# Patient Record
Sex: Female | Born: 1965 | Race: White | Hispanic: No | Smoking: Current every day smoker
Health system: Southern US, Community
[De-identification: ages and names within clinical notes are randomized; demographics above are authoritative.]

## PROBLEM LIST (undated history)

## (undated) DIAGNOSIS — Z8673 Personal history of transient ischemic attack (TIA), and cerebral infarction without residual deficits: Secondary | ICD-10-CM

## (undated) DIAGNOSIS — K5090 Crohn's disease, unspecified, without complications: Secondary | ICD-10-CM

## (undated) DIAGNOSIS — Z8489 Family history of other specified conditions: Secondary | ICD-10-CM

## (undated) DIAGNOSIS — J449 Chronic obstructive pulmonary disease, unspecified: Secondary | ICD-10-CM

## (undated) DIAGNOSIS — I4711 Inappropriate sinus tachycardia, so stated: Secondary | ICD-10-CM

## (undated) DIAGNOSIS — F329 Major depressive disorder, single episode, unspecified: Secondary | ICD-10-CM

## (undated) DIAGNOSIS — F419 Anxiety disorder, unspecified: Secondary | ICD-10-CM

## (undated) DIAGNOSIS — F4310 Post-traumatic stress disorder, unspecified: Secondary | ICD-10-CM

## (undated) DIAGNOSIS — J4489 Other specified chronic obstructive pulmonary disease: Secondary | ICD-10-CM

## (undated) DIAGNOSIS — I10 Essential (primary) hypertension: Secondary | ICD-10-CM

## (undated) DIAGNOSIS — Z950 Presence of cardiac pacemaker: Secondary | ICD-10-CM

## (undated) DIAGNOSIS — K3184 Gastroparesis: Secondary | ICD-10-CM

## (undated) DIAGNOSIS — Z87442 Personal history of urinary calculi: Secondary | ICD-10-CM

## (undated) DIAGNOSIS — G4733 Obstructive sleep apnea (adult) (pediatric): Secondary | ICD-10-CM

## (undated) DIAGNOSIS — Z973 Presence of spectacles and contact lenses: Secondary | ICD-10-CM

## (undated) DIAGNOSIS — Z8719 Personal history of other diseases of the digestive system: Secondary | ICD-10-CM

## (undated) DIAGNOSIS — R102 Pelvic and perineal pain: Secondary | ICD-10-CM

## (undated) DIAGNOSIS — M1990 Unspecified osteoarthritis, unspecified site: Secondary | ICD-10-CM

## (undated) DIAGNOSIS — R000 Tachycardia, unspecified: Secondary | ICD-10-CM

## (undated) DIAGNOSIS — Z87898 Personal history of other specified conditions: Secondary | ICD-10-CM

## (undated) DIAGNOSIS — K219 Gastro-esophageal reflux disease without esophagitis: Secondary | ICD-10-CM

## (undated) DIAGNOSIS — E119 Type 2 diabetes mellitus without complications: Secondary | ICD-10-CM

## (undated) DIAGNOSIS — I495 Sick sinus syndrome: Secondary | ICD-10-CM

## (undated) HISTORY — DX: Tachycardia, unspecified: R00.0

## (undated) HISTORY — DX: Essential (primary) hypertension: I10

## (undated) HISTORY — PX: CARDIAC PACEMAKER PLACEMENT: SHX583

## (undated) HISTORY — PX: CARDIAC CATHETERIZATION: SHX172

## (undated) HISTORY — DX: Post-traumatic stress disorder, unspecified: F43.10

## (undated) HISTORY — DX: Inappropriate sinus tachycardia, so stated: I47.11

## (undated) HISTORY — PX: CARDIOVASCULAR STRESS TEST: SHX262

## (undated) HISTORY — DX: Type 2 diabetes mellitus without complications: E11.9

## (undated) HISTORY — PX: OTHER SURGICAL HISTORY: SHX169

## (undated) HISTORY — DX: Personal history of transient ischemic attack (TIA), and cerebral infarction without residual deficits: Z86.73

## (undated) HISTORY — DX: Anxiety disorder, unspecified: F41.9

## (undated) HISTORY — PX: ABDOMINAL HYSTERECTOMY: SHX81

## (undated) HISTORY — PX: MULTIPLE EXTRACTIONS WITH ALVEOLOPLASTY: SHX5342

---

## 1992-09-23 HISTORY — PX: CHOLECYSTECTOMY: SHX55

## 1999-09-07 ENCOUNTER — Encounter (INDEPENDENT_AMBULATORY_CARE_PROVIDER_SITE_OTHER): Payer: Self-pay | Admitting: Specialist

## 1999-09-07 ENCOUNTER — Other Ambulatory Visit: Admission: RE | Admit: 1999-09-07 | Discharge: 1999-09-07 | Payer: Self-pay | Admitting: Otolaryngology

## 1999-09-09 ENCOUNTER — Emergency Department (HOSPITAL_COMMUNITY): Admission: EM | Admit: 1999-09-09 | Discharge: 1999-09-09 | Payer: Self-pay | Admitting: Emergency Medicine

## 2000-01-01 ENCOUNTER — Encounter: Payer: Self-pay | Admitting: Emergency Medicine

## 2000-01-01 ENCOUNTER — Emergency Department (HOSPITAL_COMMUNITY): Admission: EM | Admit: 2000-01-01 | Discharge: 2000-01-01 | Payer: Self-pay | Admitting: Emergency Medicine

## 2001-05-04 ENCOUNTER — Ambulatory Visit (HOSPITAL_COMMUNITY): Admission: RE | Admit: 2001-05-04 | Discharge: 2001-05-04 | Payer: Self-pay | Admitting: Cardiology

## 2001-05-04 ENCOUNTER — Encounter: Payer: Self-pay | Admitting: Cardiology

## 2001-05-06 ENCOUNTER — Encounter: Payer: Self-pay | Admitting: Internal Medicine

## 2001-05-06 ENCOUNTER — Ambulatory Visit (HOSPITAL_COMMUNITY): Admission: RE | Admit: 2001-05-06 | Discharge: 2001-05-06 | Payer: Self-pay | Admitting: Internal Medicine

## 2001-05-07 ENCOUNTER — Ambulatory Visit (HOSPITAL_COMMUNITY): Admission: RE | Admit: 2001-05-07 | Discharge: 2001-05-07 | Payer: Self-pay | Admitting: Cardiology

## 2001-05-07 ENCOUNTER — Encounter: Payer: Self-pay | Admitting: Cardiology

## 2001-05-08 ENCOUNTER — Ambulatory Visit (HOSPITAL_COMMUNITY): Admission: RE | Admit: 2001-05-08 | Discharge: 2001-05-08 | Payer: Self-pay | Admitting: *Deleted

## 2001-05-10 ENCOUNTER — Emergency Department (HOSPITAL_COMMUNITY): Admission: EM | Admit: 2001-05-10 | Discharge: 2001-05-10 | Payer: Self-pay | Admitting: Emergency Medicine

## 2001-06-10 ENCOUNTER — Emergency Department (HOSPITAL_COMMUNITY): Admission: EM | Admit: 2001-06-10 | Discharge: 2001-06-10 | Payer: Self-pay | Admitting: *Deleted

## 2001-06-26 ENCOUNTER — Ambulatory Visit (HOSPITAL_COMMUNITY): Admission: RE | Admit: 2001-06-26 | Discharge: 2001-06-26 | Payer: Self-pay | Admitting: Internal Medicine

## 2001-07-19 ENCOUNTER — Emergency Department (HOSPITAL_COMMUNITY): Admission: EM | Admit: 2001-07-19 | Discharge: 2001-07-19 | Payer: Self-pay | Admitting: Emergency Medicine

## 2001-07-19 ENCOUNTER — Encounter: Payer: Self-pay | Admitting: Emergency Medicine

## 2001-08-17 ENCOUNTER — Ambulatory Visit (HOSPITAL_COMMUNITY): Admission: RE | Admit: 2001-08-17 | Discharge: 2001-08-17 | Payer: Self-pay | Admitting: Internal Medicine

## 2001-10-16 ENCOUNTER — Ambulatory Visit (HOSPITAL_COMMUNITY): Admission: RE | Admit: 2001-10-16 | Discharge: 2001-10-16 | Payer: Self-pay | Admitting: Internal Medicine

## 2001-10-16 ENCOUNTER — Encounter: Payer: Self-pay | Admitting: Internal Medicine

## 2002-06-21 ENCOUNTER — Encounter: Payer: Self-pay | Admitting: Emergency Medicine

## 2002-06-21 ENCOUNTER — Emergency Department (HOSPITAL_COMMUNITY): Admission: EM | Admit: 2002-06-21 | Discharge: 2002-06-21 | Payer: Self-pay | Admitting: Emergency Medicine

## 2002-11-25 ENCOUNTER — Encounter (INDEPENDENT_AMBULATORY_CARE_PROVIDER_SITE_OTHER): Payer: Self-pay | Admitting: *Deleted

## 2002-11-25 ENCOUNTER — Ambulatory Visit (HOSPITAL_BASED_OUTPATIENT_CLINIC_OR_DEPARTMENT_OTHER): Admission: RE | Admit: 2002-11-25 | Discharge: 2002-11-25 | Payer: Self-pay | Admitting: Otolaryngology

## 2002-11-25 HISTORY — PX: TONSILLECTOMY: SUR1361

## 2003-01-05 ENCOUNTER — Encounter (INDEPENDENT_AMBULATORY_CARE_PROVIDER_SITE_OTHER): Payer: Self-pay | Admitting: *Deleted

## 2003-02-22 ENCOUNTER — Ambulatory Visit (HOSPITAL_COMMUNITY): Admission: RE | Admit: 2003-02-22 | Discharge: 2003-02-22 | Payer: Self-pay | Admitting: Internal Medicine

## 2003-03-02 ENCOUNTER — Ambulatory Visit (HOSPITAL_COMMUNITY): Admission: RE | Admit: 2003-03-02 | Discharge: 2003-03-03 | Payer: Self-pay | Admitting: Internal Medicine

## 2003-11-22 HISTORY — PX: OTHER SURGICAL HISTORY: SHX169

## 2004-01-05 ENCOUNTER — Encounter (INDEPENDENT_AMBULATORY_CARE_PROVIDER_SITE_OTHER): Payer: Self-pay | Admitting: *Deleted

## 2004-02-01 ENCOUNTER — Ambulatory Visit (HOSPITAL_COMMUNITY): Admission: RE | Admit: 2004-02-01 | Discharge: 2004-02-01 | Payer: Self-pay | Admitting: Urology

## 2004-02-02 ENCOUNTER — Ambulatory Visit (HOSPITAL_COMMUNITY): Admission: RE | Admit: 2004-02-02 | Discharge: 2004-02-02 | Payer: Self-pay | Admitting: Urology

## 2004-03-14 ENCOUNTER — Ambulatory Visit (HOSPITAL_COMMUNITY): Admission: RE | Admit: 2004-03-14 | Discharge: 2004-03-14 | Payer: Self-pay | Admitting: Orthopedic Surgery

## 2004-03-21 ENCOUNTER — Ambulatory Visit (HOSPITAL_COMMUNITY): Admission: RE | Admit: 2004-03-21 | Discharge: 2004-03-21 | Payer: Self-pay | Admitting: Orthopedic Surgery

## 2004-04-11 ENCOUNTER — Ambulatory Visit (HOSPITAL_COMMUNITY): Admission: RE | Admit: 2004-04-11 | Discharge: 2004-04-11 | Payer: Self-pay | Admitting: Orthopedic Surgery

## 2004-04-11 ENCOUNTER — Ambulatory Visit (HOSPITAL_BASED_OUTPATIENT_CLINIC_OR_DEPARTMENT_OTHER): Admission: RE | Admit: 2004-04-11 | Discharge: 2004-04-11 | Payer: Self-pay | Admitting: Orthopedic Surgery

## 2004-06-26 ENCOUNTER — Encounter: Payer: Self-pay | Admitting: Internal Medicine

## 2004-09-30 ENCOUNTER — Emergency Department (HOSPITAL_COMMUNITY): Admission: EM | Admit: 2004-09-30 | Discharge: 2004-09-30 | Payer: Self-pay | Admitting: Emergency Medicine

## 2004-10-10 ENCOUNTER — Ambulatory Visit (HOSPITAL_BASED_OUTPATIENT_CLINIC_OR_DEPARTMENT_OTHER): Admission: RE | Admit: 2004-10-10 | Discharge: 2004-10-10 | Payer: Self-pay | Admitting: Orthopedic Surgery

## 2004-10-10 ENCOUNTER — Ambulatory Visit (HOSPITAL_COMMUNITY): Admission: RE | Admit: 2004-10-10 | Discharge: 2004-10-10 | Payer: Self-pay | Admitting: Orthopedic Surgery

## 2005-05-04 ENCOUNTER — Emergency Department (HOSPITAL_COMMUNITY): Admission: EM | Admit: 2005-05-04 | Discharge: 2005-05-04 | Payer: Self-pay | Admitting: Emergency Medicine

## 2005-05-17 ENCOUNTER — Emergency Department (HOSPITAL_COMMUNITY): Admission: EM | Admit: 2005-05-17 | Discharge: 2005-05-17 | Payer: Self-pay | Admitting: *Deleted

## 2005-05-29 ENCOUNTER — Ambulatory Visit (HOSPITAL_BASED_OUTPATIENT_CLINIC_OR_DEPARTMENT_OTHER): Admission: RE | Admit: 2005-05-29 | Discharge: 2005-05-29 | Payer: Self-pay | Admitting: Orthopedic Surgery

## 2005-05-29 ENCOUNTER — Ambulatory Visit (HOSPITAL_COMMUNITY): Admission: RE | Admit: 2005-05-29 | Discharge: 2005-05-29 | Payer: Self-pay | Admitting: Orthopedic Surgery

## 2005-06-05 ENCOUNTER — Ambulatory Visit: Payer: Self-pay

## 2005-07-12 ENCOUNTER — Ambulatory Visit (HOSPITAL_BASED_OUTPATIENT_CLINIC_OR_DEPARTMENT_OTHER): Admission: RE | Admit: 2005-07-12 | Discharge: 2005-07-12 | Payer: Self-pay | Admitting: Orthopedic Surgery

## 2005-07-12 ENCOUNTER — Ambulatory Visit (HOSPITAL_COMMUNITY): Admission: RE | Admit: 2005-07-12 | Discharge: 2005-07-12 | Payer: Self-pay | Admitting: Orthopedic Surgery

## 2005-07-16 ENCOUNTER — Ambulatory Visit: Payer: Self-pay | Admitting: Cardiology

## 2005-07-19 ENCOUNTER — Encounter: Payer: Self-pay | Admitting: Internal Medicine

## 2005-08-07 ENCOUNTER — Encounter: Payer: Self-pay | Admitting: Internal Medicine

## 2005-12-16 ENCOUNTER — Ambulatory Visit (HOSPITAL_COMMUNITY): Admission: RE | Admit: 2005-12-16 | Discharge: 2005-12-16 | Payer: Self-pay | Admitting: Orthopedic Surgery

## 2006-06-20 ENCOUNTER — Ambulatory Visit (HOSPITAL_COMMUNITY): Admission: RE | Admit: 2006-06-20 | Discharge: 2006-06-20 | Payer: Self-pay | Admitting: Orthopedic Surgery

## 2006-07-02 ENCOUNTER — Ambulatory Visit: Payer: Self-pay | Admitting: Cardiology

## 2006-07-28 ENCOUNTER — Ambulatory Visit: Payer: Self-pay | Admitting: Cardiology

## 2006-08-18 ENCOUNTER — Ambulatory Visit: Payer: Self-pay | Admitting: Internal Medicine

## 2006-09-30 ENCOUNTER — Ambulatory Visit: Payer: Self-pay | Admitting: Cardiology

## 2006-11-07 ENCOUNTER — Emergency Department (HOSPITAL_COMMUNITY): Admission: EM | Admit: 2006-11-07 | Discharge: 2006-11-07 | Payer: Self-pay | Admitting: Emergency Medicine

## 2006-12-01 ENCOUNTER — Ambulatory Visit: Payer: Self-pay | Admitting: Internal Medicine

## 2006-12-03 ENCOUNTER — Emergency Department (HOSPITAL_COMMUNITY): Admission: EM | Admit: 2006-12-03 | Discharge: 2006-12-03 | Payer: Self-pay | Admitting: Emergency Medicine

## 2007-01-28 ENCOUNTER — Ambulatory Visit: Payer: Self-pay | Admitting: Internal Medicine

## 2007-03-09 ENCOUNTER — Ambulatory Visit (HOSPITAL_COMMUNITY): Admission: RE | Admit: 2007-03-09 | Discharge: 2007-03-09 | Payer: Self-pay | Admitting: Orthopedic Surgery

## 2007-05-14 ENCOUNTER — Emergency Department (HOSPITAL_COMMUNITY): Admission: EM | Admit: 2007-05-14 | Discharge: 2007-05-14 | Payer: Self-pay | Admitting: Emergency Medicine

## 2007-07-16 ENCOUNTER — Ambulatory Visit: Payer: Self-pay | Admitting: Cardiology

## 2007-07-16 ENCOUNTER — Observation Stay (HOSPITAL_COMMUNITY): Admission: AD | Admit: 2007-07-16 | Discharge: 2007-07-17 | Payer: Self-pay | Admitting: Cardiology

## 2007-07-20 ENCOUNTER — Ambulatory Visit: Payer: Self-pay

## 2007-07-29 ENCOUNTER — Ambulatory Visit: Payer: Self-pay | Admitting: Cardiology

## 2007-07-29 ENCOUNTER — Ambulatory Visit: Payer: Self-pay | Admitting: Internal Medicine

## 2007-09-24 DIAGNOSIS — Z950 Presence of cardiac pacemaker: Secondary | ICD-10-CM

## 2007-09-24 HISTORY — DX: Presence of cardiac pacemaker: Z95.0

## 2007-10-15 ENCOUNTER — Ambulatory Visit: Payer: Self-pay | Admitting: Internal Medicine

## 2007-10-16 ENCOUNTER — Ambulatory Visit: Payer: Self-pay | Admitting: Internal Medicine

## 2007-10-16 ENCOUNTER — Ambulatory Visit (HOSPITAL_COMMUNITY): Admission: RE | Admit: 2007-10-16 | Discharge: 2007-10-17 | Payer: Self-pay | Admitting: Internal Medicine

## 2007-10-18 ENCOUNTER — Emergency Department (HOSPITAL_COMMUNITY): Admission: EM | Admit: 2007-10-18 | Discharge: 2007-10-18 | Payer: Self-pay | Admitting: Emergency Medicine

## 2007-10-20 ENCOUNTER — Ambulatory Visit: Payer: Self-pay | Admitting: Internal Medicine

## 2007-10-28 ENCOUNTER — Ambulatory Visit: Payer: Self-pay

## 2007-11-05 ENCOUNTER — Ambulatory Visit: Payer: Self-pay | Admitting: Internal Medicine

## 2007-12-19 ENCOUNTER — Emergency Department (HOSPITAL_COMMUNITY): Admission: EM | Admit: 2007-12-19 | Discharge: 2007-12-19 | Payer: Self-pay | Admitting: Emergency Medicine

## 2008-01-12 ENCOUNTER — Ambulatory Visit: Payer: Self-pay | Admitting: Internal Medicine

## 2008-02-25 ENCOUNTER — Ambulatory Visit: Payer: Self-pay | Admitting: Cardiology

## 2008-02-29 ENCOUNTER — Ambulatory Visit: Payer: Self-pay | Admitting: Cardiology

## 2008-02-29 LAB — CONVERTED CEMR LAB
ALT: 21 units/L (ref 0–35)
Albumin: 3.7 g/dL (ref 3.5–5.2)
HDL: 41.2 mg/dL (ref >39.0)
Total Bilirubin: 0.6 mg/dL (ref 0.3–1.2)
Triglycerides: 147 mg/dL (ref 0–149)
VLDL: 29 mg/dL (ref 0–40)

## 2008-04-25 ENCOUNTER — Ambulatory Visit (HOSPITAL_COMMUNITY): Admission: RE | Admit: 2008-04-25 | Discharge: 2008-04-25 | Payer: Self-pay | Admitting: Orthopedic Surgery

## 2008-05-05 ENCOUNTER — Encounter: Admission: RE | Admit: 2008-05-05 | Discharge: 2008-06-20 | Payer: Self-pay | Admitting: Orthopedic Surgery

## 2008-09-01 ENCOUNTER — Ambulatory Visit: Payer: Self-pay | Admitting: Physician Assistant

## 2008-09-08 ENCOUNTER — Encounter (INDEPENDENT_AMBULATORY_CARE_PROVIDER_SITE_OTHER): Payer: Self-pay | Admitting: *Deleted

## 2008-09-08 ENCOUNTER — Emergency Department (HOSPITAL_COMMUNITY): Admission: EM | Admit: 2008-09-08 | Discharge: 2008-09-08 | Payer: Self-pay | Admitting: Emergency Medicine

## 2008-09-26 ENCOUNTER — Encounter: Admission: RE | Admit: 2008-09-26 | Discharge: 2008-12-25 | Payer: Self-pay | Admitting: Physician Assistant

## 2008-09-30 ENCOUNTER — Ambulatory Visit: Payer: Self-pay | Admitting: Cardiology

## 2008-10-04 ENCOUNTER — Encounter: Payer: Self-pay | Admitting: Gastroenterology

## 2008-10-05 ENCOUNTER — Telehealth: Payer: Self-pay | Admitting: Gastroenterology

## 2008-10-05 ENCOUNTER — Ambulatory Visit: Payer: Self-pay | Admitting: Gastroenterology

## 2008-10-17 ENCOUNTER — Telehealth: Payer: Self-pay | Admitting: Gastroenterology

## 2008-10-18 ENCOUNTER — Ambulatory Visit: Payer: Self-pay | Admitting: Gastroenterology

## 2008-10-18 ENCOUNTER — Telehealth: Payer: Self-pay | Admitting: Gastroenterology

## 2008-10-18 ENCOUNTER — Encounter: Payer: Self-pay | Admitting: Gastroenterology

## 2008-10-18 ENCOUNTER — Ambulatory Visit (HOSPITAL_COMMUNITY): Admission: RE | Admit: 2008-10-18 | Discharge: 2008-10-18 | Payer: Self-pay | Admitting: Gastroenterology

## 2008-10-21 ENCOUNTER — Ambulatory Visit (HOSPITAL_COMMUNITY): Admission: RE | Admit: 2008-10-21 | Discharge: 2008-10-21 | Payer: Self-pay | Admitting: Gastroenterology

## 2008-10-21 ENCOUNTER — Encounter: Payer: Self-pay | Admitting: Gastroenterology

## 2008-10-24 ENCOUNTER — Telehealth: Payer: Self-pay | Admitting: Gastroenterology

## 2008-10-26 ENCOUNTER — Ambulatory Visit: Payer: Self-pay | Admitting: Gastroenterology

## 2008-10-26 ENCOUNTER — Telehealth: Payer: Self-pay | Admitting: Gastroenterology

## 2008-10-27 ENCOUNTER — Telehealth: Payer: Self-pay | Admitting: Gastroenterology

## 2008-11-02 ENCOUNTER — Observation Stay (HOSPITAL_COMMUNITY): Admission: RE | Admit: 2008-11-02 | Discharge: 2008-11-02 | Payer: Self-pay | Admitting: Gastroenterology

## 2008-11-02 ENCOUNTER — Encounter: Payer: Self-pay | Admitting: Gastroenterology

## 2008-11-23 ENCOUNTER — Ambulatory Visit: Payer: Self-pay | Admitting: Internal Medicine

## 2008-12-02 ENCOUNTER — Telehealth: Payer: Self-pay | Admitting: Gastroenterology

## 2008-12-12 ENCOUNTER — Ambulatory Visit: Payer: Self-pay | Admitting: Gastroenterology

## 2008-12-12 LAB — CONVERTED CEMR LAB
BUN: 9 mg/dL (ref 6–23)
Creatinine, Ser: 0.7 mg/dL (ref 0.4–1.2)

## 2008-12-14 ENCOUNTER — Encounter: Payer: Self-pay | Admitting: Gastroenterology

## 2008-12-14 ENCOUNTER — Ambulatory Visit: Payer: Self-pay | Admitting: Cardiology

## 2008-12-30 ENCOUNTER — Encounter (INDEPENDENT_AMBULATORY_CARE_PROVIDER_SITE_OTHER): Payer: Self-pay | Admitting: Radiology

## 2009-01-31 ENCOUNTER — Telehealth: Payer: Self-pay | Admitting: Internal Medicine

## 2009-02-02 ENCOUNTER — Ambulatory Visit: Payer: Self-pay | Admitting: Internal Medicine

## 2009-02-15 ENCOUNTER — Ambulatory Visit: Payer: Self-pay | Admitting: Cardiology

## 2009-02-22 ENCOUNTER — Encounter (INDEPENDENT_AMBULATORY_CARE_PROVIDER_SITE_OTHER): Payer: Self-pay | Admitting: *Deleted

## 2009-03-01 ENCOUNTER — Encounter (INDEPENDENT_AMBULATORY_CARE_PROVIDER_SITE_OTHER): Payer: Self-pay | Admitting: *Deleted

## 2009-03-01 LAB — CONVERTED CEMR LAB
ALT: 31 units/L
AST: 23 units/L
Albumin: 4.2 g/dL
Alkaline Phosphatase: 107 units/L
Total Protein: 7.1 g/dL
Triglycerides: 355 mg/dL

## 2009-03-06 ENCOUNTER — Encounter: Payer: Self-pay | Admitting: Physician Assistant

## 2009-03-15 ENCOUNTER — Telehealth: Payer: Self-pay | Admitting: Gastroenterology

## 2009-03-16 ENCOUNTER — Ambulatory Visit: Payer: Self-pay | Admitting: Gastroenterology

## 2009-03-16 DIAGNOSIS — K3184 Gastroparesis: Secondary | ICD-10-CM

## 2009-03-16 DIAGNOSIS — K219 Gastro-esophageal reflux disease without esophagitis: Secondary | ICD-10-CM | POA: Insufficient documentation

## 2009-03-21 ENCOUNTER — Telehealth: Payer: Self-pay | Admitting: Gastroenterology

## 2009-03-24 ENCOUNTER — Encounter: Payer: Self-pay | Admitting: Cardiology

## 2009-04-11 ENCOUNTER — Telehealth: Payer: Self-pay | Admitting: Gastroenterology

## 2009-04-13 ENCOUNTER — Telehealth: Payer: Self-pay | Admitting: Gastroenterology

## 2009-04-17 ENCOUNTER — Ambulatory Visit: Payer: Self-pay | Admitting: Gastroenterology

## 2009-04-24 ENCOUNTER — Telehealth (INDEPENDENT_AMBULATORY_CARE_PROVIDER_SITE_OTHER): Payer: Self-pay | Admitting: *Deleted

## 2009-05-09 ENCOUNTER — Ambulatory Visit: Payer: Self-pay | Admitting: Gastroenterology

## 2009-05-10 ENCOUNTER — Ambulatory Visit: Payer: Self-pay | Admitting: Gastroenterology

## 2009-05-10 ENCOUNTER — Encounter: Payer: Self-pay | Admitting: Gastroenterology

## 2009-05-11 ENCOUNTER — Ambulatory Visit: Payer: Self-pay | Admitting: Cardiology

## 2009-05-11 DIAGNOSIS — R079 Chest pain, unspecified: Secondary | ICD-10-CM | POA: Insufficient documentation

## 2009-05-11 DIAGNOSIS — I10 Essential (primary) hypertension: Secondary | ICD-10-CM

## 2009-05-18 ENCOUNTER — Ambulatory Visit: Payer: Self-pay | Admitting: Cardiology

## 2009-05-18 ENCOUNTER — Encounter (HOSPITAL_COMMUNITY): Admission: RE | Admit: 2009-05-18 | Discharge: 2009-06-17 | Payer: Self-pay | Admitting: Cardiology

## 2009-05-22 ENCOUNTER — Encounter: Payer: Self-pay | Admitting: Cardiology

## 2009-05-23 ENCOUNTER — Encounter: Payer: Self-pay | Admitting: Gastroenterology

## 2009-05-26 ENCOUNTER — Ambulatory Visit (HOSPITAL_COMMUNITY): Admission: RE | Admit: 2009-05-26 | Discharge: 2009-05-26 | Payer: Self-pay | Admitting: Orthopedic Surgery

## 2009-06-28 ENCOUNTER — Encounter: Payer: Self-pay | Admitting: Gastroenterology

## 2009-06-28 DIAGNOSIS — K222 Esophageal obstruction: Secondary | ICD-10-CM

## 2009-07-06 ENCOUNTER — Encounter: Payer: Self-pay | Admitting: Cardiology

## 2009-07-10 ENCOUNTER — Telehealth: Payer: Self-pay | Admitting: Gastroenterology

## 2009-07-25 ENCOUNTER — Telehealth: Payer: Self-pay | Admitting: Gastroenterology

## 2009-08-14 ENCOUNTER — Ambulatory Visit (HOSPITAL_COMMUNITY): Admission: RE | Admit: 2009-08-14 | Discharge: 2009-08-14 | Payer: Self-pay | Admitting: Orthopedic Surgery

## 2009-08-24 ENCOUNTER — Ambulatory Visit: Payer: Self-pay | Admitting: Gastroenterology

## 2009-08-24 ENCOUNTER — Telehealth: Payer: Self-pay | Admitting: Gastroenterology

## 2009-08-25 ENCOUNTER — Telehealth: Payer: Self-pay | Admitting: Gastroenterology

## 2009-08-25 ENCOUNTER — Telehealth (INDEPENDENT_AMBULATORY_CARE_PROVIDER_SITE_OTHER): Payer: Self-pay | Admitting: *Deleted

## 2009-08-29 ENCOUNTER — Ambulatory Visit: Payer: Self-pay | Admitting: Internal Medicine

## 2009-08-29 DIAGNOSIS — Z950 Presence of cardiac pacemaker: Secondary | ICD-10-CM

## 2009-09-04 ENCOUNTER — Telehealth: Payer: Self-pay | Admitting: Gastroenterology

## 2009-09-12 ENCOUNTER — Ambulatory Visit: Payer: Self-pay | Admitting: Internal Medicine

## 2009-09-12 ENCOUNTER — Telehealth: Payer: Self-pay | Admitting: Gastroenterology

## 2009-09-27 ENCOUNTER — Ambulatory Visit: Payer: Self-pay | Admitting: Gastroenterology

## 2009-09-28 ENCOUNTER — Telehealth: Payer: Self-pay | Admitting: Gastroenterology

## 2009-09-29 ENCOUNTER — Encounter: Payer: Self-pay | Admitting: Gastroenterology

## 2009-10-06 ENCOUNTER — Telehealth: Payer: Self-pay | Admitting: Gastroenterology

## 2009-10-11 ENCOUNTER — Telehealth: Payer: Self-pay | Admitting: Gastroenterology

## 2009-10-18 ENCOUNTER — Ambulatory Visit: Payer: Self-pay | Admitting: Vascular Surgery

## 2009-10-18 ENCOUNTER — Emergency Department (HOSPITAL_COMMUNITY): Admission: EM | Admit: 2009-10-18 | Discharge: 2009-10-18 | Payer: Self-pay | Admitting: Emergency Medicine

## 2009-10-18 ENCOUNTER — Encounter (INDEPENDENT_AMBULATORY_CARE_PROVIDER_SITE_OTHER): Payer: Self-pay | Admitting: Emergency Medicine

## 2009-10-23 ENCOUNTER — Ambulatory Visit: Payer: Self-pay | Admitting: Gastroenterology

## 2009-10-23 DIAGNOSIS — K589 Irritable bowel syndrome without diarrhea: Secondary | ICD-10-CM | POA: Insufficient documentation

## 2009-10-31 ENCOUNTER — Telehealth (INDEPENDENT_AMBULATORY_CARE_PROVIDER_SITE_OTHER): Payer: Self-pay | Admitting: *Deleted

## 2009-11-02 ENCOUNTER — Encounter: Payer: Self-pay | Admitting: Internal Medicine

## 2009-11-03 ENCOUNTER — Ambulatory Visit: Payer: Self-pay | Admitting: Internal Medicine

## 2009-11-03 DIAGNOSIS — R0989 Other specified symptoms and signs involving the circulatory and respiratory systems: Secondary | ICD-10-CM | POA: Insufficient documentation

## 2009-11-03 DIAGNOSIS — F17200 Nicotine dependence, unspecified, uncomplicated: Secondary | ICD-10-CM | POA: Insufficient documentation

## 2009-11-03 DIAGNOSIS — R0609 Other forms of dyspnea: Secondary | ICD-10-CM | POA: Insufficient documentation

## 2009-11-03 DIAGNOSIS — J441 Chronic obstructive pulmonary disease with (acute) exacerbation: Secondary | ICD-10-CM | POA: Insufficient documentation

## 2009-11-07 ENCOUNTER — Telehealth: Payer: Self-pay | Admitting: Internal Medicine

## 2009-11-13 ENCOUNTER — Ambulatory Visit: Payer: Self-pay | Admitting: Internal Medicine

## 2009-11-13 ENCOUNTER — Encounter (INDEPENDENT_AMBULATORY_CARE_PROVIDER_SITE_OTHER): Payer: Self-pay | Admitting: *Deleted

## 2009-11-14 ENCOUNTER — Telehealth (INDEPENDENT_AMBULATORY_CARE_PROVIDER_SITE_OTHER): Payer: Self-pay | Admitting: *Deleted

## 2009-11-14 ENCOUNTER — Telehealth: Payer: Self-pay | Admitting: Internal Medicine

## 2009-11-14 DIAGNOSIS — G4733 Obstructive sleep apnea (adult) (pediatric): Secondary | ICD-10-CM | POA: Insufficient documentation

## 2009-11-15 ENCOUNTER — Encounter: Payer: Self-pay | Admitting: Adult Health

## 2009-11-17 ENCOUNTER — Telehealth (INDEPENDENT_AMBULATORY_CARE_PROVIDER_SITE_OTHER): Payer: Self-pay | Admitting: *Deleted

## 2009-11-20 ENCOUNTER — Ambulatory Visit: Payer: Self-pay | Admitting: Cardiology

## 2009-11-20 DIAGNOSIS — E782 Mixed hyperlipidemia: Secondary | ICD-10-CM | POA: Insufficient documentation

## 2009-12-05 ENCOUNTER — Telehealth (INDEPENDENT_AMBULATORY_CARE_PROVIDER_SITE_OTHER): Payer: Self-pay | Admitting: *Deleted

## 2009-12-05 ENCOUNTER — Ambulatory Visit (HOSPITAL_COMMUNITY): Admission: RE | Admit: 2009-12-05 | Discharge: 2009-12-05 | Payer: Self-pay | Admitting: Urology

## 2009-12-11 ENCOUNTER — Ambulatory Visit: Payer: Self-pay | Admitting: Internal Medicine

## 2009-12-11 DIAGNOSIS — J449 Chronic obstructive pulmonary disease, unspecified: Secondary | ICD-10-CM

## 2009-12-11 DIAGNOSIS — J4489 Other specified chronic obstructive pulmonary disease: Secondary | ICD-10-CM | POA: Insufficient documentation

## 2009-12-18 ENCOUNTER — Encounter: Payer: Self-pay | Admitting: Cardiology

## 2009-12-19 ENCOUNTER — Telehealth: Payer: Self-pay | Admitting: Internal Medicine

## 2009-12-20 ENCOUNTER — Encounter (INDEPENDENT_AMBULATORY_CARE_PROVIDER_SITE_OTHER): Payer: Self-pay | Admitting: *Deleted

## 2009-12-21 ENCOUNTER — Telehealth: Payer: Self-pay | Admitting: Gastroenterology

## 2009-12-22 ENCOUNTER — Encounter: Payer: Self-pay | Admitting: Internal Medicine

## 2009-12-22 ENCOUNTER — Telehealth (INDEPENDENT_AMBULATORY_CARE_PROVIDER_SITE_OTHER): Payer: Self-pay | Admitting: *Deleted

## 2009-12-25 ENCOUNTER — Encounter: Payer: Self-pay | Admitting: Internal Medicine

## 2009-12-25 ENCOUNTER — Ambulatory Visit: Payer: Self-pay | Admitting: Gastroenterology

## 2010-01-04 ENCOUNTER — Telehealth: Payer: Self-pay | Admitting: Gastroenterology

## 2010-02-21 ENCOUNTER — Encounter: Payer: Self-pay | Admitting: Gastroenterology

## 2010-02-22 ENCOUNTER — Encounter (INDEPENDENT_AMBULATORY_CARE_PROVIDER_SITE_OTHER): Payer: Self-pay | Admitting: *Deleted

## 2010-03-30 ENCOUNTER — Telehealth: Payer: Self-pay | Admitting: Internal Medicine

## 2010-03-30 ENCOUNTER — Encounter: Payer: Self-pay | Admitting: Internal Medicine

## 2010-05-04 ENCOUNTER — Encounter (INDEPENDENT_AMBULATORY_CARE_PROVIDER_SITE_OTHER): Payer: Self-pay | Admitting: *Deleted

## 2010-05-14 ENCOUNTER — Emergency Department (HOSPITAL_COMMUNITY): Admission: EM | Admit: 2010-05-14 | Discharge: 2010-05-14 | Payer: Self-pay | Admitting: Emergency Medicine

## 2010-05-30 ENCOUNTER — Telehealth: Payer: Self-pay | Admitting: Gastroenterology

## 2010-07-26 ENCOUNTER — Emergency Department (HOSPITAL_COMMUNITY): Admission: EM | Admit: 2010-07-26 | Discharge: 2010-07-26 | Payer: Self-pay | Admitting: Emergency Medicine

## 2010-08-02 ENCOUNTER — Ambulatory Visit: Payer: Self-pay | Admitting: Cardiology

## 2010-08-22 ENCOUNTER — Ambulatory Visit: Payer: Self-pay | Admitting: Cardiology

## 2010-08-22 ENCOUNTER — Telehealth: Payer: Self-pay | Admitting: Internal Medicine

## 2010-08-22 DIAGNOSIS — E1159 Type 2 diabetes mellitus with other circulatory complications: Secondary | ICD-10-CM

## 2010-08-23 ENCOUNTER — Encounter: Payer: Self-pay | Admitting: Cardiology

## 2010-08-27 ENCOUNTER — Telehealth: Payer: Self-pay | Admitting: Adult Health

## 2010-09-06 ENCOUNTER — Encounter (INDEPENDENT_AMBULATORY_CARE_PROVIDER_SITE_OTHER): Payer: Self-pay | Admitting: *Deleted

## 2010-09-12 ENCOUNTER — Emergency Department (HOSPITAL_COMMUNITY)
Admission: EM | Admit: 2010-09-12 | Discharge: 2010-09-12 | Payer: Self-pay | Source: Home / Self Care | Admitting: Emergency Medicine

## 2010-10-10 ENCOUNTER — Telehealth: Payer: Self-pay | Admitting: Gastroenterology

## 2010-10-14 ENCOUNTER — Encounter: Payer: Self-pay | Admitting: Gastroenterology

## 2010-10-16 ENCOUNTER — Ambulatory Visit: Admit: 2010-10-16 | Payer: Self-pay | Admitting: Internal Medicine

## 2010-10-21 LAB — CONVERTED CEMR LAB
Basophils Relative: 0.1 % (ref 0.0–1.0)
CO2: 27 meq/L (ref 19–32)
Eosinophils Relative: 2 % (ref 0.0–5.0)
GFR calc Af Amer: 119 mL/min
Glucose, Bld: 183 mg/dL — ABNORMAL HIGH (ref 70–99)
HCT: 44.7 % (ref 36.0–46.0)
Hemoglobin: 15.5 g/dL — ABNORMAL HIGH (ref 12.0–15.0)
Lymphocytes Relative: 39 % (ref 12.0–46.0)
Monocytes Absolute: 0.6 10*3/uL (ref 0.2–0.7)
Neutro Abs: 4.6 10*3/uL (ref 1.4–7.7)
Potassium: 4.4 meq/L (ref 3.5–5.1)
Prothrombin Time: 11.7 s (ref 10.9–13.3)
TSH: 0.62 microintl units/mL (ref 0.35–5.50)
WBC: 8.8 10*3/uL (ref 4.5–10.5)

## 2010-10-25 NOTE — Progress Notes (Signed)
Summary: diag  Phone Note Call from Patient Call back at Home Phone [phone]   Caller: Patient Call For: Jolita Haefner Reason for Call: Talk to Nurse Summary of Call: pt would like nurse to call her, said that she wasn't told what was wrong with her at her visit and she would like to know ehat MR's findings were. Initial call taken by: Eugene Gavia,  November 07, 2009 3:16 PM  Follow-up for Phone Call        told pt according to OV note she has COPD. She states she saw her PMD today and he asked why she was not on oxygen. I advised pt that was why we walked her was to check her oxygen level and she did not drop, so according to that she does not need oxygen at this time. She also states that she is having trouble sleeping. This was also mentioned in last OV note and staes will f/u at next OV. Pt states she used to be on CPAP, but stopped it because it "scared" her?. She staets she had a sleep study at [facility], I advised to get a copy of this study as well as Xrays from hospital visit the sent her to see [place] and bring them by for MR to review. Pt states she will do so. She also states her PMD changed some of her meds. I advised she is scheduled to see TP on 2/21 for med calender and she needs to bring all of her meds then and TP will fix her med list. pt states understanding.Carron Curie CMA  November 07, 2009 3:38 PM

## 2010-10-25 NOTE — Letter (Signed)
Summary: Appointment - Reschedule  [facility], Main Office  [address]   [place], [place] [address]   Phone: [phone]  Fax: [phone]     September 06, 2010 MRN: [id number]   [name] [address] [place], [place]  [address]   Dear [name],   Due to a change in our office schedule, your appointment on  1.24.12 with Dr. [name],  must be changed.  It is very important that we reach you to reschedule this appointment. We look forward to participating in your health care needs. Please contact us at the number listed above at your earliest convenience to reschedule this appointment.     Sincerely,  Glass blower/designer

## 2010-10-25 NOTE — Progress Notes (Signed)
Summary: [facility]   Imported By: Sherian Rein 04/04/2010 10:34:07  _____________________________________________________________________  External Attachment:    Type:   Image     Comment:   External Document

## 2010-10-25 NOTE — Letter (Signed)
Summary: Appointment - Missed  [facility], Main Office  [address]   [place], [place] [address]   Phone: [phone]  Fax: [phone]     February 22, 2010 MRN: [id number]   [name] [address] [place], [place]  [address]   Dear Ms. Tata,  Our records indicate you missed your appointment on 02-13-10 with Dr [name]. It is very important that we reach you to reschedule this appointment. We look forward to participating in your health care needs. Please contact us at the number listed above at your earliest convenience to reschedule this appointment.     Sincerely,   Ruel Favors Scheduling Team

## 2010-10-25 NOTE — Assessment & Plan Note (Signed)
Summary: [facility]   Visit Type:  Follow-up Primary Provider:  Dr. Lia Hopping   History of Present Illness: 45 year old woman presents for followup. I last saw her back in February of this year. She was recently admitted to [facility] with chest pain and shortness of breath. She ruled out for myocardial infarction by cardiac markers and underwent a Lexus scan Cardiolite on 10 November that demonstrated no evidence of ischemia with LV EF 56%. Cardiology was not involved in her hospital stay. She had very poor blood sugar control with glucose in the 300s at presentation, although she states that she has been compliant with her medications. Potassium was noted to be 3.2, reportedly not on Lasix, although she has used this as needed for lower extremity swelling.  She states that she is due to see Dr. Olena Leatherwood back in the office next week. She has not had regular followup with Pulmonary medicine.  I reviewed the patient's recent stress test results with her. I continue to recommend better efforts at aggressive risk factor modification. She has never had any objective evidence of obstructive CAD based on a multitude of testing over the years.  Current Medications (verified): 1)  Ipratropium Bromide 0.06 % Soln (Ipratropium Bromide) .... Inhale 1 Vial Via Hhn Four Times A Day 2)  Pindolol 10 Mg Tabs (Pindolol) .... Take 1 Tab Two Times A Day 3)  Aspirin Ec 325 Mg Tbec (Aspirin) .... Take 1 Tab By Mouth At Bedtime 4)  Lasix 20 Mg Tabs (Furosemide) .... Once Daily and As Needed As Needed Swelling 5)  Zocor 20 Mg Tabs (Simvastatin) .Marland Kitchen.. 1 At Bedtime 6)  Niaspan 1000 Mg Cr-Tabs (Niacin (Antihyperlipidemic)) .... Take 1 Tab By Mouth At Bedtime 7)  Glucovance 5-500 Mg Tabs (Glyburide-Metformin) .Marland Kitchen.. 1 Tablet By Mouth Two Times A Day 8)  Zegerid 20-1100 Mg Caps (Omeprazole-Sodium Bicarbonate) .... Take 1 Capsule By Mouth Two Times A Day 30 Minutes Before Meal 9)  Metoclopramide Hcl 10 Mg  Tabs (Metoclopramide Hcl) .... Take One By Mouth 30 Minutes Before Breakfast, Lunch, Dinner and At Bedtime. 10)  Neurontin 300 Mg Caps (Gabapentin) .... Three Times A Day 11)  Zyrtec Allergy 10 Mg Tabs (Cetirizine Hcl) .Marland Kitchen.. 1 Tab By Mouth At Bedtime 12)  Tizanidine Hcl 4 Mg Tabs (Tizanidine Hcl) .... 3 Tabs By Mouth At Bedtime 13)  Levemir 100 Unit/ml Soln (Insulin Detemir) .... 40 Units With Breakfast and At Bedtime 14)  Novolog 100 Unit/ml Soln (Insulin Aspart) .Marland Kitchen.. 15 Units Before Each Meal 15)  Nitro-Dur 0.4 Mg/hr Pt24 (Nitroglycerin) .... As Needed 16)  Albuterol Sulfate (2.5 Mg/44ml) 0.083% Nebu (Albuterol Sulfate) .Marland Kitchen.. 1 Neb Via Hhn Every 4 Hours As Needed 17)  Ventolin Hfa 108 (90 Base) Mcg/act Aers (Albuterol Sulfate) .... 2 Puffs Every 4 Hours As Needed 18)  Methocarbamol 750 Mg Tabs (Methocarbamol) .Marland Kitchen.. 1 Every 8 Hours As Needed 19)  Hyomax-Sr 0.375 Mg Xr12h-Tab (Hyoscyamine Sulfate) .... Takes One Tab Twice A Day As Needed For Abdominal Pain 20)  Promethazine Hcl 25 Mg Tabs (Promethazine Hcl) .... Take One By Mouth Every 4 Hours As Needed For Nausea/vomiting. 21)  Lomotil 2.5-0.025 Mg Tabs (Diphenoxylate-Atropine) .Marland Kitchen.. 1 Tab By Mouth Every 8 Hours As Needed 22)  Oxygen .... 2 L At Bedtime 23)  Byetta 10 Mcg Pen 10 Mcg/0.69ml Soln (Exenatide) .Marland Kitchen.. 10 Units Before Meals 24)  Advair Hfa 115-21 Mcg/act Aero (Fluticasone-Salmeterol) .... Take 2 Puffs Two Times A Day 25)  Imdur 30 Mg Xr24h-Tab (Isosorbide  Mononitrate) .... Take 1 Tab Daily 26)  Potassium Chloride 20 Meq Pack (Potassium Chloride) .... Take 1 Tab Daily  Allergies: 1)  ! 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 [name]   Review of Systems       The patient complains of dyspnea on exertion and peripheral edema.  The patient denies anorexia, fever, prolonged cough, headaches, hemoptysis, melena, and hematochezia.         Reports no recent chest pain. Otherwise reviewed and negative.  Vital Signs:  Patient profile:   45 year old female Weight:      200 pounds BMI:     31.44 Pulse rate:   97 / minute BP sitting:   171 / 90  (right arm)  Vitals Entered By: Dreama Saa, CNA (August 22, 2010 1:53 PM)  Physical Exam  Additional Exam:  Obese woman in no acute distress. HEENT: Conjunctiva and lids normal, oropharynx with poor dentition. Neck: Supple, no elevated jugular venous pressure or carotid bruits. Lungs: Diminished breath sounds, no wheezing, and nonlabored. Cardiac: Regular rate and rhythm, no S3. Abdomen: Soft, nontender, bowel sounds present. Extremities: 1+ edema below the knees with venous stasis, distal pulses one plus. Skin: Warm and dry. Musculoskeletal: No gross deformities. Neuropsychiatric: Alert and oriented x3, affect appropriate.   Nuclear Study  Procedure date:  05/18/2009  Findings:      Scintigraphic Data: Analysis of the raw perfusion data shows   somewhat limited radiotracer uptake, affected by body habitus, and   breast attenuation/soft tissue attenuation artifact.    Tomographic views were obtained using the short axis, vertical long   axis, and horizontal long axis planes.  There are small, mild   intensity, defects in the apical anteroseptal and mid inferior    wall, consistent with attenuation artifact.  These areas are fixed.   There are no large reversible perfusion defects to suggest   ischemia.    Gated imaging reveals a left ventricular ejection fraction of 59%   without wall motion abnormality.  The end-diastolic volume is 91   and the end-systolic volume is 37.  Transient ischemic dilatation   ratio is 1.12.    IMPRESSION:   Probably normal Lexiscan Myoview as outlined.  There were no   diagnostic ST-segment changes.  There is evidence of breast   tissue/soft tissue attenuation with small, mild intensity, apical   anteroseptal and mid inferior wall defects that are fixed.  There   are no reversible perfusion defects to indicate ischemia.  Left   ventricle ejection fraction is normal at 59% without focal wall   motion abnormalities.  Nuclear Study  Procedure date:  08/02/2010  Findings:      Lexiscan Cardiolite done at [facility] demonstrated no diagnostic ST segment changes, no significant perfusion defects, LVEF of 56% with normal wall motion.  PPM Specifications Following MD:  Sharlot Gowda  [name], MD     PPM Vendor:  [facility] [employer]     PPM Model Number:  [id number]     PPM Serial Number:  [id number] PPM DOI:  10/16/2007     PPM Implanting MD:  Lewayne Bunting, MD  Lead 1    Location: RV     DOI: 10/16/2007     Model #: [id number]     Serial #: [id number]     Status: active   Indications:  SYMTOMATIC BRADY   PPM Follow Up Pacer Dependent:  No      Episodes Coumadin:  No  Parameters Mode:  DDDR     Lower Rate Limit:  60     Upper Rate Limit:  120 Paced AV Delay:  300     Sensed AV Delay:  260  Impression & Recommendations:  Problem # 1:  CHEST PAIN (ICD-786.50)  No evidence of obstructive CAD based on a variety of testing over the years, including a recent Lexiscan Cardiolite done at [facility] in early November. I reviewed this the patient today. I continue to recommend more aggressive efforts at risk factor  modification. Endothelial dysfunction is certainly a possibility in light of her comorbid illnesses. Not certain if the recent addition of Imdur will make much difference clinically. Keep regular followup with Dr. Olena Leatherwood, we can see her back over the next 6 months.  The following medications were removed from the medication list:    Nitrostat 0.4 Mg Subl (Nitroglycerin) .Marland Kitchen... 1 under tongue every 5 minutes x3 as needed chest pain Her updated medication list for this problem includes:    Pindolol 10 Mg Tabs (Pindolol) .Marland Kitchen... Take 1 tab two times a day    Aspirin Ec 325 Mg Tbec (Aspirin) .Marland Kitchen... Take 1 tab by mouth at bedtime    Nitro-dur 0.4 Mg/hr Pt24 (Nitroglycerin) .Marland Kitchen... As needed    Imdur 30 Mg Xr24h-tab (Isosorbide mononitrate) .Marland Kitchen... Take 1 tab daily  Problem # 2:  HYPERTENSION, BENIGN ESSENTIAL (ICD-401.1)  Blood pressure not well controlled today. Medications were reviewed. We discussed weight loss and sodium restriction. No changes were made today.  Her updated medication list for this problem includes:    Pindolol 10 Mg Tabs (Pindolol) .Marland Kitchen... Take 1 tab two times a day    Aspirin Ec 325 Mg Tbec (Aspirin) .Marland Kitchen... Take 1 tab by mouth at bedtime    Lasix 20 Mg Tabs (Furosemide) ..... Once daily and as needed as needed swelling  Problem # 3:  DIABETES MELLITUS, TYPE II (ICD-250.00)  Poorly controlled. She states she plans to discuss with Dr. Olena Leatherwood the possible referral to an endocrinologist.  Her updated medication list for this problem includes:    Aspirin Ec 325 Mg Tbec (Aspirin) .Marland Kitchen... Take 1 tab by mouth at bedtime    Glucovance 5-500 Mg Tabs (Glyburide-metformin) .Marland Kitchen... 1 tablet by mouth two times a day    Levemir 100 Unit/ml Soln (Insulin detemir) .Marland KitchenMarland KitchenMarland KitchenMarland Kitchen 40 units with breakfast and at bedtime    Novolog 100 Unit/ml Soln (Insulin aspart) .Marland KitchenMarland KitchenMarland KitchenMarland Kitchen 15 units before each meal    Byetta 10 Mcg Pen 10 Mcg/0.34ml Soln (Exenatide) .Marland KitchenMarland KitchenMarland KitchenMarland Kitchen 10 units before meals  Patient Instructions: 1)  Your  physician recommends that you schedule a follow-up appointment in: 6 months 2)  Your physician recommends that you continue on your current medications as directed. Please refer to the Current Medication list given to you today.

## 2010-10-25 NOTE — Miscellaneous (Signed)
Summary: LV Arterial Doppler/Venous  Clinical Lists Changes  Observations: Added new observation of LEA DUPLEX:   --------------------------------------------------------------------   Summary:    - No evidence of deep vein or superficial thrombosis involving the     right lower extremity.   - No evidence of Baker's cyst on the right.   Other specific details can be found in the table(s) above.  Prepared   and Electronically Authenticated by    Josephina Gip, MD   2011-01-26T15:31:[id number]  (10/18/2009 9:44)      Arterial Doppler  Procedure date:  10/18/2009  Findings:        --------------------------------------------------------------------   Summary:    - No evidence of deep vein or superficial thrombosis involving the     right lower extremity.   - No evidence of Baker's cyst on the right.   Other specific details can be found in the table(s) above.  Prepared   and Electronically Authenticated by    Josephina Gip, MD   2011-01-26T15:31:[id number]

## 2010-10-25 NOTE — Procedures (Signed)
Summary: Oximetry/Oxygen Qualifying Services  Oximetry/Oxygen Qualifying Services   Imported By: Sherian Rein 12/28/2009 10:43:47  _____________________________________________________________________  External Attachment:    Type:   Image     Comment:   External Document

## 2010-10-25 NOTE — Progress Notes (Signed)
Summary: Triage  Phone Note Call from Patient Call back at Home Phone [phone]   Caller: Patient Call For: Dr. Arlyce Dice Reason for Call: Talk to Nurse Summary of Call: Pt is calling about her lab results Initial call taken by: Karna Christmas,  October 06, 2009 4:40 PM  Follow-up for Phone Call        pt notified of normal lab results.  She would like to know when she is to come back for a follow up.  Follow-up by: Chales Abrahams CMA Duncan Dull),  October 06, 2009 4:50 PM  Additional Follow-up for Phone Call Additional follow up Details #1::        Once we get her prometheus results you can schedule a f/u appt. Additional Follow-up by: Louis Meckel MD,  October 10, 2009 9:27 AM    Additional Follow-up for Phone Call Additional follow up Details #2::    Prometheus report is back. OV scheduled for 10-23-09 at 2:30pm. Pt. instructed to call back as needed.  Follow-up by: Laureen Ochs LPN,  October 10, 2009 9:36 AM

## 2010-10-25 NOTE — Progress Notes (Signed)
Summary: TRIAGE  Phone Note Call from Patient Call back at Home Phone [phone]   Caller: Patient Call For: [facility] Reason for Call: Talk to Nurse Complaint: Breathing Problems Summary of Call: Patient has questions regarding a letter that Dr Arlyce Dice was suppose to do for her lawyer. Initial call taken by: Tawni Levy,  October 11, 2009 10:49 AM  Follow-up for Phone Call        No answer, I will try back later. Laureen Ochs LPN  October 11, 2009 10:53 AM   Pt. states she needs a letter from Dr.Kaplan, for her lawyer, that states due to her health issues she may NOT cross the border to [place].   [facility] PLEASE ADVISE  Follow-up by: Laureen Ochs LPN,  October 11, 2009 12:33 PM  Additional Follow-up for Phone Call Additional follow up Details #1::        Letter was dictated and signed Additional Follow-up by: Louis Meckel MD,  October 12, 2009 10:33 AM    Additional Follow-up for Phone Call Additional follow up Details #2::    Pt. will pick-up the letter today. Pt. instructed to call back as needed.  Follow-up by: Laureen Ochs LPN,  October 12, 2009 10:38 AM

## 2010-10-25 NOTE — Progress Notes (Signed)
Summary: sur clearance letter  Phone Note From Other Clinic Call back at [phone]   Caller: [facility] Request: Talk with Nurse Summary of Call: pt needs to have lithropritisy procedure done and needs clearence fax [phone] Initial call taken by: Faythe Ghee,  December 05, 2009 1:39 PM  Follow-up for Phone Call        We need an official request letter/form with procedure indicated and anticipated timing.  She should have no major contraindication from a cardiac (coronary) perspective.  She has a pacemaker and will likely need to have this interrogated after procedure to ensure appropriate device function.  Would check with the EP team (Dr. Lubertha Basque patient) nregarding this. Follow-up by: Loreli Slot, MD, [facility],  December 09, 2009 8:37 PM  Additional Follow-up for Phone Call Additional follow up Details #1::        T.J., who schedules for [facility] is not in today, was advised to call her tomorrow to ask for official request letter for surgery. Additional Follow-up by: Larita Fife Via LPN,  December 12, 2009 11:46 AM    Additional Follow-up for Phone Call Additional follow up Details #2::    DRS. [name] and Diona Browner have gave clearance for this pt. and the document will be scanned into the record Follow-up by: Teressa Lower RN,  December 14, 2009 2:38 PM

## 2010-10-25 NOTE — Progress Notes (Signed)
Summary: triage  Phone Note Call from Patient Call back at Home Phone [phone]   Caller: Patient Call For: [name] Reason for Call: Talk to Nurse Summary of Call: Patient wants to be seen asap do to stomach pain thinks it can be a flare up of Crohns. Initial call taken by: Tawni Levy,  December 21, 2009 4:26 PM  Follow-up for Phone Call        Given appt. with NP for tomorrow in aft.Offered am appt.but couldn't come..Started having lower quadrant abd. pain and diarrhea yesterday. Follow-up by: Teryl Lucy RN,  December 21, 2009 4:48 PM

## 2010-10-25 NOTE — Progress Notes (Signed)
Summary: fluid gain/sooner appt  Phone Note Call from Patient Call back at [phone]   Caller: pt Reason for Call: Talk to Nurse Summary of Call: pt has fluid gain and states that her PCP wants her to be seen sooner the 11/20/09 appt with dr Diona Browner, Samara Deist doesnt have anything sooner and she wants to know should she try to get in with Dr [name]. Initial call taken by: Faythe Ghee,  November 14, 2009 3:02 PM  Follow-up for Phone Call        Dr.  Polly Cobia spoke with Dr. [name] who ordered extra doses furosemide, pt instructed to go to ED if s/s do not improve, to see [facility] on Monday 11/20/2009 Follow-up by: Teressa Lower RN,  November 14, 2009 4:27 PM

## 2010-10-25 NOTE — Procedures (Signed)
Summary: Capsule Endoscopy Report / [facility]  Capsule Endoscopy Report / [facility]   Imported By: Lennie Odor 12/13/2009 11:56:52  _____________________________________________________________________  External Attachment:    Type:   Image     Comment:   External Document

## 2010-10-25 NOTE — Miscellaneous (Signed)
  Clinical Lists Changes  Medications: Added new medication of HYOMAX-SR 0.375 MG XR12H-TAB (HYOSCYAMINE SULFATE) takes one tab twice a day as needed for abdominal pain - Signed Rx of HYOMAX-SR 0.375 MG XR12H-TAB (HYOSCYAMINE SULFATE) takes one tab twice a day as needed for abdominal pain;  #25 x 2;  Signed;  Entered by: Louis Meckel MD;  Authorized by: Louis Meckel MD;  Method used: Print then Give to Patient    Prescriptions: HYOMAX-SR 0.375 MG XR12H-TAB (HYOSCYAMINE SULFATE) takes one tab twice a day as needed for abdominal pain  #25 x 2   Entered and Authorized by:   Louis Meckel MD   Signed by:   Louis Meckel MD on 09/27/2009   Method used:   Print then Give to Patient   RxID:   [id number]

## 2010-10-25 NOTE — Letter (Signed)
Summary: Appointment - Missed  [facility], Main Office  [address]   [place], [place] [address]   Phone: [phone]  Fax: [phone]     May 04, 2010 MRN: [id number]   KIANNAH GRUNOW [address] [place], [place]  [address]   Dear Ms. Fitch,  Our records indicate you missed your appointment on 05/01/10  with Dr.  [name].                                    It is very important that we reach you to reschedule this appointment. We look forward to participating in your health care needs. Please contact us at the number listed above at your earliest convenience to reschedule this appointment.     Sincerely,    Glass blower/designer

## 2010-10-25 NOTE — Progress Notes (Signed)
Summary: reglan refill  Phone Note Refill Request Call back at Home Phone [phone] Message from:  Fax from Pharmacy on May 30, 2010 9:13 AM  Refills Requested: Medication #1:  METOCLOPRAMIDE HCL 10 MG TABS Take one by mouth 30 minutes before breakfast   Dosage confirmed as above?Dosage Confirmed   Brand Name Necessary? No   Supply Requested: 3 months Initial call taken by: Merri Ray CMA Duncan Dull),  May 30, 2010 9:13 AM    New/Updated Medications: METOCLOPRAMIDE HCL 10 MG TABS (METOCLOPRAMIDE HCL) Take one by mouth 30 minutes before breakfast, lunch, dinner and at bedtime. Prescriptions: METOCLOPRAMIDE HCL 10 MG TABS (METOCLOPRAMIDE HCL) Take one by mouth 30 minutes before breakfast, lunch, dinner and at bedtime.  #120 x 3   Entered by:   Merri Ray CMA (AAMA)   Authorized by:   Louis Meckel MD   Signed by:   Merri Ray CMA (AAMA) on 05/30/2010   Method used:   Printed then faxed to ...       [facility] (retail)       [address]       [place], [place]  [address]       Ph: [phone]       Fax: [phone]   RxID:   [id number]

## 2010-10-25 NOTE — Progress Notes (Signed)
Summary: results-awaiting fax  Phone Note Call from Patient Call back at [phone]   Caller: Patient Call For: parrett Summary of Call: calling for overnite oximetry results Initial call taken by: Rickard Patience,  November 17, 2009 9:43 AM  Follow-up for Phone Call        I called [facility] and requested ono results be faxed to triage fax att: Victorino Dike so I can pass these on to TP. Carron Curie CMA  November 17, 2009 10:10 AM  received ONO results, Shanda Bumps has them to give to TP for review. Carron Curie CMA  November 17, 2009 10:20 AM   Pt called back...wants someone to call hr today.  [phone]  Follow-up by: Eugene Gavia,  November 17, 2009 2:40 PM  Additional Follow-up for Phone Call Additional follow up Details #1::        Will forward to TP as FYI.  Gweneth Dimitri RN  November 17, 2009 3:06 PM   per TP: does not qualify for o2.  pt aware and verbalized her understanding.  pt did also ask about her sleep study results.  informed pt that MR has her results and will discuss them at her next OV.  pt verbalized her understanding with this as well. Additional Follow-up by: Boone Master CNA,  November 17, 2009 4:30 PM

## 2010-10-25 NOTE — Assessment & Plan Note (Signed)
Summary: 2-3 weeks/apc   Visit Type:  Follow-up Copy to:  n/a Primary Provider/Referring Provider:  Lia Hopping, MD  CC:  1 month follow up.  pt states she is still wheezing - worse with activity.  states she does have a prod cough with thick and yellow mucus.  states she is down to 2 cig/day.  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 Heavy Tobacco Abuse, Obesity, OSA (non compliant with CPAP x 2007, no assigned sleep dic), multifactorial dyspnea, and multiple medical problems.   ov 12/11/2009: Last seen as new patient 11/03/2009. Treated for AECOPD at that visit. Followup 11/13/2009 saw NP for med calendar. Was doing better at that time but declined to quit smoking. PAst 1 week reports  worsening dyspnea, cough, sputum volume and change in color of sputum from white to yellow. Still smoking bu says she is down to 2 cig/day. Of note a) she is denying that I gave her dx of COPD in past. I reiterated this to her this time; b) she is cheduled for ureteral stone removal on 12/20/2009 and is wondering if it would be safe for her to undergo procedure.; c) she was on lisinopirl  - but stopped it one  week ago.      Current Medications (verified): 1)  Advair Diskus 250-50 Mcg/dose Aepb (Fluticasone-Salmeterol) .... One Puff Twice Daily 2)  Ipratropium Bromide 0.06 % Soln (Ipratropium Bromide) .... Inhale 1 Vial Via Hhn Four Times A Day 3)  Pindolol 10 Mg Tabs (Pindolol) .... Take 1 Tab Two Times A Day 4)  Aspirin Ec 325 Mg Tbec (Aspirin) .... Take 1 Tab By Mouth At Bedtime 5)  Lasix 20 Mg Tabs (Furosemide) .... Once Daily and As Needed As Needed Swelling 6)  Zocor 20 Mg Tabs (Simvastatin) .[name].. 1 At Bedtime 7)  Niaspan 1000 Mg Cr-Tabs (Niacin (Antihyperlipidemic)) .... Take 1 Tab By Mouth At Bedtime 8)  Lisinopril 20 Mg Tabs (Lisinopril) .... Take 1  Tablet By Mouth Once A Day 9)  Glucovance 5-500 Mg Tabs (Glyburide-Metformin) .[name].. 1 Tablet By Mouth Two Times A Day 10)  Zegerid 20-1100 Mg Caps (Omeprazole-Sodium Bicarbonate) .... Take 1 Capsule By Mouth Two Times A Day 30 Minutes Before Meal 11)  Metoclopramide Hcl 10 Mg Tabs (Metoclopramide Hcl) .... Take One By Mouth 30 Minutes Before Breakfast, Lunch, Dinner and At Bedtime. 12)  Neurontin 300 Mg Caps (Gabapentin) .... Three Times A Day 13)  Zyrtec Allergy 10 Mg Tabs (Cetirizine Hcl) .[name].. 1 Tab By Mouth At Bedtime 14)  Tizanidine Hcl 4 Mg Tabs (Tizanidine Hcl) .... 3 Tabs By Mouth At Bedtime 15)  Levemir 100 Unit/ml Soln (Insulin Detemir) .... 40 Units With Breakfast and At Bedtime 16)  Novolog 100 Unit/ml Soln (Insulin Aspart) .[name].. 15 Units Before Each Meal 17)  Nitro-Dur 0.4 Mg/hr Pt24 (Nitroglycerin) .... As Needed 18)  Albuterol Sulfate (2.5 Mg/73ml) 0.083% Nebu (Albuterol Sulfate) .[name].. 1 Neb Via Hhn Every 4 Hours As Needed 19)  Ventolin Hfa 108 (90 Base) Mcg/act Aers (Albuterol Sulfate) .... 2 Puffs Every 4 Hours As Needed 20)  Methocarbamol 750 Mg Tabs (Methocarbamol) .[name].. 1 Every 8 Hours As Needed 21)  Hyomax-Sr 0.375 Mg Xr12h-Tab (Hyoscyamine Sulfate) .... Takes One Tab Twice A Day As Needed For Abdominal Pain 22)  Promethazine Hcl 25 Mg Tabs (Promethazine Hcl) .... Take One By Mouth Every  4 Hours As Needed For Nausea/vomiting. 23)  Nitrostat 0.4 Mg Subl (Nitroglycerin) .[name].. 1 Under Tongue Every 5 Minutes X3 As Needed Chest Pain 24)  Lomotil 2.5-0.025 Mg Tabs (Diphenoxylate-Atropine) .[name].. 1 Tab By Mouth Every 8 Hours As Needed 25)  Oxygen .... 2 L At Bedtime 26)  Byetta 10 Mcg Pen 10 Mcg/0.14ml Soln (Exenatide) .[name].. 10 Units Before Meals  Allergies (verified): 1)  ! Ampicillin 2)  Flexeril  Past History:  Family History: Last updated: 12/11/2009 No FH of Colon Cancer: Family History of Diabetes: Mother, Brother, Sister Family History of Heart Disease: Father, deceased MI,  Mother, 2 Brother, Sister, Nephew Dad - died of emphysema - Was patient of Dr. 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 Asthma--COPD  Diabetes GERD Hypertension Hypothyroidism Kidney Stones Sleep Apnea  Past Surgical History: Reviewed history from 02/13/2009 and no changes required. C-Section x 2 Cholecystectomy Knee Arthroscopy Pacemaker cardiac cath 1999  Family History: No FH of Colon Cancer: Family History of Diabetes: Mother, Brother, Sister Family History of Heart Disease: Father, deceased MI, Mother, 2 Brother, Sister, Nephew Dad - died of emphysema - Was patient of Dr. Sherene Sires (updated 12/11/2009)  Social History: Reviewed history from 11/13/2009 and no changes required. 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  The patient denies coughing up blood, chest pain, irregular heartbeats, sore throat, tooth/dental problems, headaches, nasal congestion/difficulty breathing through nose, itching, ear ache, anxiety, depression, hand/feet swelling, rash, and fever.    Vital Signs:  Patient profile:   45 year old  female Height:      67 inches Weight:      248.50 pounds BMI:     39.06 O2 Sat:      99 % Temp:     97.6 degrees F oral Pulse rate:   66 / minute BP sitting:   124 / 66  (right arm) Cuff size:   regular  Vitals Entered By: Gweneth Dimitri RN (December 11, 2009 9:49 AM)  Serial Vital Signs/Assessments:  Comments: Ambulatory Pulse Oximetry  Resting; HR__86___    02 Sat_98_%RA___  Lap1 (185 feet)   HR_86____   02 Sat_95%RA___ Lap2 (185 feet)   HR___102__   02 Sat_95%RA___    Lap3 (185 feet)   HR___106__   02 Sat__94%RA___  _x__Test Completed without Difficulty ___Test Stopped due to:  By: Denna Haggard, CMA   CC: 1 month follow up.  pt states she is still wheezing - worse with activity.  states she does have a prod cough with thick, yellow mucus.  states she is down to 2 cig/day.   Comments Medications reviewed with patient Daytime contact number verified with patient. Gweneth Dimitri RN  December 11, 2009 9:50  AM    Physical Exam  General:  obese.  smells of tobacco.  Head:  normocephalic and atraumatic Eyes:  PERRLA/EOM intact; conjunctiva and sclera clear Ears:  TMs intact and clear with normal canals Nose:  no deformity, discharge, inflammation, or lesions Mouth:  no deformity or lesions Neck:  no masses, thyromegaly, or abnormal cervical nodes Chest Wall:  no deformities noted Lungs:  decreased BS bilateral and coarse BS throughout.   no disgtress  Heart:  regular rate and rhythm, S1, S2 without murmurs, rubs, gallops, or clicks Abdomen:  bowel sounds positive; abdomen soft and non-tender without masses, or organomegaly Msk:  no deformity or scoliosis noted with normal posture Pulses:  pulses normal Extremities:  no clubbing, cyanosis, edema, or deformity noted Neurologic:  CN II-XII grossly intact with normal reflexes, coordination, muscle strength and tone Skin:  intact without lesions or rashes Cervical Nodes:  no significant adenopathy Axillary Nodes:  no  significant adenopathy Psych:  alert and cooperative; normal mood and affect; normal attention span and concentration   Impression & Recommendations:  Problem # 1:  CHRONIC OBSTRUCTIVE PULMONARY DISEASE, ACUTE EXACERBATION (ICD-491.21) Assessment Deteriorated  She is having mild AECOPD.  plan doxcycline x 5 days if worsens, she will call and we can do steroids or she should go to er  Orders: Est. Patient Level V (54098)  Problem # 2:  PREOPERATIVE EXAMINATION (ICD-V72.84) Assessment: New  She is scheduled for renal/ureteral stone removal on 12/20/2009. I have cuationed her against it while in midst of AECOPD. However, if she is well by then with doxy then she can have surgery. I will send this note to Dr. Rito Ehrlich urologist in [place]. I personally spoke to Dr. Neoma Laming and updated him on plan  Orders: Tobacco use cessation intermediate 3-10 minutes (99406) Est. Patient Level V (11914)  Problem # 3:  OBSTRUCTIVE SLEEP APNEA (ICD-327.23) Assessment: Comment Only not using cpap fatigued all day snores  plan at followup will refer to Dr. Vassie Loll or Dr Craige Cotta  Problem # 4:  TOBACCO ABUSE (ICD-305.1) Assessment: Improved congratulated on efforts to quit.  counselled for 3 minutes Orders: T- * Misc. Laboratory test [phone]) Tobacco use cessation intermediate 3-10 minutes (99406) Est. Patient Level V (62130)  Problem # 5:  COPD (ICD-496) Assessment: Unchanged plan dc advair disk dc lisinopril - she says she stopped it 1 week ago by PMD start advair 2 puff two times a day start spiriva (inhaler tech taught) refer pulmonary rehab A1AT test send out to [facility] [place] [facility] 2 months  plan dc advair disk dc lisinopril - she says she stopped it 1 week ago by PMD start advair 2 puff two times a day start spiriva  refer pulmonary rehab A1AT test send out to [place] [place] [facility] 2 months  Medications Added to Medication List This Visit: 1)  Advair Hfa 115-21 Mcg/act Aero  (Fluticasone-salmeterol) .... Two puffs twice daily 2)  Oxygen  .... 2 l at bedtime 3)  Byetta 10 Mcg Pen 10 Mcg/0.67ml Soln (Exenatide) .[name].. 10 units before meals 4)  Spiriva Handihaler 18 Mcg Caps (Tiotropium bromide monohydrate) .... Two puffs in handihaler daily 5)  Doxycycline Monohydrate 100 Mg Caps (Doxycycline monohydrate) .... By mouth twice daily after meals  Other Orders: Rehabilitation Referral (Rehab) HFA Instruction [phone])  Patient Instructions: 1)  STOP Lisinopril - we will take it off your med calendar 2)  Change advair disc to puffer 2 puff two times a day 3)  start spiriva  (  stop atrovent neb) 4)  take doxycycline 100mg  by mouth two times a day x 5 days 5)  if you get worse, call us and you might need steroids 6)  make sure your urologist Dr Rito Ehrlich [phone]  call us before your kidney stone procedure 7)  WE will do send out blood test for genetic cause of copd (blood test to [facility] [place]) 8)  I will set you up with pulmonary rehab at [facility] 9)  demonstrate inhaler technique to my nurse - get samples 10)  return to see me in 1 month 11)  have my nurse updated your med calendar in pen Prescriptions: DOXYCYCLINE MONOHYDRATE 100 MG  CAPS (DOXYCYCLINE MONOHYDRATE) By mouth twice daily after meals  #10 x 0   Entered and Authorized by:   Kalman Shan MD   Signed by:   Kalman Shan MD on 12/11/2009   Method used:   Print then Give to Patient   RxID:   [id number] SPIRIVA HANDIHALER 18 MCG  CAPS (TIOTROPIUM BROMIDE MONOHYDRATE) Two puffs in handihaler daily  #1 x 6   Entered and Authorized by:   Kalman Shan MD   Signed by:   Kalman Shan MD on 12/11/2009   Method used:   Print then Give to Patient   RxID:   [id number] ADVAIR HFA 115-21 MCG/ACT  AERO (FLUTICASONE-SALMETEROL) Two puffs twice daily  #1 x 6   Entered and Authorized by:   Kalman Shan MD   Signed by:   Kalman Shan MD on 12/11/2009   Method used:   Print  then Give to Patient   RxID:   [id number]

## 2010-10-25 NOTE — Miscellaneous (Signed)
Summary: LABS LIPIDS,LIVER,03/01/2009  Clinical Lists Changes  Observations: Added new observation of ALBUMIN: 4.2 g/dL (25/42/7062 37:62) Added new observation of PROTEIN, TOT: 7.1 g/dL (83/15/1761 60:73) Added new observation of SGPT (ALT): 31 units/L (03/01/2009 11:51) Added new observation of SGOT (AST): 23 units/L (03/01/2009 11:51) Added new observation of ALK PHOS: 107 units/L (03/01/2009 11:51) Added new observation of BILI DIRECT: 0.1 mg/dL (71/02/2693 85:46) Added new observation of LDL: 74 mg/dL (27/11/5007 38:18) Added new observation of HDL: 50 mg/dL (29/93/7169 67:89) Added new observation of TRIGLYC TOT: 355 mg/dL (38/06/1750 02:58) Added new observation of CHOLESTEROL: 195 mg/dL (52/77/8242 35:36)

## 2010-10-25 NOTE — Progress Notes (Signed)
Summary: refills  Phone Note From Pharmacy Call back at [phone]   Caller: Britta Mccreedy FROM [facility] Call For: [facility]  Summary of Call: Patient needs refills for Diflucan Initial call taken by: Tawni Levy,  January 04, 2010 2:53 PM  Follow-up for Phone Call        i called Britta Mccreedy and advised her that deb had already spoke to [facility] on 12/21/2009 abou tthe rx and patient NS her appt. If she needs rx refilled she will have to make an appt and keep it to discuss with Dr. Arlyce Dice. Britta Mccreedy will advise patient Follow-up by: Harlow Mares CMA [facility]),  January 04, 2010 5:18 PM

## 2010-10-25 NOTE — Assessment & Plan Note (Signed)
Summary: asthma//lmr   Visit Type:  Initial Consult Copy to:  n/a Primary Provider/Referring Provider:  Lia Hopping, MD  CC:  Pt here to follow-up from hospital visit to [facility] in [place]. Marland Kitchen  History of Present Illness: IOV 11/03/2009. New eval. 45 year old female. Active  50 pack smoker. Main complaint is "cannot breathe". Reportedly fine till 4 weeks ago and then developed dyspnea. A week later developed wheeze.  Insidious onset. Slowly progressive. Then, one week ago saw PMD. Was hospitalized. Reportedly needed o2. Discharged 2d later. Was informed that she might have COPD but denies being told she had pneumonia or asthma. Was advised to seek a pulmonlogist help. Recollects being treated with o2, antibiotics, and steroids. At any point in time, denies associated fever, sputum, hemoptysis, chills, night, sweats, nausea., vomit, or change in chronic diarrhea. However, since discharge has dry cough but says there is sputum stuck in throat that she is unable to expectorate.  Denies being discharged on antibiotics or prednisone taper. New medicine since discharge are advair and albuterol. No outside records available.   CardioPerfect Spirometry  ID: [id number] Patient: [name], [name] DOB: 05-15-1966 Age: 45 Years Old Sex: Female Race: White Height: 67 Weight: 254.50 PPD: 1 Status: Unconfirmed Past Medical History:  Asthma Diabetes GERD Hypertension Hypothyroidism Kidney Stones Sleep Apnea  Recorded: 11/03/2009 4:42 PM  Parameter  Measured Predicted %Predicted FVC     1.91        4.02        47.40 FEV1     1.67        3.24        51.60 FEV1%   87.62        81.67        107.30 PEF    2.84        7.38        38.50   Interpretation: poor performance on test. lot of cough. independently reviewed  Current Medications (verified): 1)  Tizanidine Hcl 4 Mg Tabs (Tizanidine Hcl) .... 3 At Bedtime 2)  Zyrtec Allergy 10 Mg Tabs (Cetirizine Hcl) .Marland Kitchen.. 1 At Bedtime 3)  Zocor 20 Mg Tabs  (Simvastatin) .Marland Kitchen.. 1 At Bedtime 4)  Glucovance 5-500 Mg Tabs (Glyburide-Metformin) .Marland Kitchen.. 1 Tablet By Mouth 3 Times A Day 5)  Lasix 20 Mg Tabs (Furosemide) .... Once Daily 6)  Levemir 100 Unit/ml Soln (Insulin Detemir) .... 40 Units [place] Two Times A Day 7)  Advair Diskus 250-50 Mcg/dose Aepb (Fluticasone-Salmeterol) .... One Puff Twice Daily 8)  Nitro-Dur 0.4 Mg/hr Pt24 (Nitroglycerin) .... As Needed 9)  Novolog 100 Unit/ml Soln (Insulin Aspart) .Marland Kitchen.. 15 Units Before Each Meal 10)  Aspirin Ec 325 Mg Tbec (Aspirin) .... Once Daily 11)  Nabumetone 750 Mg Tabs (Nabumetone) .... Take 1 Tablet By Mouth Two Times A Day 12)  Metoclopramide Hcl 10 Mg Tabs (Metoclopramide Hcl) .... Take One By Mouth 30 Minutes Before Breakfast, Lunch, Dinner and At Bedtime. 13)  Niaspan 1000 Mg Cr-Tabs (Niacin (Antihyperlipidemic)) .... Take 2 Tabs By Month Once Daily 14)  Pindolol 10 Mg Tabs (Pindolol) .... Take 1 Tab Two Times A Day 15)  Zegerid 40-1100 Mg Caps (Omeprazole-Sodium Bicarbonate) .... Take One Tab Before Breakfast and Before Retiring 16)  Promethazine Hcl 25 Mg Tabs (Promethazine Hcl) .... Take One By Mouth Every 6 Hours As Needed For Nausea/vomiting. 17)  Lialda 1.2 Gm Tbec (Mesalamine) .... Take 2 Tabs Daily 18)  Ziac 5-6.25 Mg Tabs (Bisoprolol-Hydrochlorothiazide) .... One Tablet By Mouth Once  Daily 19)  Tylenol With Codeine #3 300-30 Mg Tabs (Acetaminophen-Codeine) .... Take By Mouth As Needed Pain 20)  Hyomax-Sr 0.375 Mg Xr12h-Tab (Hyoscyamine Sulfate) .... Takes One Tab Twice A Day As Needed For Abdominal Pain 21)  Neurontin 300 Mg Caps (Gabapentin) .... Three Times A Day 22)  Duoneb 0.5-2.5 (3) Mg/76ml Soln (Ipratropium-Albuterol) .... Four Times A Day  Allergies (verified): 1)  ! 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 Asthma Diabetes GERD Hypertension Hypothyroidism Kidney Stones Sleep Apnea  Past Surgical History: Reviewed history from 02/13/2009 and no changes required. C-Section x 2 Cholecystectomy Knee Arthroscopy Pacemaker cardiac cath 1999  Family History: Reviewed history from 10/05/2008 and no changes required. No FH of Colon Cancer: Family History of Diabetes: Mother, Brother, Sister Family History of Heart Disease: Father, deceased MI, Mother, 2 Brother, Sister, Interior and spatial designer  Social History: Reviewed history from 02/13/2009 and no changes required. 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  The patient denies coughing up blood, chest pain, irregular heartbeats, acid heartburn, indigestion, loss of appetite, weight change, difficulty swallowing, sore throat, tooth/dental problems, nasal congestion/difficulty breathing through nose, sneezing, itching, ear ache, anxiety, depression, joint stiffness or pain, rash, change in color of mucus, and fever.    Vital Signs:  Patient profile:   45 year old female Height:      67 inches Weight:      254.50 pounds O2 Sat:      95 % on Room  air Temp:     98.0 degrees F oral Pulse rate:   98 / minute BP sitting:   114 / 70  (right arm) Cuff size:   regular  Vitals Entered By: Carron Curie CMA (November 03, 2009 4:02 PM)  O2 Flow:  Room air  Serial Vital Signs/Assessments:  Comments: Ambulatory Pulse Oximetry  Resting; HR__97___    02 Sat___96__  Lap1 (185 feet)   HR__100___   02 Sat__94___ Lap2 (185 feet)   HR___102__   02 Sat__93___    Lap3 (185 feet)   HR_____   02 Sat_____  ___Test Completed without Difficulty __x_Test Stopped due to:pt request due to SOB and wheezing   By: Carron Curie CMA    Physical Exam  General:  obese.  smells of tobacco.  Head:  normocephalic and atraumatic Eyes:  PERRLA/EOM intact; conjunctiva and sclera clear Ears:  TMs intact and clear with normal canals Nose:  no deformity, discharge, inflammation, or lesions Mouth:  no deformity or lesions Neck:  no masses, thyromegaly, or abnormal cervical nodes Chest Wall:  no deformities  noted Lungs:  coughs no distress no accessory muscle use bilateral extensive wheeze + Heart:  regular rate and rhythm, S1, S2 without murmurs, rubs, gallops, or clicks Abdomen:  bowel sounds positive; abdomen soft and non-tender without masses, or organomegaly Msk:  no deformity or scoliosis noted with normal posture Pulses:  pulses normal Extremities:  no clubbing, cyanosis, edema, or deformity noted Neurologic:  CN II-XII grossly intact with normal reflexes, coordination, muscle strength and tone Skin:  intact without lesions or rashes Cervical Nodes:  no significant adenopathy Axillary Nodes:  no significant adenopathy Psych:  alert and cooperative; normal mood and affect; normal attention span and concentration   CXR  Procedure date:  05/26/2009  Findings:      CHEST - 2 VIEW    Comparison: 10/17/2007    Findings: A dual lead pacer wire is in place from a left subclavian   approach with tips located in the right atrium and  right ventricle.   Position appears stable.    Heart and mediastinal contours are within normal limits.  The lung   fields appear clear with no evidence for focal infiltrate or   congestive failure.  Bony structures appear intact.    IMPRESSION:   Stable cardiopulmonary appearance with no acute abnormality noted    Read By:  Bertha Stakes,  M.D.   Released By:  Bertha Stakes,  M.D.   Comments:      personally reviewed and agree  CXR  Procedure date:  11/03/2009  Findings:      Clinical Data: COPD    CHEST - 2 VIEW    Comparison: 05/26/2009    Findings: Stable pacemaker.  Normal heart and lungs.    IMPRESSION:   No active cardiopulmonary disease.    Read By:  Jolaine Click,  M.D.   Released By:  Jolaine Click,  M.D.   Comments:      independtly reviewed  Pulmonary Function Test Date: 11/03/2009 4:42 PM Gender: Female  Pre-Spirometry FVC    Value: 1.91 L/min   % Pred: 47.40 % FEV1    Value: 1.67 L     Pred: 3.24 L     % Pred: 51.60 % FEV1/FVC  Value: 87.62 %     % Pred: 107.30 %  Impression & Recommendations:  Problem # 1:  CHRONIC OBSTRUCTIVE PULMONARY DISEASE, ACUTE EXACERBATION (ICD-491.21) Assessment New CLinically is in moderate AE-COPD. Clincally does not seem to need admission. Suspect lack of prednisone taper and antibiotics at discharge from outside hospital 5d ago as primary etiology for current AE-COPD  plan neb in office IM depot medrol 80mg  x 1 in office 2 week prednisone taper 5d doxycycline rov 7 days for med calednar Orders: T-2 View CXR (71020TC) Consultation Level V (16109)  Problem # 2:  SNORING (ICD-786.09) Assessment: Comment Only  well evaluate in detail at followup Her updated medication list for this problem includes:    Lasix 20 Mg Tabs (Furosemide) ..... Once daily    Advair Diskus 250-50 Mcg/dose Aepb (Fluticasone-salmeterol) ..... One puff twice daily    Pindolol 10 Mg Tabs (Pindolol) .Marland Kitchen... Take 1 tab two times  a day    Ziac 5-6.25 Mg Tabs (Bisoprolol-hydrochlorothiazide) ..... One tablet by mouth once daily    Duoneb 0.5-2.5 (3) Mg/20ml Soln (Ipratropium-albuterol) .Marland Kitchen... Four times a day  Orders: Consultation Level V (60454)  Problem # 3:  TOBACCO ABUSE (ICD-305.1) Assessment: New  advised to quit. She is aware of harmful effects of smoking. 3  minute counselling done. Will take it up in detail oon followup  Orders: Consultation Level V (78295)  Medications Added to Medication List This Visit: 1)  Advair Diskus 250-50 Mcg/dose Aepb (Fluticasone-salmeterol) .... One puff twice daily 2)  Duoneb 0.5-2.5 (3) Mg/56ml Soln (Ipratropium-albuterol) .... Four times a day 3)  Doxycycline Monohydrate 100 Mg Caps (Doxycycline monohydrate) .... By mouth twice daily after meals 4)  Prednisone 10 Mg Tabs (Prednisone) .... 6 tabs daily x3 days, then 4 tabs daily x3 days, then 3 tab daily x 3 days, then 2 tabs daily x3 days, then 1 tab daily x3 days, then stop  Patient Instructions: 1)  Please take doxycycline 100mg  by mouth two times a day after meals  2)  You cold have nausea and vomitting with this medicine so take after meals. IF you stil have problems, call us 3)  Take 2 weeks prednisone taper 4)  Take albuterol and ipratropium nebulizer mixed every 4h for 1-2 days, then take it every six hours for another 1-2 days 5)  After that, take albuterol nebulizer as needed only but continue your iprtaropium nebulizer every six hours scheduled 6)  Return to see Nurse Tammy in 7 days for followup and med calendar 7)  If you get worse in between, go to ER Prescriptions: PREDNISONE 10 MG  TABS (PREDNISONE) 6 tabs daily x3 days, then 4 tabs daily x3 days, then 3 tab daily x 3 days, then 2 tabs daily x3 days, then 1 tab daily x3 days, then stop  #48 x 0   Entered and Authorized by:   Kalman Shan MD   Signed by:   Kalman Shan MD on 11/03/2009   Method used:   Print then Give to Patient   RxID:    [id number] DOXYCYCLINE MONOHYDRATE 100 MG  CAPS (DOXYCYCLINE MONOHYDRATE) By mouth twice daily after meals  #10 x 0   Entered and Authorized by:   Kalman Shan MD   Signed by:   Kalman Shan MD on 11/03/2009   Method used:   Print then Give to Patient   RxID:   [id number]   Appended Document: asthma//lmr    Clinical Lists Changes  Orders: Added new Service order of Admin of Therapeutic Inj  intramuscular or subcutaneous (36644) - Signed Added new Service order of Depo- Medrol 80mg  (J1040) - Signed Added new Service order of Nebulizer Tx (03474) - Signed       Medication Administration  Injection # 1:    Medication: Depo- Medrol 80mg     Diagnosis: CHRONIC OBSTRUCTIVE PULMONARY DISEASE, ACUTE EXACERBATION (ICD-491.21)    Route: IM    Site: RUOQ gluteus    Exp Date: 10/11    Lot #: [id number]    Mfr: teva    Patient tolerated injection without complications    Given by: Carron Curie CMA (November 07, 2009 11:17 AM)  Medication # 1:    Medication: EMR miscellaneous medications    Diagnosis: CHRONIC OBSTRUCTIVE PULMONARY DISEASE, ACUTE EXACERBATION (ICD-491.21)    Dose: 1 vial    Route: inhaled    Exp Date: 11/11    Lot #: [id number]    Mfr: nephron    Comments: albuterol 0.083%    Patient tolerated medication without complications    Given by: Carron Curie CMA (November 07, 2009 11:17 AM)  Orders Added: 1)  Admin of Therapeutic Inj  intramuscular or subcutaneous [96372] 2)  Depo- Medrol 80mg  [J1040] 3)  Nebulizer Tx [95188]

## 2010-10-25 NOTE — Letter (Signed)
SummaryScience writer Pulmonary Care Appointment Letter  [facility]  [address]   [place], [place] [address]   Phone: [phone]  Fax: [phone]    03/30/2010 MRN: [id number]  [name] [address] [place], [place]  [address]  Dear Ms. Chronis,   Our office is attempting to contact you about an appointment.  Please call our office at [phone] to schedule this appointment with [facility] Also, your Medical records are available for pick up at the front desk.  Our registration staff is prepared to assist you with any questions you may have.    Thank you,      Nature conservation officer Pulmonary Division

## 2010-10-25 NOTE — Progress Notes (Signed)
Summary: wants sooner apt  Phone Note Call from Patient   Caller: Patient Summary of Call: patient was seen at [facility] hospital and wants to come see [place] for a follow up to get established with any of our doctors. she was told she has asthama or somekind of lung problem. she is wheezing very hard and wants to be seen sooner than the 17th. is there anyway we can work her in sooner to see any doctors.  Initial call taken by: Valinda Hoar,  October 31, 2009 12:45 PM  Follow-up for Phone Call        Marchelle Gearing has opening this week- Thurs Feb 10. And sood has opening on 2-11 Can add pt there.  Will forward message to Marylene Land to call pt and move appt.  Aundra Millet Reynolds LPN  October 31, 2009 1:54 PM     Additional Follow-up for Phone Call Additional follow up Details #2::    Appt sched with MR for this afternoon at 3:50.  Pt aware that she needs to come in 10-15 min prior to appt to fill out forms. Follow-up by: Vernie Murders,  November 03, 2009 11:01 AM

## 2010-10-25 NOTE — Miscellaneous (Signed)
Summary: hyomax denied  recieved refill request for hyomax from [facility]. patient knows she is due for a office visit with Dr. Arlyce Dice and she needs an office visit before she can have any refills. I faxed the form back to the pharm. to notify them.  Appended Document: hyomax denied i have tried to contact the pt and her number is disconnected, i have also recieved a refill request for novolog which i have denied pt needs an office visit. before any meds can be refilled. I can not find in her chart where Dr. Arlyce Dice has filled any Novolog rx.

## 2010-10-25 NOTE — Procedures (Signed)
Summary: Order for Respiratory Eval/Oxygen Qualifying Services  Order for Respiratory Eval/Oxygen Qualifying Services   Imported By: Sherian Rein 01/01/2010 15:02:03  _____________________________________________________________________  External Attachment:    Type:   Image     Comment:   External Document

## 2010-10-25 NOTE — Progress Notes (Signed)
Summary: renewal  Phone Note From Pharmacy Call back at [phone]   Caller: Roe Coombs from [facility] Call For: [name]  Reason for Call: Needs renewal Summary of Call: Patient needs an new rx for Diflucan, Hyomax-SR 0.375 Initial call taken by: Tawni Levy,  December 21, 2009 1:21 PM  Follow-up for Phone Call        Dr [name], Can Archie Patten have a rx for Diflucan? I will go ahead and send her Hyomax. How do you want to prescribe if yes to Diflucan Follow-up by: Merri Ray CMA Duncan Dull),  December 21, 2009 1:44 PM  Additional Follow-up for Phone Call Additional follow up Details #1::        ok for hyomax. I don't see where diflucan was prescribed.  She'll have to contact the prescriber for that Additional Follow-up by: Louis Meckel MD,  December 22, 2009 9:29 AM    Additional Follow-up for Phone Call Additional follow up Details #2::    Will inform pt that hyomax was sent but not diflucan Follow-up by: Merri Ray CMA Duncan Dull),  December 22, 2009 10:46 AM  Prescriptions: HYOMAX-SR 0.375 MG XR12H-TAB (HYOSCYAMINE SULFATE) takes one tab twice a day as needed for abdominal pain  #25 x 2   Entered by:   Merri Ray CMA (AAMA)   Authorized by:   Louis Meckel MD   Signed by:   Merri Ray CMA (AAMA) on 12/22/2009   Method used:   Faxed to ...       [facility] (retail)       [address] [place] [address]       [place], [place]  [address]       Ph: [phone]       Fax: [phone]   RxID:   [id number]

## 2010-10-25 NOTE — Progress Notes (Signed)
Summary: [facility]   Imported By: Sherian Rein 04/04/2010 10:35:28  _____________________________________________________________________  External Attachment:    Type:   Image     Comment:   External Document

## 2010-10-25 NOTE — Progress Notes (Signed)
  Phone Note Outgoing Call   Summary of Call: Pt.contacted and informed that Dr.Kaplan has a cx. for 12/25/09 and pt. wishes to wait and see him Monday rather than see NP  today.Appt. changed. Initial call taken by: Teryl Lucy RN,  December 22, 2009 10:56 AM

## 2010-10-25 NOTE — Progress Notes (Signed)
Summary: speak to nurse/ surgery tomorrow  Phone Note Call from Patient   Caller: Patient Call For: [name] Summary of Call: pt was instructed to call today to let MR's nurse know how she is doing before her surgery tomorrow. call [phone] Initial call taken by: Tivis Ringer, CNA,  December 19, 2009 11:21 AM  Follow-up for Phone Call        pt states she is feeling better after finishing abx. She still has occ cough, but phlegm is not as yellow as before.Pt is scheduled to have renal surgery on 12/20/09. MR is aware. per last ov note he spoke to Dr. Rito Ehrlich about the pt and surgery.   I advised if there were any more questions that Dr. Rito Ehrlich needed addressed before surgery tomorrow then he can call the office and we will get in contact with MR. pt stated understanding. Carron Curie CMA  December 19, 2009 11:47 AM    Additional Follow-up for Phone Call Additional follow up Details #1::        agree. thanks Additional Follow-up by: Kalman Shan MD,  December 19, 2009 2:15 PM

## 2010-10-25 NOTE — Progress Notes (Signed)
Summary: [facility]  Phone Note Call from Patient Call back at [phone]   Caller: Patient Call For: Avya Flavell Summary of Call: Pt called stating that she just got out of the hospital 3 weeks ago and needs to follow up with MR. Pt doesnt want to wait until 12-12 to see MR. Wanted to know if she can be worked in sooner with MR otherwise is it okay to see TP. Please advise.  Initial call taken by: Reynaldo Minium CMA,  August 22, 2010 3:34 PM  Follow-up for Phone Call        spoke with pt and she states the only day she can come for HFU is on Friday 08-24-10.  Pt set to see Tp on friday at 10:30am. Carron Curie CMA  August 22, 2010 4:06 PM

## 2010-10-25 NOTE — Assessment & Plan Note (Signed)
Summary: F/U FROM LABS              [facility]   History of Present Illness Visit Type: Follow-up Visit Primary GI MD: Melvia Heaps MD [facility] Primary Provider: Lia Hopping, MD Requesting Provider: n/a Chief Complaint: Results from labs History of Present Illness:   [name] has returned for followup of her abdominal pain and diarrhea.  Symptoms actually have improved.  Diarrhea is intermittent.  Abdominal pain subsides with hyoscyamine.  IBD markers were negative.  CRP was also within normal limits.   She continues on metoclopramide for moderate gastroparesis.   GI Review of Systems    Reports abdominal pain.     Location of  Abdominal pain: lower abdomen.    Denies acid reflux, belching, bloating, chest pain, dysphagia with liquids, dysphagia with solids, heartburn, loss of appetite, nausea, vomiting, vomiting blood, weight loss, and  weight gain.      Reports diarrhea.     Denies anal fissure, black tarry stools, change in bowel habit, constipation, diverticulosis, fecal incontinence, heme positive stool, hemorrhoids, irritable bowel syndrome, jaundice, light color stool, liver problems, rectal bleeding, and  rectal pain.    Current Medications (verified): 1)  Tizanidine Hcl 4 Mg Tabs (Tizanidine Hcl) .... 3 At Bedtime 2)  Zyrtec Allergy 10 Mg Tabs (Cetirizine Hcl) .Marland Kitchen.. 1 At Bedtime 3)  Zocor 20 Mg Tabs (Simvastatin) .Marland Kitchen.. 1 At Bedtime 4)  Glucovance 5-500 Mg Tabs (Glyburide-Metformin) .Marland Kitchen.. 1 Tablet By Mouth 3 Times A Day 5)  Lasix 20 Mg Tabs (Furosemide) .... Once Daily 6)  Levemir 100 Unit/ml Soln (Insulin Detemir) .... 40 Units [place] Two Times A Day 7)  Advair Diskus 100-50 Mcg/dose Aepb (Fluticasone-Salmeterol) .... Uad 8)  Nitro-Dur 0.4 Mg/hr Pt24 (Nitroglycerin) .... As Needed 9)  Novolog 100 Unit/ml Soln (Insulin Aspart) .Marland Kitchen.. 15 Units Before Each Meal 10)  Aspirin Ec 325 Mg Tbec (Aspirin) .... Once Daily 11)  Nabumetone 750 Mg Tabs (Nabumetone) .... Take 1 Tablet By Mouth Two  Times A Day 12)  Metoclopramide Hcl 10 Mg Tabs (Metoclopramide Hcl) .... Take One By Mouth 30 Minutes Before Breakfast, Lunch, Dinner and At Bedtime. 13)  Niaspan 1000 Mg Cr-Tabs (Niacin (Antihyperlipidemic)) .... Take 2 Tabs By Month Once Daily 14)  Pindolol 10 Mg Tabs (Pindolol) .... Take 1 Tab Two Times A Day 15)  Zegerid 40-1100 Mg Caps (Omeprazole-Sodium Bicarbonate) .... Take One Tab Before Breakfast and Before Retiring 16)  Promethazine Hcl 25 Mg Tabs (Promethazine Hcl) .... Take One By Mouth Every 6 Hours As Needed For Nausea/vomiting. 17)  Lialda 1.2 Gm Tbec (Mesalamine) .... Take 2 Tabs Daily 18)  Ziac 5-6.25 Mg Tabs (Bisoprolol-Hydrochlorothiazide) .... One Tablet By Mouth Once Daily 19)  Tylenol With Codeine #3 300-30 Mg Tabs (Acetaminophen-Codeine) .... Take By Mouth As Needed Pain 20)  Hyomax-Sr 0.375 Mg Xr12h-Tab (Hyoscyamine Sulfate) .... Takes One Tab Twice A Day As Needed For Abdominal Pain 21)  Neurontin 300 Mg Caps (Gabapentin) .... Three Times A Day  Allergies (verified): 1)  ! Ampicillin 2)  Flexeril  Past History:  Past Medical History: Reviewed history from 10/05/2008 and no changes required. Asthma Diabetes GERD Hypertension Hypothyroidism Kidney Stones Sleep Apnea  Past Surgical History: Reviewed history from 02/13/2009 and no changes required. C-Section x 2 Cholecystectomy Knee Arthroscopy Pacemaker cardiac cath 1999  Family History: Reviewed history from 10/05/2008 and no changes required. No FH of Colon Cancer: Family History of Diabetes: Mother, Brother, Sister Family History of Heart Disease: Father, deceased  MI, Mother, 2 Brother, Sister, Interior and spatial designer  Social History: Reviewed history from 02/13/2009 and no changes required. Married, 2 boys Housewife Patient currently smokes.  Alcohol Use - no Daily Caffeine Use Illicit Drug Use - no Regular Exercise - no  Review of Systems       The patient complains of arthritis/joint pain, change in  vision, cough, headaches-new, heart rhythm changes, muscle pains/cramps, skin rash, sleeping problems, thirst - excessive, and vision changes.    Vital Signs:  Patient profile:   45 year old female Height:      67 inches Weight:      255.38 pounds BMI:     40.14 Pulse rate:   72 / minute Pulse rhythm:   regular BP sitting:   110 / 76  (left arm) Cuff size:   regular  Vitals Entered By: June McMurray CMA Duncan Dull) (October 23, 2009 9:14 AM)   Impression & Recommendations:  Problem # 1:  IBS (ICD-564.1) GI symptoms are more likely due to IBS.  Are currently well-controlled.  Recommendations #1 continue hyomax #2 Imodium with the onset of diarrhea #3 discontinue lialda  Problem # 2:  GASTROPARESIS (ICD-536.3) Plan to discontinue metoclopramide.  If she becomes symptomatic again I will try erythromycin  Problem # 3:  DIARRHEA (OZD-664.40)  Patient Instructions: 1)  cc Dr. Lia Hopping

## 2010-10-25 NOTE — Procedures (Signed)
Summary: Oximetry/[facility]  Oximetry/[facility]   Imported By: Sherian Rein 11/21/2009 15:12:10  _____________________________________________________________________  External Attachment:    Type:   Image     Comment:   External Document

## 2010-10-25 NOTE — Progress Notes (Signed)
Summary: feet, legs, ankle are swollen  Phone Note Call from Patient Call back at Home Phone [phone] Call back at [phone]   Caller: Patient Reason for Call: Talk to Nurse Details for Reason: Per pt calling, leg, feet, ankle are swollen, unable to get appt with dr. Diona Browner 2/28. pcp told her to call an try to get appt with gt or talk with his nurse.  Initial call taken by: Lorne Skeens,  November 14, 2009 3:03 PM  Follow-up for Phone Call        Discussed with Dr [name] increase fluid pill  for two days per Dr [name]    New/Updated Medications: LASIX 20 MG TABS (FUROSEMIDE) once daily and as needed as needed swelling Prescriptions: LASIX 20 MG TABS (FUROSEMIDE) once daily and as needed as needed swelling  #45 x 6   Entered by:   Dennis Bast, RN, BSN   Authorized by:   Laren Boom, MD, [facility]   Signed by:   Dennis Bast, RN, BSN on 11/14/2009   Method used:   Faxed to ...       [facility] (retail)       [address]       [place], [place]  [address]       Ph: [phone]       Fax: [phone]   RxID:   [id number] LASIX 20 MG TABS (FUROSEMIDE) once daily and as needed as needed swelling  #45 x 6   Entered by:   Dennis Bast, RN, BSN   Authorized by:   Laren Boom, MD, [facility]   Signed by:   Dennis Bast, RN, BSN on 11/14/2009   Method used:   Print then Give to Patient   RxID:   [id number]

## 2010-10-25 NOTE — Assessment & Plan Note (Signed)
Summary: F/U APPT...LSW.   History of Present Illness Visit Type: Follow-up Visit Primary GI MD: Melvia Heaps MD [facility] Primary Provider: Lia Hopping, MD Requesting Provider: n/a Chief Complaint: follow-up appt.  History of Present Illness:   [name]  for followup of herabdominal pain and diarrhea.  She is currently off prednisone and is feeling better.  She has occasional lower abdominal pain.  She claims that she felt the effects of the prednisone after several days which improved her symptoms.  She still has frequent diarrhea.   GI Review of Systems    Reports abdominal pain.     Location of  Abdominal pain: lower abdomen.    Denies acid reflux, belching, bloating, chest pain, dysphagia with liquids, dysphagia with solids, heartburn, loss of appetite, nausea, vomiting, vomiting blood, weight loss, and  weight gain.        Denies anal fissure, black tarry stools, change in bowel habit, constipation, diarrhea, diverticulosis, fecal incontinence, heme positive stool, hemorrhoids, irritable bowel syndrome, jaundice, light color stool, liver problems, rectal bleeding, and  rectal pain.    Current Medications (verified): 1)  Tizanidine Hcl 4 Mg Tabs (Tizanidine Hcl) .... 3 At Bedtime 2)  Zyrtec Allergy 10 Mg Tabs (Cetirizine Hcl) .Marland Kitchen.. 1 At Bedtime 3)  Zocor 20 Mg Tabs (Simvastatin) .Marland Kitchen.. 1 At Bedtime 4)  Glucovance 5-500 Mg Tabs (Glyburide-Metformin) .Marland Kitchen.. 1 Tablet By Mouth 3 Times A Day 5)  Lasix 20 Mg Tabs (Furosemide) .... Once Daily 6)  Levemir 100 Unit/ml Soln (Insulin Detemir) .... 40 Units [place] Two Times A Day 7)  Advair Diskus 100-50 Mcg/dose Aepb (Fluticasone-Salmeterol) .... Uad 8)  Nitro-Dur 0.4 Mg/hr Pt24 (Nitroglycerin) .... As Needed 9)  Novolog 100 Unit/ml Soln (Insulin Aspart) .Marland Kitchen.. 15 Units Before Each Meal 10)  Aspirin Ec 325 Mg Tbec (Aspirin) .... Once Daily 11)  Nabumetone 750 Mg Tabs (Nabumetone) .... Take 1 Tablet By Mouth Two Times A Day 12)  Metoclopramide Hcl 10  Mg Tabs (Metoclopramide Hcl) .... Take One By Mouth 30 Minutes Before Breakfast, Lunch, Dinner and At Bedtime. 13)  Niaspan 1000 Mg Cr-Tabs (Niacin (Antihyperlipidemic)) .... Take 2 Tabs By Month Once Daily 14)  Pindolol 10 Mg Tabs (Pindolol) .... Take 1 Tab Two Times A Day 15)  Zegerid 40-1100 Mg Caps (Omeprazole-Sodium Bicarbonate) .... Take One Tab Before Breakfast and Before Retiring 16)  Promethazine Hcl 25 Mg Tabs (Promethazine Hcl) .... Take One By Mouth Every 6 Hours As Needed For Nausea/vomiting. 17)  Lialda 1.2 Gm Tbec (Mesalamine) .... Take 2 Tabs Daily 18)  Ziac 5-6.25 Mg Tabs (Bisoprolol-Hydrochlorothiazide) .... One Tablet By Mouth Once Daily 19)  Tylenol With Codeine #3 300-30 Mg Tabs (Acetaminophen-Codeine) .... Take By Mouth As Needed Pain  Allergies (verified): 1)  ! Ampicillin 2)  Flexeril  Past History:  Past Medical History: Reviewed history from 10/05/2008 and no changes required. Asthma Diabetes GERD Hypertension Hypothyroidism Kidney Stones Sleep Apnea  Past Surgical History: Reviewed history from 02/13/2009 and no changes required. C-Section x 2 Cholecystectomy Knee Arthroscopy Pacemaker cardiac cath 1999  Family History: Reviewed history from 10/05/2008 and no changes required. No FH of Colon Cancer: Family History of Diabetes: Mother, Brother, Sister Family History of Heart Disease: Father, deceased MI, Mother, 2 Brother, Sister, Interior and spatial designer  Social History: Reviewed history from 02/13/2009 and no changes required. Married, 2 boys Housewife Patient currently smokes.  Alcohol Use - no Daily Caffeine Use Illicit Drug Use - no Regular Exercise - no  Review of Systems  The  patient denies allergy/sinus, anemia, anxiety-new, arthritis/joint pain, back pain, blood in urine, breast changes/lumps, change in vision, confusion, cough, coughing up blood, depression-new, fainting, fatigue, fever, headaches-new, hearing problems, heart murmur, heart rhythm  changes, itching, muscle pains/cramps, night sweats, nosebleeds, shortness of breath, skin rash, sleeping problems, sore throat, swelling of feet/legs, swollen lymph glands, thirst - excessive, urination - excessive, urination changes/pain, urine leakage, vision changes, and voice change.    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  While she clearly had erosions of the cecum with biopsies suggestive of inflammatory bowel disease, symptoms have not clearly responded to steroids.  Recommendations #1 check IBD serologies and CRP #2 continue lialda #3 hyomax p.r.n.  Problem # 2:  GASTROPARESIS (ICD-536.3)  Other Orders: TLB-CRP-High Sensitivity (C-Reactive Protein) (86140-FCRP) IBD Serology ([facility] #1007) (04540)  Patient Instructions: 1)  cc Dr. Olena Leatherwood

## 2010-10-25 NOTE — Assessment & Plan Note (Signed)
Summary: NP follow up - med calendar   Copy to:  n/a Primary Provider/Referring Provider:  Lia Hopping, MD  CC:  new med calendar - pt brought all meds with her today.  pt c/o dyspnea, wheezing, and cough occ prod greenish/yellow mucus that has improved slightly since last OV.Marland Kitchen  History of Present Illness: IOV 11/03/2009. New eval. 44 year old female. Active  50 pack smoker. Main complaint is "cannot breathe". Reportedly fine till 4 weeks ago and then developed dyspnea. A week later developed wheeze.  Insidious onset. Slowly progressive. Then, one week ago saw PMD. Was hospitalized. Reportedly needed o2. Discharged 2d later. Was informed that she might have COPD but denies being told she had pneumonia or asthma. Was advised to seek a pulmonlogist help. Recollects being treated with o2, antibiotics, and steroids. At any point in time, denies associated fever, sputum, hemoptysis, chills, night, sweats, nausea., vomit, or change in chronic diarrhea. However, since discharge has dry cough but says there is sputum stuck in throat that she is unable to expectorate.  Denies being discharged on antibiotics or prednisone taper. New medicine since discharge are advair and albuterol. No outside records available.   November 13, 2009 --Presents for follow up and med review. She has brought all her meds today. She has Medicaid rx coverage. We discussed her smoking cesstation- she is not ready to quit yet. I have educated on the dangers of smoking. We made a new med calendar for her w/ med education. She is feeling some better but wears out easily. She has known OSA but refuses CPAP. Offerred a sleep consult to help evaluate mask and alternatives. She declines. Her husband is concerned that she stops breathing in night. I have advised her on risks of dangers of OSA--She replied with "the damage is done and all my family has it and they are fine. " Denies chest pain,   orthopnea, hemoptysis, fever, n/v/d,  headache.    Medications Prior to Update: 1)  Advair Diskus 250-50 Mcg/dose Aepb (Fluticasone-Salmeterol) .... One Puff Twice Daily 2)  Duoneb 0.5-2.5 (3) Mg/47ml Soln (Ipratropium-Albuterol) .... Four Times A Day 3)  Pindolol 10 Mg Tabs (Pindolol) .... Take 1 Tab Two Times A Day 4)  Aspirin Ec 325 Mg Tbec (Aspirin) .... Once Daily 5)  Lasix 20 Mg Tabs (Furosemide) .... Once Daily 6)  Zocor 20 Mg Tabs (Simvastatin) .Marland Kitchen.. 1 At Bedtime 7)  Niaspan 1000 Mg Cr-Tabs (Niacin (Antihyperlipidemic)) .... Take 2 Tabs By Month Once Daily 8)  Glucovance 5-500 Mg Tabs (Glyburide-Metformin) .Marland Kitchen.. 1 Tablet By Mouth 3 Times A Day 9)  Zegerid 40-1100 Mg Caps (Omeprazole-Sodium Bicarbonate) .... Take One Tab Before Breakfast and Before Retiring 10)  Metoclopramide Hcl 10 Mg Tabs (Metoclopramide Hcl) .... Take One By Mouth 30 Minutes Before Breakfast, Lunch, Dinner and At Bedtime. 11)  Neurontin 300 Mg Caps (Gabapentin) .... Three Times A Day 12)  Zyrtec Allergy 10 Mg Tabs (Cetirizine Hcl) .Marland Kitchen.. 1 At Bedtime 13)  Tizanidine Hcl 4 Mg Tabs (Tizanidine Hcl) .... 3 At Bedtime 14)  Levemir 100 Unit/ml Soln (Insulin Detemir) .... 40 Units [place] Two Times A Day 15)  Novolog 100 Unit/ml Soln (Insulin Aspart) .Marland Kitchen.. 15 Units Before Each Meal 16)  Nitro-Dur 0.4 Mg/hr Pt24 (Nitroglycerin) .... As Needed 17)  Hyomax-Sr 0.375 Mg Xr12h-Tab (Hyoscyamine Sulfate) .... Takes One Tab Twice A Day As Needed For Abdominal Pain 18)  Promethazine Hcl 25 Mg Tabs (Promethazine Hcl) .... Take One By Mouth Every 6 Hours As  Needed For Nausea/vomiting. 19)  Tylenol With Codeine #3 300-30 Mg Tabs (Acetaminophen-Codeine) .... Take By Mouth As Needed Pain 20)  Nabumetone 750 Mg Tabs (Nabumetone) .... Take 1 Tablet By Mouth Two Times A Day 21)  Lialda 1.2 Gm Tbec (Mesalamine) .... Take 2 Tabs Daily 22)  Ziac 5-6.25 Mg Tabs (Bisoprolol-Hydrochlorothiazide) .... One Tablet By Mouth Once Daily 23)  Prednisone 10 Mg  Tabs (Prednisone) .... 6 Tabs Daily X3 Days, Then  4 Tabs Daily X3 Days, Then 3 Tab Daily X 3 Days, Then 2 Tabs Daily X3 Days, Then 1 Tab Daily X3 Days, Then Stop  Current Medications (verified): 1)  Advair Diskus 250-50 Mcg/dose Aepb (Fluticasone-Salmeterol) .... One Puff Twice Daily 2)  Ipratropium Bromide 0.06 % Soln (Ipratropium Bromide) .... Inhale 1 Vial Via Hhn Four Times A Day 3)  Pindolol 10 Mg Tabs (Pindolol) .... Take 1 Tab Two Times A Day 4)  Aspirin Ec 325 Mg Tbec (Aspirin) .... Take 1 Tab By Mouth At Bedtime 5)  Lasix 20 Mg Tabs (Furosemide) .... Once Daily 6)  Zocor 20 Mg Tabs (Simvastatin) .Marland Kitchen.. 1 At Bedtime 7)  Niaspan 1000 Mg Cr-Tabs (Niacin (Antihyperlipidemic)) .... Take 1 Tab By Mouth At Bedtime 8)  Lisinopril 20 Mg Tabs (Lisinopril) .... Take 1 Tablet By Mouth Once A Day 9)  Glucovance 5-500 Mg Tabs (Glyburide-Metformin) .Marland Kitchen.. 1 Tablet By Mouth Two Times A Day 10)  Zegerid 20-1100 Mg Caps (Omeprazole-Sodium Bicarbonate) .... Take 1 Capsule By Mouth Two Times A Day 30 Minutes Before Meal 11)  Metoclopramide Hcl 10 Mg Tabs (Metoclopramide Hcl) .... Take One By Mouth 30 Minutes Before Breakfast, Lunch, Dinner and At Bedtime. 12)  Neurontin 300 Mg Caps (Gabapentin) .... Three Times A Day 13)  Zyrtec Allergy 10 Mg Tabs (Cetirizine Hcl) .Marland Kitchen.. 1 Tab By Mouth At Bedtime 14)  Tizanidine Hcl 4 Mg Tabs (Tizanidine Hcl) .... 3 Tabs By Mouth At Bedtime 15)  Levemir 100 Unit/ml Soln (Insulin Detemir) .... 40 Units With Breakfast and At Bedtime 16)  Novolog 100 Unit/ml Soln (Insulin Aspart) .Marland Kitchen.. 15 Units Before Each Meal 17)  Nitro-Dur 0.4 Mg/hr Pt24 (Nitroglycerin) .... As Needed 18)  Albuterol Sulfate (2.5 Mg/63ml) 0.083% Nebu (Albuterol Sulfate) .Marland Kitchen.. 1 Neb Via Hhn Every 4 Hours As Needed 19)  Ventolin Hfa 108 (90 Base) Mcg/act Aers (Albuterol Sulfate) .... 2 Puffs Every 4 Hours As Needed 20)  Methocarbamol 750 Mg Tabs (Methocarbamol) .Marland Kitchen.. 1 Every 8 Hours As Needed 21)  Hyomax-Sr 0.375 Mg Xr12h-Tab (Hyoscyamine Sulfate) .... Takes One Tab  Twice A Day As Needed For Abdominal Pain 22)  Promethazine Hcl 25 Mg Tabs (Promethazine Hcl) .... Take One By Mouth Every 4 Hours As Needed For Nausea/vomiting. 23)  Nitrostat 0.4 Mg Subl (Nitroglycerin) .Marland Kitchen.. 1 Under Tongue Every 5 Minutes X3 As Needed Chest Pain 24)  Tylenol With Codeine #3 300-30 Mg Tabs (Acetaminophen-Codeine) .... Take 1 By Mouth Every 6 Hours As Needed Pain 25)  Lomotil 2.5-0.025 Mg Tabs (Diphenoxylate-Atropine) .Marland Kitchen.. 1 Tab By Mouth Every 8 Hours As Needed  Allergies (verified): 1)  ! Ampicillin 2)  Flexeril  Past History:  Past Surgical History: Last updated: 02/13/2009 C-Section x 2 Cholecystectomy Knee Arthroscopy Pacemaker cardiac cath 1999  Family History: Last updated: 10/05/2008 No FH of Colon Cancer: Family History of Diabetes: Mother, Brother, Sister Family History of Heart Disease: Father, deceased MI, Mother, 2 Brother, Sister, Interior and spatial designer  Social History: Last updated: 11/13/2009 Married, 2 boys Housewife Patient currently smokes, since 1974 x 1/2-1  ppd Alcohol Use - no Daily Caffeine Use Illicit Drug Use - no Regular Exercise - no  Risk Factors: Caffeine Use: 2 (10/05/2008) Exercise: no (02/13/2009)  Risk Factors: Smoking Status: current (10/05/2008) Packs/Day: 1 (10/05/2008) Cans of tobacco/wk: no (10/05/2008)  Past Medical History: Asthma--COPD  Diabetes GERD Hypertension Hypothyroidism Kidney Stones Sleep Apnea  Social History: Married, 2 boys Housewife Patient currently smokes, 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  pt c/o dyspnea, wheezing, cough occ prod greenish/yellow mucus that has improved slightly since last OV. Is Patient Diabetic? Yes Comments Medications reviewed with patient Daytime contact number verified with patient. Boone Master CNA  November 13, 2009 10:18 AM    Physical Exam  Additional Exam:  GEN: A/Ox3; morbidly obese female, smells of smoke HEENT:  [place]/AT, , EACs-clear, TMs-wnl, NOSE-clear, THROAT-clear NECK:  Supple w/ fair ROM; no JVD; normal carotid impulses w/o bruits; no thyromegaly or nodules palpated; no lymphadenopathy. RESP  Clear to P & A; w/o, wheezes/ rales/ or rhonchi. CARD:  RRR, no m/r/g   GI:   Soft & nt; nml bowel sounds; no organomegaly or masses detected. [place]: Warm bil,  no calf tenderness 1+edema w/ venous insufficiency Neuro: intact w/ no focal deficits noted.    Impression & Recommendations:  Problem # 1:  CHRONIC OBSTRUCTIVE PULMONARY DISEASE, ACUTE EXACERBATION (ICD-491.21)  Recent flare, now resolved.  rec cont on same regimen.  Meds reviewed with pt education and computerized med calendar completed/adjusted.     Orders: DME Referral (DME) Est. Patient Level IV (91478) Tobacco use cessation intermediate 3-10 minutes (29562)  Problem # 2:  OBSTRUCTIVE SLEEP APNEA (ICD-327.23)  Underlying OSA, decline CPAP pt to consider sleep evaluation will check ONO to evaluate for O2 need.   Orders: Est. Patient Level IV (13086)  Medications Added to Medication List This Visit: 1)  Ipratropium Bromide 0.06 % Soln (Ipratropium bromide) .... Inhale 1 vial via hhn four times a day 2)  Aspirin Ec 325 Mg Tbec (Aspirin) .... Take 1 tab by mouth at bedtime 3)  Niaspan 1000 Mg Cr-tabs (Niacin (antihyperlipidemic)) .... Take 1 tab by mouth at bedtime 4)  Lisinopril 20 Mg Tabs (Lisinopril) .... Take 1 tablet by mouth once a day 5)  Glucovance 5-500  Mg Tabs (Glyburide-metformin) .Marland Kitchen.. 1 tablet by mouth two times a day 6)  Zegerid 20-1100 Mg Caps (Omeprazole-sodium bicarbonate) .... Take 1 capsule by mouth two times a day 30 minutes before meal 7)  Metoclopramide Hcl 10 Mg Tabs (Metoclopramide hcl) .... Take one by mouth 30 minutes before breakfast, lunch, dinner and at bedtime. 8)  Zyrtec Allergy 10 Mg Tabs (Cetirizine hcl) .Marland Kitchen.. 1 tab by mouth at bedtime 9)  Tizanidine Hcl 4 Mg Tabs (Tizanidine hcl) .Marland KitchenMarland KitchenMarland Kitchen  3 tabs by mouth at bedtime 10)  Levemir 100 Unit/ml Soln (Insulin detemir) .... 40 units with breakfast and at bedtime 11)  Novolog 100 Unit/ml Soln (Insulin aspart) .Marland Kitchen.. 15 units before each meal 12)  Albuterol Sulfate (2.5 Mg/53ml) 0.083% Nebu (Albuterol sulfate) .Marland Kitchen.. 1 neb via hhn every 4 hours as needed 13)  Ventolin Hfa 108 (90 Base) Mcg/act Aers (Albuterol sulfate) .... 2 puffs every 4 hours as needed 14)  Methocarbamol 750 Mg Tabs (Methocarbamol) .Marland Kitchen.. 1 every 8 hours as needed 15)  Promethazine Hcl 25 Mg Tabs (Promethazine hcl) .... Take one by mouth every 4 hours as needed for nausea/vomiting. 16)  Nitrostat 0.4 Mg Subl (Nitroglycerin) .Marland Kitchen.. 1 under tongue every 5 minutes x3 as needed chest pain 17)  Tylenol With Codeine #3 300-30 Mg Tabs (Acetaminophen-codeine) .... Take 1 by mouth every 6 hours as needed pain 18)  Lomotil 2.5-0.025 Mg Tabs (Diphenoxylate-atropine) .Marland Kitchen.. 1 tab by mouth every 8 hours as needed  Complete Medication List: 1)  Advair Diskus 250-50 Mcg/dose Aepb (Fluticasone-salmeterol) .... One puff twice daily 2)  Ipratropium Bromide 0.06 % Soln (Ipratropium bromide) .... Inhale 1 vial via hhn four times a day 3)  Pindolol 10 Mg Tabs (Pindolol) .... Take 1 tab two times a day 4)  Aspirin Ec 325 Mg Tbec (Aspirin) .... Take 1 tab by mouth at bedtime 5)  Lasix 20 Mg Tabs (Furosemide) .... Once daily 6)  Zocor 20 Mg Tabs (Simvastatin) .Marland Kitchen.. 1 at bedtime 7)  Niaspan 1000 Mg Cr-tabs (Niacin (antihyperlipidemic)) .... Take 1 tab  by mouth at bedtime 8)  Lisinopril 20 Mg Tabs (Lisinopril) .... Take 1 tablet by mouth once a day 9)  Glucovance 5-500 Mg Tabs (Glyburide-metformin) .Marland Kitchen.. 1 tablet by mouth two times a day 10)  Zegerid 20-1100 Mg Caps (Omeprazole-sodium bicarbonate) .... Take 1 capsule by mouth two times a day 30 minutes before meal 11)  Metoclopramide Hcl 10 Mg Tabs (Metoclopramide hcl) .... Take one by mouth 30 minutes before breakfast, lunch, dinner and at bedtime. 12)  Neurontin 300 Mg Caps (Gabapentin) .... Three times a day 13)  Zyrtec Allergy 10 Mg Tabs (Cetirizine hcl) .Marland Kitchen.. 1 tab by mouth at bedtime 14)  Tizanidine Hcl 4 Mg Tabs (Tizanidine hcl) .... 3 tabs by mouth at bedtime 15)  Levemir 100 Unit/ml Soln (Insulin detemir) .... 40 units with breakfast and at bedtime 16)  Novolog 100 Unit/ml Soln (Insulin aspart) .Marland Kitchen.. 15 units before each meal 17)  Nitro-dur 0.4 Mg/hr Pt24 (Nitroglycerin) .... As needed 18)  Albuterol Sulfate (2.5 Mg/90ml) 0.083% Nebu (Albuterol sulfate) .Marland Kitchen.. 1 neb via hhn every 4 hours as needed 19)  Ventolin Hfa 108 (90 Base) Mcg/act Aers (Albuterol sulfate) .... 2 puffs every 4 hours as needed 20)  Methocarbamol 750 Mg Tabs (Methocarbamol) .Marland Kitchen.. 1 every 8 hours as needed 21)  Hyomax-sr 0.375 Mg Xr12h-tab (Hyoscyamine sulfate) .... Takes one tab twice a day as needed for abdominal pain 22)  Promethazine Hcl 25 Mg Tabs (Promethazine hcl) .... Take one by mouth every 4 hours as needed for nausea/vomiting. 23)  Nitrostat 0.4 Mg Subl (Nitroglycerin) .Marland Kitchen.. 1 under tongue every 5 minutes x3 as needed chest pain 24)  Tylenol With Codeine #3 300-30 Mg Tabs (Acetaminophen-codeine) .... Take 1 by mouth every 6 hours as needed pain 25)  Lomotil 2.5-0.025 Mg Tabs (Diphenoxylate-atropine) .Marland Kitchen.. 1 tab by mouth every 8 hours as needed  Patient Instructions: 1)  Follow  med calendar, and bring to each visit.  2)  We are setting you for an overnight oximetry.  3)  follow up Dr. Marchelle Gearing in 2-3 weeks.   4)  MOST IMPORTANT IS TO QUIT SMOKING.  5)  Please contact office for sooner follow up if symptoms do not improve or worsen    Immunization History:  Influenza Immunization History:    Influenza:  historical (06/23/2009)  Pneumovax Immunization History:    Pneumovax:  historical (11/13/2009)   Appended Document: NP follow up - med calendar Thanks a lot

## 2010-10-25 NOTE — Progress Notes (Signed)
  Phone Note Refill Request Call back at [facility] Phone [phone] Message from:  Fax from Pharmacy on October 10, 2010 3:20 PM  Refills Requested: Medication #1:  METOCLOPRAMIDE HCL 10 MG TABS Take one by mouth 30 minutes before breakfast   Dosage confirmed as above?Dosage Confirmed   Brand Name Necessary? No   Supply Requested: 6 months  Method Requested: Electronic Initial call taken by: Merri Ray CMA (AAMA),  October 10, 2010 3:20 PM    New/Updated Medications: METOCLOPRAMIDE HCL 10 MG TABS (METOCLOPRAMIDE HCL) Take one by mouth 30 minutes before breakfast, lunch, dinner and at bedtime. Prescriptions: METOCLOPRAMIDE HCL 10 MG TABS (METOCLOPRAMIDE HCL) Take one by mouth 30 minutes before breakfast, lunch, dinner and at bedtime.  #120 x 6   Entered by:   Merri Ray CMA (AAMA)   Authorized by:   Louis Meckel MD   Signed by:   Merri Ray CMA (AAMA) on 10/10/2010   Method used:   Print then Give to Patient   RxID:   [id number]

## 2010-10-25 NOTE — Progress Notes (Signed)
Summary: Letter sent for pt to pick up records and make appt/kcw  Phone Note Outgoing Call   Call placed by: Reynaldo Minium CMA,  March 30, 2010 4:01 PM Call placed to: Patient Summary of Call: Attempted to call pt to let her know that she is due for follow up visit with MR and her records of sleep study is here for pick up at the front desk. Number in chart has been disconnected and no other numbers in EMR. Letter has been mailed to pt to call our office and schedule appt with MR and pick up records. Initial call taken by: Reynaldo Minium CMA,  March 30, 2010 4:02 PM

## 2010-10-25 NOTE — Letter (Signed)
Summary: [facility]  [facility]ESS [facility]   Imported By: Faythe Ghee 12/18/2009 11:41:41  _____________________________________________________________________  External Attachment:    Type:   Image     Comment:   External Document

## 2010-10-25 NOTE — Progress Notes (Signed)
Summary: rx not in pharmacy  Phone Note Call from Patient Call back at Home Phone [phone]   Caller: Patient Call For: Arlyce Dice Reason for Call: Talk to Nurse Summary of Call: New rx given to her for cramps yesterdya was never sent to the pharmacy, please send it to [facility]. (hyomax) Initial call taken by: Tawni Levy,  September 28, 2009 8:03 AM  Follow-up for Phone Call        Called pt to inform that I refaxed rx this morning Follow-up by: Merri Ray CMA (AAMA),  September 28, 2009 8:16 AM    Prescriptions: HYOMAX-SR 0.375 MG XR12H-TAB (HYOSCYAMINE SULFATE) takes one tab twice a day as needed for abdominal pain  #25 x 2   Entered by:   Merri Ray CMA (AAMA)   Authorized by:   Louis Meckel MD   Signed by:   Merri Ray CMA (AAMA) on 09/28/2009   Method used:   Faxed to ...       [facility]       [address]       [place], [place]  [address]       Ph: [phone] or [phone]       Fax: [phone]   RxID:   [id number]

## 2010-10-25 NOTE — Progress Notes (Signed)
Summary: nos appt  Phone Note Call from Patient   Caller: [email or web address]  Call For: parrett Summary of Call: LMTCB x2 to [facility]sc nos from 12/2. Initial call taken by: Darletta Moll,  August 27, 2010 4:12 PM

## 2010-10-25 NOTE — Letter (Signed)
Summary: Office Visit Letter  [facility]  [address] [place], [place] [address]   Phone: [phone]  Fax: [phone]      December 20, 2009 MRN: [id number]   [name] [address] [place], [place]  [address]   Dear [name],   According to our records, it is time for you to schedule a follow-up office visit with [place] in the month of May 2011.   At your convenience, please call [phone] (option #2)to schedule an office visit. If you have any questions, concerns, or feel that this letter is in error, we would appreciate your call.   Sincerely,  Barbette Hair. Arlyce Dice, M.D.   [facility] [phone]

## 2010-10-25 NOTE — Assessment & Plan Note (Signed)
Summary: 6 MTH F/U PER CHECKOUT ON 05/10/09/TG   Visit Type:  Follow-up Primary Provider:  Lia Hopping, MD   History of Present Illness: 45 year old woman presents for a followup visit. She saw Dr. [name] back in December with findings of normal device function. Over-the-counter medications were recommended for described pain at her device pocket site.  I saw Ms. Verma back in August of last year at which time we referred her for followup ischemic testing. Lexiscan Myoview demonstrated soft tissue attenuation without frank ischemia, LVEF 59%. She has never had any objective evidence of obstructive coronary artery disease, either at cardiac catheterization, or on subsequent noninvasive followup. I discussed this with her today.  Ms. Mcever states that she was recently hospitalized at [facility], with COPD. She is followed by our pulmonary division. She also has obstructive sleep apnea, presently without a CPAP device. She is bothered by lower extremity edema, below the knees, which may well be a reflection of COPD and obstructive sleep apnea effects on her right heart. Left ventricular systolic function is consistently been normal. She is using Lasix.  Clinical Review Panels:  Lipid Levels LDL 74 (03/01/2009) HDL 50 (03/01/2009) Cholesterol 195 (03/01/2009)    Current Medications (verified): 1)  Advair Diskus 250-50 Mcg/dose Aepb (Fluticasone-Salmeterol) .... One Puff Twice Daily 2)  Ipratropium Bromide 0.06 % Soln (Ipratropium Bromide) .... Inhale 1 Vial Via Hhn Four Times A Day 3)  Pindolol 10 Mg Tabs (Pindolol) .... Take 1 Tab Two Times A Day 4)  Aspirin Ec 325 Mg Tbec (Aspirin) .... Take 1 Tab By Mouth At Bedtime 5)  Lasix 20 Mg Tabs (Furosemide) .... Once Daily and As Needed As Needed Swelling 6)  Zocor 20 Mg Tabs (Simvastatin) .Marland Kitchen.. 1 At Bedtime 7)  Niaspan 1000 Mg Cr-Tabs (Niacin (Antihyperlipidemic)) .... Take 1 Tab By Mouth At Bedtime 8)  Lisinopril 20 Mg Tabs  (Lisinopril) .... Take 1 Tablet By Mouth Once A Day 9)  Glucovance 5-500 Mg Tabs (Glyburide-Metformin) .Marland Kitchen.. 1 Tablet By Mouth Two Times A Day 10)  Zegerid 20-1100 Mg Caps (Omeprazole-Sodium Bicarbonate) .... Take 1 Capsule By Mouth Two Times A Day 30 Minutes Before Meal 11)  Metoclopramide Hcl 10 Mg Tabs (Metoclopramide Hcl) .... Take One By Mouth 30 Minutes Before Breakfast, Lunch, Dinner and At Bedtime. 12)  Neurontin 300 Mg Caps (Gabapentin) .... Three Times A Day 13)  Zyrtec Allergy 10 Mg Tabs (Cetirizine Hcl) .Marland Kitchen.. 1 Tab By Mouth At Bedtime 14)  Tizanidine Hcl 4 Mg Tabs (Tizanidine Hcl) .... 3 Tabs By Mouth At Bedtime 15)  Levemir 100 Unit/ml Soln (Insulin Detemir) .... 40 Units With Breakfast and At Bedtime 16)  Novolog 100 Unit/ml Soln (Insulin Aspart) .Marland Kitchen.. 15 Units Before Each Meal 17)  Nitro-Dur 0.4 Mg/hr Pt24 (Nitroglycerin) .... As Needed 18)  Albuterol Sulfate (2.5 Mg/75ml) 0.083% Nebu (Albuterol Sulfate) .Marland Kitchen.. 1 Neb Via Hhn Every 4 Hours As Needed 19)  Ventolin Hfa 108 (90 Base) Mcg/act Aers (Albuterol Sulfate) .... 2 Puffs Every 4 Hours As Needed 20)  Methocarbamol 750 Mg Tabs (Methocarbamol) .Marland Kitchen.. 1 Every 8 Hours As Needed 21)  Hyomax-Sr 0.375 Mg Xr12h-Tab (Hyoscyamine Sulfate) .... Takes One Tab Twice A Day As Needed For Abdominal Pain 22)  Promethazine Hcl 25 Mg Tabs (Promethazine Hcl) .... Take One By Mouth Every 4 Hours As Needed For Nausea/vomiting. 23)  Nitrostat 0.4 Mg Subl (Nitroglycerin) .Marland Kitchen.. 1 Under Tongue Every 5 Minutes X3 As Needed Chest Pain 24)  Tylenol With Codeine #3 300-30 Mg Tabs (  Acetaminophen-Codeine) .... Take 1 By Mouth Every 6 Hours As Needed Pain 25)  Lomotil 2.5-0.025 Mg Tabs (Diphenoxylate-Atropine) .Marland Kitchen.. 1 Tab By Mouth Every 8 Hours As Needed  Allergies (verified): 1)  ! Ampicillin 2)  Flexeril  Past History:  Past Medical History: Last updated: 11/13/2009 Asthma--COPD  Diabetes GERD Hypertension Hypothyroidism Kidney Stones Sleep Apnea  Social  History: Last updated: 11/13/2009 Married, 2 boys Housewife Patient currently smokes, since 1974 x 1/2-1 ppd Alcohol Use - no Daily Caffeine Use Illicit Drug Use - no Regular Exercise - no  Review of Systems  The patient denies anorexia, fever, chest pain, syncope, prolonged cough, hemoptysis, melena, and hematochezia.         Otherwise reviewed and negative except as outlined above.  Vital Signs:  Patient profile:   45 year old female Weight:      247 pounds Pulse rate:   69 / minute BP sitting:   128 / 94  (right arm)  Vitals Entered By: Dreama Saa, CNA (November 20, 2009 10:09 AM)  Physical Exam  Additional Exam:  Obese woman in no acute distress. HEENT: Conjunctiva and lids normal, oropharynx with poor dentition. Neck: Supple, no elevated jugular venous pressure or carotid bruits. Lungs: Diminished breath sounds, no wheezing, and nonlabored. Cardiac: Regular rate and rhythm, no S3. Abdomen: Soft, nontender, bowel sounds present. Extremities: 1+ edema below the knees with venous stasis, distal pulses one plus. Skin: Warm and dry. Musculoskeletal: No gross deformities. Neuropsychiatric: Alert and oriented x3, affect appropriate.   PPM Specifications Following MD:  Lewayne Bunting, MD     PPM Vendor:  [facility] [employer]     PPM Model Number:  [id number]     PPM Serial Number:  [id number] PPM DOI:  10/16/2007     PPM Implanting MD:  Lewayne Bunting, MD  Lead 1    Location: RV     DOI: 10/16/2007     Model #: [id number]     Serial #: [id number]     Status: active   Indications:  SYMTOMATIC BRADY   PPM Follow Up Pacer Dependent:  No      Episodes Coumadin:  No  Parameters Mode:  DDDR     Lower Rate Limit:  60     Upper Rate Limit:  120 Paced AV Delay:  300     Sensed AV Delay:  260  Impression & Recommendations:  Problem # 1:  HYPERTENSION, BENIGN ESSENTIAL (ICD-401.1)  Continue present medical regimen.  Her updated medication list for this problem includes:    Pindolol 10 Mg Tabs  (Pindolol) .Marland Kitchen... Take 1 tab two times a day    Aspirin Ec 325 Mg Tbec (Aspirin) .Marland Kitchen... Take 1 tab by mouth at bedtime    Lasix 20 Mg Tabs (Furosemide) ..... Once daily and as needed as needed swelling    Lisinopril 20 Mg Tabs (Lisinopril) .Marland Kitchen... Take 1 tablet by mouth once a day  Problem # 2:  MIXED HYPERLIPIDEMIA (ICD-272.2)  Lipid numbers have been fairly well controlled based on last assessment.  Her updated medication list for this problem includes:    Zocor 20 Mg Tabs (Simvastatin) .Marland Kitchen... 1 at bedtime    Niaspan 1000 Mg Cr-tabs (Niacin (antihyperlipidemic)) .Marland Kitchen... Take 1 tab by mouth at bedtime  Problem # 3:  CARDIAC PACEMAKER IN SITU (ICD-V45.01)  Followed by Dr. 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 She continues on atenolol for control of  intermittent palpitations. She has had no recent syncope.  Problem # 4:  OBSTRUCTIVE SLEEP APNEA (ICD-327.23)  Followed by our pulmonary division. I asked Ms. Casco to continue to work on getting a CPAP machine for regular use.  Problem # 5:  TOBACCO ABUSE (ICD-305.1)  Patient continues to smoke cigarettes, up to a pack per day. We again discussed the critical importance of complete smoking cessation. She has had difficulty with this over time.  Patient Instructions: 1)  Your physician recommends that you schedule a follow-up appointment in: 6 months 2)  Your physician recommends that you continue on your current medications as directed. Please refer to the Current Medication list given to you today.

## 2010-12-03 LAB — URINALYSIS, ROUTINE W REFLEX MICROSCOPIC
Bilirubin Urine: NEGATIVE
Glucose, UA: >1000 mg/dL — AB
Ketones, ur: NEGATIVE mg/dL
Protein, ur: NEGATIVE mg/dL
Urobilinogen, UA: 0.2 mg/dL (ref 0.0–1.0)

## 2010-12-03 LAB — BASIC METABOLIC PANEL
BUN: 5 mg/dL — ABNORMAL LOW (ref 6–23)
CO2: 23 mEq/L (ref 19–32)
Chloride: 104 mEq/L (ref 96–112)
Creatinine, Ser: 0.64 mg/dL (ref 0.4–1.2)
Glucose, Bld: 454 mg/dL — ABNORMAL HIGH (ref 70–99)
Potassium: 3.2 mEq/L — ABNORMAL LOW (ref 3.5–5.1)

## 2010-12-03 LAB — DIFFERENTIAL
Basophils Absolute: 0.1 10*3/uL (ref 0.0–0.1)
Eosinophils Absolute: 0.2 10*3/uL (ref 0.0–0.7)
Eosinophils Relative: 1 % (ref 0–5)
Monocytes Absolute: 0.6 10*3/uL (ref 0.1–1.0)

## 2010-12-03 LAB — CBC
HCT: 35.1 % — ABNORMAL LOW (ref 36.0–46.0)
MCH: 31.1 pg (ref 26.0–34.0)
MCV: 88.6 fL (ref 78.0–100.0)
Platelets: 263 10*3/uL (ref 150–400)
RDW: 13.2 % (ref 11.5–15.5)

## 2010-12-10 LAB — CBC
HCT: 45.6 % (ref 36.0–46.0)
Hemoglobin: 15.6 g/dL — ABNORMAL HIGH (ref 12.0–15.0)
MCHC: 34.2 g/dL (ref 30.0–36.0)
Platelets: 204 10*3/uL (ref 150–400)
RDW: 13.1 % (ref 11.5–15.5)

## 2010-12-10 LAB — BASIC METABOLIC PANEL
BUN: 9 mg/dL (ref 6–23)
CO2: 26 mEq/L (ref 19–32)
Calcium: 10.2 mg/dL (ref 8.4–10.5)
GFR calc non Af Amer: >60 mL/min (ref >60)
Glucose, Bld: 296 mg/dL — ABNORMAL HIGH (ref 70–99)
Potassium: 4.2 mEq/L (ref 3.5–5.1)
Sodium: 139 mEq/L (ref 135–145)

## 2010-12-10 LAB — POCT I-STAT 3, VENOUS BLOOD GAS (G3P V)
Acid-Base Excess: 4 mmol/L — ABNORMAL HIGH (ref 0.0–2.0)
O2 Saturation: 96 %

## 2010-12-10 LAB — GLUCOSE, CAPILLARY: Glucose-Capillary: 298 mg/dL — ABNORMAL HIGH (ref 70–99)

## 2010-12-10 LAB — DIFFERENTIAL
Basophils Absolute: 0.1 10*3/uL (ref 0.0–0.1)
Basophils Relative: 1 % (ref 0–1)
Eosinophils Absolute: 0.2 10*3/uL (ref 0.0–0.7)
Eosinophils Relative: 2 % (ref 0–5)
Monocytes Absolute: 0.5 10*3/uL (ref 0.1–1.0)
Monocytes Relative: 5 % (ref 3–12)
Neutro Abs: 5.4 10*3/uL (ref 1.7–7.7)

## 2010-12-26 ENCOUNTER — Ambulatory Visit (HOSPITAL_COMMUNITY)
Admission: RE | Admit: 2010-12-26 | Discharge: 2010-12-26 | Disposition: A | Discharge status: Home / Self Care | Payer: Medicaid Other | Source: Ambulatory Visit | Attending: Orthopedic Surgery | Admitting: Orthopedic Surgery

## 2010-12-26 ENCOUNTER — Ambulatory Visit (HOSPITAL_COMMUNITY): Payer: Medicaid Other

## 2010-12-26 DIAGNOSIS — M23319 Other meniscus derangements, anterior horn of medial meniscus, unspecified knee: Secondary | ICD-10-CM | POA: Insufficient documentation

## 2010-12-26 DIAGNOSIS — M2240 Chondromalacia patellae, unspecified knee: Secondary | ICD-10-CM | POA: Insufficient documentation

## 2010-12-26 LAB — COMPREHENSIVE METABOLIC PANEL
Alkaline Phosphatase: 73 U/L (ref 39–117)
BUN: 10 mg/dL (ref 6–23)
Chloride: 102 mEq/L (ref 96–112)
Creatinine, Ser: 0.8 mg/dL (ref 0.4–1.2)
GFR calc non Af Amer: >60 mL/min (ref >60)
Glucose, Bld: 134 mg/dL — ABNORMAL HIGH (ref 70–99)
Potassium: 4.4 mEq/L (ref 3.5–5.1)
Total Bilirubin: 0.3 mg/dL (ref 0.3–1.2)

## 2010-12-26 LAB — CBC
HCT: 42.7 % (ref 36.0–46.0)
HCT: 42.6 % (ref 36.0–46.0)
MCH: 31.2 pg (ref 26.0–34.0)
MCV: 90.7 fL (ref 78.0–100.0)
Platelets: 176 10*3/uL (ref 150–400)
RBC: 4.71 MIL/uL (ref 3.87–5.11)
RDW: 12.2 % (ref 11.5–15.5)
WBC: 9.9 10*3/uL (ref 4.0–10.5)

## 2010-12-26 LAB — APTT: aPTT: 27 seconds (ref 24–37)

## 2010-12-26 LAB — GLUCOSE, CAPILLARY: Glucose-Capillary: 132 mg/dL — ABNORMAL HIGH (ref 70–99)

## 2010-12-26 LAB — SURGICAL PCR SCREEN: Staphylococcus aureus: NEGATIVE

## 2010-12-26 LAB — BASIC METABOLIC PANEL
BUN: 10 mg/dL (ref 6–23)
Calcium: 9.1 mg/dL (ref 8.4–10.5)
Creatinine, Ser: 0.62 mg/dL (ref 0.4–1.2)
GFR calc non Af Amer: >60 mL/min (ref >60)
Glucose, Bld: 262 mg/dL — ABNORMAL HIGH (ref 70–99)

## 2010-12-26 LAB — PROTIME-INR: Prothrombin Time: 11.8 seconds (ref 11.6–15.2)

## 2010-12-28 LAB — BASIC METABOLIC PANEL
BUN: 9 mg/dL (ref 6–23)
Chloride: 106 mEq/L (ref 96–112)
Potassium: 3.6 mEq/L (ref 3.5–5.1)

## 2010-12-28 LAB — CBC
HCT: 42.2 % (ref 36.0–46.0)
MCV: 97.4 fL (ref 78.0–100.0)
Platelets: 197 10*3/uL (ref 150–400)
RBC: 4.33 MIL/uL (ref 3.87–5.11)
WBC: 9.5 10*3/uL (ref 4.0–10.5)

## 2010-12-28 LAB — GLUCOSE, CAPILLARY: Glucose-Capillary: 170 mg/dL — ABNORMAL HIGH (ref 70–99)

## 2010-12-28 NOTE — Op Note (Signed)
NAMECHINWE, LOPE NO.:  [id number]  MEDICAL RECORD NO.:  [id number]           PATIENT TYPE:  O  LOCATION:  [facility]SC                         FACILITY:  [facility]  PHYSICIAN:  Feliberto Gottron. Turner Daniels, M.D.   DATE OF BIRTH:  11-Jan-1966  DATE OF PROCEDURE:  12/26/2010 DATE OF DISCHARGE:                              OPERATIVE REPORT   PREOPERATIVE DIAGNOSIS:  Bilateral knee patellofemoral chondromalacia.  POSTOPERATIVE DIAGNOSES:  Bilateral knee patellofemoral chondromalacia, grade 3, focal grade 4 with flap tears at the lateral facet, and medial meniscal tear, anterior horn, left knee.  PROCEDURES:  Bilateral knee arthroscopic debridement of chondromalacia grade 3 focal grade 4 to the patellofemoral joint, primarily the lateral facet of the patella and lateral femoral condyle with abrasion arthroplasties.  We also performed a partial arthroscopic medial meniscectomy, anterior horn of the medial meniscus of the left knee.  SURGEON:  Feliberto Gottron. Turner Daniels, MD  FIRST ASSISTANT:  Shirl Harris, PA-C  ANESTHESIA:  General LMA.  ESTIMATED BLOOD LOSS:  Minimal.  FLUID REPLACEMENT:  800 mL of crystalloid.  DRAINS PLACED:  None.  TOURNIQUET TIME:  None.  INDICATIONS FOR PROCEDURE:  A 45 year old woman with known chondromalacia of both knees who has had multiple arthroscopies in the past.  Her last surgeries were a couple of years ago.  She got good pain relief for about 18 months and now the pain has returned.  She desires elective arthroscopic evaluation and treatment of her knee.  She is diabetic, cannot take cortisone injections and has not done well with physical therapy, antiinflammatory medicines, and knee braces.  She still has severe disabling pain and desires elective surgery to decrease pain and increase function.  Risks and benefits of surgery are well- known to the patient and were reinforced.  DESCRIPTION OF PROCEDURE:  The patient was identified by armband  and received preoperative IV antibiotics in the holding area at [facility] and taken to operating room 10.  Appropriate anesthetic monitors were attached and general LMA anesthesia induced with the patient in supine position.  Bilateral lateral posts were applied to the table and both lower extremities were then prepped and draped in usual sterile fashion from the ankle to the midthigh.  Time-out procedure performed.  On the left side we began by making standard inferomedial and inferolateral peripatellar portals.  Arthroscope was introduced through the inferolateral portal and pump pressure set between 100 and 120 mmHg.  Chondromalacia in the lateral facet of the patella was identified and debrided back to a stable margin revealing focal areas of grade 4 chondromalacia.  There were some flap tears that were debrided and abrasion arthroplasty was performed to the bone.  Grade 3 chondromalacia in the lateral femoral condyle was then identified and also debrided.  The articular surface distally in the femoral condyles and the tibial plateaus was in good condition.  Lateral meniscus was in good condition.  There was a small parrot beak tear in the anterior horn of the medial meniscus that was debrided back to a stable margin. Cruciate ligaments were identified and probed and found to be intact. Collateral  ligaments were stable.  At this point, the knee was irrigated out with normal saline solution.  The arthroscopic instruments removed and a dressing of Xeroform, 4x4 dressing sponges, Webril, and Ace wrap were applied.  In a similar fashion on the right knee, we made standard inferomedial and inferolateral peripatellar portals, inserted the arthroscope through the inferolateral portal and also identified grade 3, focal grade 4 chondromalacia to the patella with flap tears debrided back to a stable margin with a 3.5 gator sucker shaver, and in the areas where there were bare bone,  abrasion arthroplasty was performed.  Moving into the medial compartment, the menisci and articular cartilages were in excellent condition.  The cruciate ligaments were intact, and on the lateral side, the lateral compartment was also an excellent condition. The gutters were cleared medially and laterally and irrigated out with normal saline solution.  Arthroscopic instruments removed.  Dressing of Xeroform, 4x4 dressing sponges, Webril, and Ace wrap applied.  The patient was then awakened, extubated, and taken to the recovery room without difficulty.     Feliberto Gottron. Turner Daniels, M.D.     Ovid Curd  D:  12/26/2010  T:  12/27/2010  Job:  [id number]  Electronically Signed by Gean Birchwood M.D. on 12/28/2010 05:37:58 AM

## 2010-12-29 LAB — GLUCOSE, CAPILLARY
Glucose-Capillary: 141 mg/dL — ABNORMAL HIGH (ref 70–99)
Glucose-Capillary: 158 mg/dL — ABNORMAL HIGH (ref 70–99)

## 2011-01-08 LAB — GLUCOSE, CAPILLARY: Glucose-Capillary: 226 mg/dL — ABNORMAL HIGH (ref 70–99)

## 2011-02-05 NOTE — Assessment & Plan Note (Signed)
[facility]                         ELECTROPHYSIOLOGY OFFICE NOTE   BLONDINE, HOTTEL                       MRN:          [id number]  DATE:11/23/2008                            DOB:          Jan 23, 1966    [name] returns today for followup.  She is a very pleasant middle-  aged woman with symptomatic bradycardia and hypertension and obesity.  She has a history of exertional dyspnea.  She returns today for  followup.  The patient complains of peripheral edema in the afternoons.  She also complains of dyspnea on exertion.  She has some problems with  arthritis.  No other specific complaints today.  She denies chest pain  or shortness of breath.   MEDICATIONS:  1. Trazodone 100 mg nightly.  2. Zyrtec 10 mg daily.  3. Prilosec 20 a day.  4. Zocor 20 a day.  5. Glucovance 5/500 daily.  6. Lasix 20 a day.  7. Pindolol 10 a day.  8. Vitamin B12 injections per month.  9. Relafen 750 twice daily.  10.NovoLog insulin.  11.Advair 100/50 inhaler.  12.Niaspan 500 daily.  13.Aspirin 325 a day.  14.Lialda 1.2 g two tabs twice daily.   PHYSICAL EXAMINATION:  GENERAL:  She is a pleasant, obese middle-aged  woman in no distress.  VITAL SIGNS:  Blood pressure was 128/80, the pulse 68 and regular,  respirations were 18 , and the weight was 263 pounds which is up 5  pounds from her visit with Dr. Diona Browner in January.  NECK:  No jugular venous distention.  LUNGS:  Clear bilaterally to auscultation.  No wheezes, rales, or  rhonchi are present.  No increased work of breathing.  CARDIOVASCULAR: Regular rate and rhythm.  Normal S1 and S2.  ABDOMINAL:  Soft, nontender, and obese.  EXTREMITIES:  Demonstrated no peripheral edema.  There is no cyanosis or  clubbing.   Interrogation of her pacemaker demonstrates a Herbalist.  P-waves were not present secondary to sinus bradycardia.  The R waves  were 8.6, the impedance 360 in the atrium and 520 in  the ventricle, the  threshold 0.6 at 0.4 in the AF and 0.7 at 0.4 in the RV.  Battery  voltage was beginning of life.  She was 86% A paced.   IMPRESSION:  1. Symptomatic bradycardia.  2. Hypertension.  3. Status post permanent pacemaker insertion.  4. Obesity.   DISCUSSION:  I have encouraged [name] to get out walking and eat a  low-fat, low-salt diet, fresh fruits, and vegetable have been  encouraged.  She states that she will try better.  She does admit to  some dietary indiscretion with sodium.  Her pacemaker is working  normally today.  It has been for almost for just over 1 year rather.  I  will plan to see the patient back in the office for followup in 1 year.     [name]. [name], MD  Electronically Signed    GWT/MedQ  DD: 11/23/2008  DT: 11/24/2008  Job #: [id number]

## 2011-02-05 NOTE — Assessment & Plan Note (Signed)
[name]                         ELECTROPHYSIOLOGY OFFICE NOTE   Meagan, [name]                       MRN:          [id number]  DATE:11/05/2007                            DOB:          06-19-1966    HISTORY OF PRESENT ILLNESS:  [name] returns today for evaluation of  syncope.  The patient is a very pleasant middle-aged woman with a  history of syncope in the past who was subsequently found to have very  severe sinus node dysfunction and underwent pacemaker insertion several  weeks ago.  Her pain is resolved here at her pacemaker site.  The  patient states she was having marital relations with her husband and  became quite excited and subsequently passed out.  She was only out for  a few seconds.  She is not quite sure what happened.  She and her  husband are concerned that her pacemaker may be malfunctioning.   PHYSICAL EXAMINATION:  GENERAL:  Today she is a pleasant well-appearing  middle-aged woman in no distress.  VITAL SIGNS:  Blood pressure was 114/63 the pulse was 61 and regular,  respirations were 18, weight was 251 pounds.  NECK:  Revealed no jugular distention.  LUNGS:  Clear bilaterally to auscultation.  No wheezes, rales or rhonchi  are present.  Cardiovascular exam revealed a regular rate and rhythm.  Normal S1-S2.  EXTREMITIES:  Demonstrated no edema.  Her pacemaker incision was healed  nicely.   Interrogation of pacemaker demonstrates a [employer]  device which was functioning normally.   IMPRESSION:  1. History of syncope with recurrence.  2. Status post pacemaker insertion secondary to sinus node      dysfunction.  3. Hypertension.  4. Diabetes.   DISCUSSION:  Overall [name] is stable.  I am not quite sure why she  passed out, but she continues to do well and is actually feeling much  better than she did prior to her pacemaker implant.  She has cut back on  her smoking and she has actually lost  a few pounds.  I have asked her to  continue in this vein and I will see her back in the office for follow-  up as previously scheduled.  She is allowed to return to her usual  physical activity.     [name]. [name], MD  Electronically Signed    GWT/MedQ  DD: 11/05/2007  DT: 11/06/2007  Job #: [id number]

## 2011-02-05 NOTE — Discharge Summary (Signed)
[name], BUCHINGER NO.:  [id number]   MEDICAL RECORD NO.:  [id number]          PATIENT TYPE:  OIB   LOCATION:  4730                         FACILITY:  [facility]   PHYSICIAN:  Maple Mirza, PA   DATE OF BIRTH:  June 08, 1966   DATE OF ADMISSION:  10/16/2007  DATE OF DISCHARGE:  10/17/2007                               DISCHARGE SUMMARY   TIME FOR DICTATION AND EXAM:  Greater than 45 minutes.   The patient has allergy to.   FLEXERIL. DIAGNOSES:  1. Discharging day #1 status post implant of [employer] make of      it is an  ALTRUA 60 dual-chamber pacemaker.  2. Symptomatic bradycardia.  3. Sinus node dysfunction.   SECONDARY DIAGNOSES:  1. Diabetes.  2. Neurally mediated syncope.  3. Obesity.   PROCEDURE:  On October 16, 2007 implant [facility] [employer] dual-chamber  pacemaker, Dr. Lewayne Bunting and the patient has had no post-procedural  complications, although she was rather upset and combative in the  holding area but has since calmed down nicely.   BRIEF HISTORY:  [name] is a 45 year old female.  She has a history  of sinus node dysfunction.  In the past, she underwent sinus node  modification.   Since that time, she has been relatively stable.  She has had documented  bradycardia and has had pindolol discontinued.  Over the past several  days, she has had worsening spells of shortness of breath and fatigue.  She gets lightheaded but has no frank syncope.  She denies chest pain.  Previous evaluations in the past for chest pain showed no evidence of  coronary artery disease.  Chest pain in October, showed no evidence of  pulmonary embolism by CT of the chest.   The patient's main complaint now is she feels dizzy and lightheaded  intermittently and feels her heart is beating irregularly   Etiology behind the bradycardia is unclear.  We will check thyroid  function, potassium, and magnesium.  If these are within normal limits,  the patient  qualifies for pacemaker insertion.   Note, beta blockers will also will also be reinitiated.  The risks and  benefits were described to the patient and she wishes to proceed.   HOSPITAL COURSE:  The patient presented electively on January 23.  She  underwent implantation of the [facility] [employer] dual-chamber pacemaker  by Dr. [name].  She had some combative feelings and wanted to leave AMA  in the holding area but has since been calmed by the presence of her  family.  She has been a model patient since.  She has had no post-  procedural complications.  No hematoma and she is in sinus rhythm at a  rate of 60.  She will have her device interrogated with chest x-ray on  the morning of January 24.  She will be discharged on her prior  medications and perhaps to include beta blocker which she was on  previously, that would be pindolol of unknown dose.  The patient is  asked to keep her incision dry for the next 7  days and to sponge bathe  until Friday, January 30.  She is asked not to drive for 1 week.   MEDICATIONS AT DISCHARGE:  1. 1.    Tizanidine 4 mg every night at bedtime.  2. Zyrtec 10 mg daily.  3. Prilosec 20 mg daily.  4. Antivert 12.5 mg as needed.  5. Zocor 20 mg daily at bedtime.  6. Xanax 0.5 mg at bedtime.  7. Glucovance 5/500 twice daily.  8. Florinef 0.1 mg twice daily.  9. Vicodin 5/500 as previously prescribed.  10.Lasix 20 mg daily.  11.Levemir 100 units daily.  12.Advair 100/50 1 puff twice daily.  13.Albuterol inhaler as needed.  14.Nasonex 50 mcg per each nostril daily.  15.Omega-3 fatty acids daily.  16.Relafen 750 mg twice daily.  17.Nitroglycerin tabs 0.4 mg 1 tablet under the tongue each 5 minutes      x3 doses.   The patient has follow-up with pacer clinic Wednesday, February 4 at  9:40 a.m.  All visits are at [facility] on [address].  She will see Dr. [name] on Monday, April 27 at 9:15 a.m.   LABORATORY STUDIES PERTINENT TO  THIS ADMISSION.:  1. Complete blood count:  White cells 8.8, hemoglobin 15.59,      hematocrit 44.7, platelets of 234.  Protime 11.7, INR 0.9.  Sodium      139, potassium 4.4, chloride 103, bicarbonate 27, glucose 183, BUN      7, creatinine 0.7.  Magnesium is 1.8.  Calcium 9.7.  The TSH is      0.62.  These values were all within normal limits and then the      patient was definitely considered for pacemaker.      Maple Mirza, PA     GM/MEDQ  D:  10/16/2007  T:  10/17/2007  Job:  [id number]   cc:   [name]. [name], MD  Lia Hopping

## 2011-02-05 NOTE — Assessment & Plan Note (Signed)
[facility]                       [place] CARDIOLOGY OFFICE NOTE   KAMILLA, HANDS                       MRN:          [id number]  DATE:09/01/2008                            DOB:          [birth year]    CARDIOLOGIST:  Jonelle Sidle, MD   ELECTROPHYSIOLOGIST:  [name]. [name], MD   PRIMARY CARE PHYSICIAN:  Dr. Lia Hopping in [place].   REASON FOR VISIT:  To discuss her cholesterol.   HISTORY OF PRESENT ILLNESS:  Ms. 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 [name] in January 2009 secondary to symptomatic  bradycardia and pauses.  The patient had previously undergone a cardiac  catheterization by Dr. Corinda Gubler in 2002 that demonstrated normal coronary  arteries.  Her most recent ischemic evaluation included a Myoview study  done in October 2008 that demonstrated no ischemia.  Her LV function has  been normal in the past with an EF of 70% by nuclear imaging.  The  patient recently had lipids performed by Dr. Olena Leatherwood and was somewhat  concerned about these numbers.  Specifically, her total cholesterol was  210, triglycerides 388, LDL 78, and her HDL was 46.  Fish oil was  recommended to her, but she does not want to take this secondary to  concerns over nausea with this medication.  She apparently has problems  with nausea related to any type of fish.  We had a long discussion about  her lipid panel numbers.  Her LDL is fairly good.  She has never had  documented coronary artery disease, but she does have diabetes.  Her  triglycerides are definitively elevated and her HDL is somewhat low for  a female.  I, ultimately, have recommended Niaspan to her in addition to  her Zocor.  The patient also complains of some dizziness from time to  time.  This is only with moving her head to the  right.  She has a  spinning sensation and this lasts for a few minutes and then goes away.  She denies any significant nausea associated with this.  She discussed  this with her primary care physician and she was somewhat concerned that  there was a problem with her pacemaker.  She denies any chest pain or  significant shortness of breath or syncope.   CURRENT MEDICATIONS:  Trazodone 100 mg nightly, ProAir HFA 1 puff  b.i.d., Tizanidine 4 mg 2 tablets daily, Zyrtec 10 mg daily, Prilosec 20  mg daily,  Zocor 20 mg daily, Glucovance 5/500 mg b.i.d., Lasix 20 mg daily,  Pindolol 10 mg b.i.d., Levemir 30 units,  B12 injection every month, Relafen 750 mg b.i.d., NovoLog insulin 100  units with dinner, Advair 100/50 as directed, Nitroglycerin p.r.n.,  Darvocet p.r.n.   ALLERGIES:  FLEXERIL.   SOCIAL HISTORY:  She continues to smoke cigarettes.   PHYSICAL EXAMINATION:  GENERAL:  She is a well-nourished, well-  developed, obese female in no acute distress.  VITAL SIGNS:  Blood pressure is 110/80, pulse is 64, and weight 257  pounds.  HEENT:  Normal.  NECK:  Without obvious JVD.  CARDIAC:  Normal S1 and S2.  Regular rate and rhythm.  LUNGS:  Clear to auscultation bilaterally.  ABDOMEN:  Soft and nontender.  EXTREMITIES:  Trace edema bilaterally.  SKIN:  Warm and dry.  NEUROLOGIC:  She is alert and oriented x3.  Cranial nerves II-XII are  grossly intact.  Dix-Hallpike maneuver was attempted in the office and  she did have onset of spinning sensation with her head turned to the  right.  I could not appreciate any nystagmus, however.   ASSESSMENT AND PLAN:  1. Dyslipidemia.  She does have a coronary artery disease risk      equivalent with diabetes mellitus, although she had no coronary      artery disease by catheterization in 2002 and a nonischemic Myoview      in 2008.  I think her LDL is at goal at this time.  Her      triglycerides and HDL could be better.  She cannot take fish  oil      secondary to nausea.  I have, therefore, recommended placing her on      Niaspan 500 mg nightly.  She will remain on this for 4 weeks and      then increase to a 1000 mg.  I have warned her of the side effect      of flushing.  She is to take it at bedtime and she is also to take      an aspirin about 30 minutes prior to taking the medication.  2. Dizziness.  I believe that she is suffering from vertigo.  I have      discussed this with her primary care physician, Dr. Olena Leatherwood.  I      think she would benefit from vestibular rehabilitation and I have      recommended that she go for evaluation at [facility].  She is in agreement to this and we will      facilitate that referral.  3. 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  I do not think any of her dizziness was      related to pacemaker malfunction.  In any event, we will obtain      interrogation of her device, since it has been quite sometime since      she has been seen.  We will also make sure that she has a followup      with Dr. [name] in our [facility].  4. Hypertension.  This is well controlled.  5. Diabetes mellitus.  Her hemoglobin A1c is now 9.4.  Dr. Olena Leatherwood has      placed her on NovoLog insulin.  She is concerned about weight gain.      I asked her to count her calories and also to write down all the      food that she eats over 2-3 weeks.  I also recommended the [facility] Diet to her today to assist her in weight loss.   DISPOSITION:  The patient will be brought back in followup with Dr.  Diona Browner and Dr. [name] as scheduled or sooner p.r.n.      Tereso Newcomer, PA-C  Electronically Signed      Jesse Sans. Daleen Squibb, MD, [facility]  Electronically Signed   SW/MedQ  DD: 09/01/2008  DT: 09/02/2008  Job #: [id number]   cc:   Lia Hopping

## 2011-02-05 NOTE — Assessment & Plan Note (Signed)
[facility]                         ELECTROPHYSIOLOGY OFFICE NOTE   [name], [name]                       MRN:          [id number]  DATE:01/08/2008                            DOB:          Aug 06, 1966    PHONE ASSESSMENT NOTE   PRIMARY CARE PHYSICIAN:  Lia Hopping, M.D.   ELECTROPHYSIOLOGIST:  [name]. [name], M.D.   HISTORY:  This is a 45 year old female with a history of inappropriate  sinus tachycardia.  She underwent sinus node modification in June of  2004.  In the interim, until January, she had worsening bradycardia with  heart rates in the 30's and 40's.  She had been on Pindolol and she was  seen November, 2007 complaining of dizziness, presyncope and found to be  severely bradycardiac.  Her Pindolol was held.  She also carries a  diagnosis of neurally-mediated syncope.  However, she continues to have  bradycardiac problems and she underwent dual chamber pacemaker  implantation on October 16, 2007.  It was found that she had pauses up  to 3 seconds. The device is a [employer].  It was implanted by Dr. [name].  The patient was continued  on Pindolol 5 mg twice daily.  She was encouraged to increase salt  intake and she was also on Florinef.   The patient complains on phone call that she:  1. Has been severely fatigued for the last 3 weeks.  2. The pacer site, she claims, is a little puffy.  It is bruised in      color and she notes the afternoon of January 07, 2008 as she was      getting out of the shower.  3. She feels that maybe at times her heart is racing.  4. She has swollen hands, both sides.  5. Also, she is stating that her triglycerides have been found to be      very high as assessed by Dr. Olena Leatherwood and she would like our opinion      on that.  6. Her diabetes is also out of control.  This I told her could be      deferred to Dr. Olena Leatherwood.  7. She also said that her pharmacy will not  allow her to fill her      Florinef since I think they feel it is contradictory.  She is on      Pindolol, for they feel blood pressure control, and now Florinef to      increase blood pressure.  We will have to sort this out with her      pharmacy.   I have talked with our staff at [facility] and the idea would be  first to bring her in for an incision check on Monday, January 11, 2008.  She will be called about this.  She already has an appointment with Dr.  [name], January 20, 2008. If there is no pressing need for intervention or  other medical assessment before January 20, 2008 then she will see [place] on  the 20th to check her incision  site and pacer site and then follow up with Dr. [name] on the 29th.  I  guess we could get a fasting lipid profile at the time of her visit on  Monday and I will call about that and make sure we get it.      Maple Mirza, PA  Electronically Signed      [name]. [name], MD  Electronically Signed   GM/MedQ  DD: 01/08/2008  DT: 01/08/2008  Job #: [id number]

## 2011-02-05 NOTE — Assessment & Plan Note (Signed)
[facility]                       [place] CARDIOLOGY OFFICE NOTE   [name], [name]                       MRN:          [id number]  DATE:09/30/2008                            DOB:          24-Nov-1965    PRIMARY CARE PHYSICIAN:  Dr. Lia Hopping.   ELECTROPHYSIOLOGIST:  [name]. [name], MD   REASON FOR VISIT:  Recurrent syncope.   HISTORY OF PRESENT ILLNESS:  [name] was seen in the office by Mr.  Alben Spittle back in December.  She was seen at that time to discuss her  cholesterol levels and was placed on Niaspan, given  hesitancy to use  omega-3 supplements in the setting of low HDL and high triglycerides.  She was also describing some dizziness at that time that Mr. Alben Spittle felt  was consistent with possible benign positional vertigo.  She stated that  when she turned her head to the right she would feel a spinning  sensation.  She continues to report this although has also had at least  one episode of frank syncope since she was last here.  She states that  she was standing in her kitchen cooking and when she turned to the right  she reportedly passed out and was found on the floor by her family.  She  has a known history of neurally mediated syncope and sinus node  dysfunction with prior sinus node modification and is status post  placement of a dual-chamber pacemaker in January 2009.  My understanding  is that this was interrogated in December and some adjustments were  made, although I do not have the details in hand.  She states that since  these adjustments were made, she has been more fatigued and that her  dizziness and syncope have not improved.  In reviewing her chart, I note  this has been a difficult problem to manage.  In the past, she was  actually on low-dose Florinef and less of a dose of pindolol.  Today, we  repeated orthostatic measurements, and these were reassuring with a  systolic blood pressure of 139 supine, up to 159  seated, up to 165  standing, and down to 145 standing after 5 minutes.  There was no  significant change in heart rate which remains stable around 60 beats  per minute (likely paced).  She did report some dizziness when she was  sitting.  I reviewed her medication, and we talked about some  adjustments and a referral to see Dr. [name] back in the office sooner  rather than later.   ALLERGIES:  FLEXERIL.   PRESENT MEDICATIONS:  1. Trazodone 100 mg p.o. nightly.  2. ProAir HFA 1 puff b.i.d.  3. Zyrtec 10 mg p.o. daily.  4. Tizanidine 4 mg 2 tablets p.o. daily.  5. Prilosec 20 mg p.o. daily.  6. Zocor 20 mg p.o. daily.  7. Glucovance 5/500 mg p.o. b.i.d.  8. Lasix 20 mg p.o. daily.  9. Pindolol 10 mg p.o. b.i.d.  10.Levemir 30 units as directed.  11.B12 1000 mcg per month.  12.Relafen 750 mg p.o. b.i.d.  13.NovoLog 100 units at  dinner.  14.Advair 100/50 as directed.  15.Niaspan 500 mg p.o. daily.  16.Aspirin 325 mg p.o. daily.   REVIEW OF SYSTEMS:  As outlined above.  Otherwise, negative.   PHYSICAL EXAMINATION:  VITAL SIGNS:  Blood pressure 148/84, heart rate  is 60, weight is 258 pounds.  GENERAL:  This is an obese woman in no acute distress.  HEENT:  Conjunctiva is normal.  Pharynx clear with poor dentition.  NECK:  Supple.  No elevated jugular venous pressure or obvious loud  carotid bruits.  No thyromegaly.  LUNGS:  Clear with diminished breath sounds.  CARDIAC:  Regular rate and rhythm with distant heart sounds.  No S3  gallop or pericardial rub.  ABDOMEN:  Soft, nontender.  Normoactive bowel sounds.  EXTREMITIES:  No significant pitting edema.  Distal pulses are 2+.  SKIN:  Warm and dry.  MUSCULOSKELETAL:  No kyphosis noted.  NEUROPSYCHIATRIC:  The patient is alert and oriented x3.  No nystagmus  noted.  No focal weakness noted.   IMPRESSION AND RECOMMENDATIONS:  1. Documented history of neurally mediated syncope with previous sinus      node modification and  subsequent symptomatic bradycardia status      post placement of a dual-chamber pacemaker by Dr. [name] in January      2009.  Some recent adjustments were made in December by our      Electrophysiology Team and the patient denies having any      improvement in baseline symptoms of dizziness and also recently      recurrent syncope.  There were some concerns about possible benign      positional vertigo following her last visit with Mr. Alben Spittle and she      was actually referred for occupational therapy.  She tells me that      it was not felt that she clearly had benign positional vertigo      based on that assessment.  She does describe the onset of dizziness      when she turns her head to the right and I suppose she could have      some carotid sinus hypersensitivity. We discussed the matter, and      what I would like to try is to place her back on Florinef at 0.1 mg      daily and back down pindolol to 5 mg p.o. b.i.d.  She will be      scheduled to see Dr. [name] in the office this month here in      [place] for further management.  2. Previously documented normal coronary arteries with no active chest      pain.  Risk factor modification has been recommended.  She      continues on aspirin and is undergoing therapy for hyperlipidemia      including now Zocor and Niaspan.  The plan is to ultimately      increase her      Niaspan to 1000 mg daily, and she will need a followup lipid      profile and liver function tests over the next several weeks.     Jonelle Sidle, MD  Electronically Signed    SGM/MedQ  DD: 09/30/2008  DT: 10/01/2008  Job #: [id number]   cc:   Ramonita Lab. [name], MD

## 2011-02-05 NOTE — H&P (Signed)
[facility] ADMISSION   Meagan, [name] [name]                       MRN:          [id number]  DATE:07/16/2007                            DOB:          [birth year]    PRIMARY CARE PHYSICIAN:  Lia Hopping, M.D.   ELECTROPHYSIOLOGIST:  [name]. [name], MD.   REASON FOR ADMISSION:  New onset chest pressure, back discomfort and  diaphoresis.   HISTORY OF PRESENT ILLNESS:  [name] is a 45 year old obese woman  with diabetes mellitus, hypertension, hyperlipidemia and previously  documented normal coronary arteries at cardiac catheterization in 2002.  She has continued to smoke cigarettes since that time and also has a  history of neurally mediated syncope with previous inappropriate sinus  tachycardia status post sinus node modification by Dr. [name] in 2004.  She has been treated with Pindolol and was last seen by Dr. [name] in  May.  She was added on urgently to my clinic today given symptoms of  chest pain, back discomfort and diaphoresis.  In speaking with the  patient, he states that Tuesday when she was driving her son in the car,  she suddenly developed a discomfort in her back between her shoulder  blades.  This was followed by a feeling of chest pressure, causing her  to pull the car over to the side.  She has since that time, continued to  have these symptoms intermittently and in a progressive fashion, also  associated with left arm discomfort and tingling.  She states that she  saw Dr. Olena Leatherwood on Tuesday, but did not mention these symptoms.  In the  office today, she complains of chest pressure and her electrocardiogram  shows sinus rhythm at 81 beats per minute with nonspecific STT wave  changes.  She, otherwise is hemodynamically stable with systolic blood  pressure in the 140s and oxygen saturation of 97% on room air.   ALLERGIES:  FLEXERIL.   PRESENT MEDICATIONS:  1. Tizanidine 4 mg p.o.  nightly.  2. Zyrtec 10 mg p.o. nightly.  3. Prilosec 20 mg p.o. daily.  4. Antevert 12.5 mg p.o. p.r.n.  5. Zocor 20 mg p.o. nightly.  6. Xanax 0.5 mg p.o. nightly.  7. Glucovance 5/500 mg p.o. b.i.d.  8. Lasix 20 mg p.o. daily.  9. Pindolol 5 mg p.o. b.i.d.  10.Levemir 100 units daily.  11.Cyanocobalamin 1000 mcg monthly.  12.Advair 100/50 one puff b.i.d.  13.Albuterol inhaler one puff daily.  14.Nasonex 50 mcg daily.  15.Omega III supplements.  16.Relafen 750 mg p.o. b.i.d.  17.Vicodin 5/500 two tablets p.o. q.12h. p.r.n.   PAST MEDICAL HISTORY:  As outlined above.   SOCIAL HISTORY:  Patient is married and lives in [place].  Her  husband is here today and speaks little [place].  She has an active  tobacco use history, denies any alcohol use.   FAMILY HISTORY:  Reviewed and includes premature cardiovascular disease.   REVIEW OF SYSTEMS:  As described in history of present illness.  Patient  has a chronic cough, not changed recently.  She has  had no hemoptysis,  no orthopnea or PND.  She has intermittent palpitations.  She has had no  frank syncope.  She has complained of some hand swelling.  No fever or  chills.   PHYSICAL EXAMINATION:  VITAL SIGNS:  Blood pressure 142/82, heart rate  73, weight 252 pounds.  GENERAL APPEARANCE:  This is an obese woman in no acute distress,  although complaining of chest pain and tearful.  HEENT:  Conjunctivae and lids normal.  Oropharynx is clear.  NECK:  Supple.  No elevated jugular venous pressure, no loud bruits, no  thyromegaly.  LUNGS:  Clear.  No labored breathing, no wheezing.  CARDIOVASCULAR:  Regular rate and rhythm, no S3 gallop or pericardial  rub.  ABDOMEN:  Obese, could not palpate liver edge.  Bowel sounds present.  EXTREMITIES:  Trace edema.  SKIN:  Warm and dry.  MUSCULOSKELETAL:  No kyphosis is noted.  NEUROPSYCHIATRIC:  The patient is alert and oriented x3.  She is  anxious.   IMPRESSION:  1. Recent onset  back discomfort with chest pressure, left arm      discomfort and diaphoresis.  Symptoms actually began on Tuesday and      have waxed and waned since that time becoming more intense per      patient report.  Her electrocardiogram at this point shows only      nonspecific ST segment changes.  I do note that she had normal      coronary arteries at catheterization in 2002, although has multiple      risk factors including ongoing tobacco use, obesity, diabetes      mellitus and hyperlipidemia.  I discussed this with her and also      her husband and have recommended admission to the hospital via EMS      for further assessment.  She is in agreement.  2. History of inappropriate sinus tachycardia status post sinus node      modification by Dr. [name] in 2004.  The patient also has neurally      mediated syncope, treated with Pindolol at this point.  She is not      particularly bradycardic and has had no episodes of syncope.   PLAN:  The patient is being admitted to telemetry at [facility] via EMS.  Will plan to continue her on medications  except to hold metformin for the time being.  Full labs will be  obtaining including cycling of cardiac markers.  We will proceed with a  CT scan of the chest to exclude dissection or pulmonary embolism,  although I doubt these etiologies.  Heparin will then be initiated and  if she rules out for myocardial infarction, further inpatient ischemic  evaluation could be entertained.  I suspect that if her symptoms  resolve, she may be able to have a Myoview tomorrow.  On the other hand,  if she has abnormal cardiac markers or continues to have symptomatology,  would give strong consideration to a repeat diagnostic catheterization.  Otherwise, we will monitor her rhythm on telemetry to exclude any tachy  or bradyarrhythmias.  Lipids will be repeated.  Further plans to follow.     Jonelle Sidle, MD  Electronically Signed     SGM/MedQ  DD: 07/16/2007  DT: 07/16/2007  Job #: [id number]   cc:   Ramonita Lab. [name], MD

## 2011-02-05 NOTE — Assessment & Plan Note (Signed)
[facility]                         ELECTROPHYSIOLOGY OFFICE NOTE   TALLULA, GRINDLE                       MRN:          [id number]  DATE:10/20/2007                            DOB:          January 11, 1966    [name] returns today 1 week after her pacemaker was implanted for  concerns about her pacemaker pocket and healing.  She has concerns that  she might have infection.  She notes some minimal tenderness over the  site.  There has been no drainage.  The patient has otherwise been  stable.  Physical exam demonstrates a pacemaker incision which is  healing nicely.  Overall, the patient feels better.   IMPRESSION:  1. Symptomatic bradycardia.  2. Status post pacemaker insertion.   DISCUSSION:  Overall, [name] is stable.  She notes that she has more  energy.  Her pacemaker insertion site appears to be healing nicely.  We  will see her back in the office next week.     [name]. [name], MD  Electronically Signed    GWT/MedQ  DD: 10/20/2007  DT: 10/21/2007  Job #: [id number]

## 2011-02-05 NOTE — Assessment & Plan Note (Signed)
[facility]                         ELECTROPHYSIOLOGY OFFICE NOTE   TRENELL, MOXEY                       MRN:          [id number]  DATE:01/12/2008                            DOB:          1966-02-16    [name] returned today for follow-up.  She is a very pleasant woman  with a history of a neurally-mediated syncope but also sinus bradycardia  and sinus node dysfunction, hypertension, dyslipidemia, diabetes and  obesity.  She returns today for follow-up.  She complains of some  soreness at her pacemaker insertion site, for which she has had her  pacemaker now for 3 months.  She notes her blood pressure remains  elevated, as is her triglycerides.   CURRENT MEDICATIONS:  1. Prilosec 20 mg a day.  2. Zocor 20 a day.  3. Glucovance 5/500 mg twice daily.  4. Lasix 20 mg daily.  5. Pindolol 5 mg twice daily.  6. Advair 100/50 mg twice daily.   PHYSICAL EXAM:  She is a pleasant, well-appearing, obese middle-aged  woman in no distress.  Blood pressure was 144/83, the pulse 60 and regular, respirations were  18.  The weight was 260 pounds.  NECK:  No jugular venous distention.  LUNGS:  Clear bilaterally to auscultation.  No wheezes, rales or rhonchi  are present.  CARDIOVASCULAR:  Regular rate and rhythm with a normal S1 and S2.  EXTREMITIES:  Trace peripheral edema bilaterally.  Her pacemaker insertion site was healed nicely.   Interrogation of her pacemaker demonstrates a [employer].  The P waves were 0.7, the R waves 9, the impedance 370 in the atrium and  500 in the ventricle, threshold of 0.6 at 0.4 in the A and 0.7 at 0.4 in  the V.  She was 74% A-paced.  Today we decreased her outputs to 2 at 0.4  in the A and 2.5 at 0.4 in the RV.   IMPRESSION:  1. Symptomatic bradycardia, status post pacemaker insertion.  2. History of neurally-mediated syncope.  3. Hypertension.  4. Diabetes.  5. Dyslipidemia.   DISCUSSION:  Ms.  Deprey and I discussed a host of issues today regarding  her hypertension and its control, and for this we have increased her  pindolol.  We also talked about her neurally-mediated syncope and that  it is improved and we will not start her back on any Florinef for now.  We have also talked about her dyslipidemia and I have asked that she  continue her Zocor and  that she start walking on a regular basis, which will help with her  diabetes control, which will help her dyslipidemia.  We will see her  back in 6 months.     [name]. [name], MD  Electronically Signed    GWT/MedQ  DD: 01/12/2008  DT: 01/12/2008  Job #: [id number]   cc:   Lia Hopping

## 2011-02-05 NOTE — Assessment & Plan Note (Signed)
[facility]ALTHCARE                          [place] CARDIOLOGY OFFICE NOTE   [name], Meagan                       MRN:          [id number]  DATE:04/19/2008                            DOB:          13-Jul-1966    ELECTROPHYSIOLOGIST:  [name]. [name], MD   PRIMARY CARE PHYSICIAN:  Dr. Lia Hopping.   ORTHOPEDIC SURGEON:  Feliberto Gottron. Turner Daniels, MD   I was notified via fax that [name] is being considered for left knee  surgery on April 25, 2008.  I do not have the details regarding the  specifics of this operation but presume that she is having a knee  replacement under general anesthesia.  We are asked to discuss her  cardiac clearance for surgery.  I actually last saw her in our  [place] office back on February 25, 2008.  She has been stable from a  cardiac perspective.  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 [name] due to symptomatic bradycardia  and pauses.  She has done well tolerating pindolol and was not having  any problems with chest pain, significant palpitations, or syncope as of  her last visit.  From an ischemic perspective, she underwent an  outpatient Myoview back in October 2008 which revealed a normal left  ventricular ejection fraction of 70% with no evidence of ischemia.  Presuming that she has not developed any new symptomatology since her  last visit, I would anticipate that she should be able to proceed with  planned surgery without any additional cardiac testing.  Keep in mind  that she has a pacemaker in place [facility] dual-chamber  device), and this will need to be interrogated postoperatively.  She  should be continued on her present medications including pindolol.  We  would recommend telemetry monitoring around the time of surgery.  Otherwise, we can certainly see her as an inpatient if the clinical  situation  warrants.     Jonelle Sidle, MD  Electronically Signed    SGM/MedQ  DD: 04/19/2008  DT: 04/20/2008  Job #: [id number]   cc:   [name]. [name], MD  Lia Hopping  Feliberto Gottron. Turner Daniels, M.D.

## 2011-02-05 NOTE — Assessment & Plan Note (Signed)
[facility]                         ELECTROPHYSIOLOGY OFFICE NOTE   NAME:ORTEGABobi, Daudelin                       MRN:          [id number]  DATE:01/07/2008                            DOB:          March 28, 1966    CONTINUATION:  I was listing all the problems that she had related on  the telephone.  1. Her sugar is out the roof and Dr. Olena Leatherwood has been asked to take      care of that.  I thought she could see Dr. Olena Leatherwood for that.  2. Dr. Olena Leatherwood says that her triglycerides are high and that she      should consult with [place] in cardiology about that.  I am going to ask      that we have her come in a little early, like the week of April      20th, just to take a look at the incision site and to ask her some      of these questions.  It is possible that she is severely      bradycardic.  Maybe interrogation of the pacemaker will tell us      something.      Maple Mirza, PA  Electronically Signed      [name]. [name], MD  Electronically Signed   GM/MedQ  DD: 01/07/2008  DT: 01/07/2008  Job #: [id number]

## 2011-02-05 NOTE — Assessment & Plan Note (Signed)
[facility] HEALTHCARE                       [place] CARDIOLOGY OFFICE NOTE   KADANCE, MCCUISTION                       MRN:          [id number]  DATE:02/15/2009                            DOB:          Nov 27, 1965    PRIMARY CARE PHYSICIAN:  Dr. Lia Hopping.   ELECTROPHYSIOLOGIST:  [name]. [name], MD   REASON FOR VISIT:  Fatigue and shortness of breath.   HISTORY OF PRESENT ILLNESS:  I saw [name] back in January.  Her  history is detailed in the previous note, also including that of Dr.  [name] back in March, at which time she had normal pacemaker function.  She has had a fairly persistent history of generalized fatigue, stress,  and shortness of breath.  From a cardiac perspective, she has undergone  previous cardiac catheterization with documentation of normal coronary  arteries and overall normal left ventricular systolic function.  She has  had neurally mediated syncope with sinus node modification and  symptomatic bradycardia and is status post dual-chamber pacemaker  placement by Dr. [name] with normal function and a predominately atrial-  paced rhythm.  I made some adjustments in her therapy last time  including a decrease in her pindolol to 5 mg twice daily and the  addition of Florinef 0.1 mg daily given reported recurrent syncope.  She  states that these interventions did not necessarily change her  symptomatology (although she has had no syncope), in fact if anything  she states that she feels better on the higher dose pindolol.  She is  referred to me today after being evaluated by the pacemaker team in  [place].  The patient was under the impression that she was found to  have low heart rates that may be contributing to her fatigue and was  therefore sent to me to discuss this.  I spoke with Gunnar Fusi and clarified  that in fact the patient had normal pacemaker function without any  problems with low heart rates.  I reviewed this with the  patient in the  office today, with her husband as well.  My sense is that [name]'s  symptoms are multifactorial, contributed to by obesity with poor  exercise tolerance, poorly controlled diabetes mellitus (recent  hemoglobin A1c of 9.6%), and significant psychosocial stressors within  her family.  I reviewed recommendations for diet, perhaps with referral  to a nutritionist, increased walking regimen, and risk factor  modification strategies for medical therapy.   ALLERGIES:  FLEXERIL.   Present medications include:  1. Trazodone 100 mg p.o. at bedtime  2. ProAir HFA b.i.d.  3. Tizanidine 4 mg 2 tablets p.o. daily.  4. Zyrtec 10 mg p.o. daily.  5. Prilosec 20 mg p.o. daily.  6. Zocor 20 mg p.o. daily.  7. Glucovance 5/500 mg p.o. t.i.d.  8. Lasix 20 mg p.o. daily.  9. Pindolol 10 mg p.o. b.i.d.  10.Levemir 40 units b.i.d.  11.Vitamin B12 1000 mcg injections monthly.  12.Relafen 750 mg p.o. b.i.d.  13.NovoLog 15 units at bedtime.  14.Advair 100/50 daily.  15.Niaspan 1000 mg daily.  16.Aspirin 325 mg daily.  17.Reglan  10 mg p.o. q.a.c. and bedtime.  18.Sublingual nitroglycerin 0.4 mg p.r.n.  19.Pravastatin 100 p.r.n.  20.Lialda 1.2 g daily.  21.Florinef 0.1 mg daily.   REVIEW OF SYSTEMS:  Outlined above.  She does continue to smoke  cigarettes.  We talked about smoking cessation on a number of occasions.  This likely also contributes to her shortness of breath.  She reports  that she is trying to eat only one meal a day skipping breakfast and  lunch, some feeling of early satiety, otherwise no changes in bowel or  bladder habits.  Systems reviewed and negative otherwise.   PHYSICAL EXAMINATION:  VITAL SIGNS:  Blood pressure is 150/84, heart  rate is 78, weight is 257 pounds which is down from 263 at her last  visit.  GENERAL:  This is an obese woman in no acute distress.  HEENT:  Conjunctiva is normal.  Oropharynx is clear.  Poor dentition.  NECK:  Supple.  Increased  girth.  No obvious jugular venous pressure.  No loud bruits.  LUNGS:  Diminished breath sounds.  No wheezing.  CARDIAC:  A regular rate and rhythm.  No pericardial rub or pathologic  systolic murmur.  ABDOMEN:  Obese, unable to palpate liver edge.  Bowel sounds present.  EXTREMITIES:  Venous stasis.  There is no significant pitting edema.  Distal pulses are 2+.  SKIN:  Warm and dry.  MUSCULOSKELETAL:  No kyphosis noted.  NEUROPSYCHIATRIC:  The patient is alert and oriented x3.   IMPRESSION AND RECOMMENDATIONS:  Generalized fatigue, shortness of  breath, and anxiety.  This is likely multifactorial in the setting of  obesity with relative inactivity, ongoing tobacco abuse, poorly  controlled diabetes mellitus, and significant psychosocial stressors.  She has had a reassuring cardiac workup over the years in general with  previously documented normal coronary arteries and normal left  ventricular systolic function.  Her neurally mediated syncope and  previous sinus node dysfunction has been followed by Dr. [name], and the  patient has normal function of her [facility] scientific dual-chamber pacer  with an atrial paced rhythm most of the time.  She had a recent device  check and was not found to have any bradycardia or device dysfunction  and I doubt that this is contributing to her symptomatology.  She states  that she actually felt worse on less pindolol and therefore I will have  her continue 10 mg twice daily.  She would like to stop the Florinef as  well.  I am referring [name] to a nutritionist to better understand  her diet pattern and help her with this.  I have also recommended a  basic walking regimen.  She may even need to consider evaluation by a  behavioral health specialist to help her deal with some of her  psychosocial stressors and anxiety.  Perhaps her primary care physician,  Dr. Olena Leatherwood, could help to facilitate this.  From a cardiac perspective,  no additional   testing is planned at this point.  We will see her back for routine  assessment over the next 6 months.     Jonelle Sidle, MD  Electronically Signed    SGM/MedQ  DD: 02/15/2009  DT: 02/16/2009  Job #: [id number]   cc:   Ramonita Lab. [name], MD

## 2011-02-05 NOTE — Op Note (Signed)
[name], [name]                ACCOUNT NO.:  [id number]   MEDICAL RECORD NO.:  [id number]          PATIENT TYPE:  AMB   LOCATION:  SDS                          FACILITY:  [facility]   PHYSICIAN:  Feliberto Gottron. Turner Daniels, M.D.   DATE OF BIRTH:  June 04, 1966   DATE OF PROCEDURE:  03/09/2007  DATE OF DISCHARGE:  03/09/2007                               OPERATIVE REPORT   PREOPERATIVE DIAGNOSIS:  Bilateral chondromalacia patella of the knee.   POSTOPERATIVE DIAGNOSIS:  Bilateral chondromalacia patella of the knee  with the addition of intra-articular cartilaginous loose bodies.   PROCEDURE:  Bilateral knee arthroscopy with removal of chondromalacia  grade 3 focal grade 4 flap tears of both knees and removal of loose  bodies to both knees.   SURGEON:  Feliberto Gottron. Turner Daniels, M.D.   ASSISTANT:  Skip Mayer PA-C.   ANESTHETIC:  General endotracheal.   ESTIMATED BLOOD LOSS:  Minimal.   FLUID REPLACEMENT:  800 mL crystalloid.   DRAINS PLACED:  None.   TOURNIQUET TIME:  None.   INDICATIONS FOR PROCEDURE:  45 year old woman has had multiple  arthroscopies of both knees in the past has recurrent catching, popping  and pain in both knees and desires elective arthroscopic evaluation and  treatment.  She says she is allergic to cortisone shots and can have no  further cortisone injections.  She has failed conservative treatment  with anti-inflammatory medicines, exercise, and observation.  Risks and  benefits of surgery discussed, questions answered.   DESCRIPTION OF PROCEDURE:  The patient identified by armband, taken to  the operating room at [facility] where the surgery was done  secondary to body habitus and the appropriate anesthetic monitors were  attached and general endotracheal anesthesia induced.  Bilateral lateral  posts were applied to the table and then both lower extremities were  prepped and draped in usual sterile fashion from the ankle to the mid  thigh. Both knees then  received injections of Marcaine and epinephrine  into the parapatellar portal regions, 3 mL into the medial,  inferomedial, inferolateral portals and another 5 mL into each joint.  Beginning on the right side we went ahead and made standard  inferomedial, inferolateral peripatellar portals allowing introduction  of the arthroscope through the inferolateral portal and the outflow  through the inferomedial portal.  We immediately encountered  chondromalacia patella grade 4 to pretty much the whole lateral facette  and there were flap tears which were also debrided.  Moving into the  medial compartment we identified a loose body about 4-5 mm in size and  this was removed with a 3.5 gator sucker shaver.  The menisci in both  sides were thoroughly probed and found to be intact.  The ACL and PCL  are intact and the articular cartilage of the medial lateral compartment  had little if any chondromalacia.  The gutters were cleared medially and  laterally.  The water drained from the knee and a dressing of Xeroform,  4x4 dressing sponges, Webril and Ace wrap applied.  Please note the  patient did receive 2 grams of Ancef  IV prior to the start of the  procedure.  We then directed our attention to the left knee and made  similar inferomedial, inferolateral portals. The arthroscope was  introduced into the inferolateral portal and the outflow through the  inferomedial portal.  Diagnostic arthroscopy revealed grade 4  chondromalacia with flap tears, less so on the left side than the right  and then moving into the medial compartment.  Once again we found a  larger loose body that was about a centimeter in length, 3-4 mm in width  and depth and this was extracted with the 3.5 gator sucker shaver. This  was floating throughout the knee and certainly could have been the cause  of the pain she was having. Once again the cruciate ligaments were  intact.  The articular and meniscal cartilages were in good  shape on the  left side with the exception of lateral meniscus which had some minor  fraying along the mid lateral edge and this was debrided back to stable  margin.  At this point the knee was thoroughly irrigated out with normal  saline solution.  The arthroscopic instruments were removed and a  dressing of Xeroform, 4x4 dressing, sponges, Webril and Ace wrap  applied. The patient was then awakened and taken to the recovery room  without difficulty.      Feliberto Gottron. Turner Daniels, M.D.  Electronically Signed     FJR/MEDQ  D:  03/09/2007  T:  03/09/2007  Job:  [id number]

## 2011-02-05 NOTE — Assessment & Plan Note (Signed)
[facility] HEALTHCARE                            CARDIOLOGY OFFICE NOTE   MARKESIA, CRILLY                       MRN:          [id number]  DATE:07/29/2007                            DOB:          01-Mar-1966    PRIMARY CARE PHYSICIAN:  Lia Hopping, MD.   ELECTROPHYSIOLOGIST:  [name]. [name], MD.   REASON FOR VISIT:  Post hospitalization followup.   HISTORY OF PRESENT ILLNESS:  I saw [name] in the office back in late  October.  She presented at that time with recent onset back and chest  pressure.  I admitted her to the hospital for observation, and she ruled  out for myocardial infarction.  She had a CT scan of the chest  performed, which revealed no acute thoracic abnormalities (no dilatation  or dissection of the aorta).  She was seen by Dr. Dietrich Pates and was  discharged home with plans for a followup Myoview.  This was performed  on the 27th of October and was normal showing an ejection fraction of  70% and no evidence of ischemia.  I reviewed these issues with the  patient today and reassured her.  She does point out that she has been  under a lot of stress with her family and children.   Of  note, she was found to have asymptomatic, three-second pauses on  telemetry during her hospital stay.  She was taken off of Pindolol.  I  asked her about this today and she states that she has not noticed any  difference being off of the Pindolol.  She has had no increasing sense  of palpitations and has had no dizziness or syncope.  She does have a  history of autonomic dysfunction including inappropriate sinus  tachycardia with previous sinus node modification in 2004 by Dr. [name].   ALLERGIES:  FLEXERIL.   PRESENT MEDICATIONS:  1. Florinef 0.1 mg p.o. daily.  2. Tizanidine 4 mg p.o. q.h.s.  3. Zyrtec 10 mg p.o. q.h.s.  4. Prilosec 20 mg p.o. daily.  5. Antivert 12.5 mg p.o. p.r.n.  6. Zocor 20 mg p.o. q.h.s.  7. Glucovance 5/500 mg p.o. b.i.d.  8. Lasix 20 mg p.o. daily.  9. Levemir 100 units as directed.  10.Cyanocobalamin 1000 mg monthly.  11.Advair 100/50 one puff b.i.d.  12.Nasonex 50 mcg daily.  13.Omega-3 supplements.  14.Vicodin p.r.n.  15.Nitroglycerin p.r.n.  16.Relafen p.r.n.   REVIEW OF SYSTEMS:  As described in the History of Present Illness.   PHYSICAL EXAMINATION:  VITAL SIGNS:  Blood pressure today is 143/92,  heart rate is 95, weight is 253 pounds.  GENERAL:  This is an overweight woman in no acute distress.  HEENT:  Conjunctivae and lids normal, oropharynx is clear.  NECK:  Supple, no elevated jugulovenous pressure, no carotid bruits, no  thyromegaly is noted.  LUNGS:  Clear, diminished breath sounds, no wheezing or labored  breathing.  CARDIAC:  Regular rate and rhythm, no loud murmur or gallop.  ABDOMEN:  Soft and nontender.  EXTREMITIES:  No significant pitting edema.  SKIN:  Warm and dry.  MUSCULOSKELETAL:  No kyphosis is noted.  NEUROPSYCHIATRIC:  The patient is alert and oriented x3, affect is  normal.   IMPRESSION AND RECOMMENDATIONS:  1. Recent episode of chest pain.  The patient was admitted to the      hospital for observation and ruled out for myocardial infarction.      She had a computerized tomography scan of her chest showing no      evidence of aortic dissection or dilatation and subsequently an      outpatient Myoview that was completely normal showing no evidence      of ischemia with an ejection fraction of 70%.  I reassured the      patient today.  She had previously documented normal coronary      arteries at catheterization in 2002.  It may well be that she is      having symptoms related to psychosocial stress.  She also has a      more chronic history of palpitations and bradycardia with what      appear to be asymptomatic pauses.  She has been taken off of      Pindolol, which I have asked her to comply with for now.  Certainly      if she starts having more palpitations,  she might be able to use      her Pindolol on a p.r.n. basis.  I reviewed this with Dr. [name]      and he was in agreement.  Otherwise, we will plan basic followup of      symptoms over the next six months, and she will continue to see Dr.      Olena Leatherwood more regularly.  2. Refills were provided for Zocor, Prilosec, and Lasix.     Jonelle Sidle, MD  Electronically Signed    SGM/MedQ  DD: 07/29/2007  DT: 07/29/2007  Job #: [id number]   cc:   Ramonita Lab. [name], MD

## 2011-02-05 NOTE — Assessment & Plan Note (Signed)
[facility] HEALTHCARE                            CARDIOLOGY OFFICE NOTE   [name], [name]                       MRN:          [id number]  DATE:02/25/2008                            DOB:          [birth year]    ELECTROPHYSIOLOGIST:  Dr. Lewayne Bunting.   PRIMARY CARE PHYSICIAN:  Dr. Lia Hopping.   REASON FOR VISIT:  Routine cardiac follow-up.   HISTORY OF PRESENT ILLNESS:  [name] was seen in the office by Dr.  [name] back in April.  Since I last saw her in November, she is now  status post placement of a [employer] dual-chamber pacemaker with  concerns about symptomatic bradycardia and neurally mediated syncope,  status post previous sinus node modification.  This procedure was done  in January 2009.  She was noted to have pauses at that time.  She is now  back on her Pindolol and off of Florinef, and actually doing fairly  well.  She has had no syncope.  Her blood pressure has actually trended  upward to some degree.  Today's electrocardiogram shows a probable  atrial paced rhythm at 60 beats per minute.  She is due for follow-up  liver function and lipid test.  Her medicines are outlined below.  She  is not reporting any angina.  Prior ischemic assessment has all been  reassuring.   ALLERGIES:  FLEXERIL.   PRESENT MEDICATIONS:  1. Trazodone 100 mg p.o. nightly.  2. ProAir inhaler b.i.d.  3. Pindolol 5-10 mg p.o. b.i.d.  4. Vitamin B12 1000 mcg monthly.  5. Levemir 100 units as directed.  6. Lasix 20 mg p.o. daily.  7. Glucovance 5/500 mg p.o. b.i.d.  8. Zocor 20 mg p.o. nightly.  9. Prilosec 20 mg p.o. daily.  10.Zyrtec 10 mg p.o. nightly.  11.Tizanidine 4 mg p.o. nightly.   REVIEW OF SYSTEMS:  As described in history of present illness.  She has  been working on weight loss.  She is down from 260-249.  She continues  to smoke.  We talked about smoking cessation today.  Otherwise negative.   PHYSICAL EXAMINATION:  VITAL SIGNS:   Blood pressure is 128/90, heart  rate 60 weight 249 pounds.  GENERAL:  The patient is comfortable in no acute distress.  HEENT:  Conjunctivae normal.  Oropharynx clear.  NECK:  Supple.  No elevated jugulovenous pressure.  No loud bruits.  LUNGS:  Clear without labored breathing.  CARDIAC:  Reveals a regular rate and rhythm.  No S3 gallop or loud  murmur.  EXTREMITIES:  Exhibit trace edema.   IMPRESSION/RECOMMENDATIONS:  1. History of hyperlipidemia, on statin therapy.  We will plan a      follow-up lipid profile and liver function tests.  2. History of neurally mediated syncope, previous sinus node      modification, now status post dual-chamber pacemaker placement due      to symptomatic bradycardia and pauses.  She is doing much better      symptomatically and has regular follow-up with Dr. [name]      scheduled.  She continues on pindolol.  3. Prior history of chest pain with no clear evidence of obstructive      coronary disease or myocardial infarction.  We plan risk factor      modification and observation.  I will plan to see her back in our      [place] office over the next 6 months.     Jonelle Sidle, MD  Electronically Signed    SGM/MedQ  DD: 02/25/2008  DT: 02/25/2008  Job #: [id number]   cc:   [name]. [name], MD  Lia Hopping

## 2011-02-05 NOTE — Letter (Signed)
September 27, 2009     RE:  MAUDENE, STOTLER  MRN:  [id number]  /  DOB:  17-Oct-1965   To whom it may concern:   Ms. Dearman is under my care for gastroparesis and Crohn disease.  She  has ongoing gastrointestinal complaints rendering her unfit for travel  to [place].    Sincerely,      Barbette Hair. Arlyce Dice, MD,FACG  Electronically Signed    RDK/MedQ  DD: 09/27/2009  DT: 09/27/2009  Job #: [id number]

## 2011-02-05 NOTE — Discharge Summary (Signed)
Meagan, [name] NO.:  [id number]   MEDICAL RECORD NO.:  [id number]          PATIENT TYPE:  INP   LOCATION:  4731                         FACILITY:  [facility]   PHYSICIAN:  Meagan Friends. Dietrich Pates, MD, FACCDATE OF BIRTH:  Jan 06, 1966   DATE OF ADMISSION:  07/16/2007  DATE OF DISCHARGE:  07/17/2007                               DISCHARGE SUMMARY   PRIMARY CARDIOLOGIST:  Dr. Nona [name].   ELECTROPHYSIOLOGIST:  Dr. Lewayne [name].   PRIMARY CARE PHYSICIAN:  Dr. Olena [name] in [place].   DISCHARGE DIAGNOSIS:  Chest pain.   SECONDARY DIAGNOSES:  1. Obesity.  2. Type 2 diabetes mellitus.  3. Hypertension.  4. Hyperlipidemia.  5. History of normal coronary arteries by catheterization in 2002.  6. Ongoing tobacco abuse.  7. 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      Ladona [name] in 2004.   ALLERGIES:  FLEXERIL.   PROCEDURES:  None.   HISTORY OF PRESENT ILLNESS:  A 45 year old obese, Caucasian female with  the above problem list, who was in her usual state of health until  earlier this week while driving she had a sudden onset of chest and back  discomfort associated with diaphoresis.  She has had intermittent  symptoms since that time, and saw Dr. Diona [name] in clinic on October 23,  at which time she complained of recurrent of discomfort.  ECG showed no  acute changes, and she was taken via EMS to [facility] for evaluation  with plan for an inpatient evaluation.   HOSPITAL COURSE:  [name] has ruled out for an MI and is currently  pain free.  She wishes to go home, and has threatened to leave AMA.  She  was seen by Dr. Dietrich [name] this morning, and it was felt that she could be  discharged with outpatient Myoview on October 27.  She will be  discharged home today in satisfactory condition.  She has been counseled  on the importance of smoking cessation, although she shows no  motivation  to quit.  [name] was noted to have 3-second pauses while on  telemetry and while awake.  These were asymptomatic.  This occurred on 2  occasions.  Her pindolol, which she had previously taken, was  discontinued, and she has not had any recurrence of bradycardia.  She  has follow up with Dr. Ladona [name] in 2 weeks.   DISCHARGE LABORATORY DATA:  Hemoglobin 13.8, hematocrit 40.6, WBC 9.2,  platelets 233.  PT 13.2, INR 1.0.  Sodium 139, potassium 3.9, chloride  109, CO2 of 22, BUN 9, creatinine 0.87, glucose 275.  Total bilirubin  0.6, alkaline phosphatase 84, AST 43, ALT 41, total protein 6.3, albumin  3.7, calcium 9.1.  CK 64, MB 1.2, troponin-I 0.01.  Total cholesterol  154, triglycerides 141, HDL 35, LDL 91.  TSH is pending.   DISPOSITION:  The patient is being discharged home today in good  condition.   FOLLOWUP PLANS AND APPOINTMENTS:  She has follow up adenosine Myoview on  Monday, October 27, at 7:45 a.m. at our [place] office.  She has  followup with Dr. Diona [name] scheduled for November 3, at 11 a.m.  She is  to follow up with Dr. Lewayne [name] on the same day at 9:45 a.m.   DISCHARGE MEDICATIONS:  1. Tizanidine 4 mg q.h.s.  2. Zyrtec 10 mg daily.  3. Prilosec 20 mg daily.  4. Antivert 12.5 mg p.r.n.  5. Zocor 20 mg q.h.s.  6. Xanax 0.5 mg q.h.s.  7. Glucovance 5/500 mg b.i.d.  8. Lasix 20 mg daily.  9. Levemir 100 units daily.  10.Advair 100/50, 1 puff b.i.d.  11.Albuterol inhaler 1 puff p.r.n.  12.Nasonex 50 mcg once per each nostril daily.  13.Omega-3 fatty acids daily.  14.Relafen 750 mg b.i.d.  15.Vicodin 5/500 mg as previously prescribed.  16.Florinef 0.1 mg b.i.d.  17.Nitroglycerin 0.4 mg sublingual p.r.n. chest pain.   OUTSTANDING LAB STUDIES:  TSH is pending.   DURATION OF DISCHARGE ENCOUNTER:  60 minutes including physician time.      [name], [name]      Meagan Friends. Dietrich Pates, MD, [facility]  Electronically Signed    [name]  D:   07/17/2007  T:  07/18/2007  Job:  [id number]   cc:   [name]

## 2011-02-05 NOTE — Assessment & Plan Note (Signed)
[facility]                         ELECTROPHYSIOLOGY OFFICE NOTE   NAME:ORTEGAJaleya, [name]                       MRN:          [id number]  DATE:01/07/2008                            DOB:          07/14/1966    This is the result of a telephone call at the electrophysiology office  on April 16.   This is a 45 year old female.  She had a history of inappropriate sinus  tachycardia for which she underwent sinus node modification in January  of 2004.  Over the ensuing years, she had worsening bradycardia with  heart rate in the 30s and 40s by monitor and a three-second pause.  She  was seen by Dr. Sharrell Ku, and she underwent implantation of a  pacemaker October 16, 2007.  Over the years for her tachycardia event,  she has been on Pindolol and on discharge on January 23, she went home  on Pindolol 5 mg twice daily.  Her past medical history also includes  neurally mediated syncope for which she is on a _liberal salt diet as  well as Florinef.   This call is made to [name] today because she has called complaining  of several features which I will list now.  1. Tired all the time.  Could this be because her Pindolol has caused      an incessant bradycardia, and the patient is pacing all the time.      She was unable to find her pulse.  I asked her to take her pulse      for 15 seconds just to see, but she was unable to find it.  2. She complains of being sore at the pacemaker site.  It is a little      puffy she says.  It looked bruised when she came out of the shower      this afternoon, a bruised color.  3. She feels at times her heart gives a sharp pain.  She is not sure      whether this is the pacemaker pacing or what.  4. The pharmacy will not fill her prescription for Florinef citing      that she is already on Pindolol, and they say that is probably for      blood pressure decrease, and Florinef is just to the opposite.  5. She complains of  swelling of her hands, unable to get her rings on      bilaterally.   INCOMPLETE      Maple Mirza, PA  Electronically Signed      [name]. [name], MD  Electronically Signed   GM/MedQ  DD: 01/07/2008  DT: 01/07/2008  Job #: [id number]

## 2011-02-05 NOTE — Op Note (Signed)
[name], BROUSE NO.:  [id number]   MEDICAL RECORD NO.:  [id number]          PATIENT TYPE:  OIB   LOCATION:  2807                         FACILITY:  [facility]   PHYSICIAN:  [name]. [name], MD    DATE OF BIRTH:  1965-11-07   DATE OF PROCEDURE:  10/16/2007  DATE OF DISCHARGE:                               OPERATIVE REPORT   PROCEDURE PERFORMED:  Implantation of a dual-chamber pacemaker.   INDICATIONS:  Symptomatic bradycardia.   INTRODUCTION:  The patient is a very pleasant 45 year old woman who has  a history of palpitations and actually a history of sinus tachycardia in  the past.  She is status post sinus node modification approximately 5  years ago.  The patient has developed worsening bradycardia.  She had  her beta blocker discontinued several years ago but presented to the  office yesterday with symptomatic bradycardia with heart rates in the  30s and 40s and pauses of up to 3 seconds.  She would have sudden loss  of sinus beats with junctional escape rhythms, for which she was very  symptomatic.  This is despite being on no AV nodal-blocking drugs or  sinus node-slowing drugs.  She is now referred for dual-chamber  pacemaker insertion.   PROCEDURE:  After informed was obtained, the patient was taken to the  diagnostic EP lab in fasting state.  After the usual preparation and  draping, intravenous fentanyl and midazolam were given for sedation.  lidocaine 30 mL was infiltrated in the left infraclavicular region.  A 5-  cm incision was carried out over this region and electrocautery was  utilized to dissect down to the fascial plane.  The left subclavian vein  was sharply punctured x2 after venography of the subclavian vein  demonstrated that the vein was open.  The [employer]onic model 5076, 52-cm  active-fixation pacing lead was advanced into the right ventricle.  Mapping was carried out and the R waves were found to be 10 mV.  The  pacing impedance was  1000 ohms and the threshold 0.4 V at 0.5 msec.  A  large injury current was present.  With the ventricular lead in  satisfactory position, it was attention was turned to placement atrial  lead, which was placed in the anterolateral portion of the right atrium,  where P waves were initially 1 mV and then increased up to 2 mV and  there was a large injury current with the lead actively fixed.  The  pacing pace impedance was 764 ohms and a threshold of 0.8 volts at 0.5  msec.  Ten-volt pacing in the atrium did not stimulate the diaphragm.  With both the atrial and ventricular leads in satisfactory position,  they were secured to the subpectoralis fascia with a figure-of-eight  silk suture.  The sewing sleeve was also secured with silk suture.  Electrocautery was utilized to make a subcutaneous pocket.  Kanamycin  irrigation was utilized to irrigate the pocket and electrocautery  utilized to assure hemostasis.  The [employer] dual-  chamber pacemaker, serial number [id number], was connected to the atrial  and  ventricular leads and placed back in the subcutaneous pocket.  The  generator was secured with silk suture.  Additional kanamycin was  utilized to irrigate the pocket and the incision closed with a layer of  2-0 Vicryl, followed by a layer of 3-0 Vicryl.  Benzoin was painted on  the skin and Steri-Strips were applied and a pressure dressing was  placed, and the patient was returned to her room in satisfactory  condition.   COMPLICATIONS:  There were no immediate procedure complications.   RESULTS:  This demonstrate successful implantation of a [employer] dual-chamber pacemaker in a patient with symptomatic  bradycardia.      [name]. [name], MD  Electronically Signed     GWT/MEDQ  D:  10/16/2007  T:  10/16/2007  Job:  [id number]

## 2011-02-05 NOTE — Assessment & Plan Note (Signed)
[facility]                         ELECTROPHYSIOLOGY OFFICE NOTE   Meagan, [name]                       MRN:          [id number]  DATE:10/15/2007                            DOB:          24-Aug-1966    [name] returns today for follow-up.  She is a very pleasant 45-year-  old woman with a history of multiple medical problems including diabetes  and neurally-mediated syncope.  The patient also has a history of sinus  node dysfunction and underwent sinus node modification, back  approximately 5 years ago.  Since then, she has been relatively stable,  though she has had documented bradycardia in the past and actually had  to have her pindolol discontinued.  Over the last several days, she has  had worsening spells of shortness of breath and fatigue, and  lightheadedness without frank syncope.  She denies chest pain.  She has  had multiple cardiac evaluations in the past, for her chest pain.  She  has had no evidence of coronary artery disease in the past and was  hospitalized back in October, where a CT the chest was carried out  demonstrating no evidence of pulmonary embolism.  The patient's main  complaint now is that she feels dizzy and lightheaded intermittently and  feels like her heart is beating irregularly.   MEDICATIONS:  Her medications include:  1. Florinef 0.1 mg daily.  2. Tizanidine HCl 4 mg q.h.s.  3. Zyrtec 10 mg q.h.s.  4. Prilosec 20 mg daily.  5. Zocor 20 mg daily.  6. Glucovance 5/500 twice daily.  7. Lasix 20 mg daily.  8. Advair 100/50.  9. Pro-Air 1 puff b.i.d.   PHYSICAL EXAM:  GENERAL:  She is a pleasant, obese 45 year old woman in  no acute distress.  VITAL SIGNS:  Blood pressure was 94/70, the pulse was 54 and irregular,  the respirations were 18, the weight was 251 pounds.  NECK:  Revealed no  jugular distention.  LUNGS:  Clear bilaterally to auscultation.  No wheezes, rales or rhonchi  are present.  CARDIOVASCULAR:  Exam revealed an irregular bradycardia with normal S1  and S2.  The heart sounds were somewhat distant. ABDOMINAL:  Soft and  nontender, nondistended.  There was no organomegaly.  EXTREMITIES:  Demonstrated no cyanosis, clubbing or edema.  Pulses were  2+ and symmetric.  The EKG today is quite interesting in that it  demonstrates sinus bradycardia with a junctional rhythm in the 40s,  alternating with sinus tach at 100 to 105 beats per minute.  There were  pauses up to 3 seconds on the ECG.   IMPRESSION:  1. Symptomatic tachy-brady.  2. History of a history of sinus tachycardia status post sinus node      modification.  3. Diabetes.  4. Obesity.   DISCUSSION:  The etiology behind [name]'s bradycardia is unclear to  me.  It may well be that she is hypokalemic or hypomagnesemic or that  her thyroid function is low.  My plan today is to check electrolytes and  make sure that her thyroids are okay.  If she  does not have any evidence  of either, then a permanent pacemaker insertion would be required, along  with a reinitiation of beta blockers.  I have discussed all the issues  with regard to pacing with the patient, including the risks, benefits,  goals and expectations.  Will plan for pacemaker insertion, if her  electrolytes and thyroid are not abnormal.     [name]. [name], MD  Electronically Signed    GWT/MedQ  DD: 10/15/2007  DT: 10/15/2007  Job #: [id number]   cc:   Lia Hopping

## 2011-02-05 NOTE — Op Note (Signed)
[name], [name]                ACCOUNT NO.:  [id number]   MEDICAL RECORD NO.:  [id number]          PATIENT TYPE:  AMB   LOCATION:  SDS                          FACILITY:  [facility]   PHYSICIAN:  Feliberto Gottron. Turner Daniels, M.D.   DATE OF BIRTH:  [birth year]   DATE OF PROCEDURE:  04/25/2008  DATE OF DISCHARGE:  04/25/2008                               OPERATIVE REPORT   PREOPERATIVE DIAGNOSES:  Bilateral knee chondromalacia patella and  possible loose bodies.   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   PROCEDURE:  Bilateral knee arthroscopies with debridement of  chondromalacia patella, lateral side, grade 3 to grade 4 and removal of  multiple cartilaginous loose bodies.   SURGEON:  Feliberto Gottron.  Turner Daniels, MD   FIRST ASSISTANT:  Shirl Harris PA-C.   ANESTHETIC:  General endotracheal.   ESTIMATED BLOOD LOSS:  Minimal.   FLUID REPLACEMENT:  800 mL of crystalloid.   DRAINS PLACED:  None.   TOURNIQUET TIME:  None.   INDICATIONS FOR PROCEDURE:  This is a 45 year old woman who has had  multiple knee arthroscopies in the past mainly to debride chondromalacia  from the patella and remove the loose bodies which she is prone to  getting.  Her last surgeries were a couple of years ago.  She has  catching, popping, and pain in her knees and has failed conservative  treatment, and desires elective arthroscopic evaluation and treatment of  both knees.  The risks and benefits of surgery discussed, questions were  answered.  The plain radiographs showed little if any arthritic changes.   DESCRIPTION OF PROCEDURE:  The patient identified by armband, taken the  operating room at [facility] where the surgery was done because  of a history of sleep apnea.  Appropriate anesthetic monitors were  attached and general endotracheal anesthesia was induced.  Lateral posts  were applied to both sides of the table and bilateral lower extremities  were  then prepped and draped in usual sterile fashion from the ankle to  the mid thigh.  A time-out procedure was performed and we then started  on the right knee first by making standard inferomedial, inferolateral,  and peripatellar portals.  Diagnostic arthroscopy revealed normal medial  and lateral compartments of the knee, normal cruciate ligaments.  The  lateral facet of the patella did have grade 3 to grade 4 chondromalacia  with SLAP tears.  This was debrided back to a stable margin and multiple  loose bodies were taken through the outflow during the procedure.  The  trochlea was in good condition.  The lateral side of the trochlea had a  little bit of grade 2 chondromalacia and this was lightly debrided.  The  medial facet of the patella was in good condition.  The gutters were  cleared medially and laterally then irrigated out with normal saline  solution and the arthroscopic instruments removed.  We then directed our  attention to the left knee.  Standard inferomedial, inferolateral, and  peripatellar portals were likewise made.  The arthroscope was inserted  through  the inferolateral portal and diagnostic arthroscopy again  revealed normal medial and lateral compartments, cruciate ligaments, and  gutters.  Lateral facet of the patella on the left side, again grade 3  to grade 4 chondromalacia looks a little more extensive than the right  side.  This was debrided back to a stable margin with a 3.5 Gator sucker  shaver and multiple loose bodies were taken through the outflow as well.  After thoroughly flushing out the joint fluid, the arthroscopic  instruments were removed, and a dressing of Xeroform, 4x4 dressing,  sponges, Webril, and Ace wrap were applied to both sides.  The patient  was then awakened and taken to the recovery room without difficulty.      Feliberto Gottron. Turner Daniels, M.D.  Electronically Signed     FJR/MEDQ  D:  04/25/2008  T:  04/26/2008  Job:  [id number]

## 2011-02-08 NOTE — Op Note (Signed)
[facility]  Patient:    [name], [name] Visit Number: [id number] MRN: [id number]          Service Type: END Location: DAY Attending Physician:  Jonathon Bellows Dictated by:   Roetta Sessions, M.D. Proc. Date: 08/17/01 Admit Date:  08/17/2001                             Operative Report  PROCEDURE:  Colonoscopy with biopsy.  INDICATIONS:  The patient is a 45 year old Caucasian female with chronic diarrhea.  Colonoscopy is being done to further evaluate chronic diarrhea. This approach has been discussed with [name] previously in the office. Potential risks, benefits, and alternatives have been explained.  All questions have been answered.  She is agreeable.  Please see my dictated consultation note for more information.  GASTROENTEROLOGIST:  Roetta Sessions, M.D.  PROCEDURE NOTE:  O2 saturation, blood pressure, pulses of this patient were monitored throughout the entire procedure.  Conscious sedation Versed 5 mg IV, Demerol 75 mg IV in divided doses.  INSTRUMENT: Olympus video chip colonoscope.  COLONOSCOPIC FINDINGS:  Digital rectal examination revealed no abnormalities.  ENDOSCOPIC FINDINGS:  Prep was good.  Rectum:  Examination of rectal mucosa including retroflexed view of the anal verge revealed only internal hemorrhoids.  COLON:  Colonic mucosa was surveyed from the rectosigmoid junction through the left transverse right colon to the area of the appendiceal orifice and ileocecal valve and cecum.  These structures were well seen and photographed. The colonic mucosa all the way to the cecum appeared normal.  From the level of the cecum and ileocecal valve, the scope was slowly.   All previously mentioned mucosal surfaces were again seen.   No other abnormalities were observed.  Biopsies of the right sigmoid colon and rectum were taken for histologic study.  Stool residue was aspirated for microbiology studies.  The patient tolerated the  procedure well.  IMPRESSION: 1. Normal rectum. 2. Normal colonic mucosa. 3. Not mentioned above, the terminal ileum was intubated I think at    20 cm, and this segment of the gastrointestinal tract also appeared    normal.  RECOMMENDATIONS: 1. Follow up on stool studies and biopsies. 2. Further recommendations to follow. Dictated by:   Roetta Sessions, M.D. Attending Physician:  Jonathon Bellows DD:  08/17/01 TD:  08/17/01 Job: [id number]

## 2011-02-08 NOTE — H&P (Signed)
NAME:  [name], [name]                         ACCOUNT NO.:  [id number]   MEDICAL RECORD NO.:  [id number]                   PATIENT TYPE:  AMB   LOCATION:  DAY                                  FACILITY:  [facility]   PHYSICIAN:  Dennie Maizes, M.D.                DATE OF BIRTH:  07-24-1966   DATE OF ADMISSION:  02/01/2004  DATE OF DISCHARGE:                                HISTORY & PHYSICAL   CHIEF COMPLAINT:  Intermittent left flank pain and left renal calculus.   HISTORY OF PRESENT ILLNESS:  This 45 year old female has a past history of  recurrent renal lithiasis. She has undergone cystoscopy, right retrograde  pyelogram, urethroscopy, electrohydraulic lithotripsy and extraction of  stone fragments in March 2005 at __________ [facility]. She was noted  to have bilateral renal calculi at that time. She was seen in the office  about 3 weeks ago with severe intermittent left flank pain and left sided  back pain for 2 days. There was no history of fever, chills, voiding  difficulty, or gross hematuria. There was no radiation of the pain to the  front. There was no history of back injury. Her CT scan of the abdomen and  pelvis was done. This revealed a 6 mm size calcified stone in the mid pole  of the left kidney and there was no hydronephrosis or hydroureter. No  ureteral calculi were noted. The patient was brought to the day hospital  today for ESL of the left renal calculus.   PAST MEDICAL HISTORY:  History of type 2 diabetes mellitus, hypertension,  bronchial asthma, elevated cholesterol, status post cholecystectomy, status  post hysterectomy, status post cesarean sections x2, history of recurrent  renal lithiasis, status post urethroscopy stone extraction in March 2005.   MEDICATIONS:  Toprol XL 50 mg 1 p.o. q. daily, Zocor 40 mg 1 p.o. q. daily,  Singulair 10 mg 1 p.o. q. daily, __________ 250/50 1 p.o. q. daily, Xanax  0.5 mg 1 p.o. q.h.s., Lasix 20 mg 1 p.o. q. daily,  Albuterol inhaler 2 puffs  every 4 hours as needed, Glipizide ER 2.5 mg 1 p.o. q. daily, Lisinopril 10  mg 1 p.o. q. daily.   ALLERGIES:  None.   PHYSICAL EXAMINATION:  VITAL SIGNS:  Height 5 feet 7 inches. Weight 246  pounds.  HEENT:  Normal.  NECK:  No masses.  LUNGS:  Clear to auscultation.  HEART:  Regular rate and rhythm. No murmurs.  ABDOMEN:  Soft. No palpable flank mass or CVA tenderness. Bladder not  palpable.   IMPRESSION:  Left flank pain, left renal calculus (6 mm).   PLAN:  ESL of the left renal calculus with IV sedation in the hospital. I  have discussed with the patient regarding the diagnosis, operative details,  alternative treatments, outcome, possible risks and complications and she  has agreed for the procedure to be done.  ___________________________________________                                         Dennie Maizes, M.D.   SK/MEDQ  D:  01/31/2004  T:  01/31/2004  Job:  [id number]

## 2011-02-08 NOTE — Op Note (Signed)
NAME:  [name], [name]                         ACCOUNT NO.:  [id number]   MEDICAL RECORD NO.:  [id number]                   PATIENT TYPE:  OIB   LOCATION:  3728                                 FACILITY:  [facility]   PHYSICIAN:  [name]. [name], M.D.               DATE OF BIRTH:  1966/03/27   DATE OF PROCEDURE:  03/02/2003  DATE OF DISCHARGE:                                 OPERATIVE REPORT   PROCEDURE:  Sinus node modification.   INDICATIONS FOR PROCEDURE:  Inappropriate sinus tachycardia.   I. INTRODUCTION:  The patient is a 45 year old obese woman with a history of  hypertension and tobacco abuse.  She also has diabetes.  She was initially  referred to me with documented sinus tachycardia with rates in the 100's.  When I saw her in the office back several weeks ago the patient's resting  heart rate was in the 110 to 120 range.  Interestingly at times, her heart  rate would transiently slow down into the 70's and then speed gradually back  up.  She is now referred for electrophysiologic study and catheter ablation  either of a high right atrial (incessant) tachycardia or inappropriate sinus  tachycardia.  I have carefully discussed the risks, benefits, goals and  expectations of the procedure.  In addition, I have instructed her that if  she does have inappropriate sinus tachycardia that the likely recurrence  rate despite success ablation is approximately 50.   II. PROCEDURE:  After informed consent was obtained the patient was taken to  the diagnostic EP laboratory in the fasting state.  After the usual  preparation and draping, Fentanyl and midazolam were given for sedation.  A  5 French quadripolar catheter was inserted percutaneously in the right  femoral vein and advanced to the RV apex.  A 5 French quadripolar catheter  was inserted percutaneously in the right femoral vein and advanced to the  His bundle region. A 7 French 20 pole halo catheter was inserted  percutaneously in  the right femoral vein and advanced to the right atrium.  Baseline mapping was carried out demonstrating sinus tachycardia at a rate  of 100 beats per minute.  The atrial activation was high to low in the right  atrium with right atrial activation occurring earlier than the left, then  the low atrial activation.  Rapid ventricular pacing was then carried out  from the RV apex at a pacing cycle length of 600 milliseconds and stepwise  decreased down to 360 milliseconds where VA Wenckebach was observed. During  rapid ventricular pacing the atrial activation sequence was midline and  decremental.  Next, programed ventricular stimulation was carried out from  the RV apex at a pacing cycle length of 500 milliseconds.  The S1-S1  interval was stepwise decreased down to 300 milliseconds where a retrograde  AV node ERP was observed.  During programed ventricular stimulation the  atrial  activation sequence was midline and decremental.  Next rapid atrial  pacing was carried out from the high right atrium at a pacing cycle length  of 500 milliseconds and stepwise decreased down to 260 milliseconds where AV  Wenckebach was observed.  During rapid atrial pacing the PR interval  remained less than the RA interval and there was no inducible AV node  reentry or AV reentry.  Next, programed extrastimulation was carried out  from the high right atrium at a pacing cycle length of 500 milliseconds.  The S1-S2 interval was stepwise decreased down to 300 milliseconds with an  AV node ERP was observed.  During programed atrial stimulation there were no  A-H jumps and no echo beats and no inducible reentrant tachycardia.  At this  point mapping was carried out in the high right atrium demonstrating the  earliest atrial activation to be in a broad band from the lateral upper  right atrium to the septal portion of the upper right atrium.  RF energy  application was selectively applied at the different regions of  earliest  atrial activation in the upper right atrium.  Prior to each RF energy  application, pacing was carried out with the ablation catheter to exclude  diaphragmatic stimulation secondary to phrenic nerve activation.  A total of  18 RF energy applications were delivered to multiple sites in the high right  atrium.  At several locations during RF energy application the sinus node  rate would increase from 95 to 100 beats per minute up to 130 beats per  minute. At other locations the heart rate would abruptly drop into the 60's  during RF energy application.  Following the final RF energy application the  patient was observed for 30 minutes.  Her baseline heart rate which had been  in the 95 to 110 range had now decreased to 75 or 80.  In addition, the  atrial activation sequence was now earlier in the lower portion of the right  atrium and the surface P waves demonstrated a nearly isoelectric P wave  morphology compared to a very upright P wave morphology at the initiation of  the procedure. At this point the catheters were removed, hemostasis assured  and the patient returned to her room in satisfactory condition.   III. COMPLICATIONS:  There were no immediate procedure complications.   IV RESULTS:  A.  Baseline ECG:  The baseline ECG  demonstrates sinus  tachycardia at a rate of 100 beats per minute.  B.  Baseline intervals:  The QRS duration was 100 milliseconds.  The PR  interval was 118 milliseconds.  The H-V interval was 50 milliseconds.  C.  Rapid atrial pacing:  Rapid atrial pacing demonstrated an AV Wenckebach  cycle length of 260 milliseconds.  During rapid atrial pacing there was no  inducible reentrant tachycardia.  D.  Programed atrial stimulation:  Programed atrial stimulation was carried  out from the high right atrium at a baseline pacing cycle length of 500  milliseconds.  The S1-S2 interval was stepwise decreased down to 300 milliseconds where the AV node ERP was  observed.  During programed atrial  stimulation there were no H jumps and no echo beats.  E.  Rapid ventricular pacing:  Rapid ventricular pacing was carried out from  the RV apex at a baseline pacing cycle length of 500 milliseconds and  stepwise decreased down to 360 milliseconds where VA Wenckebach was  observed.  During rapid ventricular pacing, the atrial activation sequence  was midline and decremental.  F. Programed ventricular stimulation:  Programed ventricular stimulation was  carried out from the RV apex at a baseline pacing cycle length of 500  milliseconds.  The S1-S2 interval was stepwise decreased down to 300  milliseconds where the retrograde AV node ERP was observed.  During  programed ventricular stimulation the atrial activation sequence was midline  and decremental and there was no inducible reentrant tachycardia.  G.  Arrhythmias observed; 1)  Inappropriate sinus tachycardia initiation  present at the time of EP study duration sustained.  Termination was  transient with rapid atrial pacing and permanent with RF energy application.  H.  Mapping:  Mapping of the pacing atrial activation demonstrated the  earliest atrial activation in a broad band along the upper lateral and  septal portions of the right atrium.  A. RF energy application:  A total of 18 RF energy applications were     delivered.  During RF energy application the sinus rate would transiently     speed up and then slow down.  At the end of the RF energy applications,     the patient's sinus rate was in the 70's down from a baseline of 95 to     105.   V CONCLUSIONS:  The study demonstrates apparent successful sinus node  modification of a patient with inappropriate sinus tachycardia with a total  of 18 RF energy applications delivered to the region of the sinus node  resulting in slowing of the sinus rate from a baseline of approximately 100  down to 75 or 80.  In addition to the atrial activation  sequence and the  surface P wave morphology was markedly changed indicating a lower intrinsic  sinus pacemaker activity.                                               [name]. [name], M.D.    GWT/MEDQ  D:  03/02/2003  T:  03/02/2003  Job:  [id number]   cc:   Vida Roller, M.D.  Fax: [phone]   Suzanna Obey, M.D.  [address] [place]  [place] [address]  Fax: [phone]

## 2011-02-08 NOTE — Op Note (Signed)
[name], [name]                ACCOUNT NO.:  [id number]   MEDICAL RECORD NO.:  [id number]          PATIENT TYPE:  AMB   LOCATION:  DSC                          FACILITY:  [facility]   PHYSICIAN:  Feliberto Gottron. Turner Daniels, M.D.   DATE OF BIRTH:  March 28, 1966   DATE OF PROCEDURE:  05/29/2005  DATE OF DISCHARGE:                                 OPERATIVE REPORT   PREOPERATIVE DIAGNOSIS:  Chondromalacia left knee.   POSTOPERATIVE DIAGNOSIS:  Chondromalacia patella lateral facette, grade III  of the flap tears.   PROCEDURE:  Arthroscopic debridement chondromalacia patella, grade III flap  tears.   SURGEON:  Feliberto Gottron. Turner Daniels, M.D.   FIRST ASSISTANT:  Erskine Squibb B. Su Hilt, P.A.-C.   ANESTHETIC:  Local with general LMA.   ESTIMATED BLOOD LOSS:  Minimal.   FLUID REPLACEMENT:  500 mL of crystalloid.   DRAINS PLACED:  None.   TOURNIQUET TIME:  None.   INDICATIONS FOR PROCEDURE:  A 45 year old woman with previous chondromalacia  of the left knee and the patellofemoral joint who was has had some recurrent  pain, catching and popping and desires elective arthroscopic evaluation and  treatment of her knee, having failed observation, exercises and anti-  inflammatory medicines.  The risks and benefits of surgery well-known to the  patient and all questions answered.   DESCRIPTION OF PROCEDURE:  The patient identified by armband, taken to the  operating room at [facility]. Appropriate anesthetic  monitors were attached and local anesthesia induced into the right knee  followed by general LMA anesthesia, lateral post applied to the table and  the left lower extremity prepped and draped in the usual sterile fashion  from the ankle to the mid thigh.  Using a #11 blade standard inferomedial  and inferolateral peripatellar portals were then made allowing introduction  of the arthroscope through the inferolateral portal and the outflow through  the inferomedial portal. Diagnostic arthroscopy  revealed chondromalacia with  flap tears to the lateral portion of the lateral facet of the patella and  this was debrided back to a stable margin with a 3.5 gator sucker shaver  using the medial portal. The trochlea was in good condition. The anterior  aspect a lateral femoral condyle had some grade II to III chondromalacia  that was lightly debrided the medial compartment was in excellent condition  as were the cruciate ligaments as well as the lateral compartment. There  were some notch osteophytes but again the cruciates were intact. The gutters  were cleared medially and laterally as were the posterior compartments,  taking the scope medial and lateral to the PCL. At this point, the  arthroscopic instruments were  removed after irrigating out the knee and a dressing of Xeroform, 4x4  dressing sponges, Webril and Ace wrap applied. The patient was awakened,  taken to the recovery room after first instilling 20 mL of 0.5% Marcaine and  epinephrine into the knee joint itself prior to awakening the patient.      Feliberto Gottron. Turner Daniels, M.D.  Electronically Signed     FJR/MEDQ  D:  05/29/2005  T:  05/29/2005  Job:  [id number]

## 2011-02-08 NOTE — Assessment & Plan Note (Signed)
[facility]                         ELECTROPHYSIOLOGY OFFICE NOTE   [name], [name]                       MRN:          [id number]  DATE:12/01/2006                            DOB:          1966/03/28    [name] returns today for followup.  She is a very pleasant obese  middle-aged woman with a history of neurally-mediated syncope,  inappropriate sinus tachycardia, and autonomic dysfunction.  She  returned today having experienced a syncopal episode several weeks ago.  The patient states that she was in her usual state of health and  suddenly got dizzy and lightheaded and passed out.  She has had several  other neural episodes, but she was able to sit down when these occurred.  She denies chest pain.   PHYSICAL EXAMINATION:  GENERAL:  She is a pleasant, obese woman in no  distress.  VITAL SIGNS:  Blood pressure 152/84.  The pulse is 50 and regular.  Respirations are 18.  The weight was 269 pounds.  NECK:  Revealed no jugular venous distention.  LUNGS:  Clear bilaterally to auscultation.  CARDIOVASCULAR:  Revealed a regular rate and rhythm with normal S1 and  S2.  EXTREMITIES:  Demonstrated no cyanosis, clubbing, or edema.   IMPRESSION:  1. Recurrent neurally-mediated syncope.  2. Obesity.   DISCUSSION:  [name] blood pressure is rather borderline but I  think at this point we should initiate a trial of Florinef 0.1 mg twice  daily.  I will see her back in approximately 2 months to adjust her  medications as required.     [name]. [name], MD  Electronically Signed    GWT/MedQ  DD: 12/01/2006  DT: 12/01/2006  Job #: [id number]

## 2011-02-08 NOTE — Op Note (Signed)
NAME:  [name], [name]                         ACCOUNT NO.:  [id number]   MEDICAL RECORD NO.:  [id number]                   PATIENT TYPE:  OUT   LOCATION:  DFTL                                 FACILITY:  [facility]   PHYSICIAN:  Feliberto Gottron. Turner Daniels, M.D.                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   PROCEDURE:  Right knee arthroscopic removal of loose bodies and debridement  of chondromalacia.   SURGEON:  Feliberto Gottron. Turner Daniels, M.D.   FIRST ASSISTANT:  Skip Mayer, P.A.-C.   ANESTHESIA:  General endotracheal.   ESTIMATED BLOOD LOSS:  Minimal.   FLUID REPLACEMENT:  600 mL of crystalloid.   INDICATIONS:  This is a 45 year old woman with symptomatic catching, popping  and pain in her right knee which is felt to be secondary to loose bodies,  one of them seen on a plain x-ray.  She also had an effusion and has failed  conservative treatment, anti-inflammatory medicines, physical therapy, and  observation.   DESCRIPTION OF PROCEDURE:  The patient was identified by arm band and taken  to the operating room at [facility] where the appropriate  anesthetic monitors were attached and general endotracheal anesthesia  induced with the patient in the supine position.  Lateral posts were applied  to the table and the right lower extremity prepped and draped in the usual  sterile fashion from the ankle to the midline using a #11 blade, standard  inferomedial and inferolateral peripatellar portals were then made allowing  introduction of the arthroscope sitting inferolateral portal and the outflow  through the inferomedial portal.  Diagnostic arthroscopy revealed grade IV  chondromalacia  flap tears to the patella with some cartilaginous loose  bodies found floating inside of the knees, and these were debrided with a  3.5 Gator sucker shaver and removed with either the outflow or an  arthroscopic grasper.  The medial meniscus and lateral meniscus were noted  to be intact.  No significant tearing.  There was a little bit of  chondromalacia, and the medial femoral condyle was lightly debrided  incidentally.  The gutters were cleared.  The scope was taken medial and  lateral to the PCL, clearing the posterior compartments as well.  The knee  was then thoroughly irrigated with normal saline solution and the  arthroscopic instruments removed.  A dressing of Xeroform, 4 x 4 dressings,  sponges, Webril and an Ace wrap applied.  The patient was awakened and taken  to the recovery room without difficulty.  Feliberto Gottron. Turner Daniels, M.D.    Ovid Curd  D:  04/24/2004  T:  04/24/2004  Job:  [id number]

## 2011-02-08 NOTE — Op Note (Signed)
[name], [name]                ACCOUNT NO.:  [id number]   MEDICAL RECORD NO.:  [id number]          PATIENT TYPE:  AMB   LOCATION:  DSC                          FACILITY:  [facility]   PHYSICIAN:  Feliberto Gottron. Turner Daniels, M.D.   DATE OF BIRTH:  [birth year]   DATE OF PROCEDURE:  07/12/2005  DATE OF DISCHARGE:                                 OPERATIVE REPORT   PREOPERATIVE DIAGNOSIS:  Right knee chondromalacia of the patella.   POSTOPERATIVE DIAGNOSIS:  Right knee chondromalacia of the patella.   PROCEDURE:  Right knee arthroscopic debridement of chondromalacia of the  patella, focal, grade 4 to the lateral femoral condyle.   SURGEON:  Feliberto Gottron. Turner Daniels, M.D.   FIRST ASSISTANT:  Dwyane Luo, PA student   ANESTHESIA:  Local with IV sedation.   ESTIMATED BLOOD LOSS:  Minimal.   FLUIDS REPLACED:  500 mL crystalloid.   DRAINS:  None.   TOURNIQUET TIME:  None.   INDICATIONS FOR PROCEDURE:  45 year old woman who has had a previous  arthroscopic debridement of her right knee some years ago, had a recurrent  injury about six weeks ago, has failed conservative treatment with  observation, anti-inflammatory medicine, and now desires elective  arthroscopic evaluation and treatment of her right knee for catching,  popping, and pain that are debilitating.  The risks and benefits of the  surgery were discussed with the patient and all questions were answered.   DESCRIPTION OF PROCEDURE:  The patient was identified by arm band and taken  to the operating room at [facility] after the induction of  local anesthesia into the right knee in the block room.  The lateral post  was applied to the table and the right lower extremity was prepped and  draped in the usual sterile fashion from the ankle to the mid thigh.  The  appropriate anesthetic monitors were hooked up prior to the prep and drape.  After the appropriate time out, we went ahead and began the procedure by  making standard inferomedial  and inferolateral peripatellar portals with the  arthroscope introduced through the inferolateral portal and the outflow  through the inferomedial portal.  Diagnostic arthroscopy revealed lateral  facet chondromalacia grade 3 to grade 4 over focal areas, a normal trochlea,  medial and lateral compartments, and the cruciate ligaments were in good  condition.  At this point, we went ahead and loaded up a 3.5 Gator sucker  shaver, swapped portals, and debrided the chondromalacia with flap tears  from the lateral facet of the patella down to stable margins.  Photographic  documentation was made of the procedure and the knee was irrigated out with  normal saline solution.  At this point, the arthroscopic instruments were  removed and a dressing of Xeroform, 4 by 4 dressing sponges, Webril and an  Ace wrap were applied.  The patient was awakened and taken to the recovery  room without difficulty.      Feliberto Gottron. Turner Daniels, M.D.  Electronically Signed     FJR/MEDQ  D:  07/12/2005  T:  07/12/2005  Job:  [id number] 

## 2011-02-08 NOTE — Cardiovascular Report (Signed)
[place]  Patient:    [name], [name]                      MRN: [id number] Proc. Date: 05/08/01 Adm. Date:  [id number] Disc. Date: [id number] Attending:  Glennon Hamilton CC:         Delaney Meigs, M.D.  Thomas C. Wall, M.D. [facility]   Cardiac Catheterization  INDICATIONS:  The patient is a 45 year old obese white female who smokes and has recurrent chest pain with abnormal Cardiolite suggesting anteroseptal ischemia.  PROCEDURES PERFORMED:  Selective coronary angiography and left ventricular angiography - Judkins technique.  RESULTS:  PRESSURES: 1. LV systolic 144, diastolic 16. 2. Aorta systolic 144, diastolic 86.  ANGIOGRAPHY:  There was no calcium noted.  1. The left main coronary artery is normal. 2. The left anterior descending coronary artery is normal. 3. The circumflex coronary artery is normal. 4. The right coronary artery is normal. 5. The left ventricle is normal.  SUMMARY: 1. Normal selective coronary angiography. 2. Normal left ventricular angiography. DD:  05/08/01 TD:  05/08/01 Job: [id number]

## 2011-02-08 NOTE — Op Note (Signed)
[name], [name]                ACCOUNT NO.:  [id number]   MEDICAL RECORD NO.:  [id number]          PATIENT TYPE:  AMB   LOCATION:  SDS                          FACILITY:  [facility]   PHYSICIAN:  Feliberto Gottron. Turner Daniels, M.D.   DATE OF BIRTH:  09-30-1965   DATE OF PROCEDURE:  12/16/2005  DATE OF DISCHARGE:  12/16/2005                                 OPERATIVE REPORT   PREOPERATIVE DIAGNOSIS:  Right knee chondromalacia, possible plica, possible  loose body.   POSTOPERATIVE DIAGNOSIS:  Right knee chondromalacia patella grade 3 to grade  4 with flap tears, symptomatic plica.   PROCEDURE:  Right knee arthroscopic debridement of chondromalacia on that  patella grade 3 to grade 4 with flap tears lateral facet, removal of plica   SURGEON:  Feliberto Gottron. Turner Daniels, M.D.   FIRST ASSISTANT:  Erskine Squibb B. Su Hilt, P.A.-C.   ANESTHETIC:  General LMA.   ESTIMATED BLOOD LOSS:  Minimal.   FLUID REPLACEMENT:  800 ml crystalloid.   DRAINS PLACED:  None.   TOURNIQUET TIME:  None.   INDICATIONS FOR PROCEDURE:  45 year old woman has been a patient of mine for  over 10 years has had previous arthroscopy to both knees. Recently she has  developed catching, popping, and pain in her right knee consistent with  chondromalacia versus articular cartilage loose bodies or flap tears and  possible of plica since it is in the parapatellar region.  Because of the  sudden change in symptoms, she desires elective redo arthroscopic  decompression of the right knee and is well aware of the risks and benefits  of surgery and is prepared for surgical intervention.  All questions  answered.   DESCRIPTION OF PROCEDURE:  The patient identified by armband, taken to the  operating room at [facility] where the case was done because of a  history of sleep apnea. The appropriate anesthetic monitors were attached  and general endotracheal anesthesia induced with the patient in supine  position, lateral post applied to the table.   The right lower extremity  prepped and draped in the usual sterile fashion from the ankle to the mid  thigh. Using a #11 blade, standard inferomedial and inferolateral  peripatellar portals were then made allowing introduction of the arthroscope  through the inferolateral portal and the outflow through the inferomedial  portal. Diagnostic arthroscopy revealed a fairly normal trochlea.  The  lateral facet of the patella had grade 3 to grade 4 chondromalacia with flap  tears and this was debrided back to stable margin using a 3.5 Gator sucker  shaver and swapping portals with the scope coming in medially.  She also had  a fairly large plica getting caught between the patellar and the femur and  this was likewise excised with a 3.5 gator sucker shaver.  We then examined  the medial and lateral compartments and found no significant chondromalacia  and no significant meniscal tears.  The gutters were cleared medially and  laterally and the scope was taken through the notch to clear the posterior  horns, as well.  The knee was then irrigated  out normal saline solution and  the arthroscopic instruments removed and a dressing of Xeroform, 4x4  dressing sponges, Webril and an Ace wrap applied.  The patient was then  awakened and taken to the recovery room without difficulty      Feliberto Gottron. Turner Daniels, M.D.  Electronically Signed     FJR/MEDQ  D:  12/16/2005  T:  12/17/2005  Job:  [id number]

## 2011-02-08 NOTE — Assessment & Plan Note (Signed)
[facility]                              CARDIOLOGY OFFICE NOTE   FAYLYNN, STAMOS                       MRN:          [id number]  DATE:07/02/2006                            DOB:          [birth year]    REASON FOR VISIT:  Recently documented bradycardia.   HISTORY OF PRESENT ILLNESS:  This is my first meeting with Ms. Lysbeth Penner. She  has been seen by several of my partners over the years, including Dr.  [name], Dr. Dorethea Clan, and most recently, Dr. Andee Lineman, during an office visit in  October 2006 in [place]. She has a history of inappropriate sinus tachycardia  and is status post sinus node modification by Dr. [name]. Chart review  suggests that she has had a recurrent feeling of palpitations and has been  managed with beta-blockers over the last few years. She has had prior  ischemic testing, including a cardiac catheterization performed in 2002  which revealed normal coronary arteries and normal left ventricular ejection  fraction. She was last evaluated in October 2006 by Dr. Andee Lineman. At that  time, she had been noted to have bradycardia and junctional rhythm that was  felt to be potentially vaguely mediated in the setting of arthroscopic knee  surgery. She apparently recently underwent a right knee arthroscopy in late  September, by Dr. Turner Daniels and reportedly had a transient episode of  bradycardia with heart rates documented in the anesthesia report down to 19,  requiring treatment with Robinul, with a subsequent increase in heart rate  to the 70s. I am not aware that she had any other marked bradycardia during  this event. 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 Today's repeat tracing shows sinus rhythm at 83 beats-per-minute with  clearly different P-waves than the prior tracing. R-wave progression has  decreased across the precordium and this has been  noted previously. She  denies having any problems with chest pain and has chronic dyspnea on  exertion, typically at NYHA class 2 description. She states that she  experiences palpitations at times, feeling of rapid heart rates sometimes in  the early morning hours and even a feeling of weakness, fatigue and pre-  syncope at times. I get the impression in speaking with her, that she has  had many of these symptoms on a recurrent basis for quite some time. She has  continued on the same dose of Toprol, without any significant recent  adjustments.   ALLERGIES:  FLEXERIL   CURRENT MEDICATIONS:  1. Cyanocobalamin 1000 mg monthly.  2. Glucovance 5/500 mg p.o. b.i.d.  3. Celexa 20 mg p.o. b.i.d.  4. Toprol XL 50 mg p.o. daily.  5. Zyrtec 10 mg p.o. q.h.s.  6. Xanax 0.5 mg p.o. q.h.s. p.r.n.  7. Zocor 20 mg p.o. daily.  8. Lasix 25 mg p.o. daily.  9. Prilosec 20 mg p.o. daily.  10.Ibuprofen 800 mg p.o. t.i.d. p.r.n.   REVIEW OF SYSTEMS:  As described in history of present illness, otherwise  negative.  PHYSICAL EXAMINATION:  VITAL SIGNS: Blood pressure is elevated today at  150/90, heart rate 83, weight 274 pounds.  GENERAL: Patient is obese and in no acute distress.  NECK: Reveals no elevated jugular venous pressure or loud bruits, no  thyromegaly noted.  LUNGS: Clear without labored breathing. Breath sounds are diminished.  CARDIAC: Regular rate and rhythm without S3 gallop or loud murmur.  EXTREMITIES: Trace, chronic appearing edema.   IMPRESSION AND RECOMMENDATIONS:  1. Previously documented history of inappropriate sinus tachycardia,      status post sinus node modification by Dr. [name]. She continues on      beta-blocker therapy with a history of recurrent palpitations. More      recently, she has had documented bradycardia, although in the      perioperative setting. There is also a question as to whether she had      some vaguely mediated bradycardia in the past which is  certainly also      possible. She does have evidence of at least transient ectopic atrial      bradycardia's, and at this point my plan will be to provide an event      recorder to see if we can document objectively any rhythm changes or      pauses that may be associated with any of her symptoms. At this      particular point, I will not modify of her medications until we get      more information. We will have her follow up over the next month to      review her telemetry.  2. Previously documented history of normal coronary arteries at      catheterization with normal ejection fraction.  3. Previously documented history of obstructive sleep apnea. The patient      tells me that she uses CPAP at home and states that she is regular with      this.  4. Type 2 diabetes mellitus and hyperlipidemia, followed by Dr. Olena Leatherwood in      [place].  5. Known history of tobacco use.       Jonelle Sidle, MD     SGM/MedQ  DD:  07/02/2006  DT:  07/04/2006  Job #:  [id number]   cc:   Lia Hopping

## 2011-02-08 NOTE — Assessment & Plan Note (Signed)
[facility]                         ELECTROPHYSIOLOGY OFFICE NOTE   ULANDA, TACKETT                       MRN:          [id number]  DATE:01/28/2007                            DOB:          1966-01-13    [name] returned today for followup.  She is a very pleasant 45-year-  old woman with neurally-mediated syncope and hypertension, and she  returns today for followup.  She has lost approximately 9 pounds.  She  states that she walks on a regular basis now.  She does complain of the  sensation of swelling in her face.  She also some tingling in her left  two fingers.  There is no relationship to exertion with this.  She had  no other specific complaints today.   EXAMINATION:  GENERAL:  She is a pleasant 45 year old woman who is obese  but no distress.  VITAL SIGNS:  Blood pressure today was 132/74, the pulse 64 and regular,  the respirations are 18.  The weight was 258 pounds.  NECK:  Revealed no jugular venous distention.  LUNGS:  Clear bilaterally to auscultation.  There are no wheezes, rales  or rhonchi.  CARDIOVASCULAR:  Revealed a regular rate and rhythm with normal S1 and  S2.  EXTREMITIES:  Demonstrated no cyanosis, clubbing or edema.   MEDICATIONS:  Include:  1. Pindolol 5 mg twice a day.  2. Florinef 0.1 twice a day.  3. Prilosec.  4. Lasix 20 a day.  5. Zocor.  6. Glucovance.  7. Cyanocobalamin.   IMPRESSION:  1. Neurally-mediated syncope.  2. Hypertension.  3. Dyslipidemia.   DISCUSSION:  The patient's tingling in her fingers are unclear to me.  The etiology is obscure.  They did not appear to be bothering her too  badly though.  With regard to her hypertension, she is fairly well  controlled.  I have asked that she continue on her Pindolol.  With  regard to her neurally-mediated syncope, the patient has been stable,  and she has no specific complaints of syncope since we last saw her, and  she will continue on her  Florinef.    [name]. [name], MD  Electronically Signed   GWT/MedQ  DD: 01/28/2007  DT: 01/28/2007  Job #: [id number]   cc:   Lia Hopping

## 2011-02-08 NOTE — Op Note (Signed)
NAMEHULDAH, [name]                ACCOUNT NO.:  [id number]   MEDICAL RECORD NO.:  [id number]          PATIENT TYPE:  AMB   LOCATION:  SDS                          FACILITY:  [facility]   PHYSICIAN:  Feliberto Gottron. Turner Daniels, M.D.   DATE OF BIRTH:  07-29-1966   DATE OF PROCEDURE:  06/20/2006  DATE OF DISCHARGE:  06/20/2006                                 OPERATIVE REPORT   PREOPERATIVE DIAGNOSIS:  Right knee chondromalacia.   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   PROCEDURE:  Right knee arthroscopic debridement of chondromalacia and loose  bodies from the right knee.   SURGEON:  Feliberto Gottron. Turner Daniels, M.D.   FIRST ASSISTANT:  Erskine Squibb B. Jannet Mantis.   ANESTHESIA:  General LMA.   ESTIMATED BLOOD LOSS:  Minimal.   FLUID REPLACEMENT:  700 mL of crystalloid.   DRAINS PLACED:  None.   TOURNIQUET TIME:  None.   INDICATIONS FOR PROCEDURE:  The patient is a 45 year old woman who has had  multiple debridements of her knees in the past, arthroscopic, and has  recurrent catching, popping and pain in her right knee.  She has previously  had some loose bodies in that knee and chondromalacia and because she has  failed conservative treatment, she is taken for arthroscopic evaluation and  treatment of her right knee.  Risks and benefits of surgery discussed,  questions answered.   DESCRIPTION OF PROCEDURE:  The patient identified by armband, taken to the  operating room at [facility] day surgery center.  Appropriate anesthetic monitors  were attached and general LMA anesthesia induced with the patient supine  position, a lateral post applied to the table, right lower extremity prepped  and draped in the usual sterile fashion from the ankle to the midthigh.  Then using a #11 blade, standard inferomedial and inferolateral peripatellar  portals were made allowing introduction of the arthroscope through the  inferolateral portal and the outflow through the inferomedial  portal.  By  the way, this was actually done at [facility] main hospital because of sleep  apnea, and that venue should be changed.  Diagnostic arthroscopy revealed  some mild chondromalacia of the trochlea, some focal grade 3 and focal grade  4 chondromalacia in the lateral facet of the patella, which was down to bare  bone.  There was also a plica excised at the same time.  The articular  cartilages of the medial and lateral compartment were in relatively good  condition.  There was some flap tearing to the patella, also excised at this  point.  Some pea-sized  cartilaginous loose bodies were also removed and the knee was irrigated out  with normal saline solution.  The arthroscopic instruments were removed and  a dressing of Xeroform, 4x4 dressing sponges, Webril and Ace wrap applied.  The patient was then awakened and taken to the recovery room without  difficulty.      Feliberto Gottron. Turner Daniels, M.D.  Electronically Signed     FJR/MEDQ  D:  07/03/2006  T:  07/05/2006  Job:  [id number]

## 2011-02-08 NOTE — Assessment & Plan Note (Signed)
[facility]ALTHC[facility]                            CARDIOLOGY OFFICE NOTE   [name], [name]                       MRN:          [id number]  DATE:09/30/2006                            DOB:          July 04, 1966    PRIMARY CARE PHYSICIAN:  Dr. Lia Hopping.   REASON FOR VISIT:  Routine cardiac followup.   HISTORY OF PRESENT ILLNESS:  I saw [name] back in November.  She has  in the interim followed up with Dr. [name], with history of autonomic  dysfunction including inappropriate sinus tachycardia status post sinus  node modification in 2004 and subsequent bradycardia with documented  pulses as long as 2.5 seconds.  She does have intermittent  symptomatology with these rhythm changes but has had no syncope or  obvious progression.  At this point she is on Pindolol and is being  observed, without clear need for pacemaker as yet.  Symptomatically, she  occasionally has some vague tingling in her left small finger with her  symptoms of dizziness, but no frank exertional chest pain and in fact  has had previously documented normal coronary arteries at cardiac  catheterization in 2002.  She tells me that she may be undergoing  arthroscopic left knee surgery at some point in the near future with Dr.  Gean Birchwood.  She has had problems before with bradycardia around  procedures, presumably vagally mediated.   ALLERGIES:  FLEXERIL.   PRESENT MEDICATIONS:  1. Vitamin B12 1000 mcg monthly.  2. Celexa 20 mg p.o. b.i.d.  3. Zyrtec 10 mg p.o. q.h.s.  4. Xanax 0.5 mg p.o. q.h.s.  5. Zocor 20 mg p.o. q.h.s.  6. Lasix 20 mg p.o. daily.  7. Prilosec 20 mg p.o. daily.  8. Ibuprofen p.r.n.  9. Pindolol 5 mg p.o. b.i.d.   REVIEW OF SYSTEMS:  As in history of present illness.   PHYSICAL EXAMINATION:  Blood pressure is 120/72, heart rate is 60 and  regular, weight is 276 pounds.  The patient is comfortable, in no acute  distress.  NECK:  No elevated jugular venous  pressure.  LUNGS:  Clear, without labored breathing.  CARDIAC:  Reveals a regular rate and rhythm without loud murmur or S3  gallop.  EXTREMITIES:  Show no significant pitting edema, although there is some  trace chronic-appearing edema around the ankles.   LABORATORY DATA:  From December 2007, per Dr. Olena Leatherwood:  Total  cholesterol of 179, triglycerides  237, HDL 44, LDL 88, hemoglobin A1c  8.5%.   IMPRESSION/RECOMMENDATIONS:  1. History of inappropriate sinus tachycardia status post sinus node      modification by Dr. [name] in 2004.  She has had subsequently      documented bradycardia, some of which has been vagally mediated      around the time of procedures, as well as documented pauses of 2.5      seconds.  She has had electrophysiology followup and at this point      pacemaker is not felt to be indicated.  She seems to be tolerating      Pindolol relatively  well.  At this point I would not anticipate any      additional cardiac testing and if she needs to undergo left      arthroscopic knee surgery, I would continue Pindolol without any      other major adjustments.  We will plan to have her follow up over      the next 6 months for symptom review, unless she documents any      progression in the interim.  She has previously documented normal      coronary arteries.  I have recommended that she continue Zocor      however and begin a mega-3 fatty acid supplement at 1000 mg p.o.      b.i.d. given her elevated triglycerides.  Diabetes has been      followed by Dr. Olena Leatherwood.  2. Refills were provided for Zocor, Lasix, and Prilosec.  3. Further plans to follow.     Jonelle Sidle, MD  Electronically Signed    SGM/MedQ  DD: 09/30/2006  DT: 09/30/2006  Job #: [id number]   cc:   Lia Hopping

## 2011-02-08 NOTE — Assessment & Plan Note (Signed)
[name]                           ELECTROPHYSIOLOGY OFFICE NOTE   [name], [name]                       MRN:          [id number]  DATE:08/18/2006                            DOB:          [birth year]    [name] (previously [name]) returns today for followup. I have not seen  [name] for over 3 years. [name] is a pleasant young woman with a history of  inappropriate sinus tachycardia who after a trial of multiple medications  ultimately underwent sinus node modification back in 2004. [name] episodes of  severe tachycardia are improved though [name] does occasionally feel [name] heart  racing with exertion. The patient has had several episodes of bradycardia  and dizziness and diaphoresis associated with near syncope. The patient has  subsequently worn a cardiac monitor which demonstrates positive 2.5 seconds  associated with [name] seconds. [name] states that these spells occur without any  warning and are associated with nausea and diaphoresis. [name] states that [name]  feels like [name] has to sit down when these occur and then they resolve after  several minutes. As noted, the cardiac monitors has demonstrated pauses up  to 2.5 seconds as well as PACs but no frank SVT was noted.   MEDICATIONS:  1. Pindolol 5 mg twice a day.  2. Ibuprofen.  3. Prilosec.  4. Lasix.  5. Zocor.  6. Xanax.  7. Zyrtec.  8. Celexa.  9. Vitamin B12.  10.Glucovance.   PAST MEDICAL HISTORY:  Notable for hypertension, diabetes, dyslipidemia and  obesity.   FAMILY HISTORY:  Noncontributory.   REVIEW OF SYSTEMS:  As noted in the HPI. [name] also complains of headaches  which [name] thinks is worse with [name] beta blocker.   PHYSICAL EXAMINATION:  GENERAL:  [name] is a pleasant, obese, middle-aged woman  in no acute distress.  VITAL SIGNS:  The blood pressure is 132/84, the pulse was 80 and regular,  respirations were 18. Weight was 273.  HEENT:  Normocephalic, atraumatic. Pupils equal and  round. The oropharynx  moist, sclera anicteric.  NECK:  Revealed no jugular venous distention. There was no thyromegaly.  Trachea was midline, the carotids were 2+ and symmetric.  LUNGS:  Clear bilaterally to auscultation. There were no wheezes, rales or  rhonchi.  CARDIOVASCULAR:  Revealed a regular rate and rhythm with normal S1 and S2. I  could not appreciate [name] PMI. There were no obvious murmurs.  ABDOMEN:  Obese, nontender, nondistended. There was no organomegaly. The  bowel sounds were present. There was no rebound or guarding.  EXTREMITIES:  Demonstrated no cyanosis, clubbing or edema. The pulses were  2+ and symmetric.  NEUROLOGIC:  Normal. Cranial nerves are intact. The strength was 5/5 and  symmetric.   EKG demonstrates sinus rhythm.   IMPRESSION:  1. Palpitations.  2. Documented bradycardia.  3. Obesity.  4. Diabetes.  5. Hypertension.   DISCUSSION:  [name] has some evidence of autonomic dysfunction. I have  recommended that [name] continue on [name] Pindolol. [name] have asked that [name] keep  [name] fluid and salt intake up and that [name] sit or  lie down whenever [name] feels  a spell coming on. At the present time, I do not think [name] would be  benefitted by permanent pacemaker implantation but would recommend a period  of watchful waiting. [name] will followup with Dr. Diona [name].     [name]. Meagan Ridgel, MD  Electronically Signed    GWT/MedQ  DD: 08/18/2006  DT: 08/18/2006  Job #: [id number]   cc:   [name]

## 2011-02-08 NOTE — Procedures (Signed)
   NAME:  [name], [name]                         ACCOUNT NO.:  [id number]   MEDICAL RECORD NO.:  [id number]                   PATIENT TYPE:  OUT   LOCATION:  RAD                                  FACILITY:  [facility]   PHYSICIAN:  Vandiver Bing, M.D.               DATE OF BIRTH:  [birth year]   DATE OF PROCEDURE:  02/22/2003                  AGE:  45  DATE OF DISCHARGE:                              SEX:  F                                CARDIAC ULTRASOUND   REFERRING PHYSICIAN:  Dr. [name] and [facility].   CLINICAL INFORMATION:  A 45 year old woman with palpitations and diabetes.   M-MODE:  AORTA:  2.4  (<4.0)  LEFT ATRIUM:  3.3  (<4.0)  SEPTUM:  1.2  (0.7-1.1)  POSTERIOR WALL:  1.1  (0.7-1.1)  LV-DIASTOLE:  3.7  (<5.7)  LV-SYSTOLE:  2.7  (<4.0)   FINDINGS/IMPRESSION:  1. A technically suboptimal but adequate echocardiographic study.  2. Normal left atrium, right atrium, and right ventricle.  3. Normal mitral, aortic and tricuspid valves.  4. Normal internal dimension, wall thickness, regional and global function     of the left ventricle.                                               [place] Bing, M.D.    RR/MEDQ  D:  02/23/2003  T:  02/23/2003  Job:  [id number]

## 2011-02-08 NOTE — Op Note (Signed)
   NAME:  [name], [name]                         ACCOUNT NO.:  [id number]   MEDICAL RECORD NO.:  [id number]                   PATIENT TYPE:  AMB   LOCATION:  DSC                                  FACILITY:  [facility]   PHYSICIAN:  Suzanna Obey, M.D.                    DATE OF BIRTH:  [birth year]   DATE OF PROCEDURE:  DATE OF DISCHARGE:  11/25/2002                                 OPERATIVE REPORT   PREOPERATIVE DIAGNOSES:  Chronic tonsillitis.   POSTOPERATIVE DIAGNOSES:  Chronic tonsillitis.   PROCEDURE:  Tonsillectomy.   ANESTHESIA:  General endotracheal tube.   ESTIMATED BLOOD LOSS:  Less than 5 mL.   INDICATIONS FOR PROCEDURE:  This is a 45 year old whose had chronic  tonsillitis issues. She has repetitive infections and sore throats. She has  had enough problems that she now wants to proceed with tonsillectomy. She  has been informed of the risks and benefits of the procedure including  bleeding, infection, velopharyngeal insufficiency, change in the voice,  chronic pain, and risk of the anesthetic. All questions are answered and  consent was obtained.   DESCRIPTION OF PROCEDURE:  The patient was taken to the operating room,  placed in the supine position and after adequate general endotracheal tube  anesthesia was placed in the Rose position, draped in the usual sterile  manner. The Crowe-Davis mouth gag was inserted, retracted and suspended from  the Mayo stand. The left tonsil begun making a left anterior tonsillar  pillar incision identifying the capsule of the tonsil and removing it with  electrocautery dissection. The right tonsil removed in the same fashion.  Suction cautery was used to obtain hemostasis. The hypopharynx, esophagus  and stomach were suctioned with the NG tube. The Crowe-Davis was released  and resuspended and there was hemostasis present in all locations. The  patient was then removed with a red rubber catheter in the Crowe-Davis. The  patient was awakened  and brought to the recovery room in stable condition.  Counts correct.                                               Suzanna Obey, M.D.    Cordelia Pen  D:  11/25/2002  T:  11/25/2002  Job:  [id number]   cc:   Delaney Meigs, M.D.  [address]  [place]  [place] [address]  Fax: [phone]

## 2011-02-08 NOTE — Op Note (Signed)
[facility]  Patient:    [name], [name] Visit Number: [id number] MRN: [id number]          Service Type: END Location: DAY Attending Physician:  Jonathon Bellows Dictated by:   Roetta Sessions, M.D. Proc. Date: 06/26/01 Admit Date:  06/26/2001   CC:         Delaney Meigs, M.D., [facility], [place], [place]   Operative Report  PROCEDURE:  Diagnostic esophagogastroduodenoscopy.  SURGEON:  Roetta Sessions, M.D.  INDICATION FOR PROCEDURE:  Patient is a 45 year old morbidly obese lady with refractory reflux symptoms.  Symptoms have not improved with Prilosec 20 mg orally daily.  EGD is now being done to further evaluate her symptoms, which has been discussed with patient previously.  Potential risks, benefits and alternatives have been reviewed, questions answered and she is agreeable. Please see my June 16, 2001 dictated consultation note for more information.  PROCEDURE NOTE:  O2 saturation, blood pressure, pulse and respirations were monitored throughout the entire procedure.  CONSCIOUS SEDATION:  Versed 5 mg IV, Demerol 100 mg IV in divided doses.  INSTRUMENT:  Olympus video chip gastroscope.  FINDINGS:  Examination of the tubular esophagus revealed a 1 x 1-cm area of eroded mucosa at the EG junction just on the esophageal side.  There was no evidence of Barretts esophagus, ring, stricture, or neoplasm.  EG junction was easily traversed.  Stomach:  The gastric cavity was empty and insufflated well with air. Thorough examination of the gastric mucosa including a retroflexed view of the proximal stomach and esophagogastric junction demonstrated only a small hiatal hernia.  Pylorus was patent and easily traversed.  Duodenum:  The bulb and second portion appeared normal.  Therapy/diagnostic maneuvers performed:  None.  The patient tolerated the procedure well and was reactive in endoscopy.  IMPRESSION:  Distal esophageal  erosion consistent with erosive reflux esophagitis and small hiatal hernia; remainder of upper gastrointestinal tract appeared normal.  RECOMMENDATIONS: 1. Stop Prilosec.  Begin Aciphex 20 mg orally twice daily 30 minutes before    breakfast and supper. 2. Patient needs to lose a significant amount of weight. 3. Gastroesophageal reflux disease literature provided to [name] today. 4. Followup visit with me in the office in four weeks. Dictated by:   Roetta Sessions, M.D. Attending Physician:  Jonathon Bellows DD:  06/26/01 TD:  06/26/01 Job: [id number]

## 2011-02-08 NOTE — Assessment & Plan Note (Signed)
[name]                              CARDIOLOGY OFFICE NOTE   [name]                       MRN:          [id number]  DATE:07/28/2006                            DOB:          08/20/1966    REFERRING PHYSICIAN:  Annette Stable Hasanaj   REASON FOR VISIT:  Follow-up event recorder.   HISTORY OF PRESENT ILLNESS:  I saw [name] back in mid-October.  She  established care with me at that time, and had a history of inappropriate  sinus tachycardia (status post sinus node modification by Dr. [name] in the  past).  She has had a sense of palpitations as well as brief dizziness; we  provided an event recorder to look into the situation further.  This largely  shows sinus rhythm, with occasional premature ventricular complexes;  although some tracings from late October showed some sinus pauses -- the  longest being approximately 2.7 sec.  She continues to wear the recorder and  call in strips.   I note that these were reviewed by Dr. Graciela Husbands, who recommended a decrease in  her beta blocker therapy and perhaps consideration of other medication  adjustments.  I discussed this with her today, and have recommended changing  from Toprol to Pindolol.  My hope is she will not require a pacemaker long-  term, but this would obviously be a consideration if the situation worsens.  She has not had any frank syncope.   ALLERGIES:  FLEXARIL.   PRESENT MEDICATION:  1. Vitamin B-12 injections monthly.  2. Glucovance 5/500 mg p.o. b.i.d.  3. Celexa 20 mg p.o. b.i.d.  4. Zyrtec 10 mg p.o. q.h.s.  5. Xanax 0.5 mg p.o. q.h.s. p.r.n.  6. Zocor 20 mg p.o. q.d.  7. Lasix 20 mg p.o. q.d.  8. Prilosec 20 mg p.o. q.d.  9. Prilosed 20 p.o. q.d.  10.Toprol XL 25 mg p.o. q.d.   REVIEW OF SYSTEMS:  As described in this present illness.   EXAMINATION:  Blood pressure today 120/84, heart rate 80, weight 273 pounds.  GENERAL:  The patient is in acute distress at this  time.  She did have some  dizziness earlier today.  NECK:  Reveals no abnormal of jugular venous bruits, no thyromegaly is  noted.  Lungs are clear without rapid breathing at rest.  Cardiac exam  reveals a regular RV; with and without murmur.  S3 gallop.  EXTREMITIES:  Show no significant pitting edema.   IMPRESSION AND RECOMMEDATIONS:  1. History of inappropriate sinus tachycardia, status post sinus node      modification by Dr. [name] in the past.  She has documented      bradycardia, as well as some recent pauses noted on event recording      with some symptoms of dizziness.  She has had evidence in the past of      bradycardia that was possibly vagally mediated.  I have asked her to      discontinue Toprol in favor of Pindolol starting at 5 mg p.o. b.i.d.  I  have encouraged her to continue calling and tracings until the      completion of her event recorder over the next week.  I will schedule a      follow-up with Dr. [name] for general follow up of this issue and      otherwise with me over the next 3 months.  My hope is that we can avoid      a pacemaker, although this would certainly be a consideration if things      progress.  2. Previously documented normal coronary arteries at cardiac      catheterization, with normal ejection fraction.     Jonelle Sidle, MD  Electronically Signed    SGM/MedQ  DD: 07/28/2006  DT: 07/28/2006  Job #: [id number]

## 2011-02-08 NOTE — Discharge Summary (Signed)
NAME:  [name], [name]                         ACCOUNT NO.:  [id number]   MEDICAL RECORD NO.:  [id number]                   PATIENT TYPE:  OIB   LOCATION:  3728                                 FACILITY:  [facility]   PHYSICIAN:  [name]. [name], M.D.               DATE OF BIRTH:  1966/06/01   DATE OF ADMISSION:  03/02/2003  DATE OF DISCHARGE:  03/03/2003                                 DISCHARGE SUMMARY   PRIMARY DIAGNOSIS:  Tachycardia.   SECONDARY DIAGNOSIS:  1. Obesity.  2. Hypercholesterolemia.  3. Tobacco abuse.   HISTORY OF PRESENT ILLNESS:  This is a 45 year old female with a history of  dyslipidemia, diabetes.  She has a recent history of recurrent palpitations  with documented sinus tachycardia in the past.  She was initially seen back  in September of 2002 for evaluation of tachycardic palpitations.  She has  since then been on a beta blocker.  Symptoms have increased in frequency and  severity.  In addition, she notes peripheral edema worse in her hands and  her lower extremities.  She has a history of obesity, and her weight has  fluctuated in the past; however, over the last two months, she has lost 10  pounds.  The patient notes tachycardic palpitations at rest with increasing  frequency and severity when she ambulates or exerts herself.  The patient  was admitted and underwent sinus node modification.  She tolerated the  procedure well, had no immediate postoperative complications.  The patient's  beta blockers were discontinued.  Smoke cessation and weight reduction were  discussed with patient as well as caffeine cessation and withdrawal  symptoms.  The patient was discharged in stable condition on the following  medications:  Singulair 5 mg daily, Zocor 80 nightly, Protonix 40 daily,  Neurontin 100 three times a day, Advair 1 puff twice a day, Ativan as  before, to stop the Toprol, Tylenol 1-2 tablets every 4-6 hours as needed.  No heavy lifting or strenuous  activity for four days and no driving for two.  Low-fat, low-salt-low,  low-cholesterol diet.  The patient was given the  name of [facility] Diet, to follow that for weight reduction.  She was  to call if she developed any drainage or lumps in her groin.  She was  instructed on smoke cessation.  She was to follow up with Dr. [name] in the  [place] office in six to eight weeks, and the office will call to  schedule that appointment.     Chinita Pester, C.R.N.P. [facility]                 [name]. [name], M.D.    DS/MEDQ  D:  03/03/2003  T:  03/03/2003  Job:  [id number]   cc:   [name]. [name], M.D.   Suzanna Obey, M.D.  [address] [place]  [place] [address]  Fax: [phone]

## 2011-02-08 NOTE — Op Note (Signed)
NAME:  [name], [name]                         ACCOUNT NO.:  [id number]   MEDICAL RECORD NO.:  [id number]                   PATIENT TYPE:  AMB   LOCATION:  DSC                                  FACILITY:  [facility]   PHYSICIAN:  Feliberto Gottron. Turner Daniels, M.D.                DATE OF BIRTH:  [birth year]   DATE OF PROCEDURE:  DATE OF DISCHARGE:                                 OPERATIVE REPORT   DATE OF SURGERY:  April 11, 2004.   PREOPERATIVE DIAGNOSIS:  Chondromalacia of the left knee.   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   PROCEDURE:  Left knee arthroscopic debridement of chondromalacia.   SURGEON:  Feliberto Gottron. Turner Daniels, MD.   FIRST ASSISTANT:  Skip Mayer, PA-C.   ANESTHETIC:  Local with general LMA.   ESTIMATED BLOOD LOSS:  Minimal.   TOURNIQUET TIME:  None.   INDICATIONS FOR PROCEDURE:  A 45 year old who underwent a successful  arthroscopic decompression of the right knee a few months ago and now has  catching, popping, and pain in the parapatellar region of the left knee.  Because of this and because of the failure of conservative treatment, she is  taken for arthroscopic evaluation and treatment of her left knee. Presumed  diagnosis is chondromalacia of the patella at a minimum.   DESCRIPTION OF PROCEDURE:  The patient identified by arm band and taken to  the operating room at [facility].  Appropriate anesthetic  monitors were attached, and local anesthesia with general LMA anesthesia was  then induced.  A lateral post applied to the table, the left lower extremity  prepped and draped in the usual sterile fashion from the ankle to the mid  thigh.  Using a #11 blade, standard inferomedial and inferolateral  parapatellar portals were made, allowing insertion of the arthroscope into  the inferolateral portal and the outflow through the inferomedial portal.  Diagnostic arthroscopy revealed grade 3 to focal grade 4  chondromalacia of  the patella, which was debrided back to stable margins using a 3.5 gator  sucker shaver.  The medial and lateral compartments were in excellent  condition as were the cruciate ligaments.  The gutters were cleared.  The  scope was taken medial and lateral to the PCL, clearing the posterior  compartments as well.  The only significant findings were the chondromalacia  of the patella primarily under the apex, grade 3 to focal grade 4.  At this  point, the knee was irrigated out with normal saline solution, the  arthroscopic instruments removed, a dressing of Xeroform, 4x4s dressings,  sponges, Webril, and an Ace wrap applied.  The patient did receive 1 gram of  Ancef perioperatively as prophylaxis.  Feliberto Gottron. Turner Daniels, M.D.   Ovid Curd  D:  04/11/2004  T:  04/11/2004  Job:  [id number]

## 2011-02-08 NOTE — Op Note (Signed)
[name], ESCH NO.:  [id number]   MEDICAL RECORD NO.:  [id number]          PATIENT TYPE:  AMB   LOCATION:  DSC                          FACILITY:  [facility]   PHYSICIAN:  Feliberto Gottron. Turner Daniels, M.D.   DATE OF BIRTH:  [birth year]   DATE OF PROCEDURE:  10/10/2004  DATE OF DISCHARGE:                                 OPERATIVE REPORT   PREOPERATIVE DIAGNOSIS:  Chondromalacia, patella.   POSTOPERATIVE DIAGNOSIS:  Chondromalacia, patella, with the addition of a  superolateral osteophyte fracture that was probably the pain generator, as  well as chondromalacia of lateral femoral condyle grade 3.   PROCEDURE:  Left knee arthroscopic removal of chondromalacia from the  patella, lateral femoral condyle, and then the piecemeal removal of the  fractured osteophyte from the superolateral corner of the patella.   SURGEON:  Feliberto Gottron. Turner Daniels, M.D.   FIRST ASSISTANT:  Skip Mayer, P.A.-C.   ANESTHETIC:  Local with  IV sedation.   ESTIMATED BLOOD LOSS:  Minimal.   FLUID REPLACEMENT:  800 cc crystalloid.   DRAINS PLACED:  None.   TOURNIQUET TIME:  None.   INDICATIONS FOR PROCEDURE:  A 45 year old woman who underwent arthroscopic  debridement of chondromalacia from her knee back in July of 2005 and did  well until about a month ago when she had a fall and had recurrent  parapatellar pain. Plain radiographs were unremarkable. She has failed  conservative treatment with anti-inflammatory medicine, rest, and only got  transient relief from I believe a cortisone injection. She actually twisted  her knee on September 30, 2004 which would have been only about 10 days ago. In  any event because of significant pain, she desires elective arthroscopic  evaluation and treatment of her knee, having done well after the arthroscopy  until the twisting injury on January 8.   DESCRIPTION OF PROCEDURE:  The patient identified by armband, taken the  operating room at [facility]. Appropriate anesthetic  monitors were attached and local anesthesia with IV sedation induced into  the left knee. Lateral post applied to the table. Left lower extremity  prepped and draped in usual sterile fashion from the ankle to the midthigh.  Then using a #11 blade, standard inferomedial and inferolateral parapatellar  portals were then made, allowing introduction of the arthroscope through the  inferolateral portal and the outflow through the inferomedial portal. We  medially identified some new chondromalacia with flap tears of the lateral  facette of the patella, and more importantly on probing, it looks like an  osteophyte fractured off of the patella and was rubbing bone-on-bone at the  fracture site. We then performed diagnostic arthroscopy of the medial  compartment, the lateral compartment, as well as the cruciate ligaments and  found those all to be in excellent condition. We directed our attention back  to the patellofemoral compartment and removed the chondromalacia with flap  tears from the patella, lateral femoral condyle, and piecemeal removed the  fractured osteophytes from the lateral aspect of the superolateral aspect of  the patella. We also used a 3.5  hooded vortex bur to remove some of the bone  piecemeal. Once this had been accomplished and there was no longer any  unstable fracture fragments about the patella, the knee was irrigated out  normal saline solution. The arthroscopic instruments were removed and a  dressing of Xeroform 4x4 dressing, sponges, Webril and Ace wrap were  applied. The patient was undraped, unclamped and taken to the recovery room  without difficulty.      [name]  D:  10/10/2004  T:  10/10/2004  Job:  [id number]

## 2011-03-05 ENCOUNTER — Encounter: Payer: Self-pay | Admitting: Cardiology

## 2011-03-22 ENCOUNTER — Ambulatory Visit: Payer: Medicaid Other | Admitting: Cardiology

## 2011-04-23 ENCOUNTER — Encounter: Payer: Medicaid Other | Admitting: Internal Medicine

## 2011-04-26 ENCOUNTER — Encounter: Payer: Self-pay | Admitting: Internal Medicine

## 2011-06-10 ENCOUNTER — Telehealth: Payer: Self-pay | Admitting: *Deleted

## 2011-06-10 NOTE — Telephone Encounter (Signed)
Called pt to inform she needed to schedule a follow up office appointment.  Pt to call back to schedule

## 2011-06-14 LAB — POCT CARDIAC MARKERS: Myoglobin, poc: 39.4

## 2011-06-14 LAB — I-STAT 8, (EC8 V) (CONVERTED LAB)
Acid-base deficit: 3 — ABNORMAL HIGH
Bicarbonate: 20.6
HCT: 45
Operator id: 285491
pCO2, Ven: 31.1 — ABNORMAL LOW

## 2011-06-17 LAB — CBC
HCT: 42.3
MCV: 94.2
RBC: 4.49
WBC: 8.9

## 2011-06-17 LAB — URINALYSIS, ROUTINE W REFLEX MICROSCOPIC
Bilirubin Urine: NEGATIVE
Hgb urine dipstick: NEGATIVE
Protein, ur: NEGATIVE
Urobilinogen, UA: 0.2

## 2011-06-17 LAB — POCT I-STAT, CHEM 8
Creatinine, Ser: 0.8
Glucose, Bld: 135 — ABNORMAL HIGH
Hemoglobin: 15
TCO2: 25

## 2011-06-17 LAB — DIFFERENTIAL
Eosinophils Absolute: 0.2
Lymphocytes Relative: 43
Lymphs Abs: 3.8
Monocytes Relative: 6
Neutrophils Relative %: 48

## 2011-06-21 LAB — COMPREHENSIVE METABOLIC PANEL
ALT: 30
AST: 25
Calcium: 9.3
GFR calc Af Amer: >60
Sodium: 138
Total Protein: 6.5

## 2011-06-21 LAB — CBC
MCHC: 33.4
RDW: 13.1

## 2011-06-28 LAB — HEPATIC FUNCTION PANEL
Albumin: 3.7 g/dL (ref 3.5–5.2)
Bilirubin, Direct: 0.1 mg/dL (ref 0.0–0.3)
Indirect Bilirubin: 0.5 mg/dL (ref 0.3–0.9)
Total Bilirubin: 0.6 mg/dL (ref 0.3–1.2)

## 2011-06-28 LAB — PREGNANCY, URINE: Preg Test, Ur: NEGATIVE

## 2011-06-28 LAB — LIPASE, BLOOD: Lipase: 26 U/L (ref 11–59)

## 2011-06-28 LAB — URINALYSIS, ROUTINE W REFLEX MICROSCOPIC
Bilirubin Urine: NEGATIVE
Nitrite: NEGATIVE
Protein, ur: NEGATIVE mg/dL
Urobilinogen, UA: 0.2 mg/dL (ref 0.0–1.0)

## 2011-06-28 LAB — CBC
MCHC: 33.2 g/dL (ref 30.0–36.0)
MCV: 95.2 fL (ref 78.0–100.0)
RBC: 4.44 MIL/uL (ref 3.87–5.11)

## 2011-06-28 LAB — DIFFERENTIAL
Basophils Relative: 1 % (ref 0–1)
Eosinophils Absolute: 0.2 10*3/uL (ref 0.0–0.7)
Eosinophils Relative: 2 % (ref 0–5)
Monocytes Relative: 6 % (ref 3–12)
Neutrophils Relative %: 58 % (ref 43–77)

## 2011-06-28 LAB — BASIC METABOLIC PANEL
BUN: 11 mg/dL (ref 6–23)
CO2: 21 mEq/L (ref 19–32)
Chloride: 104 mEq/L (ref 96–112)
Creatinine, Ser: 0.69 mg/dL (ref 0.4–1.2)
GFR calc Af Amer: >60 mL/min (ref >60)

## 2011-07-03 LAB — COMPREHENSIVE METABOLIC PANEL
ALT: 41 — ABNORMAL HIGH
AST: 43 — ABNORMAL HIGH
Albumin: 3.7
CO2: 22
Calcium: 9.1
Chloride: 109
GFR calc Af Amer: >60
GFR calc non Af Amer: >60
Sodium: 139
Total Bilirubin: 0.6

## 2011-07-03 LAB — B-NATRIURETIC PEPTIDE (CONVERTED LAB): Pro B Natriuretic peptide (BNP): <30

## 2011-07-03 LAB — LIPID PANEL
Cholesterol: 154
LDL Cholesterol: 91
Total CHOL/HDL Ratio: 4.4

## 2011-07-03 LAB — APTT: aPTT: 33

## 2011-07-03 LAB — CARDIAC PANEL(CRET KIN+CKTOT+MB+TROPI)
Relative Index: INVALID
Relative Index: INVALID
Total CK: 64
Total CK: 84
Troponin I: 0.01

## 2011-07-03 LAB — CBC
Platelets: 233
RBC: 4.3
WBC: 9.2

## 2011-07-03 LAB — PROTIME-INR: INR: 1

## 2011-07-10 LAB — CBC
HCT: 40
Hemoglobin: 13.6
RBC: 4.29
WBC: 8.4

## 2011-07-10 LAB — BASIC METABOLIC PANEL
Calcium: 9
GFR calc Af Amer: >60
GFR calc non Af Amer: >60
Glucose, Bld: 181 — ABNORMAL HIGH
Potassium: 4
Sodium: 139

## 2011-10-07 ENCOUNTER — Encounter: Payer: Self-pay | Admitting: *Deleted

## 2011-11-27 ENCOUNTER — Encounter: Payer: Self-pay | Admitting: Internal Medicine

## 2011-11-27 ENCOUNTER — Ambulatory Visit (INDEPENDENT_AMBULATORY_CARE_PROVIDER_SITE_OTHER): Payer: Medicaid Other | Admitting: Internal Medicine

## 2011-11-27 ENCOUNTER — Telehealth: Payer: Self-pay | Admitting: Internal Medicine

## 2011-11-27 DIAGNOSIS — J449 Chronic obstructive pulmonary disease, unspecified: Secondary | ICD-10-CM

## 2011-11-27 DIAGNOSIS — Z950 Presence of cardiac pacemaker: Secondary | ICD-10-CM

## 2011-11-27 DIAGNOSIS — I498 Other specified cardiac arrhythmias: Secondary | ICD-10-CM

## 2011-11-27 LAB — PACEMAKER DEVICE OBSERVATION
AL IMPEDENCE PM: 340 Ohm
ATRIAL PACING PM: 43
RV LEAD AMPLITUDE: 8.8 mv
RV LEAD IMPEDENCE PM: 470 Ohm
VENTRICULAR PACING PM: 0

## 2011-11-27 NOTE — Assessment & Plan Note (Signed)
Her device is working normally. Will recheck in several months. 

## 2011-11-27 NOTE — Assessment & Plan Note (Signed)
She is still smoking. I have strongly encouraged her to stop. She will continue her current meds.

## 2011-11-27 NOTE — Telephone Encounter (Signed)
Pt has a couple questions regarding her appt today

## 2011-11-27 NOTE — Patient Instructions (Signed)
Your physician wants you to follow-up in: 6 months in the device clinic and 12 months with Dr Taylor You will receive a reminder letter in the mail two months in advance. If you don't receive a letter, please call our office to schedule the follow-up appointment.  

## 2011-11-27 NOTE — Telephone Encounter (Signed)
Spoke with patient and answered her questions.

## 2011-11-27 NOTE — Progress Notes (Signed)
HPI [name] returns today after a long absence from our EP clinic. She is a pleasant 46 yo woman with a h/o symptomatic bradycardia, s/p PPM, HTN, obesity, and syncope. In the interim, she has been stable. She describes a mild stroke over a year ago. She still has spells. Allergies  Allergen Reactions  . Ampicillin     REACTION: itching rash nausea  . Cyclobenzaprine Hcl     REACTION: rash     Current Outpatient Prescriptions  Medication Sig Dispense Refill  . acetaminophen-codeine (TYLENOL #3) 300-30 MG per tablet Take 1 tablet by mouth 2 (two) times daily.      Marland Kitchen albuterol (PROVENTIL) (2.5 MG/3ML) 0.083% nebulizer solution Take 2.5 mg by nebulization every 4 (four) hours as needed.        Marland Kitchen albuterol (VENTOLIN HFA) 108 (90 BASE) MCG/ACT inhaler Inhale 2 puffs into the lungs every 4 (four) hours as needed.        Marland Kitchen aspirin 325 MG EC tablet Take 325 mg by mouth at bedtime.        . benazepril (LOTENSIN) 10 MG tablet Take 10 mg by mouth daily.      . budesonide (ENTOCORT EC) 3 MG 24 hr capsule Take 6 mg by mouth 3 (three) times daily.      . cetirizine (ZYRTEC) 10 MG tablet Take 10 mg by mouth at bedtime.        . diclofenac sodium (VOLTAREN) 1 % GEL Apply topically 4 (four) times daily.      Marland Kitchen exenatide (BYETTA 10 MCG PEN) 10 MCG/0.04ML SOLN Inject 10 mcg into the skin 3 (three) times daily before meals.        . fluticasone-salmeterol (ADVAIR HFA) 115-21 MCG/ACT inhaler Inhale 2 puffs into the lungs 2 (two) times daily.        . hyoscyamine (LEVBID) 0.375 MG 12 hr tablet Take 0.375 mg by mouth 2 (two) times daily as needed.        Marland Kitchen ibuprofen (ADVIL,MOTRIN) 800 MG tablet Take 800 mg by mouth 3 (three) times daily as needed.      . insulin aspart (NOVOLOG) 100 UNIT/ML injection Inject 15 Units into the skin 3 (three) times daily before meals.        . insulin detemir (LEVEMIR) 100 UNIT/ML injection Inject 40 Units into the skin 2 (two) times daily. 40 units with breakfast and at  bedtime       . NON FORMULARY Oxygen - 2L at bedtime       . Omeprazole-Sodium Bicarbonate (ZEGERID) 20-1100 MG CAPS Take 1 capsule by mouth daily before breakfast. 30 min before meals      . pindolol (VISKEN) 10 MG tablet Take 10 mg by mouth 2 (two) times daily.        . potassium chloride SA (K-DUR,KLOR-CON) 20 MEQ tablet Take 20 mEq by mouth daily.      . simvastatin (ZOCOR) 20 MG tablet Take 20 mg by mouth at bedtime.        Marland Kitchen tiotropium (SPIRIVA) 18 MCG inhalation capsule Place 18 mcg into inhaler and inhale daily.      Marland Kitchen tiZANidine (ZANAFLEX) 4 MG tablet Take 12 mg by mouth at bedtime.        . traZODone (DESYREL) 100 MG tablet Take 100 mg by mouth at bedtime.         Past Medical History  Diagnosis Date  . Asthma     COPD  . Diabetes mellitus   .  HTN (hypertension)   . Hypothyroidism   . Kidney stones   . Sleep apnea   . 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 Syncope     neurally mediated   . Other specified cardiac dysrhythmias     ROS:   All systems reviewed and negative except as noted in the HPI.   Past Surgical History  Procedure Date  . Cesarean section     x2  . Cholecystectomy   . Knee arthroscopy   . Phrenic nerve pacemaker implantation     [employer], dual chamber, Dr. [name], 1/09     Family History  Problem Relation Age of Onset  . Colon cancer Neg Hx   . Diabetes Mother   . Diabetes Brother   . Diabetes Sister   . Heart disease Father     deceased. MI. Mother, 2 brothers, sister, nephew also have heart disease  . Emphysema Father     died of it. Was pt of Dr. Sherene Sires      History   Social History  . Marital Status: Single    Spouse Name: N/A    Number of Children: N/A  . Years of Education: N/A   Occupational History  . Not on file.   Social History Main Topics  . Smoking status: Current Everyday Smoker  . Smokeless tobacco: Not on file   Comment: smokes 1/2-1 ppd  since 1974   . Alcohol Use: No  . Drug Use: No  . Sexually Active: Not on file   Other Topics Concern  . Not on file   Social History Narrative   Married, 2 boys. 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      BP 122/66  Pulse 70  Wt 101.969 kg (224 lb 12.8 oz)  Physical Exam:  Well appearing obese, middle age woman, NAD HEENT: Unremarkable Neck:  No JVD, no thyromegally Lungs:  Clear with no wheezes. HEART:  Regular rate rhythm, no murmurs, no rubs, no clicks Abd:  soft, positive bowel sounds, no organomegally, no rebound, no guarding Ext:  2 plus pulses, no edema, no cyanosis, no clubbing Skin:  No rashes no nodules Neuro:  CN II through XII intact, motor grossly intact  DEVICE  Normal device function.  See PaceArt for details.   Assess/Plan:

## 2012-05-27 ENCOUNTER — Encounter: Payer: Self-pay | Admitting: Internal Medicine

## 2012-05-27 ENCOUNTER — Ambulatory Visit (INDEPENDENT_AMBULATORY_CARE_PROVIDER_SITE_OTHER): Payer: Medicaid Other | Admitting: *Deleted

## 2012-05-27 DIAGNOSIS — I498 Other specified cardiac arrhythmias: Secondary | ICD-10-CM

## 2012-05-27 LAB — PACEMAKER DEVICE OBSERVATION
AL AMPLITUDE: 0.6 mv
AL THRESHOLD: 0.6 V
RV LEAD AMPLITUDE: 8.4 mv
RV LEAD THRESHOLD: 0.6 V

## 2012-05-27 MED ORDER — BENAZEPRIL HCL 10 MG PO TABS: 10.0000 mg | ORAL_TABLET | Freq: Every day | ORAL | Status: DC

## 2012-05-27 NOTE — Progress Notes (Signed)
PPM check 

## 2012-06-10 ENCOUNTER — Ambulatory Visit: Payer: Medicaid Other | Admitting: Cardiology

## 2012-06-23 ENCOUNTER — Ambulatory Visit: Payer: Medicaid Other | Admitting: Cardiology

## 2012-06-23 ENCOUNTER — Telehealth: Payer: Self-pay | Admitting: *Deleted

## 2012-08-27 ENCOUNTER — Encounter: Payer: Self-pay | Admitting: Internal Medicine

## 2013-02-26 ENCOUNTER — Encounter: Payer: Medicaid Other | Admitting: Internal Medicine

## 2013-03-25 ENCOUNTER — Encounter: Payer: Medicaid Other | Admitting: Cardiology

## 2013-04-08 ENCOUNTER — Encounter: Payer: Self-pay | Admitting: Internal Medicine

## 2013-04-14 ENCOUNTER — Encounter: Payer: Self-pay | Admitting: *Deleted

## 2013-04-26 ENCOUNTER — Encounter: Payer: Self-pay | Admitting: Internal Medicine

## 2013-04-26 ENCOUNTER — Telehealth: Payer: Self-pay | Admitting: Internal Medicine

## 2013-04-26 NOTE — Telephone Encounter (Signed)
02-26-13 cxl appt/mt 04-08-13 number d/c, sent past due letter/mt 04-26-13 sent certified letter/mt

## 2013-05-03 DIAGNOSIS — R079 Chest pain, unspecified: Secondary | ICD-10-CM

## 2013-05-05 ENCOUNTER — Encounter: Payer: Self-pay | Admitting: Cardiology

## 2013-05-05 ENCOUNTER — Encounter: Payer: Medicaid Other | Admitting: Cardiology

## 2013-05-06 ENCOUNTER — Telehealth: Payer: Self-pay

## 2013-05-06 NOTE — Telephone Encounter (Signed)
Patient called wanting appointment sooner than she was scheduled.  Advised that appointment was earliest available appointment.  Patient stated there was transportation issues and she could not come on 25th.  Offered next avail, which is 29th.  Patient was concerned with potassium being low and having SOB.  She asked about [facility], advised she could call, however they may not be able to see any sooner than the 25th.  She stated she had called but they would not make appt because she already had appt.  I advised it was probably due to that would be the earliest appt available for her.  Patient had NS for appoint scheduled on Wed.    Please call patient regarding medical advise.  She had several concerns about labs, medication and symptoms.  Call [phone].

## 2013-05-06 NOTE — Telephone Encounter (Signed)
Have BMET drawn prior to the scheduled appt.

## 2013-05-06 NOTE — Telephone Encounter (Signed)
Called pt to advise that we placed her on the apt cancellation list in case she can be seen earlier than the 05-21-13 apt with [facility] 11:20am, pt understood, advised if her SXS worsen to please go back to the ER to be evaluated, pt left [facility] this week, pt denies chest pain/still noted SOB on and off, no way to check BP at home, no swelling, please advise if any further assistance can be made per medication management/concerns per pt AMA, also noted pt cancelled 5 apt with our office in the last year

## 2013-05-07 ENCOUNTER — Other Ambulatory Visit: Payer: Self-pay | Admitting: *Deleted

## 2013-05-07 DIAGNOSIS — I10 Essential (primary) hypertension: Secondary | ICD-10-CM

## 2013-05-07 DIAGNOSIS — R079 Chest pain, unspecified: Secondary | ICD-10-CM

## 2013-05-07 DIAGNOSIS — J441 Chronic obstructive pulmonary disease with (acute) exacerbation: Secondary | ICD-10-CM

## 2013-05-07 NOTE — Telephone Encounter (Signed)
Spoke to patient concerning lab/test results/instructions from provider. Patient understood.    

## 2013-05-17 ENCOUNTER — Encounter: Payer: Medicaid Other | Admitting: Adult Health

## 2013-05-21 ENCOUNTER — Encounter: Payer: Self-pay | Admitting: Internal Medicine

## 2013-05-21 ENCOUNTER — Encounter: Payer: Medicaid Other | Admitting: Adult Health

## 2013-05-31 ENCOUNTER — Encounter: Payer: Medicaid Other | Admitting: Adult Health

## 2013-05-31 NOTE — Progress Notes (Signed)
HPI: [name] is a 47 year old patient followed by Dr. Sharrell Ku with known history of symptomatic bradycardia, status post pacemaker. The patient is here post ER visit at [facility] where she was seen on 05/03/2013 for complaints of chest pain. Described as sharp without radiation. She also had complaints of lower extremity pain for about a week, with no history of PE or DVT. Is given nitroglycerin with pain resolution.   In the emergency room the patient's labs were found to be essentially normal with the exception of potassium at 2.9. Cardiac enzymes are found be negative. She  provided with potassium 20 mEq and ASA.. The patient was to be admitted, but the patient refused admission and signed out AMA. Also arrival to the emergency room she was found to be hypertensive with a blood pressure 183/90.  Allergies  Allergen Reactions  . Ampicillin     REACTION: itching rash nausea  . Cyclobenzaprine Hcl     REACTION: rash    Current Outpatient Prescriptions  Medication Sig Dispense Refill  . acetaminophen-codeine (TYLENOL #3) 300-30 MG per tablet Take 1 tablet by mouth 2 (two) times daily.      Marland Kitchen albuterol (PROVENTIL) (2.5 MG/3ML) 0.083% nebulizer solution Take 2.5 mg by nebulization every 4 (four) hours as needed.        Marland Kitchen albuterol (VENTOLIN HFA) 108 (90 BASE) MCG/ACT inhaler Inhale 2 puffs into the lungs every 4 (four) hours as needed.        Marland Kitchen aspirin 325 MG EC tablet Take 325 mg by mouth at bedtime.        . benazepril (LOTENSIN) 10 MG tablet Take 1 tablet (10 mg total) by mouth daily.  30 tablet  6  . budesonide (ENTOCORT EC) 3 MG 24 hr capsule Take 6 mg by mouth 3 (three) times daily.      . cetirizine (ZYRTEC) 10 MG tablet Take 10 mg by mouth at bedtime.        . diclofenac sodium (VOLTAREN) 1 % GEL Apply topically 4 (four) times daily.      Marland Kitchen exenatide (BYETTA 10 MCG PEN) 10 MCG/0.04ML SOLN Inject 10 mcg into the skin 3 (three) times daily before meals.        .  fluticasone-salmeterol (ADVAIR HFA) 115-21 MCG/ACT inhaler Inhale 2 puffs into the lungs 2 (two) times daily.        . hyoscyamine (LEVBID) 0.375 MG 12 hr tablet Take 0.375 mg by mouth 2 (two) times daily as needed.        Marland Kitchen ibuprofen (ADVIL,MOTRIN) 800 MG tablet Take 800 mg by mouth 3 (three) times daily as needed.      . insulin aspart (NOVOLOG) 100 UNIT/ML injection Inject 15 Units into the skin 3 (three) times daily before meals.        . insulin detemir (LEVEMIR) 100 UNIT/ML injection Inject 40 Units into the skin 2 (two) times daily. 40 units with breakfast and at bedtime       . NON FORMULARY Oxygen - 2L at bedtime       . Omeprazole-Sodium Bicarbonate (ZEGERID) 20-1100 MG CAPS Take 1 capsule by mouth daily before breakfast. 30 min before meals      . pindolol (VISKEN) 10 MG tablet Take 10 mg by mouth 2 (two) times daily.        . potassium chloride SA (K-DUR,KLOR-CON) 20 MEQ tablet Take 20 mEq by mouth daily.      . simvastatin (ZOCOR) 20 MG tablet  Take 20 mg by mouth at bedtime.        Marland Kitchen tiotropium (SPIRIVA) 18 MCG inhalation capsule Place 18 mcg into inhaler and inhale daily.      Marland Kitchen tiZANidine (ZANAFLEX) 4 MG tablet Take 12 mg by mouth at bedtime.        . traZODone (DESYREL) 100 MG tablet Take 100 mg by mouth at bedtime.       No current facility-administered medications for this visit.    Past Medical History  Diagnosis Date  . Asthma     COPD  . Diabetes mellitus   . HTN (hypertension)   . Hypothyroidism   . Kidney stones   . Sleep apnea   . 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 Syncope     neurally mediated   . Other specified cardiac dysrhythmias     Past Surgical History  Procedure Laterality Date  . Cesarean section      x2  . Cholecystectomy    . Knee arthroscopy    . Phrenic nerve pacemaker implantation      [employer], dual chamber, Dr. [name], 1/09    ROS: PHYSICAL EXAM There were no  vitals taken for this visit.  EKG:  ASSESSMENT AND PLAN

## 2013-06-15 ENCOUNTER — Encounter: Payer: Self-pay | Admitting: *Deleted

## 2013-07-02 ENCOUNTER — Telehealth: Payer: Self-pay | Admitting: Internal Medicine

## 2013-07-02 NOTE — Telephone Encounter (Signed)
07-02-13 rtn mail for certified letter/mt

## 2014-09-23 DIAGNOSIS — F32A Depression, unspecified: Secondary | ICD-10-CM

## 2014-09-23 HISTORY — DX: Depression, unspecified: F32.A

## 2014-10-31 ENCOUNTER — Telehealth: Payer: Self-pay | Admitting: Family Medicine

## 2014-11-01 NOTE — Telephone Encounter (Signed)
Pt wanted to get an appt for a new pt, she has medicaid, advised she would need to get medicaid to change our name on the card, pt states that would take too long as she needs to be seen now. Pt states she will call someone else and hung up.
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
## 2014-11-13 ENCOUNTER — Emergency Department (HOSPITAL_COMMUNITY)
Admission: EM | Admit: 2014-11-13 | Discharge: 2014-11-14 | Disposition: A | Discharge status: Home / Self Care | Payer: Medicaid Other | Attending: Emergency Medicine | Admitting: Emergency Medicine

## 2014-11-13 ENCOUNTER — Emergency Department (HOSPITAL_COMMUNITY): Payer: Medicaid Other

## 2014-11-13 ENCOUNTER — Encounter (HOSPITAL_COMMUNITY): Payer: Self-pay

## 2014-11-13 DIAGNOSIS — Z79899 Other long term (current) drug therapy: Secondary | ICD-10-CM | POA: Diagnosis not present

## 2014-11-13 DIAGNOSIS — Z9049 Acquired absence of other specified parts of digestive tract: Secondary | ICD-10-CM | POA: Insufficient documentation

## 2014-11-13 DIAGNOSIS — Z7951 Long term (current) use of inhaled steroids: Secondary | ICD-10-CM | POA: Insufficient documentation

## 2014-11-13 DIAGNOSIS — Z87442 Personal history of urinary calculi: Secondary | ICD-10-CM | POA: Diagnosis not present

## 2014-11-13 DIAGNOSIS — R52 Pain, unspecified: Secondary | ICD-10-CM

## 2014-11-13 DIAGNOSIS — Z720 Tobacco use: Secondary | ICD-10-CM | POA: Insufficient documentation

## 2014-11-13 DIAGNOSIS — Z794 Long term (current) use of insulin: Secondary | ICD-10-CM | POA: Insufficient documentation

## 2014-11-13 DIAGNOSIS — Z791 Long term (current) use of non-steroidal anti-inflammatories (NSAID): Secondary | ICD-10-CM | POA: Diagnosis not present

## 2014-11-13 DIAGNOSIS — R1032 Left lower quadrant pain: Secondary | ICD-10-CM | POA: Diagnosis present

## 2014-11-13 DIAGNOSIS — Z8673 Personal history of transient ischemic attack (TIA), and cerebral infarction without residual deficits: Secondary | ICD-10-CM | POA: Insufficient documentation

## 2014-11-13 DIAGNOSIS — Z7902 Long term (current) use of antithrombotics/antiplatelets: Secondary | ICD-10-CM | POA: Diagnosis not present

## 2014-11-13 DIAGNOSIS — N12 Tubulo-interstitial nephritis, not specified as acute or chronic: Secondary | ICD-10-CM

## 2014-11-13 DIAGNOSIS — Z7982 Long term (current) use of aspirin: Secondary | ICD-10-CM | POA: Diagnosis not present

## 2014-11-13 DIAGNOSIS — Z8669 Personal history of other diseases of the nervous system and sense organs: Secondary | ICD-10-CM | POA: Insufficient documentation

## 2014-11-13 DIAGNOSIS — I10 Essential (primary) hypertension: Secondary | ICD-10-CM | POA: Insufficient documentation

## 2014-11-13 DIAGNOSIS — E119 Type 2 diabetes mellitus without complications: Secondary | ICD-10-CM | POA: Diagnosis not present

## 2014-11-13 LAB — URINALYSIS, ROUTINE W REFLEX MICROSCOPIC
Bilirubin Urine: NEGATIVE
GLUCOSE, UA: 500 mg/dL — AB
KETONES UR: NEGATIVE mg/dL
NITRITE: NEGATIVE
SPECIFIC GRAVITY, URINE: 1.01 (ref 1.005–1.030)
UROBILINOGEN UA: 0.2 mg/dL (ref 0.0–1.0)
pH: 6 (ref 5.0–8.0)

## 2014-11-13 LAB — URINE MICROSCOPIC-ADD ON

## 2014-11-13 MED ORDER — HYDROMORPHONE HCL 1 MG/ML IJ SOLN
1.0000 mg | Freq: Once | INTRAMUSCULAR | Status: AC
  Administered 2014-11-13: 1 mg via INTRAVENOUS
  Filled 2014-11-13: qty 1

## 2014-11-13 MED ORDER — SODIUM CHLORIDE 0.9 % IV SOLN
INTRAVENOUS | Status: DC
  Administered 2014-11-13: 23:00:00 via INTRAVENOUS

## 2014-11-13 MED ORDER — ONDANSETRON HCL 4 MG/2ML IJ SOLN
4.0000 mg | Freq: Once | INTRAMUSCULAR | Status: AC
  Administered 2014-11-13: 4 mg via INTRAVENOUS
  Filled 2014-11-13: qty 2

## 2014-11-13 NOTE — ED Notes (Signed)
I think I have kidney stones per pt. Having left flank pain and pain in lower left abdomen per pt. I have been out of my medications for the past 6 months. I refused to go in the hospital the last time I was here.

## 2014-11-13 NOTE — ED Provider Notes (Signed)
CSN: [id number]     Arrival date & time 11/13/14  2119 History  This chart was scribed for Hanley Seamen, MD by Evon Slack, ED Scribe. This patient was seen in room APA18/APA18 and the patient's care was started at 10:53 PM.    Chief Complaint  Patient presents with  . Flank Pain   The history is provided by the patient. No language interpreter was used.   HPI Comments: [name] is a 49 y.o. female with PMHx of kidney stones who presents to the Emergency Department complaining of left sided flank pain onset this morning. Pt states she has associated nausea, vomiting, chills and slight fever. Pt states that the pain radiates down into her LLQ. Pt states that the pain feels like she is being punched in the back. Denies dysuria, hematuria, or difficulty urinating.   Past Medical History  Diagnosis Date  . Asthma     COPD  . Diabetes mellitus   . HTN (hypertension)   . Hypothyroidism   . Kidney stones   . Sleep apnea   . 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 Syncope     neurally mediated   . Other specified cardiac dysrhythmias(427.89)   . Stroke    Past Surgical History  Procedure Laterality Date  . Cesarean section      x2  . Cholecystectomy    . Knee arthroscopy    . Phrenic nerve pacemaker implantation      [employer], dual chamber, Dr. [name], 1/09   Family History  Problem Relation Age of Onset  . Colon cancer Neg Hx   . Diabetes Mother   . Diabetes Brother   . Diabetes Sister   . Heart disease Father     deceased. MI. Mother, 2 brothers, sister, nephew also have heart disease  . Emphysema Father     died of it. Was pt of Dr. Sherene Sires    History  Substance Use Topics  . Smoking status: Current Every Day Smoker  . Smokeless tobacco: Not on file     Comment: smokes 1/2-1 ppd since 1974   . Alcohol Use: No   OB History    No data available     Review of Systems  Constitutional: Positive  for fever and chills.  Gastrointestinal: Positive for nausea, vomiting and abdominal pain.  Genitourinary: Positive for flank pain. Negative for dysuria, hematuria and difficulty urinating.      Allergies  Ampicillin and Cyclobenzaprine hcl  Home Medications   Prior to Admission medications   Medication Sig Start Date End Date Taking? Authorizing Provider  acetaminophen (TYLENOL) 500 MG tablet Take 500 mg by mouth every 6 (six) hours as needed.   Yes Historical Provider, MD  albuterol (PROVENTIL) (2.5 MG/3ML) 0.083% nebulizer solution Take 2.5 mg by nebulization every 4 (four) hours as needed.     Yes Historical Provider, MD  albuterol (VENTOLIN HFA) 108 (90 BASE) MCG/ACT inhaler Inhale 2 puffs into the lungs every 4 (four) hours as needed for wheezing or shortness of breath.    Yes Historical Provider, MD  aspirin 325 MG EC tablet Take 325 mg by mouth daily.    Yes Historical Provider, MD  benazepril (LOTENSIN) 10 MG tablet Take 1 tablet (10 mg total) by mouth daily. 05/27/12  Yes Provider Default, MD  cetirizine (ZYRTEC) 10 MG tablet Take 10 mg by mouth at bedtime.     Yes  Historical Provider, MD  clopidogrel (PLAVIX) 75 MG tablet Take 75 mg by mouth daily.   Yes Historical Provider, MD  diclofenac sodium (VOLTAREN) 1 % GEL Apply topically 4 (four) times daily.   Yes Historical Provider, MD  fluconazole (DIFLUCAN) 150 MG tablet Take 150 mg by mouth every 30 (thirty) days.   Yes Historical Provider, MD  Fluticasone-Salmeterol (ADVAIR) 250-50 MCG/DOSE AEPB Inhale 1 puff into the lungs 2 (two) times daily.   Yes Historical Provider, MD  glyBURIDE-metformin (GLUCOVANCE) 2.5-500 MG per tablet Take 1 tablet by mouth 2 (two) times daily with a meal.   Yes Historical Provider, MD  ibuprofen (ADVIL,MOTRIN) 800 MG tablet Take 800 mg by mouth 3 (three) times daily as needed.   Yes Historical Provider, MD  insulin aspart (NOVOLOG) 100 UNIT/ML injection Inject 15 Units into the skin 3 (three) times  daily before meals.     Yes Historical Provider, MD  insulin detemir (LEVEMIR) 100 UNIT/ML injection Inject 40 Units into the skin 2 (two) times daily. 40 units with breakfast and at bedtime    Yes Historical Provider, MD  loratadine (CLARITIN) 10 MG tablet Take 10 mg by mouth daily.   Yes Historical Provider, MD  omeprazole (PRILOSEC) 20 MG capsule Take 20 mg by mouth daily.   Yes Historical Provider, MD  OXYGEN Inhale 2 L into the lungs at bedtime.   Yes Historical Provider, MD  pindolol (VISKEN) 10 MG tablet Take 10 mg by mouth 2 (two) times daily.     Yes Historical Provider, MD  potassium chloride SA (K-DUR,KLOR-CON) 20 MEQ tablet Take 20 mEq by mouth daily.   Yes Historical Provider, MD  simvastatin (ZOCOR) 20 MG tablet Take 20 mg by mouth at bedtime.     Yes Historical Provider, MD  tiotropium (SPIRIVA) 18 MCG inhalation capsule Place 18 mcg into inhaler and inhale daily.   Yes Historical Provider, MD  tiZANidine (ZANAFLEX) 4 MG tablet Take 12 mg by mouth at bedtime.     Yes Historical Provider, MD  hyoscyamine (LEVBID) 0.375 MG 12 hr tablet Take 0.375 mg by mouth 2 (two) times daily as needed.      Historical Provider, MD   BP 147/86 mmHg  Pulse 97  Temp(Src) 99.8 F (37.7 C) (Oral)  Resp 20  Ht 5\' 7"  (1.702 m)  Wt 200 lb (90.719 kg)  BMI 31.32 kg/m2  SpO2 100%   Physical Exam General: Well-developed, well-nourished female in no acute distress; appearance consistent with age of record HENT: normocephalic; atraumatic Eyes: pupils equal, round and reactive to light; extraocular muscles intact Neck: supple Heart: regular rate and rhythm; no murmurs, rubs or gallops Lungs: clear to auscultation bilaterally Abdomen: soft; nondistended; LLQ tenderness; no masses or hepatosplenomegaly; bowel sounds present GU: left CVA tenderness; urine is yellow and very cloudy Extremities: No deformity; full range of motion; pulses normal Neurologic: Awake, alert and oriented; motor function intact  in all extremities and symmetric; no facial droop Skin: Warm and dry Psychiatric: Normal mood and affect  ED Course  Procedures (including critical care time) DIAGNOSTIC STUDIES: Oxygen Saturation is 100% on RA, normal by my interpretation.    COORDINATION OF CARE: 10:57 PM-Discussed treatment plan with pt at bedside and pt agreed to plan.      MDM   Nursing notes and vitals signs, including pulse oximetry, reviewed.  Summary of this visit's results, reviewed by myself:  Labs:  Results for orders placed or performed during the hospital encounter of 11/13/14 (from  the past 24 hour(s))  Urinalysis, Routine w reflex microscopic     Status: Abnormal   Collection Time: 11/13/14 10:10 PM  Result Value Ref Range   Color, Urine YELLOW YELLOW   APPearance CLOUDY (A) CLEAR   Specific Gravity, Urine 1.010 1.005 - 1.030   pH 6.0 5.0 - 8.0   Glucose, UA 500 (A) NEGATIVE mg/dL   Hgb urine dipstick MODERATE (A) NEGATIVE   Bilirubin Urine NEGATIVE NEGATIVE   Ketones, ur NEGATIVE NEGATIVE mg/dL   Protein, ur TRACE (A) NEGATIVE mg/dL   Urobilinogen, UA 0.2 0.0 - 1.0 mg/dL   Nitrite NEGATIVE NEGATIVE   Leukocytes, UA MODERATE (A) NEGATIVE  Urine microscopic-add on     Status: Abnormal   Collection Time: 11/13/14 10:10 PM  Result Value Ref Range   Squamous Epithelial / LPF RARE RARE   WBC, UA TOO NUMEROUS TO COUNT <3 WBC/hpf   RBC / HPF 3-6 <3 RBC/hpf   Bacteria, UA MANY (A) RARE    Imaging Studies: Ct Renal Stone Study  11/14/2014   CLINICAL DATA:  Left-sided flank pain for 1 day. Sharp, constant, stabbing pain. History of kidney stones. Nausea and vomiting.  EXAM: CT ABDOMEN AND PELVIS WITHOUT CONTRAST  TECHNIQUE: Multidetector CT imaging of the abdomen and pelvis was performed following the standard protocol without IV contrast.  COMPARISON:  None.  FINDINGS: Mild dependent changes in the lung bases.  Kidneys appear symmetrical. No hydronephrosis or hydroureter. No renal, ureteral, or  bladder stones. Bladder wall is not thickened.  The unenhanced appearance of the liver, spleen, pancreas, adrenal glands, abdominal aorta, inferior vena cava, and retroperitoneal lymph nodes is unremarkable. Gallbladder is surgically absent. No bile duct dilatation. Stomach and small bowel are decompressed. Scattered stool in the colon without abnormal distention. No free air or free fluid in the abdomen. Abdominal wall musculature appears intact.  Pelvis: The appendix is normal. Uterus appears to be surgically absent. No free or loculated pelvic fluid collections. No pelvic mass or lymphadenopathy. Mild degenerative changes in the spine. No destructive bone lesions appreciated.  IMPRESSION: No renal or ureteral stone or obstruction. No acute process identified on noncontrast imaging of the abdomen/pelvis.   Electronically Signed   By: Burman Nieves M.D.   On: 11/14/2014 00:36    I personally performed the services described in this documentation, which was scribed in my presence. The recorded information has been reviewed and is accurate.   Hanley Seamen, MD 11/14/14 [phone]

## 2014-11-14 MED ORDER — LEVOFLOXACIN 500 MG PO TABS: 500.0000 mg | ORAL_TABLET | Freq: Every day | ORAL | Status: DC

## 2014-11-14 MED ORDER — OXYCODONE-ACETAMINOPHEN 5-325 MG PO TABS: 1.0000 | ORAL_TABLET | Freq: Four times a day (QID) | ORAL | Status: DC | PRN

## 2014-11-14 MED ORDER — LEVOFLOXACIN 500 MG PO TABS
500.0000 mg | ORAL_TABLET | Freq: Once | ORAL | Status: AC
  Administered 2014-11-14: 500 mg via ORAL
  Filled 2014-11-14: qty 1

## 2014-11-15 MED FILL — Oxycodone w/ Acetaminophen Tab 5-325 MG: ORAL | Qty: 6 | Status: AC

## 2014-11-16 LAB — URINE CULTURE: Colony Count: >=100000

## 2014-11-17 ENCOUNTER — Telehealth (HOSPITAL_COMMUNITY): Payer: Self-pay

## 2014-11-17 NOTE — ED Notes (Signed)
Post ED Visit - Positive Culture Follow-up  Culture report reviewed by antimicrobial stewardship pharmacist:  Wes Dulaney, Pharm.D., BCPS  Celedonio Miyamoto, Pharm.D., BCPS  Georgina Pillion, [address].D., BCPS  [place], [place].D., BCPS, AAHIVP  Estella Husk, Pharm.D., BCPS, AAHIVP  Elder Cyphers, [address].D., BCPS  Positive urine culture Treated with fluconazole, organism sensitive to the same and no further patient follow-up is required at this time.  [name] 11/17/2014, 11:18 AM

## 2014-12-27 ENCOUNTER — Telehealth: Payer: Self-pay | Admitting: Family Medicine

## 2014-12-28 NOTE — Telephone Encounter (Signed)
Patient states that she is not currently on any pain medications at this time. She states that she is currently out of all her medications. Patient is aware that we will not do any chronic pain medications and she verbalizes understanding

## 2015-01-25 ENCOUNTER — Emergency Department (HOSPITAL_COMMUNITY)
Admission: EM | Admit: 2015-01-25 | Discharge: 2015-01-25 | Disposition: A | Discharge status: Home / Self Care | Payer: Medicaid Other | Attending: Emergency Medicine | Admitting: Emergency Medicine

## 2015-01-25 ENCOUNTER — Emergency Department (HOSPITAL_COMMUNITY): Payer: Medicaid Other

## 2015-01-25 ENCOUNTER — Encounter (HOSPITAL_COMMUNITY): Payer: Self-pay | Admitting: Emergency Medicine

## 2015-01-25 DIAGNOSIS — R1110 Vomiting, unspecified: Secondary | ICD-10-CM | POA: Diagnosis present

## 2015-01-25 DIAGNOSIS — I10 Essential (primary) hypertension: Secondary | ICD-10-CM | POA: Diagnosis not present

## 2015-01-25 DIAGNOSIS — J449 Chronic obstructive pulmonary disease, unspecified: Secondary | ICD-10-CM | POA: Insufficient documentation

## 2015-01-25 DIAGNOSIS — E119 Type 2 diabetes mellitus without complications: Secondary | ICD-10-CM | POA: Insufficient documentation

## 2015-01-25 DIAGNOSIS — Z7982 Long term (current) use of aspirin: Secondary | ICD-10-CM | POA: Insufficient documentation

## 2015-01-25 DIAGNOSIS — N201 Calculus of ureter: Secondary | ICD-10-CM | POA: Insufficient documentation

## 2015-01-25 DIAGNOSIS — Z794 Long term (current) use of insulin: Secondary | ICD-10-CM | POA: Diagnosis not present

## 2015-01-25 DIAGNOSIS — Z87442 Personal history of urinary calculi: Secondary | ICD-10-CM | POA: Diagnosis not present

## 2015-01-25 DIAGNOSIS — Z792 Long term (current) use of antibiotics: Secondary | ICD-10-CM | POA: Insufficient documentation

## 2015-01-25 DIAGNOSIS — Z8669 Personal history of other diseases of the nervous system and sense organs: Secondary | ICD-10-CM | POA: Insufficient documentation

## 2015-01-25 DIAGNOSIS — Z720 Tobacco use: Secondary | ICD-10-CM | POA: Diagnosis not present

## 2015-01-25 DIAGNOSIS — Z8673 Personal history of transient ischemic attack (TIA), and cerebral infarction without residual deficits: Secondary | ICD-10-CM | POA: Diagnosis not present

## 2015-01-25 DIAGNOSIS — N12 Tubulo-interstitial nephritis, not specified as acute or chronic: Secondary | ICD-10-CM

## 2015-01-25 DIAGNOSIS — Z79899 Other long term (current) drug therapy: Secondary | ICD-10-CM | POA: Diagnosis not present

## 2015-01-25 DIAGNOSIS — Z7951 Long term (current) use of inhaled steroids: Secondary | ICD-10-CM | POA: Insufficient documentation

## 2015-01-25 DIAGNOSIS — Z7901 Long term (current) use of anticoagulants: Secondary | ICD-10-CM | POA: Insufficient documentation

## 2015-01-25 LAB — URINE MICROSCOPIC-ADD ON

## 2015-01-25 LAB — URINALYSIS, ROUTINE W REFLEX MICROSCOPIC
BILIRUBIN URINE: NEGATIVE
Glucose, UA: 500 mg/dL — AB
Ketones, ur: NEGATIVE mg/dL
Nitrite: POSITIVE — AB
PH: 6 (ref 5.0–8.0)
Protein, ur: 100 mg/dL — AB
Urobilinogen, UA: 0.2 mg/dL (ref 0.0–1.0)

## 2015-01-25 LAB — COMPREHENSIVE METABOLIC PANEL
ALT: 38 U/L (ref 14–54)
AST: 62 U/L — ABNORMAL HIGH (ref 15–41)
Albumin: 3.9 g/dL (ref 3.5–5.0)
Alkaline Phosphatase: 121 U/L (ref 38–126)
Anion gap: 12 (ref 5–15)
BILIRUBIN TOTAL: 0.5 mg/dL (ref 0.3–1.2)
BUN: 14 mg/dL (ref 6–20)
CHLORIDE: 104 mmol/L (ref 101–111)
CO2: 22 mmol/L (ref 22–32)
Calcium: 9.2 mg/dL (ref 8.9–10.3)
Creatinine, Ser: 1.13 mg/dL — ABNORMAL HIGH (ref 0.44–1.00)
GFR calc Af Amer: >60 mL/min (ref >60)
GFR, EST NON AFRICAN AMERICAN: 57 mL/min — AB (ref >60)
Glucose, Bld: 319 mg/dL — ABNORMAL HIGH (ref 70–99)
Potassium: 4 mmol/L (ref 3.5–5.1)
SODIUM: 138 mmol/L (ref 135–145)
Total Protein: 7.6 g/dL (ref 6.5–8.1)

## 2015-01-25 LAB — CBC WITH DIFFERENTIAL/PLATELET
BASOS PCT: 0 % (ref 0–1)
Basophils Absolute: 0.1 10*3/uL (ref 0.0–0.1)
EOS PCT: 0 % (ref 0–5)
Eosinophils Absolute: 0.1 10*3/uL (ref 0.0–0.7)
HEMATOCRIT: 44.9 % (ref 36.0–46.0)
Hemoglobin: 15.1 g/dL — ABNORMAL HIGH (ref 12.0–15.0)
Lymphocytes Relative: 7 % — ABNORMAL LOW (ref 12–46)
Lymphs Abs: 1.1 10*3/uL (ref 0.7–4.0)
MCH: 30.9 pg (ref 26.0–34.0)
MCHC: 33.6 g/dL (ref 30.0–36.0)
MCV: 91.8 fL (ref 78.0–100.0)
MONO ABS: 1.3 10*3/uL — AB (ref 0.1–1.0)
Monocytes Relative: 8 % (ref 3–12)
NEUTROS ABS: 14.1 10*3/uL — AB (ref 1.7–7.7)
Neutrophils Relative %: 85 % — ABNORMAL HIGH (ref 43–77)
Platelets: 145 10*3/uL — ABNORMAL LOW (ref 150–400)
RBC: 4.89 MIL/uL (ref 3.87–5.11)
RDW: 12.9 % (ref 11.5–15.5)
WBC: 16.6 10*3/uL — ABNORMAL HIGH (ref 4.0–10.5)

## 2015-01-25 MED ORDER — HYDROMORPHONE HCL 1 MG/ML IJ SOLN
1.0000 mg | Freq: Once | INTRAMUSCULAR | Status: AC
  Administered 2015-01-25: 1 mg via INTRAVENOUS

## 2015-01-25 MED ORDER — HYDROMORPHONE HCL 1 MG/ML IJ SOLN
INTRAMUSCULAR | Status: AC
  Administered 2015-01-25: 1 mg via INTRAVENOUS
  Filled 2015-01-25: qty 1

## 2015-01-25 MED ORDER — DEXTROSE 5 % IV SOLN
1.0000 g | Freq: Once | INTRAVENOUS | Status: AC
  Administered 2015-01-25: 1 g via INTRAVENOUS
  Filled 2015-01-25: qty 10

## 2015-01-25 MED ORDER — ONDANSETRON 8 MG PO TBDP
8.0000 mg | ORAL_TABLET | Freq: Once | ORAL | Status: AC
  Administered 2015-01-25: 8 mg via ORAL

## 2015-01-25 MED ORDER — HYDROCODONE-ACETAMINOPHEN 5-325 MG PO TABS: 1.0000 | ORAL_TABLET | Freq: Four times a day (QID) | ORAL | Status: DC | PRN

## 2015-01-25 MED ORDER — ONDANSETRON 8 MG PO TBDP
ORAL_TABLET | ORAL | Status: AC
  Filled 2015-01-25: qty 1

## 2015-01-25 MED ORDER — SODIUM CHLORIDE 0.9 % IV BOLUS (SEPSIS)
1000.0000 mL | Freq: Once | INTRAVENOUS | Status: AC
  Administered 2015-01-25: 1000 mL via INTRAVENOUS

## 2015-01-25 MED ORDER — KETOROLAC TROMETHAMINE 30 MG/ML IJ SOLN
15.0000 mg | Freq: Once | INTRAMUSCULAR | Status: AC
  Administered 2015-01-25: 15 mg via INTRAVENOUS
  Filled 2015-01-25: qty 1

## 2015-01-25 MED ORDER — CEPHALEXIN 500 MG PO CAPS: 500.0000 mg | ORAL_CAPSULE | Freq: Two times a day (BID) | ORAL | Status: DC

## 2015-01-25 MED ORDER — HYDROCODONE-ACETAMINOPHEN 5-325 MG PO TABS
1.0000 | ORAL_TABLET | Freq: Once | ORAL | Status: AC
  Administered 2015-01-25: 1 via ORAL
  Filled 2015-01-25: qty 1

## 2015-01-25 MED ORDER — PROMETHAZINE HCL 25 MG PO TABS: 25.0000 mg | ORAL_TABLET | Freq: Four times a day (QID) | ORAL | Status: DC | PRN

## 2015-01-25 MED ORDER — PROMETHAZINE HCL 12.5 MG PO TABS
25.0000 mg | ORAL_TABLET | Freq: Once | ORAL | Status: AC
  Administered 2015-01-25: 25 mg via ORAL
  Filled 2015-01-25: qty 2

## 2015-01-25 NOTE — ED Notes (Signed)
Pt reports emesis,chills since yesterday. Pt denies any fever,diarrhea. nad noted.

## 2015-01-25 NOTE — ED Provider Notes (Signed)
CSN: [id number]     Arrival date & time 01/25/15  1826 History   First MD Initiated Contact with Patient 01/25/15 2003     Chief Complaint  Patient presents with  . Emesis     The history is provided by the patient. No language interpreter was used.   Ms. Weltzin presents for evaluation of left flank pain and vomiting. She reports she has a 2 week history of left leg pain and was told she has a kidney stone but she has not passed it yet. Today she developed vomiting, she has had chills since yesterday. She denies any fevers, diarrhea, abdominal pain, dysuria. She states it hard to urinate at times. She has a history of recurrent kidney stones requiring lithotripsy in the past. Symptoms are moderate, constant, worsening.  Past Medical History  Diagnosis Date  . Asthma     COPD  . Diabetes mellitus   . HTN (hypertension)   . Hypothyroidism   . Kidney stones   . Sleep apnea   . 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 Syncope     neurally mediated   . Other specified cardiac dysrhythmias(427.89)   . Stroke    Past Surgical History  Procedure Laterality Date  . Cesarean section      x2  . Cholecystectomy    . Knee arthroscopy    . Phrenic nerve pacemaker implantation      [employer], dual chamber, Dr. [name], 1/09   Family History  Problem Relation Age of Onset  . Colon cancer Neg Hx   . Diabetes Mother   . Diabetes Brother   . Diabetes Sister   . Heart disease Father     deceased. MI. Mother, 2 brothers, sister, nephew also have heart disease  . Emphysema Father     died of it. Was pt of Dr. Sherene Sires    History  Substance Use Topics  . Smoking status: Current Every Day Smoker  . Smokeless tobacco: Not on file     Comment: smokes 1/2-1 ppd since 1974   . Alcohol Use: No   OB History    No data available     Review of Systems  All other systems reviewed and are negative.     Allergies  Ampicillin and  Cyclobenzaprine hcl  Home Medications   Prior to Admission medications   Medication Sig Start Date End Date Taking? Authorizing Provider  acetaminophen (TYLENOL) 500 MG tablet Take 500 mg by mouth every 6 (six) hours as needed.    Historical Provider, MD  albuterol (PROVENTIL) (2.5 MG/3ML) 0.083% nebulizer solution Take 2.5 mg by nebulization every 4 (four) hours as needed.      Historical Provider, MD  albuterol (VENTOLIN HFA) 108 (90 BASE) MCG/ACT inhaler Inhale 2 puffs into the lungs every 4 (four) hours as needed for wheezing or shortness of breath.     Historical Provider, MD  aspirin 325 MG EC tablet Take 325 mg by mouth daily.     Historical Provider, MD  benazepril (LOTENSIN) 10 MG tablet Take 1 tablet (10 mg total) by mouth daily. 05/27/12   Provider Default, MD  cetirizine (ZYRTEC) 10 MG tablet Take 10 mg by mouth at bedtime.      Historical Provider, MD  clopidogrel (PLAVIX) 75 MG tablet Take 75 mg by mouth daily.    Historical Provider, MD  diclofenac sodium (VOLTAREN) 1 % GEL Apply topically 4 (  four) times daily.    Historical Provider, MD  fluconazole (DIFLUCAN) 150 MG tablet Take 150 mg by mouth every 30 (thirty) days.    Historical Provider, MD  Fluticasone-Salmeterol (ADVAIR) 250-50 MCG/DOSE AEPB Inhale 1 puff into the lungs 2 (two) times daily.    Historical Provider, MD  glyBURIDE-metformin (GLUCOVANCE) 2.5-500 MG per tablet Take 1 tablet by mouth 2 (two) times daily with a meal.    Historical Provider, MD  hyoscyamine (LEVBID) 0.375 MG 12 hr tablet Take 0.375 mg by mouth 2 (two) times daily as needed.      Historical Provider, MD  ibuprofen (ADVIL,MOTRIN) 800 MG tablet Take 800 mg by mouth 3 (three) times daily as needed.    Historical Provider, MD  insulin aspart (NOVOLOG) 100 UNIT/ML injection Inject 15 Units into the skin 3 (three) times daily before meals.      Historical Provider, MD  insulin detemir (LEVEMIR) 100 UNIT/ML injection Inject 40 Units into the skin 2 (two)  times daily. 40 units with breakfast and at bedtime     Historical Provider, MD  levofloxacin (LEVAQUIN) 500 MG tablet Take 1 tablet (500 mg total) by mouth daily. 11/14/14   John Molpus, MD  loratadine (CLARITIN) 10 MG tablet Take 10 mg by mouth daily.    Historical Provider, MD  omeprazole (PRILOSEC) 20 MG capsule Take 20 mg by mouth daily.    Historical Provider, MD  oxyCODONE-acetaminophen (PERCOCET) 5-325 MG per tablet Take 1-2 tablets by mouth every 6 (six) hours as needed (for pain). 11/14/14   Paula Libra, MD  oxyCODONE-acetaminophen (PERCOCET) 5-325 MG per tablet Take 1-2 tablets by mouth every 6 (six) hours as needed (for pain). 11/14/14   Paula Libra, MD  OXYGEN Inhale 2 L into the lungs at bedtime.    Historical Provider, MD  pindolol (VISKEN) 10 MG tablet Take 10 mg by mouth 2 (two) times daily.      Historical Provider, MD  potassium chloride SA (K-DUR,KLOR-CON) 20 MEQ tablet Take 20 mEq by mouth daily.    Historical Provider, MD  simvastatin (ZOCOR) 20 MG tablet Take 20 mg by mouth at bedtime.      Historical Provider, MD  tiotropium (SPIRIVA) 18 MCG inhalation capsule Place 18 mcg into inhaler and inhale daily.    Historical Provider, MD  tiZANidine (ZANAFLEX) 4 MG tablet Take 12 mg by mouth at bedtime.      Historical Provider, MD   BP 158/88 mmHg  Pulse 97  Temp(Src) 99.6 F (37.6 C) (Oral)  Ht 5\' 7"  (1.702 m)  Wt 198 lb (89.812 kg)  BMI 31.00 kg/m2  SpO2 95% Physical Exam  Constitutional: She is oriented to person, place, and time. She appears well-developed and well-nourished.  HENT:  Head: Normocephalic and atraumatic.  Cardiovascular: Regular rhythm.   Tachycardic  Pulmonary/Chest: Effort normal. No respiratory distress.  Abdominal: Soft. There is no tenderness. There is no rebound and no guarding.  Musculoskeletal: She exhibits no edema or tenderness.  Neurological: She is alert and oriented to person, place, and time.  Skin: Skin is warm and dry.  Psychiatric: She  has a normal mood and affect. Her behavior is normal.  Nursing note and vitals reviewed.   ED Course  Procedures (including critical care time) Labs Review Labs Reviewed  CBC WITH DIFFERENTIAL/PLATELET - Abnormal; Notable for the following:    WBC 16.6 (*)    Hemoglobin 15.1 (*)    Platelets 145 (*)    Neutrophils Relative % 85 (*)  Neutro Abs 14.1 (*)    Lymphocytes Relative 7 (*)    Monocytes Absolute 1.3 (*)    All other components within normal limits  COMPREHENSIVE METABOLIC PANEL - Abnormal; Notable for the following:    Glucose, Bld 319 (*)    Creatinine, Ser 1.13 (*)    AST 62 (*)    GFR calc non Af Amer 57 (*)    All other components within normal limits  URINALYSIS, ROUTINE W REFLEX MICROSCOPIC - Abnormal; Notable for the following:    APPearance CLOUDY (*)    Specific Gravity, Urine >1.030 (*)    Glucose, UA 500 (*)    Hgb urine dipstick LARGE (*)    Protein, ur 100 (*)    Nitrite POSITIVE (*)    Leukocytes, UA MODERATE (*)    All other components within normal limits  URINE MICROSCOPIC-ADD ON - Abnormal; Notable for the following:    Squamous Epithelial / LPF FEW (*)    Bacteria, UA MANY (*)    All other components within normal limits  URINE CULTURE    Imaging Review Dg Abd 1 View  01/25/2015   CLINICAL DATA:  Left-sided flank pain  EXAM: ABDOMEN - 1 VIEW  COMPARISON:  None.  FINDINGS: Scattered large and small bowel gas is noted. No abnormal mass or abnormal calcifications are noted. Changes consistent with prior cholecystectomy are seen. No acute bony abnormality is noted.  IMPRESSION: No acute abnormality seen.   Electronically Signed   By: Alcide Clever M.D.   On: 01/25/2015 20:59   Ct Renal Stone Study  01/25/2015   CLINICAL DATA:  Chills, vomiting and gross hematuria beginning 01/24/2015 P  EXAM: CT ABDOMEN AND PELVIS WITHOUT CONTRAST  TECHNIQUE: Multidetector CT imaging of the abdomen and pelvis was performed following the standard protocol without IV  contrast.  COMPARISON:  CT abdomen and pelvis 11/14/2014.  FINDINGS: The lung bases are clear. No pleural or pericardial effusion. Heart size is normal.  There is no hydronephrosis on the right or left and no renal or ureteral stones are seen. There is stranding about both kidneys which appears increased compared to the prior examination. The urinary bladder is unremarkable. The patient is status post hysterectomy.  The gallbladder has been removed. There is diffuse fatty infiltration of the liver without focal lesion identified. The adrenal glands, spleen, pancreas and biliary tree appear normal. The stomach, small and large bowel and appendix appear normal. There is no lymphadenopathy or fluid collection. No lytic or sclerotic bony lesion is identified.  IMPRESSION: Negative for urinary tract stone or hydronephrosis.  Stranding about both kidneys is worse on the left. While nonspecific, this could be due to pyelonephritis.  Fatty infiltration of the liver.  Status post cholecystectomy and hysterectomy.   Electronically Signed   By: Drusilla Kanner M.D.   On: 01/25/2015 21:38     EKG Interpretation None      MDM   Final diagnoses:  Pyelonephritis    Patient with history of renal colic here for evaluation of flank pain, chills, vomiting. UA consistent with UTI. CT stone study obtained to evaluate for complaining features such as stone given patient's history of renal colic. CT negative for shortening stone, scan does demonstrate pyelonephritis. Patient feeling improved on repeat evaluation tolerating oral fluids and wishes to go home. Discussed with patient dehydration, recommend admission for IV antibiotics and fluids. Patient still prefers to go home, discussed with patient home care as well as very close return precautions.  Tilden Fossa, MD 01/25/15 2225

## 2015-01-25 NOTE — ED Notes (Signed)
In process of discharging patient, noted Rocephin bag had been clamped at some point and infusion had not completed.  Restarted infusion, prior to completing d/c.

## 2015-01-25 NOTE — Discharge Instructions (Signed)
Pyelonephritis, Adult °Pyelonephritis is a kidney infection. In general, there are 2 main types of pyelonephritis: °· Infections that come on quickly without any warning (acute pyelonephritis). °· Infections that persist for a long period of time (chronic pyelonephritis). °CAUSES  °Two main causes of pyelonephritis are: °· Bacteria traveling from the bladder to the kidney. This is a problem especially in pregnant women. The urine in the bladder can become filled with bacteria from multiple causes, including: °¨ Inflammation of the prostate gland (prostatitis). °¨ Sexual intercourse in females. °¨ Bladder infection (cystitis). °· Bacteria traveling from the bloodstream to the tissue part of the kidney. °Problems that may increase your risk of getting a kidney infection include: °· Diabetes. °· Kidney stones or bladder stones. °· Cancer. °· Catheters placed in the bladder. °· Other abnormalities of the kidney or ureter. °SYMPTOMS  °· Abdominal pain. °· Pain in the side or flank area. °· Fever. °· Chills. °· Upset stomach. °· Blood in the urine (dark urine). °· Frequent urination. °· Strong or persistent urge to urinate. °· Burning or stinging when urinating. °DIAGNOSIS  °Your caregiver may diagnose your kidney infection based on your symptoms. A urine sample may also be taken. °TREATMENT  °In general, treatment depends on how severe the infection is.  °· If the infection is mild and caught early, your caregiver may treat you with oral antibiotics and send you home. °· If the infection is more severe, the bacteria may have gotten into the bloodstream. This will require intravenous (IV) antibiotics and a hospital stay. Symptoms may include: °¨ High fever. °¨ Severe flank pain. °¨ Shaking chills. °· Even after a hospital stay, your caregiver may require you to be on oral antibiotics for a period of time. °· Other treatments may be required depending upon the cause of the infection. °HOME CARE INSTRUCTIONS  °· Take your  antibiotics as directed. Finish them even if you start to feel better. °· Make an appointment to have your urine checked to make sure the infection is gone. °· Drink enough fluids to keep your urine clear or pale yellow. °· Take medicines for the bladder if you have urgency and frequency of urination as directed by your caregiver. °SEEK IMMEDIATE MEDICAL CARE IF:  °· You have a fever or persistent symptoms for more than 2-3 days. °· You have a fever and your symptoms suddenly get worse. °· You are unable to take your antibiotics or fluids. °· You develop shaking chills. °· You experience extreme weakness or fainting. °· There is no improvement after 2 days of treatment. °MAKE SURE YOU: °· Understand these instructions. °· Will watch your condition. °· Will get help right away if you are not doing well or get worse. °Document Released: 09/09/2005 Document Revised: 03/10/2012 Document Reviewed: 02/13/2011 °[employer]® Patient Information ©2015 [employer]. This information is not intended to replace advice given to you by your health care provider. Make sure you discuss any questions you have with your health care provider. ° °

## 2015-01-29 LAB — URINE CULTURE

## 2015-01-30 ENCOUNTER — Encounter: Payer: Self-pay | Admitting: Family Medicine

## 2015-01-30 ENCOUNTER — Telehealth (HOSPITAL_COMMUNITY): Payer: Self-pay

## 2015-01-30 ENCOUNTER — Ambulatory Visit (INDEPENDENT_AMBULATORY_CARE_PROVIDER_SITE_OTHER): Payer: Medicaid Other | Admitting: Family Medicine

## 2015-01-30 VITALS — BP 192/115 | HR 85 | Temp 98.3°F | Ht 64.0 in | Wt 204.6 lb

## 2015-01-30 DIAGNOSIS — E782 Mixed hyperlipidemia: Secondary | ICD-10-CM | POA: Diagnosis not present

## 2015-01-30 DIAGNOSIS — N10 Acute tubulo-interstitial nephritis: Secondary | ICD-10-CM | POA: Diagnosis not present

## 2015-01-30 DIAGNOSIS — R5383 Other fatigue: Secondary | ICD-10-CM

## 2015-01-30 DIAGNOSIS — E1165 Type 2 diabetes mellitus with hyperglycemia: Secondary | ICD-10-CM | POA: Diagnosis not present

## 2015-01-30 DIAGNOSIS — I10 Essential (primary) hypertension: Secondary | ICD-10-CM

## 2015-01-30 DIAGNOSIS — J449 Chronic obstructive pulmonary disease, unspecified: Secondary | ICD-10-CM

## 2015-01-30 DIAGNOSIS — IMO0002 Reserved for concepts with insufficient information to code with codable children: Secondary | ICD-10-CM

## 2015-01-30 LAB — POCT GLYCOSYLATED HEMOGLOBIN (HGB A1C): HEMOGLOBIN A1C: 12.8

## 2015-01-30 MED ORDER — TIOTROPIUM BROMIDE MONOHYDRATE 18 MCG IN CAPS: 18.0000 ug | ORAL_CAPSULE | Freq: Every day | RESPIRATORY_TRACT | Status: DC

## 2015-01-30 MED ORDER — BLOOD GLUCOSE MONITOR KIT: PACK | Status: DC

## 2015-01-30 MED ORDER — DICLOFENAC SODIUM 1 % TD GEL: 4.0000 g | Freq: Four times a day (QID) | TRANSDERMAL | Status: DC

## 2015-01-30 MED ORDER — LEVOFLOXACIN 500 MG PO TABS: 500.0000 mg | ORAL_TABLET | Freq: Every day | ORAL | Status: DC

## 2015-01-30 MED ORDER — ALBUTEROL SULFATE (2.5 MG/3ML) 0.083% IN NEBU: 2.5000 mg | INHALATION_SOLUTION | Freq: Four times a day (QID) | RESPIRATORY_TRACT | Status: DC | PRN

## 2015-01-30 MED ORDER — CLOPIDOGREL BISULFATE 75 MG PO TABS: 75.0000 mg | ORAL_TABLET | Freq: Every day | ORAL | Status: DC

## 2015-01-30 MED ORDER — FLUTICASONE-SALMETEROL 115-21 MCG/ACT IN AERO: 2.0000 | INHALATION_SPRAY | Freq: Two times a day (BID) | RESPIRATORY_TRACT | Status: DC

## 2015-01-30 MED ORDER — TIZANIDINE HCL 4 MG PO TABS: 12.0000 mg | ORAL_TABLET | Freq: Every day | ORAL | Status: DC

## 2015-01-30 MED ORDER — INSULIN ASPART 100 UNIT/ML ~~LOC~~ SOLN: 15.0000 [IU] | Freq: Three times a day (TID) | SUBCUTANEOUS | Status: DC

## 2015-01-30 MED ORDER — PINDOLOL 10 MG PO TABS: 10.0000 mg | ORAL_TABLET | Freq: Every day | ORAL | Status: DC

## 2015-01-30 MED ORDER — INSULIN DETEMIR 100 UNIT/ML ~~LOC~~ SOLN: 80.0000 [IU] | Freq: Every day | SUBCUTANEOUS | Status: DC

## 2015-01-30 MED ORDER — PANTOPRAZOLE SODIUM 40 MG PO TBEC: 40.0000 mg | DELAYED_RELEASE_TABLET | Freq: Every day | ORAL | Status: DC
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
## 2015-01-30 NOTE — Telephone Encounter (Signed)
Post ED Visit - Positive Culture Follow-up  Culture report reviewed by antimicrobial stewardship pharmacist: []  Wes Dulaney, Pharm.D., BCPS []  Celedonio Miyamoto, Pharm.D., BCPS []  Georgina Pillion, Pharm.D., BCPS []  [place], [address].D., BCPS, AAHIVP []  Estella Husk, Pharm.D., BCPS, AAHIVP []  Elder Cyphers, [address].D., BCPS X  Tegan Magsam, Pharm D  Positive Urine culture, >/= 100,000 colonies -> E Coli Treated with Keflex, organism sensitive to the same and no further patient follow-up is required at this time.   Arvid Right 01/30/2015, 7:59 PM

## 2015-01-31 ENCOUNTER — Telehealth: Payer: Self-pay

## 2015-01-31 ENCOUNTER — Other Ambulatory Visit: Payer: Self-pay | Admitting: Family Medicine

## 2015-01-31 LAB — CMP14+EGFR
A/G RATIO: 1.8 (ref 1.1–2.5)
ALK PHOS: 121 IU/L — AB (ref 39–117)
ALT: 19 IU/L (ref 0–32)
AST: 16 IU/L (ref 0–40)
Albumin: 4.2 g/dL (ref 3.5–5.5)
BUN / CREAT RATIO: 14 (ref 9–23)
BUN: 10 mg/dL (ref 6–24)
CHLORIDE: 103 mmol/L (ref 97–108)
CO2: 22 mmol/L (ref 18–29)
Calcium: 9.3 mg/dL (ref 8.7–10.2)
Creatinine, Ser: 0.73 mg/dL (ref 0.57–1.00)
GFR calc non Af Amer: 98 mL/min/{1.73_m2} (ref >59)
GFR, EST AFRICAN AMERICAN: 113 mL/min/{1.73_m2} (ref >59)
Globulin, Total: 2.4 g/dL (ref 1.5–4.5)
Glucose: 372 mg/dL — ABNORMAL HIGH (ref 65–99)
POTASSIUM: 4.5 mmol/L (ref 3.5–5.2)
SODIUM: 141 mmol/L (ref 134–144)
Total Protein: 6.6 g/dL (ref 6.0–8.5)

## 2015-01-31 LAB — LIPID PANEL
Chol/HDL Ratio: 5.8 ratio units — ABNORMAL HIGH (ref 0.0–4.4)
Cholesterol, Total: 267 mg/dL — ABNORMAL HIGH (ref 100–199)
HDL: 46 mg/dL (ref >39)
Triglycerides: 409 mg/dL — ABNORMAL HIGH (ref 0–149)

## 2015-01-31 LAB — CBC WITH DIFFERENTIAL/PLATELET
Basophils Absolute: 0 10*3/uL (ref 0.0–0.2)
Basos: 0 %
EOS (ABSOLUTE): 0.2 10*3/uL (ref 0.0–0.4)
EOS: 3 %
HEMATOCRIT: 41.7 % (ref 34.0–46.6)
Hemoglobin: 13.9 g/dL (ref 11.1–15.9)
IMMATURE GRANS (ABS): 0 10*3/uL (ref 0.0–0.1)
IMMATURE GRANULOCYTES: 0 %
LYMPHS ABS: 2.8 10*3/uL (ref 0.7–3.1)
Lymphs: 34 %
MCH: 30.2 pg (ref 26.6–33.0)
MCHC: 33.3 g/dL (ref 31.5–35.7)
MCV: 91 fL (ref 79–97)
MONOCYTES: 4 %
MONOS ABS: 0.3 10*3/uL (ref 0.1–0.9)
NEUTROS ABS: 4.8 10*3/uL (ref 1.4–7.0)
Neutrophils: 59 %
Platelets: 177 10*3/uL (ref 150–379)
RBC: 4.6 x10E6/uL (ref 3.77–5.28)
RDW: 13.5 % (ref 12.3–15.4)
WBC: 8.2 10*3/uL (ref 3.4–10.8)

## 2015-01-31 LAB — NMR, LIPOPROFILE
Cholesterol: 256 mg/dL — ABNORMAL HIGH (ref 100–199)
HDL Cholesterol by NMR: 46 mg/dL (ref >39)
HDL Particle Number: 31.7 umol/L (ref >=30.5)
LDL Particle Number: 2043 nmol/L — ABNORMAL HIGH (ref <1000)
LDL Size: 20.9 nm (ref >20.5)
LP-IR Score: 86 — ABNORMAL HIGH (ref <=45)
SMALL LDL PARTICLE NUMBER: 1048 nmol/L — AB (ref <=527)
Triglycerides by NMR: 422 mg/dL — ABNORMAL HIGH (ref 0–149)

## 2015-01-31 LAB — THYROID PANEL WITH TSH
FREE THYROXINE INDEX: 1.7 (ref 1.2–4.9)
T3 UPTAKE RATIO: 28 % (ref 24–39)
T4, Total: 6 ug/dL (ref 4.5–12.0)
TSH: 0.531 u[IU]/mL (ref 0.450–4.500)

## 2015-01-31 LAB — VITAMIN D 25 HYDROXY (VIT D DEFICIENCY, FRACTURES): VIT D 25 HYDROXY: 12.6 ng/mL — AB (ref 30.0–100.0)

## 2015-01-31 MED ORDER — SIMVASTATIN 20 MG PO TABS: 20.0000 mg | ORAL_TABLET | Freq: Every day | ORAL | Status: DC

## 2015-01-31 MED ORDER — ROSUVASTATIN CALCIUM 20 MG PO TABS: 20.0000 mg | ORAL_TABLET | Freq: Every day | ORAL | Status: DC

## 2015-01-31 MED ORDER — VITAMIN D (ERGOCALCIFEROL) 1.25 MG (50000 UNIT) PO CAPS: 50000.0000 [IU] | ORAL_CAPSULE | ORAL | Status: DC

## 2015-01-31 NOTE — Telephone Encounter (Signed)
I'm sending in simvastatin to cover her cholesterol.  The patient specifically requested the Voltaren/diclofenac gel. There isn't a topical substitute similar to this. If she would like I can substitute a tablet form of joint pain medicine.

## 2015-01-31 NOTE — Telephone Encounter (Signed)
Medicaid will not pay for Crestor  Preferred meds are atorvastatin, lovastatin, pravastatin, simvastatin  Medicaid will not cover Diclofenac 1% gel  Preferred meds are : ibuprofen susp., indomethacine, ketorolac, meloxicam, naproxen EC tab., naproxen sodium tab., naproxen tablet and sulindac tablet.

## 2015-02-02 ENCOUNTER — Telehealth: Payer: Self-pay

## 2015-02-02 NOTE — Telephone Encounter (Signed)
Pindolol is non preferred with medicaid    Preferred meds are Atenolol, carvedilol, labetalol, metroprolol succinate XL, metroprolol tartrate, propranolol, sorine, sotalol

## 2015-02-03 ENCOUNTER — Encounter: Payer: Self-pay | Admitting: Cardiology

## 2015-02-03 ENCOUNTER — Telehealth: Payer: Self-pay | Admitting: *Deleted

## 2015-02-03 ENCOUNTER — Ambulatory Visit (INDEPENDENT_AMBULATORY_CARE_PROVIDER_SITE_OTHER): Payer: Medicaid Other | Admitting: Cardiology

## 2015-02-03 VITALS — BP 138/84 | HR 61 | Ht 67.0 in | Wt 198.0 lb

## 2015-02-03 DIAGNOSIS — I10 Essential (primary) hypertension: Secondary | ICD-10-CM | POA: Diagnosis not present

## 2015-02-03 DIAGNOSIS — Z950 Presence of cardiac pacemaker: Secondary | ICD-10-CM

## 2015-02-03 DIAGNOSIS — I495 Sick sinus syndrome: Secondary | ICD-10-CM

## 2015-02-03 MED ORDER — CARVEDILOL 6.25 MG PO TABS: 6.2500 mg | ORAL_TABLET | Freq: Two times a day (BID) | ORAL | Status: DC

## 2015-02-03 MED ORDER — VITAMIN D (ERGOCALCIFEROL) 1.25 MG (50000 UNIT) PO CAPS: ORAL_CAPSULE | ORAL | Status: DC

## 2015-02-03 NOTE — Patient Instructions (Signed)
Your physician recommends that you schedule a follow-up appointment in: with Dr. [name]   Your physician wants you to follow-up in: 6 months with Dr. Diona Browner. You will receive a reminder letter in the mail two months in advance. If you don't receive a letter, please call our office to schedule the follow-up appointment.  Your physician recommends that you continue on your current medications as directed. Please refer to the Current Medication list given to you today.  Thank you for choosing [facility]!
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
## 2015-02-03 NOTE — Addendum Note (Signed)
Addended by: Tamera Punt on: 02/03/2015 11:22 AM   Modules accepted: Orders

## 2015-02-03 NOTE — Telephone Encounter (Signed)
Pt called stating BS is 540 and she cannot afford insulin Pt instructed to go to the ED Verbalizes understanding

## 2015-02-03 NOTE — Telephone Encounter (Signed)
Please explained this to the patient. Let her know that I sent in carvedilol to be taken twice a day as the most up-to-date substitute. In fact most people would say it is a superior medicine.

## 2015-02-06 NOTE — Telephone Encounter (Signed)
Patient aware and verbalizes understanding. 

## 2015-02-08 ENCOUNTER — Ambulatory Visit (INDEPENDENT_AMBULATORY_CARE_PROVIDER_SITE_OTHER): Payer: Medicaid Other | Admitting: Internal Medicine

## 2015-02-08 ENCOUNTER — Encounter: Payer: Self-pay | Admitting: Internal Medicine

## 2015-02-08 VITALS — BP 140/100 | HR 96 | Ht 67.0 in | Wt 202.0 lb

## 2015-02-08 DIAGNOSIS — Z01810 Encounter for preprocedural cardiovascular examination: Secondary | ICD-10-CM

## 2015-02-08 DIAGNOSIS — Z45018 Encounter for adjustment and management of other part of cardiac pacemaker: Secondary | ICD-10-CM

## 2015-02-08 DIAGNOSIS — I10 Essential (primary) hypertension: Secondary | ICD-10-CM | POA: Diagnosis not present

## 2015-02-08 DIAGNOSIS — I495 Sick sinus syndrome: Secondary | ICD-10-CM

## 2015-02-08 DIAGNOSIS — Z950 Presence of cardiac pacemaker: Secondary | ICD-10-CM

## 2015-02-08 LAB — CUP PACEART INCLINIC DEVICE CHECK
Brady Statistic RA Percent Paced: 46 %
Brady Statistic RV Percent Paced: 0 %
Date Time Interrogation Session: 20160518040000
Lead Channel Impedance Value: 370 Ohm
Lead Channel Impedance Value: 590 Ohm
Lead Channel Pacing Threshold Amplitude: 0.6 V
Lead Channel Pacing Threshold Pulse Width: 0.4 ms
Lead Channel Sensing Intrinsic Amplitude: 0.6 mV
Lead Channel Sensing Intrinsic Amplitude: 11.7 mV
Lead Channel Setting Pacing Amplitude: 2 V
Lead Channel Setting Pacing Amplitude: 2 V
Lead Channel Setting Pacing Pulse Width: 0.4 ms
MDC IDC MSMT LEADCHNL RA PACING THRESHOLD AMPLITUDE: 0.6 V
MDC IDC MSMT LEADCHNL RA PACING THRESHOLD PULSEWIDTH: 0.4 ms
MDC IDC PG SERIAL: 573704
MDC IDC SET LEADCHNL RV SENSING SENSITIVITY: 2.5 mV

## 2015-02-08 MED ORDER — CARVEDILOL 12.5 MG PO TABS: 12.5000 mg | ORAL_TABLET | Freq: Two times a day (BID) | ORAL | Status: DC

## 2015-02-08 NOTE — Assessment & Plan Note (Signed)
She is encouraged to stop smoking. She will continue her bronchodilators.

## 2015-02-08 NOTE — Assessment & Plan Note (Signed)
Her blood pressure is elevated. I have asked her to increase coreg to 12.5 mg twice daily.

## 2015-02-08 NOTE — Patient Instructions (Signed)
Medication Instructions:  Your physician has recommended you make the following change in your medication:  1) INCREASE Carvedilol to 12.5 mg twice a day  Labwork: None ordered  Testing/Procedures: None ordered  Follow-Up: Your physician wants you to follow-up in: 6 months with device clinic.  You will receive a reminder letter in the mail two months in advance. If you don't receive a letter, please call our office to schedule the follow-up appointment.  Your physician wants you to follow-up in: 1 year with Dr. [name]. You will receive a reminder letter in the mail two months in advance. If you don't receive a letter, please call our office to schedule the follow-up appointment.   Thank you for choosing [facility]!!

## 2015-02-08 NOTE — Assessment & Plan Note (Signed)
Her [employer] DDD PM is working normally. Will recheck in several months. 

## 2015-02-08 NOTE — Assessment & Plan Note (Signed)
She is low risk for dental extraction. She may stop her plavix if needed for teeth extraction.
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
## 2015-02-09 ENCOUNTER — Telehealth: Payer: Self-pay | Admitting: Family Medicine

## 2015-02-09 NOTE — Telephone Encounter (Signed)
Pt aware note ready 
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
## 2015-02-09 NOTE — Telephone Encounter (Signed)
From a cardiovascular standpoint the patient recently had a preop clearance for dental extraction. She should continue to monitor her blood sugars closely and take extra insulin if needed and she should call the office if there is any problem with her sugars running too high. She should be okay for dental extraction. Please give her a note to take to her dental surgeon okaying the procedure

## 2015-02-23 ENCOUNTER — Encounter: Payer: Self-pay | Admitting: Family Medicine

## 2015-02-23 ENCOUNTER — Ambulatory Visit (INDEPENDENT_AMBULATORY_CARE_PROVIDER_SITE_OTHER): Payer: Medicaid Other | Admitting: Family Medicine

## 2015-02-23 ENCOUNTER — Telehealth: Payer: Self-pay | Admitting: Family Medicine

## 2015-02-23 VITALS — BP 159/91 | HR 83 | Temp 97.9°F | Ht 67.0 in | Wt 210.2 lb

## 2015-02-23 DIAGNOSIS — R309 Painful micturition, unspecified: Secondary | ICD-10-CM | POA: Diagnosis not present

## 2015-02-23 DIAGNOSIS — R609 Edema, unspecified: Secondary | ICD-10-CM

## 2015-02-23 DIAGNOSIS — N898 Other specified noninflammatory disorders of vagina: Secondary | ICD-10-CM | POA: Diagnosis not present

## 2015-02-23 LAB — POCT URINALYSIS DIPSTICK
BILIRUBIN UA: NEGATIVE
GLUCOSE UA: 500
Ketones, UA: NEGATIVE
Nitrite, UA: NEGATIVE
Protein, UA: NEGATIVE
Spec Grav, UA: 1.015
UROBILINOGEN UA: NEGATIVE
pH, UA: 6

## 2015-02-23 LAB — POCT WET PREP (WET MOUNT)
KOH WET PREP POC: POSITIVE
Trichomonas Wet Prep HPF POC: NEGATIVE

## 2015-02-23 LAB — POCT UA - MICROSCOPIC ONLY
Bacteria, U Microscopic: NEGATIVE
CASTS, UR, LPF, POC: NEGATIVE
CRYSTALS, UR, HPF, POC: NEGATIVE
Mucus, UA: NEGATIVE

## 2015-02-23 MED ORDER — FLUCONAZOLE 100 MG PO TABS: 100.0000 mg | ORAL_TABLET | Freq: Every day | ORAL | Status: DC

## 2015-02-23 MED ORDER — POTASSIUM CHLORIDE CRYS ER 20 MEQ PO TBCR: 20.0000 meq | EXTENDED_RELEASE_TABLET | Freq: Every day | ORAL | Status: DC

## 2015-02-23 MED ORDER — FUROSEMIDE 40 MG PO TABS: 40.0000 mg | ORAL_TABLET | Freq: Every day | ORAL | Status: DC

## 2015-02-23 NOTE — Telephone Encounter (Signed)
Been on antibiotic for a while now - since seen Stacks - hosp also put her on antibiotic  For teeth and kidney infection.  Yeast ? Now - very irritated.  Also Feet and legs retaining fluid.   appt June 13  Been out of fluid pill for 9 mos. - lasix 20 was what she used to take.   Taking all other meds as directed.  What do you suggest.

## 2015-02-23 NOTE — Telephone Encounter (Signed)
appt made

## 2015-02-23 NOTE — Progress Notes (Signed)
Subjective:  Patient ID: Myriam Forehand, female    DOB: February 27, 1966  Age: 49 y.o. MRN: [id number]  CC: vaginal irritation; painful urination; and edema   HPI AMIT MELOY presents for increasing vaginal irritation without discharge. Of note is that she has been on and is continuing to be on antibiotic for respiratory infections. She has had some dysuria as well. Her main concern is that her abdomen is swelling and her hands and feet are also swelling. She has taken furosemide in the past for this and has not been on it for the last several months. However she denies shortness of breath. Energy level is stable. Swelling has been increased recently and she states she's put 20 pounds on in her belly. She feels that she is 9 months pregnant because she is so bloated  History Othel has a past medical history of COPD (chronic obstructive pulmonary disease); Type 2 diabetes mellitus; Essential hypertension; Hypothyroidism; Nephrolithiasis; Sleep apnea; Inappropriate sinus tachycardia; Vasovagal syncope; History of stroke; and History of cardiac catheterization.   She has past surgical history that includes Cesarean section; Cholecystectomy; Knee arthroscopy; and Pacemaker insertion.   Her family history includes Diabetes in her brother, mother, and sister; Emphysema in her father; Heart attack in her father; Heart disease in her father. There is no history of Colon cancer.She reports that she has been smoking Cigarettes.  She started smoking about 42 years ago. She has been smoking about 2.00 packs per day. She has never used smokeless tobacco. She reports that she does not drink alcohol or use illicit drugs.  Outpatient Prescriptions Prior to Visit  Medication Sig Dispense Refill  . albuterol (PROVENTIL) (2.5 MG/3ML) 0.083% nebulizer solution Take 3 mLs (2.5 mg total) by nebulization every 6 (six) hours as needed for wheezing or shortness of breath. 75 mL 12  . aspirin EC 81 MG tablet Take 81 mg by  mouth daily.    . carvedilol (COREG) 12.5 MG tablet Take 1 tablet (12.5 mg total) by mouth 2 (two) times daily with a meal. 60 tablet 6  . clopidogrel (PLAVIX) 75 MG tablet Take 1 tablet (75 mg total) by mouth daily. 30 tablet 5  . diclofenac sodium (VOLTAREN) 1 % GEL Apply 4 g topically 4 (four) times daily. 200 g 11  . fluticasone-salmeterol (ADVAIR HFA) 115-21 MCG/ACT inhaler Inhale 2 puffs into the lungs 2 (two) times daily. 1 Inhaler 12  . insulin aspart (NOVOLOG) 100 UNIT/ML injection Inject 15 Units into the skin 3 (three) times daily before meals. 15 mL 11  . insulin detemir (LEVEMIR) 100 UNIT/ML injection Inject 0.8 mLs (80 Units total) into the skin at bedtime. 30 mL 11  . pantoprazole (PROTONIX) 40 MG tablet Take 1 tablet (40 mg total) by mouth daily. For stomach 30 tablet 3  . promethazine (PHENERGAN) 25 MG tablet Take 1 tablet (25 mg total) by mouth every 6 (six) hours as needed for nausea or vomiting. 10 tablet 0  . simvastatin (ZOCOR) 20 MG tablet Take 1 tablet (20 mg total) by mouth daily after supper. 90 tablet 3  . tiotropium (SPIRIVA HANDIHALER) 18 MCG inhalation capsule Place 1 capsule (18 mcg total) into inhaler and inhale daily. 30 capsule 11  . tiZANidine (ZANAFLEX) 4 MG tablet Take 3 tablets (12 mg total) by mouth at bedtime. 90 tablet 0  . Vitamin D, Ergocalciferol, (DRISDOL) 50000 UNITS CAPS capsule Take 1 tablet twice weekly for 2 months 16 capsule 0  . blood glucose meter kit  and supplies KIT Dispense based on patient and insurance preference. Use up to four times daily as directed. (FOR ICD-9 250.00, 250.01). 1 each 0  . levofloxacin (LEVAQUIN) 500 MG tablet Take 1 tablet (500 mg total) by mouth daily. Antibiotic for kidney 7 tablet 0   No facility-administered medications prior to visit.    ROS Review of Systems  Constitutional: Negative for fever, chills, diaphoresis, appetite change, fatigue and unexpected weight change.  HENT: Negative for congestion, ear pain,  hearing loss, postnasal drip, rhinorrhea, sneezing, sore throat and trouble swallowing.   Eyes: Negative for pain.  Respiratory: Negative for cough, chest tightness and shortness of breath.   Cardiovascular: Negative for chest pain and palpitations.  Gastrointestinal: Positive for nausea, abdominal pain and abdominal distention (feels bloated and states that it is full of fluid.). Negative for vomiting, diarrhea and constipation.  Genitourinary: Negative for dysuria, frequency and menstrual problem.  Musculoskeletal: Negative for joint swelling and arthralgias.  Skin: Negative for rash.  Neurological: Negative for dizziness, weakness, numbness and headaches.  Psychiatric/Behavioral: Negative for dysphoric mood and agitation.    Objective:  BP 159/91 mmHg  Pulse 83  Temp(Src) 97.9 F (36.6 C) (Oral)  Ht 5' 7"  (1.702 m)  Wt 210 lb 3.2 oz (95.346 kg)  BMI 32.91 kg/m2  BP Readings from Last 3 Encounters:  02/23/15 159/91  02/08/15 140/100  02/03/15 138/84    Wt Readings from Last 3 Encounters:  02/23/15 210 lb 3.2 oz (95.346 kg)  02/08/15 202 lb (91.627 kg)  02/03/15 198 lb (89.812 kg)     Physical Exam  Constitutional: She is oriented to person, place, and time. She appears well-developed and well-nourished. No distress.  HENT:  Head: Normocephalic and atraumatic.  Right Ear: External ear normal.  Left Ear: External ear normal.  Nose: Nose normal.  Mouth/Throat: Oropharynx is clear and moist.  Eyes: Conjunctivae and EOM are normal. Pupils are equal, round, and reactive to light.  Neck: Normal range of motion. Neck supple. No thyromegaly present.  Cardiovascular: Normal rate, regular rhythm and normal heart sounds.   No murmur heard. Pulmonary/Chest: Effort normal and breath sounds normal. No respiratory distress. She has no wheezes. She has no rales.  Abdominal: Soft. Bowel sounds are normal. She exhibits distension. She exhibits no mass. There is no tenderness. There is  no rebound and no guarding.  Musculoskeletal: She exhibits edema (2+ ankle edema). She exhibits no tenderness.  Lymphadenopathy:    She has no cervical adenopathy.  Neurological: She is alert and oriented to person, place, and time. She has normal reflexes.  Skin: Skin is warm and dry.  Psychiatric: She has a normal mood and affect. Her behavior is normal. Judgment and thought content normal.    Lab Results  Component Value Date   HGBA1C 12.8 01/30/2015    Lab Results  Component Value Date   WBC 8.2 01/30/2015   HGB 15.1* 01/25/2015   HCT 41.7 01/30/2015   PLT 145* 01/25/2015   GLUCOSE 372* 01/30/2015   CHOL 267* 01/30/2015   CHOL 256* 01/30/2015   TRIG 409* 01/30/2015   TRIG 422* 01/30/2015   HDL 46 01/30/2015   HDL 46 01/30/2015   LDLCALC Comment 01/30/2015   ALT 19 01/30/2015   AST 16 01/30/2015   NA 141 01/30/2015   K 4.5 01/30/2015   CL 103 01/30/2015   CREATININE 0.73 01/30/2015   BUN 10 01/30/2015   CO2 22 01/30/2015   TSH 0.531 01/30/2015   INR 0.87  08/14/2009   HGBA1C 12.8 01/30/2015    Dg Abd 1 View  01/25/2015   CLINICAL DATA:  Left-sided flank pain  EXAM: ABDOMEN - 1 VIEW  COMPARISON:  None.  FINDINGS: Scattered large and small bowel gas is noted. No abnormal mass or abnormal calcifications are noted. Changes consistent with prior cholecystectomy are seen. No acute bony abnormality is noted.  IMPRESSION: No acute abnormality seen.   Electronically Signed   By: Inez Catalina M.D.   On: 01/25/2015 20:59   Ct Renal Stone Study  01/25/2015   CLINICAL DATA:  Chills, vomiting and gross hematuria beginning 01/24/2015 P  EXAM: CT ABDOMEN AND PELVIS WITHOUT CONTRAST  TECHNIQUE: Multidetector CT imaging of the abdomen and pelvis was performed following the standard protocol without IV contrast.  COMPARISON:  CT abdomen and pelvis 11/14/2014.  FINDINGS: The lung bases are clear. No pleural or pericardial effusion. Heart size is normal.  There is no hydronephrosis on the  right or left and no renal or ureteral stones are seen. There is stranding about both kidneys which appears increased compared to the prior examination. The urinary bladder is unremarkable. The patient is status post hysterectomy.  The gallbladder has been removed. There is diffuse fatty infiltration of the liver without focal lesion identified. The adrenal glands, spleen, pancreas and biliary tree appear normal. The stomach, small and large bowel and appendix appear normal. There is no lymphadenopathy or fluid collection. No lytic or sclerotic bony lesion is identified.  IMPRESSION: Negative for urinary tract stone or hydronephrosis.  Stranding about both kidneys is worse on the left. While nonspecific, this could be due to pyelonephritis.  Fatty infiltration of the liver.  Status post cholecystectomy and hysterectomy.   Electronically Signed   By: Inge Rise M.D.   On: 01/25/2015 21:38    Assessment & Plan:   Pamella was seen today for vaginal irritation, painful urination and edema.  Diagnoses and all orders for this visit:  Vaginal irritation Orders: -     POCT Wet Prep [facility])  Painful urination Orders: -     POCT UA - Microscopic Only -     POCT urinalysis dipstick  Edema  Other orders -     fluconazole (DIFLUCAN) 100 MG tablet; Take 1 tablet (100 mg total) by mouth daily. -     furosemide (LASIX) 40 MG tablet; Take 1 tablet (40 mg total) by mouth daily. -     potassium chloride SA (K-DUR,KLOR-CON) 20 MEQ tablet; Take 1 tablet (20 mEq total) by mouth daily.   I have discontinued Ms. Seki's levofloxacin and blood glucose meter kit and supplies. I am also having her start on fluconazole, furosemide, and potassium chloride SA. Additionally, I am having her maintain her aspirin EC, promethazine, fluticasone-salmeterol, insulin detemir, insulin aspart, tiotropium, albuterol, tiZANidine, clopidogrel, diclofenac sodium, pantoprazole, simvastatin, Vitamin D (Ergocalciferol), and  carvedilol.  Meds ordered this encounter  Medications  . fluconazole (DIFLUCAN) 100 MG tablet    Sig: Take 1 tablet (100 mg total) by mouth daily.    Dispense:  5 tablet    Refill:  0  . furosemide (LASIX) 40 MG tablet    Sig: Take 1 tablet (40 mg total) by mouth daily.    Dispense:  30 tablet    Refill:  3  . potassium chloride SA (K-DUR,KLOR-CON) 20 MEQ tablet    Sig: Take 1 tablet (20 mEq total) by mouth daily.    Dispense:  30 tablet    Refill:  3     Follow-up: Return if symptoms worsen or fail to improve and as previously arranged, for diabetes.  Claretta Fraise, M.D.

## 2015-02-23 NOTE — Telephone Encounter (Signed)
Needs to be seen. Please work her in today or tomorrow. Thanks, WS

## 2015-02-27 ENCOUNTER — Encounter: Payer: Self-pay | Admitting: Family Medicine

## 2015-02-27 ENCOUNTER — Other Ambulatory Visit: Payer: Self-pay | Admitting: Family Medicine

## 2015-02-27 LAB — URINE CULTURE

## 2015-02-27 MED ORDER — CIPROFLOXACIN HCL 500 MG PO TABS: 500.0000 mg | ORAL_TABLET | Freq: Two times a day (BID) | ORAL | Status: DC

## 2015-02-28 NOTE — Progress Notes (Signed)
lmtcb

## 2015-02-28 NOTE — Progress Notes (Signed)
Patient aware. Will pick up rx °

## 2015-03-01 ENCOUNTER — Other Ambulatory Visit: Payer: Self-pay | Admitting: *Deleted

## 2015-03-01 ENCOUNTER — Telehealth: Payer: Self-pay | Admitting: Family Medicine

## 2015-03-01 ENCOUNTER — Telehealth: Payer: Self-pay | Admitting: *Deleted

## 2015-03-01 MED ORDER — FLUCONAZOLE 100 MG PO TABS: 100.0000 mg | ORAL_TABLET | Freq: Every day | ORAL | Status: DC

## 2015-03-01 MED ORDER — NITROFURANTOIN MONOHYD MACRO 100 MG PO CAPS: 100.0000 mg | ORAL_CAPSULE | Freq: Two times a day (BID) | ORAL | Status: DC

## 2015-03-01 NOTE — Telephone Encounter (Signed)
Pt wants antibiotic (Cipro) changed due to cross reaction with Zanaflex Please advise Also wants refill on Diflucan

## 2015-03-01 NOTE — Telephone Encounter (Signed)
Please advise 

## 2015-03-01 NOTE — Telephone Encounter (Signed)
I changed to Cipro to Macrobid. It should work just as well. Additionally I refilled the yeast pill, Diflucan, that I had sent in last week.

## 2015-03-02 NOTE — Telephone Encounter (Signed)
Pt notified of med change and Diflucan refilled

## 2015-03-06 ENCOUNTER — Ambulatory Visit: Payer: Medicaid Other | Admitting: Family Medicine

## 2015-03-07 ENCOUNTER — Encounter: Payer: Self-pay | Admitting: Family Medicine

## 2015-03-08 ENCOUNTER — Encounter: Payer: Self-pay | Admitting: Family Medicine

## 2015-03-08 ENCOUNTER — Ambulatory Visit (INDEPENDENT_AMBULATORY_CARE_PROVIDER_SITE_OTHER): Payer: Medicaid Other | Admitting: Family Medicine

## 2015-03-08 VITALS — BP 127/94 | HR 83 | Temp 97.6°F | Ht 67.0 in | Wt 201.6 lb

## 2015-03-08 DIAGNOSIS — R197 Diarrhea, unspecified: Secondary | ICD-10-CM

## 2015-03-08 MED ORDER — ONDANSETRON 8 MG PO TBDP: 8.0000 mg | ORAL_TABLET | Freq: Four times a day (QID) | ORAL | Status: DC | PRN

## 2015-03-08 MED ORDER — DIPHENOXYLATE-ATROPINE 2.5-0.025 MG PO TABS: 2.0000 | ORAL_TABLET | Freq: Four times a day (QID) | ORAL | Status: DC | PRN

## 2015-03-08 NOTE — Progress Notes (Signed)
Subjective:  Patient ID: [name], female    DOB: [birth year]  Age: 49 y.o. MRN: [id number]  CC: GI upset   HPI [name] presents for multiple loose bowel movements over the last 4 days. She has had 5 by her appointment time today. She also has had multiple episodes of vomiting to the point where she is afraid to eat or drink anything. She has no fever chills or sweats. She has some diffuse abdominal discomfort. She is a diabetic and takes insulin. She has not had any low blood sugars. She has not been taking the insulin aspart due to the inability to hold fluids down. This seems to have prevented hypoglycemic episodes. She is chronically treated for gastroesophageal reflux. She has had no excessive heartburn this time. She has diabetic gastroparesis likely contributing to her inability to hold food down.  History [name] has a past medical history of COPD (chronic obstructive pulmonary disease); Type 2 diabetes mellitus; Essential hypertension; Hypothyroidism; Nephrolithiasis; Sleep apnea; Inappropriate sinus tachycardia; Vasovagal syncope; History of stroke; and History of cardiac catheterization.   She has past surgical history that includes Cesarean section; Cholecystectomy; Knee arthroscopy; and Pacemaker insertion.   Her family history includes Diabetes in her brother, mother, and sister; Emphysema in her father; Heart attack in her father; Heart disease in her father. There is no history of Colon cancer.She reports that she has been smoking Cigarettes.  She started smoking about 42 years ago. She has been smoking about 2.00 packs per day. She has never used smokeless tobacco. She reports that she does not drink alcohol or use illicit drugs.  Outpatient Prescriptions Prior to Visit  Medication Sig Dispense Refill  . albuterol (PROVENTIL) (2.5 MG/3ML) 0.083% nebulizer solution Take 3 mLs (2.5 mg total) by nebulization every 6 (six) hours as needed for wheezing or shortness of  breath. 75 mL 12  . aspirin EC 81 MG tablet Take 81 mg by mouth daily.    . carvedilol (COREG) 12.5 MG tablet Take 1 tablet (12.5 mg total) by mouth 2 (two) times daily with a meal. 60 tablet 6  . clopidogrel (PLAVIX) 75 MG tablet Take 1 tablet (75 mg total) by mouth daily. 30 tablet 5  . diclofenac sodium (VOLTAREN) 1 % GEL Apply 4 g topically 4 (four) times daily. 200 g 11  . fluconazole (DIFLUCAN) 100 MG tablet Take 1 tablet (100 mg total) by mouth daily. 7 tablet 0  . fluticasone-salmeterol (ADVAIR HFA) 115-21 MCG/ACT inhaler Inhale 2 puffs into the lungs 2 (two) times daily. 1 Inhaler 12  . furosemide (LASIX) 40 MG tablet Take 1 tablet (40 mg total) by mouth daily. 30 tablet 3  . insulin aspart (NOVOLOG) 100 UNIT/ML injection Inject 15 Units into the skin 3 (three) times daily before meals. 15 mL 11  . insulin detemir (LEVEMIR) 100 UNIT/ML injection Inject 0.8 mLs (80 Units total) into the skin at bedtime. 30 mL 11  . nitrofurantoin, macrocrystal-monohydrate, (MACROBID) 100 MG capsule Take 1 capsule (100 mg total) by mouth 2 (two) times daily. 14 capsule 0  . pantoprazole (PROTONIX) 40 MG tablet Take 1 tablet (40 mg total) by mouth daily. For stomach 30 tablet 3  . potassium chloride SA (K-DUR,KLOR-CON) 20 MEQ tablet Take 1 tablet (20 mEq total) by mouth daily. 30 tablet 3  . promethazine (PHENERGAN) 25 MG tablet Take 1 tablet (25 mg total) by mouth every 6 (six) hours as needed for nausea or vomiting. 10 tablet 0  .  simvastatin (ZOCOR) 20 MG tablet Take 1 tablet (20 mg total) by mouth daily after supper. 90 tablet 3  . tiotropium (SPIRIVA HANDIHALER) 18 MCG inhalation capsule Place 1 capsule (18 mcg total) into inhaler and inhale daily. 30 capsule 11  . tiZANidine (ZANAFLEX) 4 MG tablet Take 3 tablets (12 mg total) by mouth at bedtime. 90 tablet 0  . Vitamin D, Ergocalciferol, (DRISDOL) 50000 UNITS CAPS capsule Take 1 tablet twice weekly for 2 months 16 capsule 0   No facility-administered  medications prior to visit.    ROS Review of Systems  Constitutional: Positive for appetite change and fatigue. Negative for fever, chills and diaphoresis.  HENT: Negative for congestion, ear pain, hearing loss, postnasal drip, rhinorrhea, sore throat and trouble swallowing.   Respiratory: Negative for cough, chest tightness and shortness of breath.   Cardiovascular: Negative for chest pain and palpitations.  Gastrointestinal: Positive for abdominal distention. Negative for abdominal pain, constipation, blood in stool and rectal pain.  Musculoskeletal: Negative for arthralgias.  Skin: Negative for rash.    Objective:  BP 127/94 mmHg  Pulse 83  Temp(Src) 97.6 F (36.4 C) (Oral)  Ht  (1.702 m)  Wt 201 lb 9.6 oz (91.445 kg)  BMI 31.57 kg/m2  BP Readings from Last 3 Encounters:  03/08/15 127/94  02/23/15 159/91  02/08/15 140/100    Wt Readings from Last 3 Encounters:  03/08/15 201 lb 9.6 oz (91.445 kg)  02/23/15 210 lb 3.2 oz (95.346 kg)  02/08/15 202 lb (91.627 kg)     Physical Exam  Constitutional: She is oriented to person, place, and time. She appears well-developed and well-nourished. No distress.  HENT:  Head: Normocephalic and atraumatic.  Right Ear: External ear normal.  Left Ear: External ear normal.  Nose: Nose normal.  Mouth/Throat: Oropharynx is clear and moist.  Eyes: Conjunctivae and EOM are normal. Pupils are equal, round, and reactive to light.  Neck: Normal range of motion. Neck supple. No thyromegaly present.  Cardiovascular: Normal rate, regular rhythm and normal heart sounds.   No murmur heard. Pulmonary/Chest: Effort normal and breath sounds normal. No respiratory distress. She has no wheezes. She has no rales.  Abdominal: Soft. Bowel sounds are normal. She exhibits no distension and no mass. There is tenderness (mild & diffuse). There is no rebound and no guarding.  Lymphadenopathy:    She has no cervical adenopathy.  Neurological: She is  alert and oriented to person, place, and time. She has normal reflexes.  Skin: Skin is warm and dry.  Psychiatric: She has a normal mood and affect. Her behavior is normal. Judgment and thought content normal.    Lab Results  Component Value Date   HGBA1C 12.8 01/30/2015    Lab Results  Component Value Date   WBC 8.2 01/30/2015   HGB 15.1* 01/25/2015   HCT 41.7 01/30/2015   PLT 145* 01/25/2015   GLUCOSE 372* 01/30/2015   CHOL 267* 01/30/2015   CHOL 256* 01/30/2015   TRIG 409* 01/30/2015   TRIG 422* 01/30/2015   HDL 46 01/30/2015   HDL 46 01/30/2015   LDLCALC Comment 01/30/2015   ALT 19 01/30/2015   AST 16 01/30/2015   NA 141 01/30/2015   K 4.5 01/30/2015   CL 103 01/30/2015   CREATININE 0.73 01/30/2015   BUN 10 01/30/2015   CO2 22 01/30/2015   TSH 0.531 01/30/2015   INR 0.87 08/14/2009   HGBA1C 12.8 01/30/2015    Dg Abd 1 View  01/25/2015  CLINICAL DATA:  Left-sided flank pain  EXAM: ABDOMEN - 1 VIEW  COMPARISON:  None.  FINDINGS: Scattered large and small bowel gas is noted. No abnormal mass or abnormal calcifications are noted. Changes consistent with prior cholecystectomy are seen. No acute bony abnormality is noted.  IMPRESSION: No acute abnormality seen.   Electronically Signed   By: Alcide Clever M.D.   On: 01/25/2015 20:59   Ct Renal Stone Study  01/25/2015   CLINICAL DATA:  Chills, vomiting and gross hematuria beginning 01/24/2015 P  EXAM: CT ABDOMEN AND PELVIS WITHOUT CONTRAST  TECHNIQUE: Multidetector CT imaging of the abdomen and pelvis was performed following the standard protocol without IV contrast.  COMPARISON:  CT abdomen and pelvis 11/14/2014.  FINDINGS: The lung bases are clear. No pleural or pericardial effusion. Heart size is normal.  There is no hydronephrosis on the right or left and no renal or ureteral stones are seen. There is stranding about both kidneys which appears increased compared to the prior examination. The urinary bladder is unremarkable. The  patient is status post hysterectomy.  The gallbladder has been removed. There is diffuse fatty infiltration of the liver without focal lesion identified. The adrenal glands, spleen, pancreas and biliary tree appear normal. The stomach, small and large bowel and appendix appear normal. There is no lymphadenopathy or fluid collection. No lytic or sclerotic bony lesion is identified.  IMPRESSION: Negative for urinary tract stone or hydronephrosis.  Stranding about both kidneys is worse on the left. While nonspecific, this could be due to pyelonephritis.  Fatty infiltration of the liver.  Status post cholecystectomy and hysterectomy.   Electronically Signed   By: Drusilla Kanner M.D.   On: 01/25/2015 21:38    Assessment & Plan:   Leahna was seen today for gi upset.  Diagnoses and all orders for this visit:  Diarrhea Orders: -     Stool culture -     Clostridium difficile EIA -     GI pathogen panel by PCR, stool  Other orders -     ondansetron (ZOFRAN-ODT) 8 MG disintegrating tablet; Take 1 tablet (8 mg total) by mouth every 6 (six) hours as needed for nausea or vomiting. -     diphenoxylate-atropine (LOMOTIL) 2.5-0.025 MG per tablet; Take 2 tablets by mouth 4 (four) times daily as needed for diarrhea or loose stools.  I am having Ms. Masella start on ondansetron and diphenoxylate-atropine. I am also having her maintain her aspirin EC, promethazine, fluticasone-salmeterol, insulin detemir, insulin aspart, tiotropium, albuterol, tiZANidine, clopidogrel, diclofenac sodium, pantoprazole, simvastatin, Vitamin D (Ergocalciferol), carvedilol, furosemide, potassium chloride SA, nitrofurantoin (macrocrystal-monohydrate), and fluconazole.  Meds ordered this encounter  Medications  . ondansetron (ZOFRAN-ODT) 8 MG disintegrating tablet    Sig: Take 1 tablet (8 mg total) by mouth every 6 (six) hours as needed for nausea or vomiting.    Dispense:  20 tablet    Refill:  1  . diphenoxylate-atropine (LOMOTIL)  2.5-0.025 MG per tablet    Sig: Take 2 tablets by mouth 4 (four) times daily as needed for diarrhea or loose stools.    Dispense:  30 tablet    Refill:  0     Follow-up: No Follow-up on file.  Mechele Claude, M.D.

## 2015-03-10 ENCOUNTER — Telehealth: Payer: Self-pay | Admitting: Family Medicine

## 2015-03-12 LAB — STOOL CULTURE: E coli, Shiga toxin Assay: NEGATIVE

## 2015-03-13 ENCOUNTER — Other Ambulatory Visit: Payer: Self-pay | Admitting: Family Medicine

## 2015-03-13 NOTE — Telephone Encounter (Signed)
Left voicemail for patient to return call.

## 2015-03-13 NOTE — Addendum Note (Signed)
Addended by: Prescott Gum on: 03/13/2015 11:05 AM   Modules accepted: Orders

## 2015-03-15 LAB — GI PATHOGEN PANEL BY PCR, STOOL
C DIFFICILE TOXIN A/B: NOT DETECTED
CRYPTOSPORIDIUM: NOT DETECTED
Campylobacter: NOT DETECTED
E. coli O157: NOT DETECTED
ENTEROTOXIGENIC E COLI (ETEC): NOT DETECTED
Giardia lamblia: NOT DETECTED
NOROVIRUS GI/GII: NOT DETECTED
Rotavirus A: NOT DETECTED
SALMONELLA: NOT DETECTED
Shiga Toxin-producing E. coli: NOT DETECTED
Shigella: NOT DETECTED

## 2015-03-15 LAB — OVA AND PARASITE EXAMINATION

## 2015-03-15 LAB — CLOSTRIDIUM DIFFICILE EIA: C DIFFICILE TOXINS A+ B, EIA: NEGATIVE

## 2015-03-15 NOTE — Telephone Encounter (Signed)
I sent in prescriptions for Lomotil for the diarrhea and ondansetron for the vomiting when she was here the other day. See if she filled those prescriptions and if the medicines helping. If it is not helping then she probably should be seen in the emergency room. If she did not fill them then I highly recommend she go ahead and get them ASAP

## 2015-03-15 NOTE — Telephone Encounter (Signed)
Notified patient of negative stool culture.  She is concerned because she is still vomiting on average of 6 times per day, and having diarrhea about 7 times per day.  She has not been able to hold down solid food.  She requests advise from Dr. Darlyn Read.

## 2015-03-15 NOTE — Telephone Encounter (Signed)
Pt states she has been taking the lomotil and ondansetron with no relief.  Advised her per Dr. Darlyn Read that she should be seen in ED if symptoms are no better.

## 2015-03-23 ENCOUNTER — Ambulatory Visit (INDEPENDENT_AMBULATORY_CARE_PROVIDER_SITE_OTHER): Payer: Medicaid Other | Admitting: Family Medicine

## 2015-03-23 ENCOUNTER — Encounter: Payer: Self-pay | Admitting: Family Medicine

## 2015-03-23 VITALS — BP 138/77 | HR 60 | Temp 98.3°F | Ht 67.0 in | Wt 205.6 lb

## 2015-03-23 DIAGNOSIS — N23 Unspecified renal colic: Secondary | ICD-10-CM

## 2015-03-23 DIAGNOSIS — R319 Hematuria, unspecified: Secondary | ICD-10-CM

## 2015-03-23 LAB — POCT URINALYSIS DIPSTICK
Bilirubin, UA: NEGATIVE
GLUCOSE UA: 500
Ketones, UA: NEGATIVE
NITRITE UA: NEGATIVE
PROTEIN UA: NEGATIVE
Spec Grav, UA: 1.02
Urobilinogen, UA: NEGATIVE
pH, UA: 6
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
## 2015-03-29 ENCOUNTER — Telehealth: Payer: Self-pay | Admitting: Family Medicine

## 2015-03-29 NOTE — Telephone Encounter (Signed)
I can not do this.

## 2015-03-30 ENCOUNTER — Telehealth: Payer: Self-pay

## 2015-03-30 ENCOUNTER — Ambulatory Visit (HOSPITAL_COMMUNITY)
Admission: RE | Admit: 2015-03-30 | Discharge: 2015-03-30 | Disposition: A | Discharge status: Home / Self Care | Payer: Medicaid Other | Source: Ambulatory Visit | Attending: Family Medicine | Admitting: Family Medicine

## 2015-03-30 DIAGNOSIS — N23 Unspecified renal colic: Secondary | ICD-10-CM

## 2015-03-30 DIAGNOSIS — R934 Abnormal findings on diagnostic imaging of urinary organs: Secondary | ICD-10-CM | POA: Diagnosis not present

## 2015-03-30 DIAGNOSIS — R319 Hematuria, unspecified: Secondary | ICD-10-CM

## 2015-03-30 DIAGNOSIS — R109 Unspecified abdominal pain: Secondary | ICD-10-CM | POA: Insufficient documentation

## 2015-03-30 NOTE — Telephone Encounter (Signed)
Been in bed for a week  Nobody is helping her  Suppose to order a overnight pulse oximetry   Hasn't heard anything about it  Also suppose to go to [facility]??

## 2015-03-30 NOTE — Telephone Encounter (Signed)
Pt notified Dr Darlyn Read can not RX anything for pain Verbalizes understanding

## 2015-03-31 NOTE — Telephone Encounter (Signed)
Patient still in pain.  She had Ct yesterday. Please call her with those results and what else she can do for the pain.

## 2015-03-31 NOTE — Telephone Encounter (Signed)
Pt notified of results Verbalizes understanding 

## 2015-03-31 NOTE — Telephone Encounter (Signed)
CT negative for stone. For pain, go to E.D. Or use OTC analgesics. Refer to pain clinic.

## 2015-04-01 ENCOUNTER — Encounter (HOSPITAL_COMMUNITY): Payer: Self-pay | Admitting: *Deleted

## 2015-04-01 ENCOUNTER — Emergency Department (HOSPITAL_COMMUNITY)
Admission: EM | Admit: 2015-04-01 | Discharge: 2015-04-01 | Disposition: A | Discharge status: Home / Self Care | Payer: Medicaid Other | Attending: Emergency Medicine | Admitting: Emergency Medicine

## 2015-04-01 DIAGNOSIS — Z79899 Other long term (current) drug therapy: Secondary | ICD-10-CM | POA: Insufficient documentation

## 2015-04-01 DIAGNOSIS — E039 Hypothyroidism, unspecified: Secondary | ICD-10-CM | POA: Diagnosis not present

## 2015-04-01 DIAGNOSIS — Z7982 Long term (current) use of aspirin: Secondary | ICD-10-CM | POA: Diagnosis not present

## 2015-04-01 DIAGNOSIS — Z9889 Other specified postprocedural states: Secondary | ICD-10-CM | POA: Insufficient documentation

## 2015-04-01 DIAGNOSIS — E119 Type 2 diabetes mellitus without complications: Secondary | ICD-10-CM | POA: Insufficient documentation

## 2015-04-01 DIAGNOSIS — G8929 Other chronic pain: Secondary | ICD-10-CM | POA: Insufficient documentation

## 2015-04-01 DIAGNOSIS — R109 Unspecified abdominal pain: Secondary | ICD-10-CM | POA: Diagnosis not present

## 2015-04-01 DIAGNOSIS — Z720 Tobacco use: Secondary | ICD-10-CM | POA: Diagnosis not present

## 2015-04-01 DIAGNOSIS — I10 Essential (primary) hypertension: Secondary | ICD-10-CM | POA: Insufficient documentation

## 2015-04-01 DIAGNOSIS — Z9049 Acquired absence of other specified parts of digestive tract: Secondary | ICD-10-CM | POA: Insufficient documentation

## 2015-04-01 DIAGNOSIS — Z87442 Personal history of urinary calculi: Secondary | ICD-10-CM | POA: Insufficient documentation

## 2015-04-01 DIAGNOSIS — Z794 Long term (current) use of insulin: Secondary | ICD-10-CM | POA: Diagnosis not present

## 2015-04-01 DIAGNOSIS — R112 Nausea with vomiting, unspecified: Secondary | ICD-10-CM | POA: Insufficient documentation

## 2015-04-01 DIAGNOSIS — J449 Chronic obstructive pulmonary disease, unspecified: Secondary | ICD-10-CM | POA: Insufficient documentation

## 2015-04-01 DIAGNOSIS — Z8673 Personal history of transient ischemic attack (TIA), and cerebral infarction without residual deficits: Secondary | ICD-10-CM | POA: Diagnosis not present

## 2015-04-01 DIAGNOSIS — Z7902 Long term (current) use of antithrombotics/antiplatelets: Secondary | ICD-10-CM | POA: Diagnosis not present

## 2015-04-01 LAB — URINALYSIS, ROUTINE W REFLEX MICROSCOPIC
Bilirubin Urine: NEGATIVE
Ketones, ur: NEGATIVE mg/dL
Nitrite: NEGATIVE
PH: 6 (ref 5.0–8.0)
Protein, ur: NEGATIVE mg/dL
SPECIFIC GRAVITY, URINE: 1.01 (ref 1.005–1.030)
Urobilinogen, UA: 0.2 mg/dL (ref 0.0–1.0)

## 2015-04-01 LAB — URINE MICROSCOPIC-ADD ON

## 2015-04-01 MED ORDER — OXYCODONE-ACETAMINOPHEN 5-325 MG PO TABS
1.0000 | ORAL_TABLET | Freq: Once | ORAL | Status: AC
Start: 2015-04-01 — End: 2015-04-01
  Administered 2015-04-01: 1 via ORAL
  Filled 2015-04-01: qty 1

## 2015-04-01 NOTE — ED Notes (Addendum)
Pt reporting pain in right flank and lower back.  States that she has seen her PCP and was told to come to ED for pain.  Patient reports that she has been on antibiotics for over a month due to a yeast infection, but has not received treatment for pain.  Pt did have a CT scan a couple days ago which was negative for stone. Pt states that her doctor has recommended treatment at a pain control clinic.

## 2015-04-01 NOTE — ED Provider Notes (Signed)
CSN: [id number]     Arrival date & time 04/01/15  0554 History   First MD Initiated Contact with Patient 04/01/15 [phone]     Chief Complaint  Patient presents with  . Flank Pain     Patient is a 49 y.o. female presenting with flank pain. The history is provided by the patient.  Flank Pain This is a chronic problem. The current episode started more than 1 week ago. The problem occurs daily. The problem has not changed since onset.Pertinent negatives include no chest pain and no abdominal pain. Exacerbated by: movement/palpation. Nothing relieves the symptoms.  pt presents for chronic daily right flank pain for at least one month No trauma/falls recently No midline back pain No dysuria She reports when pain is severe she vomits She reports fever yesterday, Tmax- 102 and she took APAP but she reports "I get fevers a lot" No incontinence reported No focal weakness reported No h/o back surgery She had recent CT imaging that did not reveal any pathology She has been treated for UTI recently but not currently   Past Medical History  Diagnosis Date  . COPD (chronic obstructive pulmonary disease)   . Type 2 diabetes mellitus   . Essential hypertension   . Hypothyroidism   . Nephrolithiasis   . Sleep apnea   . Inappropriate sinus tachycardia     Status post sinus node modification with subsequent SSS  . Vasovagal syncope   . History of stroke   . History of cardiac catheterization     Normal coronary arteries 2002   Past Surgical History  Procedure Laterality Date  . Cesarean section      x2  . Cholecystectomy    . Knee arthroscopy    . Pacemaker insertion      [employer], dual chamber, Dr. [name], 1/09   Family History  Problem Relation Age of Onset  . Colon cancer Neg Hx   . Diabetes Mother   . Diabetes Brother   . Diabetes Sister   . Heart disease Father     Deceased. MI. Mother, 2 brothers, sister, nephew also have heart disease  . Emphysema Father     Died of  it. Was pt of Dr. Sherene Sires   . Heart attack Father    History  Substance Use Topics  . Smoking status: Current Every Day Smoker -- 2.00 packs/day    Types: Cigarettes    Start date: 02/02/1973  . Smokeless tobacco: Never Used     Comment: smokes 1/2-1 ppd since 1974   . Alcohol Use: No   OB History    No data available     Review of Systems  Constitutional: Positive for fever.  Cardiovascular: Negative for chest pain.  Gastrointestinal: Positive for vomiting. Negative for abdominal pain.  Genitourinary: Positive for flank pain.  All other systems reviewed and are negative.     Allergies  Ampicillin and Cyclobenzaprine hcl  Home Medications   Prior to Admission medications   Medication Sig Start Date End Date Taking? Authorizing Provider  albuterol (PROVENTIL) (2.5 MG/3ML) 0.083% nebulizer solution Take 3 mLs (2.5 mg total) by nebulization every 6 (six) hours as needed for wheezing or shortness of breath. 01/30/15   Mechele Claude, MD  aspirin EC 81 MG tablet Take 81 mg by mouth daily.    Historical Provider, MD  carvedilol (COREG) 12.5 MG tablet Take 1 tablet (12.5 mg total) by mouth 2 (two) times daily with a meal. 02/08/15   Marinus Maw,  MD  clopidogrel (PLAVIX) 75 MG tablet Take 1 tablet (75 mg total) by mouth daily. 01/30/15   Mechele Claude, MD  diclofenac sodium (VOLTAREN) 1 % GEL Apply 4 g topically 4 (four) times daily. 01/30/15   Mechele Claude, MD  diphenoxylate-atropine (LOMOTIL) 2.5-0.025 MG per tablet Take 2 tablets by mouth 4 (four) times daily as needed for diarrhea or loose stools. 03/08/15   Mechele Claude, MD  fluconazole (DIFLUCAN) 100 MG tablet Take 1 tablet (100 mg total) by mouth daily. 03/01/15   Mechele Claude, MD  fluticasone-salmeterol (ADVAIR HFA) 8562516076 MCG/ACT inhaler Inhale 2 puffs into the lungs 2 (two) times daily. 01/30/15   Mechele Claude, MD  furosemide (LASIX) 40 MG tablet Take 1 tablet (40 mg total) by mouth daily. 02/23/15   Mechele Claude, MD  insulin aspart  (NOVOLOG) 100 UNIT/ML injection Inject 15 Units into the skin 3 (three) times daily before meals. 01/30/15   Mechele Claude, MD  insulin detemir (LEVEMIR) 100 UNIT/ML injection Inject 0.8 mLs (80 Units total) into the skin at bedtime. 01/30/15   Mechele Claude, MD  ondansetron (ZOFRAN-ODT) 8 MG disintegrating tablet Take 1 tablet (8 mg total) by mouth every 6 (six) hours as needed for nausea or vomiting. 03/08/15   Mechele Claude, MD  pantoprazole (PROTONIX) 40 MG tablet Take 1 tablet (40 mg total) by mouth daily. For stomach 01/30/15   Mechele Claude, MD  potassium chloride SA (K-DUR,KLOR-CON) 20 MEQ tablet Take 1 tablet (20 mEq total) by mouth daily. 02/23/15   Mechele Claude, MD  promethazine (PHENERGAN) 25 MG tablet Take 1 tablet (25 mg total) by mouth every 6 (six) hours as needed for nausea or vomiting. 01/25/15   Tilden Fossa, MD  simvastatin (ZOCOR) 20 MG tablet Take 1 tablet (20 mg total) by mouth daily after supper. 01/31/15   Mechele Claude, MD  tiotropium (SPIRIVA HANDIHALER) 18 MCG inhalation capsule Place 1 capsule (18 mcg total) into inhaler and inhale daily. 01/30/15   Mechele Claude, MD  tiZANidine (ZANAFLEX) 4 MG tablet TAKE 3 TABLETS BY MOUTH EVERY NIGHT AT BEDTIME 03/13/15   Mechele Claude, MD  Vitamin D, Ergocalciferol, (DRISDOL) 50000 UNITS CAPS capsule Take 1 tablet twice weekly for 2 months 02/03/15   Mechele Claude, MD   BP 142/78 mmHg  Pulse 65  Temp(Src) 98.5 F (36.9 C)  Resp 18  Ht 5\' 7"  (1.702 m)  Wt 211 lb (95.709 kg)  BMI 33.04 kg/m2  SpO2 93% Physical Exam CONSTITUTIONAL: Well developed/well nourished HEAD: Normocephalic/atraumatic EYES: EOMI/PERRL ENMT: Mucous membranes moist NECK: supple no meningeal signs SPINE/BACK:entire spine nontender, No bruising/crepitance/stepoffs noted to spine CV: S1/S2 noted, no murmurs/rubs/gallops noted LUNGS: Lungs are clear to auscultation bilaterally, no apparent distress ABDOMEN: soft, nontender, no rebound or guarding BM:[id number] over  right flank.  No bruising/erythema noted.   NEURO: Awake/alert,  equal motor 5/5 strength noted with the following: hip flexion/knee flexion/extension, foot dorsi/plantar flexion, great toe extension intact bilaterally, Pt is able to ambulate unassisted. EXTREMITIES: pulses normal, full ROM SKIN: warm, color normal PSYCH: no abnormalities of mood noted, alert and oriented to situation    ED Course  Procedures  7:00 AM Pt here for persistent right flank pain for one month She has recent CT imaging that was negative for acute disease Per EPIC, she was instructed by PCP to go to ER for pain control Will check u/a and reassess 7:25 AM Pt resting comfortably No evidence of acute UTI This issue has been present for at least month,  with recent negative CT imaging She has no abd tenderness She has no midline back tenderness or focal neuro deficits to suggest spinal pathology I feel she is safe/stable for d/c home Advised need for outpatient management of her pain Urology referral given  Medications  oxyCODONE-acetaminophen (PERCOCET/ROXICET) 5-325 MG per tablet 1 tablet (1 tablet Oral Given 04/01/15 4098)    Labs Review Labs Reviewed  URINALYSIS, ROUTINE W REFLEX MICROSCOPIC (NOT AT [facility]) - Abnormal; Notable for the following:    APPearance CLOUDY (*)    Glucose, UA >1000 (*)    Hgb urine dipstick TRACE (*)    Leukocytes, UA SMALL (*)    All other components within normal limits  URINE MICROSCOPIC-ADD ON - Abnormal; Notable for the following:    Squamous Epithelial / LPF FEW (*)    Bacteria, UA FEW (*)    All other components within normal limits    Imaging Review Ct Renal Stone Study  03/30/2015   CLINICAL DATA:  Right flank pain for 1 month  EXAM: CT ABDOMEN AND PELVIS WITHOUT CONTRAST  TECHNIQUE: Multidetector CT imaging of the abdomen and pelvis was performed following the standard protocol without IV contrast.  COMPARISON:  01/25/2015  FINDINGS: Lung bases are unremarkable.  Partially visualized cardiac pacemaker leads. Sagittal images of the spine shows mild degenerative changes thoracolumbar spine.  Unenhanced liver shows no biliary ductal dilatation. Status postcholecystectomy. Unenhanced pancreas, spleen and adrenal glands are unremarkable. Unenhanced kidneys shows no nephrolithiasis. Again noted mild bilateral perinephric stranding right greater than left. There is subtle mild right periureteral stranding. Recent passed right ureteral calculus or right urinary tract inflammation cannot be excluded. Clinical correlation is necessary.  No aortic aneurysm. No small bowel obstruction. No ascites or free air. No adenopathy. There is no pericecal inflammation. Normal appendix partially visualized in axial image 58. Bilateral distal ureter is unremarkable. The uterus is probable surgically absent. No calcified calculi are noted within urinary bladder. There is no inguinal adenopathy. No destructive bony lesions are noted within pelvis.  IMPRESSION: 1. There is no nephrolithiasis. No calcified ureteral calculi noted. Again noted mild bilateral perinephric stranding right greater than left. There is mild right periureteral stranding. Right urinary tract inflammation or a recent passed right ureteral calculus cannot be excluded. Clinical correlation is necessary. 2. No calcified calculi are noted within urinary bladder. 3. Normal appendix.  No pericecal inflammation. 4. No small bowel obstruction. 5. Status postcholecystectomy.   Electronically Signed   By: Natasha Mead M.D.   On: 03/30/2015 14:35     MDM   Final diagnoses:  Right flank pain    Nursing notes including past medical history and social history reviewed and considered in documentation Labs/vital reviewed myself and considered during evaluation Previous records reviewed and considered Narcotic database reviewed and considered in decision making     Zadie Rhine, MD 04/01/15 [phone]

## 2015-04-01 NOTE — ED Notes (Signed)
nad noted prior to dc. Dc instructions reviewed  Pt voiced understanding on f/u to call

## 2015-04-04 ENCOUNTER — Telehealth: Payer: Self-pay

## 2015-04-04 NOTE — Telephone Encounter (Signed)
Please refer

## 2015-04-04 NOTE — Telephone Encounter (Signed)
Suppose to schedule me with urology

## 2015-04-05 ENCOUNTER — Other Ambulatory Visit: Payer: Self-pay

## 2015-04-05 DIAGNOSIS — R109 Unspecified abdominal pain: Secondary | ICD-10-CM

## 2015-05-17 ENCOUNTER — Other Ambulatory Visit: Payer: Self-pay | Admitting: Family Medicine

## 2015-05-17 NOTE — Telephone Encounter (Signed)
Last seen 03/23/15  [name]  Last Vit D 01/30/15  12.6

## 2015-05-18 ENCOUNTER — Telehealth: Payer: Self-pay | Admitting: Cardiology

## 2015-05-18 NOTE — Telephone Encounter (Signed)
Pt states she self stopped plavix on 8/22,needs clearnce for bladder cysto and biopsy.I have messaged Dr Diona Browner who is in hospital coverage

## 2015-05-18 NOTE — Telephone Encounter (Signed)
pls call pt concerning surgical clearance form

## 2015-05-19 ENCOUNTER — Other Ambulatory Visit: Payer: Self-pay | Admitting: Urology

## 2015-05-19 ENCOUNTER — Telehealth: Payer: Self-pay | Admitting: Cardiology

## 2015-05-19 NOTE — Telephone Encounter (Signed)
[name] is planned to undergo cystoscopy and bladder biopsy with Dr. Brunilda Payor on Tuesday August 30. She has told nursing that she stopped Plavix this Monday, August 22 in anticipation of surgery. I saw her in May of this year followed soon thereafter by visit with Dr. [name] for follow-up of her pacemaker. She has been clinically stable from a cardiac perspective and should be able to proceed with the planned surgery at an acceptable, low perioperative cardiac risk. Of note, she has a history of normal coronary arteries by cardiac catheterization in 2002, inappropriate sinus tachycardia status post sinus node modification and subsequent sinus node dysfunction with [facility] scientific pacemaker followed by Dr. [name]. She is on Plavix due to a previous history of stroke, not for coronary artery disease or prior PCI.  [name], M.D., F.A.C.C.

## 2015-05-22 ENCOUNTER — Encounter (HOSPITAL_BASED_OUTPATIENT_CLINIC_OR_DEPARTMENT_OTHER): Payer: Self-pay | Admitting: *Deleted

## 2015-05-22 NOTE — Progress Notes (Signed)
NPO AFTER MN.  ARRIVE AT 0945.  NEEDS ISTAT.  CURRENT EKG IN CHART AND EPIC.  WILL TAKE COREG, PROTONIX AND DO ADVAIR INHALER/ PROVENTIL NEBULIZER AM DOS W/ SIPS OF WATER.

## 2015-05-22 NOTE — H&P (Signed)
History of Present Illness [name] returns today for follow-up. She still complains of right sided abdominal pain radiating to the suprapubic area. She voids only a small amount of urine at a time. She was prescribed nitrofurantoin for E.coli UTI. Urinalysis today is negative. Cystoscopy shows reddened bladder mucosa. There is no tumor.   Past Medical History Problems  1. History of diabetes mellitus (Z86.39) 2. History of heartburn (Z87.898) 3. History of hypercholesterolemia (Z86.39) 4. History of hypertension (Z86.79) 5. History of myocardial infarction (I25.2) 6. History of sleep apnea (Z87.09) 7. History of transient cerebral ischemia (U04.54)  Surgical History Problems  1. History of Cesarean Section 2. History of Cholecystectomy 3. History of Knee Surgery 4. History of Pacemaker Placement 5. History of Tonsillectomy With Adenoidectomy  Current Meds 1. Albuterol Sulfate (2.5 MG/3ML) 0.083% Inhalation Nebulization Solution;  Therapy: (Recorded:01Aug2016) to Recorded 2. Fluticasone Propionate SUSP;  Therapy: (Recorded:01Aug2016) to Recorded 3. Insulin;  Therapy: (Recorded:01Aug2016) to Recorded 4. Pantoprazole Sodium 40 MG Oral Tablet Delayed Release;  Therapy: (Recorded:01Aug2016) to Recorded 5. Pindolol 10 MG Oral Tablet;  Therapy: (Recorded:01Aug2016) to Recorded 6. Plavix 75 MG Oral Tablet;  Therapy: (Recorded:01Aug2016) to Recorded 7. Promethazine HCl - 25 MG Oral Tablet;  Therapy: (Recorded:01Aug2016) to Recorded 8. Spiriva HandiHaler 18 MCG Inhalation Capsule;  Therapy: (Recorded:01Aug2016) to Recorded 9. TiZANidine HCl - 4 MG Oral Tablet;  Therapy: (Recorded:01Aug2016) to Recorded 10. Voltaren 1 % GEL;   Therapy: (Recorded:01Aug2016) to Recorded  Allergies Medication  1. Ampicillin CAPS 2. cyclobenzaprine  Family History Problems  1. Family history of kidney stones (Z84.1) : Father, Brother 2. Family history of Hematuria, microscopic :  Mother  Social History Problems  1. Denied: History of Alcohol use 2. Caffeine use (F15.90) 3. Cigarette smoker (F17.210) 4. Current every day smoker (F17.200) 5. Disabled 6. Divorced 7. Father deceased   25 8. Mother alive and healthy   13 9. Number of children   2 sons  Review of Systems Genitourinary, constitutional, skin, eye, otolaryngeal, hematologic/lymphatic, cardiovascular, pulmonary, endocrine, musculoskeletal, gastrointestinal, neurological and psychiatric system(s) were reviewed and pertinent findings if present are noted and are otherwise negative.  Genitourinary: urinary urgency, nocturia and hematuria.  Gastrointestinal: nausea and diarrhea.  ENT: sinus problems.  Cardiovascular: leg swelling.  Respiratory: cough.  Musculoskeletal: back pain.    Vitals Vital Signs [Data Includes: Last 1 Day]  Recorded: 19Aug2016 01:27PM  Height: 5 ft 7 in Weight: 196 lb  BMI Calculated: 30.7 BSA Calculated: 2 Temperature: 97.5 F  Physical Exam Constitutional: Well nourished and well developed . No acute distress.  ENT:. The ears and nose are normal in appearance.  Neck: The appearance of the neck is normal and no neck mass is present.  Pulmonary: No respiratory distress and normal respiratory rhythm and effort.  Cardiovascular: Heart rate and rhythm are normal . No peripheral edema.  Abdomen: The abdomen is soft and nontender. No masses are palpated. No CVA tenderness. No hernias are palpable. No hepatosplenomegaly noted.  Lymphatics: The femoral and inguinal nodes are not enlarged or tender.  Skin: Normal skin turgor, no visible rash and no visible skin lesions.  Neuro/Psych:. Mood and affect are appropriate.    Results/Data Urine [Data Includes: Last 1 Day]   19Aug2016  COLOR YELLOW   APPEARANCE CLEAR   SPECIFIC GRAVITY 1.010   pH 5.5   GLUCOSE NEGATIVE   BILIRUBIN NEGATIVE   KETONE NEGATIVE   BLOOD NEGATIVE   PROTEIN NEGATIVE   NITRITE NEGATIVE    LEUKOCYTE ESTERASE NEGATIVE  Procedure  Procedure: Cystoscopy  Chaperone Present: Sima Matas, CMA.  Indication: Lower Urinary Tract Symptoms.  Informed Consent: Risks, benefits, and potential adverse events were discussed and informed consent was obtained from the patient . Specific risks including, but not limited to bleeding, infection, pain, allergic reaction etc. were explained.  Prep: The patient was prepped with betadine.  Anesthesia:. Local anesthesia was administered intraurethrally with 2% lidocaine jelly.  Procedure Note:  Urethral meatus:. No abnormalities.  Anterior urethra: No abnormalities.  Bladder: Visulization was clear. The ureteral orifices were in the normal anatomic position bilaterally. A systematic survey of the bladder demonstrated no bladder tumors or stones. Examination of the bladder demonstrated erythematous mucosa. The patient tolerated the procedure well.  Complications: None.    Assessment Assessed  1. Female pelvic pain (R10.2)  Plan Health Maintenance  1. UA With REFLEX; [Do Not Release]; Status:Complete;   Done: 19Aug2016 01:20PM  Needs cystoscopy, bladder biopsy. The procedure, risks, benefits were explained to the patient and her fiance. The risks include but are not limited to hemorrhage, infection, bladder injury. She understands and wishes to proceed.

## 2015-05-23 ENCOUNTER — Ambulatory Visit (HOSPITAL_BASED_OUTPATIENT_CLINIC_OR_DEPARTMENT_OTHER): Payer: Medicaid Other | Admitting: Anesthesiology

## 2015-05-23 ENCOUNTER — Encounter (HOSPITAL_BASED_OUTPATIENT_CLINIC_OR_DEPARTMENT_OTHER)
Admission: RE | Disposition: A | Discharge status: Home / Self Care | Payer: Self-pay | Source: Ambulatory Visit | Attending: Urology

## 2015-05-23 ENCOUNTER — Ambulatory Visit (HOSPITAL_BASED_OUTPATIENT_CLINIC_OR_DEPARTMENT_OTHER)
Admission: RE | Admit: 2015-05-23 | Discharge: 2015-05-23 | Disposition: A | Discharge status: Home / Self Care | Payer: Medicaid Other | Source: Ambulatory Visit | Attending: Urology | Admitting: Urology

## 2015-05-23 ENCOUNTER — Encounter (HOSPITAL_BASED_OUTPATIENT_CLINIC_OR_DEPARTMENT_OTHER): Payer: Self-pay

## 2015-05-23 DIAGNOSIS — N3030 Trigonitis without hematuria: Secondary | ICD-10-CM | POA: Insufficient documentation

## 2015-05-23 DIAGNOSIS — E780 Pure hypercholesterolemia: Secondary | ICD-10-CM | POA: Diagnosis not present

## 2015-05-23 DIAGNOSIS — I252 Old myocardial infarction: Secondary | ICD-10-CM | POA: Diagnosis not present

## 2015-05-23 DIAGNOSIS — K219 Gastro-esophageal reflux disease without esophagitis: Secondary | ICD-10-CM | POA: Diagnosis not present

## 2015-05-23 DIAGNOSIS — Z841 Family history of disorders of kidney and ureter: Secondary | ICD-10-CM | POA: Diagnosis not present

## 2015-05-23 DIAGNOSIS — M1990 Unspecified osteoarthritis, unspecified site: Secondary | ICD-10-CM | POA: Insufficient documentation

## 2015-05-23 DIAGNOSIS — J45909 Unspecified asthma, uncomplicated: Secondary | ICD-10-CM | POA: Insufficient documentation

## 2015-05-23 DIAGNOSIS — Z8673 Personal history of transient ischemic attack (TIA), and cerebral infarction without residual deficits: Secondary | ICD-10-CM | POA: Diagnosis not present

## 2015-05-23 DIAGNOSIS — Z791 Long term (current) use of non-steroidal anti-inflammatories (NSAID): Secondary | ICD-10-CM | POA: Insufficient documentation

## 2015-05-23 DIAGNOSIS — F17210 Nicotine dependence, cigarettes, uncomplicated: Secondary | ICD-10-CM | POA: Diagnosis not present

## 2015-05-23 DIAGNOSIS — Z79899 Other long term (current) drug therapy: Secondary | ICD-10-CM | POA: Insufficient documentation

## 2015-05-23 DIAGNOSIS — E119 Type 2 diabetes mellitus without complications: Secondary | ICD-10-CM | POA: Diagnosis not present

## 2015-05-23 DIAGNOSIS — I10 Essential (primary) hypertension: Secondary | ICD-10-CM | POA: Insufficient documentation

## 2015-05-23 DIAGNOSIS — Z794 Long term (current) use of insulin: Secondary | ICD-10-CM | POA: Diagnosis not present

## 2015-05-23 DIAGNOSIS — R102 Pelvic and perineal pain: Secondary | ICD-10-CM | POA: Insufficient documentation

## 2015-05-23 DIAGNOSIS — Z7951 Long term (current) use of inhaled steroids: Secondary | ICD-10-CM | POA: Diagnosis not present

## 2015-05-23 DIAGNOSIS — Z7902 Long term (current) use of antithrombotics/antiplatelets: Secondary | ICD-10-CM | POA: Diagnosis not present

## 2015-05-23 DIAGNOSIS — K449 Diaphragmatic hernia without obstruction or gangrene: Secondary | ICD-10-CM | POA: Diagnosis not present

## 2015-05-23 DIAGNOSIS — J449 Chronic obstructive pulmonary disease, unspecified: Secondary | ICD-10-CM | POA: Insufficient documentation

## 2015-05-23 DIAGNOSIS — G4730 Sleep apnea, unspecified: Secondary | ICD-10-CM | POA: Insufficient documentation

## 2015-05-23 DIAGNOSIS — Z950 Presence of cardiac pacemaker: Secondary | ICD-10-CM | POA: Diagnosis not present

## 2015-05-23 HISTORY — DX: Gastroparesis: K31.84

## 2015-05-23 HISTORY — DX: Sick sinus syndrome: I49.5

## 2015-05-23 HISTORY — DX: Other specified chronic obstructive pulmonary disease: J44.89

## 2015-05-23 HISTORY — DX: Obstructive sleep apnea (adult) (pediatric): G47.33

## 2015-05-23 HISTORY — PX: CYSTOSCOPY WITH HYDRODISTENSION AND BIOPSY: SHX5127

## 2015-05-23 HISTORY — DX: Crohn's disease, unspecified, without complications: K50.90

## 2015-05-23 HISTORY — DX: Chronic obstructive pulmonary disease, unspecified: J44.9

## 2015-05-23 HISTORY — DX: Major depressive disorder, single episode, unspecified: F32.9

## 2015-05-23 HISTORY — DX: Presence of spectacles and contact lenses: Z97.3

## 2015-05-23 HISTORY — DX: Personal history of other specified conditions: Z87.898

## 2015-05-23 HISTORY — DX: Presence of cardiac pacemaker: Z95.0

## 2015-05-23 HISTORY — DX: Personal history of other diseases of the digestive system: Z87.19

## 2015-05-23 HISTORY — DX: Pelvic and perineal pain: R10.2

## 2015-05-23 HISTORY — DX: Gastro-esophageal reflux disease without esophagitis: K21.9

## 2015-05-23 HISTORY — DX: Unspecified osteoarthritis, unspecified site: M19.90

## 2015-05-23 HISTORY — DX: Personal history of urinary calculi: Z87.442

## 2015-05-23 LAB — POCT I-STAT 4, (NA,K, GLUC, HGB,HCT)
GLUCOSE: 256 mg/dL — AB (ref 65–99)
HEMATOCRIT: 46 % (ref 36.0–46.0)
Hemoglobin: 15.6 g/dL — ABNORMAL HIGH (ref 12.0–15.0)
POTASSIUM: 4.7 mmol/L (ref 3.5–5.1)
SODIUM: 137 mmol/L (ref 135–145)

## 2015-05-23 LAB — GLUCOSE, CAPILLARY: Glucose-Capillary: 240 mg/dL — ABNORMAL HIGH (ref 65–99)

## 2015-05-23 SURGERY — CYSTOSCOPY, WITH BLADDER HYDRODISTENSION AND BIOPSY
Anesthesia: General

## 2015-05-23 MED ORDER — HYDROCODONE-ACETAMINOPHEN 5-325 MG PO TABS
1.0000 | ORAL_TABLET | Freq: Once | ORAL | Status: AC
Start: 2015-05-23 — End: 2015-05-23
  Administered 2015-05-23: 1 via ORAL
  Filled 2015-05-23: qty 1

## 2015-05-23 MED ORDER — FENTANYL CITRATE (PF) 100 MCG/2ML IJ SOLN
INTRAMUSCULAR | Status: AC
  Filled 2015-05-23: qty 2

## 2015-05-23 MED ORDER — SCOPOLAMINE 1 MG/3DAYS TD PT72
MEDICATED_PATCH | TRANSDERMAL | Status: AC
  Filled 2015-05-23: qty 1

## 2015-05-23 MED ORDER — FENTANYL CITRATE (PF) 100 MCG/2ML IJ SOLN
INTRAMUSCULAR | Status: AC
  Filled 2015-05-23: qty 4

## 2015-05-23 MED ORDER — FENTANYL CITRATE (PF) 100 MCG/2ML IJ SOLN
INTRAMUSCULAR | Status: DC | PRN
  Administered 2015-05-23: 50 ug via INTRAVENOUS

## 2015-05-23 MED ORDER — STERILE WATER FOR IRRIGATION IR SOLN
Status: DC | PRN
  Administered 2015-05-23: 6500 mL via INTRAVESICAL

## 2015-05-23 MED ORDER — MIDAZOLAM HCL 5 MG/5ML IJ SOLN
INTRAMUSCULAR | Status: DC | PRN
  Administered 2015-05-23: 2 mg via INTRAVENOUS

## 2015-05-23 MED ORDER — PROMETHAZINE HCL 25 MG/ML IJ SOLN
6.2500 mg | INTRAMUSCULAR | Status: DC | PRN
  Administered 2015-05-23: 6.25 mg via INTRAVENOUS
  Filled 2015-05-23: qty 1

## 2015-05-23 MED ORDER — LACTATED RINGERS IV SOLN
INTRAVENOUS | Status: DC
  Administered 2015-05-23 (×2): via INTRAVENOUS
  Filled 2015-05-23: qty 1000

## 2015-05-23 MED ORDER — BUPIVACAINE-EPINEPHRINE 0.5% -1:200000 IJ SOLN: INTRAMUSCULAR | Status: DC | PRN

## 2015-05-23 MED ORDER — DEXAMETHASONE SODIUM PHOSPHATE 4 MG/ML IJ SOLN
INTRAMUSCULAR | Status: DC | PRN
  Administered 2015-05-23: 10 mg via INTRAVENOUS

## 2015-05-23 MED ORDER — HYDROCODONE-ACETAMINOPHEN 5-325 MG PO TABS
ORAL_TABLET | ORAL | Status: AC
  Filled 2015-05-23: qty 1

## 2015-05-23 MED ORDER — LIDOCAINE HCL (CARDIAC) 20 MG/ML IV SOLN
INTRAVENOUS | Status: DC | PRN
  Administered 2015-05-23: 60 mg via INTRAVENOUS

## 2015-05-23 MED ORDER — PHENAZOPYRIDINE HCL 200 MG PO TABS
ORAL | Status: DC | PRN
  Administered 2015-05-23: 15 mL via INTRAVESICAL

## 2015-05-23 MED ORDER — MIDAZOLAM HCL 2 MG/2ML IJ SOLN
INTRAMUSCULAR | Status: AC
  Filled 2015-05-23: qty 2

## 2015-05-23 MED ORDER — HYDROCODONE-ACETAMINOPHEN 5-325 MG PO TABS: 1.0000 | ORAL_TABLET | Freq: Four times a day (QID) | ORAL | Status: DC | PRN

## 2015-05-23 MED ORDER — CIPROFLOXACIN IN D5W 400 MG/200ML IV SOLN
400.0000 mg | INTRAVENOUS | Status: AC
  Administered 2015-05-23: 400 mg via INTRAVENOUS
  Filled 2015-05-23: qty 200

## 2015-05-23 MED ORDER — PROPOFOL 10 MG/ML IV BOLUS
INTRAVENOUS | Status: DC | PRN
  Administered 2015-05-23: 200 mg via INTRAVENOUS
  Administered 2015-05-23 (×2): 50 mg via INTRAVENOUS

## 2015-05-23 MED ORDER — SCOPOLAMINE 1 MG/3DAYS TD PT72
1.0000 | MEDICATED_PATCH | TRANSDERMAL | Status: DC
  Administered 2015-05-23: 1.5 mg via TRANSDERMAL
  Filled 2015-05-23: qty 1

## 2015-05-23 MED ORDER — PROMETHAZINE HCL 25 MG/ML IJ SOLN
INTRAMUSCULAR | Status: AC
  Filled 2015-05-23: qty 1

## 2015-05-23 MED ORDER — FENTANYL CITRATE (PF) 100 MCG/2ML IJ SOLN
25.0000 ug | INTRAMUSCULAR | Status: DC | PRN
  Administered 2015-05-23: 50 ug via INTRAVENOUS
  Filled 2015-05-23: qty 1

## 2015-05-23 MED ORDER — ONDANSETRON HCL 4 MG/2ML IJ SOLN
INTRAMUSCULAR | Status: DC | PRN
  Administered 2015-05-23: 4 mg via INTRAVENOUS

## 2015-05-23 MED ORDER — CIPROFLOXACIN IN D5W 400 MG/200ML IV SOLN
INTRAVENOUS | Status: AC
  Filled 2015-05-23: qty 200

## 2015-05-23 SURGICAL SUPPLY — 25 items
BAG DRAIN URO-CYSTO SKYTR STRL (DRAIN) ×3 IMPLANT
BAG URINE LEG 19OZ MD ST LTX (BAG) ×3 IMPLANT
CANISTER SUCT LVC 12 LTR MEDI- (MISCELLANEOUS) IMPLANT
CATH FOLEY 2WAY SLVR  5CC 16FR (CATHETERS) ×2
CATH FOLEY 2WAY SLVR 5CC 16FR (CATHETERS) ×1 IMPLANT
CATH ROBINSON RED A/P 16FR (CATHETERS) ×3 IMPLANT
CLOTH BEACON ORANGE TIMEOUT ST (SAFETY) ×3 IMPLANT
ELECT REM PT RETURN 9FT ADLT (ELECTROSURGICAL) ×3
ELECTRODE REM PT RTRN 9FT ADLT (ELECTROSURGICAL) ×1 IMPLANT
GLOVE BIO SURGEON STRL SZ7 (GLOVE) ×3 IMPLANT
GLOVE BIOGEL PI IND STRL 7.5 (GLOVE) ×2 IMPLANT
GLOVE BIOGEL PI INDICATOR 7.5 (GLOVE) ×4
GOWN STRL REUS W/ TWL LRG LVL3 (GOWN DISPOSABLE) ×2 IMPLANT
GOWN STRL REUS W/TWL LRG LVL3 (GOWN DISPOSABLE) ×4
MANIFOLD NEPTUNE II (INSTRUMENTS) ×3 IMPLANT
NDL SAFETY ECLIPSE 18X1.5 (NEEDLE) ×2 IMPLANT
NEEDLE HYPO 18GX1.5 SHARP (NEEDLE) ×4
NEEDLE HYPO 22GX1.5 SAFETY (NEEDLE) IMPLANT
NS IRRIG 500ML POUR BTL (IV SOLUTION) IMPLANT
PACK CYSTO (CUSTOM PROCEDURE TRAY) ×3 IMPLANT
PLUG CATH AND CAP STER (CATHETERS) ×3 IMPLANT
SYR 20CC LL (SYRINGE) IMPLANT
SYR 30ML LL (SYRINGE) ×6 IMPLANT
WATER STERILE IRR 3000ML UROMA (IV SOLUTION) ×3 IMPLANT
WATER STERILE IRR 500ML POUR (IV SOLUTION) ×3 IMPLANT

## 2015-05-23 NOTE — Discharge Instructions (Addendum)
Home with Foley Post Anesthesia Home Care Instructions  Activity: Get plenty of rest for the remainder of the day. A responsible adult should stay with you for 24 hours following the procedure.  For the next 24 hours, DO NOT: -Drive a car -Advertising copywriter -Drink alcoholic beverages -Take any medication unless instructed by your physician -Make any legal decisions or sign important papers.  Meals: Start with liquid foods such as gelatin or soup. Progress to regular foods as tolerated. Avoid greasy, spicy, heavy foods. If nausea and/or vomiting occur, drink only clear liquids until the nausea and/or vomiting subsides. Call your physician if vomiting continues.  Special Instructions/Symptoms: Your throat may feel dry or sore from the anesthesia or the breathing tube placed in your throat during surgery. If this causes discomfort, gargle with warm salt water. The discomfort should disappear within 24 hours.  If you had a scopolamine patch placed behind your ear for the management of post- operative nausea and/or vomiting:  1. The medication in the patch is effective for 72 hours, after which it should be removed.  Wrap patch in a tissue and discard in the trash. Wash hands thoroughly with soap and water. 2. You may remove the patch earlier than 72 hours if you experience unpleasant side effects which may include dry mouth, dizziness or visual disturbances. 3. Avoid touching the patch. Wash your hands with soap and water after contact with the patch.   CYSTOSCOPY HOME CARE INSTRUCTIONS  Activity: Rest for the remainder of the day.  Do not drive or operate equipment today.  You may resume normal activities in one to two days as instructed by your physician.   Meals: Drink plenty of liquids and eat light foods such as gelatin or soup this evening.  You may return to a normal meal plan tomorrow.  Return to Work: You may return to work in one to two days or as instructed by your  physician.  Special Instructions / Symptoms: Call your physician if any of these symptoms occur:   -persistent or heavy bleeding  -bleeding which continues after first few urination  -large blood clots that are difficult to pass  -urine stream diminishes or stops completely  -fever equal to or higher than 101 degrees Farenheit.  -cloudy urine with a strong, foul odor  -severe pain  Females should always wipe from front to back after elimination.  You may feel some burning pain when you urinate.  This should disappear with time.  Applying moist heat to the lower abdomen or a hot tub bath may help relieve the pain. \  Follow-Up / Date of Return Visit to Your Physician:   Call for an appointment to arrange follow-up.  Patient Signature:  ________________________________________________________  Nurse's Signature:  ________________________________________________________ Indwelling Urinary Catheter Care You have been given a flexible tube (catheter) used to drain the bladder. Catheters are often used when a person has difficulty urinating due to blockage, bleeding, infection, or inability to control bladder or bowel movements (incontinence). A catheter requires daily care to prevent infection and blockage. HOME CARE INSTRUCTIONS  Do the following to reduce the risk of infection. Antibiotic medicines cannot prevent infections. Limit the number of bacteria entering your bladder  Wash your hands for 2 minutes with soapy water before and after handling the catheter.  Wash your bottom and the entire catheter twice daily, as well as after each bowel movement. Wash the tip of the penis or just above the vaginal opening with soap and warm water,  rinse, and then wash the rectal area. Always wash from front to back.  When changing from the leg bag to overnight bag or from the overnight bag to leg bag, thoroughly clean the end of the catheter where it connects to the tubing with an alcohol  wipe.  Clean the leg bag and overnight bag daily after use. Replace your drainage bags weekly.  Always keep the tubing and bag below the level of your bladder. This allows your urine to drain properly. Lifting the bag or tubing above the level of your bladder will cause dirty urine to flow back into your bladder. If you must briefly lift the bag higher than your bladder, pinch the catheter or tubing to prevent backflow.  Drink enough water and fluids to keep your urine clear or pale yellow, or as directed by your caregiver. This will flush bacteria out of the bladder. Protect tissues from injury  Attach the catheter to your leg so there is no tension on the catheter. Use adhesive tape or a leg strap. If you are using adhesive tape, remove any sticky residue left behind by the previous tape you used.  Place your leg bag on your lower leg. Fasten the straps securely and comfortably.  Do not remove the catheter yourself unless you have been instructed how to do so. Keep the urinary pathway open  Check throughout the day to be sure your catheter is working and urine is draining freely. Make sure the tubing does not become kinked.  Do not let the drainage bag overfill. SEEK IMMEDIATE MEDICAL CARE IF:   The catheter becomes blocked. Urine is not draining.  Urine is leaking.  You have any pain.  You have a fever. Document Released: 09/09/2005 Document Revised: 08/26/2012 Document Reviewed: 02/08/2010 [facility] Patient Information 2015 [place], [place]. This information is not intended to replace advice given to you by your health care provider. Make sure you discuss any questions you have with your health care provider.

## 2015-05-23 NOTE — Anesthesia Procedure Notes (Signed)
Procedure Name: LMA Insertion Date/Time: 05/23/2015 11:22 AM Performed by: Tyrone Nine Pre-anesthesia Checklist: Patient identified, Timeout performed, Emergency Drugs available, Suction available and Patient being monitored Patient Re-evaluated:Patient Re-evaluated prior to inductionOxygen Delivery Method: Circle system utilized Preoxygenation: Pre-oxygenation with 100% oxygen Intubation Type: IV induction Ventilation: Mask ventilation without difficulty LMA: LMA inserted LMA Size: 4.0 Number of attempts: 1 Placement Confirmation: breath sounds checked- equal and bilateral and positive ETCO2 Tube secured with: Tape Dental Injury: Teeth and Oropharynx as per pre-operative assessment

## 2015-05-23 NOTE — Op Note (Signed)
[name] is a 49 y.o.   05/23/2015  General  Preop diagnosis: Female pelvic pain. Rule out interstitial cystitis.  Postop diagnosis: Same  Procedure done: Cystoscopy, hydraulic bladder distention, bladder biopsy.  Surgeon: Wendie Simmer. [name]  Anesthesia: Gen.  Indication: Patient is a 49 years old female who has been complaining of right-sided abdominal pain for several months. The pain radiates to the suprapubic area. She voids only small amount of urine other time. She was treated with nitrofurantoin for Escherichia coli UTI. Cystoscopy in the office showed a reddened bladder mucosa. She is scheduled today for cystoscopy hydraulic bladder distention and bladder biopsy.   Procedure: The patient was identified by her wrist band and proper timeout was taken.  Under general anesthesia she was prepped and draped and placed in the dorsolithotomy position. A panendoscope was inserted in the bladder.  There is no stone or tumor in the bladder. The bladder mucosa at the base is reddened. The bladder was then distended under 36 cm of water pressure for about 10 minutes. There was no evidence of submucosal hemorrhage. The bladder capacity is about 1000 mL.  With a biopsy forceps random biopsy of the reddened areas of the mucosa was done. Hemostasis was secured with electrocautery. There was no evidence of bleeding at the end of the procedure. The cystoscope was then removed. A #16 French Foley catheter was inserted in the bladder. 400 mg of Pyridium in 15 mL of 0.5% Marcaine were instilled in the bladder.  Patient tolerated the procedure well and left the OR in satisfactory condition to postanesthesia care unit.  EBL: Minimal

## 2015-05-23 NOTE — Progress Notes (Signed)
Fax sent to Dr. Ival Bible, [facility] cardiology in [place].  Spoke w Aurther Loft and asked her to complete the prescription for implanted cardiac device form and fax back stat if possible.

## 2015-05-23 NOTE — Anesthesia Preprocedure Evaluation (Addendum)
Anesthesia Evaluation  Patient identified by MRN, date of birth, ID band Patient awake    Reviewed: Allergy & Precautions, H&P , NPO status , Patient's Chart, lab work & pertinent test results  History of Anesthesia Complications (+) PONV and history of anesthetic complications  Airway Mallampati: II  TM Distance: >3 FB Neck ROM: full    Dental no notable dental hx. (+) Teeth Intact, Dental Advisory Given,    Pulmonary asthma , sleep apnea , COPDCurrent Smoker,  breath sounds clear to auscultation  Pulmonary exam normal       Cardiovascular hypertension, Pt. on medications Normal cardiovascular exam+ dysrhythmias + pacemaker (sinus node dysfunction) Rhythm:regular Rate:Normal     Neuro/Psych PSYCHIATRIC DISORDERS CVA    GI/Hepatic Neg liver ROS, hiatal hernia, GERD-  ,  Endo/Other  diabetes, Poorly Controlled, Type 2, Insulin Dependent  Renal/GU negative Renal ROS     Musculoskeletal  (+) Arthritis -,   Abdominal   Peds  Hematology negative hematology ROS (+)   Anesthesia Other Findings Cardiac clearance in chart, low risk for periop cardiac event, takes plavix for hx of stroke, pacemaker for sinus node dysfunction, will have magnet in room  Clean cath 2012  Reproductive/Obstetrics negative OB ROS                          Anesthesia Physical Anesthesia Plan  ASA: III  Anesthesia Plan: General   Post-op Pain Management:    Induction: Intravenous  Airway Management Planned: LMA  Additional Equipment:   Intra-op Plan:   Post-operative Plan:   Informed Consent: I have reviewed the patients History and Physical, chart, labs and discussed the procedure including the risks, benefits and alternatives for the proposed anesthesia with the patient or authorized representative who has indicated his/her understanding and acceptance.     Plan Discussed with: Anesthesiologist, CRNA and  Surgeon  Anesthesia Plan Comments: (Pacemaker per Cardiology prescription in chart, LMA given COPD and smoking hx, multi antemetic therapy)       Anesthesia Quick Evaluation

## 2015-05-23 NOTE — Transfer of Care (Signed)
Immediate Anesthesia Transfer of Care Note  Patient: [name]  Procedure(s) Performed: Procedure(s): CYSTOSCOPY/BIOPSY/HYDRODISTENSION (N/A)  Patient Location: PACU  Anesthesia Type:General  Level of Consciousness: awake, alert , oriented and patient cooperative  Airway & Oxygen Therapy: Patient Spontanous Breathing and Patient connected to nasal cannula oxygen  Post-op Assessment: Report given to RN and Post -op Vital signs reviewed and stable  Post vital signs: Reviewed and stable  Last Vitals:  Filed Vitals:   05/23/15 0955  BP: 138/98  Pulse: 65  Temp: 36.8 C  Resp: 16    Complications: No apparent anesthesia complications

## 2015-05-23 NOTE — Anesthesia Postprocedure Evaluation (Signed)
  Anesthesia Post-op Note  Patient: [name]  Procedure(s) Performed: Procedure(s) (LRB): CYSTOSCOPY/BIOPSY/HYDRODISTENSION (N/A)  Patient Location: PACU  Anesthesia Type: General  Level of Consciousness: awake and alert   Airway and Oxygen Therapy: Patient Spontanous Breathing  Post-op Pain: mild  Post-op Assessment: Post-op Vital signs reviewed, Patient's Cardiovascular Status Stable, Respiratory Function Stable, Patent Airway and No signs of Nausea or vomiting  Last Vitals:  Filed Vitals:   05/23/15 0955  BP: 138/98  Pulse: 65  Temp: 36.8 C  Resp: 16    Post-op Vital Signs: stable   Complications: No apparent anesthesia complications

## 2015-05-24 ENCOUNTER — Encounter (HOSPITAL_BASED_OUTPATIENT_CLINIC_OR_DEPARTMENT_OTHER): Payer: Self-pay | Admitting: Urology

## 2015-05-24 NOTE — Progress Notes (Signed)
Charted on wrong record  Unable to reach the patient a message was left

## 2015-06-01 ENCOUNTER — Ambulatory Visit: Payer: Medicaid Other | Admitting: Physical Therapy

## 2015-06-12 ENCOUNTER — Ambulatory Visit: Payer: Medicaid Other | Attending: Urology | Admitting: Physical Therapy

## 2015-06-30 ENCOUNTER — Ambulatory Visit (INDEPENDENT_AMBULATORY_CARE_PROVIDER_SITE_OTHER): Payer: Medicaid Other | Admitting: Family Medicine

## 2015-06-30 ENCOUNTER — Encounter: Payer: Self-pay | Admitting: Family Medicine

## 2015-06-30 VITALS — BP 157/98 | HR 64 | Temp 98.1°F | Ht 67.0 in | Wt 203.8 lb

## 2015-06-30 DIAGNOSIS — M25561 Pain in right knee: Secondary | ICD-10-CM | POA: Diagnosis not present

## 2015-06-30 DIAGNOSIS — I10 Essential (primary) hypertension: Secondary | ICD-10-CM | POA: Diagnosis not present

## 2015-06-30 DIAGNOSIS — E1165 Type 2 diabetes mellitus with hyperglycemia: Secondary | ICD-10-CM

## 2015-06-30 DIAGNOSIS — E782 Mixed hyperlipidemia: Secondary | ICD-10-CM | POA: Diagnosis not present

## 2015-06-30 DIAGNOSIS — M25562 Pain in left knee: Secondary | ICD-10-CM | POA: Diagnosis not present

## 2015-06-30 DIAGNOSIS — F329 Major depressive disorder, single episode, unspecified: Secondary | ICD-10-CM

## 2015-06-30 DIAGNOSIS — Z23 Encounter for immunization: Secondary | ICD-10-CM | POA: Diagnosis not present

## 2015-06-30 DIAGNOSIS — G4733 Obstructive sleep apnea (adult) (pediatric): Secondary | ICD-10-CM

## 2015-06-30 DIAGNOSIS — J449 Chronic obstructive pulmonary disease, unspecified: Secondary | ICD-10-CM

## 2015-06-30 DIAGNOSIS — E118 Type 2 diabetes mellitus with unspecified complications: Principal | ICD-10-CM

## 2015-06-30 DIAGNOSIS — F32A Depression, unspecified: Secondary | ICD-10-CM

## 2015-06-30 LAB — POCT GLYCOSYLATED HEMOGLOBIN (HGB A1C): Hemoglobin A1C: 13.6

## 2015-06-30 MED ORDER — METFORMIN HCL 1000 MG PO TABS: 1000.0000 mg | ORAL_TABLET | Freq: Two times a day (BID) | ORAL | Status: DC

## 2015-06-30 MED ORDER — FLUTICASONE-SALMETEROL 250-50 MCG/DOSE IN AEPB: 2.0000 | INHALATION_SPRAY | Freq: Two times a day (BID) | RESPIRATORY_TRACT | Status: DC

## 2015-06-30 MED ORDER — BUPROPION HCL ER (XL) 150 MG PO TB24: 150.0000 mg | ORAL_TABLET | Freq: Every day | ORAL | Status: DC

## 2015-06-30 MED ORDER — IBUPROFEN 800 MG PO TABS: 800.0000 mg | ORAL_TABLET | Freq: Three times a day (TID) | ORAL | Status: DC | PRN

## 2015-06-30 MED ORDER — TIZANIDINE HCL 4 MG PO TABS: 12.0000 mg | ORAL_TABLET | Freq: Every day | ORAL | Status: DC

## 2015-07-03 NOTE — Progress Notes (Signed)
BP 157/98 mmHg  Pulse 64  Temp(Src) 98.1 F (36.7 C) (Oral)  Ht  (1.702 m)  Wt 203 lb 12.8 oz (92.443 kg)  BMI 31.91 kg/m2  SpO2 95%   Subjective:    Patient ID: [name], female    DOB: December 23, 1965, 49 y.o.   MRN: [id number]  HPI: [name] is a 49 y.o. female presenting on 06/30/2015 for Diabetes and Knee Pain   HPI Diabetes recheck Patient presents today for a diabetes recheck. She has been out of a lot of her medications for some time. Her A1c today is 13.6. Previously she was on metformin 1000 twice a day and Levemir 80 at bedtime and short acting 15 3 times a day with meals. She has had a little bit of her Levemir other than that she's been out of the rest. Patient denies headaches, blurred vision, chest pains, shortness of breath, or weakness. Denies any side effects from medication and is content with current medication.  Knee pain Patient presents today with bilateral knee pain that she's been having for quite some time. She has seen an orthopedic physician for this before discussed surgery and would like to go back and see them again. She does have some grinding in her knee And some popping and catching of her knee when she moves. No erythema or warmth.  Depression She has had depression off and on for her life. She would like a medication to help with this. She admits to satisfy episodes but denies any suicidal ideation. She has been on Wellbutrin before and liked it. Sleep Issues:  Yes Interests and hobbies decrease: Yes Guilt:  No Energy Loss:  Yes Concentration difficulties:  Yes Appetite changes:  Yes Psychomotor Retardation:  No Suicidal Ideations:  No  Hypertension Blood pressures elevated because patient has been out of medications. We will restart the medications. Patient denies headaches, blurred vision, chest pains, shortness of breath, or weakness. Denies any side effects from medication and is content with current medication.  COPD Patient  has increased shortness of breath and coughing at night despite being on Advair and Spiriva and a rescue inhaler. Her symptoms are worse at night. She still also smokes a pack per day and is finally ready to quit because the way she is feeling.  Relevant past medical, surgical, family and social history reviewed and updated as indicated. Interim medical history since our last visit reviewed. Allergies and medications reviewed and updated.  Review of Systems  Constitutional: Positive for fatigue. Negative for fever and chills.  HENT: Negative for congestion, ear discharge and ear pain.   Eyes: Negative for redness and visual disturbance.  Respiratory: Positive for cough, shortness of breath and wheezing. Negative for chest tightness.   Cardiovascular: Negative for chest pain and leg swelling.  Genitourinary: Negative for dysuria and difficulty urinating.  Musculoskeletal: Positive for arthralgias. Negative for back pain and gait problem.  Skin: Negative for rash.  Neurological: Negative for light-headedness and headaches.  Psychiatric/Behavioral: Positive for sleep disturbance, dysphoric mood and decreased concentration. Negative for suicidal ideas, behavioral problems, self-injury and agitation. The patient is nervous/anxious.   All other systems reviewed and are negative.   Per HPI unless specifically indicated above     Medication List       This list is accurate as of: 06/30/15 11:59 PM.  Always use your most recent med list.               albuterol (2.5 MG/3ML)  0.083% nebulizer solution  Commonly known as:  PROVENTIL  Take 3 mLs (2.5 mg total) by nebulization every 6 (six) hours as needed for wheezing or shortness of breath.     aspirin EC 81 MG tablet  Take 81 mg by mouth daily.     buPROPion 150 MG 24 hr tablet  Commonly known as:  WELLBUTRIN XL  Take 1 tablet (150 mg total) by mouth daily.     carvedilol 12.5 MG tablet  Commonly known as:  COREG  Take 1 tablet (12.5  mg total) by mouth 2 (two) times daily with a meal.     clopidogrel 75 MG tablet  Commonly known as:  PLAVIX  Take 1 tablet (75 mg total) by mouth daily.     diclofenac sodium 1 % Gel  Commonly known as:  VOLTAREN  Apply 4 g topically 4 (four) times daily.     diphenoxylate-atropine 2.5-0.025 MG tablet  Commonly known as:  LOMOTIL  Take 2 tablets by mouth 4 (four) times daily as needed for diarrhea or loose stools.     Fluticasone-Salmeterol 250-50 MCG/DOSE Aepb  Commonly known as:  ADVAIR DISKUS  Inhale 2 puffs into the lungs 2 (two) times daily.     furosemide 40 MG tablet  Commonly known as:  LASIX  Take 1 tablet (40 mg total) by mouth daily.     ibuprofen 800 MG tablet  Commonly known as:  ADVIL,MOTRIN  Take 1 tablet (800 mg total) by mouth every 8 (eight) hours as needed.     insulin aspart 100 UNIT/ML injection  Commonly known as:  NOVOLOG  Inject 15 Units into the skin 3 (three) times daily before meals.     insulin detemir 100 UNIT/ML injection  Commonly known as:  LEVEMIR  Inject 0.8 mLs (80 Units total) into the skin at bedtime.     metFORMIN 1000 MG tablet  Commonly known as:  GLUCOPHAGE  Take 1 tablet (1,000 mg total) by mouth 2 (two) times daily with a meal.     ondansetron 8 MG disintegrating tablet  Commonly known as:  ZOFRAN-ODT  Take 1 tablet (8 mg total) by mouth every 6 (six) hours as needed for nausea or vomiting.     pantoprazole 40 MG tablet  Commonly known as:  PROTONIX  Take 1 tablet (40 mg total) by mouth daily. For stomach     potassium chloride SA 20 MEQ tablet  Commonly known as:  K-DUR,KLOR-CON  Take 1 tablet (20 mEq total) by mouth daily.     simvastatin 20 MG tablet  Commonly known as:  ZOCOR  Take 1 tablet (20 mg total) by mouth daily after supper.     tiotropium 18 MCG inhalation capsule  Commonly known as:  SPIRIVA HANDIHALER  Place 1 capsule (18 mcg total) into inhaler and inhale daily.     tiZANidine 4 MG tablet  Commonly  known as:  ZANAFLEX  Take 3 tablets (12 mg total) by mouth at bedtime.     Vitamin D (Ergocalciferol) 50000 UNITS Caps capsule  Commonly known as:  DRISDOL  TAKE 1 CAPSULE BY MOUTH TWICE A WEEK FOR 2 MONTHS           Objective:    BP 157/98 mmHg  Pulse 64  Temp(Src) 98.1 F (36.7 C) (Oral)  Ht  (1.702 m)  Wt 203 lb 12.8 oz (92.443 kg)  BMI 31.91 kg/m2  SpO2 95%  Wt Readings from Last 3 Encounters:  06/30/15 203  lb 12.8 oz (92.443 kg)  05/23/15 203 lb 8 oz (92.307 kg)  04/01/15 211 lb (95.709 kg)    Physical Exam  Constitutional: She is oriented to person, place, and time. She appears well-developed and well-nourished. No distress.  HENT:  Right Ear: External ear normal.  Left Ear: External ear normal.  Mouth/Throat: Oropharynx is clear and moist. No oropharyngeal exudate.  Eyes: Conjunctivae and EOM are normal. Pupils are equal, round, and reactive to light.  Neck: Neck supple. No thyromegaly present.  Cardiovascular: Normal rate, regular rhythm, normal heart sounds and intact distal pulses.   No murmur heard. Pulmonary/Chest: Effort normal. No respiratory distress. She has wheezes. She has no rales. She exhibits no tenderness.  Musculoskeletal: Normal range of motion. She exhibits no edema or tenderness.  Bilateral knee pain, patient has anterior knee pain, no MCL or LCL laxity  Lymphadenopathy:    She has no cervical adenopathy.  Neurological: She is alert and oriented to person, place, and time. Coordination normal.  Skin: Skin is warm and dry. No rash noted. She is not diaphoretic.  Psychiatric: Her speech is normal and behavior is normal. Thought content normal. Her mood appears anxious. She exhibits a depressed mood.  Vitals reviewed.   Results for orders placed or performed in visit on 06/30/15  POCT glycosylated hemoglobin (Hb A1C)  Result Value Ref Range   Hemoglobin A1C 13.6       Assessment & Plan:   Problem List Items Addressed This Visit       Cardiovascular and Mediastinum   HYPERTENSION, BENIGN ESSENTIAL    Blood pressure elevated, she has been off her medications, we will have her restart her Coreg.        Respiratory   COPD mixed type (HCC)   Relevant Medications   Fluticasone-Salmeterol (ADVAIR DISKUS) 250-50 MCG/DOSE AEPB   Other Relevant Orders   Pulse oximetry, overnight   OSA (obstructive sleep apnea)    She has symptoms of sleep apnea and we'll refer for sleep study to be evaluated.      Relevant Orders   Ambulatory referral to Sleep Studies     Endocrine   Diabetes mellitus type 2, uncontrolled (HCC) - Primary    Patient has been off medications and A1c is very elevated. We will restart insulins and metformin and recheck.      Relevant Medications   metFORMIN (GLUCOPHAGE) 1000 MG tablet     Other   MIXED HYPERLIPIDEMIA    Recheck lipid panel      Depression    Patient has had depression for some time and would like to try Wellbutrin because it also helps with smoking cessation.      Relevant Medications   buPROPion (WELLBUTRIN XL) 150 MG 24 hr tablet    Other Visit Diagnoses    Arthralgia of both knees        Patient has had arthritic issues for some time and these I would like to go see orthopedic physician.    Relevant Medications    tiZANidine (ZANAFLEX) 4 MG tablet    ibuprofen (ADVIL,MOTRIN) 800 MG tablet    Other Relevant Orders    Ambulatory referral to Orthopedic Surgery    Encounter for immunization            Follow up plan: Return in about 4 weeks (around 07/28/2015), or if symptoms worsen or fail to improve, for f/u anxiety.  Arville Care, MD [facility] 06/30/2015, 4:07 AM

## 2015-07-04 DIAGNOSIS — J449 Chronic obstructive pulmonary disease, unspecified: Secondary | ICD-10-CM | POA: Insufficient documentation

## 2015-07-04 DIAGNOSIS — G4733 Obstructive sleep apnea (adult) (pediatric): Secondary | ICD-10-CM | POA: Insufficient documentation

## 2015-07-04 DIAGNOSIS — F329 Major depressive disorder, single episode, unspecified: Secondary | ICD-10-CM | POA: Insufficient documentation

## 2015-07-04 DIAGNOSIS — F32A Depression, unspecified: Secondary | ICD-10-CM | POA: Insufficient documentation

## 2015-07-04 NOTE — Assessment & Plan Note (Signed)
Recheck lipid panel 

## 2015-07-04 NOTE — Assessment & Plan Note (Signed)
She has symptoms of sleep apnea and we'll refer for sleep study to be evaluated.

## 2015-07-04 NOTE — Assessment & Plan Note (Signed)
Patient has had depression for some time and would like to try Wellbutrin because it also helps with smoking cessation.

## 2015-07-04 NOTE — Assessment & Plan Note (Signed)
Blood pressure elevated, she has been off her medications, we will have her restart her Coreg.

## 2015-07-04 NOTE — Assessment & Plan Note (Signed)
Patient has been off medications and A1c is very elevated. We will restart insulins and metformin and recheck.

## 2015-07-05 NOTE — Progress Notes (Signed)
Faxed order and demos to [facility] for overnight oximetry

## 2015-07-05 NOTE — Addendum Note (Signed)
Addended by: Gwenith Daily on: 07/05/2015 12:40 PM   Modules accepted: Orders

## 2015-07-07 ENCOUNTER — Other Ambulatory Visit (INDEPENDENT_AMBULATORY_CARE_PROVIDER_SITE_OTHER): Payer: Medicaid Other

## 2015-07-07 ENCOUNTER — Other Ambulatory Visit: Payer: Self-pay

## 2015-07-07 DIAGNOSIS — E782 Mixed hyperlipidemia: Secondary | ICD-10-CM

## 2015-07-07 DIAGNOSIS — I10 Essential (primary) hypertension: Secondary | ICD-10-CM

## 2015-07-07 DIAGNOSIS — E1169 Type 2 diabetes mellitus with other specified complication: Secondary | ICD-10-CM

## 2015-07-07 DIAGNOSIS — E1165 Type 2 diabetes mellitus with hyperglycemia: Secondary | ICD-10-CM

## 2015-07-07 DIAGNOSIS — E118 Type 2 diabetes mellitus with unspecified complications: Principal | ICD-10-CM

## 2015-07-07 DIAGNOSIS — Z0289 Encounter for other administrative examinations: Secondary | ICD-10-CM

## 2015-07-07 DIAGNOSIS — IMO0002 Reserved for concepts with insufficient information to code with codable children: Secondary | ICD-10-CM

## 2015-07-07 NOTE — Progress Notes (Signed)
Lab only 

## 2015-07-08 LAB — THYROID PANEL WITH TSH
Free Thyroxine Index: 1.8 (ref 1.2–4.9)
T3 Uptake Ratio: 29 % (ref 24–39)
T4 TOTAL: 6.3 ug/dL (ref 4.5–12.0)
TSH: 0.844 u[IU]/mL (ref 0.450–4.500)

## 2015-07-08 LAB — LIPID PANEL
CHOLESTEROL TOTAL: 276 mg/dL — AB (ref 100–199)
Chol/HDL Ratio: 6.6 ratio units — ABNORMAL HIGH (ref 0.0–4.4)
HDL: 42 mg/dL (ref >39)
LDL CALC: 162 mg/dL — AB (ref 0–99)
Triglycerides: 362 mg/dL — ABNORMAL HIGH (ref 0–149)
VLDL CHOLESTEROL CAL: 72 mg/dL — AB (ref 5–40)

## 2015-07-08 LAB — CMP14+EGFR
A/G RATIO: 1.9 (ref 1.1–2.5)
ALBUMIN: 4.4 g/dL (ref 3.5–5.5)
ALT: 16 IU/L (ref 0–32)
AST: 18 IU/L (ref 0–40)
Alkaline Phosphatase: 115 IU/L (ref 39–117)
BUN / CREAT RATIO: 18 (ref 9–23)
BUN: 17 mg/dL (ref 6–24)
Bilirubin Total: 0.2 mg/dL (ref 0.0–1.2)
CALCIUM: 9.7 mg/dL (ref 8.7–10.2)
CO2: 23 mmol/L (ref 18–29)
CREATININE: 0.92 mg/dL (ref 0.57–1.00)
Chloride: 98 mmol/L (ref 97–108)
GFR calc non Af Amer: 73 mL/min/{1.73_m2} (ref >59)
GFR, EST AFRICAN AMERICAN: 85 mL/min/{1.73_m2} (ref >59)
GLOBULIN, TOTAL: 2.3 g/dL (ref 1.5–4.5)
Glucose: 165 mg/dL — ABNORMAL HIGH (ref 65–99)
Potassium: 4.2 mmol/L (ref 3.5–5.2)
Sodium: 141 mmol/L (ref 134–144)
TOTAL PROTEIN: 6.7 g/dL (ref 6.0–8.5)

## 2015-07-11 ENCOUNTER — Other Ambulatory Visit: Payer: Self-pay | Admitting: *Deleted

## 2015-07-11 MED ORDER — SIMVASTATIN 40 MG PO TABS: 40.0000 mg | ORAL_TABLET | Freq: Every day | ORAL | Status: DC

## 2015-07-18 ENCOUNTER — Telehealth: Payer: Self-pay | Admitting: Family Medicine

## 2015-07-18 NOTE — Telephone Encounter (Signed)
Called and got results, they are faxing here today, will give to Dr. Louanne Skye

## 2015-07-18 NOTE — Telephone Encounter (Signed)
Patient's results are not available from [facility], please check on this, thank you!

## 2015-07-19 ENCOUNTER — Other Ambulatory Visit: Payer: Self-pay | Admitting: Family Medicine

## 2015-07-20 ENCOUNTER — Institutional Professional Consult (permissible substitution): Payer: Medicaid Other | Admitting: Internal Medicine

## 2015-07-24 ENCOUNTER — Telehealth: Payer: Self-pay | Admitting: Family Medicine

## 2015-07-26 NOTE — Telephone Encounter (Signed)
We have sent in paperwork and now it is on meropenem about how fast they can get it to her. Also declines setting where ever she wants to. Arville Care, MD [facility] 07/26/2015, 9:25 PM

## 2015-07-26 NOTE — Telephone Encounter (Signed)
Per Dr. Louanne Skye patient will need oxygen at bedtime, 2 liters.  Paperwork has been sent to you for this for [employer], [facility].  Patient was informed of this information, however, she would rather this be set up through [place] because they have all of her information.  Is this something we can set up?

## 2015-07-26 NOTE — Telephone Encounter (Signed)
Pulse ox shows positive for multiple desaturation events overnight. & Paper to get her 2 L of oxygen at bedtime. Would like her to reconsider whether or not she wants to do a sleep study because of the number of events that she was having. Arville Care, MD [facility] 07/26/2015, 4:25 PM

## 2015-07-27 ENCOUNTER — Telehealth: Payer: Self-pay | Admitting: Family Medicine

## 2015-07-27 NOTE — Telephone Encounter (Signed)
Orders faxed to [facility] for nocturnal o2

## 2015-07-27 NOTE — Telephone Encounter (Signed)
Pt aware that we are using [facility] and they will be contacting her

## 2015-07-31 ENCOUNTER — Ambulatory Visit: Payer: Medicaid Other | Admitting: Family Medicine

## 2015-07-31 NOTE — Telephone Encounter (Signed)
error 

## 2015-08-02 ENCOUNTER — Institutional Professional Consult (permissible substitution): Payer: Medicaid Other | Admitting: Internal Medicine

## 2015-08-02 ENCOUNTER — Ambulatory Visit (INDEPENDENT_AMBULATORY_CARE_PROVIDER_SITE_OTHER): Payer: Medicaid Other | Admitting: Family Medicine

## 2015-08-02 ENCOUNTER — Encounter: Payer: Self-pay | Admitting: Family Medicine

## 2015-08-02 VITALS — BP 176/94 | HR 66 | Temp 98.6°F | Ht 67.0 in | Wt 217.2 lb

## 2015-08-02 DIAGNOSIS — E1142 Type 2 diabetes mellitus with diabetic polyneuropathy: Secondary | ICD-10-CM

## 2015-08-02 DIAGNOSIS — E1140 Type 2 diabetes mellitus with diabetic neuropathy, unspecified: Secondary | ICD-10-CM | POA: Insufficient documentation

## 2015-08-02 DIAGNOSIS — F329 Major depressive disorder, single episode, unspecified: Secondary | ICD-10-CM | POA: Diagnosis not present

## 2015-08-02 DIAGNOSIS — I10 Essential (primary) hypertension: Secondary | ICD-10-CM | POA: Diagnosis not present

## 2015-08-02 DIAGNOSIS — J449 Chronic obstructive pulmonary disease, unspecified: Secondary | ICD-10-CM

## 2015-08-02 DIAGNOSIS — F32A Depression, unspecified: Secondary | ICD-10-CM

## 2015-08-02 MED ORDER — GABAPENTIN 300 MG PO CAPS: 300.0000 mg | ORAL_CAPSULE | Freq: Three times a day (TID) | ORAL | Status: DC

## 2015-08-02 MED ORDER — ONDANSETRON 8 MG PO TBDP: 8.0000 mg | ORAL_TABLET | Freq: Four times a day (QID) | ORAL | Status: DC | PRN

## 2015-08-02 MED ORDER — LISINOPRIL-HYDROCHLOROTHIAZIDE 10-12.5 MG PO TABS: 1.0000 | ORAL_TABLET | Freq: Every day | ORAL | Status: DC

## 2015-08-02 MED ORDER — DIPHENOXYLATE-ATROPINE 2.5-0.025 MG PO TABS: 2.0000 | ORAL_TABLET | Freq: Four times a day (QID) | ORAL | Status: DC | PRN

## 2015-08-02 MED ORDER — SERTRALINE HCL 100 MG PO TABS: 100.0000 mg | ORAL_TABLET | Freq: Every day | ORAL | Status: DC

## 2015-08-02 NOTE — Progress Notes (Signed)
BP 176/94 mmHg  Pulse 66  Temp(Src) 98.6 F (37 C) (Oral)  Ht 5' 7"  (1.702 m)  Wt 217 lb 3.2 oz (98.521 kg)  BMI 34.01 kg/m2   Subjective:    Patient ID: [name], female    DOB: 1966/03/20, 49 y.o.   MRN: [id number]  HPI: [name] is a 49 y.o. female presenting on 08/02/2015 for Depression; Knee Pain; and Foot pain/numbness/tingling/burning   HPI Depression recheck Patient has been feeling less anxious and less depressed and looks happier here today in the room. She not like the way the Wellbutrin makes her feel though. She feels like she has been eating a lot more since she's been on the Wellbutrin and she feels like it causes her to have some strange odd or weird thoughts. She would like to try something else. She denies suicidal ideation or thoughts of hurting herself.  COPD Patient presents today because she is still having significant shortness breath at least 3 times a week. She is having coughing spells and wheezing spells. Her cough is dry and nonproductive. She was also diagnosed with hypoxia overnight and given oxygen 2-3 L to use overnight. She occasionally uses a it during the day as well. She denies any fevers or chills. She is still currently using her Spiriva and Advair and albuterol rescue inhaler. She doesn't feel like she necessarily getting worse over this period is just this is where she's been for quite some time.  Burning pain in feet Patient has been having burning and tingling pain in feet that feels like when leg falls asleep. It is in both feet and she feels like it is been worsening over the past month. She has had it maybe for a total of 2 or 3 months. She has never been on anything for her diabetic neuropathy before but does know what it is. She denies any sores or ulcerations or rashes on her feet.  Hypertension Patient has been having elevated blood pressure off and on. Today her blood pressure is quite elevated at 176/94. She denies any  headaches or chest pain. She is currently on Coreg 12.5 mg. She denies any visual disturbances or focal numbness or weakness  Relevant past medical, surgical, family and social history reviewed and updated as indicated. Interim medical history since our last visit reviewed. Allergies and medications reviewed and updated.  Review of Systems  Constitutional: Negative for fever and chills.  HENT: Negative for congestion, ear discharge, ear pain, postnasal drip, rhinorrhea, sinus pressure, sneezing and sore throat.   Eyes: Negative for pain, redness and visual disturbance.  Respiratory: Positive for cough, chest tightness, shortness of breath and wheezing.   Cardiovascular: Negative for chest pain and leg swelling.  Genitourinary: Negative for dysuria and difficulty urinating.  Musculoskeletal: Negative for back pain and gait problem.  Skin: Negative for rash.  Neurological: Positive for numbness (and tingling and burning pain in feet). Negative for dizziness, weakness, light-headedness and headaches.  Psychiatric/Behavioral: Positive for dysphoric mood (Much improved) and agitation. Negative for suicidal ideas, behavioral problems, sleep disturbance and self-injury. The patient is not nervous/anxious and is not hyperactive.   All other systems reviewed and are negative.   Per HPI unless specifically indicated above     Medication List       This list is accurate as of: 08/02/15  1:43 PM.  Always use your most recent med list.  albuterol (2.5 MG/3ML) 0.083% nebulizer solution  Commonly known as:  PROVENTIL  Take 3 mLs (2.5 mg total) by nebulization every 6 (six) hours as needed for wheezing or shortness of breath.     aspirin EC 81 MG tablet  Take 81 mg by mouth daily.     carvedilol 12.5 MG tablet  Commonly known as:  COREG  Take 1 tablet (12.5 mg total) by mouth 2 (two) times daily with a meal.     clopidogrel 75 MG tablet  Commonly known as:  PLAVIX  Take 1  tablet (75 mg total) by mouth daily.     diclofenac sodium 1 % Gel  Commonly known as:  VOLTAREN  Apply 4 g topically 4 (four) times daily.     diphenoxylate-atropine 2.5-0.025 MG tablet  Commonly known as:  LOMOTIL  Take 2 tablets by mouth 4 (four) times daily as needed for diarrhea or loose stools.     Fluticasone-Salmeterol 250-50 MCG/DOSE Aepb  Commonly known as:  ADVAIR DISKUS  Inhale 2 puffs into the lungs 2 (two) times daily.     furosemide 40 MG tablet  Commonly known as:  LASIX  Take 1 tablet (40 mg total) by mouth daily.     gabapentin 300 MG capsule  Commonly known as:  NEURONTIN  Take 1 capsule (300 mg total) by mouth 3 (three) times daily.     ibuprofen 800 MG tablet  Commonly known as:  ADVIL,MOTRIN  Take 1 tablet (800 mg total) by mouth every 8 (eight) hours as needed.     insulin aspart 100 UNIT/ML injection  Commonly known as:  NOVOLOG  Inject 15 Units into the skin 3 (three) times daily before meals.     insulin detemir 100 UNIT/ML injection  Commonly known as:  LEVEMIR  Inject 0.8 mLs (80 Units total) into the skin at bedtime.     lisinopril-hydrochlorothiazide 10-12.5 MG tablet  Commonly known as:  PRINZIDE,ZESTORETIC  Take 1 tablet by mouth daily.     metFORMIN 1000 MG tablet  Commonly known as:  GLUCOPHAGE  Take 1 tablet (1,000 mg total) by mouth 2 (two) times daily with a meal.     ondansetron 8 MG disintegrating tablet  Commonly known as:  ZOFRAN-ODT  Take 1 tablet (8 mg total) by mouth every 6 (six) hours as needed for nausea or vomiting.     pantoprazole 40 MG tablet  Commonly known as:  PROTONIX  TAKE 1 TABLET BY MOUTH DAILY FOR STOMACH     potassium chloride SA 20 MEQ tablet  Commonly known as:  K-DUR,KLOR-CON  Take 1 tablet (20 mEq total) by mouth daily.     sertraline 100 MG tablet  Commonly known as:  ZOLOFT  Take 1 tablet (100 mg total) by mouth daily.     simvastatin 40 MG tablet  Commonly known as:  ZOCOR  Take 1 tablet (40  mg total) by mouth at bedtime.     tiotropium 18 MCG inhalation capsule  Commonly known as:  SPIRIVA HANDIHALER  Place 1 capsule (18 mcg total) into inhaler and inhale daily.     tiZANidine 4 MG tablet  Commonly known as:  ZANAFLEX  Take 3 tablets (12 mg total) by mouth at bedtime.     Vitamin D (Ergocalciferol) 50000 UNITS Caps capsule  Commonly known as:  DRISDOL  TAKE 1 CAPSULE BY MOUTH TWICE A WEEK FOR 2 MONTHS           Objective:  BP 176/94 mmHg  Pulse 66  Temp(Src) 98.6 F (37 C) (Oral)  Ht 5' 7"  (1.702 m)  Wt 217 lb 3.2 oz (98.521 kg)  BMI 34.01 kg/m2  Wt Readings from Last 3 Encounters:  08/02/15 217 lb 3.2 oz (98.521 kg)  06/30/15 203 lb 12.8 oz (92.443 kg)  05/23/15 203 lb 8 oz (92.307 kg)    Physical Exam  Constitutional: She is oriented to person, place, and time. She appears well-developed and well-nourished. No distress.  Eyes: Conjunctivae and EOM are normal. Pupils are equal, round, and reactive to light.  Neck: Neck supple. No thyromegaly present.  Cardiovascular: Normal rate, regular rhythm, normal heart sounds and intact distal pulses.   No murmur heard. Pulmonary/Chest: Effort normal. No respiratory distress. She has wheezes (Bilaterally).  Musculoskeletal: Normal range of motion. She exhibits no edema or tenderness.  Lymphadenopathy:    She has no cervical adenopathy.  Neurological: She is alert and oriented to person, place, and time. Coordination normal.  Skin: Skin is warm and dry. No rash noted. She is not diaphoretic.  Psychiatric: Her speech is normal and behavior is normal. Judgment and thought content normal. Her mood appears anxious. She exhibits a depressed mood (much improved). She expresses no suicidal ideation. She expresses no suicidal plans.  Nursing note and vitals reviewed.  Diabetic Foot Exam - Simple   Simple Foot Form  Diabetic Foot exam was performed with the following findings:  Yes 08/02/2015  4:00 PM  Visual Inspection    No deformities, no ulcerations, no other skin breakdown bilaterally:  Yes  Sensation Testing  See comments:  Yes  Pulse Check  Posterior Tibialis and Dorsalis pulse intact bilaterally:  Yes  Comments  Decreased sensation on the lateral aspect of feet but patient also describes burning sensation.       Results for orders placed or performed in visit on 07/07/15  CMP14+EGFR  Result Value Ref Range   Glucose 165 (H) 65 - 99 mg/dL   BUN 17 6 - 24 mg/dL   Creatinine, Ser 0.92 0.57 - 1.00 mg/dL   GFR calc non Af Amer 73 >59 mL/min/1.73   GFR calc Af Amer 85 >59 mL/min/1.73   BUN/Creatinine Ratio 18 9 - 23   Sodium 141 134 - 144 mmol/L   Potassium 4.2 3.5 - 5.2 mmol/L   Chloride 98 97 - 108 mmol/L   CO2 23 18 - 29 mmol/L   Calcium 9.7 8.7 - 10.2 mg/dL   Total Protein 6.7 6.0 - 8.5 g/dL   Albumin 4.4 3.5 - 5.5 g/dL   Globulin, Total 2.3 1.5 - 4.5 g/dL   Albumin/Globulin Ratio 1.9 1.1 - 2.5   Bilirubin Total 0.2 0.0 - 1.2 mg/dL   Alkaline Phosphatase 115 39 - 117 IU/L   AST 18 0 - 40 IU/L   ALT 16 0 - 32 IU/L  Lipid panel  Result Value Ref Range   Cholesterol, Total 276 (H) 100 - 199 mg/dL   Triglycerides 362 (H) 0 - 149 mg/dL   HDL 42 >39 mg/dL   VLDL Cholesterol Cal 72 (H) 5 - 40 mg/dL   LDL Calculated 162 (H) 0 - 99 mg/dL   Chol/HDL Ratio 6.6 (H) 0.0 - 4.4 ratio units  Thyroid Panel With TSH  Result Value Ref Range   TSH 0.844 0.450 - 4.500 uIU/mL   T4, Total 6.3 4.5 - 12.0 ug/dL   T3 Uptake Ratio 29 24 - 39 %   Free Thyroxine Index 1.8  1.2 - 4.9      Assessment & Plan:   Problem List Items Addressed This Visit      Cardiovascular and Mediastinum   HYPERTENSION, BENIGN ESSENTIAL    Elevated and uncontrolled today. Will start lisinopril hydrochlorothiazide 10-12.5. We'll see back in a month      Relevant Medications   lisinopril-hydrochlorothiazide (PRINZIDE,ZESTORETIC) 10-12.5 MG tablet     Respiratory   COPD mixed type ([place])    She was diagnosed with sleep  hypoxia and given oxygen to wear. She wears 2-3 L overnight but sometimes has to use it occasionally during the day as well. She still has subjective shortness of breath intermittently and the albuterol inhaler helps. She is taking Spiriva but mostly at night. She is also taking her Advair. She is still smoking. We will do a referral to pulmonology      Relevant Orders   Ambulatory referral to Pulmonology     Endocrine   Diabetic neuropathy [facility])    Patient started to develop a burning and tingling sensation like her feet are falling asleep over the past couple months. She denies any sores or ulcerations or cuts on her feet. We will start gabapentin and see if this helps her with this.      Relevant Medications   gabapentin (NEURONTIN) 300 MG capsule   lisinopril-hydrochlorothiazide (PRINZIDE,ZESTORETIC) 10-12.5 MG tablet     Other   Depression - Primary    Patient is feeling better as far as her depression and anxiety but not completely better. She has been on Wellbutrin but she also does not like the increased appetite but comes with Wellbutrin or she has some thoughts that come to her that she describes as weird or crazy or off the wall. She denies any suicidal ideations or thoughts of harming herself. We will switch her to Zoloft and see if it does better.      Relevant Medications   sertraline (ZOLOFT) 100 MG tablet       Follow up plan: Return in about 4 weeks (around 08/30/2015), or if symptoms worsen or fail to improve, for HTN f/u.  Caryl Pina, MD [facility] 08/02/2015, 1:43 PM

## 2015-08-03 NOTE — Assessment & Plan Note (Signed)
Patient started to develop a burning and tingling sensation like her feet are falling asleep over the past couple months. She denies any sores or ulcerations or cuts on her feet. We will start gabapentin and see if this helps her with this.

## 2015-08-03 NOTE — Assessment & Plan Note (Signed)
Elevated and uncontrolled today. Will start lisinopril hydrochlorothiazide 10-12.5. We'll see back in a month

## 2015-08-03 NOTE — Assessment & Plan Note (Signed)
She was diagnosed with sleep hypoxia and given oxygen to wear. She wears 2-3 L overnight but sometimes has to use it occasionally during the day as well. She still has subjective shortness of breath intermittently and the albuterol inhaler helps. She is taking Spiriva but mostly at night. She is also taking her Advair. She is still smoking. We will do a referral to pulmonology

## 2015-08-03 NOTE — Assessment & Plan Note (Signed)
Patient is feeling better as far as her depression and anxiety but not completely better. She has been on Wellbutrin but she also does not like the increased appetite but comes with Wellbutrin or she has some thoughts that come to her that she describes as weird or crazy or off the wall. She denies any suicidal ideations or thoughts of harming herself. We will switch her to Zoloft and see if it does better.

## 2015-08-07 ENCOUNTER — Encounter: Payer: Self-pay | Admitting: Cardiology

## 2015-08-07 ENCOUNTER — Ambulatory Visit (INDEPENDENT_AMBULATORY_CARE_PROVIDER_SITE_OTHER): Payer: Medicaid Other | Admitting: Cardiology

## 2015-08-07 ENCOUNTER — Encounter: Payer: Self-pay | Admitting: Internal Medicine

## 2015-08-07 ENCOUNTER — Ambulatory Visit (INDEPENDENT_AMBULATORY_CARE_PROVIDER_SITE_OTHER): Payer: Medicaid Other | Admitting: *Deleted

## 2015-08-07 VITALS — BP 152/82 | HR 72

## 2015-08-07 DIAGNOSIS — I10 Essential (primary) hypertension: Secondary | ICD-10-CM

## 2015-08-07 DIAGNOSIS — I495 Sick sinus syndrome: Secondary | ICD-10-CM | POA: Diagnosis not present

## 2015-08-07 DIAGNOSIS — Z950 Presence of cardiac pacemaker: Secondary | ICD-10-CM

## 2015-08-07 DIAGNOSIS — Z8673 Personal history of transient ischemic attack (TIA), and cerebral infarction without residual deficits: Secondary | ICD-10-CM | POA: Diagnosis not present

## 2015-08-07 DIAGNOSIS — E782 Mixed hyperlipidemia: Secondary | ICD-10-CM

## 2015-08-07 LAB — CUP PACEART INCLINIC DEVICE CHECK
Battery Remaining Longevity: 24 mo
Brady Statistic RA Percent Paced: 62 %
Implantable Lead Implant Date: 20090123
Implantable Lead Implant Date: 20090123
Implantable Lead Location: 753860
Implantable Lead Model: 5076
Lead Channel Impedance Value: 380 Ohm
Lead Channel Pacing Threshold Amplitude: 0.6 V
Lead Channel Pacing Threshold Amplitude: 0.7 V
Lead Channel Pacing Threshold Pulse Width: 0.4 ms
Lead Channel Setting Pacing Amplitude: 2 V
Lead Channel Setting Pacing Pulse Width: 0.4 ms
MDC IDC LEAD LOCATION: 753859
MDC IDC MSMT LEADCHNL RA SENSING INTR AMPL: 1.5 mV — AB
MDC IDC MSMT LEADCHNL RV IMPEDANCE VALUE: 560 Ohm
MDC IDC MSMT LEADCHNL RV PACING THRESHOLD PULSEWIDTH: 0.4 ms
MDC IDC MSMT LEADCHNL RV SENSING INTR AMPL: 7.5 mV
MDC IDC PG SERIAL: 573704
MDC IDC SESS DTM: 20161114050000
MDC IDC SET LEADCHNL RA PACING AMPLITUDE: 2 V
MDC IDC SET LEADCHNL RV SENSING SENSITIVITY: 2.5 mV
MDC IDC STAT BRADY RV PERCENT PACED: 0 %

## 2015-08-07 NOTE — Patient Instructions (Signed)
Your physician wants you to follow-up in: 6 months with Dr McDowell You will receive a reminder letter in the mail two months in advance. If you don't receive a letter, please call our office to schedule the follow-up appointment.     Your physician recommends that you continue on your current medications as directed. Please refer to the Current Medication list given to you today.    If you need a refill on your cardiac medications before your next appointment, please call your pharmacy.     Thank you for choosing [facility] !        

## 2015-08-07 NOTE — Progress Notes (Signed)
Pacemaker check in clinic. Normal device function. Thresholds, sensing, impedances consistent with previous measurements. Device programmed to maximize longevity. No mode switch episodes. (1) high ventricular rate noted---AT per EGM. Device programmed at appropriate safety margins. Histogram distribution appropriate for patient activity level. Device programmed to optimize intrinsic conduction. Estimated longevity 2 years. Patient will follow up with GT/R in 6 months.

## 2015-08-07 NOTE — Progress Notes (Signed)
Cardiology Office Note  Date: 08/07/2015   ID: MIKAL WISMAN, DOB 1965-10-26, MRN [id number]  PCP: Nils Pyle, MD  Primary Cardiologist: Nona Dell, MD   Chief Complaint  Patient presents with  . Cardiac follow-up    History of Present Illness: [name] is a 49 y.o. female last seen in May. She presents for a routine cardiac visit. I reviewed her interval notes, she continues to follow up with [facility]. She has had intermittent noncompliance with her medications, reports that she is on all of the medicines outlined below at this time. I did review her most recent lab work which is outlined below.  She does not report chest pain, has NYHA class II dyspnea with typical activities. Reports functional limitations related to bilateral knee pain and also peripheral neuropathy. She reports that she will be undergoing right knee replacement in the next several months.  She had an interrogation of her [employer] pacemaker by Dr. [name] back in May. Device is being re-interrogated today. She does not report any palpitations, has had no interval syncope.  Recent LDL was 162, previously better controlled, however she has not been on Zocor consistently.  She states that she has follow-up arranged with the Pulmonary Division, pending at this time. She has OSA and COPD.   Past Medical History  Diagnosis Date  . Type 2 diabetes mellitus (HCC)   . Essential hypertension   . History of stroke     Jun 2011 -- right hand weakness  . Inappropriate sinus tachycardia (HCC)     Sinus node modification 02-25-2003 by Dr. Lewayne Bunting  . History of kidney stones   . GERD (gastroesophageal reflux disease)   . History of hiatal hernia   . History of syncope   . Sinus node dysfunction (HCC)     Symptomatic bradycardia  . Symptomatic sinus bradycardia   . Cardiac pacemaker in situ 2009    DDD [employer] -- [place] 60  . Crohn's disease (HCC)     Large intestine  .  Gastroparesis   . Pelvic pain in female   . Arthritis   . Wears glasses   . OSA (obstructive sleep apnea)     Study done 2005 -- pt refused CPAP/previously was using nocturnal oxygen until one year ago pt states PCP is monitoring pt without  . COPD with asthma (HCC)     GOLD 2-3 --  pulmologist (last visit 2011) Dr. Marchelle Gearing  . Depression 2016    PTSD    Past Surgical History  Procedure Laterality Date  . Cesarean section  x2  . Tonsillectomy  11-25-2002  . Cardiac catheterization  05-08-2001  dr Nicki Guadalajara    normal coronary arteries and LVF  . Cardiac electrophysiology study w/  sinus node modification  02-25-2003  dr gregg taylor  . Cholecystectomy  1994  . Cardiac pacemaker placement  10-16-2007  dr Sharlot Gowda taylor    DDD-- [facility]  . Cysto/  ureteroscopic stone extraction  03/ 2005  . Bilateral knee arthroscopy w/ chondromalacia patella  bilateral ---- 12-26-2010; 08-14-2009;  04-25-2008;  03-09-2007    additional same surgery, Left knee 2005;  x2 2006 ---  Right knee 2005;  2006;  x2  2007  . Cardiovascular stress test  08-02-2010  dr Diona Browner    normal lexiscan study/  ef 59%  . Cystoscopy with hydrodistension and biopsy N/A 05/23/2015    Procedure: CYSTOSCOPY/BIOPSY/HYDRODISTENSION;  Surgeon: Su Grand, MD;  Location: [facility];  Service: Urology;  Laterality: N/A;    Current Outpatient Prescriptions  Medication Sig Dispense Refill  . albuterol (PROVENTIL) (2.5 MG/3ML) 0.083% nebulizer solution Take 3 mLs (2.5 mg total) by nebulization every 6 (six) hours as needed for wheezing or shortness of breath. 75 mL 12  . aspirin EC 81 MG tablet Take 81 mg by mouth daily.    . carvedilol (COREG) 12.5 MG tablet Take 1 tablet (12.5 mg total) by mouth 2 (two) times daily with a meal. 60 tablet 6  . clopidogrel (PLAVIX) 75 MG tablet Take 1 tablet (75 mg total) by mouth daily. 30 tablet 5  . diclofenac sodium (VOLTAREN) 1 % GEL Apply 4 g topically 4  (four) times daily. 200 g 11  . diphenoxylate-atropine (LOMOTIL) 2.5-0.025 MG tablet Take 2 tablets by mouth 4 (four) times daily as needed for diarrhea or loose stools. 30 tablet 0  . Fluticasone-Salmeterol (ADVAIR DISKUS) 250-50 MCG/DOSE AEPB Inhale 2 puffs into the lungs 2 (two) times daily. 1 each 3  . furosemide (LASIX) 40 MG tablet Take 1 tablet (40 mg total) by mouth daily. (Patient taking differently: Take 40 mg by mouth every morning. ) 30 tablet 3  . gabapentin (NEURONTIN) 300 MG capsule Take 1 capsule (300 mg total) by mouth 3 (three) times daily. 90 capsule 3  . ibuprofen (ADVIL,MOTRIN) 800 MG tablet Take 1 tablet (800 mg total) by mouth every 8 (eight) hours as needed. 30 tablet 0  . insulin aspart (NOVOLOG) 100 UNIT/ML injection Inject 15 Units into the skin 3 (three) times daily before meals. 15 mL 11  . insulin detemir (LEVEMIR) 100 UNIT/ML injection Inject 0.8 mLs (80 Units total) into the skin at bedtime. (Patient taking differently: Inject 40 Units into the skin at bedtime. ) 30 mL 11  . lisinopril-hydrochlorothiazide (PRINZIDE,ZESTORETIC) 10-12.5 MG tablet Take 1 tablet by mouth daily. 90 tablet 3  . metFORMIN (GLUCOPHAGE) 1000 MG tablet Take 1 tablet (1,000 mg total) by mouth 2 (two) times daily with a meal. 180 tablet 3  . ondansetron (ZOFRAN-ODT) 8 MG disintegrating tablet Take 1 tablet (8 mg total) by mouth every 6 (six) hours as needed for nausea or vomiting. 20 tablet 1  . pantoprazole (PROTONIX) 40 MG tablet TAKE 1 TABLET BY MOUTH DAILY FOR STOMACH 30 tablet 5  . potassium chloride SA (K-DUR,KLOR-CON) 20 MEQ tablet Take 1 tablet (20 mEq total) by mouth daily. (Patient taking differently: Take 20 mEq by mouth every morning. ) 30 tablet 3  . sertraline (ZOLOFT) 100 MG tablet Take 1 tablet (100 mg total) by mouth daily. 30 tablet 1  . simvastatin (ZOCOR) 40 MG tablet Take 1 tablet (40 mg total) by mouth at bedtime. 90 tablet 1  . tiotropium (SPIRIVA HANDIHALER) 18 MCG  inhalation capsule Place 1 capsule (18 mcg total) into inhaler and inhale daily. (Patient taking differently: Place 18 mcg into inhaler and inhale every evening. ) 30 capsule 11  . tiZANidine (ZANAFLEX) 4 MG tablet Take 3 tablets (12 mg total) by mouth at bedtime. 90 tablet 1  . Vitamin D, Ergocalciferol, (DRISDOL) 50000 UNITS CAPS capsule TAKE 1 CAPSULE BY MOUTH TWICE A WEEK FOR 2 MONTHS (Patient taking differently: TAKE 1 CAPSULE BY MOUTH TWICE A WEEK) 16 capsule 0   No current facility-administered medications for this visit.    Allergies:  Amoxicillin and Flexeril   Social History: The patient  reports that she has been smoking Cigarettes.  She started smoking about  42 years ago. She has a 60 pack-year smoking history. She has never used smokeless tobacco. She reports that she does not drink alcohol or use illicit drugs.   ROS:  Please see the history of present illness. Otherwise, complete review of systems is positive for arthritic knee pain, peripheral neuropathy.  All other systems are reviewed and negative.   Physical Exam: VS:  BP 152/82 mmHg  Pulse 72  SpO2 97%, BMI There is no weight on file to calculate BMI.  Wt Readings from Last 3 Encounters:  08/02/15 217 lb 3.2 oz (98.521 kg)  06/30/15 203 lb 12.8 oz (92.443 kg)  05/23/15 203 lb 8 oz (92.307 kg)     General: Overweight woman, appears comfortable at rest. HEENT: Conjunctiva and lids normal, oropharynx clear with poor dentition. Neck: Supple, no elevated JVP or carotid bruits, no thyromegaly. Lungs: Clear to auscultation, nonlabored breathing at rest. Cardiac: Regular rate and rhythm, no S3 or significant systolic murmur, no pericardial rub. Abdomen: Soft, nontender, bowel sounds present. Extremities: No pitting edema, distal pulses 2+. Skin: Warm and dry. Scattered tattoos noted. Musculoskeletal: No kyphosis. Neuropsychiatric: Alert and oriented x3, affect grossly appropriate.   ECG: Tracing from 02/03/2015 showed  normal sinus rhythm.  Recent Labwork: 01/25/2015: Platelets 145* 05/23/2015: Hemoglobin 15.6* 07/07/2015: ALT 16; AST 18; BUN 17; Creatinine, Ser 0.92; Potassium 4.2; Sodium 141; TSH 0.844     Component Value Date/Time   CHOL 276* 07/07/2015 1052   CHOL 256* 01/30/2015 1329   TRIG 362* 07/07/2015 1052   TRIG 422* 01/30/2015 1329   HDL 42 07/07/2015 1052   HDL 46 01/30/2015 1329   HDL 50 03/01/2009   CHOLHDL 6.6* 07/07/2015 1052   CHOLHDL 3.6 CALC 02/29/2008 0935   VLDL 29 02/29/2008 0935   LDLCALC 162* 07/07/2015 1052   LDLCALC 74 03/01/2009    ASSESSMENT AND PLAN:  1. History of inappropriate sinus tachycardia status post sinus node modification by Dr. [name] in 2004. She subsequently developed symptomatic sinus bradycardia due to sinus node dysfunction and is status post [employer] pacemaker placement by Dr. [name] in 2009. Device follow-up continues in the device clinic.  2. History of syncope, no recent recurrences.  3. Hyperlipidemia, on statin therapy with intermittent noncompliance. Recent LDL 162, was previously much better controlled. Discussed medication compliance and diet.  4. Essential hypertension, blood pressure trend is better compared to her most recent primary care visit. Control has been complicated by noncompliance.  5. History of stroke, on Plavix.  Current medicines were reviewed at length with the patient today.  Disposition: FU with me in 6 months.   Signed, Jonelle Sidle, MD, [facility] 08/07/2015 12:05 PM    [facility] at [facility] [address] [place], [place] [address] Phone: [phone]; Fax: [phone]

## 2015-08-08 ENCOUNTER — Other Ambulatory Visit: Payer: Self-pay | Admitting: Family Medicine

## 2015-08-09 MED ORDER — VENLAFAXINE HCL ER 37.5 MG PO CP24: 37.5000 mg | ORAL_CAPSULE | Freq: Every day | ORAL | Status: DC

## 2015-08-09 NOTE — Telephone Encounter (Signed)
Gabapentin is supposed to help with the burning pain in the feet not the pain from the ankle that she needs to see orthopedic for. If the gabapentin is not working and making her too sedated then we can switch her to Effexor extended release 37.5 mg once daily, give her 1 month supply with 1 refill and have her come back in a month and see me. If she does not want to try that and we can try increasing the gabapentin but that might be more sedating. Arville Care, MD [facility] 08/09/2015, 11:52 AM

## 2015-08-09 NOTE — Telephone Encounter (Signed)
Patient wants to try Effexor. Script sent to pharmacy.

## 2015-08-25 ENCOUNTER — Other Ambulatory Visit: Payer: Self-pay | Admitting: Family Medicine

## 2015-08-30 ENCOUNTER — Ambulatory Visit (INDEPENDENT_AMBULATORY_CARE_PROVIDER_SITE_OTHER): Payer: Medicaid Other | Admitting: Family Medicine

## 2015-08-30 ENCOUNTER — Encounter: Payer: Self-pay | Admitting: Family Medicine

## 2015-08-30 VITALS — BP 103/65 | HR 60 | Temp 98.1°F | Ht 67.0 in | Wt 207.8 lb

## 2015-08-30 DIAGNOSIS — E1142 Type 2 diabetes mellitus with diabetic polyneuropathy: Secondary | ICD-10-CM

## 2015-08-30 DIAGNOSIS — Z124 Encounter for screening for malignant neoplasm of cervix: Secondary | ICD-10-CM

## 2015-08-30 DIAGNOSIS — Z794 Long term (current) use of insulin: Secondary | ICD-10-CM

## 2015-08-30 DIAGNOSIS — F329 Major depressive disorder, single episode, unspecified: Secondary | ICD-10-CM

## 2015-08-30 DIAGNOSIS — E1165 Type 2 diabetes mellitus with hyperglycemia: Secondary | ICD-10-CM

## 2015-08-30 DIAGNOSIS — I10 Essential (primary) hypertension: Secondary | ICD-10-CM

## 2015-08-30 DIAGNOSIS — J439 Emphysema, unspecified: Secondary | ICD-10-CM

## 2015-08-30 DIAGNOSIS — IMO0002 Reserved for concepts with insufficient information to code with codable children: Secondary | ICD-10-CM

## 2015-08-30 DIAGNOSIS — F32A Depression, unspecified: Secondary | ICD-10-CM

## 2015-08-30 DIAGNOSIS — Z23 Encounter for immunization: Secondary | ICD-10-CM | POA: Diagnosis not present

## 2015-08-30 MED ORDER — PANTOPRAZOLE SODIUM 40 MG PO TBEC: DELAYED_RELEASE_TABLET | ORAL | Status: DC

## 2015-08-30 MED ORDER — LISINOPRIL-HYDROCHLOROTHIAZIDE 10-12.5 MG PO TABS: 1.0000 | ORAL_TABLET | Freq: Every day | ORAL | Status: DC

## 2015-08-30 MED ORDER — FUROSEMIDE 40 MG PO TABS: 40.0000 mg | ORAL_TABLET | Freq: Every morning | ORAL | Status: DC

## 2015-08-30 NOTE — Progress Notes (Signed)
BP 103/65 mmHg  Pulse 60  Temp(Src) 98.1 F (36.7 C) (Oral)  Ht  (1.702 m)  Wt 207 lb 12.8 oz (94.257 kg)  BMI 32.54 kg/m2   Subjective:    Patient ID: [name], female    DOB: [birth year], 49 y.o.   MRN: [id number]  HPI: [name] is a 49 y.o. female presenting on 08/30/2015 for Hypertension and Depression   HPI Hypertension Patient comes in today for hypertension recheck. She is currently taking Coreg and it was lisinopril hydrochlorothiazide for her blood pressure. She has been stable on these and her blood pressure is good today. Patient denies headaches, blurred vision, chest pains, shortness of breath, or weakness. Denies any side effects from medication and is content with current medication.   Diabetes type 2 She is also coming in for a diabetic recheck and she is currently on Levemir 40 units at bedtime and NovoLog 15 units 3 times daily before meals. Her last hemoglobin A1c was 13.62 months ago and she was not on her medications at that point. She is currently not due for hemoglobin A1c yet. She says her blood pressures are still running in the 200s in the afternoons but in the morning she gets down to right around 90-100. She denies any recurrent lows. She did have 1 low down in the 60s but other than that has not had too many.  COPD She is seeing pulmonology for her COPD previously and would like a referral to go back. She is currently on Spiriva and Advair and is still having to use her nebulizer and rescue inhalers 5-6 times daily. This is been going on for at least the past 5 months. She is still smoking. She is having daily symptoms and nightly symptoms.  Relevant past medical, surgical, family and social history reviewed and updated as indicated. Interim medical history since our last visit reviewed. Allergies and medications reviewed and updated.  Review of Systems  Constitutional: Negative for fever and chills.  HENT: Positive for congestion. Negative  for ear discharge and ear pain.   Eyes: Negative for redness and visual disturbance.  Respiratory: Positive for cough, chest tightness, shortness of breath and wheezing.   Cardiovascular: Negative for chest pain and leg swelling.  Genitourinary: Negative for dysuria and difficulty urinating.  Musculoskeletal: Negative for back pain and gait problem.  Skin: Negative for rash.  Neurological: Negative for dizziness, weakness, light-headedness, numbness and headaches.  Psychiatric/Behavioral: Negative for behavioral problems and agitation.  All other systems reviewed and are negative.   Per HPI unless specifically indicated above     Medication List       This list is accurate as of: 08/30/15  9:04 AM.  Always use your most recent med list.               albuterol (2.5 MG/3ML) 0.083% nebulizer solution  Commonly known as:  PROVENTIL  Take 3 mLs (2.5 mg total) by nebulization every 6 (six) hours as needed for wheezing or shortness of breath.     aspirin EC 81 MG tablet  Take 81 mg by mouth daily.     carvedilol 12.5 MG tablet  Commonly known as:  COREG  Take 1 tablet (12.5 mg total) by mouth 2 (two) times daily with a meal.     clopidogrel 75 MG tablet  Commonly known as:  PLAVIX  Take 1 tablet (75 mg total) by mouth daily.     diclofenac sodium 1 % Gel  Commonly known as:  VOLTAREN  Apply 4 g topically 4 (four) times daily.     diphenoxylate-atropine 2.5-0.025 MG tablet  Commonly known as:  LOMOTIL  Take 2 tablets by mouth 4 (four) times daily as needed for diarrhea or loose stools.     Fluticasone-Salmeterol 250-50 MCG/DOSE Aepb  Commonly known as:  ADVAIR DISKUS  Inhale 2 puffs into the lungs 2 (two) times daily.     furosemide 40 MG tablet  Commonly known as:  LASIX  Take 1 tablet (40 mg total) by mouth every morning.     gabapentin 300 MG capsule  Commonly known as:  NEURONTIN  Take 1 capsule (300 mg total) by mouth 3 (three) times daily.     ibuprofen 800  MG tablet  Commonly known as:  ADVIL,MOTRIN  TAKE 1 TABLET(800 MG) BY MOUTH EVERY 8 HOURS AS NEEDED     insulin aspart 100 UNIT/ML injection  Commonly known as:  NOVOLOG  Inject 15 Units into the skin 3 (three) times daily before meals.     insulin detemir 100 UNIT/ML injection  Commonly known as:  LEVEMIR  Inject 0.8 mLs (80 Units total) into the skin at bedtime.     lisinopril-hydrochlorothiazide 10-12.5 MG tablet  Commonly known as:  PRINZIDE,ZESTORETIC  Take 1 tablet by mouth daily.     metFORMIN 1000 MG tablet  Commonly known as:  GLUCOPHAGE  Take 1 tablet (1,000 mg total) by mouth 2 (two) times daily with a meal.     ondansetron 8 MG disintegrating tablet  Commonly known as:  ZOFRAN-ODT  Take 1 tablet (8 mg total) by mouth every 6 (six) hours as needed for nausea or vomiting.     pantoprazole 40 MG tablet  Commonly known as:  PROTONIX  TAKE 1 TABLET BY MOUTH DAILY FOR STOMACH     potassium chloride SA 20 MEQ tablet  Commonly known as:  K-DUR,KLOR-CON  TAKE 1 TABLET(20 MEQ) BY MOUTH DAILY     sertraline 100 MG tablet  Commonly known as:  ZOLOFT  Take 1 tablet (100 mg total) by mouth daily.     simvastatin 40 MG tablet  Commonly known as:  ZOCOR  Take 1 tablet (40 mg total) by mouth at bedtime.     tiotropium 18 MCG inhalation capsule  Commonly known as:  SPIRIVA HANDIHALER  Place 1 capsule (18 mcg total) into inhaler and inhale daily.     tiZANidine 4 MG tablet  Commonly known as:  ZANAFLEX  TAKE 3 TABLETS(12 MG) BY MOUTH AT BEDTIME     venlafaxine XR 37.5 MG 24 hr capsule  Commonly known as:  EFFEXOR XR  Take 1 capsule (37.5 mg total) by mouth daily with breakfast.     Vitamin D (Ergocalciferol) 50000 UNITS Caps capsule  Commonly known as:  DRISDOL  TAKE 1 CAPSULE BY MOUTH TWICE A WEEK FOR 2 MONTHS           Objective:    BP 103/65 mmHg  Pulse 60  Temp(Src) 98.1 F (36.7 C) (Oral)  Ht 5\' 7"  (1.702 m)  Wt 207 lb 12.8 oz (94.257 kg)  BMI 32.54  kg/m2  Wt Readings from Last 3 Encounters:  08/30/15 207 lb 12.8 oz (94.257 kg)  08/02/15 217 lb 3.2 oz (98.521 kg)  06/30/15 203 lb 12.8 oz (92.443 kg)    Physical Exam  Constitutional: She is oriented to person, place, and time. She appears well-developed and well-nourished. No distress.  Eyes: Conjunctivae and EOM  are normal. Pupils are equal, round, and reactive to light.  Neck: Neck supple. No thyromegaly present.  Cardiovascular: Normal rate, regular rhythm, normal heart sounds and intact distal pulses.   No murmur heard. Pulmonary/Chest: Effort normal. No respiratory distress. She has wheezes (trace wheezes in upper lobes). She has no rales. She exhibits no tenderness.  Musculoskeletal: Normal range of motion. She exhibits no edema or tenderness.  Lymphadenopathy:    She has no cervical adenopathy.  Neurological: She is alert and oriented to person, place, and time. No cranial nerve deficit. Coordination normal.  Skin: Skin is warm and dry. No rash noted. She is not diaphoretic.  Psychiatric: She has a normal mood and affect. Her behavior is normal.  Nursing note and vitals reviewed.   Results for orders placed or performed in visit on 08/07/15  Implantable device check  Result Value Ref Range   Date Time Interrogation Session [id number]    Pulse Generator Manufacturer BOST    Pulse Gen Model S602 ALTRUA 60    Pulse Gen Serial Number [id number]    Implantable Pulse Generator Type Implantable Pulse Generator    Implantable Pulse Generator Implant Date [id number]    Implantable Lead Manufacturer [facility]    Implantable Lead Model 5076 CapSureFix Novus    Implantable Lead Serial Number [id number]    Implantable Lead Implant Date [id number]    Implantable Lead Location [id number]    Implantable Lead Manufacturer [facility]    Implantable Lead Model 1699T    Implantable Lead Serial Number [id number]    Implantable Lead Implant Date [id number]    Implantable Lead Location [phone]     Lead Channel Setting Sensing Sensitivity 2.5 mV   Lead Channel Setting Sensing Adaptation Mode Fixed Pacing    Lead Channel Setting Pacing Amplitude 2.0 V   Lead Channel Setting Pacing Pulse Width 0.40 ms   Lead Channel Setting Pacing Amplitude 2.0 V   Lead Channel Impedance Value 380 ohm   Lead Channel Sensing Intrinsic Amplitude 1.5 (>) mV   Lead Channel Pacing Threshold Amplitude 0.7 V   Lead Channel Pacing Threshold Pulse Width 0.40 ms   Lead Channel Impedance Value 560 ohm   Lead Channel Sensing Intrinsic Amplitude 7.5 mV   Lead Channel Pacing Threshold Amplitude 0.6 V   Lead Channel Pacing Threshold Pulse Width 0.40 ms   Battery Status BOS    Battery Remaining Longevity 24 mo   Brady Statistic RA Percent Paced 62 %   Brady Statistic RV Percent Paced 0 %   Eval Rhythm Jx       Assessment & Plan:   Problem List Items Addressed This Visit      Cardiovascular and Mediastinum   HYPERTENSION, BENIGN ESSENTIAL - Primary   Relevant Medications   lisinopril-hydrochlorothiazide (PRINZIDE,ZESTORETIC) 10-12.5 MG tablet   furosemide (LASIX) 40 MG tablet     Endocrine   Diabetes mellitus type 2, uncontrolled (HCC)   Relevant Medications   lisinopril-hydrochlorothiazide (PRINZIDE,ZESTORETIC) 10-12.5 MG tablet   Other Relevant Orders   Ambulatory referral to Ophthalmology     Other   Depression    Doing well on her Zoloft, happy with the dosing.       Other Visit Diagnoses    Screening for cervical cancer        Relevant Orders    Ambulatory referral to Gynecology    Pulmonary emphysema, unspecified emphysema type [facility])        Relevant Orders    Ambulatory referral to Pulmonology  Follow up plan: Return in about 2 months (around 10/31/2015), or if symptoms worsen or fail to improve, for HTN, DM, COPD.  Counseling provided for all of the vaccine components Orders Placed This Encounter  Procedures  . Ambulatory referral to Ophthalmology  . Ambulatory referral to  Gynecology  . Ambulatory referral to Pulmonology    Arville Care, MD [facility] 08/30/2015, 9:04 AM

## 2015-09-01 ENCOUNTER — Telehealth: Payer: Self-pay | Admitting: Family Medicine

## 2015-09-01 DIAGNOSIS — K589 Irritable bowel syndrome without diarrhea: Secondary | ICD-10-CM

## 2015-09-01 NOTE — Assessment & Plan Note (Signed)
Doing well on her Zoloft, happy with the dosing.

## 2015-09-01 NOTE — Telephone Encounter (Signed)
We likely will have to get her off to gastroenterology, go ahead and do a referral for them. I think she has already seen them before but needs to go back. Arville Care, MD [facility] 09/01/2015, 12:54 PM

## 2015-09-01 NOTE — Telephone Encounter (Signed)
Stp and advised of MD feedback, referral put in.

## 2015-09-04 ENCOUNTER — Encounter: Payer: Self-pay | Admitting: Gastroenterology

## 2015-09-05 ENCOUNTER — Telehealth: Payer: Self-pay | Admitting: Family Medicine

## 2015-09-05 NOTE — Telephone Encounter (Signed)
Okay sounds good 

## 2015-09-05 NOTE — Telephone Encounter (Signed)
Please address

## 2015-09-21 ENCOUNTER — Other Ambulatory Visit: Payer: Self-pay | Admitting: Family Medicine

## 2015-09-22 ENCOUNTER — Ambulatory Visit (INDEPENDENT_AMBULATORY_CARE_PROVIDER_SITE_OTHER): Payer: Medicaid Other | Admitting: Family Medicine

## 2015-09-22 ENCOUNTER — Encounter: Payer: Self-pay | Admitting: Family Medicine

## 2015-09-22 VITALS — BP 144/88 | HR 60 | Temp 97.4°F | Ht 67.0 in | Wt 213.8 lb

## 2015-09-22 DIAGNOSIS — J441 Chronic obstructive pulmonary disease with (acute) exacerbation: Secondary | ICD-10-CM

## 2015-09-22 MED ORDER — PREDNISONE 20 MG PO TABS: ORAL_TABLET | ORAL | Status: DC

## 2015-09-22 MED ORDER — AZITHROMYCIN 250 MG PO TABS: ORAL_TABLET | ORAL | Status: DC

## 2015-09-22 MED ORDER — METHYLPREDNISOLONE ACETATE 80 MG/ML IJ SUSP
80.0000 mg | Freq: Once | INTRAMUSCULAR | Status: AC
  Administered 2015-09-22: 80 mg via INTRAMUSCULAR

## 2015-09-22 NOTE — Progress Notes (Signed)
BP 144/88 mmHg  Pulse 60  Temp(Src) 97.4 F (36.3 C) (Oral)  Ht  (1.702 m)  Wt 213 lb 12.8 oz (96.979 kg)  BMI 33.48 kg/m2  SpO2 98%   Subjective:    Patient ID: [name], female    DOB: July 16, 1966, 49 y.o.   MRN: [id number]  HPI: [name] is a 49 y.o. female presenting on 09/22/2015 for Shortness of Breath; Hurting in right side of ribs; and Swelling in feet and ankles, pain from feet to hips   HPI Cough and wheezing pain between her ribs Patient has been having coughing and wheezing and pain between her ribs with coughing and inspiration. She is still smoking 1-1/2 packs per day. She uses her oxygen as needed but she doesn't uses it every night. She has been diagnosed with COPD she feels like her COPD flaring up. Along with this she feels like she's been having swelling in her legs. She denies any fevers or chills. Her cough is nonproductive.  Relevant past medical, surgical, family and social history reviewed and updated as indicated. Interim medical history since our last visit reviewed. Allergies and medications reviewed and updated.  Review of Systems  Constitutional: Negative for fever and chills.  HENT: Positive for congestion, postnasal drip, rhinorrhea, sinus pressure, sneezing and sore throat. Negative for ear discharge and ear pain.   Eyes: Negative for pain, redness and visual disturbance.  Respiratory: Positive for cough and wheezing. Negative for chest tightness and shortness of breath.   Cardiovascular: Negative for chest pain and leg swelling.  Genitourinary: Negative for dysuria and difficulty urinating.  Musculoskeletal: Negative for back pain and gait problem.  Skin: Negative for rash.  Neurological: Negative for light-headedness and headaches.  Psychiatric/Behavioral: Negative for behavioral problems and agitation.  All other systems reviewed and are negative.   Per HPI unless specifically indicated above     Medication List         This list is accurate as of: 09/22/15  4:15 PM.  Always use your most recent med list.               albuterol (2.5 MG/3ML) 0.083% nebulizer solution  Commonly known as:  PROVENTIL  Take 3 mLs (2.5 mg total) by nebulization every 6 (six) hours as needed for wheezing or shortness of breath.     aspirin EC 81 MG tablet  Take 81 mg by mouth daily.     azithromycin 250 MG tablet  Commonly known as:  ZITHROMAX  Take 2 the first day and then one each day after.     carvedilol 12.5 MG tablet  Commonly known as:  COREG  Take 1 tablet (12.5 mg total) by mouth 2 (two) times daily with a meal.     clopidogrel 75 MG tablet  Commonly known as:  PLAVIX  TAKE 1 TABLET BY MOUTH EVERY DAY     diclofenac sodium 1 % Gel  Commonly known as:  VOLTAREN  Apply 4 g topically 4 (four) times daily.     diphenoxylate-atropine 2.5-0.025 MG tablet  Commonly known as:  LOMOTIL  Take 2 tablets by mouth 4 (four) times daily as needed for diarrhea or loose stools.     Fluticasone-Salmeterol 250-50 MCG/DOSE Aepb  Commonly known as:  ADVAIR DISKUS  Inhale 2 puffs into the lungs 2 (two) times daily.     furosemide 40 MG tablet  Commonly known as:  LASIX  Take 1 tablet (40 mg total) by mouth  every morning.     gabapentin 300 MG capsule  Commonly known as:  NEURONTIN  Take 1 capsule (300 mg total) by mouth 3 (three) times daily.     ibuprofen 800 MG tablet  Commonly known as:  ADVIL,MOTRIN  TAKE 1 TABLET(800 MG) BY MOUTH EVERY 8 HOURS AS NEEDED     insulin aspart 100 UNIT/ML injection  Commonly known as:  NOVOLOG  Inject 15 Units into the skin 3 (three) times daily before meals.     insulin detemir 100 UNIT/ML injection  Commonly known as:  LEVEMIR  Inject 0.8 mLs (80 Units total) into the skin at bedtime.     lisinopril-hydrochlorothiazide 10-12.5 MG tablet  Commonly known as:  PRINZIDE,ZESTORETIC  Take 1 tablet by mouth daily.     metFORMIN 1000 MG tablet  Commonly known as:  GLUCOPHAGE   Take 1 tablet (1,000 mg total) by mouth 2 (two) times daily with a meal.     ondansetron 8 MG disintegrating tablet  Commonly known as:  ZOFRAN-ODT  Take 1 tablet (8 mg total) by mouth every 6 (six) hours as needed for nausea or vomiting.     pantoprazole 40 MG tablet  Commonly known as:  PROTONIX  TAKE 1 TABLET BY MOUTH DAILY FOR STOMACH     potassium chloride SA 20 MEQ tablet  Commonly known as:  K-DUR,KLOR-CON  TAKE 1 TABLET(20 MEQ) BY MOUTH DAILY     predniSONE 20 MG tablet  Commonly known as:  DELTASONE  2 po at same time daily for 5 days     sertraline 100 MG tablet  Commonly known as:  ZOLOFT  Take 1 tablet (100 mg total) by mouth daily.     simvastatin 40 MG tablet  Commonly known as:  ZOCOR  Take 1 tablet (40 mg total) by mouth at bedtime.     tiotropium 18 MCG inhalation capsule  Commonly known as:  SPIRIVA HANDIHALER  Place 1 capsule (18 mcg total) into inhaler and inhale daily.     tiZANidine 4 MG tablet  Commonly known as:  ZANAFLEX  TAKE 3 TABLETS(12 MG) BY MOUTH AT BEDTIME     venlafaxine XR 37.5 MG 24 hr capsule  Commonly known as:  EFFEXOR XR  Take 1 capsule (37.5 mg total) by mouth daily with breakfast.     Vitamin D (Ergocalciferol) 50000 units Caps capsule  Commonly known as:  DRISDOL  TAKE 1 CAPSULE BY MOUTH TWICE A WEEK FOR 2 MONTHS           Objective:    BP 144/88 mmHg  Pulse 60  Temp(Src) 97.4 F (36.3 C) (Oral)  Ht  (1.702 m)  Wt 213 lb 12.8 oz (96.979 kg)  BMI 33.48 kg/m2  SpO2 98%  Wt Readings from Last 3 Encounters:  09/22/15 213 lb 12.8 oz (96.979 kg)  08/30/15 207 lb 12.8 oz (94.257 kg)  08/02/15 217 lb 3.2 oz (98.521 kg)    Physical Exam  Constitutional: She is oriented to person, place, and time. She appears well-developed and well-nourished. No distress.  HENT:  Right Ear: Tympanic membrane, external ear and ear canal normal.  Left Ear: Tympanic membrane, external ear and ear canal normal.  Nose: Mucosal edema  and rhinorrhea present. No epistaxis. Right sinus exhibits no maxillary sinus tenderness and no frontal sinus tenderness. Left sinus exhibits no maxillary sinus tenderness and no frontal sinus tenderness.  Mouth/Throat: Uvula is midline and mucous membranes are normal. Posterior oropharyngeal edema and posterior  oropharyngeal erythema present. No oropharyngeal exudate or tonsillar abscesses.  Eyes: Conjunctivae and EOM are normal.  Neck: Neck supple. No thyromegaly present.  Cardiovascular: Normal rate, regular rhythm, normal heart sounds and intact distal pulses.   No murmur heard. Pulmonary/Chest: Effort normal. No respiratory distress. She has wheezes. She has no rales. She exhibits no tenderness.  Musculoskeletal: Normal range of motion. She exhibits no edema or tenderness.  Lymphadenopathy:    She has no cervical adenopathy.  Neurological: She is alert and oriented to person, place, and time. Coordination normal.  Skin: Skin is warm and dry. No rash noted. She is not diaphoretic.  Psychiatric: She has a normal mood and affect. Her behavior is normal.  Vitals reviewed.     Assessment & Plan:   Problem List Items Addressed This Visit    None    Visit Diagnoses    COPD exacerbation (HCC)    -  Primary    Relevant Medications    methylPREDNISolone acetate (DEPO-MEDROL) injection 80 mg (Completed)    predniSONE (DELTASONE) 20 MG tablet    azithromycin (ZITHROMAX) 250 MG tablet        Follow up plan: Return in about 2 months (around 11/21/2015), or if symptoms worsen or fail to improve, for COPD f/u .  Counseling provided for all of the vaccine components No orders of the defined types were placed in this encounter.    Arville Care, MD [facility] 09/22/2015, 4:15 PM

## 2015-09-27 ENCOUNTER — Telehealth: Payer: Self-pay | Admitting: Family Medicine

## 2015-09-28 NOTE — Telephone Encounter (Signed)
Telemetry to take an extra half dose of the Lasix for a couple of days. Make sure she is treating her sleep apnea with her mask because that can cause fluid increases well and she needs to weigh herself daily and record the numbers and if she is truly gaining weight over the next week or 2 and then she needs to be seen again.

## 2015-09-28 NOTE — Telephone Encounter (Signed)
Patient aware.

## 2015-09-29 ENCOUNTER — Other Ambulatory Visit: Payer: Self-pay | Admitting: Family Medicine

## 2015-10-02 ENCOUNTER — Institutional Professional Consult (permissible substitution): Payer: Medicaid Other | Admitting: Internal Medicine

## 2015-10-03 ENCOUNTER — Other Ambulatory Visit: Payer: Self-pay

## 2015-10-03 MED ORDER — SERTRALINE HCL 100 MG PO TABS: ORAL_TABLET | ORAL | Status: DC

## 2015-10-06 ENCOUNTER — Telehealth: Payer: Self-pay | Admitting: Family Medicine

## 2015-10-06 DIAGNOSIS — M25561 Pain in right knee: Secondary | ICD-10-CM

## 2015-10-06 DIAGNOSIS — M25559 Pain in unspecified hip: Secondary | ICD-10-CM

## 2015-10-06 DIAGNOSIS — M25562 Pain in left knee: Principal | ICD-10-CM

## 2015-10-06 NOTE — Telephone Encounter (Signed)
That is fine go ahead and do the referral to Dr. Keeling/orthopedics

## 2015-10-06 NOTE — Telephone Encounter (Signed)
Patient aware and referral placed.

## 2015-10-09 ENCOUNTER — Ambulatory Visit (INDEPENDENT_AMBULATORY_CARE_PROVIDER_SITE_OTHER): Payer: Medicaid Other | Admitting: Family Medicine

## 2015-10-09 ENCOUNTER — Encounter: Payer: Self-pay | Admitting: Family Medicine

## 2015-10-09 VITALS — BP 136/78 | HR 60 | Temp 97.9°F

## 2015-10-09 DIAGNOSIS — M25562 Pain in left knee: Secondary | ICD-10-CM | POA: Diagnosis not present

## 2015-10-09 DIAGNOSIS — G8929 Other chronic pain: Secondary | ICD-10-CM | POA: Diagnosis not present

## 2015-10-09 MED ORDER — HYDROCODONE-ACETAMINOPHEN 10-325 MG PO TABS: 1.0000 | ORAL_TABLET | Freq: Three times a day (TID) | ORAL | Status: DC | PRN

## 2015-10-09 NOTE — Progress Notes (Signed)
BP 136/78 mmHg  Pulse 60  Temp(Src) 97.9 F (36.6 C) (Oral)   Subjective:    Patient ID: [name], female    DOB: 1966/06/08, 50 y.o.   MRN: [id number]  HPI: [name] is a 50 y.o. female presenting on 10/09/2015 for Hip Pain and Knee Pain   HPI Left hip pain and left knee pain Patient has been dealing with a chronic left knee and left hip pain that has been worsening and she has a referral to go see an orthopedic physician but that is not for another 2 weeks. She denies any fevers or chills or warmth or redness around the joints. She has been having increasing difficulties with walking with her cane. She has been having ask her husband to do increasingly more things for her than she used to.  Relevant past medical, surgical, family and social history reviewed and updated as indicated. Interim medical history since our last visit reviewed. Allergies and medications reviewed and updated.  Review of Systems  Constitutional: Negative for fever and chills.  HENT: Negative for congestion, ear discharge and ear pain.   Eyes: Negative for redness and visual disturbance.  Respiratory: Negative for chest tightness and shortness of breath.   Cardiovascular: Negative for chest pain and leg swelling.  Genitourinary: Negative for dysuria and difficulty urinating.  Musculoskeletal: Positive for arthralgias. Negative for back pain and gait problem.  Skin: Negative for color change and rash.  Neurological: Negative for dizziness, light-headedness and headaches.  Psychiatric/Behavioral: Negative for behavioral problems and agitation.  All other systems reviewed and are negative.   Per HPI unless specifically indicated above     Medication List       This list is accurate as of: 10/09/15 11:45 AM.  Always use your most recent med list.               albuterol (2.5 MG/3ML) 0.083% nebulizer solution  Commonly known as:  PROVENTIL  Take 3 mLs (2.5 mg total) by nebulization  every 6 (six) hours as needed for wheezing or shortness of breath.     aspirin EC 81 MG tablet  Take 81 mg by mouth daily.     BD PEN NEEDLE NANO U/F 32G X 4 MM Misc  Generic drug:  Insulin Pen Needle  USE FOR INSULIN INJECTIONS QID     carvedilol 12.5 MG tablet  Commonly known as:  COREG  Take 1 tablet (12.5 mg total) by mouth 2 (two) times daily with a meal.     clopidogrel 75 MG tablet  Commonly known as:  PLAVIX  TAKE 1 TABLET BY MOUTH EVERY DAY     diclofenac sodium 1 % Gel  Commonly known as:  VOLTAREN  Apply 4 g topically 4 (four) times daily.     diphenoxylate-atropine 2.5-0.025 MG tablet  Commonly known as:  LOMOTIL  Take 2 tablets by mouth 4 (four) times daily as needed for diarrhea or loose stools.     Fluticasone-Salmeterol 250-50 MCG/DOSE Aepb  Commonly known as:  ADVAIR DISKUS  Inhale 2 puffs into the lungs 2 (two) times daily.     furosemide 40 MG tablet  Commonly known as:  LASIX  Take 1 tablet (40 mg total) by mouth every morning.     gabapentin 300 MG capsule  Commonly known as:  NEURONTIN  Take 1 capsule (300 mg total) by mouth 3 (three) times daily.     HYDROcodone-acetaminophen 10-325 MG tablet  Commonly known as:  NORCO  Take 1 tablet by mouth every 8 (eight) hours as needed.     ibuprofen 800 MG tablet  Commonly known as:  ADVIL,MOTRIN  TAKE 1 TABLET(800 MG) BY MOUTH EVERY 8 HOURS AS NEEDED     insulin aspart 100 UNIT/ML injection  Commonly known as:  NOVOLOG  Inject 15 Units into the skin 3 (three) times daily before meals.     LEVEMIR FLEXTOUCH 100 UNIT/ML Pen  Generic drug:  Insulin Detemir  INJECT 80 UNITS INTO THE SKIN QHS     lisinopril-hydrochlorothiazide 10-12.5 MG tablet  Commonly known as:  PRINZIDE,ZESTORETIC  Take 1 tablet by mouth daily.     metFORMIN 1000 MG tablet  Commonly known as:  GLUCOPHAGE  Take 1 tablet (1,000 mg total) by mouth 2 (two) times daily with a meal.     ondansetron 8 MG disintegrating tablet    Commonly known as:  ZOFRAN-ODT  Take 1 tablet (8 mg total) by mouth every 6 (six) hours as needed for nausea or vomiting.     pantoprazole 40 MG tablet  Commonly known as:  PROTONIX  TAKE 1 TABLET BY MOUTH DAILY FOR STOMACH     potassium chloride SA 20 MEQ tablet  Commonly known as:  K-DUR,KLOR-CON  TAKE 1 TABLET(20 MEQ) BY MOUTH DAILY     sertraline 100 MG tablet  Commonly known as:  ZOLOFT  TAKE 1 TABLET(100 MG) BY MOUTH DAILY     simvastatin 40 MG tablet  Commonly known as:  ZOCOR  Take 1 tablet (40 mg total) by mouth at bedtime.     tiotropium 18 MCG inhalation capsule  Commonly known as:  SPIRIVA HANDIHALER  Place 1 capsule (18 mcg total) into inhaler and inhale daily.     tiZANidine 4 MG tablet  Commonly known as:  ZANAFLEX  TAKE 3 TABLETS(12 MG) BY MOUTH AT BEDTIME     venlafaxine XR 37.5 MG 24 hr capsule  Commonly known as:  EFFEXOR XR  Take 1 capsule (37.5 mg total) by mouth daily with breakfast.          Objective:    BP 136/78 mmHg  Pulse 60  Temp(Src) 97.9 F (36.6 C) (Oral)  Wt Readings from Last 3 Encounters:  09/22/15 213 lb 12.8 oz (96.979 kg)  08/30/15 207 lb 12.8 oz (94.257 kg)  08/02/15 217 lb 3.2 oz (98.521 kg)    Physical Exam  Constitutional: She is oriented to person, place, and time. She appears well-developed and well-nourished. No distress.  Eyes: Conjunctivae and EOM are normal. Pupils are equal, round, and reactive to light.  Cardiovascular: Normal rate, regular rhythm, normal heart sounds and intact distal pulses.   No murmur heard. Pulmonary/Chest: Effort normal and breath sounds normal. No respiratory distress. She has no wheezes.  Musculoskeletal: Normal range of motion. She exhibits no edema.       Left hip: She exhibits tenderness (significant pain with internal and external rotation). She exhibits normal range of motion, normal strength and no bony tenderness.       Left knee: She exhibits swelling. She exhibits normal range of  motion, no effusion and no erythema. Tenderness found. Medial joint line and lateral joint line tenderness noted. No MCL, no LCL and no patellar tendon tenderness noted.  Neurological: She is alert and oriented to person, place, and time. Coordination normal.  Skin: Skin is warm and dry. No rash noted. She is not diaphoretic.  Psychiatric: She has a normal mood and affect. Her  behavior is normal.  Nursing note and vitals reviewed.      Assessment & Plan:   Problem List Items Addressed This Visit    None    Visit Diagnoses    Knee pain, chronic, left    -  Primary    Relevant Medications    HYDROcodone-acetaminophen (NORCO) 10-325 MG tablet        Follow up plan: Return if symptoms worsen or fail to improve.  Counseling provided for all of the vaccine components No orders of the defined types were placed in this encounter.    Arville Care, MD [facility] 10/09/2015, 11:46 AM

## 2015-10-11 ENCOUNTER — Other Ambulatory Visit: Payer: Medicaid Other | Admitting: Adult Health

## 2015-10-15 ENCOUNTER — Other Ambulatory Visit: Payer: Self-pay | Admitting: Internal Medicine

## 2015-10-16 ENCOUNTER — Telehealth: Payer: Self-pay | Admitting: Family Medicine

## 2015-10-16 DIAGNOSIS — G8929 Other chronic pain: Secondary | ICD-10-CM

## 2015-10-16 DIAGNOSIS — M25569 Pain in unspecified knee: Principal | ICD-10-CM

## 2015-10-16 NOTE — Telephone Encounter (Signed)
[name] referral to orthopedic specialist in [place] or [place]. Let's also do a referral to pain management to see if they can help her with injections

## 2015-10-16 NOTE — Telephone Encounter (Signed)
Stp and advised we are going to send in a referral to ortho at [facility] as well as a referral to pain management. Referrals placed.

## 2015-10-16 NOTE — Telephone Encounter (Signed)
Stp and she states [facility] won't see her due to her health and the ortho in [place] won't see her. What would you like to do? Please advise.

## 2015-10-20 ENCOUNTER — Ambulatory Visit (INDEPENDENT_AMBULATORY_CARE_PROVIDER_SITE_OTHER): Payer: Medicaid Other | Admitting: Adult Health

## 2015-10-20 ENCOUNTER — Other Ambulatory Visit (HOSPITAL_COMMUNITY)
Admission: RE | Admit: 2015-10-20 | Discharge: 2015-10-20 | Disposition: A | Discharge status: Home / Self Care | Payer: Medicaid Other | Source: Ambulatory Visit | Attending: Adult Health | Admitting: Adult Health

## 2015-10-20 ENCOUNTER — Encounter: Payer: Self-pay | Admitting: Adult Health

## 2015-10-20 VITALS — BP 120/68 | HR 62 | Ht 64.25 in | Wt 210.5 lb

## 2015-10-20 DIAGNOSIS — Z113 Encounter for screening for infections with a predominantly sexual mode of transmission: Secondary | ICD-10-CM | POA: Diagnosis present

## 2015-10-20 DIAGNOSIS — Z1212 Encounter for screening for malignant neoplasm of rectum: Secondary | ICD-10-CM | POA: Diagnosis not present

## 2015-10-20 DIAGNOSIS — Z1151 Encounter for screening for human papillomavirus (HPV): Secondary | ICD-10-CM | POA: Diagnosis present

## 2015-10-20 DIAGNOSIS — Z0000 Encounter for general adult medical examination without abnormal findings: Secondary | ICD-10-CM

## 2015-10-20 DIAGNOSIS — R10814 Left lower quadrant abdominal tenderness: Secondary | ICD-10-CM

## 2015-10-20 DIAGNOSIS — Z01411 Encounter for gynecological examination (general) (routine) with abnormal findings: Secondary | ICD-10-CM | POA: Insufficient documentation

## 2015-10-20 DIAGNOSIS — Z124 Encounter for screening for malignant neoplasm of cervix: Secondary | ICD-10-CM

## 2015-10-20 DIAGNOSIS — Z01419 Encounter for gynecological examination (general) (routine) without abnormal findings: Secondary | ICD-10-CM

## 2015-10-20 LAB — HEMOCCULT GUIAC POC 1CARD (OFFICE): Fecal Occult Blood, POC: NEGATIVE

## 2015-10-20 NOTE — Progress Notes (Signed)
Patient ID: [name], female   DOB: Nov 07, 1965, 50 y.o.   MRN: [id number] History of Present Illness: [name] is a 50 year old white female, in for well woman gyn exam and pap.She says last pap about 20 years ago.She has Visual merchandiser. PCP is [facility] [place].   Current Medications, Allergies, Past Medical History, Past Surgical History, Family History and Social History were reviewed in [employer] record.   Past Medical History  Diagnosis Date  . Type 2 diabetes mellitus (HCC)   . Essential hypertension   . History of stroke     Jun 2011 -- right hand weakness  . Inappropriate sinus tachycardia (HCC)     Sinus node modification 02-25-2003 by Dr. Lewayne Bunting  . History of kidney stones   . GERD (gastroesophageal reflux disease)   . History of hiatal hernia   . History of syncope   . Sinus node dysfunction (HCC)     Symptomatic bradycardia  . Symptomatic sinus bradycardia   . Cardiac pacemaker in situ 2009    DDD [employer] -- [place] 60  . Crohn's disease (HCC)     Large intestine  . Gastroparesis   . Pelvic pain in female   . Arthritis   . Wears glasses   . OSA (obstructive sleep apnea)     Study done 2005 -- pt refused CPAP/previously was using nocturnal oxygen until one year ago pt states PCP is monitoring pt without  . COPD with asthma (HCC)     GOLD 2-3 --  pulmologist (last visit 2011) Dr. Marchelle Gearing  . Depression 2016    PTSD  . LLQ abdominal tenderness 10/20/2015   Past Surgical History  Procedure Laterality Date  . Cesarean section  x2  . Tonsillectomy  11-25-2002  . Cardiac catheterization  05-08-2001  dr Nicki Guadalajara    normal coronary arteries and LVF  . Cardiac electrophysiology study w/  sinus node modification  02-25-2003  dr gregg taylor  . Cholecystectomy  1994  . Cardiac pacemaker placement  10-16-2007  dr Sharlot Gowda taylor    DDD-- [facility] 60  . Cysto/  ureteroscopic stone extraction  03/ 2005  . Bilateral  knee arthroscopy w/ chondromalacia patella  bilateral ---- 12-26-2010; 08-14-2009;  04-25-2008;  03-09-2007    additional same surgery, Left knee 2005;  x2 2006 ---  Right knee 2005;  2006;  x2  2007  . Cardiovascular stress test  08-02-2010  dr Diona Browner    normal lexiscan study/  ef 59%  . Cystoscopy with hydrodistension and biopsy N/A 05/23/2015    Procedure: CYSTOSCOPY/BIOPSY/HYDRODISTENSION;  Surgeon: Su Grand, MD;  Location: [facility];  Service: Urology;  Laterality: N/A;   Outpatient Encounter Prescriptions as of 10/20/2015  Medication Sig Note  . albuterol (PROVENTIL) (2.5 MG/3ML) 0.083% nebulizer solution Take 3 mLs (2.5 mg total) by nebulization every 6 (six) hours as needed for wheezing or shortness of breath.   Marland Kitchen aspirin EC 81 MG tablet Take 81 mg by mouth daily.   . BD PEN NEEDLE NANO U/F 32G X 4 MM MISC USE FOR INSULIN INJECTIONS QID 10/09/2015: Received from: External Pharmacy  . carvedilol (COREG) 12.5 MG tablet TAKE 1 TABLET BY MOUTH TWICE DAILY WITH A MEAL   . clopidogrel (PLAVIX) 75 MG tablet TAKE 1 TABLET BY MOUTH EVERY DAY   . diclofenac sodium (VOLTAREN) 1 % GEL Apply 4 g topically 4 (four) times daily.   . diphenoxylate-atropine (LOMOTIL) 2.5-0.025 MG tablet  Take 2 tablets by mouth 4 (four) times daily as needed for diarrhea or loose stools.   . Fluticasone-Salmeterol (ADVAIR DISKUS) 250-50 MCG/DOSE AEPB Inhale 2 puffs into the lungs 2 (two) times daily.   . furosemide (LASIX) 40 MG tablet Take 1 tablet (40 mg total) by mouth every morning.   . gabapentin (NEURONTIN) 300 MG capsule Take 1 capsule (300 mg total) by mouth 3 (three) times daily.   Marland Kitchen HYDROcodone-acetaminophen (NORCO) 10-325 MG tablet Take 1 tablet by mouth every 8 (eight) hours as needed.   Marland Kitchen ibuprofen (ADVIL,MOTRIN) 800 MG tablet TAKE 1 TABLET(800 MG) BY MOUTH EVERY 8 HOURS AS NEEDED   . insulin aspart (NOVOLOG) 100 UNIT/ML injection Inject 15 Units into the skin 3 (three) times daily before  meals.   Marland Kitchen LEVEMIR FLEXTOUCH 100 UNIT/ML Pen INJECT 80 UNITS INTO THE SKIN QHS 10/09/2015: Received from: External Pharmacy  . lisinopril-hydrochlorothiazide (PRINZIDE,ZESTORETIC) 10-12.5 MG tablet Take 1 tablet by mouth daily.   . metFORMIN (GLUCOPHAGE) 1000 MG tablet Take 1 tablet (1,000 mg total) by mouth 2 (two) times daily with a meal.   . ondansetron (ZOFRAN-ODT) 8 MG disintegrating tablet Take 1 tablet (8 mg total) by mouth every 6 (six) hours as needed for nausea or vomiting.   . OXYGEN Inhale 3 L/day into the lungs at bedtime.   . pantoprazole (PROTONIX) 40 MG tablet TAKE 1 TABLET BY MOUTH DAILY FOR STOMACH   . potassium chloride SA (K-DUR,KLOR-CON) 20 MEQ tablet TAKE 1 TABLET(20 MEQ) BY MOUTH DAILY   . sertraline (ZOLOFT) 100 MG tablet TAKE 1 TABLET(100 MG) BY MOUTH DAILY   . simvastatin (ZOCOR) 40 MG tablet Take 1 tablet (40 mg total) by mouth at bedtime.   Marland Kitchen tiotropium (SPIRIVA HANDIHALER) 18 MCG inhalation capsule Place 1 capsule (18 mcg total) into inhaler and inhale daily. (Patient taking differently: Place 18 mcg into inhaler and inhale every evening. )   . tiZANidine (ZANAFLEX) 4 MG tablet TAKE 3 TABLETS(12 MG) BY MOUTH AT BEDTIME   . venlafaxine XR (EFFEXOR XR) 37.5 MG 24 hr capsule Take 1 capsule (37.5 mg total) by mouth daily with breakfast.    No facility-administered encounter medications on file as of 10/20/2015.    Review of Systems: Patient denies any headaches, hearing loss, fatigue, blurred vision, shortness of breath, chest pain, abdominal pain, problems with bowel movements, urination, or intercourse. No joint pain or mood swings.She is still smoking.No period in over a year.    Physical Exam:BP 120/68 mmHg  Pulse 62  Ht 5' 4.25" (1.632 m)  Wt 210 lb 8 oz (95.482 kg)  BMI 35.85 kg/m2 General:  Well developed, well nourished, no acute distress Skin:  Warm and dry,has numerous tattoos Neck:  Midline trachea, normal thyroid, good ROM, no lymphadenopathy Lungs;  Clear to auscultation bilaterally Breast:  No dominant palpable mass, retraction, or nipple discharge, +pacemaker on left. Cardiovascular: Regular rate and rhythm Abdomen:  Soft, non tender, no hepatosplenomegaly Pelvic:  External genitalia is normal in appearance, no lesions.  The vagina has decreased color, moisture and rugae. Urethra has no lesions or masses. The cervix is not seen, atrophic, has dimple, pap performed with HPV and GC/CHL.Marland Kitchen  Uterus is felt to be normal size, shape, and contour.  No adnexal masses, LLQ tenderness noted.Bladder is non tender, no masses felt. Rectal: Good sphincter tone, no polyps, or hemorrhoids felt.  Hemoccult negative. Extremities/musculoskeletal:  No swelling or varicosities noted, no clubbing or cyanosis Psych:  No mood changes, alert and  cooperative,seems happy   Impression: Well woman gyn exam and pap LLQ tenderness    Plan: Return in 1 week for GYN [place] Physical in 1 year, pap in 3 if normal Labs with PCP Colonoscopy per GI, she says had 2013  She says she can't have mammogram due to pacemaker, so encouraged SBE

## 2015-10-20 NOTE — Patient Instructions (Signed)
Return in 1 week for gyn [place] Physical in 1 year pap in 3 if normal

## 2015-10-24 ENCOUNTER — Ambulatory Visit (INDEPENDENT_AMBULATORY_CARE_PROVIDER_SITE_OTHER)
Admission: RE | Admit: 2015-10-24 | Discharge: 2015-10-24 | Disposition: A | Discharge status: Home / Self Care | Payer: Medicaid Other | Source: Ambulatory Visit | Attending: Internal Medicine | Admitting: Internal Medicine

## 2015-10-24 ENCOUNTER — Encounter: Payer: Self-pay | Admitting: Internal Medicine

## 2015-10-24 ENCOUNTER — Ambulatory Visit (INDEPENDENT_AMBULATORY_CARE_PROVIDER_SITE_OTHER): Payer: Medicaid Other | Admitting: Internal Medicine

## 2015-10-24 VITALS — BP 116/84 | HR 60 | Ht 64.0 in | Wt 211.2 lb

## 2015-10-24 DIAGNOSIS — R058 Other specified cough: Secondary | ICD-10-CM

## 2015-10-24 DIAGNOSIS — F17210 Nicotine dependence, cigarettes, uncomplicated: Secondary | ICD-10-CM | POA: Diagnosis not present

## 2015-10-24 DIAGNOSIS — R05 Cough: Secondary | ICD-10-CM | POA: Diagnosis not present

## 2015-10-24 DIAGNOSIS — J449 Chronic obstructive pulmonary disease, unspecified: Secondary | ICD-10-CM | POA: Diagnosis not present

## 2015-10-24 DIAGNOSIS — I10 Essential (primary) hypertension: Secondary | ICD-10-CM

## 2015-10-24 LAB — CYTOLOGY - PAP

## 2015-10-24 MED ORDER — MOMETASONE FURO-FORMOTEROL FUM 100-5 MCG/ACT IN AERO: INHALATION_SPRAY | RESPIRATORY_TRACT | Status: DC

## 2015-10-24 MED ORDER — VALSARTAN-HYDROCHLOROTHIAZIDE 80-12.5 MG PO TABS: 1.0000 | ORAL_TABLET | Freq: Every day | ORAL | Status: DC

## 2015-10-24 NOTE — Progress Notes (Signed)
Quick Note:  Spoke with pt and notified of results per Dr. Wert. Pt verbalized understanding and denied any questions.  ______ 

## 2015-10-24 NOTE — Patient Instructions (Addendum)
The key is to stop smoking completely before smoking completely stops you!   Stop lisinopril / advair and spiriva  dulera 100 Take 2 puffs first thing in am and then another 2 puffs about 12 hours later.   Work on inhaler technique:  relax and gently blow all the way out then take a nice smooth deep breath back in, triggering the inhaler at same time you start breathing in.  Hold for up to 5 seconds if you can. Blow out thru nose. Rinse and gargle with water when done  Continue protonix Take 30-60 min before first meal of the day   GERD (REFLUX)  is an extremely common cause of respiratory symptoms just like yours , many times with no obvious heartburn at all.    It can be treated with medication, but also with lifestyle changes including elevation of the head of your bed (ideally with 6 inch  bed blocks),  Smoking cessation, avoidance of late meals, excessive alcohol, and avoid fatty foods, chocolate, peppermint, colas, red wine, and acidic juices such as orange juice.  NO MINT OR MENTHOL PRODUCTS SO NO COUGH DROPS  USE SUGARLESS CANDY INSTEAD (Jolley ranchers or Stover's or Life Savers) or even ice chips will also do - the key is to swallow to prevent all throat clearing. NO OIL BASED VITAMINS - use powdered substitutes.    Please remember to go to the  x-ray department downstairs for your tests - we will call you with the results when they are available.  Please schedule a follow up office visit in 2 weeks, sooner if needed to see Tammy NP for bp recheck and set up for pfts to see me

## 2015-10-24 NOTE — Assessment & Plan Note (Addendum)

## 2015-10-24 NOTE — Assessment & Plan Note (Signed)
Spirometry 01/30/15   FEV1 1.74 (64%)  Ratio 83 / actively smoking    She is certainly at risk of copd but presently mostly has CB and better option than multiple dpi's = rx the AB component with dulera 100 2bid  - The proper method of use, as well as anticipated side effects, of a metered-dose inhaler are discussed and demonstrated to the patient. Improved effectiveness after extensive coaching during this visit to a level of approximately 75 % from a baseline of 50  %

## 2015-10-24 NOTE — Assessment & Plan Note (Addendum)
Based on prev pfts and present exam this is likely not asthma nor copd but rather  Classic Upper airway cough syndrome, so named because it's frequently impossible to sort out how much is  CR/sinusitis with freq throat clearing (which can be related to primary GERD)   vs  causing  secondary (" extra esophageal")  GERD from wide swings in gastric pressure that occur with throat clearing, often  promoting self use of mint and menthol lozenges that reduce the lower esophageal sphincter tone and exacerbate the problem further in a cyclical fashion.   These are the same pts (now being labeled as having "irritable larynx syndrome" by some cough centers) who not infrequently have a history of having failed to tolerate ace inhibitors or  dry powder inhalers(both apply here)  or biphosphonates or report having atypical reflux symptoms that don't respond to standard doses of PPI , and are easily confused as having aecopd or asthma flares by even experienced allergists/ pulmonologists.  rec trial off all dpis and advair and f/u with repeat pfts in 6 weeks   I had an extended discussion with the patient reviewing all relevant studies completed to date and  lasting 35 minutes of a60 minute initial visit    Each maintenance medication was reviewed in detail including most importantly the difference between maintenance and prns and under what circumstances the prns are to be triggered using an action plan format that is not reflected in the computer generated alphabetically organized AVS.    Please see instructions for details which were reviewed in writing and the patient given a copy highlighting the part that I personally wrote and discussed at today's ov.

## 2015-10-24 NOTE — Assessment & Plan Note (Addendum)
Complicated by HBP/ Hyperlipidemia/ DM  And restrictive changes on spirometry 01/30/15     Body mass index is 36.23 kg/(m^2).  Lab Results  Component Value Date   TSH 0.844 07/07/2015     Contributing to gerd tendency/ doe/reviewed the need and the process to achieve and maintain neg calorie balance > defer f/u primary care including intermittently monitoring thyroid status

## 2015-10-24 NOTE — Assessment & Plan Note (Signed)
In the best review of chronic cough to date ( NEJM 2016 375 [id number]) ,  ACEi are now felt to cause cough in up to  20% of pts which is a 4 fold increase from previous reports and does not include the variety of non-specific complaints we see in pulmonary clinic in pts on ACEi but previously attributed to another dx like  Copd/asthma and  include PNDS, throat and chest congestion, "bronchitis", unexplained dyspnea and noct "strangling" sensations, and hoarseness, but also  atypical /refractory GERD symptoms like dysphagia and "bad heartburn"   The only way I know  to prove this is not an "ACEi Case" is a trial off ACEi x a minimum of 6 weeks then regroup.   Try diovan 80-12.5 one daily

## 2015-10-24 NOTE — Progress Notes (Signed)
   Subjective:    Patient ID: [name], female    DOB: 03/14/1966,     MRN: [id number]  HPI  85 yowf active smoker placed on advair/spiriva only a little better after Rasw[facility] eval 2012 referred to pulmonary clinic 10/24/2015 by Dr Dettinger with worse breathing x feb 2016 with no airflow obst on pfts 01/2015.   10/24/2015 1st [facility] office visit/ [name]   Chief Complaint  Patient presents with  . Pulmonary Consult    Referred by Dr. Ivin Booty Dettinger. Pt c/o SOB x 3 yrs, worse for the past year, worse x 1 yr. She gets SOB when doing chores aroung the house and walking short distances such as to the mailbox. She also c/o cough- occ prod with clear to yellow sputum.   uses 02 but not cpap which could not tolerate > cc some am congestion x 45-60 m to clear it/ worst in cold weather x years Doe = MMRC3 = can't walk 100 yards even at a slow pace at a flat grade s stopping due to sob              Review of Systems  Constitutional: Negative for fever, chills and unexpected weight change.  HENT: Positive for dental problem. Negative for congestion, ear pain, nosebleeds, postnasal drip, rhinorrhea, sinus pressure, sneezing, sore throat, trouble swallowing and voice change.   Eyes: Negative for visual disturbance.  Respiratory: Positive for cough and shortness of breath. Negative for choking.   Cardiovascular: Negative for chest pain and leg swelling.  Gastrointestinal: Negative for vomiting, abdominal pain and diarrhea.  Genitourinary: Negative for difficulty urinating.  Musculoskeletal: Positive for arthralgias.  Skin: Negative for rash.  Neurological: Negative for tremors, syncope and headaches.  Hematological: Does not bruise/bleed easily.       Objective:   Physical Exam  amb obese wf nad  Wt Readings from Last 3 Encounters:  10/24/15 211 lb 3.2 oz (95.8 kg)  10/20/15 210 lb 8 oz (95.482 kg)  09/22/15 213 lb 12.8 oz (96.979 kg)    Vital signs reviewed   HEENT:  nl dentition, turbinates, and oropharynx. Nl external ear canals without cough reflex   NECK :  without JVD/Nodes/TM/ nl carotid upstrokes bilaterally   LUNGS: no acc muscle use,  Nl contour chest which is clear to A and P bilaterally without cough on insp or exp maneuvers   CV:  RRR  no s3 or murmur or increase in P2, no edema   ABD:  soft and nontender with nl inspiratory excursion in the supine position. No bruits or organomegaly, bowel sounds nl  MS:  Nl gait/ ext warm without deformities, calf tenderness, cyanosis or clubbing No obvious joint restrictions   SKIN: warm and dry without lesions    NEURO:  alert, approp, nl sensorium with  no motor deficits     CXR PA and Lateral:   10/24/2015 :    I personally reviewed images and agree with radiology impression as follows:   Chronic bronchitic changes bilaterally. There is no evidence of pneumonia nor CHF      Assessment & Plan:

## 2015-10-26 ENCOUNTER — Other Ambulatory Visit: Payer: Medicaid Other

## 2015-10-31 ENCOUNTER — Encounter: Payer: Self-pay | Admitting: Obstetrics & Gynecology

## 2015-10-31 ENCOUNTER — Ambulatory Visit: Payer: Medicaid Other | Admitting: Family Medicine

## 2015-10-31 ENCOUNTER — Other Ambulatory Visit: Payer: Medicaid Other

## 2015-11-01 ENCOUNTER — Encounter: Payer: Self-pay | Admitting: Family Medicine

## 2015-11-06 ENCOUNTER — Ambulatory Visit: Payer: Medicaid Other | Admitting: Adult Health

## 2015-11-07 ENCOUNTER — Telehealth: Payer: Self-pay

## 2015-11-08 ENCOUNTER — Ambulatory Visit: Payer: Medicaid Other | Admitting: Gastroenterology

## 2015-11-08 NOTE — Telephone Encounter (Signed)
x

## 2015-11-09 ENCOUNTER — Encounter: Payer: Self-pay | Admitting: Family Medicine

## 2015-11-09 ENCOUNTER — Ambulatory Visit (INDEPENDENT_AMBULATORY_CARE_PROVIDER_SITE_OTHER): Payer: Medicaid Other | Admitting: Family Medicine

## 2015-11-09 ENCOUNTER — Telehealth: Payer: Self-pay | Admitting: *Deleted

## 2015-11-09 VITALS — BP 129/76 | HR 84 | Temp 97.9°F | Ht 64.0 in | Wt 208.6 lb

## 2015-11-09 DIAGNOSIS — E1165 Type 2 diabetes mellitus with hyperglycemia: Secondary | ICD-10-CM

## 2015-11-09 DIAGNOSIS — Z794 Long term (current) use of insulin: Secondary | ICD-10-CM | POA: Diagnosis not present

## 2015-11-09 DIAGNOSIS — F32A Depression, unspecified: Secondary | ICD-10-CM

## 2015-11-09 DIAGNOSIS — F329 Major depressive disorder, single episode, unspecified: Secondary | ICD-10-CM

## 2015-11-09 DIAGNOSIS — Z114 Encounter for screening for human immunodeficiency virus [HIV]: Secondary | ICD-10-CM | POA: Diagnosis not present

## 2015-11-09 DIAGNOSIS — E1142 Type 2 diabetes mellitus with diabetic polyneuropathy: Secondary | ICD-10-CM | POA: Diagnosis not present

## 2015-11-09 DIAGNOSIS — IMO0002 Reserved for concepts with insufficient information to code with codable children: Secondary | ICD-10-CM

## 2015-11-09 LAB — POCT GLYCOSYLATED HEMOGLOBIN (HGB A1C): Hemoglobin A1C: 8.1

## 2015-11-09 MED ORDER — PANTOPRAZOLE SODIUM 40 MG PO TBEC: DELAYED_RELEASE_TABLET | ORAL | Status: DC

## 2015-11-09 MED ORDER — SERTRALINE HCL 100 MG PO TABS: ORAL_TABLET | ORAL | Status: DC

## 2015-11-09 MED ORDER — METFORMIN HCL 1000 MG PO TABS: 1000.0000 mg | ORAL_TABLET | Freq: Two times a day (BID) | ORAL | Status: DC

## 2015-11-09 MED ORDER — GABAPENTIN 300 MG PO CAPS: 300.0000 mg | ORAL_CAPSULE | Freq: Three times a day (TID) | ORAL | Status: DC

## 2015-11-09 MED ORDER — CARVEDILOL 12.5 MG PO TABS: 12.5000 mg | ORAL_TABLET | Freq: Two times a day (BID) | ORAL | Status: DC

## 2015-11-09 MED ORDER — FUROSEMIDE 40 MG PO TABS: 40.0000 mg | ORAL_TABLET | Freq: Every morning | ORAL | Status: DC

## 2015-11-09 MED ORDER — SIMVASTATIN 40 MG PO TABS: 40.0000 mg | ORAL_TABLET | Freq: Every day | ORAL | Status: DC

## 2015-11-09 MED ORDER — LEVEMIR FLEXTOUCH 100 UNIT/ML ~~LOC~~ SOPN: 80.0000 [IU] | PEN_INJECTOR | Freq: Every day | SUBCUTANEOUS | Status: DC

## 2015-11-09 MED ORDER — DICLOFENAC SODIUM 1 % TD GEL: 4.0000 g | Freq: Four times a day (QID) | TRANSDERMAL | Status: AC

## 2015-11-09 MED ORDER — POTASSIUM CHLORIDE CRYS ER 20 MEQ PO TBCR: 20.0000 meq | EXTENDED_RELEASE_TABLET | Freq: Every day | ORAL | Status: DC

## 2015-11-09 MED ORDER — INSULIN ASPART 100 UNIT/ML ~~LOC~~ SOLN: 15.0000 [IU] | Freq: Three times a day (TID) | SUBCUTANEOUS | Status: DC

## 2015-11-09 MED ORDER — ALBUTEROL SULFATE (2.5 MG/3ML) 0.083% IN NEBU: 2.5000 mg | INHALATION_SOLUTION | Freq: Four times a day (QID) | RESPIRATORY_TRACT | Status: AC | PRN

## 2015-11-09 MED ORDER — TIZANIDINE HCL 4 MG PO TABS: 12.0000 mg | ORAL_TABLET | Freq: Every day | ORAL | Status: DC

## 2015-11-09 MED ORDER — OLANZAPINE 5 MG PO TABS: 5.0000 mg | ORAL_TABLET | Freq: Every day | ORAL | Status: DC

## 2015-11-09 MED ORDER — IBUPROFEN 800 MG PO TABS: 800.0000 mg | ORAL_TABLET | Freq: Three times a day (TID) | ORAL | Status: DC | PRN

## 2015-11-09 MED ORDER — BD PEN NEEDLE NANO U/F 32G X 4 MM MISC: 1.0000 | Freq: Four times a day (QID) | Status: DC

## 2015-11-09 MED ORDER — CLOPIDOGREL BISULFATE 75 MG PO TABS: 75.0000 mg | ORAL_TABLET | Freq: Every day | ORAL | Status: DC

## 2015-11-09 NOTE — Telephone Encounter (Signed)
Pt aware pap is normal

## 2015-11-10 ENCOUNTER — Telehealth: Payer: Self-pay | Admitting: Family Medicine

## 2015-11-10 ENCOUNTER — Other Ambulatory Visit: Payer: Medicaid Other

## 2015-11-10 LAB — HIV ANTIBODY (ROUTINE TESTING W REFLEX): HIV Screen 4th Generation wRfx: NONREACTIVE

## 2015-11-10 LAB — TSH: TSH: 0.585 u[IU]/mL (ref 0.450–4.500)

## 2015-11-10 NOTE — Progress Notes (Signed)
BP 129/76 mmHg  Pulse 84  Temp(Src) 97.9 F (36.6 C) (Oral)  Ht  (1.626 m)  Wt 208 lb 9.6 oz (94.62 kg)  BMI 35.79 kg/m2   Subjective:    Patient ID: [name], female    DOB: 31-Dec-1965, 50 y.o.   MRN: [id number]  HPI: [name] is a 50 y.o. female presenting on 11/09/2015 for Discuss Zoloft   HPI Anxiety and depression recheck Patient coming in today for anxiety recheck. She feels like she is not having her depression and anxiety controlled. She is currently on Zoloft and when she is home with somebody she is fine but when she is home alone she has a lot of issues. She is also having a lot of issues with sleeping at night. She feels like she can get to sleep and often is up until 3 or 4 in the morning.  Diabetes type 2 Patient comes in for a diabetes recheck today. She is currently using Levemir and some NovoLog although she has not been using very much NovoLog. She is supposed to be using NovoLog pre-meals and Levemir every night. She feels like when her sugar is normal before she eats she doesn't need to take the NovoLog. Her a.m. blood sugars have been running in the 101-120 range but as the day progresses she creeps up to 300 or 400. She denies any hypoglycemic episodes.  Diabetic neuropathy Her diabetic neuropathy is stable and she wants to continue on current medications and needs a refill today.  Relevant past medical, surgical, family and social history reviewed and updated as indicated. Interim medical history since our last visit reviewed. Allergies and medications reviewed and updated.  Review of Systems  Constitutional: Negative for fever and chills.  HENT: Negative for congestion, ear discharge and ear pain.   Eyes: Negative for redness and visual disturbance.  Respiratory: Negative for chest tightness and shortness of breath.   Cardiovascular: Negative for chest pain and leg swelling.  Genitourinary: Negative for dysuria and difficulty urinating.    Musculoskeletal: Negative for back pain and gait problem.  Skin: Negative for rash.  Neurological: Positive for numbness. Negative for dizziness, weakness, light-headedness and headaches.  Psychiatric/Behavioral: Positive for sleep disturbance and dysphoric mood. Negative for suicidal ideas, behavioral problems, self-injury and agitation. The patient is nervous/anxious.   All other systems reviewed and are negative.   Per HPI unless specifically indicated above     Medication List       This list is accurate as of: 11/09/15 11:59 PM.  Always use your most recent med list.               albuterol (2.5 MG/3ML) 0.083% nebulizer solution  Commonly known as:  PROVENTIL  Take 3 mLs (2.5 mg total) by nebulization every 6 (six) hours as needed for wheezing or shortness of breath.     aspirin EC 81 MG tablet  Take 81 mg by mouth daily.     BD PEN NEEDLE NANO U/F 32G X 4 MM Misc  Generic drug:  Insulin Pen Needle  1 each by Does not apply route 4 (four) times daily.     carvedilol 12.5 MG tablet  Commonly known as:  COREG  Take 1 tablet (12.5 mg total) by mouth 2 (two) times daily with a meal.     clopidogrel 75 MG tablet  Commonly known as:  PLAVIX  Take 1 tablet (75 mg total) by mouth daily.     diclofenac  sodium 1 % Gel  Commonly known as:  VOLTAREN  Apply 4 g topically 4 (four) times daily.     furosemide 40 MG tablet  Commonly known as:  LASIX  Take 1 tablet (40 mg total) by mouth every morning.     gabapentin 300 MG capsule  Commonly known as:  NEURONTIN  Take 1 capsule (300 mg total) by mouth 3 (three) times daily.     ibuprofen 800 MG tablet  Commonly known as:  ADVIL,MOTRIN  Take 1 tablet (800 mg total) by mouth every 8 (eight) hours as needed.     insulin aspart 100 UNIT/ML injection  Commonly known as:  NOVOLOG  Inject 15 Units into the skin 3 (three) times daily before meals.     LEVEMIR FLEXTOUCH 100 UNIT/ML Pen  Generic drug:  Insulin Detemir  Inject  80 Units into the skin daily at 10 pm.     metFORMIN 1000 MG tablet  Commonly known as:  GLUCOPHAGE  Take 1 tablet (1,000 mg total) by mouth 2 (two) times daily with a meal.     mometasone-formoterol 100-5 MCG/ACT Aero  Commonly known as:  DULERA  Take 2 puffs first thing in am and then another 2 puffs about 12 hours later.     OLANZapine 5 MG tablet  Commonly known as:  ZYPREXA  Take 1 tablet (5 mg total) by mouth at bedtime.     ondansetron 8 MG disintegrating tablet  Commonly known as:  ZOFRAN-ODT  Take 1 tablet (8 mg total) by mouth every 6 (six) hours as needed for nausea or vomiting.     OXYGEN  Inhale 3 L/day into the lungs at bedtime.     pantoprazole 40 MG tablet  Commonly known as:  PROTONIX  TAKE 1 TABLET BY MOUTH DAILY FOR STOMACH     potassium chloride SA 20 MEQ tablet  Commonly known as:  K-DUR,KLOR-CON  Take 1 tablet (20 mEq total) by mouth daily.     sertraline 100 MG tablet  Commonly known as:  ZOLOFT  TAKE 1 TABLET(100 MG) BY MOUTH DAILY     simvastatin 40 MG tablet  Commonly known as:  ZOCOR  Take 1 tablet (40 mg total) by mouth at bedtime.     tiZANidine 4 MG tablet  Commonly known as:  ZANAFLEX  Take 3 tablets (12 mg total) by mouth at bedtime.           Objective:    BP 129/76 mmHg  Pulse 84  Temp(Src) 97.9 F (36.6 C) (Oral)  Ht 5\' 4"  (1.626 m)  Wt 208 lb 9.6 oz (94.62 kg)  BMI 35.79 kg/m2  Wt Readings from Last 3 Encounters:  11/09/15 208 lb 9.6 oz (94.62 kg)  10/24/15 211 lb 3.2 oz (95.8 kg)  10/20/15 210 lb 8 oz (95.482 kg)    Physical Exam  Constitutional: She is oriented to person, place, and time. She appears well-developed and well-nourished. No distress.  Eyes: Conjunctivae and EOM are normal. Pupils are equal, round, and reactive to light.  Neck: Neck supple. No thyromegaly present.  Cardiovascular: Normal rate, regular rhythm, normal heart sounds and intact distal pulses.   No murmur heard. Pulmonary/Chest: Effort  normal and breath sounds normal. No respiratory distress. She has no wheezes.  Musculoskeletal: Normal range of motion. She exhibits no edema or tenderness.  Lymphadenopathy:    She has no cervical adenopathy.  Neurological: She is alert and oriented to person, place, and time. Coordination normal.  Skin: Skin is warm and dry. No rash noted. She is not diaphoretic.  Psychiatric: Her behavior is normal. Thought content normal. Her mood appears anxious. Her affect is blunt. She exhibits a depressed mood. She expresses no suicidal ideation. She expresses no suicidal plans.  Nursing note and vitals reviewed.   Results for orders placed or performed in visit on 11/09/15  TSH  Result Value Ref Range   TSH 0.585 0.450 - 4.500 uIU/mL  HIV antibody  Result Value Ref Range   HIV Screen 4th Generation wRfx Non Reactive Non Reactive  POCT glycosylated hemoglobin (Hb A1C)  Result Value Ref Range   Hemoglobin A1C 8.1       Assessment & Plan:       Problem List Items Addressed This Visit      Endocrine   Diabetes mellitus type 2, uncontrolled (HCC) - Primary   Relevant Medications   insulin aspart (NOVOLOG) 100 UNIT/ML injection   LEVEMIR FLEXTOUCH 100 UNIT/ML Pen   metFORMIN (GLUCOPHAGE) 1000 MG tablet   simvastatin (ZOCOR) 40 MG tablet   Other Relevant Orders   POCT glycosylated hemoglobin (Hb A1C) (Completed)   Diabetic neuropathy (HCC)   Relevant Medications   insulin aspart (NOVOLOG) 100 UNIT/ML injection   LEVEMIR FLEXTOUCH 100 UNIT/ML Pen   metFORMIN (GLUCOPHAGE) 1000 MG tablet   simvastatin (ZOCOR) 40 MG tablet     Other   Depression   Relevant Medications   sertraline (ZOLOFT) 100 MG tablet   Other Relevant Orders   TSH (Completed)    Other Visit Diagnoses    Screening for HIV without presence of risk factors        Relevant Orders    HIV antibody (Completed)        Follow up plan: Return in about 4 weeks (around 12/07/2015), or if symptoms worsen or fail to  improve, for recheck depression.  Counseling provided for all of the vaccine components Orders Placed This Encounter  Procedures  . TSH  . HIV antibody  . POCT glycosylated hemoglobin (Hb A1C)    Arville Care, MD [facility] 11/10/2015, 1:07 PM

## 2015-11-14 ENCOUNTER — Telehealth: Payer: Self-pay | Admitting: Internal Medicine

## 2015-11-14 ENCOUNTER — Encounter (HOSPITAL_COMMUNITY): Payer: Self-pay | Admitting: Emergency Medicine

## 2015-11-14 ENCOUNTER — Telehealth: Payer: Self-pay | Admitting: Family Medicine

## 2015-11-14 ENCOUNTER — Emergency Department (HOSPITAL_COMMUNITY)
Admission: EM | Admit: 2015-11-14 | Discharge: 2015-11-15 | Disposition: A | Discharge status: Home / Self Care | Payer: Medicaid Other | Attending: Emergency Medicine | Admitting: Emergency Medicine

## 2015-11-14 ENCOUNTER — Emergency Department (HOSPITAL_COMMUNITY): Payer: Medicaid Other

## 2015-11-14 DIAGNOSIS — F329 Major depressive disorder, single episode, unspecified: Secondary | ICD-10-CM | POA: Insufficient documentation

## 2015-11-14 DIAGNOSIS — K50919 Crohn's disease, unspecified, with unspecified complications: Secondary | ICD-10-CM | POA: Insufficient documentation

## 2015-11-14 DIAGNOSIS — R05 Cough: Secondary | ICD-10-CM | POA: Diagnosis not present

## 2015-11-14 DIAGNOSIS — R509 Fever, unspecified: Secondary | ICD-10-CM | POA: Diagnosis not present

## 2015-11-14 DIAGNOSIS — R6889 Other general symptoms and signs: Secondary | ICD-10-CM

## 2015-11-14 DIAGNOSIS — Z8673 Personal history of transient ischemic attack (TIA), and cerebral infarction without residual deficits: Secondary | ICD-10-CM | POA: Insufficient documentation

## 2015-11-14 DIAGNOSIS — Z79899 Other long term (current) drug therapy: Secondary | ICD-10-CM | POA: Diagnosis not present

## 2015-11-14 DIAGNOSIS — Z950 Presence of cardiac pacemaker: Secondary | ICD-10-CM | POA: Diagnosis not present

## 2015-11-14 DIAGNOSIS — I10 Essential (primary) hypertension: Secondary | ICD-10-CM | POA: Diagnosis not present

## 2015-11-14 DIAGNOSIS — R0789 Other chest pain: Secondary | ICD-10-CM | POA: Insufficient documentation

## 2015-11-14 DIAGNOSIS — Z881 Allergy status to other antibiotic agents status: Secondary | ICD-10-CM | POA: Diagnosis not present

## 2015-11-14 DIAGNOSIS — J449 Chronic obstructive pulmonary disease, unspecified: Secondary | ICD-10-CM | POA: Diagnosis not present

## 2015-11-14 DIAGNOSIS — R0602 Shortness of breath: Secondary | ICD-10-CM | POA: Diagnosis present

## 2015-11-14 DIAGNOSIS — E119 Type 2 diabetes mellitus without complications: Secondary | ICD-10-CM | POA: Diagnosis not present

## 2015-11-14 DIAGNOSIS — J45909 Unspecified asthma, uncomplicated: Secondary | ICD-10-CM | POA: Insufficient documentation

## 2015-11-14 DIAGNOSIS — Z794 Long term (current) use of insulin: Secondary | ICD-10-CM | POA: Insufficient documentation

## 2015-11-14 DIAGNOSIS — F17210 Nicotine dependence, cigarettes, uncomplicated: Secondary | ICD-10-CM | POA: Diagnosis not present

## 2015-11-14 NOTE — ED Provider Notes (Signed)
By signing my name below, I, Arlan Organ, attest that this documentation has been prepared under the direction and in the presence of Misheel Gowans N Ell Tiso, DO.  Electronically Signed: Arlan Organ, ED Scribe. 11/15/2015. 12:23 AM.   TIME SEEN: 12:04 AM   CHIEF COMPLAINT:  Chief Complaint  Patient presents with  . Shortness of Breath     HPI:  HPI Comments: [name] is a 50 y.o. female with a PMHx of DM, HTN, , sinus node dysfunction status post pacemaker, prior stroke who presents to the Emergency Department complaining of constant, ongoing shortness of breath x 1 day. Pt also reports cough, diarrhea, chest discomfort with coughing, and fatigue. Has had subjective fevers and chills. No nausea, vomiting or diarrhea. No aggravating or alleviating factors at this time. No OTC medications or home remedies attempted prior to arrival.  Pt states multiple people in her home have been sick with the Flu. She received both PNA and Flu vaccination this season.   PCP: Elige Radon Dettinger, MD    ROS: See HPI Constitutional: Subjective fever. Positive fatigue Eyes: no drainage  ENT: no runny nose   Cardiovascular:  Positive chest pain only with coughing Resp: Positive cough and  SOB  GI: no vomiting. Positive diarrhea GU: no dysuria Integumentary: no rash  Allergy: no hives  Musculoskeletal: no leg swelling  Neurological: no slurred speech ROS otherwise negative  PAST MEDICAL HISTORY/PAST SURGICAL HISTORY:  Past Medical History  Diagnosis Date  . Type 2 diabetes mellitus (HCC)   . Essential hypertension   . History of stroke     Jun 2011 -- right hand weakness  . Inappropriate sinus tachycardia (HCC)     Sinus node modification 02-25-2003 by Dr. Lewayne Bunting  . History of kidney stones   . GERD (gastroesophageal reflux disease)   . History of hiatal hernia   . History of syncope   . Sinus node dysfunction (HCC)     Symptomatic bradycardia  . Symptomatic sinus bradycardia   .  Cardiac pacemaker in situ 2009    DDD [employer] -- ALTRUNA 60  . Crohn's disease (HCC)     Large intestine  . Gastroparesis   . Pelvic pain in female   . Arthritis   . Wears glasses   . OSA (obstructive sleep apnea)     Study done 2005 -- pt refused CPAP/previously was using nocturnal oxygen until one year ago pt states PCP is monitoring pt without  . COPD with asthma (HCC)     GOLD 2-3 --  pulmologist (last visit 2011) Dr. Marchelle Gearing  . Depression 2016    PTSD  . LLQ abdominal tenderness 10/20/2015    MEDICATIONS:  Prior to Admission medications   Medication Sig Start Date End Date Taking? Authorizing Provider  albuterol (PROVENTIL) (2.5 MG/3ML) 0.083% nebulizer solution Take 3 mLs (2.5 mg total) by nebulization every 6 (six) hours as needed for wheezing or shortness of breath. 11/09/15   Elige Radon Dettinger, MD  aspirin EC 81 MG tablet Take 81 mg by mouth daily.    Historical Provider, MD  BD PEN NEEDLE NANO U/F 32G X 4 MM MISC 1 each by Does not apply route 4 (four) times daily. 11/09/15   Elige Radon Dettinger, MD  carvedilol (COREG) 12.5 MG tablet Take 1 tablet (12.5 mg total) by mouth 2 (two) times daily with a meal. 11/09/15   Elige Radon Dettinger, MD  clopidogrel (PLAVIX) 75 MG tablet Take 1 tablet (75 mg total)  by mouth daily. 11/09/15   Elige Radon Dettinger, MD  diclofenac sodium (VOLTAREN) 1 % GEL Apply 4 g topically 4 (four) times daily. 11/09/15   Elige Radon Dettinger, MD  furosemide (LASIX) 40 MG tablet Take 1 tablet (40 mg total) by mouth every morning. 11/09/15   Elige Radon Dettinger, MD  gabapentin (NEURONTIN) 300 MG capsule Take 1 capsule (300 mg total) by mouth 3 (three) times daily. 11/09/15   Elige Radon Dettinger, MD  HYDROcodone-acetaminophen (NORCO) 10-325 MG tablet Take 1 tablet by mouth every 8 (eight) hours as needed. For pain 10/09/15   Historical Provider, MD  ibuprofen (ADVIL,MOTRIN) 800 MG tablet Take 1 tablet (800 mg total) by mouth every 8 (eight) hours as needed. 11/09/15    Elige Radon Dettinger, MD  insulin aspart (NOVOLOG) 100 UNIT/ML injection Inject 15 Units into the skin 3 (three) times daily before meals. 11/09/15   Elige Radon Dettinger, MD  LEVEMIR FLEXTOUCH 100 UNIT/ML Pen Inject 80 Units into the skin daily at 10 pm. 11/09/15   Elige Radon Dettinger, MD  lisinopril-hydrochlorothiazide (PRINZIDE,ZESTORETIC) 10-12.5 MG tablet Take 1 tablet by mouth daily. 08/30/15   Historical Provider, MD  metFORMIN (GLUCOPHAGE) 1000 MG tablet Take 1 tablet (1,000 mg total) by mouth 2 (two) times daily with a meal. 11/09/15   Elige Radon Dettinger, MD  mometasone-formoterol (DULERA) 100-5 MCG/ACT AERO Take 2 puffs first thing in am and then another 2 puffs about 12 hours later. 10/24/15   Nyoka Cowden, MD  NOVOLOG FLEXPEN 100 UNIT/ML FlexPen ADM 15 UNITS [place] TID B MEALS 11/09/15   Historical Provider, MD  OLANZapine (ZYPREXA) 5 MG tablet Take 1 tablet (5 mg total) by mouth at bedtime. 11/09/15   Elige Radon Dettinger, MD  ondansetron (ZOFRAN-ODT) 8 MG disintegrating tablet Take 1 tablet (8 mg total) by mouth every 6 (six) hours as needed for nausea or vomiting. 08/02/15   Elige Radon Dettinger, MD  OXYGEN Inhale 3 L/day into the lungs at bedtime.    Historical Provider, MD  pantoprazole (PROTONIX) 40 MG tablet TAKE 1 TABLET BY MOUTH DAILY FOR STOMACH 11/09/15   Elige Radon Dettinger, MD  potassium chloride SA (K-DUR,KLOR-CON) 20 MEQ tablet Take 1 tablet (20 mEq total) by mouth daily. 11/09/15   Elige Radon Dettinger, MD  sertraline (ZOLOFT) 100 MG tablet TAKE 1 TABLET(100 MG) BY MOUTH DAILY 11/09/15   Elige Radon Dettinger, MD  simvastatin (ZOCOR) 40 MG tablet Take 1 tablet (40 mg total) by mouth at bedtime. 11/09/15   Elige Radon Dettinger, MD  tiZANidine (ZANAFLEX) 4 MG tablet Take 3 tablets (12 mg total) by mouth at bedtime. 11/09/15   Elige Radon Dettinger, MD    ALLERGIES:  Allergies  Allergen Reactions  . Amoxicillin Hives  . Flexeril [Cyclobenzaprine] Hives    SOCIAL HISTORY:  Social History  Substance  Use Topics  . Smoking status: Current Every Day Smoker -- 1.50 packs/day for 42 years    Types: Cigarettes    Start date: 02/02/1973  . Smokeless tobacco: Never Used     Comment: 1 - 2 ppd depends on stress  . Alcohol Use: No    FAMILY HISTORY: Family History  Problem Relation Age of Onset  . Colon cancer Neg Hx   . Diabetes Mother   . Diabetes Brother   . Diabetes Sister   . Heart disease Father     Deceased. MI. Mother, 2 brothers, sister, nephew also have heart disease  . Emphysema Father  Died of it. Was pt of Dr. Sherene Sires   . Heart attack Father   . Asthma Mother   . Cancer Father     ? type    EXAM: BP 170/100 mmHg  Pulse 81  Temp(Src) 99.9 F (37.7 C) (Oral)  Resp 24  Ht  (1.626 m)  Wt 208 lb (94.348 kg)  BMI 35.69 kg/m2  SpO2 99% CONSTITUTIONAL: Alert and oriented and responds appropriately to questions.Chronically ill appearing, appears uncomfortable but non toxic; oral temp 99.9 HEAD: Normocephalic EYES: Conjunctivae clear, PERRL ENT: normal nose; no rhinorrhea; moist mucous membranes; pharynx without lesions noted; no tonsillar hypertrophy or exudate, no uvular deviation, no trismus or drooling, normal phonation, no stridor, no dental caries or abscess noted, no Ludwig's angina, tongue sits flat in the bottom of the mouth NECK: Supple, no meningismus, no LAD; no nuchal rigidity, no JVD CARD: RRR; S1 and S2 appreciated; no murmurs, no clicks, no rubs, no gallops RESP: tachypnic, no hypoxia, no respiratory distress, diffuse expiratory wheezing and diminished at bases bilaterally, no rhonchi or rales ABD/GI: Normal bowel sounds; non-distended; soft, non-tender, no rebound, no guarding, no peritoneal signs BACK:  The back appears normal and is non-tender to palpation, there is no CVA tenderness EXT: Normal ROM in all joints; non-tender to palpation; no edema; normal capillary refill; no cyanosis, no calf tenderness or swelling    SKIN: Normal color for age and  race; warm; no rash NEURO: Moves all extremities equally, sensation to light touch intact diffusely, cranial nerves II through XII intact PSYCH: The patient's mood and manner are appropriate. Grooming and personal hygiene are appropriate.  MEDICAL DECISION MAKING: Patient here with likely viral illness. She does appear very uncomfortable and is moaning in pain mostly because of her chest pain with coughing. Chest x-ray is clear without infiltrate. She is not hypoxic. Her EKG shows no ischemic abnormalities. Will obtain labs and treat symptomatically with fluids, Toradol, guaifenesin with codeine. She does have some mild expiratory wheezing on exam. Will treat with albuterol and steroids.  ED PROGRESS: Patient's labs unremarkable. No leukocytosis. Normal lactate. She looks much more comfortable after above medications and lungs are now clear with good aeration. We'll discharge with albuterol inhaler, prednisone burst. She does not want to wait for her flu swab to come back. She states that multiple family members did test positive for the flu. We'll treat her with Tamiflu. Discussed with her that this can cause nausea and vomiting. We'll discharge her Zofran. We'll also discharge with guaifenesin with codeine for cough and pain control. Discussed return precautions. She has a PCP for follow-up. She verbalizes understanding and is comfortable with this plan.    EKG Interpretation  Date/Time:  Wednesday November 15 2015 00:27:47 EST Ventricular Rate:  87 PR Interval:  155 QRS Duration: 84 QT Interval:  364 QTC Calculation: 438 R Axis:   48 Text Interpretation:  Sinus rhythm Low voltage, extremity and precordial leads Anteroseptal infarct, old No significant change since last tracing Confirmed by Marque Rademaker,  DO, [place] Gudiel ([id number]) on 11/15/2015 1:56:38 AM      I personally performed the services described in this documentation, which was scribed in my presence. The recorded information has been reviewed and  is accurate.   Layla Maw Spenser Cong, DO 11/15/15 [phone]

## 2015-11-14 NOTE — Telephone Encounter (Signed)
humdifier is not indicated for a lung diagnosis - it can be helpful for sinus problems and if that this the case that's fine with me but won't help nearly as much as smoking cessation

## 2015-11-14 NOTE — ED Notes (Signed)
Patient complaining of shortness of breath and cough since yesterday.

## 2015-11-14 NOTE — Telephone Encounter (Signed)
Pt states that she has been having some head congestion with yellow mucus, worsening cough, wheezing, SOB and pressure under eyes. Pt states that she is currently on Keflex for possible sinus infection. Pt states that she was advised to contact Pulmonary for order for a humidifier. Pt has been using her inhalers as directed. Pt uses gas heat so the air is very dry and she states that she is needing something to add moisture back in the air. Pt reports having to use 3L O2 during the day PRN SOB. Please advise Dr Sherene Sires if you are okay with ordering this based on current symptoms. Thanks.

## 2015-11-14 NOTE — Telephone Encounter (Signed)
Per 10/24/15 OV: Patient Instructions       The key is to stop smoking completely before smoking completely stops you!  Stop lisinopril / advair and spiriva dulera 100 Take 2 puffs first thing in am and then another 2 puffs about 12 hours later.  Work on inhaler technique:  relax and gently blow all the way out then take a nice smooth deep breath back in, triggering the inhaler at same time you start breathing in.  Hold for up to 5 seconds if you can. Blow out thru nose. Rinse and gargle with water when done Continue protonix Take 30-60 min before first meal of the day  GERD (REFLUX)  is an extremely common cause of respiratory symptoms just like yours , many times with no obvious heartburn at all.   It can be treated with medication, but also with lifestyle changes including elevation of the head of your bed (ideally with 6 inch  bed blocks),  Smoking cessation, avoidance of late meals, excessive alcohol, and avoid fatty foods, chocolate, peppermint, colas, red wine, and acidic juices such as orange juice.   NO MINT OR MENTHOL PRODUCTS SO NO COUGH DROPS  USE SUGARLESS CANDY INSTEAD (Jolley ranchers or Stover's or Life Savers) or even ice chips will also do - the key is to swallow to prevent all throat clearing. NO OIL BASED VITAMINS - use powdered substitutes. Please remember to go to the  x-ray department downstairs for your tests - we will call you with the results when they are available. Please schedule a follow up office visit in 2 weeks, sooner if needed to see Tammy NP for bp recheck and set up for pfts to see me  ---- Called spoke with pt. She is requesting [place] to send an order to [employer] for a large humidifier for her living room/dining room. Please advise MW thanks

## 2015-11-14 NOTE — Telephone Encounter (Signed)
Yes that's fine but it should have been ordered by the physician who is treating her sinuses as humdifiers are not indicated for lung problems and so now that I know she's under the care of a doctor for sinus problems defer rx to that md   - note she did not return to see Tammy as requested

## 2015-11-14 NOTE — Telephone Encounter (Signed)
Called made pt aware of below. She needed nothing further

## 2015-11-15 LAB — CBC WITH DIFFERENTIAL/PLATELET
BASOS ABS: 0.1 10*3/uL (ref 0.0–0.1)
BASOS PCT: 1 %
EOS ABS: 0.2 10*3/uL (ref 0.0–0.7)
EOS PCT: 3 %
HCT: 39.4 % (ref 36.0–46.0)
HEMOGLOBIN: 13.1 g/dL (ref 12.0–15.0)
Lymphocytes Relative: 18 %
Lymphs Abs: 1.3 10*3/uL (ref 0.7–4.0)
MCH: 31.6 pg (ref 26.0–34.0)
MCHC: 33.2 g/dL (ref 30.0–36.0)
MCV: 95.2 fL (ref 78.0–100.0)
Monocytes Absolute: 0.7 10*3/uL (ref 0.1–1.0)
Monocytes Relative: 10 %
NEUTROS PCT: 68 %
Neutro Abs: 5 10*3/uL (ref 1.7–7.7)
PLATELETS: 141 10*3/uL — AB (ref 150–400)
RBC: 4.14 MIL/uL (ref 3.87–5.11)
RDW: 12.8 % (ref 11.5–15.5)
WBC: 7.3 10*3/uL (ref 4.0–10.5)

## 2015-11-15 LAB — HEPATIC FUNCTION PANEL
ALBUMIN: 4 g/dL (ref 3.5–5.0)
ALT: 25 U/L (ref 14–54)
AST: 23 U/L (ref 15–41)
Alkaline Phosphatase: 108 U/L (ref 38–126)
BILIRUBIN TOTAL: 0.2 mg/dL — AB (ref 0.3–1.2)
Bilirubin, Direct: <0.1 mg/dL — ABNORMAL LOW (ref 0.1–0.5)
Total Protein: 7.1 g/dL (ref 6.5–8.1)

## 2015-11-15 LAB — BASIC METABOLIC PANEL
ANION GAP: 10 (ref 5–15)
BUN: 17 mg/dL (ref 6–20)
CO2: 23 mmol/L (ref 22–32)
Calcium: 8.7 mg/dL — ABNORMAL LOW (ref 8.9–10.3)
Chloride: 110 mmol/L (ref 101–111)
Creatinine, Ser: 0.92 mg/dL (ref 0.44–1.00)
GLUCOSE: 195 mg/dL — AB (ref 65–99)
Potassium: 4.3 mmol/L (ref 3.5–5.1)
SODIUM: 143 mmol/L (ref 135–145)

## 2015-11-15 LAB — INFLUENZA PANEL BY PCR (TYPE A & B)
H1N1FLUPCR: NOT DETECTED
Influenza A By PCR: NEGATIVE
Influenza B By PCR: NEGATIVE

## 2015-11-15 LAB — I-STAT CG4 LACTIC ACID, ED: Lactic Acid, Venous: 1.55 mmol/L (ref 0.5–2.0)

## 2015-11-15 LAB — TROPONIN I: Troponin I: <0.03 ng/mL (ref <0.031)

## 2015-11-15 MED ORDER — PREDNISONE 20 MG PO TABS: 60.0000 mg | ORAL_TABLET | Freq: Every day | ORAL | Status: DC

## 2015-11-15 MED ORDER — ALBUTEROL SULFATE HFA 108 (90 BASE) MCG/ACT IN AERS: 2.0000 | INHALATION_SPRAY | RESPIRATORY_TRACT | Status: DC | PRN

## 2015-11-15 MED ORDER — ALBUTEROL SULFATE (2.5 MG/3ML) 0.083% IN NEBU
5.0000 mg | INHALATION_SOLUTION | Freq: Once | RESPIRATORY_TRACT | Status: AC
  Administered 2015-11-15: 5 mg via RESPIRATORY_TRACT
  Filled 2015-11-15: qty 6

## 2015-11-15 MED ORDER — ONDANSETRON 4 MG PO TBDP: 4.0000 mg | ORAL_TABLET | Freq: Three times a day (TID) | ORAL | Status: DC | PRN

## 2015-11-15 MED ORDER — OSELTAMIVIR PHOSPHATE 75 MG PO CAPS: 75.0000 mg | ORAL_CAPSULE | Freq: Two times a day (BID) | ORAL | Status: DC

## 2015-11-15 MED ORDER — KETOROLAC TROMETHAMINE 30 MG/ML IJ SOLN
30.0000 mg | Freq: Once | INTRAMUSCULAR | Status: AC
  Administered 2015-11-15: 30 mg via INTRAVENOUS
  Filled 2015-11-15: qty 1

## 2015-11-15 MED ORDER — SODIUM CHLORIDE 0.9 % IV BOLUS (SEPSIS)
1000.0000 mL | Freq: Once | INTRAVENOUS | Status: AC
  Administered 2015-11-15: 1000 mL via INTRAVENOUS

## 2015-11-15 MED ORDER — ACETAMINOPHEN 500 MG PO TABS
1000.0000 mg | ORAL_TABLET | Freq: Once | ORAL | Status: AC
  Administered 2015-11-15: 1000 mg via ORAL
  Filled 2015-11-15: qty 2

## 2015-11-15 MED ORDER — METHYLPREDNISOLONE SODIUM SUCC 125 MG IJ SOLR
125.0000 mg | Freq: Once | INTRAMUSCULAR | Status: AC
  Administered 2015-11-15: 125 mg via INTRAVENOUS
  Filled 2015-11-15: qty 2

## 2015-11-15 MED ORDER — GUAIFENESIN-CODEINE 100-10 MG/5ML PO SOLN
10.0000 mL | Freq: Once | ORAL | Status: AC
  Administered 2015-11-15: 10 mL via ORAL
  Filled 2015-11-15: qty 10

## 2015-11-15 MED ORDER — GUAIFENESIN-CODEINE 100-10 MG/5ML PO SOLN: 5.0000 mL | Freq: Four times a day (QID) | ORAL | Status: DC | PRN

## 2015-11-15 NOTE — ED Notes (Signed)
Patient asleep.

## 2015-11-15 NOTE — Discharge Instructions (Signed)
I recommend that you alternate between Tylenol 1000 mg every 6 hours as needed for fever and pain and ibuprofen 800 mg every 8 hours as needed for fever and pain. Please rest and drink plenty of fluids.   Influenza, Adult Influenza ("the flu") is a viral infection of the respiratory tract. It occurs more often in winter months because people spend more time in close contact with one another. Influenza can make you feel very sick. Influenza easily spreads from person to person (contagious). CAUSES  Influenza is caused by a virus that infects the respiratory tract. You can catch the virus by breathing in droplets from an infected person's cough or sneeze. You can also catch the virus by touching something that was recently contaminated with the virus and then touching your mouth, nose, or eyes. RISKS AND COMPLICATIONS You may be at risk for a more severe case of influenza if you smoke cigarettes, have diabetes, have chronic heart disease (such as heart failure) or lung disease (such as asthma), or if you have a weakened immune system. Elderly people and pregnant women are also at risk for more serious infections. The most common problem of influenza is a lung infection (pneumonia). Sometimes, this problem can require emergency medical care and may be life threatening. SIGNS AND SYMPTOMS  Symptoms typically last 4 to 10 days and may include:  Fever.  Chills.  Headache, body aches, and muscle aches.  Sore throat.  Chest discomfort and cough.  Poor appetite.  Weakness or feeling tired.  Dizziness.  Nausea or vomiting. DIAGNOSIS  Diagnosis of influenza is often made based on your history and a physical exam. A nose or throat swab test can be done to confirm the diagnosis. TREATMENT  In mild cases, influenza goes away on its own. Treatment is directed at relieving symptoms. For more severe cases, your health care provider may prescribe antiviral medicines to shorten the sickness. Antibiotic  medicines are not effective because the infection is caused by a virus, not by bacteria. HOME CARE INSTRUCTIONS  Take medicines only as directed by your health care provider.  Use a cool mist humidifier to make breathing easier.  Get plenty of rest until your temperature returns to normal. This usually takes 3 to 4 days.  Drink enough fluid to keep your urine clear or pale yellow.  Cover yourmouth and nosewhen coughing or sneezing,and wash your handswellto prevent thevirusfrom spreading.  Stay homefromwork orschool untilthe fever is gonefor at least 58full day. PREVENTION  An annual influenza vaccination (flu shot) is the best way to avoid getting influenza. An annual flu shot is now routinely recommended for all adults in the [place] SEEK MEDICAL CARE IF:  You experiencechest pain, yourcough worsens,or you producemore mucus.  Youhave nausea,vomiting, ordiarrhea.  Your fever returns or gets worse. SEEK IMMEDIATE MEDICAL CARE IF:  You havetrouble breathing, you become short of breath,or your skin ornails becomebluish.  You have severe painor stiffnessin the neck.  You develop a sudden headache, or pain in the face or ear.  You have nausea or vomiting that you cannot control. MAKE SURE YOU:   Understand these instructions.  Will watch your condition.  Will get help right away if you are not doing well or get worse.   This information is not intended to replace advice given to you by your health care provider. Make sure you discuss any questions you have with your health care provider.   Document Released: 09/06/2000 Document Revised: 09/30/2014 Document Reviewed: 12/09/2011 Elsevier Interactive  Patient Education 2016 [employer].

## 2015-11-20 ENCOUNTER — Telehealth: Payer: Self-pay | Admitting: Family Medicine

## 2015-11-20 NOTE — Telephone Encounter (Signed)
Faxed, pt aware

## 2015-11-20 NOTE — Telephone Encounter (Signed)
Disregard, insurance doesn't pay for humidifier

## 2015-11-21 ENCOUNTER — Telehealth: Payer: Self-pay | Admitting: Family Medicine

## 2015-11-22 NOTE — Telephone Encounter (Signed)
refaxed 11/22/15

## 2015-11-30 DIAGNOSIS — R05 Cough: Secondary | ICD-10-CM | POA: Diagnosis present

## 2015-11-30 DIAGNOSIS — J370 Chronic laryngitis: Secondary | ICD-10-CM | POA: Insufficient documentation

## 2015-11-30 DIAGNOSIS — F329 Major depressive disorder, single episode, unspecified: Secondary | ICD-10-CM | POA: Insufficient documentation

## 2015-11-30 DIAGNOSIS — J449 Chronic obstructive pulmonary disease, unspecified: Secondary | ICD-10-CM | POA: Insufficient documentation

## 2015-11-30 DIAGNOSIS — J45909 Unspecified asthma, uncomplicated: Secondary | ICD-10-CM | POA: Insufficient documentation

## 2015-11-30 DIAGNOSIS — Z9189 Other specified personal risk factors, not elsewhere classified: Secondary | ICD-10-CM | POA: Insufficient documentation

## 2015-11-30 DIAGNOSIS — E119 Type 2 diabetes mellitus without complications: Secondary | ICD-10-CM | POA: Insufficient documentation

## 2015-11-30 DIAGNOSIS — Z87442 Personal history of urinary calculi: Secondary | ICD-10-CM | POA: Insufficient documentation

## 2015-11-30 DIAGNOSIS — I10 Essential (primary) hypertension: Secondary | ICD-10-CM | POA: Insufficient documentation

## 2015-11-30 DIAGNOSIS — Z8673 Personal history of transient ischemic attack (TIA), and cerebral infarction without residual deficits: Secondary | ICD-10-CM | POA: Insufficient documentation

## 2015-11-30 DIAGNOSIS — F17210 Nicotine dependence, cigarettes, uncomplicated: Secondary | ICD-10-CM | POA: Diagnosis not present

## 2015-12-01 ENCOUNTER — Encounter (HOSPITAL_COMMUNITY): Payer: Self-pay

## 2015-12-01 ENCOUNTER — Emergency Department (HOSPITAL_COMMUNITY)
Admission: EM | Admit: 2015-12-01 | Discharge: 2015-12-01 | Disposition: A | Discharge status: Home / Self Care | Payer: Medicaid Other | Attending: Dermatology | Admitting: Dermatology

## 2015-12-01 ENCOUNTER — Ambulatory Visit: Payer: Medicaid Other | Admitting: Family Medicine

## 2015-12-01 NOTE — ED Notes (Signed)
Coughing with every breath, congestion, hard to talk per pt. Feels like I am loosing my voice per pt. My tongue feels sore.

## 2015-12-02 ENCOUNTER — Encounter: Payer: Medicaid Other | Admitting: Family Medicine

## 2015-12-05 ENCOUNTER — Emergency Department (HOSPITAL_COMMUNITY): Payer: Medicaid Other

## 2015-12-05 ENCOUNTER — Encounter (HOSPITAL_COMMUNITY): Payer: Self-pay | Admitting: Emergency Medicine

## 2015-12-05 ENCOUNTER — Emergency Department (HOSPITAL_COMMUNITY)
Admission: EM | Admit: 2015-12-05 | Discharge: 2015-12-05 | Disposition: A | Discharge status: Home / Self Care | Payer: Medicaid Other | Attending: Emergency Medicine | Admitting: Emergency Medicine

## 2015-12-05 DIAGNOSIS — Z8673 Personal history of transient ischemic attack (TIA), and cerebral infarction without residual deficits: Secondary | ICD-10-CM | POA: Diagnosis not present

## 2015-12-05 DIAGNOSIS — Z7984 Long term (current) use of oral hypoglycemic drugs: Secondary | ICD-10-CM | POA: Insufficient documentation

## 2015-12-05 DIAGNOSIS — I10 Essential (primary) hypertension: Secondary | ICD-10-CM | POA: Insufficient documentation

## 2015-12-05 DIAGNOSIS — E119 Type 2 diabetes mellitus without complications: Secondary | ICD-10-CM | POA: Insufficient documentation

## 2015-12-05 DIAGNOSIS — Y929 Unspecified place or not applicable: Secondary | ICD-10-CM | POA: Insufficient documentation

## 2015-12-05 DIAGNOSIS — S20212A Contusion of left front wall of thorax, initial encounter: Secondary | ICD-10-CM | POA: Insufficient documentation

## 2015-12-05 DIAGNOSIS — Z79899 Other long term (current) drug therapy: Secondary | ICD-10-CM | POA: Insufficient documentation

## 2015-12-05 DIAGNOSIS — Y939 Activity, unspecified: Secondary | ICD-10-CM | POA: Insufficient documentation

## 2015-12-05 DIAGNOSIS — Z794 Long term (current) use of insulin: Secondary | ICD-10-CM | POA: Insufficient documentation

## 2015-12-05 DIAGNOSIS — F329 Major depressive disorder, single episode, unspecified: Secondary | ICD-10-CM | POA: Diagnosis not present

## 2015-12-05 DIAGNOSIS — Y999 Unspecified external cause status: Secondary | ICD-10-CM | POA: Insufficient documentation

## 2015-12-05 DIAGNOSIS — F17210 Nicotine dependence, cigarettes, uncomplicated: Secondary | ICD-10-CM | POA: Diagnosis not present

## 2015-12-05 DIAGNOSIS — J449 Chronic obstructive pulmonary disease, unspecified: Secondary | ICD-10-CM | POA: Insufficient documentation

## 2015-12-05 DIAGNOSIS — Z950 Presence of cardiac pacemaker: Secondary | ICD-10-CM | POA: Insufficient documentation

## 2015-12-05 DIAGNOSIS — F418 Other specified anxiety disorders: Secondary | ICD-10-CM | POA: Diagnosis not present

## 2015-12-05 DIAGNOSIS — J45909 Unspecified asthma, uncomplicated: Secondary | ICD-10-CM | POA: Insufficient documentation

## 2015-12-05 DIAGNOSIS — J441 Chronic obstructive pulmonary disease with (acute) exacerbation: Secondary | ICD-10-CM | POA: Insufficient documentation

## 2015-12-05 DIAGNOSIS — Z791 Long term (current) use of non-steroidal anti-inflammatories (NSAID): Secondary | ICD-10-CM | POA: Diagnosis not present

## 2015-12-05 DIAGNOSIS — S299XXA Unspecified injury of thorax, initial encounter: Secondary | ICD-10-CM | POA: Diagnosis present

## 2015-12-05 DIAGNOSIS — Z7982 Long term (current) use of aspirin: Secondary | ICD-10-CM | POA: Diagnosis not present

## 2015-12-05 DIAGNOSIS — F419 Anxiety disorder, unspecified: Secondary | ICD-10-CM

## 2015-12-05 MED ORDER — HYDROXYZINE HCL 25 MG PO TABS: 25.0000 mg | ORAL_TABLET | Freq: Four times a day (QID) | ORAL | Status: DC | PRN

## 2015-12-05 MED ORDER — PREDNISONE 20 MG PO TABS: 60.0000 mg | ORAL_TABLET | Freq: Every day | ORAL | Status: DC

## 2015-12-05 MED ORDER — LORAZEPAM 1 MG PO TABS
1.0000 mg | ORAL_TABLET | Freq: Once | ORAL | Status: AC
  Administered 2015-12-05: 1 mg via ORAL
  Filled 2015-12-05: qty 1

## 2015-12-05 MED ORDER — PREDNISONE 50 MG PO TABS
60.0000 mg | ORAL_TABLET | Freq: Once | ORAL | Status: AC
  Administered 2015-12-05: 60 mg via ORAL
  Filled 2015-12-05: qty 1

## 2015-12-05 MED ORDER — IPRATROPIUM-ALBUTEROL 0.5-2.5 (3) MG/3ML IN SOLN
3.0000 mL | RESPIRATORY_TRACT | Status: DC | PRN
  Administered 2015-12-05: 3 mL via RESPIRATORY_TRACT
  Filled 2015-12-05: qty 3

## 2015-12-05 NOTE — Discharge Instructions (Signed)
Chest Contusion A chest contusion is a deep bruise on your chest area. Contusions are the result of an injury that caused bleeding under the skin. A chest contusion may involve bruising of the skin, muscles, or ribs. The contusion may turn blue, purple, or yellow. Minor injuries will give you a painless contusion, but more severe contusions may stay painful and swollen for a few weeks. CAUSES  A contusion is usually caused by a blow, trauma, or direct force to an area of the body. SYMPTOMS   Swelling and redness of the injured area.  Discoloration of the injured area.  Tenderness and soreness of the injured area.  Pain. DIAGNOSIS  The diagnosis can be made by taking a history and performing a physical exam. An X-ray, CT scan, or MRI may be needed to determine if there were any associated injuries, such as broken bones (fractures) or internal injuries. TREATMENT  Often, the best treatment for a chest contusion is resting, icing, and applying cold compresses to the injured area. Deep breathing exercises may be recommended to reduce the risk of pneumonia. Over-the-counter medicines may also be recommended for pain control. HOME CARE INSTRUCTIONS   Put ice on the injured area.  Put ice in a plastic bag.  Place a towel between your skin and the bag.  Leave the ice on for 15-20 minutes, 03-04 times a day.  Only take over-the-counter or prescription medicines as directed by your caregiver. Your caregiver may recommend avoiding anti-inflammatory medicines (aspirin, ibuprofen, and naproxen) for 48 hours because these medicines may increase bruising.  Rest the injured area.  Perform deep-breathing exercises as directed by your caregiver.  Stop smoking if you smoke.  Do not lift objects over 5 pounds (2.3 kg) for 3 days or longer if recommended by your caregiver. SEEK IMMEDIATE MEDICAL CARE IF:   You have increased bruising or swelling.  You have pain that is getting worse.  You have  difficulty breathing.  You have dizziness, weakness, or fainting.  You have blood in your urine or stool.  You cough up or vomit blood.  Your swelling or pain is not relieved with medicines. MAKE SURE YOU:   Understand these instructions.  Will watch your condition.  Will get help right away if you are not doing well or get worse.   This information is not intended to replace advice given to you by your health care provider. Make sure you discuss any questions you have with your health care provider.   Document Released: 06/04/2001 Document Revised: 06/03/2012 Document Reviewed: 03/02/2012 Elsevier Interactive Patient Education 2016 Elsevier Inc.  Chronic Obstructive Pulmonary Disease Exacerbation Chronic obstructive pulmonary disease (COPD) is a common lung condition in which airflow from the lungs is limited. COPD is a general term that can be used to describe many different lung problems that limit airflow, including chronic bronchitis and emphysema. COPD exacerbations are episodes when breathing symptoms become much worse and require extra treatment. Without treatment, COPD exacerbations can be life threatening, and frequent COPD exacerbations can cause further damage to your lungs. CAUSES  Respiratory infections.  Exposure to smoke.  Exposure to air pollution, chemical fumes, or dust. Sometimes there is no apparent cause or trigger. RISK FACTORS  Smoking cigarettes.  Older age.  Frequent prior COPD exacerbations. SIGNS AND SYMPTOMS  Increased coughing.  Increased thick spit (sputum) production.  Increased wheezing.  Increased shortness of breath.  Rapid breathing.  Chest tightness. DIAGNOSIS Your medical history, a physical exam, and tests will help  your health care provider make a diagnosis. Tests may include:  A chest X-ray.  Basic lab tests.  Sputum testing.  An arterial blood gas test. TREATMENT Depending on the severity of your COPD  exacerbation, you may need to be admitted to a hospital for treatment. Some of the treatments commonly used to treat COPD exacerbations are:   Antibiotic medicines.  Bronchodilators. These are drugs that expand the air passages. They may be given with an inhaler or nebulizer. Spacer devices may be needed to help improve drug delivery.  Corticosteroid medicines.  Supplemental oxygen therapy.  Airway clearing techniques, such as noninvasive ventilation (NIV) and positive expiratory pressure (PEP). These provide respiratory support through a mask or other noninvasive device. HOME CARE INSTRUCTIONS  Do not smoke. Quitting smoking is very important to prevent COPD from getting worse and exacerbations from happening as often.  Avoid exposure to all substances that irritate the airway, especially to tobacco smoke.  If you were prescribed an antibiotic medicine, finish it all even if you start to feel better.  Take all medicines as directed by your health care provider.It is important to use correct technique with inhaled medicines.  Drink enough fluids to keep your urine clear or pale yellow (unless you have a medical condition that requires fluid restriction).  Use a cool mist vaporizer. This makes it easier to clear your chest when you cough.  If you have a home nebulizer and oxygen, continue to use them as directed.  Maintain all necessary vaccinations to prevent infections.  Exercise regularly.  Eat a healthy diet.  Keep all follow-up appointments as directed by your health care provider. SEEK IMMEDIATE MEDICAL CARE IF:  You have worsening shortness of breath.  You have trouble talking.  You have severe chest pain.  You have blood in your sputum.  You have a fever.  You have weakness, vomit repeatedly, or faint.  You feel confused.  You continue to get worse. MAKE SURE YOU:  Understand these instructions.  Will watch your condition.  Will get help right away if  you are not doing well or get worse.   This information is not intended to replace advice given to you by your health care provider. Make sure you discuss any questions you have with your health care provider.   Document Released: 07/07/2007 Document Revised: 09/30/2014 Document Reviewed: 05/14/2013 Elsevier Interactive Patient Education 2016 Elsevier Inc.  Generalized Anxiety Disorder Generalized anxiety disorder (GAD) is a mental disorder. It interferes with life functions, including relationships, work, and school. GAD is different from normal anxiety, which everyone experiences at some point in their lives in response to specific life events and activities. Normal anxiety actually helps [place] prepare for and get through these life events and activities. Normal anxiety goes away after the event or activity is over.  GAD causes anxiety that is not necessarily related to specific events or activities. It also causes excess anxiety in proportion to specific events or activities. The anxiety associated with GAD is also difficult to control. GAD can vary from mild to severe. People with severe GAD can have intense waves of anxiety with physical symptoms (panic attacks).  SYMPTOMS The anxiety and worry associated with GAD are difficult to control. This anxiety and worry are related to many life events and activities and also occur more days than not for 6 months or longer. People with GAD also have three or more of the following symptoms (one or more in children):  Restlessness.   Fatigue.  Difficulty concentrating.   Irritability.  Muscle tension.  Difficulty sleeping or unsatisfying sleep. DIAGNOSIS GAD is diagnosed through an assessment by your health care provider. Your health care provider will ask you questions aboutyour mood,physical symptoms, and events in your life. Your health care provider may ask you about your medical history and use of alcohol or drugs, including prescription  medicines. Your health care provider may also do a physical exam and blood tests. Certain medical conditions and the use of certain substances can cause symptoms similar to those associated with GAD. Your health care provider may refer you to a mental health specialist for further evaluation. TREATMENT The following therapies are usually used to treat GAD:   Medication. Antidepressant medication usually is prescribed for long-term daily control. Antianxiety medicines may be added in severe cases, especially when panic attacks occur.   Talk therapy (psychotherapy). Certain types of talk therapy can be helpful in treating GAD by providing support, education, and guidance. A form of talk therapy called cognitive behavioral therapy can teach you healthy ways to think about and react to daily life events and activities.  Stress managementtechniques. These include yoga, meditation, and exercise and can be very helpful when they are practiced regularly. A mental health specialist can help determine which treatment is best for you. Some people see improvement with one therapy. However, other people require a combination of therapies.   This information is not intended to replace advice given to you by your health care provider. Make sure you discuss any questions you have with your health care provider.   Document Released: 01/04/2013 Document Revised: 09/30/2014 Document Reviewed: 01/04/2013 Elsevier Interactive Patient Education 2016 [employer].    [employer] Guide Outpatient Counseling/Substance Abuse Adult The [employer] 211 is a great source of information about community services available.  Access by dialing 2-1-1 from anywhere in [place], or by website -  PooledIncome.pl.   Other Local Resources (Updated 09/2015)  Crisis Hotlines   Services     Area Served  [employer]  Crisis Hotline, available 24 hours a day, 7 days a week:  3346039910 [facility], [place]   [facility]  Crisis Hotline, available 24 hours a day, 7 days a week: 201-094-0297 [facility], [place]  [facility]  Suicide Prevention Hotline, available 24 hours a day, 7 days a week: 416-115-8483 [facility], [place]  [employer], available 24 hours a day, 7 days a week: [phone] [facility], [place]   [facility] Access to [employer], available 24 hours a day, 7 days a week: 917-887-6427 All   [facility]  Crisis Hotline, available 24 hours a day, 7 days a week: 520-196-2970 All   Other Local Resources (Updated 09/2015)  Outpatient Counseling/ Substance Abuse Programs  Services     Address and Phone Number  ADS ([facility] Drug Services)   Options include Individual counseling, group counseling, intensive outpatient program (several hours a day, several days a week)  Offers depression assessments  Provides methadone maintenance program [phone] [address] [place], [place] [address]   [facility]   Offers partial hospitalization/day treatment and DUI/DWI programs  [employer], private insurance [phone] [address] [place], [place] [address]  [facility]    Services include intensive outpatient program (several hours a day, several days a week), outpatient treatment, DUI/DWI services, family education  Also has some services specifically for [employer] transitional housing  [phone]  [address] [place], [place] [address]     [facility]  [employer], private pay, and private insurance [phone] [address] [place], [address] [place], [place] [address]  [employer] [facility]  Services include individual counseling, substance abuse intensive outpatient program (several hours a day, several days a week), day treatment  Delene Loll, Medicaid, private insurance  [phone] [address] [place], [place] [address]  [facility]   Offers substance abuse intensive outpatient program (several hours a day, several days a week), partial hospitalization program [phone] [address] [place], [place] [address]  [phone] [address] [place], [place] [address]  [phone] [address] [place], [place] [address]  [phone], [address] [place], [place] [address]  [facility]  Individual counseling only  Accepts private insurance only [phone] [address] [place], [place] [address]  [facility]  Methadone maintenance program [phone] [address] [place], [place] [address]  [facility]  Walk-In Clinic providing substance abuse and mental health counseling  Accepts Medicaid, Medicare, private insurance  Offers sliding scale for uninsured [phone] [address] [place], [place]   [facility] [place], [employer].  Offers individual counseling, and intensive in-home services [phone] [address] [place], [place] [address]  [facility] individual counseling, family counseling, group therapy, domestic violence counseling, consumer credit counseling  Accepts Medicare, Medicaid, private insurance  Offers sliding scale for uninsured [phone] [address] [place], [place] [address]  [phone] [facility], [address] [place], [place] [address]  [facility]  Offers individual, family and group counseling  3 locations - [place], [place], and [place]  [phone]  [address] [place], [place] [address]  [address] [place], [place] [address]  [address] [place], [place] [address]  [facility]    Offers psychiatric assessment, 8-week Intensive Outpatient Program (several hours a day, several times a week, daytime or  evenings), early recovery group, family Program, medication management  Private pay or private insurance only [phone], or  [phone] [address] [place], [place] [address]  [place] [employer] individual, couples and family counseling  Accepts Medicaid, private insurance, and sliding scale for uninsured [phone] [address] [place], [place] [address]  Len Blalock, MD  Individual counseling  Private insurance [phone] [address] [place], [place] [address]  [facility]   Offers assessment, substance abuse treatment, and behavioral health treatment [phone] [place], [place] [address]  [facility]  Individual counseling  Accepts private insurance [phone] [address] [place], [place] [address]  [facility]  Individual counseling  Delene Loll, private insurance [phone] [address] [place], [place] [address]  [facility]    Offers intensive outpatient program (several hours a day, several times a week)  Private pay, private insurance [phone] [facility] [place], [place]  [facility]  Individual counseling  Medicare, private insurance [phone] [address] [place], [place] [address]  [facility]    Offers intensive outpatient program (several hours a day, several times a week) and partial hospitalization program [phone] [address] [place], [place] [address]  Emerson Monte, MD  Individual counseling [phone] [address] [place], [place] [address]  [facility] counseling to individuals, couples,  and families  Accepts Medicare and private insurance; offers sliding scale for uninsured [phone] [address] [place], [place] [address]  [facility]   [place] counseling [phone] [address] [place], [place] [address]  [facility] crisis counseling, individual counseling, group therapy, in-home therapy, domestic violence services, day treatment, DWI services, Administrator, arts (CST), Doctor, hospital (ACTT), substance abuse Intensive Outpatient Program (several hours a day, several times a week)  2 locations - [place] and [place] [phone] [address] [place], [place] [address]  [phone] [address] [place], [place] [address]  [facility]     Individual counseling and group therapy  [employer], [place], [place] [phone] [address] [place], [place]  [facility]  Offers individual and family counseling  Offers LGBTQ services  Accepts private insurance and private pay [phone] [address] [place], [place] [address]  [facility]    Offers individual counseling, group therapy, and outpatient detox  Accepts private insurance [phone] [address] [place], [place]  [facility]  Individual counseling  Accepts Medicare, private insurance [phone] [address] [place], [place] [address]  [employer]  Individual counseling  Accepts Medicare, private insurance [phone] [address] [place], [place] [address]  [facility]   Offers substance abuse Intensive Outpatient Program (several hours a day, several times a week) [phone], or [phone] [place], [place]
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
## 2015-12-05 NOTE — ED Notes (Signed)
Pace maker reading at this time and sent to [employer]

## 2015-12-05 NOTE — ED Notes (Signed)
Patient verbalizes understanding of discharge instructions, prescription medications, home care and follow up care. Patient out of department at this time. 
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
## 2015-12-05 NOTE — ED Notes (Signed)
Patient ambulatory to restroom  ?

## 2015-12-05 NOTE — ED Notes (Signed)
Patients fiance at bedside, at patients request

## 2015-12-07 ENCOUNTER — Encounter: Payer: Self-pay | Admitting: Family Medicine

## 2015-12-07 ENCOUNTER — Ambulatory Visit (INDEPENDENT_AMBULATORY_CARE_PROVIDER_SITE_OTHER): Payer: Medicaid Other | Admitting: Family Medicine

## 2015-12-07 VITALS — BP 106/75 | HR 61 | Temp 97.7°F | Ht 64.0 in | Wt 208.6 lb

## 2015-12-07 DIAGNOSIS — J439 Emphysema, unspecified: Secondary | ICD-10-CM | POA: Diagnosis not present

## 2015-12-07 NOTE — Progress Notes (Signed)
BP 106/75 mmHg  Pulse 61  Temp(Src) 97.7 F (36.5 C) (Oral)  Ht 5\' 4"  (1.626 m)  Wt 208 lb 9.6 oz (94.62 kg)  BMI 35.79 kg/m2   Subjective:    Patient ID: [name], female    DOB: 1966-05-31, 50 y.o.   MRN: [id number]  HPI: [name] is a 50 y.o. female presenting on 12/07/2015 for Depression; Polydipsia; and Cough   HPI COPD and breathing difficulties Patient is coming in today because of COPD and breathing issues and depression. I discussed with her breathing been as bad as that is why we are having trouble controlling her anxiety and her depression. She denies any suicidal ideations or thoughts of hurting herself. She is continuously having wheezing and shortness of breath and having to use her albuterol inhaler 3-5 times daily if not more and has seen pulmonology and is on Dulera twice a day. She also has oxygen prescribed for at nighttime but she started using it intermittently during the day. She denies any fevers or chills. She feels like her breathing is worsening. She has a repeat appointment with pulmonology on April 4. We discussed this being probably end-stage COPD or stage IV COPD and how important it is for her to stop smoking. Currently she is still smoking a pack and a half per day which is down from 2 packs per day.  Relevant past medical, surgical, family and social history reviewed and updated as indicated. Interim medical history since our last visit reviewed. Allergies and medications reviewed and updated.  Review of Systems  Constitutional: Negative for fever and chills.  HENT: Positive for congestion, postnasal drip, rhinorrhea, sinus pressure, sneezing and sore throat. Negative for ear discharge and ear pain.   Eyes: Negative for pain, redness and visual disturbance.  Respiratory: Positive for cough, chest tightness, shortness of breath and wheezing.   Cardiovascular: Negative for chest pain and leg swelling.  Genitourinary: Negative for dysuria and  difficulty urinating.  Musculoskeletal: Negative for back pain and gait problem.  Skin: Negative for rash.  Neurological: Negative for light-headedness and headaches.  Psychiatric/Behavioral: Negative for behavioral problems and agitation.  All other systems reviewed and are negative.   Per HPI unless specifically indicated above     Medication List       This list is accurate as of: 12/07/15  2:40 PM.  Always use your most recent med list.               albuterol (2.5 MG/3ML) 0.083% nebulizer solution  Commonly known as:  PROVENTIL  Take 3 mLs (2.5 mg total) by nebulization every 6 (six) hours as needed for wheezing or shortness of breath.     albuterol 108 (90 Base) MCG/ACT inhaler  Commonly known as:  PROVENTIL HFA;VENTOLIN HFA  Inhale 2 puffs into the lungs every 4 (four) hours as needed for wheezing or shortness of breath.     aspirin EC 81 MG tablet  Take 81 mg by mouth daily.     BD PEN NEEDLE NANO U/F 32G X 4 MM Misc  Generic drug:  Insulin Pen Needle  1 each by Does not apply route 4 (four) times daily.     carvedilol 12.5 MG tablet  Commonly known as:  COREG  Take 1 tablet (12.5 mg total) by mouth 2 (two) times daily with a meal.     clopidogrel 75 MG tablet  Commonly known as:  PLAVIX  Take 1 tablet (75 mg total) by  mouth daily.     diclofenac sodium 1 % Gel  Commonly known as:  VOLTAREN  Apply 4 g topically 4 (four) times daily.     furosemide 40 MG tablet  Commonly known as:  LASIX  Take 1 tablet (40 mg total) by mouth every morning.     gabapentin 300 MG capsule  Commonly known as:  NEURONTIN  Take 1 capsule (300 mg total) by mouth 3 (three) times daily.     guaiFENesin-codeine 100-10 MG/5ML syrup  Take 5-10 mLs by mouth every 6 (six) hours as needed for cough.     HYDROcodone-acetaminophen 10-325 MG tablet  Commonly known as:  NORCO  Take 1 tablet by mouth every 8 (eight) hours as needed. For pain     hydrOXYzine 25 MG tablet  Commonly  known as:  ATARAX/VISTARIL  Take 1-2 tablets (25-50 mg total) by mouth every 6 (six) hours as needed for anxiety.     ibuprofen 800 MG tablet  Commonly known as:  ADVIL,MOTRIN  Take 1 tablet (800 mg total) by mouth every 8 (eight) hours as needed.     insulin aspart 100 UNIT/ML injection  Commonly known as:  NOVOLOG  Inject 15 Units into the skin 3 (three) times daily before meals.     NOVOLOG FLEXPEN 100 UNIT/ML FlexPen  Generic drug:  insulin aspart  ADM 15 UNITS [place] TID B MEALS     LEVEMIR FLEXTOUCH 100 UNIT/ML Pen  Generic drug:  Insulin Detemir  Inject 80 Units into the skin daily at 10 pm.     lisinopril-hydrochlorothiazide 10-12.5 MG tablet  Commonly known as:  PRINZIDE,ZESTORETIC  Take 1 tablet by mouth daily.     metFORMIN 1000 MG tablet  Commonly known as:  GLUCOPHAGE  Take 1 tablet (1,000 mg total) by mouth 2 (two) times daily with a meal.     mometasone-formoterol 100-5 MCG/ACT Aero  Commonly known as:  DULERA  Take 2 puffs first thing in am and then another 2 puffs about 12 hours later.     OLANZapine 5 MG tablet  Commonly known as:  ZYPREXA  Take 1 tablet (5 mg total) by mouth at bedtime.     ondansetron 4 MG disintegrating tablet  Commonly known as:  ZOFRAN ODT  Take 1 tablet (4 mg total) by mouth every 8 (eight) hours as needed for nausea or vomiting.     OXYGEN  Inhale 3 L/day into the lungs at bedtime.     pantoprazole 40 MG tablet  Commonly known as:  PROTONIX  TAKE 1 TABLET BY MOUTH DAILY FOR STOMACH     potassium chloride SA 20 MEQ tablet  Commonly known as:  K-DUR,KLOR-CON  Take 1 tablet (20 mEq total) by mouth daily.     predniSONE 20 MG tablet  Commonly known as:  DELTASONE  Take 3 tablets (60 mg total) by mouth daily.     sertraline 100 MG tablet  Commonly known as:  ZOLOFT  TAKE 1 TABLET(100 MG) BY MOUTH DAILY     simvastatin 40 MG tablet  Commonly known as:  ZOCOR  Take 1 tablet (40 mg total) by mouth at bedtime.     tiZANidine 4  MG tablet  Commonly known as:  ZANAFLEX  Take 3 tablets (12 mg total) by mouth at bedtime.           Objective:    BP 106/75 mmHg  Pulse 61  Temp(Src) 97.7 F (36.5 C) (Oral)  Ht 5\' 4"  (1.626  m)  Wt 208 lb 9.6 oz (94.62 kg)  BMI 35.79 kg/m2  Wt Readings from Last 3 Encounters:  12/07/15 208 lb 9.6 oz (94.62 kg)  12/05/15 200 lb (90.719 kg)  12/01/15 201 lb (91.173 kg)    Physical Exam  Constitutional: She is oriented to person, place, and time. She appears well-developed and well-nourished. No distress.  HENT:  Right Ear: Tympanic membrane, external ear and ear canal normal.  Left Ear: Tympanic membrane, external ear and ear canal normal.  Nose: Mucosal edema and rhinorrhea present. No epistaxis. Right sinus exhibits no maxillary sinus tenderness and no frontal sinus tenderness. Left sinus exhibits no maxillary sinus tenderness and no frontal sinus tenderness.  Mouth/Throat: Uvula is midline and mucous membranes are normal. Posterior oropharyngeal edema and posterior oropharyngeal erythema present. No oropharyngeal exudate or tonsillar abscesses.  Eyes: Conjunctivae and EOM are normal.  Cardiovascular: Normal rate, regular rhythm, normal heart sounds and intact distal pulses.   No murmur heard. Pulmonary/Chest: Effort normal. No accessory muscle usage. No tachypnea. No respiratory distress. She has decreased breath sounds. She has wheezes. She has rhonchi. She has no rales.  Musculoskeletal: Normal range of motion. She exhibits no edema or tenderness.  Neurological: She is alert and oriented to person, place, and time. Coordination normal.  Skin: Skin is warm and dry. No rash noted. She is not diaphoretic.  Psychiatric: She has a normal mood and affect. Her behavior is normal.  Vitals reviewed.     Assessment & Plan:   Problem List Items Addressed This Visit      Respiratory   COPD GOLD 4 - Primary    Lung function FEV1 shows 56% FEV/FEV1 ratio shows 81%. Discussed  possibly needing oxygen all the time and she will go back to pulmonology to discuss this further medication changes. We also discussed the possibility of having towards more palliative care if she continues to smoke in the next few years she could be ending up there.          Follow up plan: Return in about 4 weeks (around 01/04/2016), or if symptoms worsen or fail to improve.  Counseling provided for all of the vaccine components Orders Placed This Encounter  Procedures  . PR BREATHING CAPACITY TEST    Arville Care, MD [facility] 12/07/2015, 2:40 PM

## 2015-12-08 ENCOUNTER — Other Ambulatory Visit: Payer: Self-pay | Admitting: Family Medicine

## 2015-12-08 NOTE — Telephone Encounter (Signed)
Go ahead and write a prescription that they can have for her to be on oxygen at all times.

## 2015-12-09 ENCOUNTER — Other Ambulatory Visit: Payer: Self-pay | Admitting: Family Medicine

## 2015-12-11 NOTE — Telephone Encounter (Signed)
Hospice? If pt is going to go to continous o2 through [place], we need her to come back in and get the 3 sets of o2 sats and place in notes.

## 2015-12-11 NOTE — Telephone Encounter (Signed)
Okay to have her come in through a nurse visit and do the oxygen and see if she needs it all the time. She is also seen a pulmonology so I told her to discuss this with them as well. This is something they can do at their office if she is going there in the next few days as well.

## 2015-12-12 ENCOUNTER — Telehealth: Payer: Self-pay | Admitting: Family Medicine

## 2015-12-12 NOTE — Telephone Encounter (Signed)
Patient aware, appointment made with Dr. Louanne Skye on 12/13/15 at 9:25 am

## 2015-12-12 NOTE — Telephone Encounter (Signed)
Spoke with patient, see other telephone encounter.

## 2015-12-13 ENCOUNTER — Ambulatory Visit (INDEPENDENT_AMBULATORY_CARE_PROVIDER_SITE_OTHER): Payer: Medicaid Other | Admitting: Family Medicine

## 2015-12-13 ENCOUNTER — Encounter: Payer: Self-pay | Admitting: Family Medicine

## 2015-12-13 VITALS — BP 122/74 | HR 82 | Temp 97.3°F | Ht 64.0 in | Wt 213.6 lb

## 2015-12-13 DIAGNOSIS — J441 Chronic obstructive pulmonary disease with (acute) exacerbation: Secondary | ICD-10-CM | POA: Diagnosis not present

## 2015-12-13 DIAGNOSIS — J449 Chronic obstructive pulmonary disease, unspecified: Secondary | ICD-10-CM | POA: Diagnosis not present

## 2015-12-13 DIAGNOSIS — K120 Recurrent oral aphthae: Secondary | ICD-10-CM

## 2015-12-13 NOTE — Assessment & Plan Note (Signed)
Does not qualify for continuous oxygen, will keep oxygen when necessary

## 2015-12-13 NOTE — Progress Notes (Signed)
BP 122/74 mmHg  Pulse 82  Temp(Src) 97.3 F (36.3 C) (Oral)  Ht 5\' 4"  (1.626 m)  Wt 213 lb 9.6 oz (96.888 kg)  BMI 36.65 kg/m2  SpO2 95%   Subjective:    Patient ID: [name], female    DOB: 1966/06/01, 50 y.o.   MRN: [id number]  HPI: [name] is a 50 y.o. female presenting on 12/13/2015 for Burning in mouth and on tongue and Discuss 24 hour oxygen   HPI COPD exacerbation Patient comes in today because she is having a COPD exacerbation. Her breathing has continually been worsening despite being on inhalers and nebulizers. She has been using her nebulizer at least 3-4 times daily. She is also been using her oxygen as needed. Symptomatically she feels very tight and very wheezy. She is not going back to see the pulmonologist until April 4. She has been treated for multiple COPD exacerbations over the past few months outpatient and inpatient. She is feeling very tight this time and would like to go back to the ED for evaluation. She has failed outpatient attempts to control her breathing. She is still smoking but is trying to decrease her smoking.  Mouth sores Patient has developed some sores in the back of her mouth that are burning and make it difficult for her to eat. She is developed a sores over the past week. She says she tries to rinse out every time after she uses her inhalers but thinks it may be due to that.  Relevant past medical, surgical, family and social history reviewed and updated as indicated. Interim medical history since our last visit reviewed. Allergies and medications reviewed and updated.  Review of Systems  Constitutional: Negative for fever and chills.  HENT: Positive for congestion, mouth sores, postnasal drip, rhinorrhea, sinus pressure, sneezing and sore throat. Negative for ear discharge and ear pain.   Eyes: Negative for pain, redness and visual disturbance.  Respiratory: Positive for cough, chest tightness, shortness of breath and wheezing.     Cardiovascular: Negative for chest pain and leg swelling.  Endocrine: Negative for cold intolerance and heat intolerance.  Genitourinary: Negative for dysuria and difficulty urinating.  Musculoskeletal: Negative for back pain, arthralgias and gait problem.  Skin: Negative for rash.  Neurological: Negative for dizziness, light-headedness and headaches.  Psychiatric/Behavioral: Negative for behavioral problems and agitation.  All other systems reviewed and are negative.   Per HPI unless specifically indicated above     Medication List       This list is accurate as of: 12/13/15 10:31 AM.  Always use your most recent med list.               albuterol (2.5 MG/3ML) 0.083% nebulizer solution  Commonly known as:  PROVENTIL  Take 3 mLs (2.5 mg total) by nebulization every 6 (six) hours as needed for wheezing or shortness of breath.     albuterol 108 (90 Base) MCG/ACT inhaler  Commonly known as:  PROVENTIL HFA;VENTOLIN HFA  Inhale 2 puffs into the lungs every 4 (four) hours as needed for wheezing or shortness of breath.     aspirin EC 81 MG tablet  Take 81 mg by mouth daily.     BD PEN NEEDLE NANO U/F 32G X 4 MM Misc  Generic drug:  Insulin Pen Needle  1 each by Does not apply route 4 (four) times daily.     carvedilol 12.5 MG tablet  Commonly known as:  COREG  Take 1  tablet (12.5 mg total) by mouth 2 (two) times daily with a meal.     clopidogrel 75 MG tablet  Commonly known as:  PLAVIX  Take 1 tablet (75 mg total) by mouth daily.     diclofenac sodium 1 % Gel  Commonly known as:  VOLTAREN  Apply 4 g topically 4 (four) times daily.     furosemide 40 MG tablet  Commonly known as:  LASIX  Take 1 tablet (40 mg total) by mouth every morning.     gabapentin 300 MG capsule  Commonly known as:  NEURONTIN  Take 1 capsule (300 mg total) by mouth 3 (three) times daily.     guaiFENesin-codeine 100-10 MG/5ML syrup  Take 5-10 mLs by mouth every 6 (six) hours as needed for  cough.     HYDROcodone-acetaminophen 10-325 MG tablet  Commonly known as:  NORCO  Take 1 tablet by mouth every 8 (eight) hours as needed. For pain     hydrOXYzine 25 MG tablet  Commonly known as:  ATARAX/VISTARIL  Take 1-2 tablets (25-50 mg total) by mouth every 6 (six) hours as needed for anxiety.     ibuprofen 800 MG tablet  Commonly known as:  ADVIL,MOTRIN  Take 1 tablet (800 mg total) by mouth every 8 (eight) hours as needed.     insulin aspart 100 UNIT/ML injection  Commonly known as:  NOVOLOG  Inject 15 Units into the skin 3 (three) times daily before meals.     NOVOLOG FLEXPEN 100 UNIT/ML FlexPen  Generic drug:  insulin aspart  ADM 15 UNITS [place] TID B MEALS     LEVEMIR FLEXTOUCH 100 UNIT/ML Pen  Generic drug:  Insulin Detemir  Inject 80 Units into the skin daily at 10 pm.     lisinopril-hydrochlorothiazide 10-12.5 MG tablet  Commonly known as:  PRINZIDE,ZESTORETIC  Take 1 tablet by mouth daily.     metFORMIN 1000 MG tablet  Commonly known as:  GLUCOPHAGE  Take 1 tablet (1,000 mg total) by mouth 2 (two) times daily with a meal.     mometasone-formoterol 100-5 MCG/ACT Aero  Commonly known as:  DULERA  Take 2 puffs first thing in am and then another 2 puffs about 12 hours later.     OLANZapine 5 MG tablet  Commonly known as:  ZYPREXA  Take 1 tablet (5 mg total) by mouth at bedtime.     ondansetron 4 MG disintegrating tablet  Commonly known as:  ZOFRAN ODT  Take 1 tablet (4 mg total) by mouth every 8 (eight) hours as needed for nausea or vomiting.     OXYGEN  Inhale 3 L/day into the lungs at bedtime.     pantoprazole 40 MG tablet  Commonly known as:  PROTONIX  TAKE 1 TABLET BY MOUTH DAILY FOR STOMACH     potassium chloride SA 20 MEQ tablet  Commonly known as:  K-DUR,KLOR-CON  Take 1 tablet (20 mEq total) by mouth daily.     predniSONE 20 MG tablet  Commonly known as:  DELTASONE  Take 3 tablets (60 mg total) by mouth daily.     sertraline 100 MG tablet   Commonly known as:  ZOLOFT  TAKE 1 TABLET(100 MG) BY MOUTH DAILY     simvastatin 40 MG tablet  Commonly known as:  ZOCOR  Take 1 tablet (40 mg total) by mouth at bedtime.     tiZANidine 4 MG tablet  Commonly known as:  ZANAFLEX  Take 3 tablets (12 mg total) by  mouth at bedtime.           Objective:    BP 122/74 mmHg  Pulse 82  Temp(Src) 97.3 F (36.3 C) (Oral)  Ht  (1.626 m)  Wt 213 lb 9.6 oz (96.888 kg)  BMI 36.65 kg/m2  SpO2 95%  Wt Readings from Last 3 Encounters:  12/13/15 213 lb 9.6 oz (96.888 kg)  12/07/15 208 lb 9.6 oz (94.62 kg)  12/05/15 200 lb (90.719 kg)    Physical Exam  Constitutional: She is oriented to person, place, and time. She appears well-developed and well-nourished. No distress.  HENT:  Right Ear: Tympanic membrane and external ear normal.  Left Ear: Tympanic membrane and external ear normal.  Nose: Mucosal edema and rhinorrhea present. No nose lacerations.  Mouth/Throat: Posterior oropharyngeal edema present. No oropharyngeal exudate or posterior oropharyngeal erythema.  Small raised papules on her posterior tongue with small amount of white crusting that is able to be scraped off, some concern for thrush but also could be other things as well. Treat for thrush if not improved then consider sending the ENT  Eyes: Conjunctivae and EOM are normal. Pupils are equal, round, and reactive to light.  Neck: Neck supple. No thyromegaly present.  Cardiovascular: Normal rate, regular rhythm, normal heart sounds and intact distal pulses.   No murmur heard. Pulmonary/Chest: She is in respiratory distress. She has wheezes. She has rales. She exhibits no tenderness.  Musculoskeletal: Normal range of motion. She exhibits no edema or tenderness.  Lymphadenopathy:    She has no cervical adenopathy.  Neurological: She is alert and oriented to person, place, and time. Coordination normal.  Skin: Skin is warm and dry. No rash noted. She is not diaphoretic.   Psychiatric: She has a normal mood and affect. Her behavior is normal.  Nursing note and vitals reviewed.   Ambulatory pulse ox shows that she gets down to 90-93%. Sitting in room 95%, standing 94%.    Assessment & Plan:   Problem List Items Addressed This Visit      Respiratory   COPD GOLD 4 - Primary    Does not qualify for continuous oxygen, will keep oxygen when necessary       Other Visit Diagnoses    COPD exacerbation (HCC)        Patient's pulse ox is 90-95, but symptomatically she is very tight and wheezy and struggling to breathe. Wants to go to ER and be evaluated    Oral aphthous ulcer            Follow up plan: Return in about 3 days (around 12/16/2015), or if symptoms worsen or fail to improve.  Counseling provided for all of the vaccine components No orders of the defined types were placed in this encounter.    Arville Care, MD [facility] 12/13/2015, 10:31 AM

## 2015-12-13 NOTE — Assessment & Plan Note (Signed)
Lung function FEV1 shows 56% FEV/FEV1 ratio shows 81%. Discussed possibly needing oxygen all the time and she will go back to pulmonology to discuss this further medication changes. We also discussed the possibility of having towards more palliative care if she continues to smoke in the next few years she could be ending up there.

## 2015-12-25 ENCOUNTER — Other Ambulatory Visit: Payer: Self-pay | Admitting: Internal Medicine

## 2015-12-25 DIAGNOSIS — R0600 Dyspnea, unspecified: Secondary | ICD-10-CM

## 2015-12-26 ENCOUNTER — Ambulatory Visit (INDEPENDENT_AMBULATORY_CARE_PROVIDER_SITE_OTHER): Payer: Medicaid Other | Admitting: Internal Medicine

## 2015-12-26 ENCOUNTER — Encounter: Payer: Self-pay | Admitting: Internal Medicine

## 2015-12-26 ENCOUNTER — Encounter: Payer: Self-pay | Admitting: Gastroenterology

## 2015-12-26 VITALS — BP 90/60 | HR 60 | Ht 64.0 in | Wt 218.0 lb

## 2015-12-26 DIAGNOSIS — Z720 Tobacco use: Secondary | ICD-10-CM | POA: Diagnosis not present

## 2015-12-26 DIAGNOSIS — R0600 Dyspnea, unspecified: Secondary | ICD-10-CM | POA: Diagnosis not present

## 2015-12-26 DIAGNOSIS — J449 Chronic obstructive pulmonary disease, unspecified: Secondary | ICD-10-CM

## 2015-12-26 DIAGNOSIS — F17210 Nicotine dependence, cigarettes, uncomplicated: Secondary | ICD-10-CM

## 2015-12-26 DIAGNOSIS — J209 Acute bronchitis, unspecified: Secondary | ICD-10-CM

## 2015-12-26 LAB — PULMONARY FUNCTION TEST
DL/VA % PRED: 105 %
DL/VA: 5.06 ml/min/mmHg/L
DLCO COR % PRED: 80 %
DLCO UNC % PRED: 79 %
DLCO cor: 19.49 ml/min/mmHg
DLCO unc: 19.37 ml/min/mmHg
FEF 25-75 Post: 3.47 L/sec
FEF 25-75 Pre: 1.92 L/sec
FEF2575-%CHANGE-POST: 80 %
FEF2575-%Pred-Post: 124 %
FEF2575-%Pred-Pre: 69 %
FEV1-%Change-Post: 23 %
FEV1-%PRED-POST: 82 %
FEV1-%Pred-Pre: 66 %
FEV1-Post: 2.32 L
FEV1-Pre: 1.88 L
FEV1FVC-%Change-Post: -2 %
FEV1FVC-%PRED-PRE: 102 %
FEV6-%Change-Post: 27 %
FEV6-%PRED-POST: 83 %
FEV6-%Pred-Pre: 65 %
FEV6-POST: 2.9 L
FEV6-Pre: 2.28 L
FEV6FVC-%PRED-POST: 103 %
FEV6FVC-%Pred-Pre: 103 %
FVC-%Change-Post: 27 %
FVC-%Pred-Post: 81 %
FVC-%Pred-Pre: 64 %
FVC-POST: 2.9 L
FVC-PRE: 2.28 L
PRE FEV1/FVC RATIO: 82 %
Post FEV1/FVC ratio: 80 %
Post FEV6/FVC ratio: 100 %
Pre FEV6/FVC Ratio: 100 %
RV % PRED: 116 %
RV: 2.07 L
TLC % PRED: 94 %
TLC: 4.78 L

## 2015-12-26 MED ORDER — BUDESONIDE-FORMOTEROL FUMARATE 160-4.5 MCG/ACT IN AERO: INHALATION_SPRAY | RESPIRATORY_TRACT | Status: DC

## 2015-12-26 MED ORDER — ACETAMINOPHEN-CODEINE #3 300-30 MG PO TABS: ORAL_TABLET | ORAL | Status: DC

## 2015-12-26 MED ORDER — AZITHROMYCIN 250 MG PO TABS: ORAL_TABLET | ORAL | Status: DC

## 2015-12-26 NOTE — Progress Notes (Signed)
PFT done today. 

## 2015-12-26 NOTE — Progress Notes (Signed)
Subjective:    Patient ID: [name], female    DOB: 1965/12/03,     MRN: [id number]   Brief patient profile:   34 yowf active smoker placed on advair/spiriva only a little better after Rasw[facility] eval 2012 referred to pulmonary clinic 10/24/2015 by Dr Dettinger with worse breathing x feb 2016 with no airflow obst on pfts 01/2015 confirmed also 12/26/2015    History of Present Illness  10/24/2015 1st  Pulmonary office visit/ Edrick Whitehorn   Chief Complaint  Patient presents with  . Pulmonary Consult    Referred by Dr. Ivin Booty Dettinger. Pt c/o SOB x 3 yrs, worse for the past year, worse x 1 yr. She gets SOB when doing chores aroung the house and walking short distances such as to the mailbox. She also c/o cough- occ prod with clear to yellow sputum.   uses 02 but not cpap which could not tolerate > cc some am congestion takes 45-60  m to clear it/ worst in cold weather x years Doe = MMRC3 = can't walk 100 yards even at a slow pace at a flat grade s stopping due to sob    rec The key is to stop smoking completely before smoking completely stops you!  Stop lisinopril / advair and spiriva dulera 100 Take 2 puffs first thing in am and then another 2 puffs about 12 hours later.  Work on inhaler technique:   Continue protonix Take 30-60 min before first meal of the day  GERD     12/26/2015  f/u ov/Nosson Wender re: AB/ pfts normalize p saba  Chief Complaint  Patient presents with  . Follow-up    PFT done today. Breathing is unchanged. She c/o increased cough and wheezing for the past 2 wks. Cough has been prod with bloody sputum.  She is using neb 4 x daily on average. Still smoking- 1/2 ppd.   on plavix/asa with coughing fits prod of mostly white mucus with streaks of brb and bilateral lower chest discomfort with coughing fits Says on dulera but note given 15 days sample last ov and missed her 14 day appt.  No obvious day to day or daytime variability or assoc excess/ purulent sputum or mucus plugs or    chest tightness,   or overt sinus or hb symptoms. No unusual exp hx or h/o childhood pna/ asthma or knowledge of premature birth.  Sleeping ok without nocturnal  or early am exacerbation  of respiratory  c/o's or need for noct saba. Also denies any obvious fluctuation of symptoms with weather or environmental changes or other aggravating or alleviating factors except as outlined above   Current Medications, Allergies, Complete Past Medical History, Past Surgical History, Family History, and Social History were reviewed in [employer] record.  ROS  The following are not active complaints unless bolded sore throat, dysphagia, dental problems, itching, sneezing,  nasal congestion or excess/ purulent secretions, ear ache,   fever, chills, sweats, unintended wt loss, classically exertional cp,  orthopnea pnd or leg swelling, presyncope, palpitations, abdominal pain, anorexia, nausea, vomiting, diarrhea  or change in bowel or bladder habits, change in stools or urine, dysuria,hematuria,  rash, arthralgias, visual complaints, headache, numbness, weakness or ataxia or problems with walking or coordination,  change in mood/affect or memory.               Objective:   Physical Exam  amb obese wf nad with hopeless/ helpless affect    12/26/2015  218  10/24/15 211 lb 3.2 oz (95.8 kg)  10/20/15 210 lb 8 oz (95.482 kg)  09/22/15 213 lb 12.8 oz (96.979 kg)    Vital signs reviewed   HEENT: nl dentition, turbinates, and oropharynx. Nl external ear canals without cough reflex   NECK :  without JVD/Nodes/TM/ nl carotid upstrokes bilaterally   LUNGS: no acc muscle use,  Nl contour chest which is clear to A and P bilaterally without cough on insp or exp maneuvers   CV:  RRR  no s3 or murmur or increase in P2, no edema   ABD:  soft and nontender with nl inspiratory excursion in the supine position. No bruits or organomegaly, bowel sounds nl  MS:  Nl gait/ ext warm  without deformities, calf tenderness, cyanosis or clubbing No obvious joint restrictions   SKIN: warm and dry without lesions    NEURO:  Alert, depressed mood, nl sensorium with  no motor deficits      I personally reviewed images and agree with radiology impression as follows:  CXR:  12/05/15 Stable exam. No acute finding.        Assessment & Plan:

## 2015-12-26 NOTE — Patient Instructions (Addendum)
Start symbicort 160 Take 2 puffs first thing in am and then another 2 puffs about 12 hours later.   Only use your albuterol as a rescue medication to be used if you can't catch your breath by resting or doing a relaxed purse lip breathing pattern.  - The less you use it, the better it will work when you need it. - Ok to use up to every 4 hours  For cough or pain from coughing  > tylenol #3 one every hours as needed  zpak should take care of the bronchitis  Stop plavix until bleeding stops   Work on inhaler technique:  relax and gently blow all the way out then take a nice smooth deep breath back in, triggering the inhaler at same time you start breathing in.  Hold for up to 5 seconds if you can. Blow out thru nose. Rinse and gargle with water when done   If you are satisfied with your treatment plan,  let your doctor know and he/she can either refill your medications or you can return here when your prescription runs out.     If in any way you are not 100% satisfied,  please tell us.  If 100% better, tell your friends!  Pulmonary follow up is as needed

## 2015-12-27 ENCOUNTER — Encounter: Payer: Self-pay | Admitting: Internal Medicine

## 2015-12-27 DIAGNOSIS — J209 Acute bronchitis, unspecified: Secondary | ICD-10-CM | POA: Insufficient documentation

## 2015-12-27 NOTE — Assessment & Plan Note (Signed)
Rx: Zpak/ tylenol #3 for cough/ hold plavix if actively bleeding / f/u prn - see avs

## 2015-12-27 NOTE — Assessment & Plan Note (Signed)
Complicated by HBP/ Hyperlipidemia/ DM  And restrictive changes on spirometry 01/30/15 and ERV 12%  12/26/2015   Body mass index is 37.4    Lab Results  Component Value Date   TSH 0.585 11/09/2015     Contributing to gerd tendency/ doe/reviewed the need and the process to achieve and maintain neg calorie balance > defer f/u primary care including intermittently monitoring thyroid status

## 2015-12-27 NOTE — Assessment & Plan Note (Addendum)
Spirometry 01/30/15   FEV1 1.74 (64%)  Ratio 83 / actively smoking - PFT's  12/26/2015  FEV1 2.32 (82 % ) ratio 80  p 23 % improvement from saba p  No rx prior to study with DLCO  79 % corrects to 80 % for alv volume     - 12/26/2015  extensive coaching HFA effectiveness =    75% > symbicort 160 2bid  DDX of  difficult airways management almost all start with A and  include Adherence, Ace Inhibitors, Acid Reflux, Active Sinus Disease, Alpha 1 Antitripsin deficiency, Anxiety masquerading as Airways dz,  ABPA,  Allergy(esp in young), Aspiration (esp in elderly), Adverse effects of meds,  Active smokers, A bunch of PE's (a small clot burden can't cause this syndrome unless there is already severe underlying pulm or vascular dz with poor reserve) plus two Bs  = Bronchiectasis and Beta blocker use..and one C= CHF  Adherence is always the initial "prime suspect" and is a multilayered concern that requires a "trust but verify" approach in every patient - starting with knowing how to use medications, especially inhalers, correctly, keeping up with refills and understanding the fundamental difference between maintenance and prns vs those medications only taken for a very short course and then stopped and not refilled.  - The proper method of use, as well as anticipated side effects, of a metered-dose inhaler are discussed and demonstrated to the patient. Improved effectiveness after extensive coaching during this visit to a level of approximately 75 % from a baseline of 50 % > should respond nicely to symbicort 160 2bid as pfts normalized p saba here  Active smoking (see separate a/p)   ? Anxiety > usually at the bottom of this list of usual suspects but should be much higher on this pt's based on H and P and note already on psychotropics .   ? Acid (or non-acid) GERD > always difficult to exclude as up to 75% of pts in some series report no assoc GI/ Heartburn symptoms> rec continue max (24h)  acid suppression and  diet restrictions/ reviewed     ? BB > on coreg but since responds so nicely to saba probably not getting much B2 spillover, ok to continue  I had an extended final summary discussion with the patient reviewing all relevant studies completed to date and  lasting 15 to 20 minutes of a 25 minute visit on the following issues:    1) reviewed pfts explaining copd is measured in terms of severity after treatment, not before, and she does not have detectable airflow obst on rx just as someone with the right pair of glasses can see 20/20 - this may not always be the case if she continues smoking, but for now it certainly proved to be true  2) Each maintenance medication was reviewed in detail including most importantly the difference between maintenance and as needed and under what circumstances the prns are to be used.  Please see instructions for details which were reviewed in writing and the patient given a copy.    3) pulmonary f/u is prn

## 2015-12-27 NOTE — Assessment & Plan Note (Signed)

## 2016-01-01 ENCOUNTER — Ambulatory Visit: Payer: Medicaid Other | Admitting: Family Medicine

## 2016-01-03 ENCOUNTER — Other Ambulatory Visit: Payer: Self-pay | Admitting: Family Medicine

## 2016-01-03 ENCOUNTER — Encounter: Payer: Self-pay | Admitting: Family Medicine

## 2016-01-26 ENCOUNTER — Encounter: Payer: Medicaid Other | Admitting: Nurse Practitioner

## 2016-01-26 NOTE — Progress Notes (Signed)
This encounter was created in error - please disregard.

## 2016-02-29 ENCOUNTER — Ambulatory Visit: Payer: Medicaid Other | Admitting: Gastroenterology

## 2016-04-12 ENCOUNTER — Other Ambulatory Visit: Payer: Self-pay | Admitting: Family Medicine

## 2016-04-21 ENCOUNTER — Emergency Department (HOSPITAL_COMMUNITY): Payer: Medicaid Other

## 2016-04-21 ENCOUNTER — Emergency Department (HOSPITAL_COMMUNITY)
Admission: EM | Admit: 2016-04-21 | Discharge: 2016-04-22 | Disposition: A | Discharge status: Home / Self Care | Payer: Medicaid Other | Attending: Emergency Medicine | Admitting: Emergency Medicine

## 2016-04-21 ENCOUNTER — Encounter (HOSPITAL_COMMUNITY): Payer: Self-pay

## 2016-04-21 DIAGNOSIS — I10 Essential (primary) hypertension: Secondary | ICD-10-CM | POA: Insufficient documentation

## 2016-04-21 DIAGNOSIS — R110 Nausea: Secondary | ICD-10-CM | POA: Insufficient documentation

## 2016-04-21 DIAGNOSIS — Z7982 Long term (current) use of aspirin: Secondary | ICD-10-CM | POA: Diagnosis not present

## 2016-04-21 DIAGNOSIS — J449 Chronic obstructive pulmonary disease, unspecified: Secondary | ICD-10-CM | POA: Insufficient documentation

## 2016-04-21 DIAGNOSIS — R079 Chest pain, unspecified: Secondary | ICD-10-CM | POA: Diagnosis not present

## 2016-04-21 DIAGNOSIS — Z79899 Other long term (current) drug therapy: Secondary | ICD-10-CM | POA: Diagnosis not present

## 2016-04-21 DIAGNOSIS — Z7984 Long term (current) use of oral hypoglycemic drugs: Secondary | ICD-10-CM | POA: Insufficient documentation

## 2016-04-21 DIAGNOSIS — J45909 Unspecified asthma, uncomplicated: Secondary | ICD-10-CM | POA: Insufficient documentation

## 2016-04-21 DIAGNOSIS — R0602 Shortness of breath: Secondary | ICD-10-CM | POA: Insufficient documentation

## 2016-04-21 DIAGNOSIS — Z8673 Personal history of transient ischemic attack (TIA), and cerebral infarction without residual deficits: Secondary | ICD-10-CM | POA: Insufficient documentation

## 2016-04-21 DIAGNOSIS — R6884 Jaw pain: Secondary | ICD-10-CM | POA: Diagnosis present

## 2016-04-21 DIAGNOSIS — E119 Type 2 diabetes mellitus without complications: Secondary | ICD-10-CM | POA: Diagnosis not present

## 2016-04-21 DIAGNOSIS — F17210 Nicotine dependence, cigarettes, uncomplicated: Secondary | ICD-10-CM | POA: Insufficient documentation

## 2016-04-21 DIAGNOSIS — Z794 Long term (current) use of insulin: Secondary | ICD-10-CM | POA: Diagnosis not present

## 2016-04-21 LAB — CBC
HCT: 42.4 % (ref 36.0–46.0)
HEMOGLOBIN: 14.1 g/dL (ref 12.0–15.0)
MCH: 30.5 pg (ref 26.0–34.0)
MCHC: 33.3 g/dL (ref 30.0–36.0)
MCV: 91.8 fL (ref 78.0–100.0)
Platelets: 135 10*3/uL — ABNORMAL LOW (ref 150–400)
RBC: 4.62 MIL/uL (ref 3.87–5.11)
RDW: 12 % (ref 11.5–15.5)
WBC: 8.7 10*3/uL (ref 4.0–10.5)

## 2016-04-21 LAB — BASIC METABOLIC PANEL
ANION GAP: 6 (ref 5–15)
BUN: 9 mg/dL (ref 6–20)
CHLORIDE: 108 mmol/L (ref 101–111)
CO2: 23 mmol/L (ref 22–32)
Calcium: 8.9 mg/dL (ref 8.9–10.3)
Creatinine, Ser: 0.77 mg/dL (ref 0.44–1.00)
GFR calc Af Amer: >60 mL/min (ref >60)
GFR calc non Af Amer: >60 mL/min (ref >60)
Glucose, Bld: 395 mg/dL — ABNORMAL HIGH (ref 65–99)
POTASSIUM: 3.6 mmol/L (ref 3.5–5.1)
Sodium: 137 mmol/L (ref 135–145)

## 2016-04-21 LAB — TROPONIN I

## 2016-04-21 NOTE — ED Triage Notes (Signed)
Bilateral jaw pain with left neck, left arm pain that started at 10am. Denies recent injury

## 2016-04-22 LAB — TROPONIN I

## 2016-04-22 MED ORDER — GABAPENTIN 300 MG PO CAPS: 300.0000 mg | ORAL_CAPSULE | Freq: Three times a day (TID) | ORAL | 0 refills | Status: DC

## 2016-04-22 MED ORDER — CARVEDILOL 12.5 MG PO TABS: 12.5000 mg | ORAL_TABLET | Freq: Two times a day (BID) | ORAL | 0 refills | Status: DC

## 2016-04-22 MED ORDER — ASPIRIN 325 MG PO TABS
325.0000 mg | ORAL_TABLET | Freq: Once | ORAL | Status: AC
Start: 2016-04-22 — End: 2016-04-22
  Administered 2016-04-22: 325 mg via ORAL
  Filled 2016-04-22: qty 1

## 2016-04-22 MED ORDER — ACETAMINOPHEN 500 MG PO TABS
1000.0000 mg | ORAL_TABLET | Freq: Once | ORAL | Status: AC
  Administered 2016-04-22: 1000 mg via ORAL
  Filled 2016-04-22: qty 2

## 2016-04-22 MED ORDER — INSULIN ASPART 100 UNIT/ML ~~LOC~~ SOLN
5.0000 [IU] | Freq: Once | SUBCUTANEOUS | Status: AC
  Administered 2016-04-22: 5 [IU] via INTRAVENOUS
  Filled 2016-04-22: qty 1

## 2016-04-22 MED ORDER — NITROGLYCERIN 2 % TD OINT
1.0000 [in_us] | TOPICAL_OINTMENT | Freq: Once | TRANSDERMAL | Status: AC
  Administered 2016-04-22: 1 [in_us] via TOPICAL
  Filled 2016-04-22: qty 1

## 2016-04-22 MED ORDER — KETOROLAC TROMETHAMINE 30 MG/ML IJ SOLN
30.0000 mg | Freq: Once | INTRAMUSCULAR | Status: AC
  Administered 2016-04-22: 30 mg via INTRAVENOUS
  Filled 2016-04-22: qty 1

## 2016-04-22 MED ORDER — SERTRALINE HCL 100 MG PO TABS: ORAL_TABLET | ORAL | 0 refills | Status: DC

## 2016-04-22 MED ORDER — POTASSIUM CHLORIDE CRYS ER 20 MEQ PO TBCR: 20.0000 meq | EXTENDED_RELEASE_TABLET | Freq: Every day | ORAL | 0 refills | Status: DC

## 2016-04-22 MED ORDER — FUROSEMIDE 40 MG PO TABS: 40.0000 mg | ORAL_TABLET | Freq: Every morning | ORAL | 0 refills | Status: DC

## 2016-04-22 MED ORDER — SODIUM CHLORIDE 0.9 % IV BOLUS (SEPSIS)
1000.0000 mL | Freq: Once | INTRAVENOUS | Status: AC
  Administered 2016-04-22: 1000 mL via INTRAVENOUS

## 2016-04-22 MED ORDER — SIMVASTATIN 40 MG PO TABS: 40.0000 mg | ORAL_TABLET | Freq: Every day | ORAL | 0 refills | Status: DC

## 2016-04-22 MED ORDER — CLOPIDOGREL BISULFATE 75 MG PO TABS: 75.0000 mg | ORAL_TABLET | Freq: Every day | ORAL | 0 refills | Status: DC

## 2016-04-22 MED ORDER — PANTOPRAZOLE SODIUM 40 MG PO TBEC: DELAYED_RELEASE_TABLET | ORAL | 0 refills | Status: DC

## 2016-04-22 MED ORDER — NITROGLYCERIN 0.4 MG SL SUBL: 0.4000 mg | SUBLINGUAL_TABLET | SUBLINGUAL | 0 refills | Status: AC | PRN

## 2016-04-22 NOTE — ED Provider Notes (Signed)
[facility]-EMERGENCY DEPT Provider Note   CSN: [id number] Arrival date & time: 04/21/16  2205   First MD Initiated Contact with Patient 04/22/16 0006    By signing my name below, I, Bethel Born, attest that this documentation has been prepared under the direction and in the presence of Devoria Albe, MD. Electronically Signed: Bethel Born, ED Scribe. 04/22/16. 2:59 AM  History   Chief Complaint Chief Complaint  Patient presents with  . Jaw Pain    HPI  The history is provided by the patient. No language interpreter was used.   [name] is a 50 y.o. female with PMHx of cardiac dysrhythmia with a pacemaker, COPD, HTN,  who presents to the Emergency Department complaining of constant bilateral jaw pain with onset approximately 14 hours ago while in bed. She denies any factors that modify the jaw pain.  The pain radiates to the left side of the neck and the left arm. She also complains of sharp/stabbing, intermittent central chest pain. The episodes of chest pain last for 5-10 minutes and occur spontaneously. She is out of NTG and Tums provided insufficient pain relief at home. Associated symptoms include SOB, sweating, and nausea. Pt denies increased cough, vomiting, and new LE swelling. She did not take aspirin today. Pt no longer has a PCP. She is out of Coreg, Plavix, Zoloft, Zocor, potassium, Lasix, gabapentin. She is not out of insulin, metformin, or Symbicort. The pt smokes 1 PPD.   PCP none  Past Medical History:  Diagnosis Date  . Arthritis   . Cardiac pacemaker in situ 2009   DDD [employer] -- [place] 60  . COPD with asthma (HCC)    GOLD 2-3 --  pulmologist (last visit 2011) Dr. Marchelle Gearing  . Crohn's disease (HCC)    Large intestine  . Depression 2016   PTSD  . Essential hypertension   . Gastroparesis   . GERD (gastroesophageal reflux disease)   . History of hiatal hernia   . History of kidney stones   . History of stroke    Jun 2011 -- right hand weakness    . History of syncope   . Inappropriate sinus tachycardia (HCC)    Sinus node modification 02-25-2003 by Dr. Lewayne Bunting  . LLQ abdominal tenderness 10/20/2015  . OSA (obstructive sleep apnea)    Study done 2005 -- pt refused CPAP/previously was using nocturnal oxygen until one year ago pt states PCP is monitoring pt without  . Pelvic pain in female   . Sinus node dysfunction (HCC)    Symptomatic bradycardia  . Symptomatic sinus bradycardia   . Type 2 diabetes mellitus (HCC)   . Wears glasses     Patient Active Problem List   Diagnosis Date Noted  . Acute bronchitis 12/27/2015  . Upper airway cough syndrome 10/24/2015  . Morbid obesity (HCC) 10/24/2015  . Essential hypertension 10/24/2015  . LLQ abdominal tenderness 10/20/2015  . Diabetic neuropathy (HCC) 08/02/2015  . Depression 07/04/2015  . COPD GOLD 0 / still smoking  07/04/2015  . OSA (obstructive sleep apnea) 07/04/2015  . Other specified cardiac dysrhythmias(427.89)   . Diabetes mellitus type 2, uncontrolled (HCC) 08/22/2010  . MIXED HYPERLIPIDEMIA 11/20/2009  . Cigarette smoker 11/03/2009  . IBS 10/23/2009  . CARDIAC PACEMAKER IN SITU 08/29/2009  . ESOPHAGEAL STRICTURE 06/28/2009  . HYPERTENSION, BENIGN ESSENTIAL 05/11/2009  . ESOPHAGEAL REFLUX 03/16/2009  . GASTROPARESIS 03/16/2009    Past Surgical History:  Procedure Laterality Date  . BILATERAL KNEE ARTHROSCOPY W/ CHONDROMALACIA  PATELLA  bilateral ---- 12-26-2010; 08-14-2009;  04-25-2008;  03-09-2007   additional same surgery, Left knee 2005;  x2 2006 ---  Right knee 2005;  2006;  x2  2007  . CARDIAC CATHETERIZATION  05-08-2001  dr Nicki Guadalajara   normal coronary arteries and LVF  . CARDIAC ELECTROPHYSIOLOGY STUDY W/  SINUS NODE MODIFICATION  02-25-2003  dr Sharlot Gowda taylor  . CARDIAC PACEMAKER PLACEMENT  10-16-2007  dr Lewayne Bunting   DDD-- [facility] 60  . CARDIOVASCULAR STRESS TEST  08-02-2010  dr Diona Browner   normal lexiscan study/  ef 59%  .  CESAREAN SECTION  x2  . CHOLECYSTECTOMY  1994  . CYSTO/  URETEROSCOPIC STONE EXTRACTION  03/ 2005  . CYSTOSCOPY WITH HYDRODISTENSION AND BIOPSY N/A 05/23/2015   Procedure: CYSTOSCOPY/BIOPSY/HYDRODISTENSION;  Surgeon: Su Grand, MD;  Location: [facility];  Service: Urology;  Laterality: N/A;  . TONSILLECTOMY  11-25-2002    OB History    Gravida Para Term Preterm AB Living   5 2     3 2    SAB TAB Ectopic Multiple Live Births   3               Home Medications    Prior to Admission medications   Medication Sig Start Date End Date Taking? Authorizing Provider  acetaminophen-codeine (TYLENOL #3) 300-30 MG tablet One every 4 hours as needed for cough 12/26/15   Nyoka Cowden, MD  albuterol (PROVENTIL) (2.5 MG/3ML) 0.083% nebulizer solution Take 3 mLs (2.5 mg total) by nebulization every 6 (six) hours as needed for wheezing or shortness of breath. 11/09/15   Elige Radon Dettinger, MD  aspirin EC 81 MG tablet Take 81 mg by mouth daily.    Historical Provider, MD  azithromycin (ZITHROMAX) 250 MG tablet Take 2 on day one then 1 daily x 4 days 12/26/15   Nyoka Cowden, MD  BD PEN NEEDLE NANO U/F 32G X 4 MM MISC 1 each by Does not apply route 4 (four) times daily. 11/09/15   Elige Radon Dettinger, MD  budesonide-formoterol (SYMBICORT) 160-4.5 MCG/ACT inhaler Inhale 2 puffs into the lungs every 12 (twelve) hours.    Historical Provider, MD  carvedilol (COREG) 12.5 MG tablet Take 1 tablet (12.5 mg total) by mouth 2 (two) times daily with a meal. 04/22/16   Devoria Albe, MD  clopidogrel (PLAVIX) 75 MG tablet Take 1 tablet (75 mg total) by mouth daily. 04/22/16   Devoria Albe, MD  diclofenac sodium (VOLTAREN) 1 % GEL Apply 4 g topically 4 (four) times daily. 11/09/15   Elige Radon Dettinger, MD  furosemide (LASIX) 40 MG tablet Take 1 tablet (40 mg total) by mouth every morning. 04/22/16   Devoria Albe, MD  gabapentin (NEURONTIN) 300 MG capsule Take 1 capsule (300 mg total) by mouth 3 (three) times daily. 04/22/16    Devoria Albe, MD  hydrOXYzine (ATARAX/VISTARIL) 25 MG tablet Take 1-2 tablets (25-50 mg total) by mouth every 6 (six) hours as needed for anxiety. 12/05/15   Kristen N Ward, DO  ibuprofen (ADVIL,MOTRIN) 800 MG tablet TAKE 1 TABLET(800 MG) BY MOUTH EVERY 8 HOURS AS NEEDED 04/15/16   Elige Radon Dettinger, MD  insulin aspart (NOVOLOG) 100 UNIT/ML injection Inject 15 Units into the skin 3 (three) times daily before meals. 11/09/15   Elige Radon Dettinger, MD  LEVEMIR FLEXTOUCH 100 UNIT/ML Pen Inject 80 Units into the skin daily at 10 pm. 11/09/15   Elige Radon Dettinger, MD  metFORMIN (GLUCOPHAGE) 1000  MG tablet Take 1 tablet (1,000 mg total) by mouth 2 (two) times daily with a meal. 11/09/15   Elige Radon Dettinger, MD  nitroGLYCERIN (NITROSTAT) 0.4 MG SL tablet Place 1 tablet (0.4 mg total) under the tongue every 5 (five) minutes as needed for chest pain. 04/22/16   Devoria Albe, MD  NOVOLOG FLEXPEN 100 UNIT/ML FlexPen ADM 15 UNITS [place] TID B MEALS 11/09/15   Historical Provider, MD  OLANZapine (ZYPREXA) 5 MG tablet Take 1 tablet (5 mg total) by mouth at bedtime. 11/09/15   Elige Radon Dettinger, MD  ondansetron (ZOFRAN ODT) 4 MG disintegrating tablet Take 1 tablet (4 mg total) by mouth every 8 (eight) hours as needed for nausea or vomiting. 11/15/15   Kristen N Ward, DO  OXYGEN Inhale 3 L/day into the lungs at bedtime.    Historical Provider, MD  pantoprazole (PROTONIX) 40 MG tablet TAKE 1 TABLET BY MOUTH DAILY FOR STOMACH 04/22/16   Devoria Albe, MD  potassium chloride SA (K-DUR,KLOR-CON) 20 MEQ tablet Take 1 tablet (20 mEq total) by mouth daily. 04/22/16   Devoria Albe, MD  sertraline (ZOLOFT) 100 MG tablet TAKE 1 TABLET(100 MG) BY MOUTH DAILY 04/22/16   Devoria Albe, MD  simvastatin (ZOCOR) 40 MG tablet Take 1 tablet (40 mg total) by mouth at bedtime. 04/22/16   Devoria Albe, MD  tiZANidine (ZANAFLEX) 4 MG tablet TAKE 3 TABLETS(12 MG) BY MOUTH AT BEDTIME 01/04/16   Elige Radon Dettinger, MD    Family History Family History  Problem Relation  Age of Onset  . Diabetes Mother   . Asthma Mother   . Heart disease Father     Deceased. MI. Mother, 2 brothers, sister, nephew also have heart disease  . Emphysema Father     Died of it. Was pt of Dr. Sherene Sires   . Heart attack Father   . Cancer Father     ? type  . Diabetes Brother   . Diabetes Sister   . Colon cancer Neg Hx     Social History Social History  Substance Use Topics  . Smoking status: Current Every Day Smoker    Packs/day: 1.50    Years: 42.00    Types: Cigarettes    Start date: 02/02/1973  . Smokeless tobacco: Never Used     Comment: 1 - 2 ppd depends on stress  . Alcohol use No  on disability   Allergies   Amoxicillin and Flexeril [cyclobenzaprine]   Review of Systems Review of Systems  Constitutional: Positive for diaphoresis.  HENT:       Jaw pain   Respiratory: Positive for shortness of breath. Negative for cough.   Cardiovascular: Positive for chest pain. Negative for leg swelling.  Gastrointestinal: Positive for nausea. Negative for vomiting.  All other systems reviewed and are negative.    Physical Exam Updated Vital Signs BP 184/81   Pulse 73   Temp 98.2 F (36.8 C) (Oral)   Resp 21   Ht 5\' 4"  (1.626 m)   Wt 210 lb (95.3 kg)   SpO2 98%   BMI 36.05 kg/m   Physical Exam  Constitutional: She is oriented to person, place, and time. She appears well-developed and well-nourished.  Non-toxic appearance. She does not appear ill. No distress.  HENT:  Head: Normocephalic and atraumatic.  Right Ear: External ear normal.  Left Ear: External ear normal.  Nose: Nose normal. No mucosal edema or rhinorrhea.  Mouth/Throat: Oropharynx is clear and moist and mucous membranes are normal. No  dental abscesses or uvula swelling.  Eyes: Conjunctivae and EOM are normal. Pupils are equal, round, and reactive to light.  Neck: Normal range of motion and full passive range of motion without pain. Neck supple.  Cardiovascular: Normal rate, regular rhythm and  normal heart sounds.  Exam reveals no gallop and no friction rub.   No murmur heard. Pulmonary/Chest: Effort normal and breath sounds normal. No respiratory distress. She has no wheezes. She has no rhonchi. She has no rales. She exhibits tenderness. She exhibits no crepitus.    Tenderness at upper chest wall   Abdominal: Soft. Normal appearance and bowel sounds are normal. She exhibits no distension. There is no tenderness. There is no rebound and no guarding.  Musculoskeletal: Normal range of motion. She exhibits no edema or tenderness.  Moves all extremities well.   Neurological: She is alert and oriented to person, place, and time. She has normal strength. No cranial nerve deficit.  Skin: Skin is warm, dry and intact. No rash noted. No pallor.  Skin very red at the upper chest and neck, which she states is chronic.   Psychiatric: She has a normal mood and affect. Her speech is normal and behavior is normal. Her mood appears not anxious.  Nursing note and vitals reviewed.    ED Treatments / Results  Labs (all labs ordered are listed, but only abnormal results are displayed) Results for orders placed or performed during the hospital encounter of 04/21/16  Basic metabolic panel  Result Value Ref Range   Sodium 137 135 - 145 mmol/L   Potassium 3.6 3.5 - 5.1 mmol/L   Chloride 108 101 - 111 mmol/L   CO2 23 22 - 32 mmol/L   Glucose, Bld 395 (H) 65 - 99 mg/dL   BUN 9 6 - 20 mg/dL   Creatinine, Ser 4.09 0.44 - 1.00 mg/dL   Calcium 8.9 8.9 - 81.1 mg/dL   GFR calc non Af Amer >60 >60 mL/min   GFR calc Af Amer >60 >60 mL/min   Anion gap 6 5 - 15  CBC  Result Value Ref Range   WBC 8.7 4.0 - 10.5 K/uL   RBC 4.62 3.87 - 5.11 MIL/uL   Hemoglobin 14.1 12.0 - 15.0 g/dL   HCT 91.4 78.2 - 95.6 %   MCV 91.8 78.0 - 100.0 fL   MCH 30.5 26.0 - 34.0 pg   MCHC 33.3 30.0 - 36.0 g/dL   RDW 21.3 08.6 - 57.8 %   Platelets 135 (L) 150 - 400 K/uL  Troponin I  Result Value Ref Range   Troponin I  <0.03 <0.03 ng/mL  Troponin I  Result Value Ref Range   Troponin I <0.03 <0.03 ng/mL   Laboratory interpretation all normal     EKG ED ECG REPORT   Date: 04/22/2016  Rate: 79  Rhythm: normal sinus rhythm  QRS Axis: normal  Intervals: normal  ST/T Wave abnormalities: normal  Conduction Disutrbances:none  Narrative Interpretation: Q waves septal leads  Old EKG Reviewed: none available  I have personally reviewed the EKG tracing and agree with the computerized printout as noted.   Radiology Dg Chest 2 View  Result Date: 04/21/2016 CLINICAL DATA:  Chest, jaw, and left arm pain. Symptom onset 1 hour prior. EXAM: CHEST  2 VIEW COMPARISON:  Radiographs 12/05/2015 FINDINGS: Dual lead left-sided pacemaker remains in place. The cardiomediastinal contours are normal. The lungs are clear. Pulmonary vasculature is normal. No consolidation, pleural effusion, or pneumothorax. No acute osseous abnormalities  are seen. IMPRESSION: No acute pulmonary process. Electronically Signed   By: Rubye Oaks M.D.   On: 04/21/2016 23:00   Procedures Procedures (including critical care time)  Medications Ordered in ED Medications  aspirin tablet 325 mg (325 mg Oral Given 04/22/16 0031)  nitroGLYCERIN (NITROGLYN) 2 % ointment 1 inch (1 inch Topical Given 04/22/16 0032)  sodium chloride 0.9 % bolus 1,000 mL (0 mLs Intravenous Stopped 04/22/16 0253)  insulin aspart (novoLOG) injection 5 Units (5 Units Intravenous Given 04/22/16 0032)  ketorolac (TORADOL) 30 MG/ML injection 30 mg (30 mg Intravenous Given 04/22/16 0032)  acetaminophen (TYLENOL) tablet 1,000 mg (1,000 mg Oral Given 04/22/16 0115)     Initial Impression / Assessment and Plan / ED Course  I have reviewed the triage vital signs and the nursing notes.  Pertinent labs & imaging results that were available during my care of the patient were reviewed by me and considered in my medical decision making (see chart for details).  Clinical Course     COORDINATION OF CARE: 12:17 AM Discussed treatment plan which includes lab work, CXR, EKG, insulinAnd IV fluids for her hyperglycemia, aspirin and NTG for her chest pain, and IVF with pt at bedside and pt agreed to plan.  Patient was given acetaminophen for her nitroglycerin headache.  Patient was rechecked at 2 AM. She states she's feeling better. We are waiting for her second, delta troponin to be drawn. If it is normal she will be discharged home. Patient has a history of heart disease however this pain does not sound cardiac. Patient has run out of the majority of her medications which may be contributing to her chest pains.  Patient Stelter troponins were negative. She was discharged home.  Final Clinical Impressions(s) / ED Diagnoses   Final diagnoses:  Chest pain at rest   New Prescriptions   NITROGLYCERIN (NITROSTAT) 0.4 MG SL TABLET    Place 1 tablet (0.4 mg total) under the tongue every 5 (five) minutes as needed for chest pain.   Modified Medications   Modified Medication Previous Medication   CARVEDILOL (COREG) 12.5 MG TABLET carvedilol (COREG) 12.5 MG tablet      Take 1 tablet (12.5 mg total) by mouth 2 (two) times daily with a meal.    Take 1 tablet (12.5 mg total) by mouth 2 (two) times daily with a meal.   CLOPIDOGREL (PLAVIX) 75 MG TABLET clopidogrel (PLAVIX) 75 MG tablet      Take 1 tablet (75 mg total) by mouth daily.    Take 1 tablet (75 mg total) by mouth daily.   FUROSEMIDE (LASIX) 40 MG TABLET furosemide (LASIX) 40 MG tablet      Take 1 tablet (40 mg total) by mouth every morning.    Take 1 tablet (40 mg total) by mouth every morning.   GABAPENTIN (NEURONTIN) 300 MG CAPSULE gabapentin (NEURONTIN) 300 MG capsule      Take 1 capsule (300 mg total) by mouth 3 (three) times daily.    Take 1 capsule (300 mg total) by mouth 3 (three) times daily.   PANTOPRAZOLE (PROTONIX) 40 MG TABLET pantoprazole (PROTONIX) 40 MG tablet      TAKE 1 TABLET BY MOUTH DAILY FOR STOMACH     TAKE 1 TABLET BY MOUTH DAILY FOR STOMACH   POTASSIUM CHLORIDE SA (K-DUR,KLOR-CON) 20 MEQ TABLET potassium chloride SA (K-DUR,KLOR-CON) 20 MEQ tablet      Take 1 tablet (20 mEq total) by mouth daily.    Take 1 tablet (  20 mEq total) by mouth daily.   SERTRALINE (ZOLOFT) 100 MG TABLET sertraline (ZOLOFT) 100 MG tablet      TAKE 1 TABLET(100 MG) BY MOUTH DAILY    TAKE 1 TABLET(100 MG) BY MOUTH DAILY   SIMVASTATIN (ZOCOR) 40 MG TABLET simvastatin (ZOCOR) 40 MG tablet      Take 1 tablet (40 mg total) by mouth at bedtime.    Take 1 tablet (40 mg total) by mouth at bedtime.    Plan discharge  I personally performed the services described in this documentation, which was scribed in my presence. The recorded information has been reviewed and considered.  Devoria Albe, MD, Concha Pyo, MD 04/22/16 [phone]

## 2016-04-22 NOTE — Discharge Instructions (Signed)
I refilled the medication you ran out of for one month. You need to find a new primary care doctor. I gave you two options to try. Use the nitroglycerin sublingually if you get the chest pain again. Return to the ED if you get constant chest pain that lasts more than 30 minutes. You need to restart your baby aspirin daily.

## 2016-05-28 ENCOUNTER — Ambulatory Visit: Payer: Medicaid Other | Admitting: Cardiology

## 2016-06-06 ENCOUNTER — Encounter: Payer: Self-pay | Admitting: Physician Assistant

## 2016-06-06 ENCOUNTER — Ambulatory Visit: Payer: Medicaid Other | Admitting: Physician Assistant

## 2016-06-06 VITALS — BP 140/84 | HR 60 | Temp 97.2°F | Ht 64.0 in | Wt 201.2 lb

## 2016-06-06 DIAGNOSIS — E669 Obesity, unspecified: Secondary | ICD-10-CM

## 2016-06-06 DIAGNOSIS — E785 Hyperlipidemia, unspecified: Secondary | ICD-10-CM

## 2016-06-06 DIAGNOSIS — E118 Type 2 diabetes mellitus with unspecified complications: Secondary | ICD-10-CM

## 2016-06-06 DIAGNOSIS — F32A Depression, unspecified: Secondary | ICD-10-CM

## 2016-06-06 DIAGNOSIS — I10 Essential (primary) hypertension: Secondary | ICD-10-CM

## 2016-06-06 DIAGNOSIS — Z950 Presence of cardiac pacemaker: Secondary | ICD-10-CM

## 2016-06-06 DIAGNOSIS — Z1239 Encounter for other screening for malignant neoplasm of breast: Secondary | ICD-10-CM

## 2016-06-06 DIAGNOSIS — F329 Major depressive disorder, single episode, unspecified: Secondary | ICD-10-CM

## 2016-06-06 DIAGNOSIS — J449 Chronic obstructive pulmonary disease, unspecified: Secondary | ICD-10-CM

## 2016-06-06 DIAGNOSIS — F17219 Nicotine dependence, cigarettes, with unspecified nicotine-induced disorders: Secondary | ICD-10-CM

## 2016-06-06 LAB — GLUCOSE, POCT (MANUAL RESULT ENTRY): POC Glucose: 253 mg/dl — AB (ref 70–99)

## 2016-06-06 MED ORDER — NYSTATIN 100000 UNIT/ML MT SUSP: 5.0000 mL | Freq: Four times a day (QID) | OROMUCOSAL | 0 refills | Status: DC

## 2016-06-06 MED ORDER — CARVEDILOL 12.5 MG PO TABS: 12.5000 mg | ORAL_TABLET | Freq: Two times a day (BID) | ORAL | 0 refills | Status: DC

## 2016-06-06 MED ORDER — LEVEMIR FLEXTOUCH 100 UNIT/ML ~~LOC~~ SOPN: 80.0000 [IU] | PEN_INJECTOR | Freq: Every day | SUBCUTANEOUS | 0 refills | Status: DC

## 2016-06-06 MED ORDER — INSULIN ASPART 100 UNIT/ML ~~LOC~~ SOLN: 15.0000 [IU] | Freq: Three times a day (TID) | SUBCUTANEOUS | 0 refills | Status: DC

## 2016-06-06 MED ORDER — METFORMIN HCL 1000 MG PO TABS: 1000.0000 mg | ORAL_TABLET | Freq: Two times a day (BID) | ORAL | 1 refills | Status: DC

## 2016-06-06 MED ORDER — CLOPIDOGREL BISULFATE 75 MG PO TABS: 75.0000 mg | ORAL_TABLET | Freq: Every day | ORAL | 0 refills | Status: DC

## 2016-06-06 MED ORDER — BUDESONIDE-FORMOTEROL FUMARATE 160-4.5 MCG/ACT IN AERO: 2.0000 | INHALATION_SPRAY | Freq: Two times a day (BID) | RESPIRATORY_TRACT | 1 refills | Status: DC

## 2016-06-06 NOTE — Patient Instructions (Signed)
Get labs drawn Call [facility] or go to walk-in at [facility] for anxiety/ptsd Schedule/reschedule appointments:  Cardiologist, eye doctor, pain clinic We will call you with mammogram appointment We will let you know about gastroenterologist

## 2016-06-06 NOTE — Progress Notes (Signed)
BP 140/84 (BP Location: Left Arm, Patient Position: Sitting, Cuff Size: Normal)   Pulse 60   Temp 97.2 F (36.2 C)   Ht 5\' 4"  (1.626 m)   Wt 201 lb 4 oz (91.3 kg)   SpO2 98%   BMI 34.54 kg/m    Subjective:    Patient ID: [name], female    DOB: 08/13/1966, 50 y.o.   MRN: [id number]  HPI: Meagan Mesaonya S Stepka is a 50 y.o. female presenting on 06/06/2016 for New Patient (Initial Visit) (NFBS now is 253)   HPI   -[facility] but [facility] dismissed due to no-shows.  -specialists:  pulm- dr Sherene SiresWert- for copd                      Cards- dr Diona Brownermcdowell and dr Ladona Ridgeltaylor- pacemaker                      obgyn- dr Alfred LevinsFerguson                      Gastro- dr Creta LevinStallings Corinda Gubler()- pt reports crohns but all that can be found in EPIC is IBS  -[facility]  Went only once and says was too busy to get back there (going to court).  She states anxiety and ptsd.  -complex history- reports from previous office visits reviewed  Relevant past medical, surgical, family and social history reviewed and updated as indicated. Interim medical history since our last visit reviewed. Allergies and medications reviewed and updated.   Current Outpatient Prescriptions:  .  albuterol (PROVENTIL) (2.5 MG/3ML) 0.083% nebulizer solution, Take 3 mLs (2.5 mg total) by nebulization every 6 (six) hours as needed for wheezing or shortness of breath., Disp: 75 mL, Rfl: 12 .  aspirin EC 81 MG tablet, Take 81 mg by mouth daily., Disp: , Rfl:  .  BD PEN NEEDLE NANO U/F 32G X 4 MM MISC, 1 each by Does not apply route 4 (four) times daily., Disp: 120 each, Rfl: 11 .  nitroGLYCERIN (NITROSTAT) 0.4 MG SL tablet, Place 1 tablet (0.4 mg total) under the tongue every 5 (five) minutes as needed for chest pain., Disp: 30 tablet, Rfl: 0 .  OXYGEN, Inhale 3 L/day into the lungs at bedtime., Disp: , Rfl:  .  acetaminophen-codeine (TYLENOL #3) 300-30 MG tablet, One every 4 hours as needed for cough (Patient not taking:  Reported on 06/06/2016), Disp: 40 tablet, Rfl: 0 .  azithromycin (ZITHROMAX) 250 MG tablet, Take 2 on day one then 1 daily x 4 days (Patient not taking: Reported on 06/06/2016), Disp: 6 tablet, Rfl: 0 .  budesonide-formoterol (SYMBICORT) 160-4.5 MCG/ACT inhaler, Inhale 2 puffs into the lungs every 12 (twelve) hours., Disp: , Rfl:  .  carvedilol (COREG) 12.5 MG tablet, Take 1 tablet (12.5 mg total) by mouth 2 (two) times daily with a meal. (Patient not taking: Reported on 06/06/2016), Disp: 60 tablet, Rfl: 0 .  clopidogrel (PLAVIX) 75 MG tablet, Take 1 tablet (75 mg total) by mouth daily. (Patient not taking: Reported on 06/06/2016), Disp: 30 tablet, Rfl: 0 .  diclofenac sodium (VOLTAREN) 1 % GEL, Apply 4 g topically 4 (four) times daily. (Patient not taking: Reported on 06/06/2016), Disp: 200 g, Rfl: 11 .  furosemide (LASIX) 40 MG tablet, Take 1 tablet (40 mg total) by mouth every morning. (Patient not taking: Reported on 06/06/2016), Disp: 30 tablet, Rfl: 0 .  gabapentin (  NEURONTIN) 300 MG capsule, Take 1 capsule (300 mg total) by mouth 3 (three) times daily. (Patient not taking: Reported on 06/06/2016), Disp: 90 capsule, Rfl: 0 .  hydrOXYzine (ATARAX/VISTARIL) 25 MG tablet, Take 1-2 tablets (25-50 mg total) by mouth every 6 (six) hours as needed for anxiety. (Patient not taking: Reported on 06/06/2016), Disp: 20 tablet, Rfl: 0 .  ibuprofen (ADVIL,MOTRIN) 800 MG tablet, TAKE 1 TABLET(800 MG) BY MOUTH EVERY 8 HOURS AS NEEDED (Patient not taking: Reported on 06/06/2016), Disp: 60 tablet, Rfl: 0 .  insulin aspart (NOVOLOG) 100 UNIT/ML injection, Inject 15 Units into the skin 3 (three) times daily before meals. (Patient not taking: Reported on 06/06/2016), Disp: 15 mL, Rfl: 11 .  LEVEMIR FLEXTOUCH 100 UNIT/ML Pen, Inject 80 Units into the skin daily at 10 pm. (Patient not taking: Reported on 06/06/2016), Disp: 15 mL, Rfl: 11 .  metFORMIN (GLUCOPHAGE) 1000 MG tablet, Take 1 tablet (1,000 mg total) by mouth 2 (two)  times daily with a meal. (Patient not taking: Reported on 06/06/2016), Disp: 180 tablet, Rfl: 3 .  OLANZapine (ZYPREXA) 5 MG tablet, Take 1 tablet (5 mg total) by mouth at bedtime. (Patient not taking: Reported on 06/06/2016), Disp: 30 tablet, Rfl: 1 .  ondansetron (ZOFRAN ODT) 4 MG disintegrating tablet, Take 1 tablet (4 mg total) by mouth every 8 (eight) hours as needed for nausea or vomiting. (Patient not taking: Reported on 06/06/2016), Disp: 20 tablet, Rfl: 0 .  pantoprazole (PROTONIX) 40 MG tablet, TAKE 1 TABLET BY MOUTH DAILY FOR STOMACH (Patient not taking: Reported on 06/06/2016), Disp: 30 tablet, Rfl: 0 .  potassium chloride SA (K-DUR,KLOR-CON) 20 MEQ tablet, Take 1 tablet (20 mEq total) by mouth daily. (Patient not taking: Reported on 06/06/2016), Disp: 30 tablet, Rfl: 0 .  sertraline (ZOLOFT) 100 MG tablet, TAKE 1 TABLET(100 MG) BY MOUTH DAILY (Patient not taking: Reported on 06/06/2016), Disp: 30 tablet, Rfl: 0 .  simvastatin (ZOCOR) 40 MG tablet, Take 1 tablet (40 mg total) by mouth at bedtime. (Patient not taking: Reported on 06/06/2016), Disp: 30 tablet, Rfl: 0 .  tiZANidine (ZANAFLEX) 4 MG tablet, TAKE 3 TABLETS(12 MG) BY MOUTH AT BEDTIME (Patient not taking: Reported on 06/06/2016), Disp: 90 tablet, Rfl: 3  Review of Systems  Constitutional: Positive for diaphoresis. Negative for appetite change, chills, fatigue, fever and unexpected weight change.  HENT: Positive for congestion. Negative for dental problem, drooling, ear pain, facial swelling, hearing loss, mouth sores, sneezing, sore throat, trouble swallowing and voice change.   Eyes: Positive for discharge. Negative for pain, redness, itching and visual disturbance.  Respiratory: Positive for cough, shortness of breath and wheezing. Negative for choking.   Cardiovascular: Positive for leg swelling. Negative for chest pain and palpitations.  Gastrointestinal: Positive for vomiting. Negative for abdominal pain, blood in stool, constipation  and diarrhea.  Endocrine: Negative for cold intolerance, heat intolerance and polydipsia.  Genitourinary: Negative for decreased urine volume, dysuria and hematuria.  Musculoskeletal: Positive for arthralgias, back pain and gait problem.  Skin: Negative for rash.  Allergic/Immunologic: Negative for environmental allergies.  Neurological: Positive for headaches. Negative for seizures, syncope and light-headedness.  Hematological: Negative for adenopathy.  Psychiatric/Behavioral: Positive for dysphoric mood. Negative for agitation and suicidal ideas. The patient is not nervous/anxious.     Per HPI unless specifically indicated above     Objective:    BP 140/84 (BP Location: Left Arm, Patient Position: Sitting, Cuff Size: Normal)   Pulse 60   Temp 97.2 F (36.2 C)  Ht 5\' 4"  (1.626 m)   Wt 201 lb 4 oz (91.3 kg)   SpO2 98%   BMI 34.54 kg/m   Wt Readings from Last 3 Encounters:  06/06/16 201 lb 4 oz (91.3 kg)  04/21/16 210 lb (95.3 kg)  12/26/15 218 lb (98.9 kg)    Physical Exam  Constitutional: She is oriented to person, place, and time. She appears well-developed and well-nourished.  HENT:  Head: Normocephalic and atraumatic.  Mouth/Throat: Oropharynx is clear and moist. No oropharyngeal exudate.  Eyes: Conjunctivae and EOM are normal. Pupils are equal, round, and reactive to light.  Neck: Neck supple. No thyromegaly present.  Cardiovascular: Normal rate and regular rhythm.   Pulmonary/Chest: Effort normal and breath sounds normal.  Abdominal: Soft. Bowel sounds are normal. She exhibits no mass. There is no hepatosplenomegaly. There is no tenderness.  Musculoskeletal: She exhibits no edema.  Lymphadenopathy:    She has no cervical adenopathy.  Neurological: She is alert and oriented to person, place, and time. Gait normal.  Skin: Skin is warm and dry.  Psychiatric: She has a normal mood and affect. Her behavior is normal.  Vitals reviewed.       Assessment & Plan:     Encounter Diagnoses  Name Primary?  . Type 2 diabetes mellitus with complication, unspecified long term insulin use status (HCC) Yes  . Essential hypertension   . Hyperlipidemia   . Chronic obstructive pulmonary disease, unspecified COPD type (HCC)   . Cardiac pacemaker in situ   . Depression   . Obesity, unspecified   . Cigarette nicotine dependence with nicotine-induced disorder   . Screening for breast cancer     -pt to [facility] for appointment or go to walk-in at [facility] for [facility] issues -pt to Call cardiology to reschedule her appointment -order Screening mammogram -Pt would like GI in [place].  She has been going to [facility] but they're in [place].  Will call [facility] to see if they would accept pt.  Discussed with her that I don't know.  Pt hasn't been seen at [facility] in years. -pt pulmonary f/u is prn per last note by dr wert -pt to call eye dr to schedule appt -pt to call pain clinic for her chronic pain. Discussed with her that [facility] doesn't manage chronic pain -F/u 4 weeks.  RTO sooner prn -rx Nystatin for thrush

## 2016-06-10 ENCOUNTER — Other Ambulatory Visit: Payer: Self-pay | Admitting: Family Medicine

## 2016-06-10 ENCOUNTER — Encounter: Payer: Self-pay | Admitting: Gastroenterology

## 2016-06-11 ENCOUNTER — Other Ambulatory Visit: Payer: Self-pay | Admitting: Physician Assistant

## 2016-06-18 ENCOUNTER — Other Ambulatory Visit: Payer: Self-pay | Admitting: Physician Assistant

## 2016-06-18 DIAGNOSIS — G8929 Other chronic pain: Secondary | ICD-10-CM

## 2016-06-18 MED ORDER — BD PEN NEEDLE NANO U/F 32G X 4 MM MISC: 1.0000 | Freq: Four times a day (QID) | 0 refills | Status: DC

## 2016-06-19 ENCOUNTER — Other Ambulatory Visit: Payer: Self-pay | Admitting: Physician Assistant

## 2016-06-19 DIAGNOSIS — Z1231 Encounter for screening mammogram for malignant neoplasm of breast: Secondary | ICD-10-CM

## 2016-06-24 ENCOUNTER — Ambulatory Visit (HOSPITAL_COMMUNITY)
Admission: RE | Admit: 2016-06-24 | Discharge: 2016-06-24 | Disposition: A | Discharge status: Home / Self Care | Payer: Medicaid Other | Source: Ambulatory Visit | Attending: Physician Assistant | Admitting: Physician Assistant

## 2016-06-24 ENCOUNTER — Other Ambulatory Visit: Payer: Self-pay | Admitting: Physician Assistant

## 2016-06-24 ENCOUNTER — Encounter (HOSPITAL_COMMUNITY): Payer: Self-pay

## 2016-06-24 ENCOUNTER — Ambulatory Visit (HOSPITAL_COMMUNITY): Admission: RE | Admit: 2016-06-24 | Payer: Medicaid Other | Source: Ambulatory Visit

## 2016-06-24 DIAGNOSIS — Z1231 Encounter for screening mammogram for malignant neoplasm of breast: Secondary | ICD-10-CM

## 2016-06-24 LAB — COMPREHENSIVE METABOLIC PANEL
ALBUMIN: 3.7 g/dL (ref 3.6–5.1)
ALK PHOS: 97 U/L (ref 33–130)
ALT: 20 U/L (ref 6–29)
AST: 22 U/L (ref 10–35)
BUN: 10 mg/dL (ref 7–25)
CALCIUM: 9 mg/dL (ref 8.6–10.4)
CHLORIDE: 105 mmol/L (ref 98–110)
CO2: 23 mmol/L (ref 20–31)
Creat: 0.86 mg/dL (ref 0.50–1.05)
Glucose, Bld: 412 mg/dL — ABNORMAL HIGH (ref 65–99)
POTASSIUM: 4.4 mmol/L (ref 3.5–5.3)
Sodium: 136 mmol/L (ref 135–146)
TOTAL PROTEIN: 5.6 g/dL — AB (ref 6.1–8.1)
Total Bilirubin: 0.3 mg/dL (ref 0.2–1.2)

## 2016-06-24 LAB — LIPID PANEL
CHOL/HDL RATIO: 6.9 ratio — AB (ref <=5.0)
CHOLESTEROL: 261 mg/dL — AB (ref 125–200)
HDL: 38 mg/dL — ABNORMAL LOW (ref >=46)
TRIGLYCERIDES: 425 mg/dL — AB (ref <150)

## 2016-06-25 LAB — HEMOGLOBIN A1C
Hgb A1c MFr Bld: 11.4 % — ABNORMAL HIGH (ref <5.7)
MEAN PLASMA GLUCOSE: 280 mg/dL

## 2016-06-26 ENCOUNTER — Other Ambulatory Visit: Payer: Self-pay | Admitting: Physician Assistant

## 2016-06-26 ENCOUNTER — Ambulatory Visit: Payer: Medicaid Other | Admitting: Physician Assistant

## 2016-06-26 ENCOUNTER — Encounter: Payer: Self-pay | Admitting: Physician Assistant

## 2016-06-26 VITALS — BP 146/78 | HR 82 | Temp 98.1°F | Ht 64.0 in | Wt 216.5 lb

## 2016-06-26 DIAGNOSIS — G8929 Other chronic pain: Secondary | ICD-10-CM

## 2016-06-26 DIAGNOSIS — E1142 Type 2 diabetes mellitus with diabetic polyneuropathy: Secondary | ICD-10-CM

## 2016-06-26 MED ORDER — GABAPENTIN 300 MG PO CAPS: 300.0000 mg | ORAL_CAPSULE | Freq: Three times a day (TID) | ORAL | 0 refills | Status: DC

## 2016-06-26 MED ORDER — TIZANIDINE HCL 4 MG PO TABS: 4.0000 mg | ORAL_TABLET | Freq: Three times a day (TID) | ORAL | 0 refills | Status: DC

## 2016-06-26 NOTE — Progress Notes (Signed)
BP (!) 146/78 (BP Location: Left Arm, Patient Position: Sitting, Cuff Size: Normal)   Pulse 82   Temp 98.1 F (36.7 C)   Ht 5\' 4"  (1.626 m)   Wt 216 lb 8 oz (98.2 kg)   SpO2 97%   BMI 37.16 kg/m    Subjective:    Patient ID: [name], female    DOB: March 10, 1966, 50 y.o.   MRN: [id number]  HPI: [name] is a 50 y.o. female presenting on 06/26/2016 for Leg Pain   HPI   Pt states this is her usual leg pain but since she hasn't been to the pain clinic yet she is out of her medication and is having pain.  States it is the gabapentin and tizanidine that she says she needs to get feeling better yet.    Pt has multiple referrals from original appointment- she has upcoming appointments with cardiology and GI.  Pain specialist appointment not scheduled yet.   Relevant past medical, surgical, family and social history reviewed and updated as indicated. Interim medical history since our last visit reviewed. Allergies and medications reviewed and updated.   Current Outpatient Prescriptions:  .  albuterol (PROVENTIL) (2.5 MG/3ML) 0.083% nebulizer solution, Take 3 mLs (2.5 mg total) by nebulization every 6 (six) hours as needed for wheezing or shortness of breath., Disp: 75 mL, Rfl: 12 .  aspirin EC 81 MG tablet, Take 81 mg by mouth daily., Disp: , Rfl:  .  budesonide-formoterol (SYMBICORT) 160-4.5 MCG/ACT inhaler, Inhale 2 puffs into the lungs every 12 (twelve) hours., Disp: 1 Inhaler, Rfl: 1 .  carvedilol (COREG) 12.5 MG tablet, Take 1 tablet (12.5 mg total) by mouth 2 (two) times daily with a meal., Disp: 60 tablet, Rfl: 0 .  diclofenac sodium (VOLTAREN) 1 % GEL, Apply 4 g topically 4 (four) times daily., Disp: 200 g, Rfl: 11 .  insulin aspart (NOVOLOG) 100 UNIT/ML injection, Inject 15 Units into the skin 3 (three) times daily before meals., Disp: 15 mL, Rfl: 0 .  LEVEMIR FLEXTOUCH 100 UNIT/ML Pen, Inject 80 Units into the skin daily at 10 pm., Disp: 15 mL, Rfl: 0 .  metFORMIN  (GLUCOPHAGE) 1000 MG tablet, Take 1 tablet (1,000 mg total) by mouth 2 (two) times daily with a meal., Disp: 60 tablet, Rfl: 1 .  nitroGLYCERIN (NITROSTAT) 0.4 MG SL tablet, Place 1 tablet (0.4 mg total) under the tongue every 5 (five) minutes as needed for chest pain., Disp: 30 tablet, Rfl: 0 .  pantoprazole (PROTONIX) 40 MG tablet, TAKE 1 TABLET BY MOUTH DAILY FOR STOMACH, Disp: 30 tablet, Rfl: 0 .  potassium chloride SA (K-DUR,KLOR-CON) 20 MEQ tablet, Take 1 tablet (20 mEq total) by mouth daily., Disp: 30 tablet, Rfl: 0 .  acetaminophen-codeine (TYLENOL #3) 300-30 MG tablet, One every 4 hours as needed for cough (Patient not taking: Reported on 06/26/2016), Disp: 40 tablet, Rfl: 0 .  BD PEN NEEDLE NANO U/F 32G X 4 MM MISC, 1 each by Does not apply route 4 (four) times daily., Disp: 120 each, Rfl: 0 .  clopidogrel (PLAVIX) 75 MG tablet, Take 1 tablet (75 mg total) by mouth daily. (Patient not taking: Reported on 06/26/2016), Disp: 30 tablet, Rfl: 0 .  furosemide (LASIX) 40 MG tablet, Take 1 tablet (40 mg total) by mouth every morning. (Patient not taking: Reported on 06/26/2016), Disp: 30 tablet, Rfl: 0 .  gabapentin (NEURONTIN) 300 MG capsule, Take 1 capsule (300 mg total) by mouth 3 (three) times daily. (  Patient not taking: Reported on 06/26/2016), Disp: 90 capsule, Rfl: 0 .  hydrOXYzine (ATARAX/VISTARIL) 25 MG tablet, Take 1-2 tablets (25-50 mg total) by mouth every 6 (six) hours as needed for anxiety. (Patient not taking: Reported on 06/26/2016), Disp: 20 tablet, Rfl: 0 .  ibuprofen (ADVIL,MOTRIN) 800 MG tablet, TAKE 1 TABLET(800 MG) BY MOUTH EVERY 8 HOURS AS NEEDED (Patient not taking: Reported on 06/26/2016), Disp: 60 tablet, Rfl: 0 .  OLANZapine (ZYPREXA) 5 MG tablet, Take 1 tablet (5 mg total) by mouth at bedtime. (Patient not taking: Reported on 06/26/2016), Disp: 30 tablet, Rfl: 1 .  OXYGEN, Inhale 3 L/day into the lungs at bedtime., Disp: , Rfl:  .  sertraline (ZOLOFT) 100 MG tablet, TAKE 1  TABLET(100 MG) BY MOUTH DAILY (Patient not taking: Reported on 06/26/2016), Disp: 30 tablet, Rfl: 0 .  simvastatin (ZOCOR) 40 MG tablet, Take 1 tablet (40 mg total) by mouth at bedtime. (Patient not taking: Reported on 06/26/2016), Disp: 30 tablet, Rfl: 0 .  tiZANidine (ZANAFLEX) 4 MG tablet, TAKE 3 TABLETS(12 MG) BY MOUTH AT BEDTIME (Patient not taking: Reported on 06/26/2016), Disp: 90 tablet, Rfl: 3   Review of Systems  Constitutional: Negative for appetite change, chills, diaphoresis, fatigue, fever and unexpected weight change.  HENT: Positive for congestion. Negative for dental problem, drooling, ear pain, facial swelling, hearing loss, mouth sores, sneezing, sore throat, trouble swallowing and voice change.   Eyes: Negative for pain, discharge, redness, itching and visual disturbance.  Respiratory: Positive for cough. Negative for choking, shortness of breath and wheezing.   Cardiovascular: Negative for chest pain, palpitations and leg swelling.  Gastrointestinal: Negative for abdominal pain, blood in stool, constipation, diarrhea and vomiting.  Endocrine: Negative for cold intolerance, heat intolerance and polydipsia.  Genitourinary: Negative for decreased urine volume, dysuria and hematuria.  Musculoskeletal: Positive for arthralgias and back pain. Negative for gait problem.  Skin: Negative for rash.  Allergic/Immunologic: Negative for environmental allergies.  Neurological: Positive for headaches. Negative for seizures, syncope and light-headedness.  Hematological: Negative for adenopathy.  Psychiatric/Behavioral: Negative for agitation, dysphoric mood and suicidal ideas. The patient is not nervous/anxious.        Per HPI unless specifically indicated above     Objective:    BP (!) 146/78 (BP Location: Left Arm, Patient Position: Sitting, Cuff Size: Normal)   Pulse 82   Temp 98.1 F (36.7 C)   Ht 5\' 4"  (1.626 m)   Wt 216 lb 8 oz (98.2 kg)   SpO2 97%   BMI 37.16 kg/m   Wt  Readings from Last 3 Encounters:  06/26/16 216 lb 8 oz (98.2 kg)  06/06/16 201 lb 4 oz (91.3 kg)  04/21/16 210 lb (95.3 kg)    Physical Exam  Constitutional: She is oriented to person, place, and time. She appears well-developed and well-nourished.  HENT:  Head: Normocephalic and atraumatic.  Neck: Neck supple.  Cardiovascular: Normal rate and regular rhythm.   Pulses:      Dorsalis pedis pulses are 3+ on the right side, and 3+ on the left side.  Pulmonary/Chest: Effort normal and breath sounds normal.  Abdominal: Soft. Bowel sounds are normal. She exhibits no mass. There is no hepatosplenomegaly. There is no tenderness.  Musculoskeletal: She exhibits no edema.       Right upper leg: She exhibits no tenderness, no swelling and no edema.       Left upper leg: She exhibits no tenderness, no swelling and no edema.  Right lower leg: She exhibits no tenderness, no swelling and no edema.       Left lower leg: She exhibits no tenderness, no swelling and no edema.  Lymphadenopathy:    She has no cervical adenopathy.  Neurological: She is alert and oriented to person, place, and time.  Skin: Skin is warm and dry.  Psychiatric: She has a normal mood and affect. Her behavior is normal.  Vitals reviewed.       Assessment & Plan:   Encounter Diagnoses  Name Primary?  . Other chronic pain Yes  . Diabetic polyneuropathy associated with type 2 diabetes mellitus (HCC)     Discussed with pt will give 2 week supply only with no refills of the tizanadine and gabapentin and then hopefully she will be in with pain specialist.    Pt has routine follow-up next week.

## 2016-06-27 ENCOUNTER — Other Ambulatory Visit: Payer: Self-pay | Admitting: Physician Assistant

## 2016-06-30 ENCOUNTER — Emergency Department (HOSPITAL_COMMUNITY)
Admission: EM | Admit: 2016-06-30 | Discharge: 2016-07-01 | Disposition: A | Discharge status: Home / Self Care | Payer: Medicaid Other | Attending: Emergency Medicine | Admitting: Emergency Medicine

## 2016-06-30 ENCOUNTER — Encounter (HOSPITAL_COMMUNITY): Payer: Self-pay

## 2016-06-30 DIAGNOSIS — J449 Chronic obstructive pulmonary disease, unspecified: Secondary | ICD-10-CM | POA: Insufficient documentation

## 2016-06-30 DIAGNOSIS — Z7984 Long term (current) use of oral hypoglycemic drugs: Secondary | ICD-10-CM | POA: Insufficient documentation

## 2016-06-30 DIAGNOSIS — J45909 Unspecified asthma, uncomplicated: Secondary | ICD-10-CM | POA: Insufficient documentation

## 2016-06-30 DIAGNOSIS — Z7982 Long term (current) use of aspirin: Secondary | ICD-10-CM | POA: Insufficient documentation

## 2016-06-30 DIAGNOSIS — Z79899 Other long term (current) drug therapy: Secondary | ICD-10-CM | POA: Diagnosis not present

## 2016-06-30 DIAGNOSIS — M7989 Other specified soft tissue disorders: Secondary | ICD-10-CM | POA: Diagnosis not present

## 2016-06-30 DIAGNOSIS — Z794 Long term (current) use of insulin: Secondary | ICD-10-CM | POA: Insufficient documentation

## 2016-06-30 DIAGNOSIS — E119 Type 2 diabetes mellitus without complications: Secondary | ICD-10-CM | POA: Insufficient documentation

## 2016-06-30 DIAGNOSIS — F17210 Nicotine dependence, cigarettes, uncomplicated: Secondary | ICD-10-CM | POA: Diagnosis not present

## 2016-06-30 DIAGNOSIS — I10 Essential (primary) hypertension: Secondary | ICD-10-CM | POA: Insufficient documentation

## 2016-06-30 NOTE — ED Triage Notes (Signed)
Both of my feet are swelling, my left is worse than my right.  They both hurt when I am walking per pt.  I started going to the free clinic and they took me off of some of my medications including my lasix.

## 2016-07-01 LAB — COMPREHENSIVE METABOLIC PANEL
ALT: 21 U/L (ref 14–54)
ANION GAP: 7 (ref 5–15)
AST: 24 U/L (ref 15–41)
Albumin: 3.6 g/dL (ref 3.5–5.0)
Alkaline Phosphatase: 91 U/L (ref 38–126)
BUN: 14 mg/dL (ref 6–20)
CHLORIDE: 105 mmol/L (ref 101–111)
CO2: 23 mmol/L (ref 22–32)
Calcium: 9.2 mg/dL (ref 8.9–10.3)
Creatinine, Ser: 0.83 mg/dL (ref 0.44–1.00)
Glucose, Bld: 375 mg/dL — ABNORMAL HIGH (ref 65–99)
Potassium: 4 mmol/L (ref 3.5–5.1)
Sodium: 135 mmol/L (ref 135–145)
Total Bilirubin: 0.5 mg/dL (ref 0.3–1.2)
Total Protein: 6.5 g/dL (ref 6.5–8.1)

## 2016-07-01 LAB — CBC WITH DIFFERENTIAL/PLATELET
Basophils Absolute: 0 10*3/uL (ref 0.0–0.1)
Basophils Relative: 0 %
EOS ABS: 0.5 10*3/uL (ref 0.0–0.7)
Eosinophils Relative: 6 %
HEMATOCRIT: 39.2 % (ref 36.0–46.0)
HEMOGLOBIN: 12.9 g/dL (ref 12.0–15.0)
LYMPHS ABS: 2.9 10*3/uL (ref 0.7–4.0)
LYMPHS PCT: 36 %
MCH: 31 pg (ref 26.0–34.0)
MCHC: 32.9 g/dL (ref 30.0–36.0)
MCV: 94.2 fL (ref 78.0–100.0)
Monocytes Absolute: 0.5 10*3/uL (ref 0.1–1.0)
Monocytes Relative: 6 %
NEUTROS ABS: 4.3 10*3/uL (ref 1.7–7.7)
NEUTROS PCT: 52 %
Platelets: 174 10*3/uL (ref 150–400)
RBC: 4.16 MIL/uL (ref 3.87–5.11)
RDW: 13.2 % (ref 11.5–15.5)
WBC: 8.2 10*3/uL (ref 4.0–10.5)

## 2016-07-01 LAB — BRAIN NATRIURETIC PEPTIDE: B NATRIURETIC PEPTIDE 5: 30 pg/mL (ref 0.0–100.0)

## 2016-07-01 MED ORDER — FUROSEMIDE 40 MG PO TABS: 40.0000 mg | ORAL_TABLET | Freq: Every morning | ORAL | 0 refills | Status: DC

## 2016-07-01 MED ORDER — ACETAMINOPHEN 325 MG PO TABS
650.0000 mg | ORAL_TABLET | Freq: Once | ORAL | Status: DC
  Filled 2016-07-01: qty 2

## 2016-07-01 MED ORDER — FUROSEMIDE 40 MG PO TABS
40.0000 mg | ORAL_TABLET | Freq: Once | ORAL | Status: AC
  Administered 2016-07-01: 40 mg via ORAL
  Filled 2016-07-01: qty 1

## 2016-07-01 NOTE — Discharge Instructions (Signed)
Read the information below.  Your sugars were elevated, your labs are otherwise re-assuring. You were given a dose of lasix in the ED.  I have prescribed your home dose of lasix. Since you received your dose in the ED, start on Tuesday.  Be sure to keep your appointment with your cardiologist on Thursday for re-evaluation.  Follow up with your primary provider as needed.  Use the prescribed medication as directed.  Please discuss all new medications with your pharmacist.   You may return to the Emergency Department at any time for worsening condition or any new symptoms that concern you. Return to ED if you develop fever, chest pain, shortness of breath, worsening swelling, or any other new/concerning symptoms.

## 2016-07-01 NOTE — ED Notes (Signed)
Pt walking out in hall stating she was ready to go. Explained to pt that I was working on her discharge paperwork at the time and asked pt to return to her room and I would bring her paperwork and medication that was ordered at discharge.

## 2016-07-01 NOTE — ED Notes (Signed)
In to discharge pt. Pt angry. Stated that she wasn't going to take the Tylenol as ordered since it was "just plain Tylenol" but agreed to take her Lasix. Pt also requesting this nurses' name "because you were hateful when asking me to go back to my room". Apologized to pt that she felt this way but gave pt my name anyway. Pt refused for me to go over her discharge instructions stating that she "could read it herself". Pt asked her husband to get her a wheelchair and refused to sign discharge in computer stating that she "knew her rights".

## 2016-07-01 NOTE — ED Provider Notes (Signed)
[facility]-EMER[facility]ENCY DEPT Provider Note   CSN: [id number] Arrival date & time: 06/30/16  2220     History   Chief Complaint Chief Complaint  Patient presents with  . Foot Swelling    HPI [name] is a 50 y.o. female.  [name] is a 50 y.o. female with h/o T2DM, HTN, HLD, cardiac pacemaker, COPD, diabetic neuropathy, obesity, tobacco use, IBS, arthritis, depression presents to ED with complaint of b/l lower extremity swelling. Per patient, recently changed PCP and some her medications were d/c'd; in particular her lasix. She now reports to ED with complaint of b/l lower extremity swelling x 1 week with associated discomfort walking secondary to swelling. Patient does state she has chronic left hip and knee pain. Elevating her legs does not improve symptoms. Patient denies any history of heart failure. She denies fever, shortness of breath, chest pain, abdominal pain, N/V, dysuria, hematuria, numbness, weakness, or color change.  She denies recent long distance travel/surgery/hospitalization, hemoptysis, h/o blood clot, h/o cancer, or OCP use. Patient reports she is scheduled to see her cardiologist on Thursday.       Past Medical History:  Diagnosis Date  . Anxiety   . Arthritis   . Cardiac pacemaker in situ 2009   DDD [employer] -- [place]TRUNA 60  . COPD with asthma (HCC)    GOLD 2-3 --  pulmologist (last visit 2011) Dr. Marchelle Gearing  . Crohn's disease (HCC)    Large intestine  . Depression 2016   PTSD  . Emphysema lung (HCC)   . Essential hypertension   . Gastroparesis   . GERD (gastroesophageal reflux disease)   . History of hiatal hernia   . History of kidney stones   . History of stroke    Jun 2011 -- right hand weakness  . History of syncope   . Inappropriate sinus tachycardia    Sinus node modification 02-25-2003 by Dr. Lewayne Bunting  . LLQ abdominal tenderness 10/20/2015  . OSA (obstructive sleep apnea)    Study done 2005 -- pt refused CPAP/previously was  using nocturnal oxygen until one year ago pt states PCP is monitoring pt without  . Pelvic pain in female   . PTSD (post-traumatic stress disorder)   . Sinus node dysfunction (HCC)    Symptomatic bradycardia  . Stroke (HCC)   . Symptomatic sinus bradycardia   . Type 2 diabetes mellitus (HCC)   . Wears glasses     Patient Active Problem List   Diagnosis Date Noted  . Obesity, unspecified 06/06/2016  . Acute bronchitis 12/27/2015  . Upper airway cough syndrome 10/24/2015  . Morbid obesity (HCC) 10/24/2015  . Essential hypertension 10/24/2015  . LLQ abdominal tenderness 10/20/2015  . Diabetic neuropathy (HCC) 08/02/2015  . Depression 07/04/2015  . COPD GOLD 0 / still smoking  07/04/2015  . OSA (obstructive sleep apnea) 07/04/2015  . Other specified cardiac dysrhythmias(427.89)   . Diabetes mellitus type 2, uncontrolled (HCC) 08/22/2010  . MIXED HYPERLIPIDEMIA 11/20/2009  . Cigarette smoker 11/03/2009  . IBS 10/23/2009  . CARDIAC PACEMAKER IN SITU 08/29/2009  . ESOPHAGEAL STRICTURE 06/28/2009  . HYPERTENSION, BENIGN ESSENTIAL 05/11/2009  . ESOPHAGEAL REFLUX 03/16/2009  . GASTROPARESIS 03/16/2009    Past Surgical History:  Procedure Laterality Date  . ABDOMINAL HYSTERECTOMY    . BILATERAL KNEE ARTHROSCOPY W/ CHONDROMALACIA PATELLA  bilateral ---- 12-26-2010; 08-14-2009;  04-25-2008;  03-09-2007   additional same surgery, Left knee 2005;  x2 2006 ---  Right knee 2005;  2006;  x2  2007  . CARDIAC CATHETERIZATION  05-08-2001  dr Nicki Guadalajara   normal coronary arteries and LVF  . CARDIAC ELECTROPHYSIOLOGY STUDY W/  SINUS NODE MODIFICATION  02-25-2003  dr Sharlot Gowda taylor  . CARDIAC PACEMAKER PLACEMENT  10-16-2007  dr Lewayne Bunting   DDD-- [facility] 60  . CARDIOVASCULAR STRESS TEST  08-02-2010  dr Diona Browner   normal lexiscan study/  ef 59%  . CESAREAN SECTION  x2  . CHOLECYSTECTOMY  1994  . CYSTO/  URETEROSCOPIC STONE EXTRACTION  03/ 2005  . CYSTOSCOPY WITH  HYDRODISTENSION AND BIOPSY N/A 05/23/2015   Procedure: CYSTOSCOPY/BIOPSY/HYDRODISTENSION;  Surgeon: Su Grand, MD;  Location: [facility];  Service: Urology;  Laterality: N/A;  . TONSILLECTOMY  11-25-2002    OB History    Gravida Para Term Preterm AB Living   5 2     3 2    SAB TAB Ectopic Multiple Live Births   3               Home Medications    Prior to Admission medications   Medication Sig Start Date End Date Taking? Authorizing Provider  acetaminophen-codeine (TYLENOL #3) 300-30 MG tablet One every 4 hours as needed for cough Patient not taking: Reported on 06/26/2016 12/26/15   Nyoka Cowden, MD  albuterol (PROVENTIL) (2.5 MG/3ML) 0.083% nebulizer solution Take 3 mLs (2.5 mg total) by nebulization every 6 (six) hours as needed for wheezing or shortness of breath. 11/09/15   Elige Radon Dettinger, MD  aspirin EC 81 MG tablet Take 81 mg by mouth daily.    Historical Provider, MD  BD PEN NEEDLE NANO U/F 32G X 4 MM MISC 1 each by Does not apply route 4 (four) times daily. 06/18/16   Jacquelin Hawking, PA-C  budesonide-formoterol (SYMBICORT) 160-4.5 MCG/ACT inhaler Inhale 2 puffs into the lungs every 12 (twelve) hours. 06/06/16   Jacquelin Hawking, PA-C  carvedilol (COREG) 12.5 MG tablet Take 1 tablet (12.5 mg total) by mouth 2 (two) times daily with a meal. 06/06/16   Jacquelin Hawking, PA-C  clopidogrel (PLAVIX) 75 MG tablet Take 1 tablet (75 mg total) by mouth daily. Patient not taking: Reported on 06/26/2016 06/06/16   Jacquelin Hawking, PA-C  diclofenac sodium (VOLTAREN) 1 % GEL Apply 4 g topically 4 (four) times daily. 11/09/15   Elige Radon Dettinger, MD  furosemide (LASIX) 40 MG tablet Take 1 tablet (40 mg total) by mouth every morning. 07/01/16   Lona Kettle, PA-C  gabapentin (NEURONTIN) 300 MG capsule Take 1 capsule (300 mg total) by mouth 3 (three) times daily. 06/26/16   Jacquelin Hawking, PA-C  hydrOXYzine (ATARAX/VISTARIL) 25 MG tablet Take 1-2 tablets (25-50 mg total) by  mouth every 6 (six) hours as needed for anxiety. Patient not taking: Reported on 06/26/2016 12/05/15   Kristen N Ward, DO  ibuprofen (ADVIL,MOTRIN) 800 MG tablet TAKE 1 TABLET(800 MG) BY MOUTH EVERY 8 HOURS AS NEEDED Patient not taking: Reported on 06/26/2016 04/15/16   Elige Radon Dettinger, MD  insulin aspart (NOVOLOG) 100 UNIT/ML injection Inject 15 Units into the skin 3 (three) times daily before meals. 06/06/16   Jacquelin Hawking, PA-C  LEVEMIR FLEXTOUCH 100 UNIT/ML Pen Inject 80 Units into the skin daily at 10 pm. 06/06/16   Jacquelin Hawking, PA-C  metFORMIN (GLUCOPHAGE) 1000 MG tablet Take 1 tablet (1,000 mg total) by mouth 2 (two) times daily with a meal. 06/06/16   Jacquelin Hawking, PA-C  nitroGLYCERIN (NITROSTAT) 0.4 MG SL tablet Place  1 tablet (0.4 mg total) under the tongue every 5 (five) minutes as needed for chest pain. 04/22/16   Devoria AlbeIva Knapp, MD  OLANZapine (ZYPREXA) 5 MG tablet Take 1 tablet (5 mg total) by mouth at bedtime. Patient not taking: Reported on 06/26/2016 11/09/15   Elige RadonJoshua A Dettinger, MD  OXYGEN Inhale 3 L/day into the lungs at bedtime.    Historical Provider, MD  pantoprazole (PROTONIX) 40 MG tablet TAKE 1 TABLET BY MOUTH DAILY FOR STOMACH 04/22/16   Devoria AlbeIva Knapp, MD  potassium chloride SA (K-DUR,KLOR-CON) 20 MEQ tablet Take 1 tablet (20 mEq total) by mouth daily. 04/22/16   Devoria AlbeIva Knapp, MD  sertraline (ZOLOFT) 100 MG tablet TAKE 1 TABLET(100 MG) BY MOUTH DAILY Patient not taking: Reported on 06/26/2016 04/22/16   Devoria AlbeIva Knapp, MD  simvastatin (ZOCOR) 40 MG tablet Take 1 tablet (40 mg total) by mouth at bedtime. Patient not taking: Reported on 06/26/2016 04/22/16   Devoria AlbeIva Knapp, MD  tiZANidine (ZANAFLEX) 4 MG tablet Take 1 tablet (4 mg total) by mouth 3 (three) times daily. 06/26/16   Jacquelin HawkingShannon McElroy, PA-C    Family History Family History  Problem Relation Age of Onset  . Diabetes Mother   . Asthma Mother   . Heart disease Mother   . Cirrhosis Mother   . Stroke Mother   . Heart disease Father       Deceased. MI. Mother, 2 brothers, sister, nephew also have heart disease  . Emphysema Father     Died of it. Was pt of Dr. Sherene SiresWert   . Heart attack Father   . Cancer Father     ? type  . Diabetes Brother   . Heart disease Brother   . Diabetes Sister   . Cirrhosis Sister   . Cirrhosis Maternal Grandmother   . Diabetes Brother   . Heart attack Brother   . Heart disease Brother   . Seizures Brother     Social History Social History  Substance Use Topics  . Smoking status: Current Every Day Smoker    Packs/day: 1.50    Years: 42.00    Types: Cigarettes    Start date: 02/02/1973  . Smokeless tobacco: Never Used     Comment: 1 - 2 ppd depends on stress  . Alcohol use No     Allergies   Amoxicillin and Flexeril [cyclobenzaprine]   Review of Systems Review of Systems  Constitutional: Negative for fever.  HENT: Negative for trouble swallowing.   Eyes: Negative for visual disturbance.  Respiratory: Negative for shortness of breath.   Cardiovascular: Positive for leg swelling ( b/l). Negative for chest pain.  Gastrointestinal: Negative for abdominal pain, diarrhea, nausea and vomiting.  Genitourinary: Negative for dysuria and hematuria.  Musculoskeletal: Positive for arthralgias. Negative for myalgias.  Skin: Negative for color change and rash.  Neurological: Negative for syncope, weakness, numbness and headaches.     Physical Exam Updated Vital Signs BP 159/90   Pulse 62   Temp 98.3 F (36.8 C) (Oral)   Resp 18   Ht 5\' 4"  (1.626 m)   Wt 98 kg   SpO2 100%   BMI 37.08 kg/m   Physical Exam  Constitutional: She appears well-developed and well-nourished. No distress.  HENT:  Head: Normocephalic and atraumatic.  Mouth/Throat: Oropharynx is clear and moist. No oropharyngeal exudate.  Eyes: Conjunctivae and EOM are normal. Pupils are equal, round, and reactive to light. Right eye exhibits no discharge. Left eye exhibits no discharge. No scleral icterus.  Neck:  Normal range of motion and phonation normal. Neck supple. No neck rigidity. Normal range of motion present.  Cardiovascular: Normal rate, regular rhythm, normal heart sounds and intact distal pulses.   No murmur heard. Pulmonary/Chest: Effort normal and breath sounds normal. No stridor. No respiratory distress. She has no wheezes. She has no rales.  Respirations unlabored. No wheezes, rales, or crackles on exam. No hypoxia.   Abdominal: Soft. Bowel sounds are normal. She exhibits no distension. There is no tenderness. There is no rigidity, no rebound, no guarding and no CVA tenderness.  Musculoskeletal: Normal range of motion. She exhibits edema.  B/l 2+ lower extremity edema. No TTP of posterior calf. No palpable cords. Negative Homan's sign.   Lymphadenopathy:    She has no cervical adenopathy.  Neurological: She is alert. She is not disoriented. Coordination and gait normal. GCS eye subscore is 4. GCS verbal subscore is 5. GCS motor subscore is 6.  Skin: Skin is warm and dry. She is not diaphoretic.  Psychiatric: She has a normal mood and affect. Her behavior is normal.     ED Treatments / Results  Labs (all labs ordered are listed, but only abnormal results are displayed) Labs Reviewed  COMPREHENSIVE METABOLIC PANEL - Abnormal; Notable for the following:       Result Value   Glucose, Bld 375 (*)    All other components within normal limits  CBC WITH DIFFERENTIAL/PLATELET  BRAIN NATRIURETIC PEPTIDE    EKG  EKG Interpretation  Date/Time:  Monday July 01 2016 00:59:05 EDT Ventricular Rate:  63 PR Interval:    QRS Duration: 95 QT Interval:  437 QTC Calculation: 448 R Axis:   54 Text Interpretation:  Atrial-paced complexes Low voltage, precordial leads Probable anteroseptal infarct, old No significant change was found Confirmed by Manus Gunning  MD, STEPHEN [phone]) on 07/01/2016 1:07:08 AM       Radiology No results found.  Procedures Procedures (including critical care  time)  Medications Ordered in ED Medications  furosemide (LASIX) tablet 40 mg (40 mg Oral Given 07/01/16 0246)     Initial Impression / Assessment and Plan / ED Course  I have reviewed the triage vital signs and the nursing notes.  Pertinent labs & imaging results that were available during my care of the patient were reviewed by me and considered in my medical decision making (see chart for details).  Clinical Course    Patient presents to ED with complaint of b/l lower extremity swelling. Patient is afebrile and non-toxic appearing in NAD. Vital signs remarkable for mildly elevated BP, otherwise stable. B/l 2+ lower extremity edema noted. No TTP of posterior calf, no palpable cords, negative homan's. Lungs are clear to auscultation, no wheezes or rales heard. No hypoxia. Heart exam RRR, no murmurs heard. Based on history and physical exam, low suspicion for DVT. EKG shows atrial paced rhythm with no significant changes from previous. BNP nml - low suspicion for CHF as etiology. CBC nml. Creatinine and LFTs re-assuring, doubt hepatic or renal etiology. Hyperglycemia present without AG. Suspect lower extremity edema may be secondary to not taking lasix. Will restart lasix. Patient scheduled to follow up with PCP and cardiology this week, encouraged pt to keep appointment. Return precautions given. Pt voiced understanding and is agreeable.     Final Clinical Impressions(s) / ED Diagnoses   Final diagnoses:  Swelling of lower extremity    New Prescriptions Discharge Medication List as of 07/01/2016  2:38 AM  Lona Kettle, PA-C 07/02/16 1434    Gilda Crease, MD 07/09/16 0010

## 2016-07-03 ENCOUNTER — Other Ambulatory Visit: Payer: Self-pay | Admitting: Physician Assistant

## 2016-07-03 ENCOUNTER — Ambulatory Visit: Payer: Medicaid Other | Admitting: Physician Assistant

## 2016-07-03 ENCOUNTER — Encounter: Payer: Self-pay | Admitting: Physician Assistant

## 2016-07-03 VITALS — BP 126/70 | HR 79 | Temp 97.2°F | Ht 64.0 in | Wt 211.8 lb

## 2016-07-03 DIAGNOSIS — E669 Obesity, unspecified: Secondary | ICD-10-CM

## 2016-07-03 DIAGNOSIS — Z950 Presence of cardiac pacemaker: Secondary | ICD-10-CM

## 2016-07-03 DIAGNOSIS — I10 Essential (primary) hypertension: Secondary | ICD-10-CM

## 2016-07-03 DIAGNOSIS — Z794 Long term (current) use of insulin: Secondary | ICD-10-CM

## 2016-07-03 DIAGNOSIS — K589 Irritable bowel syndrome without diarrhea: Secondary | ICD-10-CM

## 2016-07-03 DIAGNOSIS — E66812 Obesity, class 2: Secondary | ICD-10-CM

## 2016-07-03 DIAGNOSIS — IMO0002 Reserved for concepts with insufficient information to code with codable children: Secondary | ICD-10-CM

## 2016-07-03 DIAGNOSIS — G8929 Other chronic pain: Secondary | ICD-10-CM

## 2016-07-03 DIAGNOSIS — Z6836 Body mass index (BMI) 36.0-36.9, adult: Secondary | ICD-10-CM

## 2016-07-03 DIAGNOSIS — F39 Unspecified mood [affective] disorder: Secondary | ICD-10-CM

## 2016-07-03 DIAGNOSIS — E1165 Type 2 diabetes mellitus with hyperglycemia: Secondary | ICD-10-CM

## 2016-07-03 DIAGNOSIS — J449 Chronic obstructive pulmonary disease, unspecified: Secondary | ICD-10-CM

## 2016-07-03 DIAGNOSIS — E1142 Type 2 diabetes mellitus with diabetic polyneuropathy: Secondary | ICD-10-CM

## 2016-07-03 DIAGNOSIS — F17219 Nicotine dependence, cigarettes, with unspecified nicotine-induced disorders: Secondary | ICD-10-CM

## 2016-07-03 LAB — BASIC METABOLIC PANEL
BUN: 16 mg/dL (ref 7–25)
CALCIUM: 9.7 mg/dL (ref 8.6–10.4)
CO2: 21 mmol/L (ref 20–31)
CREATININE: 0.91 mg/dL (ref 0.50–1.05)
Chloride: 97 mmol/L — ABNORMAL LOW (ref 98–110)
Glucose, Bld: 394 mg/dL — ABNORMAL HIGH (ref 65–99)
Potassium: 4.6 mmol/L (ref 3.5–5.3)
SODIUM: 135 mmol/L (ref 135–146)

## 2016-07-03 MED ORDER — ATORVASTATIN CALCIUM 20 MG PO TABS: 20.0000 mg | ORAL_TABLET | Freq: Every day | ORAL | 6 refills | Status: DC

## 2016-07-03 NOTE — Patient Instructions (Signed)
Blood drawn today Start atorvastatin and fish oil 2 daily for cholesterol Go to diabetes education class

## 2016-07-03 NOTE — Progress Notes (Signed)
Cardiology Office Note  Date: 07/04/2016   ID: [name], DOB 1966/01/11, MRN [id number]  PCP: Meagan Hawking, PA-C  Primary Cardiologist: Nona Dell, MD   Chief Complaint  Patient presents with  . Hypertension  . Sinus node dysfunction    History of Present Illness: [name] is a 50 y.o. female last seen in November 2016. She has not maintained regular follow-up since that time. I see that she has also changed primary care providers as well, I  reviewed the recent notes from [name] PA-C. She comes in today without specific complaints of palpitations, has not had a recent pacemaker check but is due to see Dr. [name] in early November. She has a history of inappropriate sinus tachycardia status post sinus node modification with subsequent [employer] pacemaker placement by Dr. [name].  Patient seen in the ER recently with bilateral lower extremity swelling. She has a history of medication noncompliance, was not on Lasix recently, this was resumed. She is currently not on a potassium supplement, potassium drawn just yesterday was 4.6.  We went over her medications which are outlined below. She is on aspirin and Plavix with a previous history of stroke. She also remains on Coreg with history of palpitations and previous inappropriate sinus tachycardia. She was just placed back on Lasix recently and has had improved leg edema.  I also reviewed her recent lab work. Blood glucose has been very poorly controlled, hemoglobin A1c 11.4. Patient tells me that she has been referred to an endocrinologist by PCP. Total cholesterol 261 with elevated triglycerides in the 400s. Omega-3 supplements were recommended recently, I agree with this. Patient states that she is very worried about taking Lipitor due to reported problems with same medication in her sister. She does state that she was able to tolerate simvastatin in the past and we agreed to start 40 mg daily for  now.  Past Medical History:  Diagnosis Date  . Anxiety   . Arthritis   . Cardiac pacemaker in situ 2009   DDD [employer] -- [place] 60  . COPD with asthma (HCC)    GOLD 2-3 --  pulmologist (last visit 2011) Dr. Marchelle Gearing  . Crohn's disease (HCC)    Large intestine  . Depression 2016   PTSD  . Emphysema lung (HCC)   . Essential hypertension   . Gastroparesis   . GERD (gastroesophageal reflux disease)   . History of hiatal hernia   . History of kidney stones   . History of stroke    Jun 2011 -- right hand weakness  . History of syncope   . Inappropriate sinus tachycardia    Sinus node modification 02-25-2003 by Dr. Lewayne Bunting  . LLQ abdominal tenderness 10/20/2015  . OSA (obstructive sleep apnea)    Study done 2005 -- pt refused CPAP/previously was using nocturnal oxygen until one year ago pt states PCP is monitoring pt without  . Pelvic pain in female   . PTSD (post-traumatic stress disorder)   . Sinus node dysfunction (HCC)    Symptomatic bradycardia  . Stroke (HCC)   . Symptomatic sinus bradycardia   . Type 2 diabetes mellitus (HCC)   . Wears glasses     Past Surgical History:  Procedure Laterality Date  . ABDOMINAL HYSTERECTOMY    . BILATERAL KNEE ARTHROSCOPY W/ CHONDROMALACIA PATELLA  bilateral ---- 12-26-2010; 08-14-2009;  04-25-2008;  03-09-2007   additional same surgery, Left knee 2005;  x2 2006 ---  Right knee  2005;  2006;  x2  2007  . CARDIAC CATHETERIZATION  05-08-2001  dr Nicki Guadalajara   normal coronary arteries and LVF  . CARDIAC ELECTROPHYSIOLOGY STUDY W/  SINUS NODE MODIFICATION  02-25-2003  dr Sharlot Gowda taylor  . CARDIAC PACEMAKER PLACEMENT  10-16-2007  dr Lewayne Bunting   DDD-- [facility] 60  . CARDIOVASCULAR STRESS TEST  08-02-2010  dr Diona Browner   normal lexiscan study/  ef 59%  . CESAREAN SECTION  x2  . CHOLECYSTECTOMY  1994  . CYSTO/  URETEROSCOPIC STONE EXTRACTION  03/ 2005  . CYSTOSCOPY WITH HYDRODISTENSION AND BIOPSY N/A  05/23/2015   Procedure: CYSTOSCOPY/BIOPSY/HYDRODISTENSION;  Surgeon: Su Grand, MD;  Location[facility];  Service: Urology;  Laterality: N/A;  . TONSILLECTOMY  11-25-2002    Current Outpatient Prescriptions  Medication Sig Dispense Refill  . albuterol (PROVENTIL) (2.5 MG/3ML) 0.083% nebulizer solution Take 3 mLs (2.5 mg total) by nebulization every 6 (six) hours as needed for wheezing or shortness of breath. 75 mL 12  . aspirin EC 81 MG tablet Take 81 mg by mouth daily.    . BD PEN NEEDLE NANO U/F 32G X 4 MM MISC 1 each by Does not apply route 4 (four) times daily. 120 each 0  . budesonide-formoterol (SYMBICORT) 160-4.5 MCG/ACT inhaler Inhale 2 puffs into the lungs every 12 (twelve) hours. 1 Inhaler 1  . carvedilol (COREG) 12.5 MG tablet Take 1 tablet (12.5 mg total) by mouth 2 (two) times daily with a meal. 60 tablet 0  . clopidogrel (PLAVIX) 75 MG tablet TAKE 1 TABLET BY MOUTH EVERY DAY 30 tablet 1  . diclofenac sodium (VOLTAREN) 1 % GEL Apply 4 g topically 4 (four) times daily. 200 g 11  . furosemide (LASIX) 40 MG tablet Take 1 tablet (40 mg total) by mouth every morning. 30 tablet 0  . gabapentin (NEURONTIN) 300 MG capsule Take 1 capsule (300 mg total) by mouth 3 (three) times daily. 42 capsule 0  . ibuprofen (ADVIL,MOTRIN) 800 MG tablet TAKE 1 TABLET(800 MG) BY MOUTH EVERY 8 HOURS AS NEEDED 60 tablet 0  . insulin aspart (NOVOLOG) 100 UNIT/ML injection Inject 15 Units into the skin 3 (three) times daily before meals. 15 mL 0  . LEVEMIR FLEXTOUCH 100 UNIT/ML Pen Inject 80 Units into the skin daily at 10 pm. 15 mL 0  . metFORMIN (GLUCOPHAGE) 1000 MG tablet Take 1 tablet (1,000 mg total) by mouth 2 (two) times daily with a meal. 60 tablet 1  . nitroGLYCERIN (NITROSTAT) 0.4 MG SL tablet Place 1 tablet (0.4 mg total) under the tongue every 5 (five) minutes as needed for chest pain. 30 tablet 0  . OXYGEN Inhale 3 L/day into the lungs at bedtime.    Marland Kitchen tiZANidine (ZANAFLEX) 4 MG  tablet Take 1 tablet (4 mg total) by mouth 3 (three) times daily. 42 tablet 0  . simvastatin (ZOCOR) 40 MG tablet Take 1 tablet (40 mg total) by mouth every evening. 30 tablet 6   No current facility-administered medications for this visit.    Allergies:  Amoxicillin and Flexeril [cyclobenzaprine]   Social History: The patient  reports that she has been smoking Cigarettes.  She started smoking about 43 years ago. She has a 63.00 pack-year smoking history. She has never used smokeless tobacco. She reports that she does not drink alcohol or use drugs.   ROS:  Please see the history of present illness. Otherwise, complete review of systems is positive for anxiety.  All  other systems are reviewed and negative.   Physical Exam: VS:  BP 110/60   Pulse 65   Ht 5\' 4"  (1.626 m)   Wt 213 lb (96.6 kg)   SpO2 95%   BMI 36.56 kg/m , BMI Body mass index is 36.56 kg/m.  Wt Readings from Last 3 Encounters:  07/04/16 213 lb (96.6 kg)  07/03/16 211 lb 12 oz (96 kg)  06/30/16 216 lb (98 kg)    General: Overweight woman, appears comfortable at rest. HEENT: Conjunctiva and lids normal, oropharynx clear with poor dentition. Neck: Supple, no elevated JVP or carotid bruits, no thyromegaly. Lungs: Clear to auscultation, nonlabored breathing at rest. Cardiac: Regular rate and rhythm, no S3 or significant systolic murmur, no pericardial rub. Abdomen: Soft, nontender, bowel sounds present. Extremities: No pitting edema, distal pulses 2+. Skin: Warm and dry. Scattered tattoos noted. Musculoskeletal: No kyphosis. Neuropsychiatric: Alert and oriented x3, affect grossly appropriate.  ECG: I personally reviewed the tracing from 07/01/2016 which showed an atrial paced rhythm with decreased R wave progression.  Recent Labwork: 11/09/2015: TSH 0.585 07/01/2016: ALT 21; AST 24; B Natriuretic Peptide 30.0; Hemoglobin 12.9; Platelets 174 07/03/2016: BUN 16; Creat 0.91; Potassium 4.6; Sodium 135     Component Value  Date/Time   CHOL 261 (H) 06/24/2016 1416   CHOL 276 (H) 07/07/2015 1052   TRIG 425 (H) 06/24/2016 1416   TRIG 422 (H) 01/30/2015 1329   HDL 38 (L) 06/24/2016 1416   HDL 42 07/07/2015 1052   HDL 46 01/30/2015 1329   CHOLHDL 6.9 (H) 06/24/2016 1416   VLDL NOT CALC 06/24/2016 1416   LDLCALC NOT CALC 06/24/2016 1416   LDLCALC 162 (H) 07/07/2015 1052    Other Studies Reviewed Today:  Chest x-ray 04/21/2016: FINDINGS: Dual lead left-sided pacemaker remains in place. The cardiomediastinal contours are normal. The lungs are clear. Pulmonary vasculature is normal. No consolidation, pleural effusion, or pneumothorax. No acute osseous abnormalities are seen. IMPRESSION: No acute pulmonary process.  Assessment and Plan:  1. History of inappropriate sinus tachycardia with previous sinus node modification by Dr. Ladona Ridgelaylor in 2004 and [facility]. This is the patient's primary cardiac diagnosis and she follows with Dr. Ladona Ridgelaylor. She is due for a follow-up pacer interrogation and has a visit scheduled in November. She continues on carvedilol.  2. Hyperlipidemia, starting her back on simvastatin which she had tolerated in the past. Agree with referral to endocrinology as well in light of poorly controlled type 2 diabetes mellitus. Omega-3 supplements would also be reasonable.  3. Leg swelling, improved with resumption of Lasix which she had been on previously. Is and potassium normal. Will recheck BMET in a few weeks to see if we need to start a low-dose potassium supplement.  4. History of hypertension, blood pressure is normal today.  Current medicines were reviewed with the patient today.   Orders Placed This Encounter  Procedures  . Basic metabolic panel    Disposition: Follow-up with me in 6 months.  Signed, Jonelle SidleSamuel G. Shaneece Stockburger, MD, [facility] Center[facility]ACC 07/04/2016 10:09 AM    [facility] [address] [place], [place], [place]  [id number] Phone: [phone]; Fax: [phone]

## 2016-07-03 NOTE — Progress Notes (Signed)
BP 126/70 (BP Location: Left Arm, Patient Position: Sitting, Cuff Size: Normal)   Pulse 79   Temp 97.2 F (36.2 C)   Ht 5\' 4"  (1.626 m)   Wt 211 lb 12 oz (96 kg)   SpO2 97%   BMI 36.35 kg/m    Subjective:    Patient ID: [name], female    DOB: Jan 28, 1966, 50 y.o.   MRN: [id number]  HPI: [name] is a 50 y.o. female presenting on 07/03/2016 for Follow-up   HPI   Pt hasn't heard from pain clinic. Referral faxed from here on 06/27/16.  Pt says she tried to get in with MH but couldn't.  She says she was told she had to get paystubs.  She says the pay person at her job is out on maternity.  Pt went to ER few days ago and lasix restarted.  Pt states this am fbs 181.  She says usually am fbs is around 100.   Relevant past medical, surgical, family and social history reviewed and updated as indicated. Interim medical history since our last visit reviewed. Allergies and medications reviewed and updated.  Current Outpatient Prescriptions:  .  albuterol (PROVENTIL) (2.5 MG/3ML) 0.083% nebulizer solution, Take 3 mLs (2.5 mg total) by nebulization every 6 (six) hours as needed for wheezing or shortness of breath., Disp: 75 mL, Rfl: 12 .  aspirin EC 81 MG tablet, Take 81 mg by mouth daily., Disp: , Rfl:  .  BD PEN NEEDLE NANO U/F 32G X 4 MM MISC, 1 each by Does not apply route 4 (four) times daily., Disp: 120 each, Rfl: 0 .  budesonide-formoterol (SYMBICORT) 160-4.5 MCG/ACT inhaler, Inhale 2 puffs into the lungs every 12 (twelve) hours., Disp: 1 Inhaler, Rfl: 1 .  carvedilol (COREG) 12.5 MG tablet, Take 1 tablet (12.5 mg total) by mouth 2 (two) times daily with a meal., Disp: 60 tablet, Rfl: 0 .  clopidogrel (PLAVIX) 75 MG tablet, Take 1 tablet (75 mg total) by mouth daily., Disp: 30 tablet, Rfl: 0 .  diclofenac sodium (VOLTAREN) 1 % GEL, Apply 4 g topically 4 (four) times daily., Disp: 200 g, Rfl: 11 .  furosemide (LASIX) 40 MG tablet, Take 1 tablet (40 mg total) by mouth  every morning., Disp: 30 tablet, Rfl: 0 .  gabapentin (NEURONTIN) 300 MG capsule, Take 1 capsule (300 mg total) by mouth 3 (three) times daily., Disp: 42 capsule, Rfl: 0 .  insulin aspart (NOVOLOG) 100 UNIT/ML injection, Inject 15 Units into the skin 3 (three) times daily before meals., Disp: 15 mL, Rfl: 0 .  LEVEMIR FLEXTOUCH 100 UNIT/ML Pen, Inject 80 Units into the skin daily at 10 pm., Disp: 15 mL, Rfl: 0 .  metFORMIN (GLUCOPHAGE) 1000 MG tablet, Take 1 tablet (1,000 mg total) by mouth 2 (two) times daily with a meal., Disp: 60 tablet, Rfl: 1 .  nitroGLYCERIN (NITROSTAT) 0.4 MG SL tablet, Place 1 tablet (0.4 mg total) under the tongue every 5 (five) minutes as needed for chest pain., Disp: 30 tablet, Rfl: 0 .  OXYGEN, Inhale 3 L/day into the lungs at bedtime., Disp: , Rfl:  .  tiZANidine (ZANAFLEX) 4 MG tablet, Take 1 tablet (4 mg total) by mouth 3 (three) times daily., Disp: 42 tablet, Rfl: 0 .  acetaminophen-codeine (TYLENOL #3) 300-30 MG tablet, One every 4 hours as needed for cough (Patient not taking: Reported on 07/03/2016), Disp: 40 tablet, Rfl: 0 .  hydrOXYzine (ATARAX/VISTARIL) 25 MG tablet, Take 1-2 tablets (  25-50 mg total) by mouth every 6 (six) hours as needed for anxiety. (Patient not taking: Reported on 07/03/2016), Disp: 20 tablet, Rfl: 0 .  ibuprofen (ADVIL,MOTRIN) 800 MG tablet, TAKE 1 TABLET(800 MG) BY MOUTH EVERY 8 HOURS AS NEEDED (Patient not taking: Reported on 07/03/2016), Disp: 60 tablet, Rfl: 0 .  OLANZapine (ZYPREXA) 5 MG tablet, Take 1 tablet (5 mg total) by mouth at bedtime. (Patient not taking: Reported on 07/03/2016), Disp: 30 tablet, Rfl: 1 .  pantoprazole (PROTONIX) 40 MG tablet, TAKE 1 TABLET BY MOUTH DAILY FOR STOMACH (Patient not taking: Reported on 07/03/2016), Disp: 30 tablet, Rfl: 0 .  potassium chloride SA (K-DUR,KLOR-CON) 20 MEQ tablet, Take 1 tablet (20 mEq total) by mouth daily. (Patient not taking: Reported on 07/03/2016), Disp: 30 tablet, Rfl: 0 .   sertraline (ZOLOFT) 100 MG tablet, TAKE 1 TABLET(100 MG) BY MOUTH DAILY (Patient not taking: Reported on 07/03/2016), Disp: 30 tablet, Rfl: 0 .  simvastatin (ZOCOR) 40 MG tablet, Take 1 tablet (40 mg total) by mouth at bedtime. (Patient not taking: Reported on 07/03/2016), Disp: 30 tablet, Rfl: 0   Review of Systems  Constitutional: Negative for appetite change, chills, diaphoresis, fatigue, fever and unexpected weight change.  HENT: Negative for congestion, dental problem, drooling, ear pain, facial swelling, hearing loss, mouth sores, sneezing, sore throat, trouble swallowing and voice change.   Eyes: Negative for pain, discharge, redness, itching and visual disturbance.  Respiratory: Positive for cough. Negative for choking, shortness of breath and wheezing.   Cardiovascular: Positive for leg swelling. Negative for chest pain and palpitations.  Gastrointestinal: Positive for abdominal pain. Negative for blood in stool, constipation, diarrhea and vomiting.  Endocrine: Negative for cold intolerance, heat intolerance and polydipsia.  Genitourinary: Negative for decreased urine volume, dysuria and hematuria.  Musculoskeletal: Negative for arthralgias, back pain and gait problem.  Skin: Negative for rash.  Allergic/Immunologic: Negative for environmental allergies.  Neurological: Positive for headaches. Negative for seizures, syncope and light-headedness.  Hematological: Negative for adenopathy.  Psychiatric/Behavioral: Positive for dysphoric mood. Negative for agitation and suicidal ideas. The patient is not nervous/anxious.     Per HPI unless specifically indicated above     Objective:    BP 126/70 (BP Location: Left Arm, Patient Position: Sitting, Cuff Size: Normal)   Pulse 79   Temp 97.2 F (36.2 C)   Ht 5\' 4"  (1.626 m)   Wt 211 lb 12 oz (96 kg)   SpO2 97%   BMI 36.35 kg/m   Wt Readings from Last 3 Encounters:  07/03/16 211 lb 12 oz (96 kg)  06/30/16 216 lb (98 kg)  06/26/16  216 lb 8 oz (98.2 kg)    Physical Exam  Constitutional: She is oriented to person, place, and time. She appears well-developed and well-nourished.  HENT:  Head: Normocephalic and atraumatic.  Neck: Neck supple.  Cardiovascular: Normal rate and regular rhythm.   Pulmonary/Chest: Effort normal and breath sounds normal.  Abdominal: Soft. Bowel sounds are normal. She exhibits no mass. There is no hepatosplenomegaly. There is no tenderness.  Musculoskeletal: She exhibits edema (trace BLE).  Lymphadenopathy:    She has no cervical adenopathy.  Neurological: She is alert and oriented to person, place, and time.  Skin: Skin is warm and dry.  Psychiatric: She has a normal mood and affect. Her behavior is normal.  Vitals reviewed.       Assessment & Plan:   Encounter Diagnoses  Name Primary?  . Essential hypertension Yes  . Uncontrolled type  2 diabetes mellitus with diabetic polyneuropathy, with long-term current use of insulin (HCC)   . Chronic obstructive pulmonary disease, unspecified COPD type (HCC)   . Mood disorder (HCC)   . Cigarette nicotine dependence with nicotine-induced disorder   . Class 2 obesity with body mass index (BMI) of 36.0 to 36.9 in adult, unspecified obesity type, unspecified whether serious comorbidity present   . Other chronic pain   . Cardiac pacemaker in situ   . Irritable bowel syndrome, unspecified type      -Check bmp today in light of recent readdition of lasix and she is not on her potassium.    Change to rx lipitor for lipids and add Fish oil 2 daily. Counseled to watch lowfat diet  -Refer to endocrinology for uncontrolled DM.  She is given blood sugar log and instructed to take that with her to her appointment  -pt counseled [facility]  -[facility] and was told to [facility].  She is counseled to take care of this so she can get in with MH for treatment     -Pt has cardiology appointment  tomorrow with dr Diona BrownerMcDowell  -she says she has appointment November 28 with Gastroenterology  -she says she has appointment with Dr Ladona Ridgeltaylor for pacemaker on November 2  -she is counseled on smoking cessation  -follow up one month.  RTO sooner prn  (The duration of this appointment visit was 25 minutes of face-to-face time with the patient.  Greater than 50% of this time was spent in counseling, explanation of diagnosis, planning of further management, and coordination of care.)

## 2016-07-04 ENCOUNTER — Encounter: Payer: Self-pay | Admitting: Cardiology

## 2016-07-04 ENCOUNTER — Ambulatory Visit (INDEPENDENT_AMBULATORY_CARE_PROVIDER_SITE_OTHER): Payer: Medicaid Other | Admitting: Cardiology

## 2016-07-04 VITALS — BP 110/60 | HR 65 | Ht 64.0 in | Wt 213.0 lb

## 2016-07-04 DIAGNOSIS — I495 Sick sinus syndrome: Secondary | ICD-10-CM

## 2016-07-04 DIAGNOSIS — I10 Essential (primary) hypertension: Secondary | ICD-10-CM

## 2016-07-04 DIAGNOSIS — E782 Mixed hyperlipidemia: Secondary | ICD-10-CM | POA: Diagnosis not present

## 2016-07-04 DIAGNOSIS — R600 Localized edema: Secondary | ICD-10-CM | POA: Diagnosis not present

## 2016-07-04 MED ORDER — SIMVASTATIN 40 MG PO TABS: 40.0000 mg | ORAL_TABLET | Freq: Every evening | ORAL | 6 refills | Status: DC

## 2016-07-04 NOTE — Patient Instructions (Signed)
Medication Instructions:   Stop Lipitor.  Begin Simvastatin 40mg  every evening.  Continue all other medications.    Labwork:  BMET - due in 2 weeks - order given today.  Office will contact with results via phone or letter.    Testing/Procedures: none  Follow-Up: Your physician wants you to follow up in: 6 months.  You will receive a reminder letter in the mail one-two months in advance.  If you don't receive a letter, please call our office to schedule the follow up appointment - [place].    Any Other Special Instructions Will Be Listed Below (If Applicable).  If you need a refill on your cardiac medications before your next appointment, please call your pharmacy.

## 2016-07-06 ENCOUNTER — Other Ambulatory Visit: Payer: Self-pay | Admitting: Physician Assistant

## 2016-07-10 ENCOUNTER — Ambulatory Visit: Payer: Medicaid Other | Admitting: Physician Assistant

## 2016-07-10 ENCOUNTER — Encounter: Payer: Self-pay | Admitting: Physician Assistant

## 2016-07-10 VITALS — BP 120/72 | HR 61 | Temp 98.1°F | Ht 64.0 in | Wt 214.2 lb

## 2016-07-10 DIAGNOSIS — F17219 Nicotine dependence, cigarettes, with unspecified nicotine-induced disorders: Secondary | ICD-10-CM

## 2016-07-10 DIAGNOSIS — J069 Acute upper respiratory infection, unspecified: Secondary | ICD-10-CM

## 2016-07-10 MED ORDER — BENZONATATE 100 MG PO CAPS: ORAL_CAPSULE | ORAL | 3 refills | Status: DC

## 2016-07-10 NOTE — Progress Notes (Signed)
BP 120/72 (BP Location: Left Arm, Patient Position: Sitting, Cuff Size: Normal)   Pulse 61   Temp 98.1 F (36.7 C) (Other (Comment))   Ht 5\' 4"  (1.626 m)   Wt 214 lb 3.2 oz (97.2 kg)   SpO2 96%   BMI 36.77 kg/m    Subjective:    Patient ID: [name], female    DOB: 18-Jun-1966, 50 y.o.   MRN: [id number]  HPI: [name] is a 50 y.o. female presenting on 07/10/2016 for Cough (and sinus congestion for 3 days, fever at hs)   HPI   Chief Complaint  Patient presents with  . Cough    and sinus congestion for 3 days, fever at hs    Sick x 3 day. Stepdaughter also sick.  Pt still smoking.  Not taking any OTCs    Relevant past medical, surgical, family and social history reviewed and updated as indicated. Interim medical history since our last visit reviewed. Allergies and medications reviewed and updated.   Current Outpatient Prescriptions:  .  albuterol (PROVENTIL) (2.5 MG/3ML) 0.083% nebulizer solution, Take 3 mLs (2.5 mg total) by nebulization every 6 (six) hours as needed for wheezing or shortness of breath., Disp: 75 mL, Rfl: 12 .  aspirin EC 81 MG tablet, Take 81 mg by mouth daily., Disp: , Rfl:  .  BD PEN NEEDLE NANO U/F 32G X 4 MM MISC, 1 each by Does not apply route 4 (four) times daily., Disp: 120 each, Rfl: 0 .  budesonide-formoterol (SYMBICORT) 160-4.5 MCG/ACT inhaler, Inhale 2 puffs into the lungs every 12 (twelve) hours., Disp: 1 Inhaler, Rfl: 1 .  carvedilol (COREG) 12.5 MG tablet, Take 1 tablet (12.5 mg total) by mouth 2 (two) times daily with a meal., Disp: 60 tablet, Rfl: 0 .  clopidogrel (PLAVIX) 75 MG tablet, TAKE 1 TABLET BY MOUTH EVERY DAY, Disp: 30 tablet, Rfl: 1 .  diclofenac sodium (VOLTAREN) 1 % GEL, Apply 4 g topically 4 (four) times daily., Disp: 200 g, Rfl: 11 .  furosemide (LASIX) 40 MG tablet, Take 1 tablet (40 mg total) by mouth every morning., Disp: 30 tablet, Rfl: 0 .  gabapentin (NEURONTIN) 300 MG capsule, Take 1 capsule (300 mg total)  by mouth 3 (three) times daily., Disp: 42 capsule, Rfl: 0 .  LEVEMIR FLEXTOUCH 100 UNIT/ML Pen, Inject 80 Units into the skin daily at 10 pm., Disp: 15 mL, Rfl: 0 .  metFORMIN (GLUCOPHAGE) 1000 MG tablet, Take 1 tablet (1,000 mg total) by mouth 2 (two) times daily with a meal., Disp: 60 tablet, Rfl: 1 .  nitroGLYCERIN (NITROSTAT) 0.4 MG SL tablet, Place 1 tablet (0.4 mg total) under the tongue every 5 (five) minutes as needed for chest pain., Disp: 30 tablet, Rfl: 0 .  NOVOLOG FLEXPEN 100 UNIT/ML FlexPen, ADMINISTER 15 UNITS UNDER THE SKIN THREE TIMES DAILY BEFORE MEALS, Disp: 15 mL, Rfl: 0 .  OXYGEN, Inhale 3 L/day into the lungs at bedtime., Disp: , Rfl:  .  simvastatin (ZOCOR) 40 MG tablet, Take 1 tablet (40 mg total) by mouth every evening., Disp: 30 tablet, Rfl: 6 .  tiZANidine (ZANAFLEX) 4 MG tablet, Take 1 tablet (4 mg total) by mouth 3 (three) times daily., Disp: 42 tablet, Rfl: 0 .  ibuprofen (ADVIL,MOTRIN) 800 MG tablet, TAKE 1 TABLET(800 MG) BY MOUTH EVERY 8 HOURS AS NEEDED (Patient not taking: Reported on 07/10/2016), Disp: 60 tablet, Rfl: 0   Review of Systems  Constitutional: Positive for chills, diaphoresis and  fever (subjective). Negative for appetite change, fatigue and unexpected weight change.  HENT: Positive for sneezing. Negative for congestion, drooling, ear pain, facial swelling, hearing loss, mouth sores, sore throat, trouble swallowing and voice change.   Eyes: Negative for pain, discharge, redness, itching and visual disturbance.  Respiratory: Positive for cough. Negative for choking, shortness of breath and wheezing.   Cardiovascular: Negative for chest pain, palpitations and leg swelling.  Gastrointestinal: Negative for abdominal pain, blood in stool, constipation, diarrhea and vomiting.  Endocrine: Negative for cold intolerance, heat intolerance and polydipsia.  Genitourinary: Negative for decreased urine volume, dysuria and hematuria.  Musculoskeletal: Positive for  arthralgias and back pain. Negative for gait problem.  Skin: Negative for rash.  Allergic/Immunologic: Negative for environmental allergies.  Neurological: Positive for headaches. Negative for seizures, syncope and light-headedness.  Hematological: Negative for adenopathy.  Psychiatric/Behavioral: Negative for agitation, dysphoric mood and suicidal ideas. The patient is not nervous/anxious.     Per HPI unless specifically indicated above     Objective:    BP 120/72 (BP Location: Left Arm, Patient Position: Sitting, Cuff Size: Normal)   Pulse 61   Temp 98.1 F (36.7 C) (Other (Comment))   Ht 5\' 4"  (1.626 m)   Wt 214 lb 3.2 oz (97.2 kg)   SpO2 96%   BMI 36.77 kg/m   Wt Readings from Last 3 Encounters:  07/10/16 214 lb 3.2 oz (97.2 kg)  07/04/16 213 lb (96.6 kg)  07/03/16 211 lb 12 oz (96 kg)    Physical Exam  Constitutional: She is oriented to person, place, and time. She appears well-developed and well-nourished.  HENT:  Head: Normocephalic and atraumatic.  Right Ear: Hearing, tympanic membrane, external ear and ear canal normal.  Left Ear: Hearing, tympanic membrane, external ear and ear canal normal.  Nose: Rhinorrhea present.  Mouth/Throat: Uvula is midline and oropharynx is clear and moist. No oropharyngeal exudate.  Nasal congestion  Neck: Neck supple.  Cardiovascular: Normal rate and regular rhythm.   Pulmonary/Chest: Effort normal and breath sounds normal. She has no wheezes.  Abdominal: Soft. Bowel sounds are normal. She exhibits no mass. There is no hepatosplenomegaly. There is no tenderness.  Musculoskeletal: She exhibits no edema.  Lymphadenopathy:    She has no cervical adenopathy.  Neurological: She is alert and oriented to person, place, and time.  Skin: Skin is warm and dry.  Psychiatric: She has a normal mood and affect. Her behavior is normal.  Vitals reviewed.       Assessment & Plan:    Encounter Diagnoses  Name Primary?  . Acute upper  respiratory infection Yes  . Cigarette nicotine dependence with nicotine-induced disorder    -counseled pt on symptomatic care.  Encouraged Rest, fluids. Plain mucinex prn. Tessalon as needed for cough. Counseled pt to avoid smoking which can make her worse and will definitely slow healing -pt to follow up as scheduled or return to office if symptoms persist or if she worsens

## 2016-07-10 NOTE — Patient Instructions (Signed)
Upper Respiratory Infection, Adult Most upper respiratory infections (URIs) are a viral infection of the air passages leading to the lungs. A URI affects the nose, throat, and upper air passages. The most common type of URI is nasopharyngitis and is typically referred to as "the common cold." URIs run their course and usually go away on their own. Most of the time, a URI does not require medical attention, but sometimes a bacterial infection in the upper airways can follow a viral infection. This is called a secondary infection. Sinus and middle ear infections are common types of secondary upper respiratory infections. Bacterial pneumonia can also complicate a URI. A URI can worsen asthma and chronic obstructive pulmonary disease (COPD). Sometimes, these complications can require emergency medical care and may be life threatening.  CAUSES Almost all URIs are caused by viruses. A virus is a type of germ and can spread from one person to another.  RISKS FACTORS You may be at risk for a URI if:   You smoke.   You have chronic heart or lung disease.  You have a weakened defense (immune) system.   You are very young or very old.   You have nasal allergies or asthma.  You work in crowded or poorly ventilated areas.  You work in health care facilities or schools. SIGNS AND SYMPTOMS  Symptoms typically develop 2-3 days after you come in contact with a cold virus. Most viral URIs last 7-10 days. However, viral URIs from the influenza virus (flu virus) can last 14-18 days and are typically more severe. Symptoms may include:   Runny or stuffy (congested) nose.   Sneezing.   Cough.   Sore throat.   Headache.   Fatigue.   Fever.   Loss of appetite.   Pain in your forehead, behind your eyes, and over your cheekbones (sinus pain).  Muscle aches.  DIAGNOSIS  Your health care provider may diagnose a URI by:  Physical exam.  Tests to check that your symptoms are not due to  another condition such as:  Strep throat.  Sinusitis.  Pneumonia.  Asthma. TREATMENT  A URI goes away on its own with time. It cannot be cured with medicines, but medicines may be prescribed or recommended to relieve symptoms. Medicines may help:  Reduce your fever.  Reduce your cough.  Relieve nasal congestion. HOME CARE INSTRUCTIONS   Take medicines only as directed by your health care provider.   Gargle warm saltwater or take cough drops to comfort your throat as directed by your health care provider.  Use a warm mist humidifier or inhale steam from a shower to increase air moisture. This may make it easier to breathe.  Drink enough fluid to keep your urine clear or pale yellow.   Eat soups and other clear broths and maintain good nutrition.   Rest as needed.   Return to work when your temperature has returned to normal or as your health care provider advises. You may need to stay home longer to avoid infecting others. You can also use a face mask and careful hand washing to prevent spread of the virus.  Increase the usage of your inhaler if you have asthma.   Do not use any tobacco products, including cigarettes, chewing tobacco, or electronic cigarettes. If you need help quitting, ask your health care provider. PREVENTION  The best way to protect yourself from getting a cold is to practice good hygiene.   Avoid oral or hand contact with people with cold   symptoms.   Wash your hands often if contact occurs.  There is no clear evidence that vitamin C, vitamin E, echinacea, or exercise reduces the chance of developing a cold. However, it is always recommended to get plenty of rest, exercise, and practice good nutrition.  SEEK MEDICAL CARE IF:   You are getting worse rather than better.   Your symptoms are not controlled by medicine.   You have chills.  You have worsening shortness of breath.  You have brown or red mucus.  You have yellow or brown nasal  discharge.  You have pain in your face, especially when you bend forward.  You have a fever.  You have swollen neck glands.  You have pain while swallowing.  You have white areas in the back of your throat. SEEK IMMEDIATE MEDICAL CARE IF:   You have severe or persistent:  Headache.  Ear pain.  Sinus pain.  Chest pain.  You have chronic lung disease and any of the following:  Wheezing.  Prolonged cough.  Coughing up blood.  A change in your usual mucus.  You have a stiff neck.  You have changes in your:  Vision.  Hearing.  Thinking.  Mood. MAKE SURE YOU:   Understand these instructions.  Will watch your condition.  Will get help right away if you are not doing well or get worse.   This information is not intended to replace advice given to you by your health care provider. Make sure you discuss any questions you have with your health care provider.   Document Released: 03/05/2001 Document Revised: 01/24/2015 Document Reviewed: 12/15/2013 Elsevier Interactive Patient Education 2016 [employer]  

## 2016-07-15 ENCOUNTER — Other Ambulatory Visit: Payer: Self-pay | Admitting: Physician Assistant

## 2016-07-18 ENCOUNTER — Other Ambulatory Visit: Payer: Self-pay | Admitting: Physician Assistant

## 2016-07-25 ENCOUNTER — Encounter: Payer: Self-pay | Admitting: Internal Medicine

## 2016-07-26 ENCOUNTER — Telehealth: Payer: Self-pay | Admitting: *Deleted

## 2016-07-26 ENCOUNTER — Other Ambulatory Visit: Payer: Self-pay | Admitting: Cardiology

## 2016-07-26 NOTE — Telephone Encounter (Signed)
Attempted to reach patient as she is scheduled as a physician appointment on the [facility] schedule.  Phone number out of service.

## 2016-07-26 NOTE — Telephone Encounter (Signed)
We do not refill gabapentin or muscle relaxants,pt needs to call pcp.Pt's phone not accepting calls,will call pcp    Called [facility] as they are closed on Friday's (today) Their PA wrote scripts

## 2016-07-26 NOTE — Telephone Encounter (Signed)
New message      *STAT* If patient is at the pharmacy, call can be transferred to refill team.   1. Which medications need to be refilled? (please list name of each medication and dose if known) gabapentin (NEURONTIN) 300 MG capsule, tiZANidine (ZANAFLEX) 4 MG tablet 2. Which pharmacy/location (including street and city if local pharmacy) is medication to be sent to? [facility] in [place]   3. Do they need a 30 day or 90 day supply? 30 or 90 days

## 2016-07-29 ENCOUNTER — Ambulatory Visit (INDEPENDENT_AMBULATORY_CARE_PROVIDER_SITE_OTHER): Payer: Medicaid Other | Admitting: *Deleted

## 2016-07-29 DIAGNOSIS — Z950 Presence of cardiac pacemaker: Secondary | ICD-10-CM

## 2016-07-29 DIAGNOSIS — I495 Sick sinus syndrome: Secondary | ICD-10-CM

## 2016-07-29 LAB — CUP PACEART INCLINIC DEVICE CHECK
Brady Statistic RA Percent Paced: 67 %
Brady Statistic RV Percent Paced: 0 %
Implantable Lead Implant Date: 20090123
Implantable Lead Location: 753860
Implantable Lead Model: 5076
Lead Channel Pacing Threshold Amplitude: 0.6 V
Lead Channel Pacing Threshold Pulse Width: 0.4 ms
Lead Channel Setting Pacing Amplitude: 2 V
Lead Channel Setting Pacing Amplitude: 2 V
Lead Channel Setting Pacing Pulse Width: 0.4 ms
Lead Channel Setting Sensing Sensitivity: 2.5 mV
MDC IDC LEAD IMPLANT DT: 20090123
MDC IDC LEAD LOCATION: 753859
MDC IDC MSMT LEADCHNL RA IMPEDANCE VALUE: 360 Ohm
MDC IDC MSMT LEADCHNL RA PACING THRESHOLD AMPLITUDE: 0.6 V
MDC IDC MSMT LEADCHNL RA PACING THRESHOLD PULSEWIDTH: 0.4 ms
MDC IDC MSMT LEADCHNL RV IMPEDANCE VALUE: 530 Ohm
MDC IDC MSMT LEADCHNL RV SENSING INTR AMPL: 7.3 mV
MDC IDC PG IMPLANT DT: 20090123
MDC IDC PG SERIAL: 573704
MDC IDC SESS DTM: 20171106050000

## 2016-07-29 NOTE — Progress Notes (Signed)
Pacemaker check in clinic. Normal device function. Thresholds, sensing, impedances consistent with previous measurements. Device programmed to maximize longevity. 2 mode switches (0% burden), EGM shows AF, duration 6 sec, +ASA and Plavix. No high ventricular rates noted. Device programmed at appropriate safety margins. Histogram distribution appropriate for patient activity level. Device programmed to optimize intrinsic conduction. Estimated longevity 1.5 years. Patient education completed. ROV with GT/R on 11/14/16.

## 2016-07-31 ENCOUNTER — Other Ambulatory Visit: Payer: Self-pay | Admitting: Physician Assistant

## 2016-07-31 DIAGNOSIS — J449 Chronic obstructive pulmonary disease, unspecified: Secondary | ICD-10-CM

## 2016-08-01 ENCOUNTER — Encounter: Payer: Self-pay | Admitting: Physician Assistant

## 2016-08-01 ENCOUNTER — Ambulatory Visit: Payer: Medicaid Other | Admitting: Physician Assistant

## 2016-08-01 VITALS — BP 130/76 | HR 79 | Temp 97.7°F | Ht 64.0 in | Wt 211.5 lb

## 2016-08-01 DIAGNOSIS — Z950 Presence of cardiac pacemaker: Secondary | ICD-10-CM

## 2016-08-01 DIAGNOSIS — F17219 Nicotine dependence, cigarettes, with unspecified nicotine-induced disorders: Secondary | ICD-10-CM

## 2016-08-01 DIAGNOSIS — J449 Chronic obstructive pulmonary disease, unspecified: Secondary | ICD-10-CM

## 2016-08-01 DIAGNOSIS — K589 Irritable bowel syndrome without diarrhea: Secondary | ICD-10-CM

## 2016-08-01 DIAGNOSIS — E1165 Type 2 diabetes mellitus with hyperglycemia: Secondary | ICD-10-CM

## 2016-08-01 DIAGNOSIS — Z6836 Body mass index (BMI) 36.0-36.9, adult: Secondary | ICD-10-CM

## 2016-08-01 DIAGNOSIS — IMO0002 Reserved for concepts with insufficient information to code with codable children: Secondary | ICD-10-CM

## 2016-08-01 DIAGNOSIS — I10 Essential (primary) hypertension: Secondary | ICD-10-CM

## 2016-08-01 DIAGNOSIS — G8929 Other chronic pain: Secondary | ICD-10-CM

## 2016-08-01 DIAGNOSIS — F39 Unspecified mood [affective] disorder: Secondary | ICD-10-CM

## 2016-08-01 DIAGNOSIS — Z23 Encounter for immunization: Secondary | ICD-10-CM

## 2016-08-01 DIAGNOSIS — F329 Major depressive disorder, single episode, unspecified: Secondary | ICD-10-CM

## 2016-08-01 DIAGNOSIS — E1142 Type 2 diabetes mellitus with diabetic polyneuropathy: Secondary | ICD-10-CM

## 2016-08-01 DIAGNOSIS — Z794 Long term (current) use of insulin: Secondary | ICD-10-CM

## 2016-08-01 DIAGNOSIS — F32A Depression, unspecified: Secondary | ICD-10-CM

## 2016-08-01 DIAGNOSIS — E669 Obesity, unspecified: Secondary | ICD-10-CM

## 2016-08-01 DIAGNOSIS — Z8659 Personal history of other mental and behavioral disorders: Secondary | ICD-10-CM

## 2016-08-01 NOTE — Progress Notes (Signed)
BP 130/76 (BP Location: Left Arm, Patient Position: Sitting, Cuff Size: Large)   Pulse 79   Temp 97.7 F (36.5 C) (Other (Comment))   Ht 5\' 4"  (1.626 m)   Wt 211 lb 8 oz (95.9 kg)   SpO2 97%   BMI 36.30 kg/m    Subjective:    Patient ID: Meagan Mesaonya S Credeur, female    DOB: June 06, 1966, 50 y.o.   MRN: [id number]  HPI: [name] is a 50 y.o. female presenting on 08/01/2016 for Follow-up   HPI   Pt says she has lots of specialist appointments upcoming:  08/06/16- pain doctor 08/05/16- endocrinologist for uncontrolled diabetes 08/20/16 for - GI for IBS  Pt says she is Expecting a call back from cardiology about possible anticoagulant.    Pt says she has tried to get appointment [facility] and says they were both "backed up" and says she was told to call back in December.    Pt has not yet been to DM eduction calss.  Says endocrinology office told her to wait until seen there.    Pt's phone isn't working right now- listed daughter-in-law's number at pt request  Pt says she is stressed due to her mother with hospice and not expected to live long  Relevant past medical, surgical, family and social history reviewed and updated as indicated. Interim medical history since our last visit reviewed. Allergies and medications reviewed and updated.   Current Outpatient Prescriptions:  .  albuterol (PROVENTIL) (2.5 MG/3ML) 0.083% nebulizer solution, Take 3 mLs (2.5 mg total) by nebulization every 6 (six) hours as needed for wheezing or shortness of breath., Disp: 75 mL, Rfl: 12 .  aspirin EC 81 MG tablet, Take 81 mg by mouth daily., Disp: , Rfl:  .  BD PEN NEEDLE NANO U/F 32G X 4 MM MISC, 1 each by Does not apply route 4 (four) times daily., Disp: 120 each, Rfl: 0 .  budesonide-formoterol (SYMBICORT) 160-4.5 MCG/ACT inhaler, Inhale 2 puffs into the lungs every 12 (twelve) hours., Disp: 1 Inhaler, Rfl: 1 .  carvedilol (COREG) 12.5 MG tablet, Take 1 tablet (12.5 mg  total) by mouth 2 (two) times daily with a meal., Disp: 60 tablet, Rfl: 0 .  clopidogrel (PLAVIX) 75 MG tablet, TAKE 1 TABLET BY MOUTH EVERY DAY, Disp: 30 tablet, Rfl: 1 .  diclofenac sodium (VOLTAREN) 1 % GEL, Apply 4 g topically 4 (four) times daily., Disp: 200 g, Rfl: 11 .  furosemide (LASIX) 40 MG tablet, Take 1 tablet (40 mg total) by mouth every morning., Disp: 30 tablet, Rfl: 0 .  LEVEMIR FLEXTOUCH 100 UNIT/ML Pen, Inject 80 Units into the skin daily at 10 pm., Disp: 15 mL, Rfl: 0 .  metFORMIN (GLUCOPHAGE) 1000 MG tablet, Take 1 tablet (1,000 mg total) by mouth 2 (two) times daily with a meal., Disp: 60 tablet, Rfl: 1 .  nitroGLYCERIN (NITROSTAT) 0.4 MG SL tablet, Place 1 tablet (0.4 mg total) under the tongue every 5 (five) minutes as needed for chest pain., Disp: 30 tablet, Rfl: 0 .  NOVOLOG FLEXPEN 100 UNIT/ML FlexPen, ADMINISTER 15 UNITS UNDER THE SKIN THREE TIMES DAILY BEFORE MEALS, Disp: 15 mL, Rfl: 0 .  OXYGEN, Inhale 3 L/day into the lungs at bedtime., Disp: , Rfl:  .  simvastatin (ZOCOR) 40 MG tablet, Take 1 tablet (40 mg total) by mouth every evening., Disp: 30 tablet, Rfl: 6 .  gabapentin (NEURONTIN) 300 MG capsule, Take 1 capsule (300 mg  total) by mouth 3 (three) times daily. (Patient not taking: Reported on 08/01/2016), Disp: 42 capsule, Rfl: 0 .  ibuprofen (ADVIL,MOTRIN) 800 MG tablet, TAKE 1 TABLET(800 MG) BY MOUTH EVERY 8 HOURS AS NEEDED (Patient not taking: Reported on 08/01/2016), Disp: 60 tablet, Rfl: 0 .  tiZANidine (ZANAFLEX) 4 MG tablet, Take 1 tablet (4 mg total) by mouth 3 (three) times daily. (Patient not taking: Reported on 08/01/2016), Disp: 42 tablet, Rfl: 0   Review of Systems  Constitutional: Positive for diaphoresis.  Respiratory: Positive for cough.   Musculoskeletal: Positive for arthralgias and back pain.    Per HPI unless specifically indicated above     Objective:    BP 130/76 (BP Location: Left Arm, Patient Position: Sitting, Cuff Size: Large)    Pulse 79   Temp 97.7 F (36.5 C) (Other (Comment))   Ht 5\' 4"  (1.626 m)   Wt 211 lb 8 oz (95.9 kg)   SpO2 97%   BMI 36.30 kg/m   Wt Readings from Last 3 Encounters:  08/01/16 211 lb 8 oz (95.9 kg)  07/10/16 214 lb 3.2 oz (97.2 kg)  07/04/16 213 lb (96.6 kg)    Physical Exam  Constitutional: She is oriented to person, place, and time. She appears well-developed and well-nourished.  HENT:  Head: Normocephalic and atraumatic.  Neck: Neck supple.  Cardiovascular: Normal rate and regular rhythm.   Pulmonary/Chest: Effort normal and breath sounds normal.  Abdominal: Soft. Bowel sounds are normal. She exhibits no mass. There is no hepatosplenomegaly. There is no tenderness.  Musculoskeletal: She exhibits no edema.  Lymphadenopathy:    She has no cervical adenopathy.  Neurological: She is alert and oriented to person, place, and time.  Skin: Skin is warm and dry.  Psychiatric: She has a normal mood and affect. Her behavior is normal.  Vitals reviewed.   Dm foot exam done today      Assessment & Plan:   Encounter Diagnoses  Name Primary?  . Depression, unspecified depression type Yes  . History of posttraumatic stress disorder (PTSD)   . Need for prophylactic vaccination and inoculation against influenza   . Cigarette nicotine dependence with nicotine-induced disorder   . Essential hypertension   . Uncontrolled type 2 diabetes mellitus with diabetic polyneuropathy, with long-term current use of insulin (HCC)   . Chronic obstructive pulmonary disease, unspecified COPD type (HCC)   . Mood disorder (HCC)   . Class 2 obesity with body mass index (BMI) of 36.0 to 36.9 in adult, unspecified obesity type, unspecified whether serious comorbidity present   . Other chronic pain   . Cardiac pacemaker in situ   . Irritable bowel syndrome, unspecified type      -Refer to [facility] since pt says she has been having a difficult time getting an appointment for herself -Flu shot given  today -Nocturnal oximetry test ordered and awaiting scheduling -counseled smoking cessation -no changes in medication today -F/u here 2 months. RTO sooner prn

## 2016-08-05 ENCOUNTER — Other Ambulatory Visit: Payer: Self-pay | Admitting: Physician Assistant

## 2016-08-05 ENCOUNTER — Encounter: Payer: Self-pay | Admitting: "Endocrinology

## 2016-08-05 ENCOUNTER — Ambulatory Visit (INDEPENDENT_AMBULATORY_CARE_PROVIDER_SITE_OTHER): Payer: Medicaid Other | Admitting: "Endocrinology

## 2016-08-05 VITALS — BP 164/88 | HR 60 | Ht 64.0 in | Wt 219.0 lb

## 2016-08-05 DIAGNOSIS — I10 Essential (primary) hypertension: Secondary | ICD-10-CM

## 2016-08-05 DIAGNOSIS — E782 Mixed hyperlipidemia: Secondary | ICD-10-CM

## 2016-08-05 DIAGNOSIS — E66812 Obesity, class 2: Secondary | ICD-10-CM

## 2016-08-05 DIAGNOSIS — F17200 Nicotine dependence, unspecified, uncomplicated: Secondary | ICD-10-CM | POA: Diagnosis not present

## 2016-08-05 DIAGNOSIS — Z6836 Body mass index (BMI) 36.0-36.9, adult: Secondary | ICD-10-CM

## 2016-08-05 DIAGNOSIS — E1159 Type 2 diabetes mellitus with other circulatory complications: Secondary | ICD-10-CM

## 2016-08-05 DIAGNOSIS — E669 Obesity, unspecified: Secondary | ICD-10-CM | POA: Diagnosis not present

## 2016-08-05 MED ORDER — INSULIN ASPART 100 UNIT/ML FLEXPEN: 10.0000 [IU] | PEN_INJECTOR | Freq: Three times a day (TID) | SUBCUTANEOUS | 2 refills | Status: DC

## 2016-08-05 MED ORDER — LEVEMIR FLEXTOUCH 100 UNIT/ML ~~LOC~~ SOPN: 60.0000 [IU] | PEN_INJECTOR | Freq: Every day | SUBCUTANEOUS | 2 refills | Status: DC

## 2016-08-05 MED ORDER — CANAGLIFLOZIN 100 MG PO TABS: 100.0000 mg | ORAL_TABLET | Freq: Every day | ORAL | 2 refills | Status: DC

## 2016-08-05 MED ORDER — GLUCOSE BLOOD VI STRP: ORAL_STRIP | 2 refills | Status: DC

## 2016-08-05 MED ORDER — LOSARTAN POTASSIUM 25 MG PO TABS: 25.0000 mg | ORAL_TABLET | Freq: Every day | ORAL | 3 refills | Status: DC

## 2016-08-05 MED ORDER — OMEGA-3-ACID ETHYL ESTERS 1 G PO CAPS: 2.0000 g | ORAL_CAPSULE | Freq: Two times a day (BID) | ORAL | 2 refills | Status: DC

## 2016-08-05 NOTE — Progress Notes (Signed)
Subjective:    Patient ID: [name], female    DOB: [birth year]. Patient is being seen in consultation for management of diabetes requested by  Jacquelin Hawking, PA-C  Past Medical History:  Diagnosis Date  . Anxiety   . Arthritis   . Cardiac pacemaker in situ 2009   DDD [employer] -- [place] 60  . COPD with asthma (HCC)    GOLD 2-3 --  pulmologist (last visit 2011) Dr. Marchelle Gearing  . Crohn's disease (HCC)    Large intestine  . Depression 2016   PTSD  . Emphysema lung (HCC)   . Essential hypertension   . Gastroparesis   . GERD (gastroesophageal reflux disease)   . History of hiatal hernia   . History of kidney stones   . History of stroke    Jun 2011 -- right hand weakness  . History of syncope   . Inappropriate sinus tachycardia    Sinus node modification 02-25-2003 by Dr. Lewayne Bunting  . LLQ abdominal tenderness 10/20/2015  . OSA (obstructive sleep apnea)    Study done 2005 -- pt refused CPAP/previously was using nocturnal oxygen until one year ago pt states PCP is monitoring pt without  . Pelvic pain in female   . PTSD (post-traumatic stress disorder)   . Sinus node dysfunction (HCC)    Symptomatic bradycardia  . Stroke (HCC)   . Symptomatic sinus bradycardia   . Type 2 diabetes mellitus (HCC)   . Wears glasses    Past Surgical History:  Procedure Laterality Date  . ABDOMINAL HYSTERECTOMY    . BILATERAL KNEE ARTHROSCOPY W/ CHONDROMALACIA PATELLA  bilateral ---- 12-26-2010; 08-14-2009;  04-25-2008;  03-09-2007   additional same surgery, Left knee 2005;  x2 2006 ---  Right knee 2005;  2006;  x2  2007  . CARDIAC CATHETERIZATION  05-08-2001  dr Nicki Guadalajara   normal coronary arteries and LVF  . CARDIAC ELECTROPHYSIOLOGY STUDY W/  SINUS NODE MODIFICATION  02-25-2003  dr Sharlot Gowda taylor  . CARDIAC PACEMAKER PLACEMENT  10-16-2007  dr Lewayne Bunting   DDD-- [facility] 60  . CARDIOVASCULAR STRESS TEST  08-02-2010  dr Diona Browner   normal lexiscan study/   ef 59%  . CESAREAN SECTION  x2  . CHOLECYSTECTOMY  1994  . CYSTO/  URETEROSCOPIC STONE EXTRACTION  03/ 2005  . CYSTOSCOPY WITH HYDRODISTENSION AND BIOPSY N/A 05/23/2015   Procedure: CYSTOSCOPY/BIOPSY/HYDRODISTENSION;  Surgeon: Su Grand, MD;  [facility];  Service: Urology;  Laterality: N/A;  . TONSILLECTOMY  11-25-2002   Social History   Social History  . Marital status: Legally Separated    Spouse name: N/A  . Number of children: N/A  . Years of education: N/A   Social History Main Topics  . Smoking status: Current Every Day Smoker    Packs/day: 1.50    Years: 42.00    Types: Cigarettes    Start date: 02/02/1973  . Smokeless tobacco: Never Used  . Alcohol use No  . Drug use: No  . Sexual activity: Not Currently    Birth control/ protection: Post-menopausal   Other Topics Concern  . None   Social History Narrative   Married, 2 boys. 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    Outpatient Encounter Prescriptions as of 08/05/2016  Medication Sig  . albuterol (PROVENTIL) (2.5 MG/3ML) 0.083% nebulizer solution Take 3 mLs (2.5 mg total) by nebulization every 6 (six) hours as needed for wheezing or shortness of  breath.  Marland Kitchen aspirin EC 81 MG tablet Take 81 mg by mouth daily.  . BD PEN NEEDLE NANO U/F 32G X 4 MM MISC 1 each by Does not apply route 4 (four) times daily.  . budesonide-formoterol (SYMBICORT) 160-4.5 MCG/ACT inhaler Inhale 2 puffs into the lungs every 12 (twelve) hours.  . canagliflozin (INVOKANA) 100 MG TABS tablet Take 1 tablet (100 mg total) by mouth daily before breakfast.  . carvedilol (COREG) 12.5 MG tablet Take 1 tablet (12.5 mg total) by mouth 2 (two) times daily with a meal.  . clopidogrel (PLAVIX) 75 MG tablet TAKE 1 TABLET BY MOUTH EVERY DAY  . diclofenac sodium (VOLTAREN) 1 % GEL Apply 4 g topically 4 (four) times daily.  . furosemide (LASIX) 40 MG tablet Take 1 tablet (40 mg total) by mouth every morning.  Marland Kitchen  glucose blood (ACCU-CHEK GUIDE) test strip Use as instructed  . insulin aspart (NOVOLOG FLEXPEN) 100 UNIT/ML FlexPen Inject 10-16 Units into the skin 3 (three) times daily with meals.  Marland Kitchen LEVEMIR FLEXTOUCH 100 UNIT/ML Pen Inject 60 Units into the skin daily at 10 pm.  . metFORMIN (GLUCOPHAGE) 1000 MG tablet Take 1 tablet (1,000 mg total) by mouth 2 (two) times daily with a meal.  . nitroGLYCERIN (NITROSTAT) 0.4 MG SL tablet Place 1 tablet (0.4 mg total) under the tongue every 5 (five) minutes as needed for chest pain.  Marland Kitchen omega-3 acid ethyl esters (LOVAZA) 1 g capsule Take 2 capsules (2 g total) by mouth 2 (two) times daily.  . OXYGEN Inhale 3 L/day into the lungs at bedtime.  . simvastatin (ZOCOR) 40 MG tablet Take 1 tablet (40 mg total) by mouth every evening.  . [DISCONTINUED] gabapentin (NEURONTIN) 300 MG capsule Take 1 capsule (300 mg total) by mouth 3 (three) times daily. (Patient not taking: Reported on 08/01/2016)  . [DISCONTINUED] ibuprofen (ADVIL,MOTRIN) 800 MG tablet TAKE 1 TABLET(800 MG) BY MOUTH EVERY 8 HOURS AS NEEDED (Patient not taking: Reported on 08/01/2016)  . [DISCONTINUED] LEVEMIR FLEXTOUCH 100 UNIT/ML Pen Inject 80 Units into the skin daily at 10 pm.  . [DISCONTINUED] NOVOLOG FLEXPEN 100 UNIT/ML FlexPen ADMINISTER 15 UNITS UNDER THE SKIN THREE TIMES DAILY BEFORE MEALS  . [DISCONTINUED] tiZANidine (ZANAFLEX) 4 MG tablet Take 1 tablet (4 mg total) by mouth 3 (three) times daily. (Patient not taking: Reported on 08/01/2016)   No facility-administered encounter medications on file as of 08/05/2016.    ALLERGIES: Allergies  Allergen Reactions  . Amoxicillin Hives  . Flexeril [Cyclobenzaprine] Hives   VACCINATION STATUS: Immunization History  Administered Date(s) Administered  . Influenza Whole 06/23/2009  . Influenza,inj,Quad PF,36+ Mos 06/30/2015, 08/01/2016  . Pneumococcal Conjugate-13 08/30/2015  . Pneumococcal Polysaccharide-23 11/13/2009    Diabetes  She presents for  her initial diabetic visit. She has type 2 diabetes mellitus. Onset time: diagnosed at age 50 after an episode of GDM. Her disease course has been worsening. There are no hypoglycemic associated symptoms. Pertinent negatives for hypoglycemia include no confusion, headaches, pallor or seizures. Associated symptoms include blurred vision, fatigue, foot ulcerations, polydipsia and polyuria. Pertinent negatives for diabetes include no chest pain and no polyphagia. There are no hypoglycemic complications. Symptoms are worsening. Diabetic complications include a CVA and peripheral neuropathy. Risk factors for coronary artery disease include diabetes mellitus, dyslipidemia, family history, hypertension, obesity, sedentary lifestyle and tobacco exposure. Current diabetic treatments: She states she is taking Levemir 80 units daily at bedtime and NovoLog 15 units 3 times a day before meals along with  metformin 1000 mg by mouth twice a day. Her weight is stable. She is following a generally unhealthy diet. When asked about meal planning, she reported none. She has not had a previous visit with a dietitian. She never participates in exercise. Home blood sugar record trend: She did not bring any meter nor logs to review today. She admits that she does not have her meter. An ACE inhibitor/angiotensin II receptor blocker is not being taken.  Hyperlipidemia  This is a chronic problem. The current episode started more than 1 year ago. The problem is uncontrolled. Recent lipid tests were reviewed and are high. Exacerbating diseases include diabetes and obesity. Factors aggravating her hyperlipidemia include smoking. Pertinent negatives include no chest pain, myalgias or shortness of breath. Current antihyperlipidemic treatment includes statins. The current treatment provides no improvement of lipids. Risk factors for coronary artery disease include diabetes mellitus, dyslipidemia, family history, obesity, hypertension and a  sedentary lifestyle.  Hypertension  Associated symptoms include blurred vision. Pertinent negatives include no chest pain, headaches, palpitations or shortness of breath. Hypertensive end-organ damage includes CVA.      Review of Systems  Constitutional: Positive for fatigue. Negative for chills, fever and unexpected weight change.  HENT: Negative for trouble swallowing and voice change.   Eyes: Positive for blurred vision. Negative for visual disturbance.  Respiratory: Positive for cough. Negative for shortness of breath and wheezing.   Cardiovascular: Negative for chest pain, palpitations and leg swelling.  Gastrointestinal: Negative for diarrhea, nausea and vomiting.  Endocrine: Positive for polydipsia and polyuria. Negative for cold intolerance, heat intolerance and polyphagia.  Musculoskeletal: Negative for arthralgias and myalgias.  Skin: Negative for color change, pallor, rash and wound.  Neurological: Negative for seizures and headaches.  Psychiatric/Behavioral: Negative for confusion and suicidal ideas.    Objective:    BP (!) 164/88   Pulse 60   Ht 5\' 4"  (1.626 m)   Wt 219 lb (99.3 kg)   BMI 37.59 kg/m   Wt Readings from Last 3 Encounters:  08/05/16 219 lb (99.3 kg)  08/01/16 211 lb 8 oz (95.9 kg)  07/10/16 214 lb 3.2 oz (97.2 kg)    Physical Exam  Constitutional: She is oriented to person, place, and time. She appears well-developed.  HENT:  Head: Normocephalic and atraumatic.  Eyes: EOM are normal.  Neck: Normal range of motion. Neck supple. No tracheal deviation present. No thyromegaly present.  Cardiovascular: Normal rate and regular rhythm.   Pulmonary/Chest: Effort normal and breath sounds normal.  Abdominal: Soft. Bowel sounds are normal. There is no tenderness. There is no guarding.  Musculoskeletal: Normal range of motion. She exhibits no edema.  Neurological: She is alert and oriented to person, place, and time. She has normal reflexes. No cranial nerve  deficit. Coordination normal.  Skin: Skin is warm and dry. No rash noted. No erythema. No pallor.  Psychiatric: Judgment normal.  Patient is reluctant .     CMP     Component Value Date/Time   NA 135 07/03/2016 1256   NA 141 07/07/2015 1052   K 4.6 07/03/2016 1256   CL 97 (L) 07/03/2016 1256   CO2 21 07/03/2016 1256   GLUCOSE 394 (H) 07/03/2016 1256   BUN 16 07/03/2016 1256   BUN 17 07/07/2015 1052   CREATININE 0.91 07/03/2016 1256   CALCIUM 9.7 07/03/2016 1256   PROT 6.5 07/01/2016 0057   PROT 6.7 07/07/2015 1052   ALBUMIN 3.6 07/01/2016 0057   ALBUMIN 4.4 07/07/2015 1052  AST 24 07/01/2016 0057   ALT 21 07/01/2016 0057   ALKPHOS 91 07/01/2016 0057   BILITOT 0.5 07/01/2016 0057   BILITOT 0.2 07/07/2015 1052   GFRNONAA >60 07/01/2016 0057   GFRAA >60 07/01/2016 0057     Diabetic Labs (most recent): Lab Results  Component Value Date   HGBA1C 11.4 (H) 06/24/2016   HGBA1C 8.1 11/09/2015   HGBA1C 13.6 06/30/2015     Lipid Panel ( most recent) Lipid Panel     Component Value Date/Time   CHOL 261 (H) 06/24/2016 1416   CHOL 276 (H) 07/07/2015 1052   TRIG 425 (H) 06/24/2016 1416   TRIG 422 (H) 01/30/2015 1329   HDL 38 (L) 06/24/2016 1416   HDL 42 07/07/2015 1052   HDL 46 01/30/2015 1329   CHOLHDL 6.9 (H) 06/24/2016 1416   VLDL NOT CALC 06/24/2016 1416   LDLCALC NOT CALC 06/24/2016 1416   LDLCALC 162 (H) 07/07/2015 1052      Assessment & Plan:   1. DM type 2 causing vascular disease (HCC)  - Patient has currently uncontrolled symptomatic type 2 DM since  50 years of age,  with most recent A1c of 11.4 %. Recent labs reviewed.   Her diabetes is complicated by CVA, neuropathy, noncompliance/nonadherence, obesity/sedentary life, chronic heavy smoking and patient remains at a high risk for more acute and chronic complications of diabetes which include CAD, CVA, CKD, retinopathy, and neuropathy. These are all discussed in detail with the patient.  - I have  counseled the patient on diet management and weight loss, by adopting a carbohydrate restricted/protein rich diet.  - Suggestion is made for patient to avoid simple carbohydrates   from their diet including Cakes , Desserts, Ice Cream,  Soda (  diet and regular) , Sweet Tea , Candies,  Chips, Cookies, Artificial Sweeteners,   and "Sugar-free" Products . This will help patient to have stable blood glucose profile and potentially avoid unintended weight gain.  - I encouraged the patient to switch to  unprocessed or minimally processed complex starch and increased protein intake (animal or plant source), fruits, and vegetables.  - Patient is advised to stick to a routine mealtimes to eat 3 meals  a day and avoid unnecessary snacks ( to snack only to correct hypoglycemia).  - The patient will be scheduled with Norm SaltPenny Crumpton, RDN, CDE for individualized DM education.  - I have approached patient with the following individualized plan to manage diabetes and patient agrees:   - Based on her glycemic burden, she will continue to require basal/bolus insulin therapy. - Until she commits for proper monitoring for safe use of insulin, I will lower her basal insulin Levemir to 60 units QHS, and prandial insulin NovoLog to 10 units TIDAC for pre-meal BG readings of 90-150mg /dl, plus patient specific correction dose for unexpected hyperglycemia above 150mg /dl, associated with strict monitoring of glucose  AC and HS. - Patient is warned not to take insulin without proper monitoring per orders. -Adjustment parameters are given for hypo and hyperglycemia in writing. -Patient is encouraged to call clinic for blood glucose levels less than 70 or above 300 mg /dl. - I will continue metformin 1000 g by mouth twice a day and add Invokana 100 mg by mouth every morning. Side effects and precautions discussed with her. These medications are therapeutically suitable for patient.  - Patient is not a suitable candidate for  for incretin therapy, due to her high triglyceride levels and chronic heavy smoking putting her  at risk for pancreatitis.  - Patient specific target  A1c;  LDL, HDL, Triglycerides, and  Waist Circumference were discussed in detail.  2) BP/HTN: Uncontrolled.  I discussed blood pressure goal with her. I advised her to continue current medications, and  had low-dose losartan. 3) Lipids/HPL:   uncontrolled with extremely high triglyceride levels. I advised her to continue statins, and add Lovaza.  4)  Weight/Diet: CDE Consult will be initiated , exercise, and detailed carbohydrates information provided.  5) Chronic Care/Health Maintenance:  -Patient is on ACEI/ARB and Statin medications and encouraged to continue to follow up with Ophthalmology, Podiatrist at least yearly or according to recommendations, and advised to  quit smoking. I have recommended yearly flu vaccine and pneumonia vaccination at least every 5 years; moderate intensity exercise for up to 150 minutes weekly; and  sleep for at least 7 hours a day.  - 60 minutes of time was spent on the care of this patient , 50% of which was applied for counseling on diabetes complications and their preventions.  - Patient to bring meter and  blood glucose logs during their next visit.   - I advised patient to maintain close follow up with Jacquelin Hawking, PA-C for primary care needs.  Follow up plan: - Return in about 1 week (around 08/12/2016) for follow up with meter and logs- no labs.  Marquis Lunch, MD Phone: [phone]  Fax: [phone]   08/05/2016, 9:48 AM

## 2016-08-05 NOTE — Patient Instructions (Signed)

## 2016-08-06 ENCOUNTER — Other Ambulatory Visit: Payer: Self-pay

## 2016-08-06 MED ORDER — GLUCOSE BLOOD VI STRP: ORAL_STRIP | 5 refills | Status: DC

## 2016-08-07 ENCOUNTER — Other Ambulatory Visit: Payer: Self-pay

## 2016-08-07 MED ORDER — OMEGA-3-ACID ETHYL ESTERS 1 G PO CAPS: 2.0000 g | ORAL_CAPSULE | Freq: Two times a day (BID) | ORAL | 2 refills | Status: DC

## 2016-08-08 ENCOUNTER — Other Ambulatory Visit: Payer: Self-pay | Admitting: "Endocrinology

## 2016-08-08 ENCOUNTER — Telehealth: Payer: Self-pay

## 2016-08-08 MED ORDER — GEMFIBROZIL 600 MG PO TABS: 600.0000 mg | ORAL_TABLET | Freq: Two times a day (BID) | ORAL | 3 refills | Status: DC

## 2016-08-08 NOTE — Telephone Encounter (Signed)
Pts insurance will not cover lovaza. Anything else she can try?

## 2016-08-08 NOTE — Telephone Encounter (Signed)
I will send a prescription for lopid 600 mg by mouth twice a day.

## 2016-08-08 NOTE — Telephone Encounter (Signed)
Pt notified. Invokana PA approved and sent back to the pharmacy.

## 2016-08-12 ENCOUNTER — Ambulatory Visit (HOSPITAL_COMMUNITY)
Admission: RE | Admit: 2016-08-12 | Discharge: 2016-08-12 | Disposition: A | Discharge status: Home / Self Care | Payer: Medicaid Other | Source: Ambulatory Visit | Attending: Physician Assistant | Admitting: Physician Assistant

## 2016-08-12 ENCOUNTER — Encounter: Payer: Self-pay | Admitting: "Endocrinology

## 2016-08-12 ENCOUNTER — Telehealth: Payer: Self-pay | Admitting: Student

## 2016-08-12 ENCOUNTER — Other Ambulatory Visit: Payer: Self-pay | Admitting: Physician Assistant

## 2016-08-12 ENCOUNTER — Ambulatory Visit (INDEPENDENT_AMBULATORY_CARE_PROVIDER_SITE_OTHER): Payer: Medicaid Other | Admitting: "Endocrinology

## 2016-08-12 VITALS — BP 124/86 | HR 80 | Ht 64.0 in | Wt 214.0 lb

## 2016-08-12 DIAGNOSIS — M25571 Pain in right ankle and joints of right foot: Secondary | ICD-10-CM

## 2016-08-12 DIAGNOSIS — E1159 Type 2 diabetes mellitus with other circulatory complications: Secondary | ICD-10-CM

## 2016-08-12 DIAGNOSIS — I10 Essential (primary) hypertension: Secondary | ICD-10-CM | POA: Diagnosis not present

## 2016-08-12 DIAGNOSIS — E782 Mixed hyperlipidemia: Secondary | ICD-10-CM | POA: Diagnosis not present

## 2016-08-12 MED ORDER — INSULIN ASPART 100 UNIT/ML FLEXPEN: 15.0000 [IU] | PEN_INJECTOR | Freq: Three times a day (TID) | SUBCUTANEOUS | 2 refills | Status: AC

## 2016-08-12 NOTE — Patient Instructions (Signed)

## 2016-08-12 NOTE — Telephone Encounter (Signed)
Pt called stating she thinks she broke her toe. Pt states she is unsure of how or when this occurred due to loss of sensation in her feet. Pt c/o having Right foot swelling with bruising, and pain while walking. Pt states s/sx began 3 days ago (08-09-16). Pt states it is the 5th digit of her right foot which she thinks may be broken. Pt states she has taken tylenol and has applied ice to foot.  Patient was given the option of going to the ER or have PA place orders and pt go to radiology dept to have x-rays done. Pt decided she'd rather have PA place the order for her to get xray done.  Order has been placed.  Pt is to go to [facility] for xray.  Pt verbalized understanding.

## 2016-08-12 NOTE — Progress Notes (Signed)
Subjective:    Patient ID: [name], female    DOB: September 06, 1966. Patient is being seen in f/u for management of diabetes requested by  Jacquelin HawkingShannon McElroy, PA-C  Past Medical History:  Diagnosis Date  . Anxiety   . Arthritis   . Cardiac pacemaker in situ 2009   DDD [employer] -- ALTRUNA 60  . COPD with asthma (HCC)    GOLD 2-3 --  pulmologist (last visit 2011) Dr. Marchelle Gearingamaswamy  . Crohn's disease (HCC)    Large intestine  . Depression 2016   PTSD  . Emphysema lung (HCC)   . Essential hypertension   . Gastroparesis   . GERD (gastroesophageal reflux disease)   . History of hiatal hernia   . History of kidney stones   . History of stroke    Jun 2011 -- right hand weakness  . History of syncope   . Inappropriate sinus tachycardia    Sinus node modification 02-25-2003 by Dr. Lewayne BuntingGregg Taylor  . LLQ abdominal tenderness 10/20/2015  . OSA (obstructive sleep apnea)    Study done 2005 -- pt refused CPAP/previously was using nocturnal oxygen until one year ago pt states PCP is monitoring pt without  . Pelvic pain in female   . PTSD (post-traumatic stress disorder)   . Sinus node dysfunction (HCC)    Symptomatic bradycardia  . Stroke (HCC)   . Symptomatic sinus bradycardia   . Type 2 diabetes mellitus (HCC)   . Wears glasses    Past Surgical History:  Procedure Laterality Date  . ABDOMINAL HYSTERECTOMY    . BILATERAL KNEE ARTHROSCOPY W/ CHONDROMALACIA PATELLA  bilateral ---- 12-26-2010; 08-14-2009;  04-25-2008;  03-09-2007   additional same surgery, Left knee 2005;  x2 2006 ---  Right knee 2005;  2006;  x2  2007  . CARDIAC CATHETERIZATION  05-08-2001  dr Nicki Guadalajarathomas kelly   normal coronary arteries and LVF  . CARDIAC ELECTROPHYSIOLOGY STUDY W/  SINUS NODE MODIFICATION  02-25-2003  dr Sharlot Gowdagregg taylor  . CARDIAC PACEMAKER PLACEMENT  10-16-2007  dr Lewayne Buntinggregg taylor   [facility]  . CARDIOVASCULAR STRESS TEST  08-02-2010  dr Diona Brownermcdowell   normal lexiscan study/  ef 59%   . CESAREAN SECTION  x2  . CHOLECYSTECTOMY  1994  . CYSTO/  URETEROSCOPIC STONE EXTRACTION  03/ 2005  . CYSTOSCOPY WITH HYDRODISTENSION AND BIOPSY N/A 05/23/2015   Procedure: CYSTOSCOPY/BIOPSY/HYDRODISTENSION;  Surgeon: Su GrandMarc Nesi, MD;  [facility] [place];  Service: Urology;  Laterality: N/A;  . TONSILLECTOMY  11-25-2002   Social History   Social History  . Marital status: Legally Separated    Spouse name: N/A  . Number of children: N/A  . Years of education: N/A   Social History Main Topics  . Smoking status: Current Every Day Smoker    Packs/day: 1.50    Years: 42.00    Types: Cigarettes    Start date: 02/02/1973  . Smokeless tobacco: Never Used  . Alcohol use No  . Drug use: No  . Sexual activity: Not Currently    Birth control/ protection: Post-menopausal   Other Topics Concern  . None   Social History Narrative   Married, 2 boys. 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    Outpatient Encounter Prescriptions as of 08/12/2016  Medication Sig  . albuterol (PROVENTIL) (2.5 MG/3ML) 0.083% nebulizer solution Take 3 mLs (2.5 mg total) by nebulization every 6 (six) hours as needed for wheezing or shortness of  breath.  Marland Kitchen aspirin EC 81 MG tablet Take 81 mg by mouth daily.  . BD PEN NEEDLE NANO U/F 32G X 4 MM MISC 1 each by Does not apply route 4 (four) times daily.  . canagliflozin (INVOKANA) 100 MG TABS tablet Take 1 tablet (100 mg total) by mouth daily before breakfast.  . carvedilol (COREG) 12.5 MG tablet Take 1 tablet (12.5 mg total) by mouth 2 (two) times daily with a meal.  . clopidogrel (PLAVIX) 75 MG tablet TAKE 1 TABLET BY MOUTH EVERY DAY  . diclofenac sodium (VOLTAREN) 1 % GEL Apply 4 g topically 4 (four) times daily.  . furosemide (LASIX) 40 MG tablet Take 1 tablet (40 mg total) by mouth every morning.  Marland Kitchen gemfibrozil (LOPID) 600 MG tablet Take 1 tablet (600 mg total) by mouth 2 (two) times daily before a meal.  . glucose blood  (ACCU-CHEK GUIDE) test strip Use as instructed 4 x daily. E11.65  . insulin aspart (NOVOLOG FLEXPEN) 100 UNIT/ML FlexPen Inject 15-21 Units into the skin 3 (three) times daily with meals.  Marland Kitchen LEVEMIR FLEXTOUCH 100 UNIT/ML Pen Inject 60 Units into the skin daily at 10 pm.  . losartan (COZAAR) 25 MG tablet Take 1 tablet (25 mg total) by mouth daily.  . metFORMIN (GLUCOPHAGE) 1000 MG tablet TAKE 1 TABLET(1000 MG) BY MOUTH TWICE DAILY WITH A MEAL  . nitroGLYCERIN (NITROSTAT) 0.4 MG SL tablet Place 1 tablet (0.4 mg total) under the tongue every 5 (five) minutes as needed for chest pain.  . OXYGEN Inhale 3 L/day into the lungs at bedtime.  . simvastatin (ZOCOR) 40 MG tablet Take 1 tablet (40 mg total) by mouth every evening.  . SYMBICORT 160-4.5 MCG/ACT inhaler INHALE 2 PUFFS INTO THE LUNGS EVERY 12 HOURS  . [DISCONTINUED] insulin aspart (NOVOLOG FLEXPEN) 100 UNIT/ML FlexPen Inject 10-16 Units into the skin 3 (three) times daily with meals.   No facility-administered encounter medications on file as of 08/12/2016.    ALLERGIES: Allergies  Allergen Reactions  . Amoxicillin Hives  . Flexeril [Cyclobenzaprine] Hives   VACCINATION STATUS: Immunization History  Administered Date(s) Administered  . Influenza Whole 06/23/2009  . Influenza,inj,Quad PF,36+ Mos 06/30/2015, 08/01/2016  . Pneumococcal Conjugate-13 08/30/2015  . Pneumococcal Polysaccharide-23 11/13/2009    Diabetes  She presents for her follow-up diabetic visit. She has type 2 diabetes mellitus. Onset time: diagnosed at age 50 after an episode of GDM. Her disease course has been improving. There are no hypoglycemic associated symptoms. Pertinent negatives for hypoglycemia include no confusion, headaches, pallor or seizures. Associated symptoms include blurred vision, fatigue, foot ulcerations, polydipsia and polyuria. Pertinent negatives for diabetes include no chest pain and no polyphagia. There are no hypoglycemic complications. Symptoms  are improving. Diabetic complications include a CVA and peripheral neuropathy. Risk factors for coronary artery disease include diabetes mellitus, dyslipidemia, family history, hypertension, obesity, sedentary lifestyle and tobacco exposure. Her weight is stable. She is following a generally unhealthy diet. When asked about meal planning, she reported none. She has not had a previous visit with a dietitian. She never participates in exercise. Her breakfast blood glucose range is generally >200 mg/dl. Her lunch blood glucose range is generally >200 mg/dl. Her dinner blood glucose range is generally >200 mg/dl. Her overall blood glucose range is >200 mg/dl. An ACE inhibitor/angiotensin II receptor blocker is not being taken.  Hyperlipidemia  This is a chronic problem. The current episode started more than 1 year ago. The problem is uncontrolled. Recent lipid tests were  reviewed and are high. Exacerbating diseases include diabetes and obesity. Factors aggravating her hyperlipidemia include smoking. Pertinent negatives include no chest pain, myalgias or shortness of breath. Current antihyperlipidemic treatment includes statins. The current treatment provides no improvement of lipids. Risk factors for coronary artery disease include diabetes mellitus, dyslipidemia, family history, obesity, hypertension and a sedentary lifestyle.  Hypertension  Associated symptoms include blurred vision. Pertinent negatives include no chest pain, headaches, palpitations or shortness of breath. Hypertensive end-organ damage includes CVA.      Review of Systems  Constitutional: Positive for fatigue. Negative for chills, fever and unexpected weight change.  HENT: Negative for trouble swallowing and voice change.   Eyes: Positive for blurred vision. Negative for visual disturbance.  Respiratory: Positive for cough. Negative for shortness of breath and wheezing.   Cardiovascular: Negative for chest pain, palpitations and leg  swelling.  Gastrointestinal: Negative for diarrhea, nausea and vomiting.  Endocrine: Positive for polydipsia and polyuria. Negative for cold intolerance, heat intolerance and polyphagia.  Musculoskeletal: Negative for arthralgias and myalgias.  Skin: Negative for color change, pallor, rash and wound.  Neurological: Negative for seizures and headaches.  Psychiatric/Behavioral: Negative for confusion and suicidal ideas.    Objective:    BP 124/86   Pulse 80   Ht 5\' 4"  (1.626 m)   Wt 214 lb (97.1 kg)   BMI 36.73 kg/m   Wt Readings from Last 3 Encounters:  08/12/16 214 lb (97.1 kg)  08/05/16 219 lb (99.3 kg)  08/01/16 211 lb 8 oz (95.9 kg)    Physical Exam  Constitutional: She is oriented to person, place, and time. She appears well-developed.  HENT:  Head: Normocephalic and atraumatic.  Eyes: EOM are normal.  Neck: Normal range of motion. Neck supple. No tracheal deviation present. No thyromegaly present.  Cardiovascular: Normal rate and regular rhythm.   Pulmonary/Chest: Effort normal and breath sounds normal.  Abdominal: Soft. Bowel sounds are normal. There is no tenderness. There is no guarding.  Musculoskeletal: Normal range of motion. She exhibits no edema.  Neurological: She is alert and oriented to person, place, and time. She has normal reflexes. No cranial nerve deficit. Coordination normal.  Skin: Skin is warm and dry. No rash noted. No erythema. No pallor.  Psychiatric: Judgment normal.  Patient is reluctant .     CMP     Component Value Date/Time   NA 135 07/03/2016 1256   NA 141 07/07/2015 1052   K 4.6 07/03/2016 1256   CL 97 (L) 07/03/2016 1256   CO2 21 07/03/2016 1256   GLUCOSE 394 (H) 07/03/2016 1256   BUN 16 07/03/2016 1256   BUN 17 07/07/2015 1052   CREATININE 0.91 07/03/2016 1256   CALCIUM 9.7 07/03/2016 1256   PROT 6.5 07/01/2016 0057   PROT 6.7 07/07/2015 1052   ALBUMIN 3.6 07/01/2016 0057   ALBUMIN 4.4 07/07/2015 1052   AST 24 07/01/2016 0057    ALT 21 07/01/2016 0057   ALKPHOS 91 07/01/2016 0057   BILITOT 0.5 07/01/2016 0057   BILITOT 0.2 07/07/2015 1052   GFRNONAA >60 07/01/2016 0057   GFRAA >60 07/01/2016 0057     Diabetic Labs (most recent): Lab Results  Component Value Date   HGBA1C 11.4 (H) 06/24/2016   HGBA1C 8.1 11/09/2015   HGBA1C 13.6 06/30/2015     Lipid Panel ( most recent) Lipid Panel     Component Value Date/Time   CHOL 261 (H) 06/24/2016 1416   CHOL 276 (H) 07/07/2015 1052   TRIG  425 (H) 06/24/2016 1416   TRIG 422 (H) 01/30/2015 1329   HDL 38 (L) 06/24/2016 1416   HDL 42 07/07/2015 1052   HDL 46 01/30/2015 1329   CHOLHDL 6.9 (H) 06/24/2016 1416   VLDL NOT CALC 06/24/2016 1416   LDLCALC NOT CALC 06/24/2016 1416   LDLCALC 162 (H) 07/07/2015 1052      Assessment & Plan:   1. DM type 2 causing vascular disease (HCC)  - Patient has currently uncontrolled symptomatic type 2 DM since  50 years of age,  with most recent A1c of 11.4 %. Recent labs reviewed.   Her diabetes is complicated by CVA, neuropathy, noncompliance/nonadherence, obesity/sedentary life, chronic heavy smoking and patient remains at a high risk for more acute and chronic complications of diabetes which include CAD, CVA, CKD, retinopathy, and neuropathy. These are all discussed in detail with the patient.  - I have counseled the patient on diet management and weight loss, by adopting a carbohydrate restricted/protein rich diet.  - Suggestion is made for patient to avoid simple carbohydrates   from their diet including Cakes , Desserts, Ice Cream,  Soda (  diet and regular) , Sweet Tea , Candies,  Chips, Cookies, Artificial Sweeteners,   and "Sugar-free" Products . This will help patient to have stable blood glucose profile and potentially avoid unintended weight gain.  - I encouraged the patient to switch to  unprocessed or minimally processed complex starch and increased protein intake (animal or plant source), fruits, and  vegetables.  - Patient is advised to stick to a routine mealtimes to eat 3 meals  a day and avoid unnecessary snacks ( to snack only to correct hypoglycemia).  - The patient will be scheduled with Norm Salt, RDN, CDE for individualized DM education.  - I have approached patient with the following individualized plan to manage diabetes and patient agrees:   - Based on her glycemic burden, she will continue to require basal/bolus insulin therapy. -  I will continue her basal insulin Levemir 60 units QHS, and increase prandial insulin NovoLog to 15 units TIDAC for pre-meal BG readings of 90-150mg /dl, plus patient specific correction dose for unexpected hyperglycemia above 150mg /dl, associated with strict monitoring of glucose  AC and HS. - Patient is warned not to take insulin without proper monitoring per orders. -Adjustment parameters are given for hypo and hyperglycemia in writing. -Patient is encouraged to call clinic for blood glucose levels less than 70 or above 300 mg /dl. - I will continue metformin 1000 g by mouth twice a day and add Invokana 100 mg by mouth every morning. Side effects and precautions discussed with her. These medications are therapeutically suitable for patient.  - Patient is not a suitable candidate for for incretin therapy, due to her high triglyceride levels and chronic heavy smoking putting her at risk for pancreatitis.  - Patient specific target  A1c;  LDL, HDL, Triglycerides, and  Waist Circumference were discussed in detail.  2) BP/HTN: Uncontrolled.  I discussed blood pressure goal with her. I advised her to continue current medications, and  had low-dose losartan. 3) Lipids/HPL:   uncontrolled with extremely high triglyceride levels. I advised her to continue statins, and add Lovaza.  4)  Weight/Diet: CDE Consult will be initiated , exercise, and detailed carbohydrates information provided.  5) Chronic Care/Health Maintenance:  -Patient is on ACEI/ARB and  Statin medications and encouraged to continue to follow up with Ophthalmology, Podiatrist at least yearly or according to recommendations, and advised to  quit smoking. I have recommended yearly flu vaccine and pneumonia vaccination at least every 5 years; moderate intensity exercise for up to 150 minutes weekly; and  sleep for at least 7 hours a day.  - 30 minutes of time was spent on the care of this patient , 50% of which was applied for counseling on diabetes complications and their preventions.  - Patient to bring meter and  blood glucose logs during their next visit.   - I advised patient to maintain close follow up with Jacquelin Hawking, PA-C for primary care needs.  Follow up plan: - Return in about 9 weeks (around 10/14/2016) for meter, and logs.  Marquis Lunch, MD Phone: [phone]  Fax: [phone]   08/12/2016, 1:46 PM

## 2016-08-19 ENCOUNTER — Telehealth: Payer: Self-pay | Admitting: Student

## 2016-08-19 NOTE — Telephone Encounter (Signed)
Pt called back and was notified that xray was normal. Pt verbalized understanding.

## 2016-08-19 NOTE — Telephone Encounter (Signed)
-----   Message from Baldwin [place], LPN sent at [phone]  4:03 PM EST ----- Spoke to son. Will call back on Monday.

## 2016-08-20 ENCOUNTER — Ambulatory Visit (INDEPENDENT_AMBULATORY_CARE_PROVIDER_SITE_OTHER): Payer: Medicaid Other | Admitting: Gastroenterology

## 2016-08-20 ENCOUNTER — Other Ambulatory Visit (INDEPENDENT_AMBULATORY_CARE_PROVIDER_SITE_OTHER): Payer: Medicaid Other

## 2016-08-20 ENCOUNTER — Encounter (INDEPENDENT_AMBULATORY_CARE_PROVIDER_SITE_OTHER): Payer: Self-pay

## 2016-08-20 ENCOUNTER — Encounter: Payer: Self-pay | Admitting: Gastroenterology

## 2016-08-20 VITALS — BP 86/54 | HR 80 | Ht 64.0 in | Wt 210.4 lb

## 2016-08-20 DIAGNOSIS — G8929 Other chronic pain: Secondary | ICD-10-CM

## 2016-08-20 DIAGNOSIS — R1013 Epigastric pain: Secondary | ICD-10-CM

## 2016-08-20 DIAGNOSIS — K529 Noninfective gastroenteritis and colitis, unspecified: Secondary | ICD-10-CM | POA: Diagnosis not present

## 2016-08-20 DIAGNOSIS — K222 Esophageal obstruction: Secondary | ICD-10-CM

## 2016-08-20 DIAGNOSIS — F17200 Nicotine dependence, unspecified, uncomplicated: Secondary | ICD-10-CM

## 2016-08-20 DIAGNOSIS — K219 Gastro-esophageal reflux disease without esophagitis: Secondary | ICD-10-CM

## 2016-08-20 DIAGNOSIS — K589 Irritable bowel syndrome without diarrhea: Secondary | ICD-10-CM

## 2016-08-20 DIAGNOSIS — R1310 Dysphagia, unspecified: Secondary | ICD-10-CM

## 2016-08-20 LAB — SEDIMENTATION RATE: SED RATE: 53 mm/h — AB (ref 0–30)

## 2016-08-20 LAB — C-REACTIVE PROTEIN: CRP: 6.4 mg/dL (ref 0.5–20.0)

## 2016-08-20 MED ORDER — DICYCLOMINE HCL 10 MG PO CAPS: 10.0000 mg | ORAL_CAPSULE | Freq: Every day | ORAL | 3 refills | Status: DC

## 2016-08-20 MED ORDER — PANTOPRAZOLE SODIUM 40 MG PO TBEC: 40.0000 mg | DELAYED_RELEASE_TABLET | Freq: Two times a day (BID) | ORAL | 3 refills | Status: DC

## 2016-08-20 MED ORDER — NA SULFATE-K SULFATE-MG SULF 17.5-3.13-1.6 GM/177ML PO SOLN: 1.0000 | Freq: Once | ORAL | 0 refills | Status: AC

## 2016-08-20 MED ORDER — COLESTIPOL HCL 1 G PO TABS: 1.0000 g | ORAL_TABLET | Freq: Two times a day (BID) | ORAL | 3 refills | Status: DC
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
## 2016-08-20 NOTE — Patient Instructions (Addendum)
If you are age 765 or older, your body mass index should be between 23-30. Your Body mass index is 36.11 kg/m. If this is out of the aforementioned range listed, please consider follow up with your Primary Care Provider.  If you are age 50 or younger, your body mass index should be between 19-25. Your Body mass index is 36.11 kg/m. If this is out of the aformentioned range listed, please consider follow up with your Primary Care Provider.   We have sent the following medications to your pharmacy for you to pick up at your convenience:   Suprep  Bentyl take 1-2 tablets every 8 hours  Protonix 40mg  daily  Colestid 1g twice daily  Your physician has requested that you go to the basement for lab work before leaving today.  You have been scheduled for an endoscopy. Please follow written instructions given to you at your visit today. If you use inhalers (even only as needed), please bring them with you on the day of your procedure. Your physician has requested that you go to www.startemmi.com and enter the access code given to you at your visit today. This web site gives a general overview about your procedure. However, you should still follow specific instructions given to you by our office regarding your preparation for the procedure.  You have been scheduled for a colonoscopy. Please follow written instructions given to you at your visit today.  Please pick up your prep supplies at the pharmacy within the next 1-3 days. If you use inhalers (even only as needed), please bring them with you on the day of your procedure. Your physician has requested that you go to www.startemmi.com and enter the access code given to you at your visit today. This web site gives a general overview about your procedure. However, you should still follow specific instructions given to you by our office regarding your preparation for the procedure.

## 2016-08-21 ENCOUNTER — Encounter: Payer: Self-pay | Admitting: Gastroenterology

## 2016-08-21 ENCOUNTER — Other Ambulatory Visit: Payer: Self-pay

## 2016-08-21 ENCOUNTER — Ambulatory Visit: Payer: Medicaid Other | Admitting: Physician Assistant

## 2016-08-21 ENCOUNTER — Other Ambulatory Visit (INDEPENDENT_AMBULATORY_CARE_PROVIDER_SITE_OTHER): Payer: Medicaid Other

## 2016-08-21 ENCOUNTER — Other Ambulatory Visit: Payer: Medicaid Other

## 2016-08-21 ENCOUNTER — Telehealth: Payer: Self-pay

## 2016-08-21 DIAGNOSIS — R197 Diarrhea, unspecified: Secondary | ICD-10-CM

## 2016-08-21 DIAGNOSIS — R109 Unspecified abdominal pain: Secondary | ICD-10-CM

## 2016-08-21 DIAGNOSIS — K529 Noninfective gastroenteritis and colitis, unspecified: Secondary | ICD-10-CM

## 2016-08-21 DIAGNOSIS — R1310 Dysphagia, unspecified: Secondary | ICD-10-CM

## 2016-08-21 LAB — IGA: IGA: 200 mg/dL (ref 68–378)

## 2016-08-21 LAB — TISSUE TRANSGLUTAMINASE, IGA: Tissue Transglutaminase Ab, IgA: 1 U/mL (ref <4)

## 2016-08-21 NOTE — Telephone Encounter (Signed)
C diff PCR, total IGA, and fecal calprotectin ordered and faxed request to lab to add tests.

## 2016-08-21 NOTE — Telephone Encounter (Signed)
-----   Message from Ruffin FrederickSteven Paul Armbruster, MD sent at 08/21/2016  7:27 AM EST ----- Renee RivalHi Davidson Palmieri, just want to confirm the labs we obtained on this patient: - TTG was sent but she is missing total IgA if you could see if the lab can add that on to the lab that was collected - I had intended to order C Diff PCR as well as fecal calprotectin. I don't think she submitted a stool sample yet, can you ensure the fecal calprotectin is also ordered. Thanks much

## 2016-08-22 LAB — GI PROFILE, STOOL, PCR
ASTROVIRUS: NOT DETECTED
Adenovirus F 40/41: NOT DETECTED
C DIFFICILE TOXIN A/B: NOT DETECTED
CAMPYLOBACTER: NOT DETECTED
Cryptosporidium: NOT DETECTED
Cyclospora cayetanensis: NOT DETECTED
ENTEROAGGREGATIVE E COLI: NOT DETECTED
ENTEROPATHOGENIC E COLI: NOT DETECTED
ENTEROTOXIGENIC E COLI: NOT DETECTED
Entamoeba histolytica: NOT DETECTED
GIARDIA LAMBLIA: NOT DETECTED
NOROVIRUS GI/GII: NOT DETECTED
PLESIOMONAS SHIGELLOIDES: NOT DETECTED
Rotavirus A: NOT DETECTED
SHIGELLA/ENTEROINVASIVE E COLI: NOT DETECTED
Salmonella: NOT DETECTED
Sapovirus: NOT DETECTED
Shiga-toxin-producing E coli: NOT DETECTED
Vibrio cholerae: NOT DETECTED
Vibrio: NOT DETECTED
YERSINIA ENTEROCOLITICA: NOT DETECTED

## 2016-08-22 NOTE — Progress Notes (Signed)
Letter mailed

## 2016-08-26 ENCOUNTER — Telehealth: Payer: Self-pay | Admitting: Gastroenterology

## 2016-08-28 ENCOUNTER — Other Ambulatory Visit: Payer: Self-pay | Admitting: Family Medicine

## 2016-08-28 NOTE — Telephone Encounter (Signed)
Contacted the lab. The additional tests were not completed on the stool specimen that was returned on 08-21-2016 per Olegario MessierKathy at the lab. Orders are still in Epic to be completed. Pt notified and aware to have completed. [facility] as its closer to home for her.

## 2016-08-29 ENCOUNTER — Ambulatory Visit (INDEPENDENT_AMBULATORY_CARE_PROVIDER_SITE_OTHER): Payer: Medicaid Other | Admitting: Psychiatry

## 2016-08-29 ENCOUNTER — Encounter (HOSPITAL_COMMUNITY): Payer: Self-pay | Admitting: Psychiatry

## 2016-08-29 VITALS — BP 125/81 | HR 72 | Ht 64.0 in | Wt 214.0 lb

## 2016-08-29 DIAGNOSIS — Z823 Family history of stroke: Secondary | ICD-10-CM | POA: Diagnosis not present

## 2016-08-29 DIAGNOSIS — Z825 Family history of asthma and other chronic lower respiratory diseases: Secondary | ICD-10-CM | POA: Diagnosis not present

## 2016-08-29 DIAGNOSIS — F17210 Nicotine dependence, cigarettes, uncomplicated: Secondary | ICD-10-CM

## 2016-08-29 DIAGNOSIS — Z9889 Other specified postprocedural states: Secondary | ICD-10-CM

## 2016-08-29 DIAGNOSIS — Z7982 Long term (current) use of aspirin: Secondary | ICD-10-CM

## 2016-08-29 DIAGNOSIS — Z79899 Other long term (current) drug therapy: Secondary | ICD-10-CM

## 2016-08-29 DIAGNOSIS — F4310 Post-traumatic stress disorder, unspecified: Secondary | ICD-10-CM | POA: Insufficient documentation

## 2016-08-29 DIAGNOSIS — Z888 Allergy status to other drugs, medicaments and biological substances status: Secondary | ICD-10-CM

## 2016-08-29 DIAGNOSIS — Z8249 Family history of ischemic heart disease and other diseases of the circulatory system: Secondary | ICD-10-CM

## 2016-08-29 MED ORDER — FLUOXETINE HCL (PMDD) 10 MG PO CAPS: ORAL_CAPSULE | ORAL | 1 refills | Status: DC

## 2016-08-29 MED ORDER — PRAZOSIN HCL 1 MG PO CAPS: ORAL_CAPSULE | ORAL | 1 refills | Status: DC

## 2016-08-29 MED ORDER — TRAZODONE HCL 50 MG PO TABS: ORAL_TABLET | ORAL | 1 refills | Status: DC
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
## 2016-08-29 NOTE — Patient Instructions (Addendum)
1. Start fluoxetine 10 mg daily for one week, then 20 mg daily 2. Start prazosin 1 mg at night for 3 days, then 2 mg at night 3. Start Trazodone 25-50 mg at night as needed for sleep 4. Return to clinic in January 5. Will make a referral for a therapist

## 2016-08-30 ENCOUNTER — Telehealth: Payer: Self-pay

## 2016-08-30 NOTE — Telephone Encounter (Signed)
-----   Message from Ruffin Frederick, MD sent at 08/28/2016  1:17 PM EST ----- [name], can you please let this patient know her stool study was negative for infection. Labs otherwise reassuring Plan is for EGD and colonoscopy. She should be on colestid, bentyl, and protonix in the interim.   I hope these medications are providing her some benefit.   Otherwise, I am not sure when she is scheduled for her procedures, which need to be done at the hospital. Can you help clarify if she is scheduled? If not, we will need to find a place to put her on the schedule. Thanks

## 2016-08-30 NOTE — Telephone Encounter (Signed)
Spoke to patient, she understands and is okay with rescheduling her procedures to 09/27/16 at 11:30. She is continuing to take her medications as directed, states it is helping somewhat. Patient instructed to change dates/times of prep paperwork to reflect new information.

## 2016-08-30 NOTE — Telephone Encounter (Signed)
Thanks very much.

## 2016-09-20 ENCOUNTER — Encounter (HOSPITAL_COMMUNITY): Payer: Self-pay

## 2016-09-27 ENCOUNTER — Encounter (HOSPITAL_COMMUNITY)
Admission: RE | Disposition: A | Discharge status: Home / Self Care | Payer: Self-pay | Source: Ambulatory Visit | Attending: Gastroenterology

## 2016-09-27 ENCOUNTER — Ambulatory Visit (HOSPITAL_COMMUNITY): Payer: Medicaid Other | Admitting: Certified Registered Nurse Anesthetist

## 2016-09-27 ENCOUNTER — Encounter (HOSPITAL_COMMUNITY): Payer: Self-pay

## 2016-09-27 ENCOUNTER — Ambulatory Visit (HOSPITAL_COMMUNITY)
Admission: RE | Admit: 2016-09-27 | Discharge: 2016-09-27 | Disposition: A | Discharge status: Home / Self Care | Payer: Medicaid Other | Source: Ambulatory Visit | Attending: Gastroenterology | Admitting: Gastroenterology

## 2016-09-27 DIAGNOSIS — I69351 Hemiplegia and hemiparesis following cerebral infarction affecting right dominant side: Secondary | ICD-10-CM | POA: Diagnosis not present

## 2016-09-27 DIAGNOSIS — K5090 Crohn's disease, unspecified, without complications: Secondary | ICD-10-CM

## 2016-09-27 DIAGNOSIS — Z794 Long term (current) use of insulin: Secondary | ICD-10-CM | POA: Insufficient documentation

## 2016-09-27 DIAGNOSIS — Z90710 Acquired absence of both cervix and uterus: Secondary | ICD-10-CM | POA: Diagnosis not present

## 2016-09-27 DIAGNOSIS — Z87442 Personal history of urinary calculi: Secondary | ICD-10-CM | POA: Diagnosis not present

## 2016-09-27 DIAGNOSIS — Z880 Allergy status to penicillin: Secondary | ICD-10-CM | POA: Diagnosis not present

## 2016-09-27 DIAGNOSIS — K5010 Crohn's disease of large intestine without complications: Secondary | ICD-10-CM | POA: Insufficient documentation

## 2016-09-27 DIAGNOSIS — K219 Gastro-esophageal reflux disease without esophagitis: Secondary | ICD-10-CM | POA: Diagnosis not present

## 2016-09-27 DIAGNOSIS — R1310 Dysphagia, unspecified: Secondary | ICD-10-CM

## 2016-09-27 DIAGNOSIS — Z7902 Long term (current) use of antithrombotics/antiplatelets: Secondary | ICD-10-CM | POA: Diagnosis not present

## 2016-09-27 DIAGNOSIS — Z950 Presence of cardiac pacemaker: Secondary | ICD-10-CM | POA: Insufficient documentation

## 2016-09-27 DIAGNOSIS — F329 Major depressive disorder, single episode, unspecified: Secondary | ICD-10-CM | POA: Insufficient documentation

## 2016-09-27 DIAGNOSIS — M1990 Unspecified osteoarthritis, unspecified site: Secondary | ICD-10-CM | POA: Diagnosis not present

## 2016-09-27 DIAGNOSIS — K3184 Gastroparesis: Secondary | ICD-10-CM | POA: Insufficient documentation

## 2016-09-27 DIAGNOSIS — Z7982 Long term (current) use of aspirin: Secondary | ICD-10-CM | POA: Diagnosis not present

## 2016-09-27 DIAGNOSIS — K449 Diaphragmatic hernia without obstruction or gangrene: Secondary | ICD-10-CM | POA: Insufficient documentation

## 2016-09-27 DIAGNOSIS — Z888 Allergy status to other drugs, medicaments and biological substances status: Secondary | ICD-10-CM | POA: Diagnosis not present

## 2016-09-27 DIAGNOSIS — G4733 Obstructive sleep apnea (adult) (pediatric): Secondary | ICD-10-CM | POA: Insufficient documentation

## 2016-09-27 DIAGNOSIS — F4310 Post-traumatic stress disorder, unspecified: Secondary | ICD-10-CM | POA: Insufficient documentation

## 2016-09-27 DIAGNOSIS — E1143 Type 2 diabetes mellitus with diabetic autonomic (poly)neuropathy: Secondary | ICD-10-CM | POA: Insufficient documentation

## 2016-09-27 DIAGNOSIS — J449 Chronic obstructive pulmonary disease, unspecified: Secondary | ICD-10-CM | POA: Insufficient documentation

## 2016-09-27 DIAGNOSIS — K529 Noninfective gastroenteritis and colitis, unspecified: Secondary | ICD-10-CM

## 2016-09-27 DIAGNOSIS — I10 Essential (primary) hypertension: Secondary | ICD-10-CM | POA: Diagnosis not present

## 2016-09-27 DIAGNOSIS — F17210 Nicotine dependence, cigarettes, uncomplicated: Secondary | ICD-10-CM | POA: Diagnosis not present

## 2016-09-27 DIAGNOSIS — K2970 Gastritis, unspecified, without bleeding: Secondary | ICD-10-CM

## 2016-09-27 HISTORY — PX: COLONOSCOPY WITH PROPOFOL: SHX5780

## 2016-09-27 HISTORY — DX: Family history of other specified conditions: Z84.89

## 2016-09-27 HISTORY — PX: ESOPHAGOGASTRODUODENOSCOPY (EGD) WITH PROPOFOL: SHX5813

## 2016-09-27 LAB — GLUCOSE, CAPILLARY: Glucose-Capillary: 234 mg/dL — ABNORMAL HIGH (ref 65–99)

## 2016-09-27 SURGERY — COLONOSCOPY WITH PROPOFOL
Anesthesia: Monitor Anesthesia Care

## 2016-09-27 SURGERY — COLONOSCOPY
Anesthesia: Monitor Anesthesia Care

## 2016-09-27 MED ORDER — LACTATED RINGERS IV SOLN
INTRAVENOUS | Status: DC
  Administered 2016-09-27: 1000 mL via INTRAVENOUS

## 2016-09-27 MED ORDER — LIDOCAINE 2% (20 MG/ML) 5 ML SYRINGE
INTRAMUSCULAR | Status: DC | PRN
  Administered 2016-09-27: 100 mg via INTRAVENOUS

## 2016-09-27 MED ORDER — PROPOFOL 10 MG/ML IV BOLUS
INTRAVENOUS | Status: AC
  Filled 2016-09-27: qty 60

## 2016-09-27 MED ORDER — EPHEDRINE 5 MG/ML INJ
INTRAVENOUS | Status: AC
  Filled 2016-09-27: qty 10

## 2016-09-27 MED ORDER — PROPOFOL 500 MG/50ML IV EMUL
INTRAVENOUS | Status: DC | PRN
Start: 2016-09-27 — End: 2016-09-27
  Administered 2016-09-27: 100 ug/kg/min via INTRAVENOUS

## 2016-09-27 MED ORDER — EPHEDRINE SULFATE-NACL 50-0.9 MG/10ML-% IV SOSY
PREFILLED_SYRINGE | INTRAVENOUS | Status: DC | PRN
  Administered 2016-09-27: 5 mg via INTRAVENOUS

## 2016-09-27 MED ORDER — MESALAMINE 1.2 G PO TBEC: 4.8000 g | DELAYED_RELEASE_TABLET | Freq: Every day | ORAL | 3 refills | Status: DC

## 2016-09-27 MED ORDER — PHENYLEPHRINE 40 MCG/ML (10ML) SYRINGE FOR IV PUSH (FOR BLOOD PRESSURE SUPPORT)
PREFILLED_SYRINGE | INTRAVENOUS | Status: DC | PRN
  Administered 2016-09-27: 80 ug via INTRAVENOUS

## 2016-09-27 MED ORDER — PROPOFOL 10 MG/ML IV BOLUS
INTRAVENOUS | Status: DC | PRN
  Administered 2016-09-27: 30 mg via INTRAVENOUS
  Administered 2016-09-27: 50 mg via INTRAVENOUS
  Administered 2016-09-27: 20 mg via INTRAVENOUS
  Administered 2016-09-27: 30 mg via INTRAVENOUS
  Administered 2016-09-27: 40 mg via INTRAVENOUS
  Administered 2016-09-27: 20 mg via INTRAVENOUS

## 2016-09-27 MED ORDER — PREDNISONE 20 MG PO TABS: ORAL_TABLET | ORAL | 0 refills | Status: DC

## 2016-09-27 SURGICAL SUPPLY — 24 items

## 2016-09-27 NOTE — Op Note (Addendum)
[facility] Patient Name: [name] Procedure Date: 09/27/2016 MRN: [id number] Attending MD: Carlota Raspberry. Reeves Musick MD, MD Date of Birth: [birth year] CSN: [id number] Age: 51 Admit Type: Outpatient Procedure:                Colonoscopy Indications:              Chronic diarrhea, history of colonoscopy in 2010                            with right sided colonic inflammation, normal                            capsule and CT enterography as follow up. Providers:                Carlota Raspberry. Sovereign Ramiro MD, MD, Cleda Daub, RN,                            Corliss Parish, Technician Referring MD:              Medicines:                Monitored Anesthesia Care Complications:            No immediate complications. Estimated blood loss:                            Minimal. Estimated Blood Loss:     Estimated blood loss was minimal. Procedure:                Pre-Anesthesia Assessment:                           - Prior to the procedure, a History and Physical                            was performed, and patient medications and                            allergies were reviewed. The patient's tolerance of                            previous anesthesia was also reviewed. The risks                            and benefits of the procedure and the sedation                            options and risks were discussed with the patient.                            All questions were answered, and informed consent                            was obtained. Prior Anticoagulants: The patient has  taken Plavix (clopidogrel), last dose was 5 days                            prior to procedure. ASA Grade Assessment: III - A                            patient with severe systemic disease. After                            reviewing the risks and benefits, the patient was                            deemed in satisfactory condition to undergo the                             procedure.                           After obtaining informed consent, the colonoscope                            was passed under direct vision. Throughout the                            procedure, the patient's blood pressure, pulse, and                            oxygen saturations were monitored continuously. The                            EC-[id number] ([id number]) scope was introduced through                            the anus and advanced to the the cecum, identified                            by appendiceal orifice and ileocecal valve. The                            colonoscopy was performed without difficulty. The                            patient tolerated the procedure well. The quality                            of the bowel preparation was poor. The terminal                            ileum, ileocecal valve, appendiceal orifice, and                            rectum were photographed. Scope In: 10:52:28 AM Scope Out: 11:06:20 AM Scope Withdrawal Time: 0 hours 7 minutes 27 seconds  Total Procedure Duration: 0 hours 13  minutes 52 seconds  Findings:      The perianal and digital rectal examinations were normal.      A large amount of semi-liquid stool was found in the entire colon,       interfering with visualization, worst in the left colon and not as bad       in the right colon. The exam was inadequate for screening purposes.      Patchy severe inflammation characterized by erosions, erythema,       friability, granularity, loss of vascularity and large serpentine       ulcerations was found in the proximal transverse colon, at the hepatic       flexure, in the ascending colon and in the cecum. Biopsies were taken       with a cold forceps for histology.      The terminal ileum appeared normal.      The examined colonic mucosa was otherwise normal without inflammatory       changes of the left and transverse colon, but prep inadequate for       screening purposes. Impression:                - Preparation of the colon was poor.                           - Stool in the entire examined colon.                           - Patchy inflammation was found in the proximal                            transverse colon, at the hepatic flexure, in the                            ascending colon and in the cecum secondary to what                            is most likely Crohn's disease. Biopsied.                           - The examined portion of the ileum was normal. Moderate Sedation:      No moderate sedation, case performed with MAC Recommendation:           - Patient has a contact number available for                            emergencies. The signs and symptoms of potential                            delayed complications were discussed with the                            patient. Return to normal activities tomorrow.                            Written discharge instructions were provided to the  patient.                           - Resume previous diet.                           - Continue present medications.                           - Resume plavix tonight                           - Await pathology results.                           - Start prednisone 14m / day for 2 weeks, then                            taper by 535mdaily for suspected Crohn's while                            biopsies pending given severity of symptoms                           - Start Lialda 4.8 gm / day                           - Repeat colonoscopy is recommended for screening                            purposes at some point in time given inadequate                            bowel prep Procedure Code(s):        --- Professional ---                           [phone], flexible; with biopsy, single                            or multiple Diagnosis Code(s):        --- Professional ---                           K50.90, Crohn's disease, unspecified, without                             complications                           K52.9, Noninfective gastroenteritis and colitis,                            unspecified CPT copyright 2016 American Medical Association. All rights reserved. The codes documented in this report are preliminary and upon coder review may  be revised to meet current compliance requirements. StRemo Lipps. Deitrich Steve MD, MD 09/27/2016 11:23:59 AM This report has  been signed electronically. Number of Addenda: 0

## 2016-09-27 NOTE — Anesthesia Preprocedure Evaluation (Signed)
Anesthesia Evaluation  Patient identified by MRN, date of birth, ID band Patient awake    Reviewed: Allergy & Precautions, H&P , NPO status , Patient's Chart, lab work & pertinent test results  History of Anesthesia Complications (+) PONV and history of anesthetic complications  Airway Mallampati: II  TM Distance: >3 FB Neck ROM: full    Dental no notable dental hx. (+) Teeth Intact, Dental Advisory Given,    Pulmonary asthma , sleep apnea , COPD, Current Smoker,    Pulmonary exam normal breath sounds clear to auscultation       Cardiovascular hypertension, Pt. on medications Normal cardiovascular exam+ dysrhythmias + pacemaker (sinus node dysfunction)  Rhythm:regular Rate:Normal     Neuro/Psych PSYCHIATRIC DISORDERS CVA    GI/Hepatic Neg liver ROS, hiatal hernia, GERD  ,  Endo/Other  diabetes, Poorly Controlled, Type 2, Insulin Dependent  Renal/GU negative Renal ROS     Musculoskeletal  (+) Arthritis ,   Abdominal   Peds  Hematology negative hematology ROS (+)   Anesthesia Other Findings   Reproductive/Obstetrics negative OB ROS                             Anesthesia Physical  Anesthesia Plan  ASA: III  Anesthesia Plan: MAC   Post-op Pain Management:    Induction: Intravenous  Airway Management Planned: Natural Airway and Simple Face Mask  Additional Equipment:   Intra-op Plan:   Post-operative Plan:   Informed Consent: I have reviewed the patients History and Physical, chart, labs and discussed the procedure including the risks, benefits and alternatives for the proposed anesthesia with the patient or authorized representative who has indicated his/her understanding and acceptance.   Dental advisory given  Plan Discussed with: Anesthesiologist, CRNA and Surgeon  Anesthesia Plan Comments:         Anesthesia Quick Evaluation

## 2016-09-27 NOTE — Interval H&P Note (Signed)
History and Physical Interval Note:  09/27/2016 9:38 AM  [name]  has presented today for surgery, with the diagnosis of ibs/esoph stricture  The various methods of treatment have been discussed with the patient and family. After consideration of risks, benefits and other options for treatment, the patient has consented to  Procedure(s): COLONOSCOPY WITH PROPOFOL (N/A) ESOPHAGOGASTRODUODENOSCOPY (EGD) WITH PROPOFOL (N/A) as a surgical intervention .  The patient's history has been reviewed, patient examined, no change in status, stable for surgery.  I have reviewed the patient's chart and labs.  Questions were answered to the patient's satisfaction.     Reeves Forth [name]

## 2016-09-27 NOTE — Anesthesia Postprocedure Evaluation (Addendum)
Anesthesia Post Note  Patient: [name]  Procedure(s) Performed: Procedure(s) (LRB): COLONOSCOPY WITH PROPOFOL (N/A) ESOPHAGOGASTRODUODENOSCOPY (EGD) WITH PROPOFOL (N/A)  Patient location during evaluation: Endoscopy Anesthesia Type: MAC Level of consciousness: awake and alert Pain management: pain level controlled Vital Signs Assessment: post-procedure vital signs reviewed and stable Respiratory status: spontaneous breathing and respiratory function stable Cardiovascular status: stable Anesthetic complications: no       Last Vitals:  Vitals:   09/27/16 1130 09/27/16 1140  BP: 123/64 138/72  Pulse: 60 60  Resp: 17 (!) 22  Temp:      Last Pain:  Vitals:   09/27/16 0948  TempSrc: Oral                 Doyl Bitting DANIEL

## 2016-09-27 NOTE — Transfer of Care (Signed)
Immediate Anesthesia Transfer of Care Note  Patient: [name]  Procedure(s) Performed: Procedure(s): COLONOSCOPY WITH PROPOFOL (N/A) ESOPHAGOGASTRODUODENOSCOPY (EGD) WITH PROPOFOL (N/A)  Patient Location: PACU and Endoscopy Unit  Anesthesia Type:MAC  Level of Consciousness: awake, oriented and patient cooperative  Airway & Oxygen Therapy: Patient Spontanous Breathing and Patient connected to nasal cannula oxygen  Post-op Assessment: Report given to RN and Post -op Vital signs reviewed and stable  Post vital signs: Reviewed and stable  Last Vitals:  Vitals:   09/27/16 0948  BP: (!) 150/90  Pulse: 60  Resp: 16  Temp: 37.8 C    Last Pain:  Vitals:   09/27/16 0948  TempSrc: Oral         Complications: No apparent anesthesia complications

## 2016-09-27 NOTE — H&P (Signed)
HPI :  51 y/o female here for EGD to evaluate reflux and dysphagia, and colonoscopy for chronic diarrhea and ? History of Crohns. No improvement being on protonix BID, and trial of colestid and bentyl has not provided benefit. Last dose of plavix 5 days ago.   Past Medical History:  Diagnosis Date  . Anxiety   . Arthritis   . Cardiac pacemaker in situ 2009   DDD [employer] -- [place] 60  . COPD with asthma (HCC)    GOLD 2-3 --  pulmologist (last visit 2011) Dr. Marchelle Gearing  . Crohn's disease (HCC)    Large intestine  . Depression 2016   PTSD  . Emphysema lung (HCC)   . Essential hypertension   . Family history of adverse reaction to anesthesia    "father would get sick"  . Gastroparesis   . GERD (gastroesophageal reflux disease)   . History of hiatal hernia   . History of kidney stones   . History of stroke    Jun 2011 -- right hand weakness  . History of syncope   . Inappropriate sinus tachycardia    Sinus node modification 02-25-2003 by Dr. Lewayne Bunting  . LLQ abdominal tenderness 10/20/2015  . OSA (obstructive sleep apnea)    Study done 2005 -- pt refused CPAP/previously was using nocturnal oxygen until one year ago pt states PCP is monitoring pt without  . Pelvic pain in female   . PTSD (post-traumatic stress disorder)   . Sinus node dysfunction (HCC)    Symptomatic bradycardia  . Stroke (HCC)   . Symptomatic sinus bradycardia   . Type 2 diabetes mellitus (HCC)   . Wears glasses      Past Surgical History:  Procedure Laterality Date  . ABDOMINAL HYSTERECTOMY    . BILATERAL KNEE ARTHROSCOPY W/ CHONDROMALACIA PATELLA  bilateral ---- 12-26-2010; 08-14-2009;  04-25-2008;  03-09-2007   additional same surgery, Left knee 2005;  x2 2006 ---  Right knee 2005;  2006;  x2  2007  . CARDIAC CATHETERIZATION  05-08-2001  dr Nicki Guadalajara   normal coronary arteries and LVF  . CARDIAC ELECTROPHYSIOLOGY STUDY W/  SINUS NODE MODIFICATION  02-25-2003  dr Sharlot Gowda taylor  .  CARDIAC PACEMAKER PLACEMENT  10-16-2007  dr Lewayne Bunting   DDD-- [facility] 60  . CARDIOVASCULAR STRESS TEST  08-02-2010  dr Diona Browner   normal lexiscan study/  ef 59%  . CESAREAN SECTION  x2  . CHOLECYSTECTOMY  1994  . CYSTO/  URETEROSCOPIC STONE EXTRACTION  03/ 2005  . CYSTOSCOPY WITH HYDRODISTENSION AND BIOPSY N/A 05/23/2015   Procedure: CYSTOSCOPY/BIOPSY/HYDRODISTENSION;  Surgeon: Su Grand, MD;  Location: [facility];  Service: Urology;  Laterality: N/A;  . TONSILLECTOMY  11-25-2002   Family History  Problem Relation Age of Onset  . Diabetes Mother   . Asthma Mother   . Heart disease Mother   . Cirrhosis Mother   . Stroke Mother   . Heart disease Father     Deceased. MI. Mother, 2 brothers, sister, nephew also have heart disease  . Emphysema Father     Died of it. Was pt of Dr. Sherene Sires   . Heart attack Father   . Cancer Father     ? type  . Diabetes Brother   . Heart disease Brother   . Diabetes Sister   . Cirrhosis Sister   . Cirrhosis Maternal Grandmother   . Diabetes Brother   . Heart attack Brother   .  Heart disease Brother   . Seizures Brother   . Colon cancer Neg Hx   . Stomach cancer Neg Hx   . Esophageal cancer Neg Hx   . Rectal cancer Neg Hx   . Liver cancer Neg Hx    Social History  Substance Use Topics  . Smoking status: Current Every Day Smoker    Packs/day: 2.00    Years: 42.00    Types: Cigarettes    Start date: 02/02/1973  . Smokeless tobacco: Never Used     Comment: smoke about 2 packs because I recently lost my mom.  . Alcohol use No     Comment: 08-29-2016 per pt Occas.  on special occasions   No current facility-administered medications for this encounter.    Allergies  Allergen Reactions  . Flexeril [Cyclobenzaprine] Hives  . Amoxicillin Hives and Rash    Has patient had a PCN reaction causing immediate rash, facial/tongue/throat swelling, SOB or lightheadedness with hypotension: Yes Has patient had a PCN  reaction causing severe rash involving mucus membranes or skin necrosis: Yes Has patient had a PCN reaction that required hospitalization No Has patient had a PCN reaction occurring within the last 10 years: Yes If all of the above answers are "NO", then may proceed with Cephalosporin use.      Review of Systems: All systems reviewed and negative except where noted in HPI.    No results found.  Physical Exam: There were no vitals taken for this visit. Constitutional: Pleasant,well-developed, female in no acute distress. Cardiovascular: Normal rate, regular rhythm.  Pulmonary/chest: Effort normal and breath sounds normal. No wheezing, rales or rhonchi. Abdominal: Soft, nondistended, nontender.  ASSESSMENT AND PLAN: 51 y/o female with chronic GERD, with symptoms of dysphagia, no improvement on BID PPI, here for EGD with possible dilation. Also with ? History of Crohn's disease and chronic diarrhea, persistent despite trial of antidiarrheals, here for colonoscopy. Discussed risks / benefits she wished to proceed. Last dose of plavix > 5 days ago.   Ileene Patrick, MD [facility] Pager [phone]

## 2016-09-27 NOTE — Discharge Instructions (Signed)
YOU HAD AN ENDOSCOPIC PROCEDURE TODAY: Refer to the procedure report and other information in the discharge instructions given to you for any specific questions about what was found during the examination. If this information does not answer your questions, please call [employer] office at [phone] to clarify.  ° °YOU SHOULD EXPECT: Some feelings of bloating in the abdomen. Passage of more gas than usual. Walking can help get rid of the air that was put into your GI tract during the procedure and reduce the bloating. If you had a lower endoscopy (such as a colonoscopy or flexible sigmoidoscopy) you may notice spotting of blood in your stool or on the toilet paper. Some abdominal soreness may be present for a day or two, also. ° °DIET: Your first meal following the procedure should be a light meal and then it is ok to progress to your normal diet. A half-sandwich or bowl of soup is an example of a good first meal. Heavy or fried foods are harder to digest and may make you feel nauseous or bloated. Drink plenty of fluids but you should avoid alcoholic beverages for 24 hours. If you had a esophageal dilation, please see attached instructions for diet.   ° °ACTIVITY: Your care partner should take you home directly after the procedure. You should plan to take it easy, moving slowly for the rest of the day. You can resume normal activity the day after the procedure however YOU SHOULD NOT DRIVE, use power tools, machinery or perform tasks that involve climbing or major physical exertion for 24 hours (because of the sedation medicines used during the test).  ° °SYMPTOMS TO REPORT IMMEDIATELY: °A gastroenterologist can be reached at any hour. Please call [phone]  for any of the following symptoms:  °Following lower endoscopy (colonoscopy, flexible sigmoidoscopy) °Excessive amounts of blood in the stool  °Significant tenderness, worsening of abdominal pains  °Swelling of the abdomen that is new, acute  °Fever of 100° or  higher  °Following upper endoscopy (EGD, EUS, ERCP, esophageal dilation) °Vomiting of blood or coffee ground material  °New, significant abdominal pain  °New, significant chest pain or pain under the shoulder blades  °Painful or persistently difficult swallowing  °New shortness of breath  °Black, tarry-looking or red, bloody stools ° °FOLLOW UP:  °If any biopsies were taken you will be contacted by phone or by letter within the next 1-3 weeks. Call [phone]  if you have not heard about the biopsies in 3 weeks.  °Please also call with any specific questions about appointments or follow up tests. ° °

## 2016-09-27 NOTE — Op Note (Signed)
[facility] Patient Name: [name] Procedure Date: 09/27/2016 MRN: [id number] Attending MD: Carlota Raspberry. Armbruster MD, MD Date of Birth: [birth year] CSN: [id number] Age: 51 Admit Type: Outpatient Procedure:                Upper GI endoscopy Indications:              Dysphagia and GERD refractory to high dose PPI Providers:                Carlota Raspberry. Armbruster MD, MD, Cleda Daub, RN,                            Corliss Parish, Technician Referring MD:              Medicines:                Monitored Anesthesia Care Complications:            No immediate complications. Estimated blood loss:                            Minimal. Estimated Blood Loss:     Estimated blood loss was minimal. Procedure:                Pre-Anesthesia Assessment:                           - Prior to the procedure, a History and Physical                            was performed, and patient medications and                            allergies were reviewed. The patient's tolerance of                            previous anesthesia was also reviewed. The risks                            and benefits of the procedure and the sedation                            options and risks were discussed with the patient.                            All questions were answered, and informed consent                            was obtained. Prior Anticoagulants: The patient has                            taken Plavix (clopidogrel), last dose was 5 days                            prior to procedure. ASA Grade Assessment: III - A  patient with severe systemic disease. After                            reviewing the risks and benefits, the patient was                            deemed in satisfactory condition to undergo the                            procedure.                           After obtaining informed consent, the endoscope was                            passed under direct  vision. Throughout the                            procedure, the patient's blood pressure, pulse, and                            oxygen saturations were monitored continuously. The                            EG-[id number] ([id number]) scope was introduced through the                            mouth, and advanced to the second part of duodenum.                            The upper GI endoscopy was accomplished without                            difficulty. The patient tolerated the procedure                            well. Scope In: Scope Out: Findings:      Esophagogastric landmarks were identified: the Z-line was found at 39       cm, the gastroesophageal junction was found at 39 cm and the upper       extent of the gastric folds was found at 39 cm from the incisors.      The exam of the esophagus was otherwise normal. No stenosis or stricture       was identified.      A guidewire was placed and the scope was withdrawn. Dilation was       performed in the entire esophagus with a Savary dilator with no       resistance at 17 mm and 18 mm. A re-look with the endoscope showed no       mucosal wrents. Biopsies were taken with a cold forceps in the upper       third of the esophagus, in the middle third of the esophagus and in the       lower third of the esophagus for histology to rule out EoE.      Localized mild inflammation  characterized by erosions and erythema was       found on the anterior wall of the gastric body. Biopsies were taken with       a cold forceps for Helicobacter pylori testing.      The exam of the stomach was otherwise normal.      The duodenal bulb and second portion of the duodenum were normal. Impression:               - Esophagogastric landmarks identified.                           - Normal esophagus otherwise - empiric dilation                            performed to 32m without mucosal wrents, biopsies                            taken to rule out EoE                            - Gastritis. Biopsied.                           - Normal duodenal bulb and second portion of the                            duodenum. Moderate Sedation:      No moderate sedation, case performed with MAC Recommendation:           - Patient has a contact number available for                            emergencies. The signs and symptoms of potential                            delayed complications were discussed with the                            patient. Return to normal activities tomorrow.                            Written discharge instructions were provided to the                            patient.                           - Resume previous diet.                           - Continue present medications.                           - Await pathology results.                           - If pathology results show no evidence of EoE,  will recommend esophageal manometry and 24 HR pH                            impedance testing if symptoms persist                           - Trial of gaviscon PRN OTC for persistent reflux                            despite BID PPI Procedure Code(s):        --- Professional ---                           [phone], Esophagogastroduodenoscopy, flexible,                            transoral; with insertion of guide wire followed by                            passage of dilator(s) through esophagus over guide                            wire                           [id number], Esophagogastroduodenoscopy, flexible,                            transoral; with biopsy, single or multiple Diagnosis Code(s):        --- Professional ---                           K29.70, Gastritis, unspecified, without bleeding                           R13.10, Dysphagia, unspecified CPT copyright 2016 American Medical Association. All rights reserved. The codes documented in this report are preliminary and upon coder review may  be revised to meet  current compliance requirements. Remo Lipps P. Armbruster MD, MD 09/27/2016 11:28:59 AM This report has been signed electronically. Number of Addenda: 0

## 2016-09-30 ENCOUNTER — Other Ambulatory Visit: Payer: Self-pay

## 2016-09-30 ENCOUNTER — Encounter (HOSPITAL_COMMUNITY): Payer: Self-pay | Admitting: Gastroenterology

## 2016-09-30 ENCOUNTER — Telehealth: Payer: Self-pay | Admitting: Gastroenterology

## 2016-09-30 MED ORDER — MESALAMINE ER 0.375 G PO CP24: 1.5000 g | ORAL_CAPSULE | Freq: Every morning | ORAL | 1 refills | Status: DC

## 2016-09-30 NOTE — Telephone Encounter (Signed)
We can cancel the Lialda order, and we can use Apriso if that is covered. Recommend dosing at 1.5gm once daily in the morning (please check dosage for each tablet, if 0.375gm / tablet, it would be 4 tablets every morning). Can you please let her know and order this, she will be contacted with pathology results of her colonoscopy. She should otherwise be on prednisone at this time. Thanks

## 2016-09-30 NOTE — Telephone Encounter (Signed)
Spoke to patient, let her know that the pathology results are not ready yet. She checked with her pharmacy,insurance company is requesting a prior auth on the mesalamine and states it has faxed over a form. Morrie Sheldon if you have not received this form, please check with her pharmacy. Patient needs this medication ASAP. Thank you.

## 2016-09-30 NOTE — Telephone Encounter (Signed)
Dr Adela Lank, I have called [employer] to get a Prior Auth on this pts medication. Lialda nor its generic will be covered under pts medicaid plan. Per their rep Apriso, Sulfasalizine and Balsalazide are covered. Are you ok with trying any of these?

## 2016-09-30 NOTE — Telephone Encounter (Signed)
Apriso sent to [employer] [facility]. Pt informed and states that she is taking Prednisone. Instructed pt to call with any concerns.

## 2016-10-03 ENCOUNTER — Ambulatory Visit: Payer: Medicaid Other | Admitting: Physician Assistant

## 2016-10-04 ENCOUNTER — Telehealth: Payer: Self-pay | Admitting: Gastroenterology

## 2016-10-04 ENCOUNTER — Other Ambulatory Visit: Payer: Self-pay | Admitting: Gastroenterology

## 2016-10-04 DIAGNOSIS — K50111 Crohn's disease of large intestine with rectal bleeding: Secondary | ICD-10-CM

## 2016-10-04 NOTE — Telephone Encounter (Signed)
Spoke to patient, let her know that you tried to call her yesterday about her path, but was unable to reach her or lm. She asked that you please call her on this number: [phone], which is family members phone. Thank you.

## 2016-10-04 NOTE — Telephone Encounter (Signed)
Called patient and reviewed results. Please see telephone note for details.
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
## 2016-10-08 ENCOUNTER — Ambulatory Visit (HOSPITAL_COMMUNITY)
Admission: RE | Admit: 2016-10-08 | Discharge: 2016-10-08 | Disposition: A | Discharge status: Home / Self Care | Payer: Medicaid Other | Source: Ambulatory Visit | Attending: Neurology | Admitting: Neurology

## 2016-10-08 ENCOUNTER — Telehealth (HOSPITAL_COMMUNITY): Payer: Self-pay | Admitting: *Deleted

## 2016-10-08 ENCOUNTER — Ambulatory Visit (HOSPITAL_COMMUNITY): Payer: Self-pay | Admitting: Psychiatry

## 2016-10-08 ENCOUNTER — Other Ambulatory Visit (HOSPITAL_COMMUNITY): Payer: Self-pay | Admitting: Neurology

## 2016-10-08 ENCOUNTER — Other Ambulatory Visit (INDEPENDENT_AMBULATORY_CARE_PROVIDER_SITE_OTHER): Payer: Medicaid Other

## 2016-10-08 DIAGNOSIS — K50111 Crohn's disease of large intestine with rectal bleeding: Secondary | ICD-10-CM | POA: Diagnosis not present

## 2016-10-08 DIAGNOSIS — G959 Disease of spinal cord, unspecified: Secondary | ICD-10-CM | POA: Diagnosis not present

## 2016-10-08 LAB — CBC WITH DIFFERENTIAL/PLATELET
BASOS PCT: 0.4 % (ref 0.0–3.0)
Basophils Absolute: 0 10*3/uL (ref 0.0–0.1)
Eosinophils Absolute: 0.2 10*3/uL (ref 0.0–0.7)
Eosinophils Relative: 1.6 % (ref 0.0–5.0)
HCT: 35.7 % — ABNORMAL LOW (ref 36.0–46.0)
Hemoglobin: 11.7 g/dL — ABNORMAL LOW (ref 12.0–15.0)
LYMPHS ABS: 3.9 10*3/uL (ref 0.7–4.0)
Lymphocytes Relative: 37.9 % (ref 12.0–46.0)
MCHC: 32.9 g/dL (ref 30.0–36.0)
MCV: 90 fl (ref 78.0–100.0)
MONO ABS: 0.7 10*3/uL (ref 0.1–1.0)
MONOS PCT: 6.8 % (ref 3.0–12.0)
NEUTROS ABS: 5.5 10*3/uL (ref 1.4–7.7)
Neutrophils Relative %: 53.3 % (ref 43.0–77.0)
PLATELETS: 275 10*3/uL (ref 150.0–400.0)
RBC: 3.96 Mil/uL (ref 3.87–5.11)
RDW: 13.6 % (ref 11.5–15.5)
WBC: 10.3 10*3/uL (ref 4.0–10.5)

## 2016-10-08 LAB — SEDIMENTATION RATE: SED RATE: 21 mm/h (ref 0–30)

## 2016-10-08 LAB — C-REACTIVE PROTEIN: CRP: 0.2 mg/dL — ABNORMAL LOW (ref 0.5–20.0)

## 2016-10-08 NOTE — Telephone Encounter (Signed)
[phone], VOICE MESSAGE FROM THIS PHONE NUMBER TO CANCEL APPOINTMENT FOR 10/08/16.  NO REASON GIVEN.

## 2016-10-09 LAB — HEPATITIS B SURFACE ANTIGEN: HEP B S AG: NEGATIVE

## 2016-10-09 LAB — HEPATITIS C ANTIBODY: HCV Ab: NEGATIVE

## 2016-10-11 LAB — QUANTIFERON TB GOLD ASSAY (BLOOD)
INTERFERON GAMMA RELEASE ASSAY: NEGATIVE
MITOGEN-NIL SO: 4.53 [IU]/mL
QUANTIFERON TB AG MINUS NIL: 0.02 [IU]/mL
Quantiferon Nil Value: 0.03 IU/mL

## 2016-10-14 ENCOUNTER — Telehealth: Payer: Self-pay | Admitting: Gastroenterology

## 2016-10-17 ENCOUNTER — Ambulatory Visit: Payer: Medicaid Other | Admitting: Physician Assistant

## 2016-10-17 ENCOUNTER — Ambulatory Visit: Payer: Medicaid Other | Admitting: "Endocrinology

## 2016-10-21 ENCOUNTER — Ambulatory Visit: Payer: Medicaid Other | Admitting: Physician Assistant

## 2016-10-22 ENCOUNTER — Other Ambulatory Visit: Payer: Self-pay

## 2016-10-22 DIAGNOSIS — K50119 Crohn's disease of large intestine with unspecified complications: Secondary | ICD-10-CM

## 2016-10-22 MED ORDER — INFLIXIMAB 100 MG IV SOLR: 5.0000 mg/kg | INTRAVENOUS | 3 refills | Status: DC

## 2016-10-22 MED ORDER — PREDNISONE 5 MG PO TABS: 5.0000 mg | ORAL_TABLET | ORAL | 1 refills | Status: DC

## 2016-10-22 NOTE — Addendum Note (Signed)
Addended by: Leverne Humbles on: 10/22/2016 11:41 AM   Modules accepted: Orders

## 2016-10-23 ENCOUNTER — Telehealth (HOSPITAL_COMMUNITY): Payer: Self-pay | Admitting: *Deleted

## 2016-10-23 ENCOUNTER — Other Ambulatory Visit: Payer: Self-pay

## 2016-10-23 ENCOUNTER — Telehealth: Payer: Self-pay

## 2016-10-23 ENCOUNTER — Other Ambulatory Visit (HOSPITAL_COMMUNITY)
Admission: RE | Admit: 2016-10-23 | Discharge: 2016-10-23 | Disposition: A | Discharge status: Home / Self Care | Payer: Medicaid Other | Source: Ambulatory Visit | Attending: Gastroenterology | Admitting: Gastroenterology

## 2016-10-23 DIAGNOSIS — K50119 Crohn's disease of large intestine with unspecified complications: Secondary | ICD-10-CM | POA: Diagnosis not present

## 2016-10-23 LAB — HEPATIC FUNCTION PANEL
ALBUMIN: 3.6 g/dL (ref 3.5–5.0)
ALK PHOS: 81 U/L (ref 38–126)
ALT: 51 U/L (ref 14–54)
AST: 78 U/L — ABNORMAL HIGH (ref 15–41)
BILIRUBIN INDIRECT: 0.3 mg/dL (ref 0.3–0.9)
Bilirubin, Direct: 0.1 mg/dL (ref 0.1–0.5)
TOTAL PROTEIN: 6.6 g/dL (ref 6.5–8.1)
Total Bilirubin: 0.4 mg/dL (ref 0.3–1.2)

## 2016-10-23 NOTE — Telephone Encounter (Signed)
Patient scheduled for first Remicade infusion at [facility] short stay on 2/8 at 10:00, patient aware, orders in Epic and faxed to [phone].

## 2016-10-23 NOTE — Telephone Encounter (Signed)
Pt pharmacy [facility] requesting refills for Trazodone 50 mg 0.5 to 1 tablets QHS. Pt only seen one time ones Pt cancelled her f/u with provider and do not have f/u appt with provider. Pharmacy number is [phone].

## 2016-10-24 ENCOUNTER — Telehealth: Payer: Self-pay | Admitting: Gastroenterology

## 2016-10-24 ENCOUNTER — Encounter: Payer: Self-pay | Admitting: Physician Assistant

## 2016-10-24 NOTE — Telephone Encounter (Signed)
Will not prescribe medication unless she has an appointment with [place].

## 2016-10-25 NOTE — Telephone Encounter (Signed)
lmtcb on pt number on file 

## 2016-10-27 ENCOUNTER — Other Ambulatory Visit: Payer: Self-pay | Admitting: Gastroenterology

## 2016-10-28 NOTE — Telephone Encounter (Signed)
Phone rang 3 times and went to voicemail. lmtcb

## 2016-10-29 ENCOUNTER — Ambulatory Visit (HOSPITAL_COMMUNITY): Payer: Medicaid Other | Admitting: Psychiatry

## 2016-10-29 ENCOUNTER — Encounter (HOSPITAL_COMMUNITY): Payer: Self-pay

## 2016-10-29 NOTE — Telephone Encounter (Signed)
Is Apriso ok to refill?
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
## 2016-10-30 ENCOUNTER — Other Ambulatory Visit: Payer: Self-pay

## 2016-10-30 MED ORDER — MESALAMINE ER 0.375 G PO CP24: 1.5000 g | ORAL_CAPSULE | Freq: Every morning | ORAL | 1 refills | Status: DC

## 2016-10-31 ENCOUNTER — Telehealth: Payer: Self-pay | Admitting: Gastroenterology

## 2016-10-31 ENCOUNTER — Ambulatory Visit (HOSPITAL_COMMUNITY)
Admission: RE | Admit: 2016-10-31 | Discharge: 2016-10-31 | Disposition: A | Discharge status: Home / Self Care | Payer: Medicaid Other | Source: Ambulatory Visit | Attending: Gastroenterology | Admitting: Gastroenterology

## 2016-10-31 DIAGNOSIS — K50119 Crohn's disease of large intestine with unspecified complications: Secondary | ICD-10-CM | POA: Diagnosis not present

## 2016-10-31 MED ORDER — ACETAMINOPHEN 325 MG PO TABS
650.0000 mg | ORAL_TABLET | Freq: Once | ORAL | Status: AC
  Administered 2016-10-31: 650 mg via ORAL

## 2016-10-31 MED ORDER — DIPHENHYDRAMINE HCL 25 MG PO CAPS
ORAL_CAPSULE | ORAL | Status: AC
  Administered 2016-10-31: 10:00:00 50 mg via ORAL
  Filled 2016-10-31: qty 2

## 2016-10-31 MED ORDER — DIPHENHYDRAMINE HCL 25 MG PO CAPS
50.0000 mg | ORAL_CAPSULE | Freq: Once | ORAL | Status: AC
  Administered 2016-10-31: 50 mg via ORAL

## 2016-10-31 MED ORDER — ACETAMINOPHEN 325 MG PO TABS
ORAL_TABLET | ORAL | Status: AC
  Administered 2016-10-31: 650 mg via ORAL
  Filled 2016-10-31: qty 2

## 2016-10-31 MED ORDER — SODIUM CHLORIDE 0.9 % IV SOLN
5.0000 mg/kg | Freq: Once | INTRAVENOUS | Status: AC
  Administered 2016-10-31: 11:00:00 500 mg via INTRAVENOUS
  Filled 2016-10-31: qty 50

## 2016-10-31 MED ORDER — SODIUM CHLORIDE 0.9 % IV SOLN
Freq: Once | INTRAVENOUS | Status: AC
  Administered 2016-10-31: 11:00:00 via INTRAVENOUS

## 2016-10-31 NOTE — Telephone Encounter (Signed)
Yes that's fine. thanks

## 2016-10-31 NOTE — Discharge Instructions (Signed)
Infliximab injection °What is this medicine? °INFLIXIMAB (in FLIX i mab) is used to treat Crohn's disease and ulcerative colitis. It is also used to treat ankylosing spondylitis, psoriasis, and some forms of arthritis. °This medicine may be used for other purposes; ask your health care provider or pharmacist if you have questions. °COMMON BRAND NAME(S): INFLECTRA, Remicade, RENFLEXIS °What should I tell my health care provider before I take this medicine? °They need to know if you have any of these conditions: °-diabetes °-exposure to tuberculosis °-heart failure °-hepatitis or liver disease °-immune system problems °-infection °-lung or breathing disease, like COPD °-multiple sclerosis °-current or past resident of [place] °-seizure disorder °-an unusual or allergic reaction to infliximab, mouse proteins, other medicines, foods, dyes, or preservatives °-pregnant or trying to get pregnant °-breast-feeding °How should I use this medicine? °This medicine is for injection into a vein. It is usually given by a health care professional in a hospital or clinic setting. °A special MedGuide will be given to you by the pharmacist with each prescription and refill. Be sure to read this information carefully each time. °Talk to your pediatrician regarding the use of this medicine in children. Special care may be needed. °Overdosage: If you think you have taken too much of this medicine contact a poison control center or emergency room at once. °NOTE: This medicine is only for you. Do not share this medicine with others. °What if I miss a dose? °It is important not to miss your dose. Call your doctor or health care professional if you are unable to keep an appointment. °What may interact with this medicine? °Do not take this medicine with any of the following medications: °-anakinra °-rilonacept °This medicine may also interact with the following medications: °-vaccines °This list may not describe all  possible interactions. Give your health care provider a list of all the medicines, herbs, non-prescription drugs, or dietary supplements you use. Also tell them if you smoke, drink alcohol, or use illegal drugs. Some items may interact with your medicine. °What should I watch for while using this medicine? °Visit your doctor or health care professional for regular checks on your progress. °If you get a cold or other infection while receiving this medicine, call your doctor or health care professional. Do not treat yourself. This medicine may decrease your body's ability to fight infections. Before beginning therapy, your doctor may do a test to see if you have been exposed to tuberculosis. °This medicine may make the symptoms of heart failure worse in some patients. If you notice symptoms such as increased shortness of breath or swelling of the ankles or legs, contact your health care provider right away. °If you are going to have surgery or dental work, tell your health care professional or dentist that you have received this medicine. °If you take this medicine for plaque psoriasis, stay out of the sun. If you cannot avoid being in the sun, wear protective clothing and use sunscreen. Do not use sun lamps or tanning beds/booths. °What side effects may I notice from receiving this medicine? °Side effects that you should report to your doctor or health care professional as soon as possible: °-allergic reactions like skin rash, itching or hives, swelling of the face, lips, or tongue °-chest pain °-fever or chills, usually related to the infusion °-muscle or joint pain °-red, scaly patches or raised bumps on the skin °-signs of infection - fever or chills, cough, sore throat, pain or difficulty passing urine °-swollen   lymph nodes in the neck, underarm, or groin areas °-unexplained weight loss °-unusual bleeding or bruising °-unusually weak or tired °-yellowing of the eyes or skin °Side effects that usually do not  require medical attention (report to your doctor or health care professional if they continue or are bothersome): °-headache °-heartburn or stomach pain °-nausea, vomiting °This list may not describe all possible side effects. Call your doctor for medical advice about side effects. You may report side effects to FDA at 1-800-FDA-1088. °Where should I keep my medicine? °This drug is given in a hospital or clinic and will not be stored at home. °NOTE: This sheet is a summary. It may not cover all possible information. If you have questions about this medicine, talk to your doctor, pharmacist, or health care provider. °© 2017 [employer] (2008-04-27 10:26:02) ° °

## 2016-10-31 NOTE — Telephone Encounter (Signed)
Apriso refilled. 

## 2016-11-01 ENCOUNTER — Ambulatory Visit (INDEPENDENT_AMBULATORY_CARE_PROVIDER_SITE_OTHER): Payer: Medicaid Other | Admitting: Psychiatry

## 2016-11-01 ENCOUNTER — Other Ambulatory Visit: Payer: Self-pay

## 2016-11-01 ENCOUNTER — Encounter (HOSPITAL_COMMUNITY): Payer: Self-pay | Admitting: Psychiatry

## 2016-11-01 VITALS — BP 113/93 | HR 60 | Ht 64.0 in | Wt 205.6 lb

## 2016-11-01 DIAGNOSIS — F4310 Post-traumatic stress disorder, unspecified: Secondary | ICD-10-CM

## 2016-11-01 DIAGNOSIS — Z794 Long term (current) use of insulin: Secondary | ICD-10-CM | POA: Diagnosis not present

## 2016-11-01 DIAGNOSIS — Z90710 Acquired absence of both cervix and uterus: Secondary | ICD-10-CM

## 2016-11-01 DIAGNOSIS — Z7982 Long term (current) use of aspirin: Secondary | ICD-10-CM | POA: Diagnosis not present

## 2016-11-01 DIAGNOSIS — Z825 Family history of asthma and other chronic lower respiratory diseases: Secondary | ICD-10-CM | POA: Diagnosis not present

## 2016-11-01 DIAGNOSIS — F331 Major depressive disorder, recurrent, moderate: Secondary | ICD-10-CM | POA: Diagnosis not present

## 2016-11-01 DIAGNOSIS — Z8249 Family history of ischemic heart disease and other diseases of the circulatory system: Secondary | ICD-10-CM | POA: Diagnosis not present

## 2016-11-01 DIAGNOSIS — Z888 Allergy status to other drugs, medicaments and biological substances status: Secondary | ICD-10-CM

## 2016-11-01 DIAGNOSIS — Z79899 Other long term (current) drug therapy: Secondary | ICD-10-CM

## 2016-11-01 DIAGNOSIS — Z9889 Other specified postprocedural states: Secondary | ICD-10-CM

## 2016-11-01 DIAGNOSIS — Z833 Family history of diabetes mellitus: Secondary | ICD-10-CM

## 2016-11-01 DIAGNOSIS — F17210 Nicotine dependence, cigarettes, uncomplicated: Secondary | ICD-10-CM

## 2016-11-01 MED ORDER — MIRTAZAPINE 7.5 MG PO TABS: 7.5000 mg | ORAL_TABLET | Freq: Every day | ORAL | 1 refills | Status: DC

## 2016-11-01 MED ORDER — FLUOXETINE HCL 40 MG PO CAPS: 40.0000 mg | ORAL_CAPSULE | Freq: Every day | ORAL | 1 refills | Status: DC

## 2016-11-01 NOTE — Patient Instructions (Addendum)
1. Increase fluoxetine 40 mg daily 2. Start mirtazapine 7.5 mg at night 3. Return to clinic in one month 4. [employer] office for therapy [phone] [place], [place], [place] [address]

## 2016-11-01 NOTE — Telephone Encounter (Signed)
Patient wanted to let us know that her next infusion is scheduled for 11/14/16 at [facility]. Patient has follow up visit at our office on 11/15/16.
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
## 2016-11-04 ENCOUNTER — Telehealth: Payer: Self-pay | Admitting: "Endocrinology

## 2016-11-04 ENCOUNTER — Other Ambulatory Visit: Payer: Self-pay

## 2016-11-04 DIAGNOSIS — K50119 Crohn's disease of large intestine with unspecified complications: Secondary | ICD-10-CM

## 2016-11-04 NOTE — Telephone Encounter (Signed)
Pt coming to office to pick up new accu-chek guide

## 2016-11-04 NOTE — Telephone Encounter (Signed)
Patient states that she lost her meter and needs a new one sent in to [facility] [address]

## 2016-11-05 ENCOUNTER — Telehealth: Payer: Self-pay | Admitting: Gastroenterology

## 2016-11-05 ENCOUNTER — Other Ambulatory Visit (HOSPITAL_COMMUNITY)
Admission: RE | Admit: 2016-11-05 | Discharge: 2016-11-05 | Disposition: A | Discharge status: Home / Self Care | Payer: Medicaid Other | Source: Ambulatory Visit | Attending: "Endocrinology | Admitting: "Endocrinology

## 2016-11-05 DIAGNOSIS — E1159 Type 2 diabetes mellitus with other circulatory complications: Secondary | ICD-10-CM | POA: Insufficient documentation

## 2016-11-05 DIAGNOSIS — E782 Mixed hyperlipidemia: Secondary | ICD-10-CM | POA: Insufficient documentation

## 2016-11-05 LAB — COMPREHENSIVE METABOLIC PANEL
ALBUMIN: 4.1 g/dL (ref 3.5–5.0)
ALK PHOS: 103 U/L (ref 38–126)
ALT: 37 U/L (ref 14–54)
ANION GAP: 10 (ref 5–15)
AST: 34 U/L (ref 15–41)
BUN: 15 mg/dL (ref 6–20)
CALCIUM: 9.8 mg/dL (ref 8.9–10.3)
CHLORIDE: 102 mmol/L (ref 101–111)
CO2: 25 mmol/L (ref 22–32)
Creatinine, Ser: 0.84 mg/dL (ref 0.44–1.00)
GFR calc Af Amer: >60 mL/min (ref >60)
GFR calc non Af Amer: >60 mL/min (ref >60)
GLUCOSE: 251 mg/dL — AB (ref 65–99)
Potassium: 4.3 mmol/L (ref 3.5–5.1)
SODIUM: 137 mmol/L (ref 135–145)
Total Bilirubin: 0.4 mg/dL (ref 0.3–1.2)
Total Protein: 7.5 g/dL (ref 6.5–8.1)

## 2016-11-05 LAB — LIPID PANEL
CHOL/HDL RATIO: 2.1 ratio
Cholesterol: 186 mg/dL (ref 0–200)
HDL: 87 mg/dL (ref >40)
LDL CALC: 73 mg/dL (ref 0–99)
TRIGLYCERIDES: 130 mg/dL (ref <150)
VLDL: 26 mg/dL (ref 0–40)

## 2016-11-05 LAB — T4, FREE: FREE T4: 0.68 ng/dL (ref 0.61–1.12)

## 2016-11-05 LAB — TSH: TSH: 0.308 u[IU]/mL — ABNORMAL LOW (ref 0.350–4.500)
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
## 2016-11-05 NOTE — Telephone Encounter (Signed)
Asked that patient wait until she has seen Dr. Adela Lank on 2/23. Explained that based on that appointment, he may want something different.

## 2016-11-06 ENCOUNTER — Ambulatory Visit: Payer: Medicaid Other | Admitting: Physician Assistant

## 2016-11-06 ENCOUNTER — Encounter: Payer: Self-pay | Admitting: Physician Assistant

## 2016-11-06 ENCOUNTER — Other Ambulatory Visit: Payer: Self-pay | Admitting: *Deleted

## 2016-11-06 VITALS — BP 130/66 | HR 60 | Temp 97.5°F | Ht 64.0 in | Wt 213.5 lb

## 2016-11-06 DIAGNOSIS — Z950 Presence of cardiac pacemaker: Secondary | ICD-10-CM

## 2016-11-06 DIAGNOSIS — IMO0002 Reserved for concepts with insufficient information to code with codable children: Secondary | ICD-10-CM

## 2016-11-06 DIAGNOSIS — E669 Obesity, unspecified: Secondary | ICD-10-CM

## 2016-11-06 DIAGNOSIS — Z6836 Body mass index (BMI) 36.0-36.9, adult: Secondary | ICD-10-CM

## 2016-11-06 DIAGNOSIS — I10 Essential (primary) hypertension: Secondary | ICD-10-CM

## 2016-11-06 DIAGNOSIS — K50919 Crohn's disease, unspecified, with unspecified complications: Secondary | ICD-10-CM

## 2016-11-06 DIAGNOSIS — E1142 Type 2 diabetes mellitus with diabetic polyneuropathy: Secondary | ICD-10-CM

## 2016-11-06 DIAGNOSIS — J449 Chronic obstructive pulmonary disease, unspecified: Secondary | ICD-10-CM

## 2016-11-06 DIAGNOSIS — K5900 Constipation, unspecified: Secondary | ICD-10-CM

## 2016-11-06 DIAGNOSIS — Z794 Long term (current) use of insulin: Secondary | ICD-10-CM

## 2016-11-06 DIAGNOSIS — E785 Hyperlipidemia, unspecified: Secondary | ICD-10-CM

## 2016-11-06 DIAGNOSIS — E1165 Type 2 diabetes mellitus with hyperglycemia: Secondary | ICD-10-CM

## 2016-11-06 DIAGNOSIS — F39 Unspecified mood [affective] disorder: Secondary | ICD-10-CM

## 2016-11-06 DIAGNOSIS — F17219 Nicotine dependence, cigarettes, with unspecified nicotine-induced disorders: Secondary | ICD-10-CM

## 2016-11-06 DIAGNOSIS — G8929 Other chronic pain: Secondary | ICD-10-CM

## 2016-11-06 LAB — MICROALBUMIN / CREATININE URINE RATIO: CREATININE, UR: 8.2 mg/dL

## 2016-11-06 LAB — HEMOGLOBIN A1C
HEMOGLOBIN A1C: 11.7 % — AB (ref 4.8–5.6)
Mean Plasma Glucose: 289 mg/dL

## 2016-11-06 LAB — VITAMIN D 25 HYDROXY (VIT D DEFICIENCY, FRACTURES): VIT D 25 HYDROXY: 17.8 ng/mL — AB (ref 30.0–100.0)

## 2016-11-06 MED ORDER — SIMVASTATIN 40 MG PO TABS: 40.0000 mg | ORAL_TABLET | Freq: Every evening | ORAL | 6 refills | Status: DC

## 2016-11-06 NOTE — Progress Notes (Signed)
BP 130/66 (BP Location: Left Arm, Patient Position: Sitting, Cuff Size: Normal)   Pulse 60   Temp 97.5 F (36.4 C) (Other (Comment))   Ht 5\' 4"  (1.626 m)   Wt 213 lb 8 oz (96.8 kg)   SpO2 98%   BMI 36.65 kg/m    Subjective:    Patient ID: [name], female    DOB: Jul 04, 1966, 51 y.o.   MRN: [id number]  HPI: SVEA PUSCH is a 51 y.o. female presenting on 11/06/2016 for Follow-up   HPI  Pt sees multiple specialists for multiple issues:  Endocrinologist - Dr Fransico Him- for diabetes Gastroenterologist - Dr Adela Lank- for crohns disease Neurologist - Dr Gerilyn Pilgrim- chronic pain and neuropathy Cardiology - Dr Diona Browner- for heart Cardiology - Dr [name]- for pacemaker Behavioral Health - Dr Neysa Hotter - for [facility] Pulmonology - Dr Sherene Sires- COPD- only sees prn  C/o constipation Pt is still smoking.  Breathing okay lately she says   Relevant past medical, surgical, family and social history reviewed and updated as indicated. Interim medical history since our last visit reviewed. Allergies and medications reviewed and updated.   Current Outpatient Prescriptions:  .  albuterol (PROVENTIL) (2.5 MG/3ML) 0.083% nebulizer solution, Take 3 mLs (2.5 mg total) by nebulization every 6 (six) hours as needed for wheezing or shortness of breath., Disp: 75 mL, Rfl: 12 .  aspirin EC 81 MG tablet, Take 81 mg by mouth daily., Disp: , Rfl:  .  canagliflozin (INVOKANA) 100 MG TABS tablet, Take 1 tablet (100 mg total) by mouth daily before breakfast., Disp: 30 tablet, Rfl: 2 .  carvedilol (COREG) 12.5 MG tablet, Take 1 tablet (12.5 mg total) by mouth 2 (two) times daily with a meal., Disp: 60 tablet, Rfl: 0 .  clopidogrel (PLAVIX) 75 MG tablet, TAKE 1 TABLET BY MOUTH EVERY DAY, Disp: 30 tablet, Rfl: 1 .  colestipol (COLESTID) 1 g tablet, Take 1 tablet (1 g total) by mouth 2 (two) times daily., Disp: 90 tablet, Rfl: 3 .  diclofenac sodium (VOLTAREN) 1 % GEL, Apply 4 g topically 4 (four) times daily., Disp:  200 g, Rfl: 11 .  FLUoxetine (PROZAC) 40 MG capsule, Take 1 capsule (40 mg total) by mouth daily., Disp: 30 capsule, Rfl: 1 .  furosemide (LASIX) 40 MG tablet, Take 1 tablet (40 mg total) by mouth every morning., Disp: 30 tablet, Rfl: 0 .  gemfibrozil (LOPID) 600 MG tablet, Take 1 tablet (600 mg total) by mouth 2 (two) times daily before a meal., Disp: 60 tablet, Rfl: 3 .  glucose blood (ACCU-CHEK GUIDE) test strip, Use as instructed 4 x daily. E11.65, Disp: 150 each, Rfl: 5 .  inFLIXimab (REMICADE) 100 MG injection, Inject 500 mg into the vein every 8 (eight) weeks. Needs loading dosage of 0,2,6 weeks then every 8 weeks thereafter, Disp: 1 each, Rfl: 3 .  insulin aspart (NOVOLOG FLEXPEN) 100 UNIT/ML FlexPen, Inject 15-21 Units into the skin 3 (three) times daily with meals., Disp: 5 pen, Rfl: 2 .  LEVEMIR FLEXTOUCH 100 UNIT/ML Pen, Inject 60 Units into the skin daily at 10 pm., Disp: 5 pen, Rfl: 2 .  losartan (COZAAR) 25 MG tablet, Take 1 tablet (25 mg total) by mouth daily., Disp: 30 tablet, Rfl: 3 .  LYRICA 75 MG capsule, Take 150 mg by mouth 3 (three) times daily. , Disp: , Rfl: 1 .  mesalamine (APRISO) 0.375 g 24 hr capsule, Take 4 capsules (1.5 g total) by mouth every morning., Disp: 120  capsule, Rfl: 1 .  metFORMIN (GLUCOPHAGE) 1000 MG tablet, TAKE 1 TABLET(1000 MG) BY MOUTH TWICE DAILY WITH A MEAL, Disp: 60 tablet, Rfl: 3 .  mirtazapine (REMERON) 7.5 MG tablet, Take 1 tablet (7.5 mg total) by mouth at bedtime., Disp: 30 tablet, Rfl: 1 .  nitroGLYCERIN (NITROSTAT) 0.4 MG SL tablet, Place 1 tablet (0.4 mg total) under the tongue every 5 (five) minutes as needed for chest pain., Disp: 30 tablet, Rfl: 0 .  NUCYNTA 75 MG tablet, Take 75 mg by mouth 3 (three) times daily as needed for pain., Disp: , Rfl: 0 .  OXYGEN, Inhale 3 L/day into the lungs at bedtime., Disp: , Rfl:  .  pantoprazole (PROTONIX) 40 MG tablet, Take 1 tablet (40 mg total) by mouth 2 (two) times daily., Disp: 90 tablet, Rfl: 3 .   predniSONE (DELTASONE) 5 MG tablet, Take 1 tablet (5 mg total) by mouth as directed. Decrease daily dosage by 5 mg on weekly basis, currently on 35 mg daily, Disp: 100 tablet, Rfl: 1 .  simvastatin (ZOCOR) 40 MG tablet, Take 1 tablet (40 mg total) by mouth every evening., Disp: 30 tablet, Rfl: 6 .  SYMBICORT 160-4.5 MCG/ACT inhaler, INHALE 2 PUFFS INTO THE LUNGS EVERY 12 HOURS, Disp: 10.2 g, Rfl: 0 .  tiZANidine (ZANAFLEX) 4 MG tablet, Take 4 mg by mouth 3 (three) times daily as needed for muscle spasms., Disp: , Rfl: 1 .  BD PEN NEEDLE NANO U/F 32G X 4 MM MISC, 1 each by Does not apply route 4 (four) times daily., Disp: 120 each, Rfl: 0   Review of Systems  Constitutional: Negative for appetite change, chills, diaphoresis, fatigue, fever and unexpected weight change.  HENT: Negative for congestion, dental problem, drooling, ear pain, facial swelling, hearing loss, mouth sores, sneezing, sore throat, trouble swallowing and voice change.   Eyes: Negative for pain, discharge, redness, itching and visual disturbance.  Respiratory: Positive for cough, shortness of breath and wheezing. Negative for choking.   Cardiovascular: Negative for chest pain, palpitations and leg swelling.  Gastrointestinal: Positive for constipation. Negative for abdominal pain, blood in stool, diarrhea and vomiting.  Endocrine: Positive for cold intolerance, heat intolerance and polydipsia.  Genitourinary: Negative for decreased urine volume, dysuria and hematuria.  Musculoskeletal: Positive for back pain and gait problem. Negative for arthralgias.  Skin: Negative for rash.  Allergic/Immunologic: Negative for environmental allergies.  Neurological: Positive for light-headedness. Negative for seizures, syncope and headaches.  Hematological: Negative for adenopathy.  Psychiatric/Behavioral: Positive for dysphoric mood. Negative for agitation and suicidal ideas. The patient is nervous/anxious.     Per HPI unless specifically  indicated above     Objective:    BP 130/66 (BP Location: Left Arm, Patient Position: Sitting, Cuff Size: Normal)   Pulse 60   Temp 97.5 F (36.4 C) (Other (Comment))   Ht 5\' 4"  (1.626 m)   Wt 213 lb 8 oz (96.8 kg)   SpO2 98%   BMI 36.65 kg/m   Wt Readings from Last 3 Encounters:  11/06/16 213 lb 8 oz (96.8 kg)  10/31/16 212 lb (96.2 kg)  09/27/16 214 lb (97.1 kg)    Physical Exam  Constitutional: She is oriented to person, place, and time. She appears well-developed and well-nourished.  HENT:  Head: Normocephalic and atraumatic.  Neck: Neck supple.  Cardiovascular: Normal rate and regular rhythm.   Pulmonary/Chest: Effort normal and breath sounds normal.  Abdominal: Soft. Bowel sounds are normal. She exhibits no mass. There is no  hepatosplenomegaly. There is no tenderness.  Musculoskeletal: She exhibits no edema.  Lymphadenopathy:    She has no cervical adenopathy.  Neurological: She is alert and oriented to person, place, and time.  Skin: Skin is warm and dry.  Psychiatric: She has a normal mood and affect. Her behavior is normal.  Vitals reviewed.   Results for orders placed or performed during the hospital encounter of 11/05/16  Comprehensive metabolic panel  Result Value Ref Range   Sodium 137 135 - 145 mmol/L   Potassium 4.3 3.5 - 5.1 mmol/L   Chloride 102 101 - 111 mmol/L   CO2 25 22 - 32 mmol/L   Glucose, Bld 251 (H) 65 - 99 mg/dL   BUN 15 6 - 20 mg/dL   Creatinine, Ser 9.32 0.44 - 1.00 mg/dL   Calcium 9.8 8.9 - 35.5 mg/dL   Total Protein 7.5 6.5 - 8.1 g/dL   Albumin 4.1 3.5 - 5.0 g/dL   AST 34 15 - 41 U/L   ALT 37 14 - 54 U/L   Alkaline Phosphatase 103 38 - 126 U/L   Total Bilirubin 0.4 0.3 - 1.2 mg/dL   GFR calc non Af Amer >60 >60 mL/min   GFR calc Af Amer >60 >60 mL/min   Anion gap 10 5 - 15  Hemoglobin A1c  Result Value Ref Range   Hgb A1c MFr Bld 11.7 (H) 4.8 - 5.6 %   Mean Plasma Glucose 289 mg/dL  Lipid panel  Result Value Ref Range    Cholesterol 186 0 - 200 mg/dL   Triglycerides 732 <202 mg/dL   HDL 87 >54 mg/dL   Total CHOL/HDL Ratio 2.1 RATIO   VLDL 26 0 - 40 mg/dL   LDL Cholesterol 73 0 - 99 mg/dL  T4, free  Result Value Ref Range   Free T4 0.68 0.61 - 1.12 ng/dL  TSH  Result Value Ref Range   TSH 0.308 (L) 0.350 - 4.500 uIU/mL  VITAMIN D 25 Hydroxy (Vit-D Deficiency, Fractures)  Result Value Ref Range   Vit D, 25-Hydroxy 17.8 (L) 30.0 - 100.0 ng/mL  Microalbumin / creatinine urine ratio  Result Value Ref Range   Microalb, Ur <3.0 (H) Not Estab. ug/mL   Microalb Creat Ratio Comment (A) 0.0 - 30.0 mg/g creat   Creatinine, Urine 8.2 Not Estab. mg/dL      Assessment & Plan:   Encounter Diagnoses  Name Primary?  . Chronic obstructive pulmonary disease, unspecified COPD type (HCC) Yes  . Cigarette nicotine dependence with nicotine-induced disorder   . Uncontrolled type 2 diabetes mellitus with diabetic polyneuropathy, with long-term current use of insulin (HCC)   . Hyperlipidemia, unspecified hyperlipidemia type   . Class 2 obesity with body mass index (BMI) of 36.0 to 36.9 in adult, unspecified obesity type, unspecified whether serious comorbidity present   . Essential hypertension   . Mood disorder (HCC)   . Other chronic pain   . Cardiac pacemaker in situ   . Diabetic polyneuropathy associated with type 2 diabetes mellitus (HCC)   . Crohn's disease with complication, unspecified gastrointestinal tract location (HCC)   . Constipation, unspecified constipation type     -reviewed labs with pt -pt to continue with specialists for her mutliple issues -pt has not had her nocturnal oximetry.  Nurse notified dr Ronal Fear office and they will address at appointment on 11/13/16 -pt counseled on smoking cessation -pt counseled to increase water and fiber in her diet to help her constipation. She is given  reading information on constipation.  She should address with her gastroenterologist if persists -follow up  3 months.  RTO sooner prn

## 2016-11-06 NOTE — Patient Instructions (Signed)

## 2016-11-07 ENCOUNTER — Telehealth (HOSPITAL_COMMUNITY): Payer: Self-pay | Admitting: *Deleted

## 2016-11-07 NOTE — Telephone Encounter (Signed)
voice message from patient to cancel appointment for 11/08/16, due to her health.   she will call back when she can reschedule.

## 2016-11-08 ENCOUNTER — Ambulatory Visit (HOSPITAL_COMMUNITY): Payer: Self-pay | Admitting: Psychiatry

## 2016-11-13 ENCOUNTER — Other Ambulatory Visit (HOSPITAL_COMMUNITY): Payer: Self-pay | Admitting: *Deleted

## 2016-11-14 ENCOUNTER — Ambulatory Visit (INDEPENDENT_AMBULATORY_CARE_PROVIDER_SITE_OTHER): Payer: Medicaid Other | Admitting: Internal Medicine

## 2016-11-14 ENCOUNTER — Telehealth: Payer: Self-pay | Admitting: Internal Medicine

## 2016-11-14 ENCOUNTER — Encounter: Payer: Self-pay | Admitting: Internal Medicine

## 2016-11-14 ENCOUNTER — Ambulatory Visit (HOSPITAL_COMMUNITY)
Admission: RE | Admit: 2016-11-14 | Discharge: 2016-11-14 | Disposition: A | Discharge status: Home / Self Care | Payer: Medicaid Other | Source: Ambulatory Visit | Attending: Gastroenterology | Admitting: Gastroenterology

## 2016-11-14 VITALS — BP 126/78 | HR 87 | Ht 64.0 in | Wt 217.0 lb

## 2016-11-14 DIAGNOSIS — I495 Sick sinus syndrome: Secondary | ICD-10-CM

## 2016-11-14 DIAGNOSIS — K50119 Crohn's disease of large intestine with unspecified complications: Secondary | ICD-10-CM

## 2016-11-14 DIAGNOSIS — K5010 Crohn's disease of large intestine without complications: Secondary | ICD-10-CM | POA: Insufficient documentation

## 2016-11-14 LAB — CUP PACEART INCLINIC DEVICE CHECK
Brady Statistic RA Percent Paced: 72 %
Brady Statistic RV Percent Paced: 0 %
Implantable Lead Implant Date: 20090123
Implantable Lead Implant Date: 20090123
Implantable Lead Location: 753860
Implantable Lead Model: 5076
Lead Channel Impedance Value: 340 Ohm
Lead Channel Pacing Threshold Amplitude: 0.6 V
Lead Channel Pacing Threshold Amplitude: 0.7 V
Lead Channel Pacing Threshold Pulse Width: 0.4 ms
Lead Channel Sensing Intrinsic Amplitude: 1.5 mV
Lead Channel Sensing Intrinsic Amplitude: 8.4 mV
Lead Channel Setting Pacing Amplitude: 2 V
Lead Channel Setting Pacing Pulse Width: 0.4 ms
Lead Channel Setting Sensing Sensitivity: 2.5 mV
MDC IDC LEAD LOCATION: 753859
MDC IDC MSMT LEADCHNL RV IMPEDANCE VALUE: 440 Ohm
MDC IDC MSMT LEADCHNL RV PACING THRESHOLD PULSEWIDTH: 0.4 ms
MDC IDC PG IMPLANT DT: 20090123
MDC IDC SESS DTM: 20180222050000
MDC IDC SET LEADCHNL RA PACING AMPLITUDE: 2 V
Pulse Gen Serial Number: 573704

## 2016-11-14 MED ORDER — DIPHENHYDRAMINE HCL 25 MG PO CAPS
ORAL_CAPSULE | ORAL | Status: AC
  Administered 2016-11-14: 11:00:00 50 mg
  Filled 2016-11-14: qty 2

## 2016-11-14 MED ORDER — ACETAMINOPHEN 325 MG PO TABS: 650.0000 mg | ORAL_TABLET | Freq: Once | ORAL | Status: DC

## 2016-11-14 MED ORDER — SODIUM CHLORIDE 0.9 % IV SOLN
5.0000 mg/kg | Freq: Once | INTRAVENOUS | Status: AC
  Administered 2016-11-14: 500 mg via INTRAVENOUS
  Filled 2016-11-14: qty 50

## 2016-11-14 MED ORDER — SODIUM CHLORIDE 0.9 % IV SOLN
Freq: Once | INTRAVENOUS | Status: AC
  Administered 2016-11-14: 11:00:00 via INTRAVENOUS

## 2016-11-14 MED ORDER — DIPHENHYDRAMINE HCL 25 MG PO CAPS: 50.0000 mg | ORAL_CAPSULE | Freq: Once | ORAL | Status: DC

## 2016-11-14 MED ORDER — ACETAMINOPHEN 325 MG PO TABS
ORAL_TABLET | ORAL | Status: AC
  Administered 2016-11-14: 11:00:00 650 mg
  Filled 2016-11-14: qty 2

## 2016-11-14 NOTE — Telephone Encounter (Signed)
Pt called to reports dizziness and diaphoresis occurring after she arrived home today from her 2nd remicade infusion. Symptoms transient and resolved by the time I called her back At time of my call she was in usual state of health without complaint Got premeds  Plan: She will call back with any additional problems tonight Office to check on her tomorrow

## 2016-11-14 NOTE — Patient Instructions (Signed)
Your physician wants you to follow-up in: 1 Year with Dr. Taylor.  You will receive a reminder letter in the mail two months in advance. If you don't receive a letter, please call our office to schedule the follow-up appointment.  Your physician recommends that you schedule a follow-up appointment in: 3 Months with the [facility].   Your physician recommends that you continue on your current medications as directed. Please refer to the Current Medication list given to you today.  If you need a refill on your cardiac medications before your next appointment, please call your pharmacy.  Thank you for choosing [facility]!   

## 2016-11-14 NOTE — Progress Notes (Signed)
Patient reports that she vomited when she got home after first dose of Remicade. Denies any rash, itching, new wheezing. Dr. Adela Lank notified. Ok to proceed as ordered with today's dose. Patient aware and verbalizes understanding.

## 2016-11-14 NOTE — Progress Notes (Signed)
HPI Mrs. Waren returns today after a long absence from our EP clinic. She is a pleasant 51 yo woman with a h/o symptomatic bradycardia, s/p PPM, HTN, obesity, and syncope. In the interim, she has been stable except she has been diagnosed with Crohn disease. She had lost weight but has gained it back. No chest pain. She suffers from neuropathy. Allergies  Allergen Reactions  . Flexeril [Cyclobenzaprine] Hives  . Amoxicillin Hives and Rash    Has patient had a PCN reaction causing immediate rash, facial/tongue/throat swelling, SOB or lightheadedness with hypotension: Yes Has patient had a PCN reaction causing severe rash involving mucus membranes or skin necrosis: Yes Has patient had a PCN reaction that required hospitalization No Has patient had a PCN reaction occurring within the last 10 years: Yes If all of the above answers are "NO", then may proceed with Cephalosporin use.      Current Outpatient Prescriptions  Medication Sig Dispense Refill  . albuterol (PROVENTIL) (2.5 MG/3ML) 0.083% nebulizer solution Take 3 mLs (2.5 mg total) by nebulization every 6 (six) hours as needed for wheezing or shortness of breath. 75 mL 12  . aspirin EC 81 MG tablet Take 81 mg by mouth daily.    . BD PEN NEEDLE NANO U/F 32G X 4 MM MISC 1 each by Does not apply route 4 (four) times daily. 120 each 0  . canagliflozin (INVOKANA) 100 MG TABS tablet Take 1 tablet (100 mg total) by mouth daily before breakfast. 30 tablet 2  . carvedilol (COREG) 12.5 MG tablet Take 1 tablet (12.5 mg total) by mouth 2 (two) times daily with a meal. 60 tablet 0  . clopidogrel (PLAVIX) 75 MG tablet TAKE 1 TABLET BY MOUTH EVERY DAY 30 tablet 1  . colestipol (COLESTID) 1 g tablet Take 1 tablet (1 g total) by mouth 2 (two) times daily. 90 tablet 3  . diclofenac sodium (VOLTAREN) 1 % GEL Apply 4 g topically 4 (four) times daily. 200 g 11  . FLUoxetine (PROZAC) 40 MG capsule Take 1 capsule (40 mg total) by mouth daily. 30  capsule 1  . furosemide (LASIX) 40 MG tablet Take 1 tablet (40 mg total) by mouth every morning. 30 tablet 0  . gemfibrozil (LOPID) 600 MG tablet Take 1 tablet (600 mg total) by mouth 2 (two) times daily before a meal. 60 tablet 3  . glucose blood (ACCU-CHEK GUIDE) test strip Use as instructed 4 x daily. E11.65 150 each 5  . inFLIXimab (REMICADE) 100 MG injection Inject 500 mg into the vein every 8 (eight) weeks. Needs loading dosage of 0,2,6 weeks then every 8 weeks thereafter 1 each 3  . insulin aspart (NOVOLOG FLEXPEN) 100 UNIT/ML FlexPen Inject 15-21 Units into the skin 3 (three) times daily with meals. 5 pen 2  . LEVEMIR FLEXTOUCH 100 UNIT/ML Pen Inject 60 Units into the skin daily at 10 pm. 5 pen 2  . losartan (COZAAR) 25 MG tablet Take 1 tablet (25 mg total) by mouth daily. 30 tablet 3  . LYRICA 75 MG capsule Take 150 mg by mouth 3 (three) times daily.   1  . mesalamine (APRISO) 0.375 g 24 hr capsule Take 4 capsules (1.5 g total) by mouth every morning. 120 capsule 1  . metFORMIN (GLUCOPHAGE) 1000 MG tablet TAKE 1 TABLET(1000 MG) BY MOUTH TWICE DAILY WITH A MEAL 60 tablet 3  . mirtazapine (REMERON) 7.5 MG tablet Take 1 tablet (7.5 mg total) by mouth at bedtime.  30 tablet 1  . nitroGLYCERIN (NITROSTAT) 0.4 MG SL tablet Place 1 tablet (0.4 mg total) under the tongue every 5 (five) minutes as needed for chest pain. 30 tablet 0  . NUCYNTA 75 MG tablet Take 75 mg by mouth 3 (three) times daily as needed for pain.  0  . OXYGEN Inhale 3 L/day into the lungs at bedtime.    . pantoprazole (PROTONIX) 40 MG tablet Take 1 tablet (40 mg total) by mouth 2 (two) times daily. 90 tablet 3  . predniSONE (DELTASONE) 5 MG tablet Take 1 tablet (5 mg total) by mouth as directed. Decrease daily dosage by 5 mg on weekly basis, currently on 35 mg daily 100 tablet 1  . simvastatin (ZOCOR) 40 MG tablet Take 1 tablet (40 mg total) by mouth every evening. 30 tablet 6  . SYMBICORT 160-4.5 MCG/ACT inhaler INHALE 2 PUFFS  INTO THE LUNGS EVERY 12 HOURS 10.2 g 0  . tiZANidine (ZANAFLEX) 4 MG tablet Take 4 mg by mouth 3 (three) times daily as needed for muscle spasms.  1   No current facility-administered medications for this visit.      Past Medical History:  Diagnosis Date  . Anxiety   . Arthritis   . Cardiac pacemaker in situ 2009   DDD [employer] -- [place] 60  . COPD with asthma (HCC)    GOLD 2-3 --  pulmologist (last visit 2011) Dr. Marchelle Gearing  . Crohn's disease (HCC)    Large intestine  . Depression 2016   PTSD  . Emphysema lung (HCC)   . Essential hypertension   . Family history of adverse reaction to anesthesia    "father would get sick"  . Gastroparesis   . GERD (gastroesophageal reflux disease)   . History of hiatal hernia   . History of kidney stones   . History of stroke    Jun 2011 -- right hand weakness  . History of syncope   . Inappropriate sinus tachycardia    Sinus node modification 02-25-2003 by Dr. Lewayne Bunting  . LLQ abdominal tenderness 10/20/2015  . OSA (obstructive sleep apnea)    Study done 2005 -- pt refused CPAP/previously was using nocturnal oxygen until one year ago pt states PCP is monitoring pt without  . Pelvic pain in female   . PTSD (post-traumatic stress disorder)   . Sinus node dysfunction (HCC)    Symptomatic bradycardia  . Stroke (HCC)   . Symptomatic sinus bradycardia   . Type 2 diabetes mellitus (HCC)   . Wears glasses     ROS:   All systems reviewed and negative except as noted in the HPI.   Past Surgical History:  Procedure Laterality Date  . ABDOMINAL HYSTERECTOMY    . BILATERAL KNEE ARTHROSCOPY W/ CHONDROMALACIA PATELLA  bilateral ---- 12-26-2010; 08-14-2009;  04-25-2008;  03-09-2007   additional same surgery, Left knee 2005;  x2 2006 ---  Right knee 2005;  2006;  x2  2007  . CARDIAC CATHETERIZATION  05-08-2001  dr Nicki Guadalaj[place]   normal coronary arteries and LVF  . CARDIAC ELECTROPHYSIOLOGY STUDY W/  SINUS NODE MODIFICATION   02-25-2003  dr Sharlot Gowda Imunique Samad  . CARDIAC PACEMAKER PLACEMENT  10-16-2007  dr Lewayne Bunting   DDD-- [facility] 60  . CARDIOVASCULAR STRESS TEST  08-02-2010  dr Diona Browner   normal lexiscan study/  ef 59%  . CESAREAN SECTION  x2  . CHOLECYSTECTOMY  1994  . COLONOSCOPY WITH PROPOFOL N/A 09/27/2016   Procedure: COLONOSCOPY  WITH PROPOFOL;  Surgeon: Ruffin Frederick, MD;  Location: [facility];  Service: Gastroenterology;  Laterality: N/A;  . CYSTO/  URETEROSCOPIC STONE EXTRACTION  03/ 2005  . CYSTOSCOPY WITH HYDRODISTENSION AND BIOPSY N/A 05/23/2015   Procedure: CYSTOSCOPY/BIOPSY/HYDRODISTENSION;  Surgeon: Su Grand, MD;  Location: [facility];  Service: Urology;  Laterality: N/A;  . ESOPHAGOGASTRODUODENOSCOPY (EGD) WITH PROPOFOL N/A 09/27/2016   Procedure: ESOPHAGOGASTRODUODENOSCOPY (EGD) WITH PROPOFOL;  Surgeon: Ruffin Frederick, MD;  Location: [facility];  Service: Gastroenterology;  Laterality: N/A;  . TONSILLECTOMY  11-25-2002     Family History  Problem Relation Age of Onset  . Diabetes Mother   . Asthma Mother   . Heart disease Mother   . Cirrhosis Mother   . Stroke Mother   . Heart disease Father     Deceased. MI. Mother, 2 brothers, sister, nephew also have heart disease  . Emphysema Father     Died of it. Was pt of Dr. Sherene Sires   . Heart attack Father   . Cancer Father     ? type  . Diabetes Brother   . Heart disease Brother   . Diabetes Sister   . Cirrhosis Sister   . Cirrhosis Maternal Grandmother   . Diabetes Brother   . Heart attack Brother   . Heart disease Brother   . Seizures Brother   . Colon cancer Neg Hx   . Stomach cancer Neg Hx   . Esophageal cancer Neg Hx   . Rectal cancer Neg Hx   . Liver cancer Neg Hx      Social History   Social History  . Marital status: Divorced    Spouse name: N/A  . Number of children: 2  . Years of education: N/A   Occupational History  . disabled    Social History Main Topics  .  Smoking status: Current Every Day Smoker    Packs/day: 2.00    Years: 42.00    Types: Cigarettes    Start date: 02/02/1973  . Smokeless tobacco: Never Used     Comment: smoke about 2 packs because I recently lost my mom.  . Alcohol use No     Comment: 08-29-2016 per pt Occas.  on special occasions  . Drug use: No     Comment: 08-29-2016 per pt no  . Sexual activity: Not Currently    Birth control/ protection: Post-menopausal   Other Topics Concern  . Not on file   Social History Narrative   Married, 2 boys. 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      BP 126/78   Pulse 87   Ht 5\' 4"  (1.626 m)   Wt 217 lb (98.4 kg)   SpO2 96%   BMI 37.25 kg/m   Physical Exam:  Well appearing middle aged woman, NAD HEENT: Unremarkable Neck:  7 cm JVD, no thyromegally Lymphatics:  No adenopathy Back:  No CVA tenderness Lungs:  Clear with no wheezes, well healed PM incision. HEART:  Regular rate rhythm, no murmurs, no rubs, no clicks Abd:  soft, positive bowel sounds, no organomegally, no rebound, no guarding Ext:  2 plus pulses, no edema, no cyanosis, no clubbing Skin:  No rashes no nodules Neuro:  CN II through XII intact, motor grossly intact  DEVICE  Normal device function.  See PaceArt for details.   Assess/Plan: 1. Sinus node dysfunction - she is asymptomatic, s/p PPM insertion 2. PPM - her [facility] [facility] DDD PM is about a  year from ERI. 3. HTN - her blood pressure is well controlled. She will continue her current meds. 4. Obesity - her weight was down to 180 and now back to over 210. She thinks she has too much fluid but I have asked her to increase her physical activity.   [name].D.

## 2016-11-15 ENCOUNTER — Other Ambulatory Visit: Payer: Self-pay

## 2016-11-15 ENCOUNTER — Encounter: Payer: Self-pay | Admitting: Cardiology

## 2016-11-15 ENCOUNTER — Ambulatory Visit: Payer: Self-pay | Admitting: Gastroenterology

## 2016-11-15 NOTE — Telephone Encounter (Signed)
Okay thanks for the update. Unclear why she had vomiting again after the infusion. Perhaps we should order some zofran for her prior for her next infusion. She should have basic labs sent now that she is on Remicade. Has she been tapering down her steroids as recommended? If she has any symptoms in the interim prior to her next visit she can contact us. Thanks

## 2016-11-15 NOTE — Telephone Encounter (Signed)
Spoke to patient this morning, at first she said that she was sick and could not make her appointment for follow up today and had been trying to call our office to cancel appointment. She said that she got dizzy, diaphoretic and had one episode of vomiting when she got home from infusion. This was the same as what she experienced last time. Asked her again how she was feeling and she did confirm that she was feeling okay today, able to eat and drink, denies any dizziness or vomiting since yesterday.  After talking to her for a minute, she stated that her vehicle broke down and she could not make it to appointment today. She is currently rescheduled to first available on 3/29. She was supposed to get labs done today too.

## 2016-11-15 NOTE — Addendum Note (Signed)
Addended by: Leverne Humbles on: 11/15/2016 03:45 PM   Modules accepted: Orders

## 2016-11-18 ENCOUNTER — Telehealth: Payer: Self-pay

## 2016-11-18 ENCOUNTER — Other Ambulatory Visit: Payer: Self-pay

## 2016-11-18 ENCOUNTER — Other Ambulatory Visit: Payer: Self-pay | Admitting: Physician Assistant

## 2016-11-18 DIAGNOSIS — K580 Irritable bowel syndrome with diarrhea: Secondary | ICD-10-CM

## 2016-11-18 NOTE — Telephone Encounter (Signed)
-----  Message from Manus Gunning, MD sent at 11/15/2016  5:50 PM EST ----- Hi there, CBC, ESR, CRP. Thanks much! ----- Message ----- From: Doristine Counter, RN Sent: 11/15/2016   3:48 PM To: Manus Gunning, MD  Besides LFT's, what other tests would you like, if any? Thanks, Almyra Free

## 2016-11-18 NOTE — Telephone Encounter (Signed)
Spoke to patient, she will go to [facility] to have labs drawn.

## 2016-11-19 ENCOUNTER — Encounter: Payer: Self-pay | Admitting: "Endocrinology

## 2016-11-19 ENCOUNTER — Other Ambulatory Visit: Payer: Self-pay | Admitting: "Endocrinology

## 2016-11-19 ENCOUNTER — Ambulatory Visit (INDEPENDENT_AMBULATORY_CARE_PROVIDER_SITE_OTHER): Payer: Medicaid Other | Admitting: "Endocrinology

## 2016-11-19 VITALS — BP 109/78 | HR 63 | Ht 64.0 in | Wt 224.0 lb

## 2016-11-19 DIAGNOSIS — I10 Essential (primary) hypertension: Secondary | ICD-10-CM

## 2016-11-19 DIAGNOSIS — E559 Vitamin D deficiency, unspecified: Secondary | ICD-10-CM | POA: Diagnosis not present

## 2016-11-19 DIAGNOSIS — E1159 Type 2 diabetes mellitus with other circulatory complications: Secondary | ICD-10-CM

## 2016-11-19 DIAGNOSIS — E782 Mixed hyperlipidemia: Secondary | ICD-10-CM

## 2016-11-19 MED ORDER — VITAMIN D3 125 MCG (5000 UT) PO CAPS: 5000.0000 [IU] | ORAL_CAPSULE | Freq: Every day | ORAL | 0 refills | Status: AC

## 2016-11-19 MED ORDER — LEVEMIR FLEXTOUCH 100 UNIT/ML ~~LOC~~ SOPN: 70.0000 [IU] | PEN_INJECTOR | Freq: Every day | SUBCUTANEOUS | 2 refills | Status: DC

## 2016-11-19 NOTE — Patient Instructions (Signed)

## 2016-11-19 NOTE — Progress Notes (Signed)
Subjective:    Patient ID: [name], female    DOB: 03-06-1970. Patient is being seen in f/u for management of diabetes requested by  Jacquelin Hawking, PA-C  Past Medical History:  Diagnosis Date  . Anxiety   . Arthritis   . Cardiac pacemaker in situ 2009   DDD [employer] -- [place] 60  . COPD with asthma (HCC)    GOLD 2-3 --  pulmologist (last visit 2011) Dr. Marchelle Gearing  . Crohn's disease (HCC)    Large intestine  . Depression 2016   PTSD  . Emphysema lung (HCC)   . Essential hypertension   . Family history of adverse reaction to anesthesia    "father would get sick"  . Gastroparesis   . GERD (gastroesophageal reflux disease)   . History of hiatal hernia   . History of kidney stones   . History of stroke    Jun 2011 -- right hand weakness  . History of syncope   . Inappropriate sinus tachycardia    Sinus node modification 02-25-2003 by Dr. Lewayne Bunting  . LLQ abdominal tenderness 10/20/2015  . OSA (obstructive sleep apnea)    Study done 2005 -- pt refused CPAP/previously was using nocturnal oxygen until one year ago pt states PCP is monitoring pt without  . Pelvic pain in female   . PTSD (post-traumatic stress disorder)   . Sinus node dysfunction (HCC)    Symptomatic bradycardia  . Stroke (HCC)   . Symptomatic sinus bradycardia   . Type 2 diabetes mellitus (HCC)   . Wears glasses    Past Surgical History:  Procedure Laterality Date  . ABDOMINAL HYSTERECTOMY    . BILATERAL KNEE ARTHROSCOPY W/ CHONDROMALACIA PATELLA  bilateral ---- 12-26-2010; 08-14-2009;  04-25-2008;  03-09-2007   additional same surgery, Left knee 2005;  x2 2006 ---  Right knee 2005;  2006;  x2  2007  . CARDIAC CATHETERIZATION  05-08-2001  dr Nicki Guadalajara   normal coronary arteries and LVF  . CARDIAC ELECTROPHYSIOLOGY STUDY W/  SINUS NODE MODIFICATION  02-25-2003  dr Sharlot Gowda taylor  . CARDIAC PACEMAKER PLACEMENT  10-16-2007  dr Lewayne Bunting   DDD-- [facility] 60  .  CARDIOVASCULAR STRESS TEST  08-02-2010  dr Diona Browner   normal lexiscan study/  ef 59%  . CESAREAN SECTION  x2  . CHOLECYSTECTOMY  1994  . COLONOSCOPY WITH PROPOFOL N/A 09/27/2016   Procedure: COLONOSCOPY WITH PROPOFOL;  Surgeon: Ruffin Frederick, MD;  Location: [facility];  Service: Gastroenterology;  Laterality: N/A;  . CYSTO/  URETEROSCOPIC STONE EXTRACTION  03/ 2005  . CYSTOSCOPY WITH HYDRODISTENSION AND BIOPSY N/A 05/23/2015   Procedure: CYSTOSCOPY/BIOPSY/HYDRODISTENSION;  Surgeon: Su Grand, MD;  [facility];  Service: Urology;  Laterality: N/A;  . ESOPHAGOGASTRODUODENOSCOPY (EGD) WITH PROPOFOL N/A 09/27/2016   Procedure: ESOPHAGOGASTRODUODENOSCOPY (EGD) WITH PROPOFOL;  Surgeon: Ruffin Frederick, MD;  Location: [facility];  Service: Gastroenterology;  Laterality: N/A;  . TONSILLECTOMY  11-25-2002   Social History   Social History  . Marital status: Divorced    Spouse name: N/A  . Number of children: 2  . Years of education: N/A   Occupational History  . disabled    Social History Main Topics  . Smoking status: Current Every Day Smoker    Packs/day: 2.00    Years: 42.00    Types: Cigarettes    Start date: 02/02/1973  . Smokeless tobacco: Never Used     Comment: smoke about  2 packs because I recently lost my mom.  . Alcohol use No     Comment: 08-29-2016 per pt Occas.  on special occasions  . Drug use: No     Comment: 08-29-2016 per pt no  . Sexual activity: Not Currently    Birth control/ protection: Post-menopausal   Other Topics Concern  . None   Social History Narrative   Married, 2 boys. 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    Outpatient Encounter Prescriptions as of 11/19/2016  Medication Sig  . albuterol (PROVENTIL) (2.5 MG/3ML) 0.083% nebulizer solution Take 3 mLs (2.5 mg total) by nebulization every 6 (six) hours as needed for wheezing or shortness of breath.  Marland Kitchen aspirin EC 81 MG tablet Take 81 mg by  mouth daily.  . BD PEN NEEDLE NANO U/F 32G X 4 MM MISC 1 each by Does not apply route 4 (four) times daily.  . carvedilol (COREG) 12.5 MG tablet Take 1 tablet (12.5 mg total) by mouth 2 (two) times daily with a meal.  . Cholecalciferol (VITAMIN D3) 5000 units CAPS Take 1 capsule (5,000 Units total) by mouth daily.  . clopidogrel (PLAVIX) 75 MG tablet TAKE 1 TABLET BY MOUTH EVERY DAY  . colestipol (COLESTID) 1 g tablet Take 1 tablet (1 g total) by mouth 2 (two) times daily.  . diclofenac sodium (VOLTAREN) 1 % GEL Apply 4 g topically 4 (four) times daily.  Marland Kitchen FLUoxetine (PROZAC) 40 MG capsule Take 1 capsule (40 mg total) by mouth daily.  . furosemide (LASIX) 40 MG tablet Take 1 tablet (40 mg total) by mouth every morning.  Marland Kitchen gemfibrozil (LOPID) 600 MG tablet Take 1 tablet (600 mg total) by mouth 2 (two) times daily before a meal.  . glucose blood (ACCU-CHEK GUIDE) test strip Use as instructed 4 x daily. E11.65  . inFLIXimab (REMICADE) 100 MG injection Inject 500 mg into the vein every 8 (eight) weeks. Needs loading dosage of 0,2,6 weeks then every 8 weeks thereafter  . insulin aspart (NOVOLOG FLEXPEN) 100 UNIT/ML FlexPen Inject 15-21 Units into the skin 3 (three) times daily with meals.  Marland Kitchen LEVEMIR FLEXTOUCH 100 UNIT/ML Pen Inject 70 Units into the skin daily at 10 pm.  . losartan (COZAAR) 25 MG tablet Take 1 tablet (25 mg total) by mouth daily.  Marland Kitchen LYRICA 75 MG capsule Take 150 mg by mouth 3 (three) times daily.   . mesalamine (APRISO) 0.375 g 24 hr capsule Take 4 capsules (1.5 g total) by mouth every morning.  . metFORMIN (GLUCOPHAGE) 1000 MG tablet TAKE 1 TABLET(1000 MG) BY MOUTH TWICE DAILY WITH A MEAL  . mirtazapine (REMERON) 7.5 MG tablet Take 1 tablet (7.5 mg total) by mouth at bedtime.  . nitroGLYCERIN (NITROSTAT) 0.4 MG SL tablet Place 1 tablet (0.4 mg total) under the tongue every 5 (five) minutes as needed for chest pain.  . NUCYNTA 75 MG tablet Take 75 mg by mouth 3 (three) times daily as  needed for pain.  . OXYGEN Inhale 3 L/day into the lungs at bedtime.  . pantoprazole (PROTONIX) 40 MG tablet Take 1 tablet (40 mg total) by mouth 2 (two) times daily.  . predniSONE (DELTASONE) 5 MG tablet Take 1 tablet (5 mg total) by mouth as directed. Decrease daily dosage by 5 mg on weekly basis, currently on 35 mg daily  . simvastatin (ZOCOR) 40 MG tablet Take 1 tablet (40 mg total) by mouth every evening.  . SYMBICORT 160-4.5 MCG/ACT inhaler INHALE 2  PUFFS INTO THE LUNGS EVERY 12 HOURS  . tiZANidine (ZANAFLEX) 4 MG tablet Take 4 mg by mouth 3 (three) times daily as needed for muscle spasms.  . [DISCONTINUED] canagliflozin (INVOKANA) 100 MG TABS tablet Take 1 tablet (100 mg total) by mouth daily before breakfast.  . [DISCONTINUED] LEVEMIR FLEXTOUCH 100 UNIT/ML Pen Inject 60 Units into the skin daily at 10 pm.   No facility-administered encounter medications on file as of 11/19/2016.    ALLERGIES: Allergies  Allergen Reactions  . Flexeril [Cyclobenzaprine] Hives  . Amoxicillin Hives and Rash    Has patient had a PCN reaction causing immediate rash, facial/tongue/throat swelling, SOB or lightheadedness with hypotension: Yes Has patient had a PCN reaction causing severe rash involving mucus membranes or skin necrosis: Yes Has patient had a PCN reaction that required hospitalization No Has patient had a PCN reaction occurring within the last 10 years: Yes If all of the above answers are "NO", then may proceed with Cephalosporin use.    VACCINATION STATUS: Immunization History  Administered Date(s) Administered  . Influenza Whole 06/23/2009  . Influenza,inj,Quad PF,36+ Mos 06/30/2015, 08/01/2016  . Pneumococcal Conjugate-13 08/30/2015  . Pneumococcal Polysaccharide-23 11/13/2009    Diabetes  She presents for her follow-up diabetic visit. She has type 2 diabetes mellitus. Onset time: diagnosed at age 72 after an episode of GDM. Her disease course has been worsening. There are no  hypoglycemic associated symptoms. Pertinent negatives for hypoglycemia include no confusion, headaches, pallor or seizures. Associated symptoms include blurred vision, fatigue, foot ulcerations, polydipsia and polyuria. Pertinent negatives for diabetes include no chest pain and no polyphagia. There are no hypoglycemic complications. Symptoms are worsening. Diabetic complications include a CVA and peripheral neuropathy. Risk factors for coronary artery disease include diabetes mellitus, dyslipidemia, family history, hypertension, obesity, sedentary lifestyle and tobacco exposure. Her weight is increasing steadily. She is following a generally unhealthy diet. When asked about meal planning, she reported none. She has not had a previous visit with a dietitian. She never participates in exercise. Her breakfast blood glucose range is generally >200 mg/dl. Her lunch blood glucose range is generally >200 mg/dl. Her dinner blood glucose range is generally >200 mg/dl. Her overall blood glucose range is >200 mg/dl. An ACE inhibitor/angiotensin II receptor blocker is not being taken.  Hyperlipidemia  This is a chronic problem. The current episode started more than 1 year ago. The problem is uncontrolled. Recent lipid tests were reviewed and are high. Exacerbating diseases include diabetes and obesity. Factors aggravating her hyperlipidemia include smoking. Pertinent negatives include no chest pain, myalgias or shortness of breath. Current antihyperlipidemic treatment includes statins. The current treatment provides no improvement of lipids. Risk factors for coronary artery disease include diabetes mellitus, dyslipidemia, family history, obesity, hypertension and a sedentary lifestyle.  Hypertension  Associated symptoms include blurred vision. Pertinent negatives include no chest pain, headaches, palpitations or shortness of breath. Hypertensive end-organ damage includes CVA.      Review of Systems  Constitutional:  Positive for fatigue. Negative for chills, fever and unexpected weight change.  HENT: Negative for trouble swallowing and voice change.   Eyes: Positive for blurred vision. Negative for visual disturbance.  Respiratory: Positive for cough. Negative for shortness of breath and wheezing.   Cardiovascular: Negative for chest pain, palpitations and leg swelling.  Gastrointestinal: Negative for diarrhea, nausea and vomiting.  Endocrine: Positive for polydipsia and polyuria. Negative for cold intolerance, heat intolerance and polyphagia.  Musculoskeletal: Negative for arthralgias and myalgias.  Skin: Negative for color  change, pallor, rash and wound.  Neurological: Negative for seizures and headaches.  Psychiatric/Behavioral: Negative for confusion and suicidal ideas.    Objective:    BP 109/78   Pulse 63   Ht 5\' 4"  (1.626 m)   Wt 224 lb (101.6 kg)   BMI 38.45 kg/m   Wt Readings from Last 3 Encounters:  11/19/16 224 lb (101.6 kg)  11/14/16 217 lb (98.4 kg)  11/14/16 217 lb (98.4 kg)    Physical Exam  Constitutional: She is oriented to person, place, and time. She appears well-developed.  HENT:  Head: Normocephalic and atraumatic.  Eyes: EOM are normal.  Neck: Normal range of motion. Neck supple. No tracheal deviation present. No thyromegaly present.  Cardiovascular: Normal rate and regular rhythm.   Pulmonary/Chest: Effort normal and breath sounds normal.  Abdominal: Soft. Bowel sounds are normal. There is no tenderness. There is no guarding.  Musculoskeletal: Normal range of motion. She exhibits no edema.  Neurological: She is alert and oriented to person, place, and time. She has normal reflexes. No cranial nerve deficit. Coordination normal.  Skin: Skin is warm and dry. No rash noted. No erythema. No pallor.  Psychiatric: Judgment normal.  Patient is reluctant .     CMP     Component Value Date/Time   NA 137 11/05/2016 1300   NA 141 07/07/2015 1052   K 4.3 11/05/2016 1300    CL 102 11/05/2016 1300   CO2 25 11/05/2016 1300   GLUCOSE 251 (H) 11/05/2016 1300   BUN 15 11/05/2016 1300   BUN 17 07/07/2015 1052   CREATININE 0.84 11/05/2016 1300   CREATININE 0.91 07/03/2016 1256   CALCIUM 9.8 11/05/2016 1300   PROT 7.5 11/05/2016 1300   PROT 6.7 07/07/2015 1052   ALBUMIN 4.1 11/05/2016 1300   ALBUMIN 4.4 07/07/2015 1052   AST 34 11/05/2016 1300   ALT 37 11/05/2016 1300   ALKPHOS 103 11/05/2016 1300   BILITOT 0.4 11/05/2016 1300   BILITOT 0.2 07/07/2015 1052   GFRNONAA >60 11/05/2016 1300   GFRAA >60 11/05/2016 1300     Diabetic Labs (most recent): Lab Results  Component Value Date   HGBA1C 11.7 (H) 11/05/2016   HGBA1C 11.4 (H) 06/24/2016   HGBA1C 8.1 11/09/2015     Lipid Panel ( most recent) Lipid Panel     Component Value Date/Time   CHOL 186 11/05/2016 1300   CHOL 276 (H) 07/07/2015 1052   TRIG 130 11/05/2016 1300   TRIG 422 (H) 01/30/2015 1329   HDL 87 11/05/2016 1300   HDL 42 07/07/2015 1052   HDL 46 01/30/2015 1329   CHOLHDL 2.1 11/05/2016 1300   VLDL 26 11/05/2016 1300   LDLCALC 73 11/05/2016 1300   LDLCALC 162 (H) 07/07/2015 1052      Assessment & Plan:   1. DM type 2 causing vascular disease (HCC)  - Patient has currently uncontrolled symptomatic type 2 DM since  51 years of age. - She monitored 47 times the last 14 days averaging 231. Her most recent A1c states high at 11.7%. Recent labs reviewed, showing normal renal function.   Her diabetes is complicated by CVA, neuropathy, noncompliance/nonadherence, obesity/sedentary life, chronic heavy smoking and patient remains at a high risk for more acute and chronic complications of diabetes which include CAD, CVA, CKD, retinopathy, and neuropathy. These are all discussed in detail with the patient.  - I have counseled the patient on diet management and weight loss, by adopting a carbohydrate restricted/protein rich diet.  -  Suggestion is made for patient to avoid simple  carbohydrates   from their diet including Cakes , Desserts, Ice Cream,  Soda (  diet and regular) , Sweet Tea , Candies,  Chips, Cookies, Artificial Sweeteners,   and "Sugar-free" Products . This will help patient to have stable blood glucose profile and potentially avoid unintended weight gain.  - I encouraged the patient to switch to  unprocessed or minimally processed complex starch and increased protein intake (animal or plant source), fruits, and vegetables.  - Patient is advised to stick to a routine mealtimes to eat 3 meals  a day and avoid unnecessary snacks ( to snack only to correct hypoglycemia).  - The patient will be scheduled with Norm Salt, RDN, CDE for individualized DM education.  - I have approached patient with the following individualized plan to manage diabetes and patient agrees:   - Based on her glycemic burden, she will continue to require basal/bolus insulin therapy. -  I will increase her Levemir to 70 units daily at bedtime, continue prandial insulin NovoLog  15 units TIDAC for pre-meal BG readings of 90-150mg /dl, plus patient specific correction dose for unexpected hyperglycemia above 150mg /dl, associated with strict monitoring of glucose  AC and HS. - Patient is warned not to take insulin without proper monitoring per orders. -Adjustment parameters are given for hypo and hyperglycemia in writing. -Patient is encouraged to call clinic for blood glucose levels less than 70 or above 300 mg /dl. - I will continue metformin 1000 g by mouth twice a day. - She could not get  Invokana from the health department. I will discontinue.   - Patient is not a suitable candidate for for incretin therapy, due to her high triglyceride levels and chronic heavy smoking putting her at risk for pancreatitis.  - Patient specific target  A1c;  LDL, HDL, Triglycerides, and  Waist Circumference were discussed in detail.  2) BP/HTN: Uncontrolled.  I discussed blood pressure goal with her.  I advised her to continue current medications, and  had low-dose losartan. 3) Lipids/HPL:   uncontrolled with extremely high triglyceride levels. I advised her to continue statins, and add Lovaza.  4)  Weight/Diet: CDE Consult will be initiated , exercise, and detailed carbohydrates information provided.  5) vitamin D deficiency: New diagnosis for her. I would initiate vitamin D 3 5000 units daily for the next 90 days.   6) Chronic Care/Health Maintenance:  -Patient is on ACEI/ARB and Statin medications and encouraged to continue to follow up with Ophthalmology, Podiatrist at least yearly or according to recommendations, and advised to  quit smoking. I have recommended yearly flu vaccine and pneumonia vaccination at least every 5 years; moderate intensity exercise for up to 150 minutes weekly; and  sleep for at least 7 hours a day.  - 30 minutes of time was spent on the care of this patient , 50% of which was applied for counseling on diabetes complications and their preventions.  - Patient to bring meter and  blood glucose logs during their next visit.   - I advised patient to maintain close follow up with Jacquelin Hawking, PA-C for primary care needs.  Follow up plan: - Return in about 3 months (around 02/16/2017) for meter, and logs, follow up with pre-visit labs, meter, and logs.  Marquis Lunch, MD Phone: [phone]  Fax: [phone]   11/19/2016, 10:13 AM

## 2016-11-22 ENCOUNTER — Other Ambulatory Visit: Payer: Self-pay

## 2016-11-22 ENCOUNTER — Telehealth: Payer: Self-pay | Admitting: Cardiology

## 2016-11-22 DIAGNOSIS — M7989 Other specified soft tissue disorders: Secondary | ICD-10-CM

## 2016-11-22 MED ORDER — FUROSEMIDE 40 MG PO TABS: 40.0000 mg | ORAL_TABLET | Freq: Every morning | ORAL | 6 refills | Status: DC

## 2016-11-22 NOTE — Telephone Encounter (Signed)
Increase Lasix to 40 mg daily and obtain follow-up echocardiogram.

## 2016-11-22 NOTE — Telephone Encounter (Signed)
Extremity swelling per pt phone call --please give her a call @ [phone]

## 2016-11-22 NOTE — Telephone Encounter (Signed)
Pt's weight 224 lbs , does not have scale at home.Weight  was from visit on 11/19/16 with Dr Fransico Him.Weight with our visit on 11/14/16 was 217 lbs. Pt reports since Monday, she has had swollen feet and ankles and cannot get her shoes on. Her "POA" does her medications for her and she thinks she takes lasix 40 mg daily.    Please advise
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
## 2016-11-22 NOTE — Telephone Encounter (Signed)
I called her pharmacy, she has been taking lasix 20 mg daily prescribed by a NP "Lowell Guitar" since December 2017    Please advise

## 2016-11-22 NOTE — Telephone Encounter (Signed)
lasix 40 mg refilled

## 2016-11-22 NOTE — Telephone Encounter (Signed)
Would verify with her pharmacy whether Lasix is up to date, there is a prior history of noncompliance. If she has been on Lasix 40 mg daily, would have her double it through the weekend and then go back to prior dose. She also needs an echocardiogram for reevaluation of LVEF as this has not been checked recently.

## 2016-11-22 NOTE — Addendum Note (Signed)
Addended by: Marlyn Corporal A on: 11/22/2016 01:47 PM   Modules accepted: Orders

## 2016-11-22 NOTE — Telephone Encounter (Signed)
Pt notified to take lasix 40 mg daily and [facility] Client will schedule echo

## 2016-11-26 ENCOUNTER — Ambulatory Visit (INDEPENDENT_AMBULATORY_CARE_PROVIDER_SITE_OTHER): Payer: Medicaid Other | Admitting: Psychiatry

## 2016-11-26 ENCOUNTER — Ambulatory Visit (HOSPITAL_COMMUNITY)
Admission: RE | Admit: 2016-11-26 | Discharge: 2016-11-26 | Disposition: A | Discharge status: Home / Self Care | Payer: Medicaid Other | Source: Ambulatory Visit | Attending: Cardiology | Admitting: Cardiology

## 2016-11-26 ENCOUNTER — Encounter (HOSPITAL_COMMUNITY): Payer: Self-pay | Admitting: Psychiatry

## 2016-11-26 VITALS — BP 110/76 | HR 66 | Ht 64.0 in | Wt 227.0 lb

## 2016-11-26 DIAGNOSIS — F17210 Nicotine dependence, cigarettes, uncomplicated: Secondary | ICD-10-CM

## 2016-11-26 DIAGNOSIS — Z720 Tobacco use: Secondary | ICD-10-CM | POA: Insufficient documentation

## 2016-11-26 DIAGNOSIS — E119 Type 2 diabetes mellitus without complications: Secondary | ICD-10-CM

## 2016-11-26 DIAGNOSIS — I119 Hypertensive heart disease without heart failure: Secondary | ICD-10-CM | POA: Insufficient documentation

## 2016-11-26 DIAGNOSIS — F4310 Post-traumatic stress disorder, unspecified: Secondary | ICD-10-CM | POA: Diagnosis not present

## 2016-11-26 DIAGNOSIS — Z888 Allergy status to other drugs, medicaments and biological substances status: Secondary | ICD-10-CM | POA: Diagnosis not present

## 2016-11-26 DIAGNOSIS — J449 Chronic obstructive pulmonary disease, unspecified: Secondary | ICD-10-CM | POA: Insufficient documentation

## 2016-11-26 DIAGNOSIS — M7989 Other specified soft tissue disorders: Secondary | ICD-10-CM

## 2016-11-26 DIAGNOSIS — Z79899 Other long term (current) drug therapy: Secondary | ICD-10-CM | POA: Diagnosis not present

## 2016-11-26 DIAGNOSIS — F331 Major depressive disorder, recurrent, moderate: Secondary | ICD-10-CM

## 2016-11-26 DIAGNOSIS — Z794 Long term (current) use of insulin: Secondary | ICD-10-CM | POA: Diagnosis not present

## 2016-11-26 DIAGNOSIS — I10 Essential (primary) hypertension: Secondary | ICD-10-CM

## 2016-11-26 DIAGNOSIS — K219 Gastro-esophageal reflux disease without esophagitis: Secondary | ICD-10-CM

## 2016-11-26 LAB — ECHOCARDIOGRAM COMPLETE
Height: 64 in
Weight: 3632 oz

## 2016-11-26 MED ORDER — MIRTAZAPINE 15 MG PO TABS: 15.0000 mg | ORAL_TABLET | Freq: Every day | ORAL | 1 refills | Status: DC

## 2016-11-26 MED ORDER — FLUOXETINE HCL 60 MG PO TABS: 60.0000 mg | ORAL_TABLET | Freq: Every day | ORAL | 1 refills | Status: DC

## 2016-11-26 NOTE — Progress Notes (Signed)
*  PRELIMINARY RESULTS* Echocardiogram 2D Echocardiogram has been performed.  [name] 11/26/2016, 12:14 PM

## 2016-11-26 NOTE — Patient Instructions (Addendum)
1. Increase fluoxetine 60 mg daily 2. Increase mirtazapine 15 mg at night 3. Return to clinic in one month 4. [facility] office for therapy [phone] [address] [place], [place], [place] [address]

## 2016-11-28 ENCOUNTER — Other Ambulatory Visit (HOSPITAL_COMMUNITY): Payer: Self-pay

## 2016-12-05 NOTE — Telephone Encounter (Signed)
I see at patient's visit on 11/26/16 that she is no longer taking the prednisone.

## 2016-12-11 ENCOUNTER — Other Ambulatory Visit (HOSPITAL_COMMUNITY): Payer: Self-pay | Admitting: *Deleted

## 2016-12-12 ENCOUNTER — Encounter (HOSPITAL_COMMUNITY)
Admission: RE | Admit: 2016-12-12 | Discharge: 2016-12-12 | Disposition: A | Discharge status: Home / Self Care | Payer: Medicaid Other | Source: Ambulatory Visit | Attending: Gastroenterology | Admitting: Gastroenterology

## 2016-12-12 DIAGNOSIS — K50119 Crohn's disease of large intestine with unspecified complications: Secondary | ICD-10-CM

## 2016-12-12 MED ORDER — DIPHENHYDRAMINE HCL 25 MG PO CAPS
ORAL_CAPSULE | ORAL | Status: AC
Start: 2016-12-12 — End: 2016-12-12
  Filled 2016-12-12: qty 2

## 2016-12-12 MED ORDER — SODIUM CHLORIDE 0.9 % IV SOLN
Freq: Once | INTRAVENOUS | Status: AC
  Administered 2016-12-12: 09:00:00 via INTRAVENOUS

## 2016-12-12 MED ORDER — DIPHENHYDRAMINE HCL 25 MG PO TABS
50.0000 mg | ORAL_TABLET | Freq: Once | ORAL | Status: AC
  Administered 2016-12-12: 50 mg via ORAL
  Filled 2016-12-12: qty 2

## 2016-12-12 MED ORDER — ACETAMINOPHEN 325 MG PO TABS
650.0000 mg | ORAL_TABLET | ORAL | Status: DC
  Administered 2016-12-12: 09:00:00 650 mg via ORAL

## 2016-12-12 MED ORDER — SODIUM CHLORIDE 0.9 % IV SOLN
5.0000 mg/kg | Freq: Once | INTRAVENOUS | Status: AC
  Administered 2016-12-12: 09:00:00 500 mg via INTRAVENOUS
  Filled 2016-12-12: qty 50

## 2016-12-12 MED ORDER — ACETAMINOPHEN 325 MG PO TABS
ORAL_TABLET | ORAL | Status: AC
Start: 2016-12-12 — End: 2016-12-12
  Filled 2016-12-12: qty 2

## 2016-12-19 ENCOUNTER — Ambulatory Visit (INDEPENDENT_AMBULATORY_CARE_PROVIDER_SITE_OTHER): Payer: Medicaid Other | Admitting: Gastroenterology

## 2016-12-19 ENCOUNTER — Telehealth: Payer: Self-pay | Admitting: Gastroenterology

## 2016-12-19 ENCOUNTER — Other Ambulatory Visit: Payer: Self-pay | Admitting: Gastroenterology

## 2016-12-19 ENCOUNTER — Encounter: Payer: Self-pay | Admitting: Gastroenterology

## 2016-12-19 VITALS — BP 116/74 | HR 92 | Ht 64.0 in | Wt 227.4 lb

## 2016-12-19 DIAGNOSIS — R1310 Dysphagia, unspecified: Secondary | ICD-10-CM

## 2016-12-19 DIAGNOSIS — K50119 Crohn's disease of large intestine with unspecified complications: Secondary | ICD-10-CM | POA: Diagnosis not present

## 2016-12-19 DIAGNOSIS — K219 Gastro-esophageal reflux disease without esophagitis: Secondary | ICD-10-CM | POA: Diagnosis not present

## 2016-12-19 DIAGNOSIS — R197 Diarrhea, unspecified: Secondary | ICD-10-CM

## 2016-12-19 MED ORDER — DICYCLOMINE HCL 20 MG PO TABS: 20.0000 mg | ORAL_TABLET | Freq: Three times a day (TID) | ORAL | 1 refills | Status: DC | PRN

## 2016-12-19 NOTE — Telephone Encounter (Signed)
Pt informed that the procedure will take at least 45 min and that she will be able to return to her normal activities after the procedure. Also informed that she will not need to stay over night.  She should hold her diabetic meds the morning of her procedure. Explained that the test will monitor how she swallows and that she will be given water to drink.

## 2016-12-19 NOTE — Patient Instructions (Signed)
If you are age 51 or older, your body mass index should be between 23-30. Your Body mass index is 39.03 kg/m. If this is out of the aforementioned range listed, please consider follow up with your Primary Care Provider.  If you are age 54 or younger, your body mass index should be between 19-25. Your Body mass index is 39.03 kg/m. If this is out of the aformentioned range listed, please consider follow up with your Primary Care Provider.   We have sent the following medications to your pharmacy for you to pick up at your convenience:  Bentyl  Your physician has requested that you go to the basement for the following lab work before leaving today:   CBC, Cmet, C. Diff Stool Antigen  You have been scheduled for an esophageal manometry at [facility] on Monday, April 9th at 12:30pm. Please arrive 30 minutes prior to your procedure for registration. You will need to go to outpatient registration (1st floor of the hospital) first. Make certain to bring your insurance cards as well as a complete list of medications.  Please remember the following:  1) Do not take any muscle relaxants, xanax (alprazolam) or ativan for 1 day prior to your test as well as the day of the test.  2) Nothing to eat or drink after 12:00 midnight on the night before your test.  3) Hold all diabetic medications/insulin the morning of the test. You may eat and take your medications after the test.  It will take at least 2 weeks to receive the results of this test from your physician. ------------------------------------------ ABOUT ESOPHAGEAL MANOMETRY Esophageal manometry (muh-NOM-uh-tree) is a test that gauges how well your esophagus works. Your esophagus is the long, muscular tube that connects your throat to your stomach. Esophageal manometry measures the rhythmic muscle contractions (peristalsis) that occur in your esophagus when you swallow. Esophageal manometry also measures the coordination and force  exerted by the muscles of your esophagus.  During esophageal manometry, a thin, flexible tube (catheter) that contains sensors is passed through your nose, down your esophagus and into your stomach. Esophageal manometry can be helpful in diagnosing some mostly uncommon disorders that affect your esophagus.  Why it's done Esophageal manometry is used to evaluate the movement (motility) of food through the esophagus and into the stomach. The test measures how well the circular bands of muscle (sphincters) at the top and bottom of your esophagus open and close, as well as the pressure, strength and pattern of the wave of esophageal muscle contractions that moves food along.  What you can expect Esophageal manometry is an outpatient procedure done without sedation. Most people tolerate it well. You may be asked to change into a hospital gown before the test starts.  During esophageal manometry  While you are sitting up, a member of your health care team sprays your throat with a numbing medication or puts numbing gel in your nose or both.  A catheter is guided through your nose into your esophagus. The catheter may be sheathed in a water-filled sleeve. It doesn't interfere with your breathing. However, your eyes may water, and you may gag. You may have a slight nosebleed from irritation.  After the catheter is in place, you may be asked to lie on your back on an exam table, or you may be asked to remain seated.  You then swallow small sips of water. As you do, a computer connected to the catheter records the pressure, strength and pattern of  your esophageal muscle contractions.  During the test, you'll be asked to breathe slowly and smoothly, remain as still as possible, and swallow only when you're asked to do so.  A member of your health care team may move the catheter down into your stomach while the catheter continues its measurements.  The catheter then is slowly withdrawn. The test usually lasts 20  to 30 minutes.  After esophageal manometry  When your esophageal manometry is complete, you may return to your normal activities  This test typically takes 30-45 minutes to complete. ________________________________________________________________________________

## 2016-12-19 NOTE — Progress Notes (Signed)
HPI :  51 y/o female with a history colonic Crohn's disease, oxygen dependent COPD, history of stroke 2 on Plavix and aspirin, here for a follow up visit.   Since her last visit with me she had a colonoscopy on January 5 showing severe inflammatory changes from the transverse to right colon along with a normal ileum. 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 She was started on prednisone and then transitioned to Remicade and Apriso.   She reports in general she continues to feel poorly. Her main symptom now is abdominal discomfort, she feels it in the lower abdomen. She feels it intermittently, associated with bowel movements. She is having variable stool frequency up to 8 bowel movements per day. She reports stools are somewhat loose, but more formed than previous. She has rare blood in the stools, improved from previous. No nausea or vomiting. She thinks she is gaining weight. Eating makes her feel worse. Poor appetite.  She has been on Remicade for 4 doses.   She is taking protonix 40mg  BID and reflux continues to bother her significantly, she has fairly frequent symptoms of heartburn. She continues to have dysphagia, despite the dilation. She is on vitamin D supplement. She continues to smoke cigarettes routinely. She denies NSAID use.  Prior endoscopic evaluation: EGD 05/02/09 - GEJ stricture, dilated to 72mm, iregular z-line, mild gastritis Capsule endoscopy 11/02/08 - incomplete study Colonoscopy 10/18/2008 - erosions on the IC valve and cecum - biopsies showed chronic active colitis CT enterography 11/2008 - normal, no evidence of IBD Colonoscopy 09/27/16 - patchy severe ulceration in transverse and right colon, normal TI - oath most c/w Crohns versus eosinophilic colitis EGD 09/27/16 - normal esophagus, empiric dilation to 59mm, mild gastritis   Past Medical History:  Diagnosis Date  . Anxiety   . Arthritis    . Cardiac pacemaker in situ 2009   DDD [employer] -- [place] 60  . COPD with asthma (HCC)    GOLD 2-3 --  pulmologist (last visit 2011) Dr. Marchelle Gearing  . Crohn's disease (HCC)    Large intestine  . Depression 2016   PTSD  . Emphysema lung (HCC)   . Essential hypertension   . Family history of adverse reaction to anesthesia    "father would get sick"  . Gastroparesis   . GERD (gastroesophageal reflux disease)   . History of hiatal hernia   . History of kidney stones   . History of stroke    Jun 2011 -- right hand weakness  . History of syncope   . Inappropriate sinus tachycardia    Sinus node modification 02-25-2003 by Dr. Lewayne Bunting  . LLQ abdominal tenderness 10/20/2015  . OSA (obstructive sleep apnea)    Study done 2005 -- pt refused CPAP/previously was using nocturnal oxygen until one year ago pt states PCP is monitoring pt without  . Pelvic pain in female   . PTSD (post-traumatic stress disorder)   . Sinus node dysfunction (HCC)    Symptomatic bradycardia  . Stroke (HCC)   . Symptomatic sinus bradycardia   . Type 2 diabetes mellitus (HCC)   . Wears glasses      Past Surgical History:  Procedure Laterality Date  . ABDOMINAL HYSTERECTOMY    . BILATERAL KNEE ARTHROSCOPY W/ CHONDROMALACIA PATELLA  bilateral ---- 12-26-2010; 08-14-2009;  04-25-2008;  03-09-2007   additional same surgery, Left knee 2005;  x2 2006 ---  Right knee 2005;  2006;  x2  2007  . CARDIAC CATHETERIZATION  05-08-2001  dr Nicki Guadalajara   normal coronary arteries and LVF  . CARDIAC ELECTROPHYSIOLOGY STUDY W/  SINUS NODE MODIFICATION  02-25-2003  dr Sharlot Gowda taylor  . CARDIAC PACEMAKER PLACEMENT  10-16-2007  dr Lewayne Bunting   DDD-- [facility] 60  . CARDIOVASCULAR STRESS TEST  08-02-2010  dr Diona Browner   normal lexiscan study/  ef 59%  . CESAREAN SECTION  x2  . CHOLECYSTECTOMY  1994  . COLONOSCOPY WITH PROPOFOL N/A 09/27/2016   Procedure: COLONOSCOPY WITH PROPOFOL;  Surgeon: Ruffin Frederick, MD;  Location: [facility];  Service: Gastroenterology;  Laterality: N/A;  . CYSTO/  URETEROSCOPIC STONE EXTRACTION  03/ 2005  . CYSTOSCOPY WITH HYDRODISTENSION AND BIOPSY N/A 05/23/2015   Procedure: CYSTOSCOPY/BIOPSY/HYDRODISTENSION;  Surgeon: Su Grand, MD;  Location: [facility];  Service: Urology;  Laterality: N/A;  . ESOPHAGOGASTRODUODENOSCOPY (EGD) WITH PROPOFOL N/A 09/27/2016   Procedure: ESOPHAGOGASTRODUODENOSCOPY (EGD) WITH PROPOFOL;  Surgeon: Ruffin Frederick, MD;  Location: [facility];  Service: Gastroenterology;  Laterality: N/A;  . TONSILLECTOMY  11-25-2002   Family History  Problem Relation Age of Onset  . Diabetes Mother   . Asthma Mother   . Heart disease Mother   . Cirrhosis Mother   . Stroke Mother   . Heart disease Father     Deceased. MI. Mother, 2 brothers, sister, nephew also have heart disease  . Emphysema Father     Died of it. Was pt of Dr. Sherene Sires   . Heart attack Father   . Cancer Father     ? type  . Diabetes Brother   . Heart disease Brother   . Diabetes Sister   . Cirrhosis Sister   . Cirrhosis Maternal Grandmother   . Diabetes Brother   . Heart attack Brother   . Heart disease Brother   . Seizures Brother   . Colon cancer Neg Hx   . Stomach cancer Neg Hx   . Esophageal cancer Neg Hx   . Rectal cancer Neg Hx   . Liver cancer Neg Hx    Social History  Substance Use Topics  . Smoking status: Current Every Day Smoker    Packs/day: 2.00    Years: 42.00    Types: Cigarettes    Start date: 02/02/1973  . Smokeless tobacco: Never Used     Comment: smoke about 2 packs because I recently lost my mom.  . Alcohol use No     Comment: 08-29-2016 per pt Occas.  on special occasions   Current Outpatient Prescriptions  Medication Sig Dispense Refill  . albuterol (PROVENTIL) (2.5 MG/3ML) 0.083% nebulizer solution Take 3 mLs (2.5 mg total) by nebulization every 6 (six) hours as needed for wheezing or shortness of breath.  75 mL 12  . aspirin EC 81 MG tablet Take 81 mg by mouth daily.    . BD PEN NEEDLE NANO U/F 32G X 4 MM MISC 1 each by Does not apply route 4 (four) times daily. 120 each 0  . carvedilol (COREG) 12.5 MG tablet Take 1 tablet (12.5 mg total) by mouth 2 (two) times daily with a meal. 60 tablet 0  . Cholecalciferol (VITAMIN D3) 5000 units CAPS Take 1 capsule (5,000 Units total) by mouth daily. 90 capsule 0  . clopidogrel (PLAVIX) 75 MG tablet TAKE 1 TABLET BY MOUTH EVERY DAY 30 tablet 1  . colestipol (COLESTID) 1 g tablet Take 1 tablet (1 g total) by mouth 2 (  two) times daily. 90 tablet 3  . diclofenac sodium (VOLTAREN) 1 % GEL Apply 4 g topically 4 (four) times daily. 200 g 11  . FLUoxetine HCl 60 MG TABS Take 60 mg by mouth daily. 30 tablet 1  . furosemide (LASIX) 40 MG tablet Take 1 tablet (40 mg total) by mouth every morning. 30 tablet 6  . gemfibrozil (LOPID) 600 MG tablet Take 1 tablet (600 mg total) by mouth 2 (two) times daily before a meal. 60 tablet 3  . glucose blood (ACCU-CHEK GUIDE) test strip Use as instructed 4 x daily. E11.65 150 each 5  . inFLIXimab (REMICADE) 100 MG injection Inject 500 mg into the vein every 8 (eight) weeks. Needs loading dosage of 0,2,6 weeks then every 8 weeks thereafter 1 each 3  . insulin aspart (NOVOLOG FLEXPEN) 100 UNIT/ML FlexPen Inject 15-21 Units into the skin 3 (three) times daily with meals. 5 pen 2  . LEVEMIR FLEXTOUCH 100 UNIT/ML Pen INJECT 80 UNITS UNDER THE SKIN EVERY DAY AT 10 PM 30 mL 2  . losartan (COZAAR) 25 MG tablet Take 1 tablet (25 mg total) by mouth daily. 30 tablet 3  . LYRICA 150 MG capsule TK 1 C PO TID  0  . mesalamine (APRISO) 0.375 g 24 hr capsule Take 4 capsules (1.5 g total) by mouth every morning. 120 capsule 1  . metFORMIN (GLUCOPHAGE) 1000 MG tablet TAKE 1 TABLET(1000 MG) BY MOUTH TWICE DAILY WITH A MEAL 60 tablet 3  . mirtazapine (REMERON) 15 MG tablet Take 1 tablet (15 mg total) by mouth at bedtime. 30 tablet 1  . nitroGLYCERIN  (NITROSTAT) 0.4 MG SL tablet Place 1 tablet (0.4 mg total) under the tongue every 5 (five) minutes as needed for chest pain. 30 tablet 0  . NUCYNTA 75 MG tablet Take 75 mg by mouth 3 (three) times daily as needed for pain.  0  . OXYGEN Inhale 3 L/day into the lungs at bedtime.    . pantoprazole (PROTONIX) 40 MG tablet Take 1 tablet (40 mg total) by mouth 2 (two) times daily. 90 tablet 3  . simvastatin (ZOCOR) 40 MG tablet Take 1 tablet (40 mg total) by mouth every evening. 30 tablet 6  . SYMBICORT 160-4.5 MCG/ACT inhaler INHALE 2 PUFFS INTO THE LUNGS EVERY 12 HOURS 10.2 g 0  . tiZANidine (ZANAFLEX) 4 MG tablet Take 4 mg by mouth 3 (three) times daily as needed for muscle spasms.  1   No current facility-administered medications for this visit.    Allergies  Allergen Reactions  . Flexeril [Cyclobenzaprine] Hives  . Amoxicillin Hives and Rash    Has patient had a PCN reaction causing immediate rash, facial/tongue/throat swelling, SOB or lightheadedness with hypotension: Yes Has patient had a PCN reaction causing severe rash involving mucus membranes or skin necrosis: Yes Has patient had a PCN reaction that required hospitalization No Has patient had a PCN reaction occurring within the last 10 years: Yes If all of the above answers are "NO", then may proceed with Cephalosporin use.      Review of Systems: All systems reviewed and negative except where noted in HPI.   Lab Results  Component Value Date   WBC 10.3 10/08/2016   HGB 11.7 (L) 10/08/2016   HCT 35.7 (L) 10/08/2016   MCV 90.0 10/08/2016   PLT 275.0 10/08/2016    Lab Results  Component Value Date   CREATININE 0.84 11/05/2016   BUN 15 11/05/2016   NA 137 11/05/2016  K 4.3 11/05/2016   CL 102 11/05/2016   CO2 25 11/05/2016    Lab Results  Component Value Date   ALT 37 11/05/2016   AST 34 11/05/2016   ALKPHOS 103 11/05/2016   BILITOT 0.4 11/05/2016    Lab Results  Component Value Date   CRP 0.2 (L) 10/08/2016       Physical Exam: BP 116/74   Pulse 92   Ht 5\' 4"  (1.626 m)   Wt 227 lb 6 oz (103.1 kg)   BMI 39.03 kg/m  Constitutional: Pleasant,well-developed, female in no acute distress. HEENT: Normocephalic and atraumatic. Conjunctivae are normal. No scleral icterus. Neck supple.  Cardiovascular: Normal rate, regular rhythm.  Pulmonary/chest: Effort normal and breath sounds normal. No wheezing, rales or rhonchi. Abdominal: Soft, nondistended, mild lower abdominal TTP without rebound / guarding . There are no masses palpable. No hepatomegaly. Extremities: no edema Lymphadenopathy: No cervical adenopathy noted. Neurological: Alert and oriented to person place and time. Skin: Skin is warm and dry. No rashes noted. Psychiatric: Normal mood and affect. Behavior is normal.   ASSESSMENT AND PLAN: 51 year old female here for follow-up for suspected severe Crohn's colitis and reflux. History as outlined above.  Crohn's colitis - reviewed colonoscopy pathology slides with pathologist and this is the most likely diagnosis. She did improve with steroids and now on Remicade and Apriso however remains fairly symptomatic. It also possible she has a component of concomittant IBS as well. I discussed options with the patient and recommend the following: - basic labs today - CBC, CMET, and send stools for C Diff - add immodium PRN - add bentyl PRN - will check Remicade levels prior to next infusion - If labs okay, C. difficile negative, and symptoms persist, we may need to consider a colonoscopy to assess response to therapy. I counseled her that primary nonresponse to anti-TNF is possible although uncommon, and if she is not better we need to assess for this. We'll await labs and her course, and asked her to call me in a 2 weeks for reassessment if she is not any better. - complete tobacco cessation. We discussed this for a bit and she is working on this but continues to smoke, she declined assistance with  quitting  GERD / Dysphagia - prior EGD with a normal esophagus. She is on high-dose PPI with persistent symptoms and states no benefit. This makes me question whether or not reflux is causing her symptoms. Further she has persistent dysphagia and had no pathology to account for this on EGD, with no benefit from dilation. Recommend esophageal manometry and 24-hour pH impedance study on high-dose PPIs to further evaluate.   Ileene Patrick, MD [facility] Pager [phone]

## 2016-12-19 NOTE — Telephone Encounter (Signed)
Routed to Ashley. 
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
## 2016-12-25 ENCOUNTER — Ambulatory Visit (HOSPITAL_COMMUNITY): Payer: Self-pay | Admitting: Psychology

## 2016-12-27 ENCOUNTER — Ambulatory Visit (HOSPITAL_COMMUNITY): Payer: Self-pay | Admitting: Psychiatry
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
## 2016-12-30 ENCOUNTER — Ambulatory Visit (HOSPITAL_COMMUNITY)
Admission: RE | Admit: 2016-12-30 | Discharge: 2016-12-30 | Disposition: A | Discharge status: Home / Self Care | Payer: Medicaid Other | Source: Ambulatory Visit | Attending: Gastroenterology | Admitting: Gastroenterology

## 2016-12-30 ENCOUNTER — Encounter (HOSPITAL_COMMUNITY)
Admission: RE | Disposition: A | Discharge status: Home / Self Care | Payer: Self-pay | Source: Ambulatory Visit | Attending: Gastroenterology

## 2016-12-30 DIAGNOSIS — K219 Gastro-esophageal reflux disease without esophagitis: Secondary | ICD-10-CM | POA: Diagnosis not present

## 2016-12-30 DIAGNOSIS — R1310 Dysphagia, unspecified: Secondary | ICD-10-CM | POA: Diagnosis not present

## 2016-12-30 HISTORY — PX: ESOPHAGEAL MANOMETRY: SHX5429

## 2016-12-30 HISTORY — PX: 24 HOUR PH STUDY: SHX5419

## 2016-12-30 SURGERY — MANOMETRY, ESOPHAGUS

## 2016-12-30 MED ORDER — LIDOCAINE VISCOUS 2 % MT SOLN
OROMUCOSAL | Status: AC
  Filled 2016-12-30: qty 15

## 2016-12-30 SURGICAL SUPPLY — 2 items
FACESHIELD LNG OPTICON STERILE (SAFETY) IMPLANT
GLOVE BIO SURGEON STRL SZ8 (GLOVE) ×6 IMPLANT

## 2016-12-30 NOTE — Progress Notes (Signed)
Esophageal Mano done per protocol.  Pt tolerated well w/o complication.  Pt. On PPI.  Dr. Adela Lank to be emailed today.  Omelia Blackwater, RN

## 2016-12-31 ENCOUNTER — Ambulatory Visit (HOSPITAL_COMMUNITY): Payer: Self-pay | Admitting: Psychiatry

## 2016-12-31 ENCOUNTER — Other Ambulatory Visit (INDEPENDENT_AMBULATORY_CARE_PROVIDER_SITE_OTHER): Payer: Medicaid Other

## 2016-12-31 ENCOUNTER — Encounter (HOSPITAL_COMMUNITY): Payer: Self-pay

## 2016-12-31 ENCOUNTER — Encounter (HOSPITAL_COMMUNITY): Payer: Self-pay | Admitting: Gastroenterology

## 2016-12-31 DIAGNOSIS — K50119 Crohn's disease of large intestine with unspecified complications: Secondary | ICD-10-CM

## 2016-12-31 DIAGNOSIS — K219 Gastro-esophageal reflux disease without esophagitis: Secondary | ICD-10-CM | POA: Diagnosis not present

## 2016-12-31 LAB — COMPREHENSIVE METABOLIC PANEL
ALT: 14 U/L (ref 0–35)
AST: 23 U/L (ref 0–37)
Albumin: 4.4 g/dL (ref 3.5–5.2)
Alkaline Phosphatase: 105 U/L (ref 39–117)
BUN: 15 mg/dL (ref 6–23)
CALCIUM: 9.9 mg/dL (ref 8.4–10.5)
CHLORIDE: 101 meq/L (ref 96–112)
CO2: 21 meq/L (ref 19–32)
CREATININE: 1 mg/dL (ref 0.40–1.20)
GFR: 62.24 mL/min (ref >60.00)
Glucose, Bld: 312 mg/dL — ABNORMAL HIGH (ref 70–99)
POTASSIUM: 4 meq/L (ref 3.5–5.1)
SODIUM: 134 meq/L — AB (ref 135–145)
Total Bilirubin: 0.4 mg/dL (ref 0.2–1.2)
Total Protein: 7.9 g/dL (ref 6.0–8.3)

## 2016-12-31 LAB — CBC WITH DIFFERENTIAL/PLATELET
BASOS PCT: 0.9 % (ref 0.0–3.0)
Basophils Absolute: 0.1 10*3/uL (ref 0.0–0.1)
EOS ABS: 0.2 10*3/uL (ref 0.0–0.7)
EOS PCT: 1.9 % (ref 0.0–5.0)
HCT: 42.2 % (ref 36.0–46.0)
Hemoglobin: 13.7 g/dL (ref 12.0–15.0)
Lymphocytes Relative: 46.5 % — ABNORMAL HIGH (ref 12.0–46.0)
Lymphs Abs: 4.1 10*3/uL — ABNORMAL HIGH (ref 0.7–4.0)
MCHC: 32.5 g/dL (ref 30.0–36.0)
MCV: 85.5 fl (ref 78.0–100.0)
MONO ABS: 0.6 10*3/uL (ref 0.1–1.0)
Monocytes Relative: 7.2 % (ref 3.0–12.0)
NEUTROS ABS: 3.9 10*3/uL (ref 1.4–7.7)
Neutrophils Relative %: 43.5 % (ref 43.0–77.0)
PLATELETS: 257 10*3/uL (ref 150.0–400.0)
RBC: 4.94 Mil/uL (ref 3.87–5.11)
RDW: 16.6 % — AB (ref 11.5–15.5)
WBC: 8.9 10*3/uL (ref 4.0–10.5)

## 2016-12-31 NOTE — Progress Notes (Deleted)
Cardiology Office Note  Date: 12/31/2016   ID: [name], DOB [birth year], MRN [id number]  PCP: Meagan Hawking, PA-C  Primary Cardiologist: Meagan Dell, MD   No chief complaint on file.   History of Present Illness: [name] is a 51 y.o. female last seen in October 2017.  She follows with Dr. [name] in the device clinic, history of inappropriate sinus tachycardia status post sinus node modification with subsequent [facility] pacemaker placement.  She did have a recent follow-up echocardiogram as outlined below. LVEF remains normal with recent normal diastolic function.  Past Medical History:  Diagnosis Date  . Anxiety   . Arthritis   . Cardiac pacemaker in situ 2009   DDD [employer] -- [place]TRUNA 60  . COPD with asthma (HCC)    GOLD 2-3 --  pulmologist (last visit 2011) Dr. Marchelle Gearing  . Crohn's disease (HCC)    Large intestine  . Depression 2016   PTSD  . Emphysema lung (HCC)   . Essential hypertension   . Family history of adverse reaction to anesthesia    "father would get sick"  . Gastroparesis   . GERD (gastroesophageal reflux disease)   . History of hiatal hernia   . History of kidney stones   . History of stroke    Jun 2011 -- right hand weakness  . History of syncope   . Inappropriate sinus tachycardia    Sinus node modification 02-25-2003 by Dr. Lewayne Bunting  . LLQ abdominal tenderness 10/20/2015  . OSA (obstructive sleep apnea)    Study done 2005 -- pt refused CPAP/previously was using nocturnal oxygen until one year ago pt states PCP is monitoring pt without  . Pelvic pain in female   . PTSD (post-traumatic stress disorder)   . Sinus node dysfunction (HCC)    Symptomatic bradycardia  . Stroke (HCC)   . Symptomatic sinus bradycardia   . Type 2 diabetes mellitus (HCC)   . Wears glasses     Past Surgical History:  Procedure Laterality Date  . ABDOMINAL HYSTERECTOMY    . BILATERAL KNEE ARTHROSCOPY W/ CHONDROMALACIA PATELLA   bilateral ---- 12-26-2010; 08-14-2009;  04-25-2008;  03-09-2007   additional same surgery, Left knee 2005;  x2 2006 ---  Right knee 2005;  2006;  x2  2007  . CARDIAC CATHETERIZATION  05-08-2001  dr Nicki Guadalajara   normal coronary arteries and LVF  . CARDIAC ELECTROPHYSIOLOGY STUDY W/  SINUS NODE MODIFICATION  02-25-2003  dr Sharlot Gowda taylor  . CARDIAC PACEMAKER PLACEMENT  10-16-2007  dr Lewayne Bunting   DDD-- [facility] 60  . CARDIOVASCULAR STRESS TEST  08-02-2010  dr Diona Browner   normal lexiscan study/  ef 59%  . CESAREAN SECTION  x2  . CHOLECYSTECTOMY  1994  . COLONOSCOPY WITH PROPOFOL N/A 09/27/2016   Procedure: COLONOSCOPY WITH PROPOFOL;  Surgeon: Ruffin Frederick, MD;  Location: [facility];  Service: Gastroenterology;  Laterality: N/A;  . CYSTO/  URETEROSCOPIC STONE EXTRACTION  03/ 2005  . CYSTOSCOPY WITH HYDRODISTENSION AND BIOPSY N/A 05/23/2015   Procedure: CYSTOSCOPY/BIOPSY/HYDRODISTENSION;  Surgeon: Su Grand, MD;  Location: [facility];  Service: Urology;  Laterality: N/A;  . ESOPHAGOGASTRODUODENOSCOPY (EGD) WITH PROPOFOL N/A 09/27/2016   Procedure: ESOPHAGOGASTRODUODENOSCOPY (EGD) WITH PROPOFOL;  Surgeon: Ruffin Frederick, MD;  Location: [facility];  Service: Gastroenterology;  Laterality: N/A;  . TONSILLECTOMY  11-25-2002    Current Outpatient Prescriptions  Medication Sig Dispense Refill  . albuterol (PROVENTIL) (2.5 MG/3ML) 0.083% nebulizer  solution Take 3 mLs (2.5 mg total) by nebulization every 6 (six) hours as needed for wheezing or shortness of breath. 75 mL 12  . aspirin EC 81 MG tablet Take 81 mg by mouth daily.    . BD PEN NEEDLE NANO U/F 32G X 4 MM MISC 1 each by Does not apply route 4 (four) times daily. 120 each 0  . carvedilol (COREG) 12.5 MG tablet Take 1 tablet (12.5 mg total) by mouth 2 (two) times daily with a meal. 60 tablet 0  . Cholecalciferol (VITAMIN D3) 5000 units CAPS Take 1 capsule (5,000 Units total) by mouth daily. 90  capsule 0  . clopidogrel (PLAVIX) 75 MG tablet TAKE 1 TABLET BY MOUTH EVERY DAY 30 tablet 1  . colestipol (COLESTID) 1 g tablet Take 1 tablet (1 g total) by mouth 2 (two) times daily. 90 tablet 3  . diclofenac sodium (VOLTAREN) 1 % GEL Apply 4 g topically 4 (four) times daily. 200 g 11  . dicyclomine (BENTYL) 20 MG tablet Take 1 tablet (20 mg total) by mouth every 8 (eight) hours as needed for spasms. 90 tablet 1  . FLUoxetine HCl 60 MG TABS Take 60 mg by mouth daily. 30 tablet 1  . furosemide (LASIX) 40 MG tablet Take 1 tablet (40 mg total) by mouth every morning. 30 tablet 6  . gemfibrozil (LOPID) 600 MG tablet Take 1 tablet (600 mg total) by mouth 2 (two) times daily before a meal. 60 tablet 3  . glucose blood (ACCU-CHEK GUIDE) test strip Use as instructed 4 x daily. E11.65 150 each 5  . inFLIXimab (REMICADE) 100 MG injection Inject 500 mg into the vein every 8 (eight) weeks. Needs loading dosage of 0,2,6 weeks then every 8 weeks thereafter 1 each 3  . insulin aspart (NOVOLOG FLEXPEN) 100 UNIT/ML FlexPen Inject 15-21 Units into the skin 3 (three) times daily with meals. 5 pen 2  . LEVEMIR FLEXTOUCH 100 UNIT/ML Pen INJECT 80 UNITS UNDER THE SKIN EVERY DAY AT 10 PM 30 mL 2  . losartan (COZAAR) 25 MG tablet Take 1 tablet (25 mg total) by mouth daily. 30 tablet 3  . LYRICA 150 MG capsule TK 1 C PO TID  0  . mesalamine (APRISO) 0.375 g 24 hr capsule Take 4 capsules (1.5 g total) by mouth every morning. 120 capsule 1  . metFORMIN (GLUCOPHAGE) 1000 MG tablet TAKE 1 TABLET(1000 MG) BY MOUTH TWICE DAILY WITH A MEAL 60 tablet 3  . mirtazapine (REMERON) 15 MG tablet Take 1 tablet (15 mg total) by mouth at bedtime. 30 tablet 1  . nitroGLYCERIN (NITROSTAT) 0.4 MG SL tablet Place 1 tablet (0.4 mg total) under the tongue every 5 (five) minutes as needed for chest pain. 30 tablet 0  . NUCYNTA 75 MG tablet Take 75 mg by mouth 3 (three) times daily as needed for pain.  0  . OXYGEN Inhale 3 L/day into the lungs  at bedtime.    . pantoprazole (PROTONIX) 40 MG tablet Take 1 tablet (40 mg total) by mouth 2 (two) times daily. 90 tablet 3  . simvastatin (ZOCOR) 40 MG tablet Take 1 tablet (40 mg total) by mouth every evening. 30 tablet 6  . SYMBICORT 160-4.5 MCG/ACT inhaler INHALE 2 PUFFS INTO THE LUNGS EVERY 12 HOURS 10.2 g 0  . tiZANidine (ZANAFLEX) 4 MG tablet Take 4 mg by mouth 3 (three) times daily as needed for muscle spasms.  1   No current facility-administered medications for this  visit.    Allergies:  Flexeril [cyclobenzaprine] and Amoxicillin   Social History: The patient  reports that she has been smoking Cigarettes.  She started smoking about 43 years ago. She has a 84.00 pack-year smoking history. She has never used smokeless tobacco. She reports that she does not drink alcohol or use drugs.   Family History: The patient's family history includes Asthma in her mother; Cancer in her father; Cirrhosis in her maternal grandmother, mother, and sister; Diabetes in her brother, brother, mother, and sister; Emphysema in her father; Heart attack in her brother and father; Heart disease in her brother, brother, father, and mother; Seizures in her brother; Stroke in her mother.   ROS:  Please see the history of present illness. Otherwise, complete review of systems is positive for {NONE DEFAULTED:18576::"none"}.  All other systems are reviewed and negative.   Physical Exam: VS:  There were no vitals taken for this visit., BMI There is no height or weight on file to calculate BMI.  Wt Readings from Last 3 Encounters:  12/19/16 227 lb 6 oz (103.1 kg)  12/12/16 227 lb (103 kg)  11/19/16 224 lb (101.6 kg)    General: Overweight woman, appears comfortable at rest. HEENT: Conjunctiva and lids normal, oropharynx clear with poor dentition. Neck: Supple, no elevated JVP or carotid bruits, no thyromegaly. Lungs: Clear to auscultation, nonlabored breathing at rest. Cardiac: Regular rate and rhythm, no S3 or  significant systolic murmur, no pericardial rub. Abdomen: Soft, nontender, bowel sounds present. Extremities: No pitting edema, distal pulses 2+. Skin: Warm and dry. Scattered tattoos noted. Musculoskeletal: No kyphosis. Neuropsychiatric: Alert and oriented x3, affect grossly appropriate.  ECG: I personally reviewed the tracing from 07/01/2016 which showed an atrial paced rhythm with low voltage.  Recent Labwork: 07/01/2016: B Natriuretic Peptide 30.0 10/08/2016: Hemoglobin 11.7; Platelets 275.0 11/05/2016: ALT 37; AST 34; BUN 15; Creatinine, Ser 0.84; Potassium 4.3; Sodium 137; TSH 0.308     Component Value Date/Time   CHOL 186 11/05/2016 1300   CHOL 276 (H) 07/07/2015 1052   TRIG 130 11/05/2016 1300   TRIG 422 (H) 01/30/2015 1329   HDL 87 11/05/2016 1300   HDL 42 07/07/2015 1052   HDL 46 01/30/2015 1329   CHOLHDL 2.1 11/05/2016 1300   VLDL 26 11/05/2016 1300   LDLCALC 73 11/05/2016 1300   LDLCALC 162 (H) 07/07/2015 1052    Other Studies Reviewed Today:  Echocardiogram 11/26/2016: Study Conclusions  - Left ventricle: The cavity size was normal. Wall thickness was   increased in a pattern of mild LVH. Systolic function was normal.   The estimated ejection fraction was in the range of 60% to 65%.   Wall motion was normal; there were no regional wall motion   abnormalities. Left ventricular diastolic function parameters   were normal. - Aortic valve: Valve area (VTI): 2.58 cm^2. Valve area (Vmax): 2.8   cm^2. - Right atrium: RA lead is very mobile, consider correlating   positioning by chest xray - Atrial septum: No defect or patent foramen ovale was identified. - Pulmonary arteries: Systolic pressure was mildly increased. PA   peak pressure: 32 mm Hg (S). - Technically adequate study.  Assessment and Plan:   Current medicines were reviewed with the patient today.  No orders of the defined types were placed in this encounter.   Disposition:  Signed, Jonelle Sidle, MD, [facility] 12/31/2016 12:16 PM    [facility] [address] [place],  [place] [address] Phone: [phone]; Fax: [phone]

## 2016-12-31 NOTE — Telephone Encounter (Signed)
Done

## 2017-01-01 ENCOUNTER — Encounter: Payer: Self-pay | Admitting: Cardiology

## 2017-01-01 ENCOUNTER — Ambulatory Visit: Payer: Self-pay | Admitting: Cardiology

## 2017-01-02 ENCOUNTER — Ambulatory Visit: Payer: Self-pay | Admitting: Cardiology

## 2017-01-09 ENCOUNTER — Ambulatory Visit (HOSPITAL_COMMUNITY): Admission: RE | Admit: 2017-01-09 | Payer: Medicaid Other | Source: Ambulatory Visit

## 2017-01-20 ENCOUNTER — Other Ambulatory Visit (HOSPITAL_BASED_OUTPATIENT_CLINIC_OR_DEPARTMENT_OTHER): Payer: Self-pay

## 2017-01-20 DIAGNOSIS — G4733 Obstructive sleep apnea (adult) (pediatric): Secondary | ICD-10-CM

## 2017-01-23 ENCOUNTER — Telehealth: Payer: Self-pay | Admitting: Gastroenterology

## 2017-01-23 ENCOUNTER — Ambulatory Visit (HOSPITAL_COMMUNITY)
Admission: RE | Admit: 2017-01-23 | Discharge: 2017-01-23 | Disposition: A | Discharge status: Home / Self Care | Payer: Medicaid Other | Source: Ambulatory Visit | Attending: Gastroenterology | Admitting: Gastroenterology

## 2017-01-23 DIAGNOSIS — K5010 Crohn's disease of large intestine without complications: Secondary | ICD-10-CM | POA: Diagnosis not present

## 2017-01-23 DIAGNOSIS — K50119 Crohn's disease of large intestine with unspecified complications: Secondary | ICD-10-CM

## 2017-01-23 MED ORDER — SODIUM CHLORIDE 0.9 % IV SOLN
5.0000 mg/kg | Freq: Once | INTRAVENOUS | Status: AC
  Administered 2017-01-23: 09:00:00 500 mg via INTRAVENOUS
  Filled 2017-01-23: qty 50

## 2017-01-23 MED ORDER — DIPHENHYDRAMINE HCL 25 MG PO TABS
50.0000 mg | ORAL_TABLET | Freq: Once | ORAL | Status: DC
  Filled 2017-01-23: qty 2

## 2017-01-23 MED ORDER — SODIUM CHLORIDE 0.9 % IV SOLN
Freq: Once | INTRAVENOUS | Status: AC
  Administered 2017-01-23: 09:00:00 via INTRAVENOUS

## 2017-01-23 MED ORDER — ACETAMINOPHEN 325 MG PO TABS: 650.0000 mg | ORAL_TABLET | ORAL | Status: DC

## 2017-01-23 NOTE — Telephone Encounter (Signed)
I see Dr Lanetta Inch recent note re: symptoms and manometry results.  Sorry she is not feeling well. EGD normal.  Manometry normal.  SO even if eating/drinking causes chest pain and/or feelings of being "stuck" in esophagus, nothing is going to get stuck and she should try to have small amounts throughout the day.  Looks like SA was considering another med, but seems as if he needed to consult with her cardiologist about that.  I will not be at liberty to do that tomorrow, so it will have to wait until SA returns next week.

## 2017-01-23 NOTE — Telephone Encounter (Signed)
Routed to DOD, Dr. Adela Lank, patient reports that she has tried the peppermint supplement prior to meals and prn, this has not helped. Today she reports only being able to eat 1-2 bites of food, has difficulty with water going down for last 2 weeks. Please see manometry report from 4/9.

## 2017-01-24 NOTE — Telephone Encounter (Signed)
Called patient had to lvm, advised to eat/drink slowly. Will have Dr. Adela Lank address medication change with cardiologist when he gets back next week.

## 2017-01-28 ENCOUNTER — Other Ambulatory Visit: Payer: Self-pay | Admitting: "Endocrinology

## 2017-01-29 ENCOUNTER — Ambulatory Visit: Payer: Medicaid Other | Attending: Neurology | Admitting: Neurology

## 2017-01-29 ENCOUNTER — Encounter: Payer: Self-pay | Admitting: Cardiology

## 2017-01-29 DIAGNOSIS — Z7902 Long term (current) use of antithrombotics/antiplatelets: Secondary | ICD-10-CM | POA: Diagnosis not present

## 2017-01-29 DIAGNOSIS — Z794 Long term (current) use of insulin: Secondary | ICD-10-CM | POA: Diagnosis not present

## 2017-01-29 DIAGNOSIS — Z79899 Other long term (current) drug therapy: Secondary | ICD-10-CM | POA: Insufficient documentation

## 2017-01-29 DIAGNOSIS — G4733 Obstructive sleep apnea (adult) (pediatric): Secondary | ICD-10-CM

## 2017-01-29 DIAGNOSIS — Z7982 Long term (current) use of aspirin: Secondary | ICD-10-CM | POA: Diagnosis not present

## 2017-01-29 DIAGNOSIS — G4761 Periodic limb movement disorder: Secondary | ICD-10-CM | POA: Insufficient documentation

## 2017-01-29 DIAGNOSIS — R0683 Snoring: Secondary | ICD-10-CM | POA: Diagnosis not present

## 2017-01-29 NOTE — Progress Notes (Signed)
Cardiology Office Note  Date: 01/30/2017   ID: LUWANNA [name], DOB August 03, 1966, MRN [id number]  PCP: Meagan Hawking, PA-C  Primary Cardiologist: Meagan Dell, MD   Chief Complaint  Patient presents with  . Cardiac follow-up    History of Present Illness: [name] is a 51 y.o. female that I last saw in October 2017. She presented today with her uncle for a follow-up visit. States that she just completed a sleep study overnight and will be following up on the results with Dr. Gerilyn [name]. She states that leg edema has been recently well controlled on Lasix, tends to fluctuate. Follow-up echocardiogram in March revealed LVEF 60-65% with normal diastolic function.  I reviewed the chart, she has been undergoing gastroenterology evaluation, requested to consider a switch from Coreg to diltiazem for the possibility of esophageal spasm. Note by Dr. Adela [name] reviewed. Recent ECG showed mild gastritis, she also underwent empiric dilatation. I discussed this with her today, and I do not have a specific cardiac reason to disagree. She has normal LVEF and with pacemaker in place, I would not be worried about any substantial bradycardia.  She continues to follow with Dr. Ladona [name] in the device clinic, was seen in February of this year. She has a Environmental education officer in place with prior history of inappropriate sinus tachycardia status post sinus node modification and subsequent sinus node dysfunction. I discussed with her maintaining regular compliance with device follow-up.  Past Medical History:  Diagnosis Date  . Anxiety   . Arthritis   . Cardiac pacemaker in situ 2009   DDD [employer] -- [place] 60  . COPD with asthma (HCC)    GOLD 2-3 --  pulmologist (last visit 2011) Dr. Marchelle [name]  . Crohn's disease (HCC)    Large intestine  . Depression 2016   PTSD  . Essential hypertension   . Gastroparesis   . GERD (gastroesophageal reflux disease)   . History of hiatal hernia     . History of kidney stones   . History of stroke    Jun 2011 -- right hand weakness  . History of syncope   . Inappropriate sinus tachycardia    Sinus node modification 02-25-2003 by Dr. Lewayne [name]  . LLQ abdominal tenderness 10/20/2015  . OSA (obstructive sleep apnea)    Study done 2005 -- pt refused CPAP/previously was using nocturnal oxygen until one year ago pt states PCP is monitoring pt without  . Pelvic pain in female   . PTSD (post-traumatic stress disorder)   . Sinus node dysfunction (HCC)    Symptomatic bradycardia  . Type 2 diabetes mellitus (HCC)   . Wears glasses     Past Surgical History:  Procedure Laterality Date  . 24 HOUR PH STUDY N/A 12/30/2016   Procedure: 24 HOUR PH STUDY;  Surgeon: Meagan Frederick, MD;  Location: [facility];  Service: Gastroenterology;  Laterality: N/A;  . ABDOMINAL HYSTERECTOMY    . BILATERAL KNEE ARTHROSCOPY W/ CHONDROMALACIA PATELLA  bilateral ---- 12-26-2010; 08-14-2009;  04-25-2008;  03-09-2007   additional same surgery, Left knee 2005;  x2 2006 ---  Right knee 2005;  2006;  x2  2007  . CARDIAC CATHETERIZATION  05-08-2001  dr [name]   normal coronary arteries and LVF  . CARDIAC ELECTROPHYSIOLOGY STUDY W/  SINUS NODE MODIFICATION  02-25-2003  dr [name]  . CARDIAC PACEMAKER PLACEMENT  10-16-2007  dr [name]   DDD-- [facility] 60  . CARDIOVASCULAR STRESS TEST  08-02-2010  dr [name]   normal lexiscan study/  ef 59%  . CESAREAN SECTION  x2  . CHOLECYSTECTOMY  1994  . COLONOSCOPY WITH PROPOFOL N/A 09/27/2016   Procedure: COLONOSCOPY WITH PROPOFOL;  Surgeon: Meagan Frederick, MD;  Location: [facility];  Service: Gastroenterology;  Laterality: N/A;  . CYSTO/  URETEROSCOPIC STONE EXTRACTION  03/ 2005  . CYSTOSCOPY WITH HYDRODISTENSION AND BIOPSY N/A 05/23/2015   Procedure: CYSTOSCOPY/BIOPSY/HYDRODISTENSION;  Surgeon: Meagan Grand, MD;  Location: [facility];  Service: Urology;   Laterality: N/A;  . ESOPHAGEAL MANOMETRY N/A 12/30/2016   Procedure: ESOPHAGEAL MANOMETRY (EM);  Surgeon: Meagan Frederick, MD;  Location: [facility];  Service: Gastroenterology;  Laterality: N/A;  . ESOPHAGOGASTRODUODENOSCOPY (EGD) WITH PROPOFOL N/A 09/27/2016   Procedure: ESOPHAGOGASTRODUODENOSCOPY (EGD) WITH PROPOFOL;  Surgeon: Meagan Frederick, MD;  Location: [facility];  Service: Gastroenterology;  Laterality: N/A;  . TONSILLECTOMY  11-25-2002    Current Outpatient Prescriptions  Medication Sig Dispense Refill  . albuterol (PROVENTIL) (2.5 MG/3ML) 0.083% nebulizer solution Take 3 mLs (2.5 mg total) by nebulization every 6 (six) hours as needed for wheezing or shortness of breath. 75 mL 12  . aspirin EC 81 MG tablet Take 81 mg by mouth daily.    . BD PEN NEEDLE NANO U/F 32G X 4 MM MISC 1 each by Does not apply route 4 (four) times daily. 120 each 0  . carvedilol (COREG) 12.5 MG tablet Take 1 tablet (12.5 mg total) by mouth 2 (two) times daily with a meal. 60 tablet 0  . Cholecalciferol (VITAMIN D3) 5000 units CAPS Take 1 capsule (5,000 Units total) by mouth daily. 90 capsule 0  . clopidogrel (PLAVIX) 75 MG tablet TAKE 1 TABLET BY MOUTH EVERY DAY 30 tablet 1  . colestipol (COLESTID) 1 g tablet Take 1 tablet (1 g total) by mouth 2 (two) times daily. 90 tablet 3  . diclofenac sodium (VOLTAREN) 1 % GEL Apply 4 g topically 4 (four) times daily. 200 g 11  . dicyclomine (BENTYL) 20 MG tablet Take 1 tablet (20 mg total) by mouth every 8 (eight) hours as needed for spasms. 90 tablet 1  . FLUoxetine HCl 60 MG TABS Take 60 mg by mouth daily. 30 tablet 1  . furosemide (LASIX) 40 MG tablet Take 1 tablet (40 mg total) by mouth every morning. 30 tablet 6  . gemfibrozil (LOPID) 600 MG tablet Take 1 tablet (600 mg total) by mouth 2 (two) times daily before a meal. 60 tablet 3  . glucose blood (ACCU-CHEK GUIDE) test strip Use as instructed 4 x daily. E11.65 150 each 5  . inFLIXimab (REMICADE)  100 MG injection Inject 500 mg into the vein every 8 (eight) weeks. Needs loading dosage of 0,2,6 weeks then every 8 weeks thereafter 1 each 3  . insulin aspart (NOVOLOG FLEXPEN) 100 UNIT/ML FlexPen Inject 15-21 Units into the skin 3 (three) times daily with meals. 5 pen 2  . INVOKANA 100 MG TABS tablet TAKE 1 TABLET BY MOUTH DAILY BEFORE BREAKFAST. 30 tablet 2  . LEVEMIR FLEXTOUCH 100 UNIT/ML Pen INJECT 80 UNITS UNDER THE SKIN EVERY DAY AT 10 PM 30 mL 2  . losartan (COZAAR) 25 MG tablet Take 1 tablet (25 mg total) by mouth daily. 30 tablet 3  . LYRICA 150 MG capsule TK 1 C PO TID  0  . mesalamine (APRISO) 0.375 g 24 hr capsule Take 4 capsules (1.5 g total) by mouth every morning. 120 capsule 1  .  metFORMIN (GLUCOPHAGE) 1000 MG tablet TAKE 1 TABLET(1000 MG) BY MOUTH TWICE DAILY WITH A MEAL 60 tablet 3  . mirtazapine (REMERON) 15 MG tablet Take 1 tablet (15 mg total) by mouth at bedtime. 30 tablet 1  . nitroGLYCERIN (NITROSTAT) 0.4 MG SL tablet Place 1 tablet (0.4 mg total) under the tongue every 5 (five) minutes as needed for chest pain. 30 tablet 0  . NUCYNTA 75 MG tablet Take 75 mg by mouth 3 (three) times daily as needed for pain.  0  . OXYGEN Inhale 3 L/day into the lungs at bedtime.    . pantoprazole (PROTONIX) 40 MG tablet Take 1 tablet (40 mg total) by mouth 2 (two) times daily. 90 tablet 3  . simvastatin (ZOCOR) 40 MG tablet Take 1 tablet (40 mg total) by mouth every evening. 30 tablet 6  . SYMBICORT 160-4.5 MCG/ACT inhaler INHALE 2 PUFFS INTO THE LUNGS EVERY 12 HOURS 10.2 g 0  . tiZANidine (ZANAFLEX) 4 MG tablet Take 4 mg by mouth 3 (three) times daily as needed for muscle spasms.  1   No current facility-administered medications for this visit.    Allergies:  Flexeril [cyclobenzaprine] and Amoxicillin   Social History: The patient  reports that she has been smoking Cigarettes.  She started smoking about 44 years ago. She has a 84.00 pack-year smoking history. She has never used  smokeless tobacco. She reports that she does not drink alcohol or use drugs.   ROS:  Please see the history of present illness. Otherwise, complete review of systems is positive for chronic reflux and dysphagia.  All other systems are reviewed and negative.   Physical Exam: VS:  BP 108/66   Pulse 78   Ht 5\' 4"  (1.626 m)   Wt 220 lb (99.8 kg)   SpO2 98%   BMI 37.76 kg/m , BMI Body mass index is 37.76 kg/m.  Wt Readings from Last 3 Encounters:  01/30/17 220 lb (99.8 kg)  01/23/17 207 lb (93.9 kg)  12/19/16 227 lb 6 oz (103.1 kg)    General: Overweight woman, no distress. HEENT: Conjunctiva and lids normal, oropharynx clear with poor dentition. Neck: Supple, no elevated JVP or carotid bruits, no thyromegaly. Lungs: Clear to auscultation, nonlabored breathing at rest. Cardiac: Regular rate and rhythm, no S3 or significant systolic murmur, no pericardial rub. Abdomen: Soft, nontender, bowel sounds present. Extremities: Trace ankle edema, distal pulses 2+. Skin: Warm and dry. Multiple tattoos. Musculoskeletal: No kyphosis. Neuropsychiatric: Alert and oriented x3, affect grossly appropriate.  ECG: I personally reviewed the tracing from 07/01/2016 which showed an atrial paced rhythm with low voltage and poor R-wave progression.  Recent Labwork: 07/01/2016: B Natriuretic Peptide 30.0 11/05/2016: TSH 0.308 12/31/2016: ALT 14; AST 23; BUN 15; Creatinine, Ser 1.00; Hemoglobin 13.7; Platelets 257.0; Potassium 4.0; Sodium 134     Component Value Date/Time   CHOL 186 11/05/2016 1300   CHOL 276 (H) 07/07/2015 1052   TRIG 130 11/05/2016 1300   TRIG 422 (H) 01/30/2015 1329   HDL 87 11/05/2016 1300   HDL 42 07/07/2015 1052   HDL 46 01/30/2015 1329   CHOLHDL 2.1 11/05/2016 1300   VLDL 26 11/05/2016 1300   LDLCALC 73 11/05/2016 1300   LDLCALC 162 (H) 07/07/2015 1052    Other Studies Reviewed Today:  Echocardiogram 11/26/2016: Study Conclusions  - Left ventricle: The cavity size was  normal. Wall thickness was   increased in a pattern of mild LVH. Systolic function was normal.   The estimated  ejection fraction was in the range of 60% to 65%.   Wall motion was normal; there were no regional wall motion   abnormalities. Left ventricular diastolic function parameters   were normal. - Aortic valve: Valve area (VTI): 2.58 cm^2. Valve area (Vmax): 2.8   cm^2. - Right atrium: RA lead is very mobile, consider correlating   positioning by chest xray - Atrial septum: No defect or patent foramen ovale was identified. - Pulmonary arteries: Systolic pressure was mildly increased. PA   peak pressure: 32 mm Hg (S). - Technically adequate study.  Assessment and Plan:  1. History of inappropriate sinus tachycardia status post sinus node modification by Dr. Ladona [name] in 2004 and subsequent [facility] Scientific pacemaker placement due to symptomatic bradycardia. She has been clinically stable and I recommended that she keep follow-up in the device clinic. As noted above, I do not have a specific reason to disagree with switching from Coreg to diltiazem per GI recommendations in an attempt to address esophageal spasm concerns.  2. Patient with reflux and dysphagia, also concern about possible esophageal spasm. I reviewed the chart and recommendations by Dr. Adela [name] to consider switching from Coreg to short acting diltiazem in an effort to address esophageal spasm. I do not disagree with this from a cardiac perspective, and will defer to GI to make these adjustments so that they can follow the response and adjust calcium channel blocker as needed.  3. History of intermittent leg edema. She is responding well to Lasix. As noted above, LVEF is normal range and she has normal diastolic function.  4. History of essential hypertension, blood pressure is normal today. Recommended follow-up with PCP.  Current medicines were reviewed with the patient today.  Disposition: Follow-up in 6  months.  Signed, Jonelle Sidle, MD, [facility] 01/30/2017 10:08 AM    [facility] at [facility] [address], [place], [place] [address] Phone: [phone]; Fax: [phone]

## 2017-01-29 NOTE — Telephone Encounter (Signed)
[name] can you please touch base with this patient: - is she eating okay over the past 5 days? - prior EGD with dilation provided no benefit. Manometry showed otherwise normal study with increase in average DCI but did not meet criteria for nutcracker esophagus - I empirically tried her on peppermint supplement to treat component of spasm, sounds like it did not do anything - we could consider a trial of diltiazem for component of spasm, although not sure how much this would help based on her course to date. She would need to get approval from her cardiologist (on coreg) to see if switch to diltiazem 60mg  TID to QID would be possible Thanks

## 2017-01-29 NOTE — Telephone Encounter (Signed)
Thanks Raynelle Fanning. The other thing we could consider would be a barium swallow, which may help localize where her symptoms are coming from, ensure no oropharyngeal pathology. I don't see that this has been done yet, we can await her course. thanks

## 2017-01-29 NOTE — Telephone Encounter (Signed)
Spoke to patient, she is able to eat small portions. She is still getting choked on liquids, reminded her to do chin tuck when she swallows. She is scheduled for a sleep study tonight and will see her cardiologist tomorrow (Dr. Diona Browner). Asked patient to discuss medication changes with her cardiologist.

## 2017-01-30 ENCOUNTER — Telehealth: Payer: Self-pay | Admitting: Gastroenterology

## 2017-01-30 ENCOUNTER — Encounter: Payer: Self-pay | Admitting: Cardiology

## 2017-01-30 ENCOUNTER — Ambulatory Visit (INDEPENDENT_AMBULATORY_CARE_PROVIDER_SITE_OTHER): Payer: Medicaid Other | Admitting: Cardiology

## 2017-01-30 VITALS — BP 108/66 | HR 78 | Ht 64.0 in | Wt 220.0 lb

## 2017-01-30 DIAGNOSIS — K224 Dyskinesia of esophagus: Secondary | ICD-10-CM | POA: Diagnosis not present

## 2017-01-30 DIAGNOSIS — I495 Sick sinus syndrome: Secondary | ICD-10-CM | POA: Diagnosis not present

## 2017-01-30 DIAGNOSIS — I10 Essential (primary) hypertension: Secondary | ICD-10-CM | POA: Diagnosis not present

## 2017-01-30 DIAGNOSIS — Z950 Presence of cardiac pacemaker: Secondary | ICD-10-CM | POA: Diagnosis not present

## 2017-01-30 DIAGNOSIS — R600 Localized edema: Secondary | ICD-10-CM | POA: Diagnosis not present

## 2017-01-30 NOTE — Patient Instructions (Signed)

## 2017-01-30 NOTE — Telephone Encounter (Signed)
Got a message from Dr. Diona Browner who saw the patient today - he was okay with switching Coreg to diltiazem.   Raynelle Fanning can you please notify the patient and we can order diltiazem 60mg  (short acting form) TID to start to see if this helps, and stop Coreg while doing this. We are empirically treating for esophageal spasm given her symptoms and mildly elevated DCI based on manometry. She should try this for a few weeks. If no benefit and symptoms persist, would recommend barium swallow with pill study. Can you please let her know? Thanks

## 2017-01-31 ENCOUNTER — Other Ambulatory Visit: Payer: Self-pay

## 2017-01-31 MED ORDER — SIMVASTATIN 10 MG PO TABS: 10.0000 mg | ORAL_TABLET | Freq: Every day | ORAL | 0 refills | Status: DC

## 2017-01-31 MED ORDER — DILTIAZEM HCL 60 MG PO TABS: 60.0000 mg | ORAL_TABLET | Freq: Three times a day (TID) | ORAL | 0 refills | Status: DC

## 2017-01-31 NOTE — Telephone Encounter (Signed)
Spoke to patient, she is aware to not take her higher dose of Zocor. She understands to pick up both new Zocor 10 mg Rx and the diltiazem. She states she has not been on Coreg in a while, that her doctor took her off of it. Asked patient to call back in a few weeks to give an update on she is feeling.

## 2017-01-31 NOTE — Telephone Encounter (Signed)
I reviewed the drug insert. Diltiazem will increase concentration of Zocor, they recommend changing dose of zocor to 10mg  when used with diltiazem. Can you please change dose of zocor to 10mg  for her while she is on trial of diltiazem. thanks

## 2017-01-31 NOTE — Telephone Encounter (Signed)
Patient advised of change in her medication. Diltiazem came up with a warning when using Zocor. Please advise.

## 2017-02-02 NOTE — Procedures (Signed)
[facility] Christ Fullenwider A. Gerilyn Pilgrim, MD     [email or web address]             NOCTURNAL POLYSOMNOGRAPHY   LOCATION: [facility]   Patient Name: [name], [name] Date: 01/29/2017 Gender: Female D.O.B: September 25, 1965 Age (years): 50 Referring Provider: Beryle Beams MD, ABSM Height (inches): 64 Interpreting Physician: Beryle Beams MD, ABSM Weight (lbs): 202 RPSGT: Peak, Robert BMI: 35 MRN: [id number] Neck Size: 17.50 CLINICAL INFORMATION Sleep Study Type: NPSG  Indication for sleep study: OSA  Epworth Sleepiness Score: 0  SLEEP STUDY TECHNIQUE As per the AASM Manual for the Scoring of Sleep and Associated Events v2.3 (April 2016) with a hypopnea requiring 4% desaturations.  The channels recorded and monitored were frontal, central and occipital EEG, electrooculogram (EOG), submentalis EMG (chin), nasal and oral airflow, thoracic and abdominal wall motion, anterior tibialis EMG, snore microphone, electrocardiogram, and pulse oximetry.  MEDICATIONS Medications self-administered by patient taken the night of the study : LYRICA, ZOCOR, METFORMIN  Current Outpatient Prescriptions:  .  albuterol (PROVENTIL) (2.5 MG/3ML) 0.083% nebulizer solution, Take 3 mLs (2.5 mg total) by nebulization every 6 (six) hours as needed for wheezing or shortness of breath., Disp: 75 mL, Rfl: 12 .  aspirin EC 81 MG tablet, Take 81 mg by mouth daily., Disp: , Rfl:  .  BD PEN NEEDLE NANO U/F 32G X 4 MM MISC, 1 each by Does not apply route 4 (four) times daily., Disp: 120 each, Rfl: 0 .  carvedilol (COREG) 12.5 MG tablet, Take 1 tablet (12.5 mg total) by mouth 2 (two) times daily with a meal., Disp: 60 tablet, Rfl: 0 .  Cholecalciferol (VITAMIN D3) 5000 units CAPS, Take 1 capsule (5,000 Units total) by mouth daily., Disp: 90 capsule, Rfl: 0 .  clopidogrel (PLAVIX) 75 MG tablet, TAKE 1 TABLET BY MOUTH EVERY DAY, Disp: 30 tablet, Rfl: 1 .  colestipol (COLESTID) 1 g tablet, Take 1 tablet (1 g  total) by mouth 2 (two) times daily., Disp: 90 tablet, Rfl: 3 .  diclofenac sodium (VOLTAREN) 1 % GEL, Apply 4 g topically 4 (four) times daily., Disp: 200 g, Rfl: 11 .  dicyclomine (BENTYL) 20 MG tablet, Take 1 tablet (20 mg total) by mouth every 8 (eight) hours as needed for spasms., Disp: 90 tablet, Rfl: 1 .  diltiazem (CARDIZEM) 60 MG tablet, Take 1 tablet (60 mg total) by mouth 3 (three) times daily., Disp: 90 tablet, Rfl: 0 .  FLUoxetine HCl 60 MG TABS, Take 60 mg by mouth daily., Disp: 30 tablet, Rfl: 1 .  furosemide (LASIX) 40 MG tablet, Take 1 tablet (40 mg total) by mouth every morning., Disp: 30 tablet, Rfl: 6 .  gemfibrozil (LOPID) 600 MG tablet, Take 1 tablet (600 mg total) by mouth 2 (two) times daily before a meal., Disp: 60 tablet, Rfl: 3 .  glucose blood (ACCU-CHEK GUIDE) test strip, Use as instructed 4 x daily. E11.65, Disp: 150 each, Rfl: 5 .  inFLIXimab (REMICADE) 100 MG injection, Inject 500 mg into the vein every 8 (eight) weeks. Needs loading dosage of 0,2,6 weeks then every 8 weeks thereafter, Disp: 1 each, Rfl: 3 .  insulin aspart (NOVOLOG FLEXPEN) 100 UNIT/ML FlexPen, Inject 15-21 Units into the skin 3 (three) times daily with meals., Disp: 5 pen, Rfl: 2 .  INVOKANA 100 MG TABS tablet, TAKE 1 TABLET BY MOUTH DAILY BEFORE BREAKFAST., Disp: 30 tablet, Rfl: 2 .  LEVEMIR FLEXTOUCH 100 UNIT/ML Pen, INJECT 80 UNITS UNDER THE SKIN EVERY DAY AT  10 PM, Disp: 30 mL, Rfl: 2 .  losartan (COZAAR) 25 MG tablet, Take 1 tablet (25 mg total) by mouth daily., Disp: 30 tablet, Rfl: 3 .  LYRICA 150 MG capsule, TK 1 C PO TID, Disp: , Rfl: 0 .  mesalamine (APRISO) 0.375 g 24 hr capsule, Take 4 capsules (1.5 g total) by mouth every morning., Disp: 120 capsule, Rfl: 1 .  metFORMIN (GLUCOPHAGE) 1000 MG tablet, TAKE 1 TABLET(1000 MG) BY MOUTH TWICE DAILY WITH A MEAL, Disp: 60 tablet, Rfl: 3 .  mirtazapine (REMERON) 15 MG tablet, Take 1 tablet (15 mg total) by mouth at bedtime., Disp: 30 tablet, Rfl:  1 .  nitroGLYCERIN (NITROSTAT) 0.4 MG SL tablet, Place 1 tablet (0.4 mg total) under the tongue every 5 (five) minutes as needed for chest pain., Disp: 30 tablet, Rfl: 0 .  NUCYNTA 75 MG tablet, Take 75 mg by mouth 3 (three) times daily as needed for pain., Disp: , Rfl: 0 .  OXYGEN, Inhale 3 L/day into the lungs at bedtime., Disp: , Rfl:  .  pantoprazole (PROTONIX) 40 MG tablet, Take 1 tablet (40 mg total) by mouth 2 (two) times daily., Disp: 90 tablet, Rfl: 3 .  simvastatin (ZOCOR) 10 MG tablet, Take 1 tablet (10 mg total) by mouth daily., Disp: 30 tablet, Rfl: 0 .  simvastatin (ZOCOR) 40 MG tablet, Take 1 tablet (40 mg total) by mouth every evening., Disp: 30 tablet, Rfl: 6 .  SYMBICORT 160-4.5 MCG/ACT inhaler, INHALE 2 PUFFS INTO THE LUNGS EVERY 12 HOURS, Disp: 10.2 g, Rfl: 0 .  tiZANidine (ZANAFLEX) 4 MG tablet, Take 4 mg by mouth 3 (three) times daily as needed for muscle spasms., Disp: , Rfl: 1   SLEEP ARCHITECTURE The study was initiated at 9:53:32 PM and ended at 5:33:01 AM.  Sleep onset time was 19.5 minutes and the sleep efficiency was 92.7%. The total sleep time was 426.0 minutes.  Stage REM latency was 147.0 minutes.  The patient spent 1.76% of the night in stage N1 sleep, 81.34% in stage N2 sleep, 0.00% in stage N3 and 16.90% in REM.  Alpha intrusion was absent.  Supine sleep was 74.18%.  RESPIRATORY PARAMETERS The overall apnea/hypopnea index (AHI) was 3.0 per hour. There were 0 total apneas, including 0 obstructive, 0 central and 0 mixed apneas. There were 21 hypopneas and 3 RERAs.  The AHI during Stage REM sleep was 8.3 per hour.  AHI while supine was 3.2 per hour.  The mean oxygen saturation was 87.12%. The minimum SpO2 during sleep was 78.00%.  Moderate snoring was noted during this study.  CARDIAC DATA The 2 lead EKG demonstrated sinus rhythm. The mean heart rate was 65.05 beats per minute. Other EKG findings include: None. LEG MOVEMENT DATA The total PLMS  were 144 with a resulting PLMS index of 20.28. Associated arousal with leg movement index was 0.4.  IMPRESSIONS - Moderate periodic limb movements of sleep occurred during the study. No significant associated arousals. - Absent slow wave sleep is noted.   Argie Ramming, MD Diplomate, American Board of Sleep Medicine.  ELECTRONICALLY SIGNED ON:  02/02/2017, 9:58 PM [facility] PH: [phone]   FX: [phone] ACCREDITED BY THE AMERICAN ACADEMY OF SLEEP MEDICINE

## 2017-02-03 ENCOUNTER — Ambulatory Visit: Payer: Medicaid Other | Admitting: Physician Assistant

## 2017-02-05 ENCOUNTER — Telehealth: Payer: Self-pay | Admitting: Gastroenterology

## 2017-02-05 NOTE — Telephone Encounter (Signed)
Patient has been constipated for last 5 days, she tried one dose of Miralax last night and this morning with no success. Please advise.

## 2017-02-05 NOTE — Telephone Encounter (Signed)
Left detailed message for patient with Dr. Lanetta Inch recommendations. Asked that if after a couple days this did not help she needs to call back.

## 2017-02-05 NOTE — Telephone Encounter (Signed)
She can take Miralax double dose twice daily until she starts having goal BM once daily. If this is too strong for her can reduce to single dose twice daily, titrate as needed. If no success with this regimen she can call us back in a few days. thanks

## 2017-02-06 ENCOUNTER — Emergency Department (HOSPITAL_COMMUNITY)
Admission: EM | Admit: 2017-02-06 | Discharge: 2017-02-06 | Discharge status: Against Medical Advice | Payer: Medicaid Other | Attending: Emergency Medicine | Admitting: Emergency Medicine

## 2017-02-06 ENCOUNTER — Encounter (HOSPITAL_COMMUNITY): Payer: Self-pay

## 2017-02-06 ENCOUNTER — Emergency Department (HOSPITAL_COMMUNITY): Payer: Medicaid Other

## 2017-02-06 DIAGNOSIS — Z794 Long term (current) use of insulin: Secondary | ICD-10-CM | POA: Diagnosis not present

## 2017-02-06 DIAGNOSIS — R112 Nausea with vomiting, unspecified: Secondary | ICD-10-CM | POA: Diagnosis present

## 2017-02-06 DIAGNOSIS — I10 Essential (primary) hypertension: Secondary | ICD-10-CM | POA: Diagnosis not present

## 2017-02-06 DIAGNOSIS — E860 Dehydration: Secondary | ICD-10-CM | POA: Insufficient documentation

## 2017-02-06 DIAGNOSIS — J449 Chronic obstructive pulmonary disease, unspecified: Secondary | ICD-10-CM | POA: Diagnosis not present

## 2017-02-06 DIAGNOSIS — F17210 Nicotine dependence, cigarettes, uncomplicated: Secondary | ICD-10-CM | POA: Insufficient documentation

## 2017-02-06 DIAGNOSIS — J45909 Unspecified asthma, uncomplicated: Secondary | ICD-10-CM | POA: Insufficient documentation

## 2017-02-06 DIAGNOSIS — Z7982 Long term (current) use of aspirin: Secondary | ICD-10-CM | POA: Diagnosis not present

## 2017-02-06 DIAGNOSIS — Z79899 Other long term (current) drug therapy: Secondary | ICD-10-CM | POA: Diagnosis not present

## 2017-02-06 DIAGNOSIS — E119 Type 2 diabetes mellitus without complications: Secondary | ICD-10-CM | POA: Insufficient documentation

## 2017-02-06 LAB — COMPREHENSIVE METABOLIC PANEL
ALK PHOS: 107 U/L (ref 38–126)
ALT: 57 U/L — AB (ref 14–54)
ANION GAP: 16 — AB (ref 5–15)
AST: 130 U/L — ABNORMAL HIGH (ref 15–41)
Albumin: 4.2 g/dL (ref 3.5–5.0)
BILIRUBIN TOTAL: 0.7 mg/dL (ref 0.3–1.2)
BUN: 22 mg/dL — ABNORMAL HIGH (ref 6–20)
CALCIUM: 9.9 mg/dL (ref 8.9–10.3)
CO2: 20 mmol/L — AB (ref 22–32)
CREATININE: 0.9 mg/dL (ref 0.44–1.00)
Chloride: 97 mmol/L — ABNORMAL LOW (ref 101–111)
Glucose, Bld: 359 mg/dL — ABNORMAL HIGH (ref 65–99)
Potassium: 3.4 mmol/L — ABNORMAL LOW (ref 3.5–5.1)
SODIUM: 133 mmol/L — AB (ref 135–145)
TOTAL PROTEIN: 8.1 g/dL (ref 6.5–8.1)

## 2017-02-06 LAB — CBC
HCT: 44.6 % (ref 36.0–46.0)
HEMOGLOBIN: 15 g/dL (ref 12.0–15.0)
MCH: 28.7 pg (ref 26.0–34.0)
MCHC: 33.6 g/dL (ref 30.0–36.0)
MCV: 85.4 fL (ref 78.0–100.0)
PLATELETS: 181 10*3/uL (ref 150–400)
RBC: 5.22 MIL/uL — AB (ref 3.87–5.11)
RDW: 15.7 % — ABNORMAL HIGH (ref 11.5–15.5)
WBC: 8.3 10*3/uL (ref 4.0–10.5)

## 2017-02-06 LAB — LIPASE, BLOOD: Lipase: 28 U/L (ref 11–51)

## 2017-02-06 LAB — TROPONIN I

## 2017-02-06 MED ORDER — SODIUM CHLORIDE 0.9 % IV BOLUS (SEPSIS)
1000.0000 mL | Freq: Once | INTRAVENOUS | Status: AC
  Administered 2017-02-06: 1000 mL via INTRAVENOUS

## 2017-02-06 MED ORDER — ONDANSETRON HCL 4 MG/2ML IJ SOLN
4.0000 mg | Freq: Once | INTRAMUSCULAR | Status: AC
  Administered 2017-02-06: 4 mg via INTRAVENOUS
  Filled 2017-02-06: qty 2

## 2017-02-06 NOTE — ED Provider Notes (Signed)
[facility] Provider Note   CSN: [id number] Arrival date & time: 02/06/17  1940     History   Chief Complaint Chief Complaint  Patient presents with  . Fatigue  . Emesis    HPI [name] is a 51 y.o. female.  The history is provided by the patient.  Emesis   This is a new problem. The current episode started 2 days ago. The problem occurs 2 to 4 times per day. The problem has not changed since onset.The emesis has an appearance of stomach contents. There has been no fever. Associated symptoms include diarrhea. Pertinent negatives include no abdominal pain, no arthralgias, no chills, no cough, no fever, no headaches, no myalgias, no sweats and no URI.   51 year old female who presents with nausea, vomiting, and generalized weakness. She has a history of GERD, gastroparesis, type 2 diabetes, Crohn's colitis on Remicade, and multiple prior abdominal surgeries. States that 2 days ago she began to have 3-4 episodes of nonbloody nonbilious emesis per day. Had decreased appetite and inability to tolerate by mouth intake. As a result has been feeling very fatigued and weak. States that she has been feeling constipated, and was started on a medication by her doctor to have a bowel movement. She did have episode of loose stools today that was nonbloody. She denies any abdominal pain, chest pain or difficulty breathing, headaches, vision or speech changes, numbness or weakness, dysuria or urinary frequency, fevers or chills. Past Medical History:  Diagnosis Date  . Anxiety   . Arthritis   . Cardiac pacemaker in situ 2009   DDD [employer] -- [place] 60  . COPD with asthma (HCC)    GOLD 2-3 --  pulmologist (last visit 2011) Dr. Marchelle Gearing  . Crohn's disease (HCC)    Large intestine  . Depression 2016   PTSD  . Essential hypertension   . Gastroparesis   . GERD (gastroesophageal reflux disease)   . History of hiatal hernia   . History of kidney stones   . History of  stroke    Jun 2011 -- right hand weakness  . History of syncope   . Inappropriate sinus tachycardia    Sinus node modification 02-25-2003 by Dr. Lewayne Bunting  . LLQ abdominal tenderness 10/20/2015  . OSA (obstructive sleep apnea)    Study done 2005 -- pt refused CPAP/previously was using nocturnal oxygen until one year ago pt states PCP is monitoring pt without  . Pelvic pain in female   . PTSD (post-traumatic stress disorder)   . Sinus node dysfunction (HCC)    Symptomatic bradycardia  . Type 2 diabetes mellitus (HCC)   . Wears glasses     Patient Active Problem List   Diagnosis Date Noted  . Vitamin D deficiency 11/19/2016  . Moderate episode of recurrent major depressive disorder (HCC) 11/01/2016  . Chronic diarrhea   . Dysphagia   . PTSD (post-traumatic stress disorder) 08/29/2016  . Acute bronchitis 12/27/2015  . Upper airway cough syndrome 10/24/2015  . Morbid obesity (HCC) 10/24/2015  . Essential hypertension 10/24/2015  . LLQ abdominal tenderness 10/20/2015  . Diabetic neuropathy (HCC) 08/02/2015  . Depression 07/04/2015  . COPD GOLD 0 / still smoking  07/04/2015  . OSA (obstructive sleep apnea) 07/04/2015  . Other specified cardiac dysrhythmias(427.89)   . DM type 2 causing vascular disease (HCC) 08/22/2010  . MIXED HYPERLIPIDEMIA 11/20/2009  . Current smoker 11/03/2009  . IBS 10/23/2009  . CARDIAC PACEMAKER IN SITU 08/29/2009  .  ESOPHAGEAL STRICTURE 06/28/2009  . ESOPHAGEAL REFLUX 03/16/2009  . GASTROPARESIS 03/16/2009    Past Surgical History:  Procedure Laterality Date  . 24 HOUR PH STUDY N/A 12/30/2016   Procedure: 24 HOUR PH STUDY;  Surgeon: Ruffin Frederick, MD;  Location: [facility];  Service: Gastroenterology;  Laterality: N/A;  . ABDOMINAL HYSTERECTOMY    . BILATERAL KNEE ARTHROSCOPY W/ CHONDROMALACIA PATELLA  bilateral ---- 12-26-2010; 08-14-2009;  04-25-2008;  03-09-2007   additional same surgery, Left knee 2005;  x2 2006 ---  Right knee 2005;   2006;  x2  2007  . CARDIAC CATHETERIZATION  05-08-2001  dr Nicki Guadalajara   normal coronary arteries and LVF  . CARDIAC ELECTROPHYSIOLOGY STUDY W/  SINUS NODE MODIFICATION  02-25-2003  dr Sharlot Gowda taylor  . CARDIAC PACEMAKER PLACEMENT  10-16-2007  dr Lewayne Bunting   DDD-- [facility] 60  . CARDIOVASCULAR STRESS TEST  08-02-2010  dr Diona Browner   normal lexiscan study/  ef 59%  . CESAREAN SECTION  x2  . CHOLECYSTECTOMY  1994  . COLONOSCOPY WITH PROPOFOL N/A 09/27/2016   Procedure: COLONOSCOPY WITH PROPOFOL;  Surgeon: Ruffin Frederick, MD;  Location: [facility];  Service: Gastroenterology;  Laterality: N/A;  . CYSTO/  URETEROSCOPIC STONE EXTRACTION  03/ 2005  . CYSTOSCOPY WITH HYDRODISTENSION AND BIOPSY N/A 05/23/2015   Procedure: CYSTOSCOPY/BIOPSY/HYDRODISTENSION;  Surgeon: Su Grand, MD;  Location: [facility];  Service: Urology;  Laterality: N/A;  . ESOPHAGEAL MANOMETRY N/A 12/30/2016   Procedure: ESOPHAGEAL MANOMETRY (EM);  Surgeon: Ruffin Frederick, MD;  Location: [facility];  Service: Gastroenterology;  Laterality: N/A;  . ESOPHAGOGASTRODUODENOSCOPY (EGD) WITH PROPOFOL N/A 09/27/2016   Procedure: ESOPHAGOGASTRODUODENOSCOPY (EGD) WITH PROPOFOL;  Surgeon: Ruffin Frederick, MD;  Location: [facility];  Service: Gastroenterology;  Laterality: N/A;  . TONSILLECTOMY  11-25-2002    OB History    Gravida Para Term Preterm AB Living   5 2     3 2    SAB TAB Ectopic Multiple Live Births   3               Home Medications    Prior to Admission medications   Medication Sig Start Date End Date Taking? Authorizing Provider  albuterol (PROVENTIL) (2.5 MG/3ML) 0.083% nebulizer solution Take 3 mLs (2.5 mg total) by nebulization every 6 (six) hours as needed for wheezing or shortness of breath. 11/09/15   Dettinger, Elige Radon, MD  aspirin EC 81 MG tablet Take 81 mg by mouth daily.    [provider]  BD PEN NEEDLE NANO U/F 32G X 4 MM MISC 1 each by  Does not apply route 4 (four) times daily. 06/18/16   Jacquelin Hawking, PA-C  carvedilol (COREG) 12.5 MG tablet Take 1 tablet (12.5 mg total) by mouth 2 (two) times daily with a meal. 06/06/16   Jacquelin Hawking, PA-C  Cholecalciferol (VITAMIN D3) 5000 units CAPS Take 1 capsule (5,000 Units total) by mouth daily. 11/19/16   Roma Kayser, MD  clopidogrel (PLAVIX) 75 MG tablet TAKE 1 TABLET BY MOUTH EVERY DAY 07/03/16   Jacquelin Hawking, PA-C  colestipol (COLESTID) 1 g tablet Take 1 tablet (1 g total) by mouth 2 (two) times daily. 08/20/16   Armbruster, Reeves Forth, MD  diclofenac sodium (VOLTAREN) 1 % GEL Apply 4 g topically 4 (four) times daily. 11/09/15   Dettinger, Elige Radon, MD  dicyclomine (BENTYL) 20 MG tablet Take 1 tablet (20 mg total) by mouth every 8 (eight) hours as needed for  spasms. 12/19/16   Armbruster, Reeves Forth, MD  diltiazem (CARDIZEM) 60 MG tablet Take 1 tablet (60 mg total) by mouth 3 (three) times daily. 01/31/17   Armbruster, Reeves Forth, MD  FLUoxetine HCl 60 MG TABS Take 60 mg by mouth daily. 11/26/16   Neysa Hotter, MD  furosemide (LASIX) 40 MG tablet Take 1 tablet (40 mg total) by mouth every morning. 11/22/16   Jonelle Sidle, MD  gemfibrozil (LOPID) 600 MG tablet Take 1 tablet (600 mg total) by mouth 2 (two) times daily before a meal. 08/08/16   Nida, Denman George, MD  glucose blood (ACCU-CHEK GUIDE) test strip Use as instructed 4 x daily. E11.65 08/06/16   Roma Kayser, MD  inFLIXimab (REMICADE) 100 MG injection Inject 500 mg into the vein every 8 (eight) weeks. Needs loading dosage of 0,2,6 weeks then every 8 weeks thereafter 10/22/16   Ruffin Frederick, MD  insulin aspart (NOVOLOG FLEXPEN) 100 UNIT/ML FlexPen Inject 15-21 Units into the skin 3 (three) times daily with meals. 08/12/16   Roma Kayser, MD  INVOKANA 100 MG TABS tablet TAKE 1 TABLET BY MOUTH DAILY BEFORE BREAKFAST. 01/28/17   Roma Kayser, MD  LEVEMIR FLEXTOUCH 100  UNIT/ML Pen INJECT 80 UNITS UNDER THE SKIN EVERY DAY AT 10 PM 11/20/16   Roma Kayser, MD  losartan (COZAAR) 25 MG tablet Take 1 tablet (25 mg total) by mouth daily. 08/05/16   Roma Kayser, MD  LYRICA 150 MG capsule TK 1 C PO TID 10/23/16   [provider]  mesalamine (APRISO) 0.375 g 24 hr capsule Take 4 capsules (1.5 g total) by mouth every morning. 10/30/16   Ruffin Frederick, MD  metFORMIN (GLUCOPHAGE) 1000 MG tablet TAKE 1 TABLET(1000 MG) BY MOUTH TWICE DAILY WITH A MEAL 08/05/16   Jacquelin Hawking, PA-C  mirtazapine (REMERON) 15 MG tablet Take 1 tablet (15 mg total) by mouth at bedtime. 11/26/16   Neysa Hotter, MD  nitroGLYCERIN (NITROSTAT) 0.4 MG SL tablet Place 1 tablet (0.4 mg total) under the tongue every 5 (five) minutes as needed for chest pain. 04/22/16   Devoria Albe, MD  NUCYNTA 75 MG tablet Take 75 mg by mouth 3 (three) times daily as needed for pain. 09/04/16   [provider]  OXYGEN Inhale 3 L/day into the lungs at bedtime.    [provider]  pantoprazole (PROTONIX) 40 MG tablet Take 1 tablet (40 mg total) by mouth 2 (two) times daily. 08/20/16   Armbruster, Reeves Forth, MD  simvastatin (ZOCOR) 10 MG tablet Take 1 tablet (10 mg total) by mouth daily. 01/31/17   Armbruster, Reeves Forth, MD  simvastatin (ZOCOR) 40 MG tablet Take 1 tablet (40 mg total) by mouth every evening. 11/06/16   Jonelle Sidle, MD  SYMBICORT 160-4.5 MCG/ACT inhaler INHALE 2 PUFFS INTO THE LUNGS EVERY 12 HOURS 08/05/16   Jacquelin Hawking, PA-C  tiZANidine (ZANAFLEX) 4 MG tablet Take 4 mg by mouth 3 (three) times daily as needed for muscle spasms. 09/03/16   [provider]    Family History Family History  Problem Relation Age of Onset  . Diabetes Mother   . Asthma Mother   . Heart disease Mother   . Cirrhosis Mother   . Stroke Mother   . Heart disease Father        Deceased. MI. Mother, 2 brothers, sister, nephew also have heart disease  .  Emphysema Father        Died  of it. Was pt of Dr. Sherene Sires   . Heart attack Father   . Cancer Father        ? type  . Diabetes Brother   . Heart disease Brother   . Diabetes Sister   . Cirrhosis Sister   . Cirrhosis Maternal Grandmother   . Diabetes Brother   . Heart attack Brother   . Heart disease Brother   . Seizures Brother   . Colon cancer Neg Hx   . Stomach cancer Neg Hx   . Esophageal cancer Neg Hx   . Rectal cancer Neg Hx   . Liver cancer Neg Hx     Social History Social History  Substance Use Topics  . Smoking status: Current Every Day Smoker    Packs/day: 2.00    Years: 42.00    Types: Cigarettes    Start date: 02/02/1973  . Smokeless tobacco: Never Used     Comment: smoke about 2 packs because I recently lost my mom.  . Alcohol use No     Comment: 08-29-2016 per pt Occas.  on special occasions     Allergies   Flexeril [cyclobenzaprine] and Amoxicillin   Review of Systems Review of Systems  Constitutional: Negative for chills and fever.  HENT: Negative for congestion.   Respiratory: Negative for cough.   Cardiovascular: Negative for chest pain.  Gastrointestinal: Positive for diarrhea and vomiting. Negative for abdominal pain.  Musculoskeletal: Negative for arthralgias and myalgias.  Allergic/Immunologic: Positive for immunocompromised state (remicade infusion).  Neurological: Negative for headaches.  Hematological: Does not bruise/bleed easily.  All other systems reviewed and are negative.    Physical Exam Updated Vital Signs BP (!) 146/106 (BP Location: Left Arm)   Pulse 94   Temp 98 F (36.7 C) (Oral)   Resp 18   Ht 5\' 4"  (1.626 m)   Wt 212 lb (96.2 kg)   SpO2 98%   BMI 36.39 kg/m   Physical Exam Physical Exam  Nursing note and vitals reviewed. Constitutional: Well developed, well nourished, non-toxic, and in no acute distress Head: Normocephalic and atraumatic.  Mouth/Throat: Oropharynx is clear and moist.  Neck: Normal range of  motion. Neck supple.  Cardiovascular: Normal rate and regular rhythm.   Pulmonary/Chest: Effort normal and breath sounds normal.  Abdominal: Soft. There is no tenderness. There is no rebound and no guarding.  Musculoskeletal: Normal range of motion.  Neurological: Alert, no facial droop, fluent speech, moves all extremities symmetrically Skin: Skin is warm and dry.  Psychiatric: Cooperative   ED Treatments / Results  Labs (all labs ordered are listed, but only abnormal results are displayed) Labs Reviewed  COMPREHENSIVE METABOLIC PANEL - Abnormal; Notable for the following:       Result Value   Sodium 133 (*)    Potassium 3.4 (*)    Chloride 97 (*)    CO2 20 (*)    Glucose, Bld 359 (*)    BUN 22 (*)    AST 130 (*)    ALT 57 (*)    Anion gap 16 (*)    All other components within normal limits  CBC - Abnormal; Notable for the following:    RBC 5.22 (*)    RDW 15.7 (*)    All other components within normal limits  LIPASE, BLOOD  TROPONIN I  URINALYSIS, ROUTINE W REFLEX MICROSCOPIC    EKG  EKG Interpretation  Date/Time:  Thursday Feb 06 2017 20:17:57 EDT Ventricular Rate:  88 PR Interval:  QRS Duration: 105 QT Interval:  420 QTC Calculation: 509 R Axis:   1 Text Interpretation:  Sinus rhythm Anterior infarct, old no acute changes  Confirmed by Caven Perine MD, Mattilynn Forrer [phone]) on 02/06/2017 9:26:48 PM       Radiology Dg Abdomen Acute W/chest  Result Date: 02/06/2017 CLINICAL DATA:  Constipation.  Nausea and vomiting for 2 days. EXAM: DG ABDOMEN ACUTE W/ 1V CHEST COMPARISON:  Chest radiographs 04/21/2016.  CT 03/30/2015 FINDINGS: Left-sided pacemaker remains in place, leads projecting over the right atrium and ventricle. Normal heart size and mediastinal contours. Mild atherosclerosis of the aortic arch. No consolidation, pleural fluid or pneumothorax. Normal bowel gas pattern. No dilated bowel loops to suggest obstruction. Moderate stool in the ascending and proximal transverse  colon. Small volume of stool in the distal colon. No evidence of free air. No abnormal gastric distension. Cholecystectomy clips in the right upper quadrant. No radiopaque calculi, pelvic phleboliths are unchanged from CT. No acute osseous abnormality. Mild broad-based leftward curvature of the lower lumbar spine. IMPRESSION: 1. Normal bowel gas pattern. Moderate stool in the proximal colon. No evidence of obstruction or free air. 2. Clear lungs. Electronically Signed   By: Rubye Oaks M.D.   On: 02/06/2017 21:03    Procedures Procedures (including critical care time)  Medications Ordered in ED Medications  ondansetron [facility]) injection 4 mg (4 mg Intravenous Given 02/06/17 2028)  sodium chloride 0.9 % bolus 1,000 mL (0 mLs Intravenous Stopped 02/06/17 2141)     Initial Impression / Assessment and Plan / ED Course  I have reviewed the triage vital signs and the nursing notes.  Pertinent labs & imaging results that were available during my care of the patient were reviewed by me and considered in my medical decision making (see chart for details).     51 year old female who presents with nausea, vomiting and fatigue. She is nontoxic in no acute distress. Vital signs are stable. Her abdomen is soft and benign. Does have history of gastroparesis and chart review, this may be similar. Blood work does show hyperglycemia of 350 with mild anion gap of 16 and bicarbonate 20. With her nausea and vomiting this may also be dehydration rather than mild DKA. EKG is nonischemic and troponin is normal, and a do not feel that this is his atypical presentation for ACS at this time. An IV fluids and antibiotics and feels improved. Drinking Sprite and eating crackers, and feels comfortable going home. Initial plan was to recheck basic metabolic panel after IV fluids to make sure her Is resolving and bicarbonate improving and checking UA to evaluate for urine ketones. However patient eloped prior to all this  being performed.  Final Clinical Impressions(s) / ED Diagnoses   Final diagnoses:  Dehydration  Non-intractable vomiting with nausea, unspecified vomiting type    New Prescriptions Discharge Medication List as of 02/06/2017 10:19 PM       Lavera Guise, MD 02/06/17 2227

## 2017-02-06 NOTE — ED Triage Notes (Signed)
Reports of vomiting x2 days with generalized weakness. Denies pain.

## 2017-02-06 NOTE — ED Notes (Signed)
Pt states she does not need to urinate at this time  ?

## 2017-02-19 ENCOUNTER — Other Ambulatory Visit: Payer: Self-pay

## 2017-02-19 ENCOUNTER — Other Ambulatory Visit (HOSPITAL_COMMUNITY): Payer: Self-pay | Admitting: *Deleted

## 2017-02-20 ENCOUNTER — Ambulatory Visit: Payer: Medicaid Other | Admitting: "Endocrinology

## 2017-02-20 ENCOUNTER — Ambulatory Visit (HOSPITAL_COMMUNITY): Admission: RE | Admit: 2017-02-20 | Payer: Medicaid Other | Source: Ambulatory Visit

## 2017-02-20 ENCOUNTER — Encounter: Payer: Self-pay | Admitting: "Endocrinology

## 2017-02-22 NOTE — Addendum Note (Signed)
Addendum  created 02/22/17 1005 by Kimaya Whitlatch, MD   Sign clinical note    

## 2017-02-22 NOTE — Addendum Note (Signed)
Addendum  created 02/22/17 0759 by Threasa Kinch, MD   Sign clinical note    

## 2017-02-24 ENCOUNTER — Telehealth: Payer: Self-pay | Admitting: Gastroenterology

## 2017-02-24 NOTE — Telephone Encounter (Signed)
I called the patient and asked how she was doing and asked why she wanted to transfer. She states she was happy with her care at [facility] but lives much closer to [facility] and the commute for her care and infusions would be much easier there, she doesn't want to spend money on gas driving to [employer]. She thinks her Crohn's had been doing better on her regimen, we were going to follow up with a colonoscopy at some point to assess for mucosal healing. She missed her most recent Remicade infusion and I asked that she schedule this as soon as she can either with [place] or her new provider, ensure she doesn't lose response due to missing doses / development of immunogenicity. She agreed. She will call Jeani Hawking to make an appointment, she can follow up with [place] as needed otherwise.

## 2017-02-24 NOTE — Telephone Encounter (Signed)
FYI, I see she No Showed for her last Remicade infusion too.

## 2017-02-27 ENCOUNTER — Ambulatory Visit: Payer: Medicaid Other | Admitting: Physician Assistant

## 2017-02-27 ENCOUNTER — Ambulatory Visit (INDEPENDENT_AMBULATORY_CARE_PROVIDER_SITE_OTHER): Payer: Medicaid Other | Admitting: *Deleted

## 2017-02-27 DIAGNOSIS — I495 Sick sinus syndrome: Secondary | ICD-10-CM

## 2017-02-27 MED ORDER — CLOPIDOGREL BISULFATE 75 MG PO TABS: 75.0000 mg | ORAL_TABLET | Freq: Every day | ORAL | 6 refills | Status: AC

## 2017-02-28 ENCOUNTER — Other Ambulatory Visit: Payer: Self-pay | Admitting: "Endocrinology

## 2017-02-28 LAB — CUP PACEART INCLINIC DEVICE CHECK
Brady Statistic AP VS Percent: 25 %
Brady Statistic AS VS Percent: 74 %
Implantable Lead Implant Date: 20090123
Implantable Lead Location: 753859
Implantable Lead Location: 753860
Implantable Lead Model: 5076
Lead Channel Impedance Value: 770 Ohm
Lead Channel Pacing Threshold Amplitude: 0.6 V
Lead Channel Pacing Threshold Pulse Width: 0.4 ms
Lead Channel Sensing Intrinsic Amplitude: 1.5 mV
Lead Channel Sensing Intrinsic Amplitude: 11.6 mV
Lead Channel Setting Pacing Amplitude: 2 V
Lead Channel Setting Sensing Sensitivity: 2.5 mV
MDC IDC LEAD IMPLANT DT: 20090123
MDC IDC MSMT BATTERY REMAINING LONGEVITY: 12 mo
MDC IDC MSMT LEADCHNL RA IMPEDANCE VALUE: 400 Ohm
MDC IDC MSMT LEADCHNL RV PACING THRESHOLD AMPLITUDE: 0.5 V
MDC IDC MSMT LEADCHNL RV PACING THRESHOLD PULSEWIDTH: 0.4 ms
MDC IDC PG IMPLANT DT: 20090123
MDC IDC SESS DTM: 20180608163703
MDC IDC SET LEADCHNL RA PACING AMPLITUDE: 2 V
MDC IDC SET LEADCHNL RV PACING PULSEWIDTH: 0.4 ms
MDC IDC STAT BRADY AP VP PERCENT: 0 %
MDC IDC STAT BRADY AS VP PERCENT: 0 %
Pulse Gen Serial Number: 573704

## 2017-02-28 NOTE — Progress Notes (Signed)
Pacemaker check in clinic. Normal device function. Thresholds, sensing, impedances consistent with previous measurements. Device programmed to maximize longevity. No mode switch episodes. (1) high ventricular rate episode noted, SVT. Device programmed at appropriate safety margins. Histogram distribution appropriate for patient activity level. Device programmed to optimize intrinsic conduction. Estimated longevity 1 year. Patient will follow up with DC/R in 3 months.

## 2017-03-04 ENCOUNTER — Other Ambulatory Visit (HOSPITAL_COMMUNITY): Payer: Self-pay | Admitting: *Deleted

## 2017-03-05 ENCOUNTER — Ambulatory Visit (HOSPITAL_COMMUNITY)
Admission: RE | Admit: 2017-03-05 | Discharge: 2017-03-05 | Disposition: A | Discharge status: Home / Self Care | Payer: Medicaid Other | Source: Ambulatory Visit | Attending: Gastroenterology | Admitting: Gastroenterology

## 2017-03-05 DIAGNOSIS — K5010 Crohn's disease of large intestine without complications: Secondary | ICD-10-CM | POA: Diagnosis not present

## 2017-03-05 MED ORDER — SODIUM CHLORIDE 0.9 % IV SOLN
INTRAVENOUS | Status: DC
  Administered 2017-03-05: 10:00:00 via INTRAVENOUS

## 2017-03-05 MED ORDER — SODIUM CHLORIDE 0.9 % IV SOLN
5.0000 mg/kg | INTRAVENOUS | Status: DC
  Administered 2017-03-05: 10:00:00 500 mg via INTRAVENOUS
  Filled 2017-03-05: qty 50

## 2017-03-05 MED ORDER — DIPHENHYDRAMINE HCL 25 MG PO CAPS
ORAL_CAPSULE | ORAL | Status: AC
  Filled 2017-03-05: qty 2

## 2017-03-05 MED ORDER — ONDANSETRON HCL 4 MG PO TABS
ORAL_TABLET | ORAL | Status: AC
Start: 2017-03-05 — End: 2017-03-05
  Filled 2017-03-05: qty 2

## 2017-03-05 MED ORDER — ONDANSETRON HCL 4 MG PO TABS
8.0000 mg | ORAL_TABLET | ORAL | Status: DC
  Administered 2017-03-05: 8 mg via ORAL

## 2017-03-05 MED ORDER — ACETAMINOPHEN 325 MG PO TABS
650.0000 mg | ORAL_TABLET | ORAL | Status: DC
  Administered 2017-03-05: 650 mg via ORAL

## 2017-03-05 MED ORDER — ACETAMINOPHEN 325 MG PO TABS
ORAL_TABLET | ORAL | Status: AC
  Filled 2017-03-05: qty 2

## 2017-03-05 MED ORDER — DIPHENHYDRAMINE HCL 25 MG PO CAPS
50.0000 mg | ORAL_CAPSULE | ORAL | Status: DC
  Administered 2017-03-05: 10:00:00 50 mg via ORAL

## 2017-03-06 ENCOUNTER — Other Ambulatory Visit: Payer: Self-pay

## 2017-03-06 NOTE — Telephone Encounter (Signed)
Yes you can refill the others and give her a 3 month supply. She had previously requested to be seen at [facility] to be closer to her home. Not sure if she has done that yet. If she follows up with them they can take care of her medications. I did advise her to make sure she schedules her Remicade with them so she does not develop resistance to the drug. If she has not scheduled with them and wants to have Remicade infusion with [place] please let me know. Thanks

## 2017-03-06 NOTE — Telephone Encounter (Signed)
Pt is requesting refill of   Pantoprazole 40mg  BID  Diltiazem 60mg  TID  Simvastatin 10mg  qd  I will go ahead and refill the Pantoprazole. She was seen 01/2017.  Are you ok to refill the others?     [facility]

## 2017-03-07 ENCOUNTER — Other Ambulatory Visit: Payer: Self-pay

## 2017-03-07 MED ORDER — DILTIAZEM HCL 60 MG PO TABS: 60.0000 mg | ORAL_TABLET | Freq: Three times a day (TID) | ORAL | 3 refills | Status: DC

## 2017-03-07 MED ORDER — SIMVASTATIN 10 MG PO TABS: 10.0000 mg | ORAL_TABLET | Freq: Every day | ORAL | 1 refills | Status: DC

## 2017-03-07 NOTE — Telephone Encounter (Signed)
Medications have been refilled. Need to call the patient

## 2017-03-10 NOTE — Telephone Encounter (Signed)
Pt does wish to have Remicade infusions at [facility]. Her last infusion was last week at [facility]. She is scheduled in August for another infusion there.  Raynelle Fanning, Can you please help in scheduling the pt at [facility] for her next infusion.  Thank you.

## 2017-03-10 NOTE — Telephone Encounter (Signed)
Patient states that she does not want to switch GI providers, she only wanted to change where her infusion is being done. I have asked the other nurses, no one has ever scheduled at [facility] for an infusion. Will have to investigate this if she is to have them done there.

## 2017-03-10 NOTE — Telephone Encounter (Signed)
Thanks for clarifying. I was under the impression she wanted to transition all of her care to [facility]. I am happy to still care for her. She should continue her Remicade infusions, I'm not sure if they can be done at [facility] or not, I would assume they could given infusions there somewhere. Otherwise I think we should obtain CBC and CMET (it appears she had elevated liver enzymes on last blood draw a month ago) and I had hoped to repeat a colonoscopy at some point in the summer if you can help schedule her. I think her case may need to be done at the hospital for their anesthesia support. Thanks

## 2017-03-11 ENCOUNTER — Other Ambulatory Visit: Payer: Self-pay

## 2017-03-11 DIAGNOSIS — R7989 Other specified abnormal findings of blood chemistry: Secondary | ICD-10-CM

## 2017-03-11 DIAGNOSIS — R945 Abnormal results of liver function studies: Principal | ICD-10-CM

## 2017-03-11 MED ORDER — SODIUM CHLORIDE 0.9 % IV SOLN: 5.0000 mg/kg | INTRAVENOUS | 6 refills | Status: AC

## 2017-03-11 NOTE — Progress Notes (Signed)
done

## 2017-03-11 NOTE — Telephone Encounter (Signed)
Spoke to patient, she is aware that she is scheduled at [facility] on 8/8 11:30 for her next infusion of Remicade. Patient understands to go lab and have blood work done now. She is also scheduled for colonoscopy at next available hospital day, 8/31. Will send the patient a letter with dates/times of appointments.

## 2017-03-13 ENCOUNTER — Other Ambulatory Visit: Payer: Self-pay

## 2017-03-13 ENCOUNTER — Other Ambulatory Visit (HOSPITAL_COMMUNITY)
Admission: RE | Admit: 2017-03-13 | Discharge: 2017-03-13 | Disposition: A | Discharge status: Home / Self Care | Payer: Medicaid Other | Source: Ambulatory Visit | Attending: Gastroenterology | Admitting: Gastroenterology

## 2017-03-13 DIAGNOSIS — R7989 Other specified abnormal findings of blood chemistry: Secondary | ICD-10-CM | POA: Diagnosis present

## 2017-03-13 LAB — CBC WITH DIFFERENTIAL/PLATELET
BASOS ABS: 0.1 10*3/uL (ref 0.0–0.1)
BASOS PCT: 1 %
EOS ABS: 0.3 10*3/uL (ref 0.0–0.7)
EOS PCT: 4 %
HEMATOCRIT: 41.8 % (ref 36.0–46.0)
Hemoglobin: 13.7 g/dL (ref 12.0–15.0)
Lymphocytes Relative: 50 %
Lymphs Abs: 3.9 10*3/uL (ref 0.7–4.0)
MCH: 29.3 pg (ref 26.0–34.0)
MCHC: 32.8 g/dL (ref 30.0–36.0)
MCV: 89.3 fL (ref 78.0–100.0)
MONO ABS: 0.4 10*3/uL (ref 0.1–1.0)
MONOS PCT: 5 %
NEUTROS ABS: 3.1 10*3/uL (ref 1.7–7.7)
Neutrophils Relative %: 40 %
PLATELETS: 200 10*3/uL (ref 150–400)
RBC: 4.68 MIL/uL (ref 3.87–5.11)
RDW: 16.3 % — AB (ref 11.5–15.5)
WBC: 7.8 10*3/uL (ref 4.0–10.5)

## 2017-03-13 LAB — COMPREHENSIVE METABOLIC PANEL
ALK PHOS: 108 U/L (ref 38–126)
ALT: 43 U/L (ref 14–54)
ANION GAP: 11 (ref 5–15)
AST: 79 U/L — ABNORMAL HIGH (ref 15–41)
Albumin: 3.8 g/dL (ref 3.5–5.0)
BILIRUBIN TOTAL: 0.5 mg/dL (ref 0.3–1.2)
BUN: 9 mg/dL (ref 6–20)
CALCIUM: 9.9 mg/dL (ref 8.9–10.3)
CO2: 22 mmol/L (ref 22–32)
CREATININE: 0.8 mg/dL (ref 0.44–1.00)
Chloride: 108 mmol/L (ref 101–111)
GFR calc Af Amer: >60 mL/min (ref >60)
GFR calc non Af Amer: >60 mL/min (ref >60)
GLUCOSE: 322 mg/dL — AB (ref 65–99)
Potassium: 3.7 mmol/L (ref 3.5–5.1)
Sodium: 141 mmol/L (ref 135–145)
TOTAL PROTEIN: 7.4 g/dL (ref 6.5–8.1)

## 2017-03-14 ENCOUNTER — Other Ambulatory Visit: Payer: Self-pay

## 2017-03-14 DIAGNOSIS — R7989 Other specified abnormal findings of blood chemistry: Secondary | ICD-10-CM

## 2017-03-14 DIAGNOSIS — K5090 Crohn's disease, unspecified, without complications: Secondary | ICD-10-CM

## 2017-03-14 DIAGNOSIS — R945 Abnormal results of liver function studies: Principal | ICD-10-CM

## 2017-03-14 NOTE — Progress Notes (Signed)
done

## 2017-03-19 ENCOUNTER — Other Ambulatory Visit (HOSPITAL_COMMUNITY)
Admission: RE | Admit: 2017-03-19 | Discharge: 2017-03-19 | Disposition: A | Discharge status: Home / Self Care | Payer: Medicaid Other | Source: Ambulatory Visit | Attending: Gastroenterology | Admitting: Gastroenterology

## 2017-03-19 DIAGNOSIS — R7989 Other specified abnormal findings of blood chemistry: Secondary | ICD-10-CM | POA: Diagnosis not present

## 2017-03-19 DIAGNOSIS — R945 Abnormal results of liver function studies: Secondary | ICD-10-CM

## 2017-03-19 LAB — HEPATIC FUNCTION PANEL
ALT: 28 U/L (ref 14–54)
AST: 45 U/L — ABNORMAL HIGH (ref 15–41)
Albumin: 4.4 g/dL (ref 3.5–5.0)
Alkaline Phosphatase: 101 U/L (ref 38–126)
BILIRUBIN TOTAL: 0.5 mg/dL (ref 0.3–1.2)
Total Protein: 8.6 g/dL — ABNORMAL HIGH (ref 6.5–8.1)

## 2017-03-19 LAB — CK: Total CK: 113 U/L (ref 38–234)

## 2017-03-20 ENCOUNTER — Other Ambulatory Visit (HOSPITAL_COMMUNITY)
Admission: RE | Admit: 2017-03-20 | Discharge: 2017-03-20 | Disposition: A | Discharge status: Home / Self Care | Payer: Medicaid Other | Source: Ambulatory Visit | Attending: Gastroenterology | Admitting: Gastroenterology

## 2017-03-20 ENCOUNTER — Other Ambulatory Visit: Payer: Self-pay

## 2017-03-20 ENCOUNTER — Telehealth: Payer: Self-pay | Admitting: Gastroenterology

## 2017-03-20 DIAGNOSIS — R7401 Elevation of levels of liver transaminase levels: Secondary | ICD-10-CM

## 2017-03-20 DIAGNOSIS — R740 Nonspecific elevation of levels of transaminase and lactic acid dehydrogenase [LDH]: Principal | ICD-10-CM

## 2017-03-20 LAB — IRON AND TIBC
Iron: 40 ug/dL (ref 28–170)
SATURATION RATIOS: 7 % — AB (ref 10.4–31.8)
TIBC: 566 ug/dL — ABNORMAL HIGH (ref 250–450)
UIBC: 526 ug/dL

## 2017-03-20 LAB — FERRITIN: FERRITIN: 22 ng/mL (ref 11–307)

## 2017-03-20 NOTE — Telephone Encounter (Signed)
[name], I received a letter from Dr. Jeanice Lim from oral surgery, he is asking if he can coordinate his surgery (full mouth extraction) around her Remicade. I would normally recommend doing this about 8-9 weeks after her last dose of Remicade, and then resuming it about 2 weeks after the surgery if she recovers okay (essentially she will have one dose delayed by 2 weeks). Not sure how urgent this is to do from his perspective. Her last dose of Remicade was 03/05/17. She would next be due for dosing around August 8th. She may want to have the surgery around the 15th of August or so, and delay her next dose by 2 weeks. Can you let their office or the patient know? Thanks

## 2017-03-21 ENCOUNTER — Telehealth: Payer: Self-pay

## 2017-03-21 ENCOUNTER — Telehealth: Payer: Self-pay | Admitting: Gastroenterology

## 2017-03-21 LAB — ANTINUCLEAR ANTIBODIES, IFA: ANTINUCLEAR ANTIBODIES, IFA: NEGATIVE

## 2017-03-21 LAB — CERULOPLASMIN: Ceruloplasmin: 28 mg/dL (ref 19.0–39.0)

## 2017-03-21 LAB — ALPHA-1-ANTITRYPSIN: A-1 Antitrypsin, Ser: 151 mg/dL (ref 90–200)

## 2017-03-21 LAB — IGG: IGG (IMMUNOGLOBIN G), SERUM: 742 mg/dL (ref 700–1600)

## 2017-03-21 LAB — ANTI-SMOOTH MUSCLE ANTIBODY, IGG: F-ACTIN AB IGG: 14 U (ref 0–19)

## 2017-03-21 NOTE — Telephone Encounter (Signed)
Called Dr. Deirdre Peer office, relayed information to them when best to schedule patient's surgery. Also faxed information to: [phone].

## 2017-03-21 NOTE — Telephone Encounter (Signed)
Okay. I will have to speak with his surgeon directly to assess what exactly they are doing and her risk for infection. I will be out of the office until Thursday next week, I will call him next week

## 2017-03-21 NOTE — Telephone Encounter (Signed)
Dr. Deirdre Peer assistant, Joni Reining, called back. I had given them your recommendations for when patient can safely have surgery and faxed them your response. Patient is in a lot of pain and wondering if something could be scheduled sooner. Their office will be placing patient on Clindamycin for 7 days afterwards, if there is another antibiotic that you would prefer her to be on, please advise. If patient absolutely cannot have surgery until 8/15, they will not schedule her until then. If you need to contact them, their number is: [phone].

## 2017-03-21 NOTE — Telephone Encounter (Signed)
Let patient know that Dr. Adela Lank has not had a chance to review results, but as soon as they are we will call her.

## 2017-03-24 ENCOUNTER — Telehealth: Payer: Self-pay | Admitting: Gastroenterology

## 2017-03-24 NOTE — Telephone Encounter (Signed)
Called Dr. Deirdre Peer office left message for Joni Reining that you would call this physician on Thursday to discuss scheduling further. The receptionist told me that patient is scheduled for next Tuesday to have the surgery. I told her not to cancel it until after you have contacted their office.

## 2017-03-24 NOTE — Telephone Encounter (Signed)
Called patient back let her know that I did not cancel the surgery, told them explicitly to not cancel the surgery until after the doctors have had a chance to talk. Let her know that we want the very best possible outcome, she has multiple health issues going on and the timing of this surgery needs to be right. I told her that if Joni Reining wanted to call me back, she is more than welcome, I had already spoke to their office once today.

## 2017-03-25 ENCOUNTER — Ambulatory Visit (HOSPITAL_COMMUNITY)
Admission: RE | Admit: 2017-03-25 | Discharge: 2017-03-25 | Disposition: A | Discharge status: Home / Self Care | Payer: Medicaid Other | Source: Ambulatory Visit | Attending: Gastroenterology | Admitting: Gastroenterology

## 2017-03-25 DIAGNOSIS — Z9049 Acquired absence of other specified parts of digestive tract: Secondary | ICD-10-CM | POA: Diagnosis not present

## 2017-03-25 DIAGNOSIS — R932 Abnormal findings on diagnostic imaging of liver and biliary tract: Secondary | ICD-10-CM | POA: Diagnosis not present

## 2017-03-25 DIAGNOSIS — R740 Nonspecific elevation of levels of transaminase and lactic acid dehydrogenase [LDH]: Secondary | ICD-10-CM | POA: Insufficient documentation

## 2017-03-25 DIAGNOSIS — R7401 Elevation of levels of liver transaminase levels: Secondary | ICD-10-CM

## 2017-03-27 ENCOUNTER — Telehealth: Payer: Self-pay | Admitting: Gastroenterology

## 2017-03-27 NOTE — Telephone Encounter (Signed)
Left message for patient that I do not have any results to give her yet. Dr. Adela Lank is trying to get through all his result notes as he just got back into the office. Let her know that as soon as we get some information for her we will let her know.

## 2017-03-27 NOTE — Telephone Encounter (Signed)
I called Dr. Deirdre Peer office this morning, he was already in the OR but should be able to call me back later today to discuss her case

## 2017-03-27 NOTE — Telephone Encounter (Signed)
I called back and spoke with Dr. Jeanice Lim - he is planning on removing multiple teeth with some procedure with her jaw as well. Infectious risk is not insignificant - ideally would recommend on holding off until her Remicade levels are likely lower, towards middle to end of August (next due for her infusion around August 15th). If she had her procedure done around then, would hold her next dose of Remicade about 2 weeks until she recovered.  That being said if she has abscess/infection in her tooth or she needs an urgent surgery, then they should proceed now with the understanding there is perhaps increased risk for infection being on Remicade post-operatively. He did not seem to think her procedure was urgent, more so for pain control. He may pull one tooth that is particularly bothering the patient now, and remove the rest at a later time. He can call me with any questions moving forward.   Of note, I did not cancel or alter her schedule with him in any way. He did not want to proceed with anything without speaking with me, he can proceed as he wishes at this time.

## 2017-03-27 NOTE — Telephone Encounter (Signed)
I called her oral surgeon, details in my other note as outlined. We did not cancel her surgery.

## 2017-03-28 ENCOUNTER — Other Ambulatory Visit: Payer: Self-pay

## 2017-03-28 DIAGNOSIS — R945 Abnormal results of liver function studies: Secondary | ICD-10-CM

## 2017-03-28 DIAGNOSIS — R7989 Other specified abnormal findings of blood chemistry: Secondary | ICD-10-CM

## 2017-03-28 DIAGNOSIS — K760 Fatty (change of) liver, not elsewhere classified: Secondary | ICD-10-CM

## 2017-03-28 NOTE — Progress Notes (Unsigned)
lft

## 2017-03-28 NOTE — Telephone Encounter (Signed)
Noted  

## 2017-03-29 ENCOUNTER — Other Ambulatory Visit: Payer: Self-pay | Admitting: "Endocrinology

## 2017-04-07 ENCOUNTER — Other Ambulatory Visit (HOSPITAL_COMMUNITY): Payer: Self-pay | Admitting: Neurology

## 2017-04-07 DIAGNOSIS — M545 Low back pain: Secondary | ICD-10-CM

## 2017-04-07 DIAGNOSIS — M25551 Pain in right hip: Secondary | ICD-10-CM

## 2017-04-07 DIAGNOSIS — M25552 Pain in left hip: Secondary | ICD-10-CM

## 2017-04-17 ENCOUNTER — Telehealth: Payer: Self-pay

## 2017-04-17 ENCOUNTER — Other Ambulatory Visit: Payer: Self-pay | Admitting: Cardiology

## 2017-04-17 ENCOUNTER — Other Ambulatory Visit: Payer: Self-pay

## 2017-04-17 MED ORDER — PANTOPRAZOLE SODIUM 40 MG PO TBEC: 40.0000 mg | DELAYED_RELEASE_TABLET | Freq: Two times a day (BID) | ORAL | 3 refills | Status: DC

## 2017-04-17 MED ORDER — DICYCLOMINE HCL 20 MG PO TABS: 20.0000 mg | ORAL_TABLET | Freq: Three times a day (TID) | ORAL | 3 refills | Status: AC | PRN

## 2017-04-17 NOTE — Telephone Encounter (Signed)
Pt is requesting refills of Pantoprazole and Dicyclomine. She was last seen 11/2016. Are you ok with refilling both medications?

## 2017-04-17 NOTE — Telephone Encounter (Signed)
Yes you can refill both. Thanks

## 2017-04-18 ENCOUNTER — Telehealth: Payer: Self-pay

## 2017-04-18 ENCOUNTER — Other Ambulatory Visit: Payer: Self-pay

## 2017-04-18 MED ORDER — COLESTIPOL HCL 1 G PO TABS: 1.0000 g | ORAL_TABLET | Freq: Two times a day (BID) | ORAL | 3 refills | Status: AC

## 2017-04-18 NOTE — Telephone Encounter (Signed)
Yes okay to refill. thanks 

## 2017-04-18 NOTE — Telephone Encounter (Signed)
Colestipol refilled to [facility].

## 2017-04-18 NOTE — Telephone Encounter (Signed)
Refill request for Colestipol from [place] faxed today. Are you ok to refill?

## 2017-04-22 ENCOUNTER — Other Ambulatory Visit (HOSPITAL_COMMUNITY): Payer: Self-pay | Admitting: Neurology

## 2017-04-22 ENCOUNTER — Telehealth: Payer: Self-pay

## 2017-04-22 ENCOUNTER — Ambulatory Visit (HOSPITAL_COMMUNITY)
Admission: RE | Admit: 2017-04-22 | Discharge: 2017-04-22 | Disposition: A | Discharge status: Home / Self Care | Payer: Medicaid Other | Source: Ambulatory Visit | Attending: Neurology | Admitting: Neurology

## 2017-04-22 ENCOUNTER — Other Ambulatory Visit: Payer: Self-pay

## 2017-04-22 DIAGNOSIS — M5136 Other intervertebral disc degeneration, lumbar region: Secondary | ICD-10-CM | POA: Diagnosis not present

## 2017-04-22 DIAGNOSIS — M25551 Pain in right hip: Secondary | ICD-10-CM

## 2017-04-22 DIAGNOSIS — M545 Low back pain: Secondary | ICD-10-CM | POA: Diagnosis present

## 2017-04-22 DIAGNOSIS — M25552 Pain in left hip: Secondary | ICD-10-CM

## 2017-04-22 NOTE — Telephone Encounter (Signed)
-----   Message from Evalee Jefferson, LPN sent at 1/61/0960 10:04 AM EDT ----- Repeat LFT-has these done at [facility]. The order is not in [facility]

## 2017-04-22 NOTE — Telephone Encounter (Signed)
Mailed patient a reminder letter to get labs done in mid August. I see where [name] has placed the order in Epic. Instructed patient that she can get these done at [facility].

## 2017-04-24 ENCOUNTER — Telehealth: Payer: Self-pay

## 2017-04-24 NOTE — Progress Notes (Signed)
Jonelle Sidle, MD  Leverne Humbles, RN        Plavix may be held 7 days prior to procedure.

## 2017-04-24 NOTE — Telephone Encounter (Signed)
Jonelle Sidle, MD  Meagan Humbles, RN        Plavix may be held 7 days prior to procedure.

## 2017-04-25 ENCOUNTER — Other Ambulatory Visit (HOSPITAL_COMMUNITY): Payer: Self-pay | Admitting: Neurology

## 2017-04-25 DIAGNOSIS — M545 Low back pain: Secondary | ICD-10-CM

## 2017-04-30 ENCOUNTER — Telehealth: Payer: Self-pay | Admitting: Gastroenterology

## 2017-04-30 ENCOUNTER — Other Ambulatory Visit (HOSPITAL_COMMUNITY)
Admission: RE | Admit: 2017-04-30 | Discharge: 2017-04-30 | Disposition: A | Discharge status: Home / Self Care | Payer: Medicaid Other | Source: Ambulatory Visit | Attending: Gastroenterology | Admitting: Gastroenterology

## 2017-04-30 ENCOUNTER — Encounter (HOSPITAL_COMMUNITY): Payer: Self-pay

## 2017-04-30 ENCOUNTER — Encounter (HOSPITAL_COMMUNITY)
Admission: RE | Admit: 2017-04-30 | Discharge: 2017-04-30 | Disposition: A | Discharge status: Home / Self Care | Payer: Medicaid Other | Source: Ambulatory Visit | Attending: Gastroenterology | Admitting: Gastroenterology

## 2017-04-30 DIAGNOSIS — K50119 Crohn's disease of large intestine with unspecified complications: Secondary | ICD-10-CM | POA: Diagnosis present

## 2017-04-30 DIAGNOSIS — R7989 Other specified abnormal findings of blood chemistry: Secondary | ICD-10-CM | POA: Insufficient documentation

## 2017-04-30 DIAGNOSIS — K760 Fatty (change of) liver, not elsewhere classified: Secondary | ICD-10-CM | POA: Diagnosis present

## 2017-04-30 LAB — HEPATIC FUNCTION PANEL
ALBUMIN: 4 g/dL (ref 3.5–5.0)
ALK PHOS: 109 U/L (ref 38–126)
ALT: 39 U/L (ref 14–54)
AST: 72 U/L — AB (ref 15–41)
Bilirubin, Direct: <0.1 mg/dL — ABNORMAL LOW (ref 0.1–0.5)
Total Bilirubin: 0.8 mg/dL (ref 0.3–1.2)
Total Protein: 7.9 g/dL (ref 6.5–8.1)

## 2017-04-30 MED ORDER — DIPHENHYDRAMINE HCL 25 MG PO CAPS
ORAL_CAPSULE | ORAL | Status: AC
  Filled 2017-04-30: qty 2

## 2017-04-30 MED ORDER — SODIUM CHLORIDE 0.9 % IV SOLN
5.0000 mg/kg | INTRAVENOUS | Status: DC
  Administered 2017-04-30: 500 mg via INTRAVENOUS
  Filled 2017-04-30: qty 50

## 2017-04-30 MED ORDER — ONDANSETRON 4 MG PO TBDP
ORAL_TABLET | ORAL | Status: AC
  Filled 2017-04-30: qty 1

## 2017-04-30 MED ORDER — ONDANSETRON HCL 4 MG PO TABS
4.0000 mg | ORAL_TABLET | Freq: Once | ORAL | Status: DC
  Filled 2017-04-30: qty 1

## 2017-04-30 MED ORDER — SODIUM CHLORIDE 0.9 % IV SOLN
INTRAVENOUS | Status: DC
  Administered 2017-04-30: 10:00:00 via INTRAVENOUS

## 2017-04-30 MED ORDER — ONDANSETRON 4 MG PO TBDP
4.0000 mg | ORAL_TABLET | Freq: Once | ORAL | Status: AC
  Administered 2017-04-30: 4 mg via ORAL

## 2017-04-30 MED ORDER — ACETAMINOPHEN 325 MG PO TABS
650.0000 mg | ORAL_TABLET | Freq: Once | ORAL | Status: AC
  Administered 2017-04-30: 650 mg via ORAL

## 2017-04-30 MED ORDER — DIPHENHYDRAMINE HCL 25 MG PO CAPS
50.0000 mg | ORAL_CAPSULE | Freq: Once | ORAL | Status: AC
  Administered 2017-04-30: 50 mg via ORAL

## 2017-04-30 MED ORDER — ACETAMINOPHEN 325 MG PO TABS
ORAL_TABLET | ORAL | Status: AC
  Filled 2017-04-30: qty 2

## 2017-04-30 NOTE — Telephone Encounter (Signed)
Patient just wanted to know where to go for her pre-visit appointment. Instructed to come check in at the 3rd floor and will be redirected upstairs to meet with endoscopy nurse.

## 2017-05-01 ENCOUNTER — Ambulatory Visit (HOSPITAL_COMMUNITY): Admission: RE | Admit: 2017-05-01 | Payer: Medicaid Other | Source: Ambulatory Visit

## 2017-05-01 ENCOUNTER — Ambulatory Visit: Payer: Self-pay | Admitting: Family Medicine

## 2017-05-05 ENCOUNTER — Telehealth: Payer: Self-pay | Admitting: Gastroenterology

## 2017-05-05 ENCOUNTER — Ambulatory Visit (AMBULATORY_SURGERY_CENTER): Payer: Self-pay

## 2017-05-05 VITALS — Ht 64.0 in | Wt 215.4 lb

## 2017-05-05 DIAGNOSIS — K5090 Crohn's disease, unspecified, without complications: Secondary | ICD-10-CM

## 2017-05-05 MED ORDER — SUPREP BOWEL PREP KIT 17.5-3.13-1.6 GM/177ML PO SOLN: 1.0000 | Freq: Once | ORAL | 0 refills | Status: AC

## 2017-05-05 NOTE — Telephone Encounter (Signed)
I see you have not had a chance to review these yet. I guess patient was upstairs for her pre-visit appointment and came down here while I was at lunch. Thanks.

## 2017-05-05 NOTE — Progress Notes (Signed)
No allergies to eggs or soy No diet meds No past problems with anesthesia  +Oxygen when asleep  Declined emmi

## 2017-05-06 ENCOUNTER — Other Ambulatory Visit: Payer: Self-pay

## 2017-05-06 DIAGNOSIS — R945 Abnormal results of liver function studies: Principal | ICD-10-CM

## 2017-05-06 DIAGNOSIS — R7989 Other specified abnormal findings of blood chemistry: Secondary | ICD-10-CM

## 2017-05-06 NOTE — Progress Notes (Signed)
done

## 2017-05-06 NOTE — Telephone Encounter (Signed)
Just saw her labs - recommendations per result note which I forwarded to you. Thanks

## 2017-05-08 ENCOUNTER — Ambulatory Visit (HOSPITAL_COMMUNITY): Payer: Medicaid Other

## 2017-05-13 ENCOUNTER — Other Ambulatory Visit: Payer: Self-pay | Admitting: Cardiology

## 2017-05-13 ENCOUNTER — Encounter (HOSPITAL_COMMUNITY): Payer: Self-pay | Admitting: *Deleted

## 2017-05-14 ENCOUNTER — Other Ambulatory Visit (HOSPITAL_COMMUNITY): Payer: Self-pay | Admitting: Neurology

## 2017-05-14 ENCOUNTER — Other Ambulatory Visit: Payer: Self-pay | Admitting: Family Medicine

## 2017-05-14 DIAGNOSIS — Z1231 Encounter for screening mammogram for malignant neoplasm of breast: Secondary | ICD-10-CM

## 2017-05-14 DIAGNOSIS — M545 Low back pain: Secondary | ICD-10-CM

## 2017-05-20 ENCOUNTER — Ambulatory Visit (HOSPITAL_COMMUNITY): Payer: Medicaid Other

## 2017-05-20 ENCOUNTER — Telehealth: Payer: Self-pay | Admitting: *Deleted

## 2017-05-20 ENCOUNTER — Other Ambulatory Visit: Payer: Self-pay | Admitting: Cardiology

## 2017-05-20 NOTE — Telephone Encounter (Signed)
I can refill if needed if she is still using it, that's fine. Thanks

## 2017-05-20 NOTE — Telephone Encounter (Signed)
[name] if this is working for her and she thinks it is helping her symptoms, she can transition to 120mg  extended release once daily. Thanks

## 2017-05-20 NOTE — Telephone Encounter (Signed)
[facility] Rx request came through for Diltazem 60 mg tid for patient. I see where you rx'ed this to treat esophageal spasm. However, it appears that most recently, Dr Diona Browner, her cardiologist has refilled. Do you want to refill or is Dr Diona Browner refilling now?

## 2017-05-21 NOTE — Telephone Encounter (Signed)
Okay thanks for the update. If diltiazem is not helping then she can stop it. I'll discuss further with her on Friday. thanks

## 2017-05-21 NOTE — Telephone Encounter (Signed)
Patient has been advised that per Dr Adela Lank, if diltiazem is not helping, then she may discontinue it. However, he will discuss this with her on Friday in more detail. Patient verbalizes understanding of this.

## 2017-05-21 NOTE — Telephone Encounter (Signed)
I have spoken to patient and she states that the diltiazem 60 mg tid is actually NOT helping very much. She is still having lots of midsternal chest pain/spasm and is not sure if related to her cardiac issues or her esopahgeal spasms. She is actually scheduled for colonoscopy with Dr Adela Lank on Friday (I did not previously realize this). I advised that I would make Dr Adela Lank aware of this information and get his input as to what he would like to do from here.Marland KitchenMarland Kitchen

## 2017-05-23 ENCOUNTER — Ambulatory Visit (HOSPITAL_COMMUNITY): Payer: Medicaid Other | Admitting: Certified Registered Nurse Anesthetist

## 2017-05-23 ENCOUNTER — Other Ambulatory Visit: Payer: Self-pay

## 2017-05-23 ENCOUNTER — Encounter (HOSPITAL_COMMUNITY)
Admission: RE | Disposition: A | Discharge status: Home / Self Care | Payer: Self-pay | Source: Ambulatory Visit | Attending: Gastroenterology

## 2017-05-23 ENCOUNTER — Telehealth: Payer: Self-pay

## 2017-05-23 ENCOUNTER — Encounter (HOSPITAL_COMMUNITY): Payer: Self-pay | Admitting: *Deleted

## 2017-05-23 ENCOUNTER — Ambulatory Visit (HOSPITAL_COMMUNITY)
Admission: RE | Admit: 2017-05-23 | Discharge: 2017-05-23 | Disposition: A | Discharge status: Home / Self Care | Payer: Medicaid Other | Source: Ambulatory Visit | Attending: Gastroenterology | Admitting: Gastroenterology

## 2017-05-23 DIAGNOSIS — Z7902 Long term (current) use of antithrombotics/antiplatelets: Secondary | ICD-10-CM | POA: Insufficient documentation

## 2017-05-23 DIAGNOSIS — Z950 Presence of cardiac pacemaker: Secondary | ICD-10-CM | POA: Diagnosis not present

## 2017-05-23 DIAGNOSIS — Z8249 Family history of ischemic heart disease and other diseases of the circulatory system: Secondary | ICD-10-CM | POA: Insufficient documentation

## 2017-05-23 DIAGNOSIS — F17210 Nicotine dependence, cigarettes, uncomplicated: Secondary | ICD-10-CM | POA: Diagnosis not present

## 2017-05-23 DIAGNOSIS — F4310 Post-traumatic stress disorder, unspecified: Secondary | ICD-10-CM | POA: Insufficient documentation

## 2017-05-23 DIAGNOSIS — K3184 Gastroparesis: Secondary | ICD-10-CM | POA: Diagnosis not present

## 2017-05-23 DIAGNOSIS — M1990 Unspecified osteoarthritis, unspecified site: Secondary | ICD-10-CM | POA: Diagnosis not present

## 2017-05-23 DIAGNOSIS — K5090 Crohn's disease, unspecified, without complications: Secondary | ICD-10-CM

## 2017-05-23 DIAGNOSIS — I69351 Hemiplegia and hemiparesis following cerebral infarction affecting right dominant side: Secondary | ICD-10-CM | POA: Insufficient documentation

## 2017-05-23 DIAGNOSIS — F32A Depression, unspecified: Secondary | ICD-10-CM

## 2017-05-23 DIAGNOSIS — E1143 Type 2 diabetes mellitus with diabetic autonomic (poly)neuropathy: Secondary | ICD-10-CM | POA: Diagnosis not present

## 2017-05-23 DIAGNOSIS — K648 Other hemorrhoids: Secondary | ICD-10-CM | POA: Insufficient documentation

## 2017-05-23 DIAGNOSIS — J449 Chronic obstructive pulmonary disease, unspecified: Secondary | ICD-10-CM | POA: Diagnosis not present

## 2017-05-23 DIAGNOSIS — F329 Major depressive disorder, single episode, unspecified: Secondary | ICD-10-CM

## 2017-05-23 DIAGNOSIS — Z880 Allergy status to penicillin: Secondary | ICD-10-CM | POA: Insufficient documentation

## 2017-05-23 DIAGNOSIS — R001 Bradycardia, unspecified: Secondary | ICD-10-CM | POA: Diagnosis not present

## 2017-05-23 DIAGNOSIS — R1310 Dysphagia, unspecified: Secondary | ICD-10-CM

## 2017-05-23 DIAGNOSIS — Z888 Allergy status to other drugs, medicaments and biological substances status: Secondary | ICD-10-CM | POA: Insufficient documentation

## 2017-05-23 DIAGNOSIS — Z87442 Personal history of urinary calculi: Secondary | ICD-10-CM | POA: Diagnosis not present

## 2017-05-23 DIAGNOSIS — I10 Essential (primary) hypertension: Secondary | ICD-10-CM | POA: Diagnosis not present

## 2017-05-23 DIAGNOSIS — K5010 Crohn's disease of large intestine without complications: Secondary | ICD-10-CM | POA: Insufficient documentation

## 2017-05-23 DIAGNOSIS — G4733 Obstructive sleep apnea (adult) (pediatric): Secondary | ICD-10-CM | POA: Insufficient documentation

## 2017-05-23 DIAGNOSIS — K219 Gastro-esophageal reflux disease without esophagitis: Secondary | ICD-10-CM | POA: Diagnosis not present

## 2017-05-23 HISTORY — PX: ESOPHAGOGASTRODUODENOSCOPY (EGD) WITH PROPOFOL: SHX5813

## 2017-05-23 HISTORY — PX: SAVORY DILATION: SHX5439

## 2017-05-23 HISTORY — PX: COLONOSCOPY: SHX5424

## 2017-05-23 LAB — GLUCOSE, CAPILLARY: GLUCOSE-CAPILLARY: 189 mg/dL — AB (ref 65–99)

## 2017-05-23 SURGERY — COLONOSCOPY
Anesthesia: Monitor Anesthesia Care

## 2017-05-23 MED ORDER — LIDOCAINE 2% (20 MG/ML) 5 ML SYRINGE
INTRAMUSCULAR | Status: AC
  Filled 2017-05-23: qty 10

## 2017-05-23 MED ORDER — PROPOFOL 500 MG/50ML IV EMUL
INTRAVENOUS | Status: DC | PRN
  Administered 2017-05-23: 150 ug/kg/min via INTRAVENOUS

## 2017-05-23 MED ORDER — PHENYLEPHRINE 40 MCG/ML (10ML) SYRINGE FOR IV PUSH (FOR BLOOD PRESSURE SUPPORT)
PREFILLED_SYRINGE | INTRAVENOUS | Status: DC | PRN
  Administered 2017-05-23 (×5): 80 ug via INTRAVENOUS

## 2017-05-23 MED ORDER — PROPOFOL 10 MG/ML IV BOLUS
INTRAVENOUS | Status: AC
  Filled 2017-05-23: qty 40

## 2017-05-23 MED ORDER — SODIUM CHLORIDE 0.9 % IV SOLN: INTRAVENOUS | Status: DC

## 2017-05-23 MED ORDER — LIDOCAINE 2% (20 MG/ML) 5 ML SYRINGE
INTRAMUSCULAR | Status: DC | PRN
  Administered 2017-05-23: 100 mg via INTRAVENOUS

## 2017-05-23 MED ORDER — LACTATED RINGERS IV SOLN
INTRAVENOUS | Status: DC
  Administered 2017-05-23 (×2): via INTRAVENOUS

## 2017-05-23 MED ORDER — PROPOFOL 10 MG/ML IV BOLUS
INTRAVENOUS | Status: DC | PRN
  Administered 2017-05-23 (×3): 20 mg via INTRAVENOUS
  Administered 2017-05-23: 30 mg via INTRAVENOUS
  Administered 2017-05-23: 50 mg via INTRAVENOUS
  Administered 2017-05-23 (×2): 20 mg via INTRAVENOUS

## 2017-05-23 MED ORDER — PHENYLEPHRINE 40 MCG/ML (10ML) SYRINGE FOR IV PUSH (FOR BLOOD PRESSURE SUPPORT)
PREFILLED_SYRINGE | INTRAVENOUS | Status: AC
  Filled 2017-05-23: qty 10

## 2017-05-23 MED ORDER — EPHEDRINE SULFATE-NACL 50-0.9 MG/10ML-% IV SOSY
PREFILLED_SYRINGE | INTRAVENOUS | Status: DC | PRN
  Administered 2017-05-23 (×3): 10 mg via INTRAVENOUS

## 2017-05-23 MED ORDER — PROPOFOL 10 MG/ML IV BOLUS
INTRAVENOUS | Status: AC
  Filled 2017-05-23: qty 20

## 2017-05-23 NOTE — Transfer of Care (Signed)
Immediate Anesthesia Transfer of Care Note  Patient: Myriam Forehand  Procedure(s) Performed: Procedure(s): COLONOSCOPY (N/A) ESOPHAGOGASTRODUODENOSCOPY (EGD) WITH PROPOFOL (N/A) SAVORY DILATION (N/A)  Patient Location: PACU  Anesthesia Type:MAC  Level of Consciousness: Patient easily awoken, sedated, comfortable, cooperative, following commands, responds to stimulation.   Airway & Oxygen Therapy: Patient spontaneously breathing, ventilating well, oxygen via simple oxygen mask.  Post-op Assessment: Report given to PACU RN, vital signs reviewed and stable, moving all extremities.   Post vital signs: Reviewed and stable.  Complications: No apparent anesthesia complications Last Vitals:  Vitals:   05/23/17 0856 05/23/17 1040  BP: (!) 163/92 (!) 123/44  Pulse:  98  Resp: 11 (!) 31  Temp: 37.2 C   SpO2: 95% 99%    Last Pain:  Vitals:   05/23/17 0856  TempSrc: Oral         Complications: No apparent anesthesia complications

## 2017-05-23 NOTE — Op Note (Addendum)
[facility] Patient Name: [name] Procedure Date: 05/23/2017 MRN: [id number] Attending MD: Carlota Raspberry. Armbruster MD, MD Date of Birth: [birth year] CSN: [id number] Age: 51 Admit Type: Outpatient Procedure:                Upper GI endoscopy Indications:              Dysphagia - history of improvement with empiric                            dilation on last EGD. Biopsies negative for EoE                            previously. Manometry without clear pathology other                            than mildly elevated DCI - no improvement with                            diltiazem, symptoms have recurred. Providers:                [place] Cellar MD, MD, Laverta Baltimore RN, RN,                            Tinnie Gens, Technician Referring MD:              Medicines:                Monitored Anesthesia Care Complications:            No immediate complications. Estimated blood loss:                            Minimal. Estimated Blood Loss:     Estimated blood loss was minimal. Procedure:                Pre-Anesthesia Assessment:                           - Prior to the procedure, a History and Physical                            was performed, and patient medications and                            allergies were reviewed. The patient's tolerance of                            previous anesthesia was also reviewed. The risks                            and benefits of the procedure and the sedation                            options and risks were discussed with the patient.  All questions were answered, and informed consent                            was obtained. Prior Anticoagulants: The patient has                            taken Plavix (clopidogrel), last dose was 5 days                            prior to procedure. ASA Grade Assessment: III - A                            patient with severe systemic disease. After                             reviewing the risks and benefits, the patient was                            deemed in satisfactory condition to undergo the                            procedure.                           After obtaining informed consent, the endoscope was                            passed under direct vision. Throughout the                            procedure, the patient's blood pressure, pulse, and                            oxygen saturations were monitored continuously. The                            [id number]) scope was introduced through the                            mouth, and advanced to the second part of duodenum.                            The upper GI endoscopy was accomplished without                            difficulty. The patient tolerated the procedure                            well. Scope In: Scope Out: Findings:      Esophagogastric landmarks were identified: the Z-line was found at 38       cm, the gastroesophageal junction was found at 38 cm and the upper       extent of the gastric folds was found at 38 cm from the incisors.  The exam of the esophagus was otherwise normal. No clear stenosis /       stricture appreciated.      A guidewire was placed and the scope was withdrawn. Empiric dilation was       performed in the entire esophagus with a Savary dilator with no       resistance at 17 mm and 18 mm. Relook endoscopy showed no mucosal wrents       after this intervention.      The entire examined stomach was normal.      The duodenal bulb and second portion of the duodenum were normal. Impression:               - Esophagogastric landmarks identified.                           - Normal esophagus - empirically dilated to 62m                           - Normal stomach.                           - Normal duodenal bulb and second portion of the                            duodenum. Moderate Sedation:      No moderate sedation, case performed with  MAC Recommendation:           - Patient has a contact number available for                            emergencies. The signs and symptoms of potential                            delayed complications were discussed with the                            patient. Return to normal activities tomorrow.                            Written discharge instructions were provided to the                            patient.                           - Resume previous diet.                           - Continue present medications.                           - Can stop diltiazem                           - Await course following dilation                           - Resume Plavix  tonight Procedure Code(s):        --- Professional ---                           [phone], Esophagogastroduodenoscopy, flexible,                            transoral; with insertion of guide wire followed by                            passage of dilator(s) through esophagus over guide                            wire Diagnosis Code(s):        --- Professional ---                           R13.10, Dysphagia, unspecified CPT copyright 2016 American Medical Association. All rights reserved. The codes documented in this report are preliminary and upon coder review may  be revised to meet current compliance requirements. Remo Lipps P. Armbruster MD, MD 05/23/2017 10:41:01 AM This report has been signed electronically. Number of Addenda: 0

## 2017-05-23 NOTE — Interval H&P Note (Signed)
History and Physical Interval Note:  05/23/2017 9:34 AM  [name]  has presented today for surgery, with the diagnosis of crohns  The various methods of treatment have been discussed with the patient and family. After consideration of risks, benefits and other options for treatment, the patient has consented to  Procedure(s): COLONOSCOPY (N/A) and EGD with dilation as a surgical intervention .  The patient's history has been reviewed, patient examined, no change in status, stable for surgery.  I have reviewed the patient's chart and labs.  Questions were answered to the patient's satisfaction.     [name]

## 2017-05-23 NOTE — Telephone Encounter (Signed)
Referral placed for Behavioral health in [place]. I am not sure they take her insurance, if not then would refer to [facility].

## 2017-05-23 NOTE — Telephone Encounter (Signed)
-----   Message from Benancio Deeds, MD sent at 05/23/2017 11:26 AM EDT ----- Regarding: psychology referral Hi Raynelle Fanning, I did EGD / colonoscopy for Mrs. Sabic today. She is having some depression issues and difficulty with relationship with her son. She was not happy with the previous mental health provider she saw. She is looking to see someone for counseling. Can you refer her to a psychologist for further evaluation? Thanks

## 2017-05-23 NOTE — Discharge Instructions (Signed)
Colonoscopy, Adult, Care After °This sheet gives you information about how to care for yourself after your procedure. Your doctor may also give you more specific instructions. If you have problems or questions, call your doctor. °Follow these instructions at home: °General instructions ° °· For the first 24 hours after the procedure: °? Do not drive or use machinery. °? Do not sign important documents. °? Do not drink alcohol. °? Do your daily activities more slowly than normal. °? Eat foods that are soft and easy to digest. °? Rest often. °· Take over-the-counter or prescription medicines only as told by your doctor. °· It is up to you to get the results of your procedure. Ask your doctor, or the department performing the procedure, when your results will be ready. °To help cramping and bloating: °· Try walking around. °· Put heat on your belly (abdomen) as told by your doctor. Use a heat source that your doctor recommends, such as a moist heat pack or a heating pad. °? Put a towel between your skin and the heat source. °? Leave the heat on for 20-30 minutes. °? Remove the heat if your skin turns bright red. This is especially important if you cannot feel pain, heat, or cold. You can get burned. °Eating and drinking °· Drink enough fluid to keep your pee (urine) clear or pale yellow. °· Return to your normal diet as told by your doctor. Avoid heavy or fried foods that are hard to digest. °· Avoid drinking alcohol for as long as told by your doctor. °Contact a doctor if: °· You have blood in your poop (stool) 2-3 days after the procedure. °Get help right away if: °· You have more than a small amount of blood in your poop. °· You see large clumps of tissue (blood clots) in your poop. °· Your belly is swollen. °· You feel sick to your stomach (nauseous). °· You throw up (vomit). °· You have a fever. °· You have belly pain that gets worse, and medicine does not help your pain. °This information is not intended to  replace advice given to you by your health care provider. Make sure you discuss any questions you have with your health care provider. °Document Released: 10/12/2010 Document Revised: 06/03/2016 Document Reviewed: 06/03/2016 °Elsevier Interactive Patient Education © 2017 [employer] °Esophagogastroduodenoscopy, Care After °Refer to this sheet in the next few weeks. These instructions provide you with information about caring for yourself after your procedure. Your health care provider may also give you more specific instructions. Your treatment has been planned according to current medical practices, but problems sometimes occur. Call your health care provider if you have any problems or questions after your procedure. °What can I expect after the procedure? °After the procedure, it is common to have: °· A sore throat. °· Nausea. °· Bloating. °· Dizziness. °· Fatigue. ° °Follow these instructions at home: °· Do not eat or drink anything until the numbing medicine (local anesthetic) has worn off and your gag reflex has returned. You will know that the local anesthetic has worn off when you can swallow comfortably. °· Do not drive for 24 hours if you received a medicine to help you relax (sedative). °· If your health care provider took a tissue sample for testing during the procedure, make sure to get your test results. This is your responsibility. Ask your health care provider or the department performing the test when your results will be ready. °· Keep all follow-up visits as told   by your health care provider. This is important. °Contact a health care provider if: °· You cannot stop coughing. °· You are not urinating. °· You are urinating less than usual. °Get help right away if: °· You have trouble swallowing. °· You cannot eat or drink. °· You have throat or chest pain that gets worse. °· You are dizzy or light-headed. °· You faint. °· You have nausea or vomiting. °· You have chills. °· You have a fever. °· You  have severe abdominal pain. °· You have black, tarry, or bloody stools. °This information is not intended to replace advice given to you by your health care provider. Make sure you discuss any questions you have with your health care provider. °Document Released: 08/26/2012 Document Revised: 02/15/2016 Document Reviewed: 08/03/2015 °Elsevier Interactive Patient Education © 2018 Elsevier Inc. ° °

## 2017-05-23 NOTE — Anesthesia Preprocedure Evaluation (Addendum)
Anesthesia Evaluation  Patient identified by MRN, date of birth, ID band Patient awake    Reviewed: Allergy & Precautions, NPO status , Patient's Chart, lab work & pertinent test results  History of Anesthesia Complications (+) PONV and history of anesthetic complications  Airway Mallampati: II  TM Distance: >3 FB Neck ROM: Full    Dental  (+) Edentulous Upper, Edentulous Lower   Pulmonary sleep apnea and Oxygen sleep apnea , COPD,  COPD inhaler, Current Smoker,    breath sounds clear to auscultation       Cardiovascular hypertension, Pt. on medications and Pt. on home beta blockers + Peripheral Vascular Disease  + pacemaker  Rhythm:Regular     Neuro/Psych PSYCHIATRIC DISORDERS Anxiety Depression  Neuromuscular disease CVA, No Residual Symptoms    GI/Hepatic hiatal hernia, GERD  ,chron's   Endo/Other  diabetes, Type 2, Insulin Dependent  Renal/GU      Musculoskeletal  (+) Arthritis ,   Abdominal   Peds  Hematology negative hematology ROS (+)   Anesthesia Other Findings   Reproductive/Obstetrics                            Anesthesia Physical Anesthesia Plan  ASA: III  Anesthesia Plan: MAC   Post-op Pain Management:    Induction:   PONV Risk Score and Plan: 2 and Treatment may vary due to age or medical condition  Airway Management Planned: Nasal Cannula  Additional Equipment: None  Intra-op Plan:   Post-operative Plan:   Informed Consent: I have reviewed the patients History and Physical, chart, labs and discussed the procedure including the risks, benefits and alternatives for the proposed anesthesia with the patient or authorized representative who has indicated his/her understanding and acceptance.   Dental advisory given  Plan Discussed with: CRNA and Surgeon  Anesthesia Plan Comments:         Anesthesia Quick Evaluation

## 2017-05-23 NOTE — Op Note (Addendum)
[facility] Patient Name: [name] Procedure Date: 05/23/2017 MRN: [id number] Attending MD: Carlota Raspberry. Jahsir Rama MD, MD Date of Birth: [birth year] CSN: [id number] Age: 51 Admit Type: Outpatient Procedure:                Colonoscopy Indications:              Assess therapeutic response to therapy of Crohn's                            disease of the colon - severe Crohn's colitis on                            last colonoscopy, now on Remicade and Apriso Providers:                Carlota Raspberry. Antonia Jicha MD, MD, Laverta Baltimore RN,                            RN, Tinnie Gens, Technician Referring MD:              Medicines:                Monitored Anesthesia Care Complications:            No immediate complications. Estimated blood loss:                            Minimal. Estimated Blood Loss:     Estimated blood loss was minimal. Procedure:                Pre-Anesthesia Assessment:                           - Prior to the procedure, a History and Physical                            was performed, and patient medications and                            allergies were reviewed. The patient's tolerance of                            previous anesthesia was also reviewed. The risks                            and benefits of the procedure and the sedation                            options and risks were discussed with the patient.                            All questions were answered, and informed consent                            was obtained. Prior Anticoagulants: The patient has  taken Plavix (clopidogrel), last dose was 5 days                            prior to procedure. ASA Grade Assessment: III - A                            patient with severe systemic disease. After                            reviewing the risks and benefits, the patient was                            deemed in satisfactory condition to undergo the       procedure.                           After obtaining informed consent, the colonoscope                            was passed under direct vision. Throughout the                            procedure, the patient's blood pressure, pulse, and                            oxygen saturations were monitored continuously. The                            EC-[id number] ([id number]) scope was introduced through                            the anus and advanced to the the terminal ileum,                            with identification of the appendiceal orifice and                            IC valve. The colonoscopy was performed without                            difficulty. The patient tolerated the procedure                            well. The quality of the bowel preparation was                            good. The terminal ileum, ileocecal valve,                            appendiceal orifice, and rectum were photographed. Scope In: 10:08:16 AM Scope Out: 10:30:43 AM Scope Withdrawal Time: 0 hours 14 minutes 36 seconds  Total Procedure Duration: 0 hours 22 minutes 27 seconds  Findings:      The perianal and digital rectal examinations were normal.  The terminal ileum appeared normal.      Internal hemorrhoids were found during retroflexion.      The exam was otherwise normal throughout the examined colon. No active       inflammation noted. Interval healing of large ulcerations. Scarring       noted from prior ulcerations which have healed in the right and       transverse colon.      Biopsies were taken with a cold forceps in the sigmoid colon, in the       transverse colon and in the ascending colon for histology. Impression:               - The examined portion of the ileum was normal.                           - Internal hemorrhoids.                           - Interval healing of Crohn's colitis on Remicade -                            overall excellent response endoscopically to therapy                            - Biopsies were taken with a cold forceps for                            histology in the sigmoid colon, in the transverse                            colon and in the ascending colon. Moderate Sedation:      No moderate sedation, case performed with MAC Recommendation:           - Patient has a contact number available for                            emergencies. The signs and symptoms of potential                            delayed complications were discussed with the                            patient. Return to normal activities tomorrow.                            Written discharge instructions were provided to the                            patient.                           - Resume previous diet.                           - Continue present medications.                           -  Continue Remicade                           - Await pathology results.                           - Okay to stop Apriso for now                           - No advil NSAIDs                           - Resume Plavix tonight Procedure Code(s):        --- Professional ---                           [phone], Colonoscopy, flexible; with biopsy, single                            or multiple Diagnosis Code(s):        --- Professional ---                           K64.8, Other hemorrhoids                           K50.10, Crohn's disease of large intestine without                            complications CPT copyright 2016 American Medical Association. All rights reserved. The codes documented in this report are preliminary and upon coder review may  be revised to meet current compliance requirements. Remo Lipps P. Jkai Arwood MD, MD 05/23/2017 10:37:25 AM This report has been signed electronically. Number of Addenda: 0

## 2017-05-23 NOTE — Anesthesia Procedure Notes (Signed)
Procedure Name: MAC Date/Time: 05/23/2017 9:59 AM Performed by: Deliah Boston Pre-anesthesia Checklist: Patient identified, Emergency Drugs available, Suction available, Patient being monitored and Timeout performed Oxygen Delivery Method: Nasal cannula

## 2017-05-23 NOTE — H&P (Signed)
HPI :  51 y/o female with medical history below, with Crohn's disease on Remicade, and ongoing dysphagia. On Remicade and Apriso for Crohn's colitis, here for surveillance exam to assess for mucosal healing. She admits to taking ibuprofen daily in the interim which I was not aware of. She will stop this. Otherwise EGD with empiric dilation previously provided benefit to her dysphagia. Manometry without clear etiology. Given trial of diltiazem for elevated DCI noted on manometry, which did not help at all. She is asking for repeat dilation given it provided benefit previously. Last dose of Plavix on 5/24.  Past Medical History:  Diagnosis Date  . Anxiety   . Arthritis   . Cardiac pacemaker in situ 2009   DDD [employer] -- [place]TRU[place] 60  . Complication of anesthesia   . COPD with asthma (HCC)    GOLD 2-3 --  pulmologist (last visit 2011) Dr. Marchelle Gearing  . Crohn's disease (HCC)    Large intestine  . Depression 2016   PTSD  . Essential hypertension   . Family history of adverse reaction to anesthesia    father- stop breathing surgery   . Gastroparesis   . GERD (gastroesophageal reflux disease)   . History of hiatal hernia   . History of kidney stones   . History of stroke    Jun 2011 -- right hand weakness  . History of syncope   . Inappropriate sinus tachycardia    Sinus node modification 02-25-2003 by Dr. Lewayne Bunting  . LLQ abdominal tenderness 10/20/2015  . Neuromuscular disorder (HCC)    neuropathy   . OSA (obstructive sleep apnea)    Study done 2005 -- pt refused CPAP/previously was using nocturnal oxygen until one year ago pt states PCP is monitoring pt without  . Pelvic pain in female   . PONV (postoperative nausea and vomiting)   . Presence of permanent cardiac pacemaker   . PTSD (post-traumatic stress disorder)   . Sinus node dysfunction (HCC)    Symptomatic bradycardia  . Stroke [facility]) 2014   left leg weakness- uses leg brace   . Type 2 diabetes mellitus (HCC)     . Wears glasses      Past Surgical History:  Procedure Laterality Date  . 24 HOUR PH STUDY N/A 12/30/2016   Procedure: 24 HOUR PH STUDY;  Surgeon: Ruffin Frederick, MD;  Location: [facility];  Service: Gastroenterology;  Laterality: N/A;  . ABDOMINAL HYSTERECTOMY    . BILATERAL KNEE ARTHROSCOPY W/ CHONDROMALACIA PATELLA  bilateral ---- 12-26-2010; 08-14-2009;  04-25-2008;  03-09-2007   additional same surgery, Left knee 2005;  x2 2006 ---  Right knee 2005;  2006;  x2  2007  . CARDIAC CATHETERIZATION  05-08-2001  dr Nicki Guadalajara   normal coronary arteries and LVF  . CARDIAC ELECTROPHYSIOLOGY STUDY W/  SINUS NODE MODIFICATION  02-25-2003  dr Sharlot Gowda taylor  . CARDIAC PACEMAKER PLACEMENT  10-16-2007  dr Lewayne Bunting   DDD-- [facility] 60  . CARDIOVASCULAR STRESS TEST  08-02-2010  dr Diona Browner   normal lexiscan study/  ef 59%  . CESAREAN SECTION  x2  . CHOLECYSTECTOMY  1994  . COLONOSCOPY WITH PROPOFOL N/A 09/27/2016   Procedure: COLONOSCOPY WITH PROPOFOL;  Surgeon: Ruffin Frederick, MD;  Location: [facility];  Service: Gastroenterology;  Laterality: N/A;  . CYSTO/  URETEROSCOPIC STONE EXTRACTION  03/ 2005  . CYSTOSCOPY WITH HYDRODISTENSION AND BIOPSY N/A 05/23/2015   Procedure: CYSTOSCOPY/BIOPSY/HYDRODISTENSION;  Surgeon: Su Grand, MD;  Location:  [facility];  Service: Urology;  Laterality: N/A;  . ESOPHAGEAL MANOMETRY N/A 12/30/2016   Procedure: ESOPHAGEAL MANOMETRY (EM);  Surgeon: Ruffin Frederick, MD;  Location: [facility];  Service: Gastroenterology;  Laterality: N/A;  . ESOPHAGOGASTRODUODENOSCOPY (EGD) WITH PROPOFOL N/A 09/27/2016   Procedure: ESOPHAGOGASTRODUODENOSCOPY (EGD) WITH PROPOFOL;  Surgeon: Ruffin Frederick, MD;  Location: [facility];  Service: Gastroenterology;  Laterality: N/A;  . MULTIPLE EXTRACTIONS WITH ALVEOLOPLASTY    . TONSILLECTOMY  11-25-2002   Family History  Problem Relation Age of Onset  . Diabetes Mother    . Asthma Mother   . Heart disease Mother   . Cirrhosis Mother   . Stroke Mother   . Heart disease Father        Deceased. MI. Mother, 2 brothers, sister, nephew also have heart disease  . Emphysema Father        Died of it. Was pt of Dr. Sherene Sires   . Heart attack Father   . Cancer Father        ? type  . Diabetes Brother   . Heart disease Brother   . Diabetes Sister   . Cirrhosis Sister   . Cirrhosis Maternal Grandmother   . Diabetes Brother   . Heart attack Brother   . Heart disease Brother   . Seizures Brother   . Colon cancer Neg Hx   . Stomach cancer Neg Hx   . Esophageal cancer Neg Hx   . Rectal cancer Neg Hx   . Liver cancer Neg Hx    Social History  Substance Use Topics  . Smoking status: Current Every Day Smoker    Packs/day: 2.00    Years: 42.00    Types: Cigarettes    Start date: 02/02/1973  . Smokeless tobacco: Never Used     Comment: smoke about 2 packs because I recently lost my mom.  . Alcohol use No   Current Facility-Administered Medications  Medication Dose Route Frequency Provider Last Rate Last Dose  . 0.9 %  sodium chloride infusion   Intravenous Continuous Armbruster, Willaim Rayas, MD      . lactated ringers infusion   Intravenous Continuous Armbruster, Willaim Rayas, MD 10 mL/hr at 05/23/17 0912     Allergies  Allergen Reactions  . Flexeril [Cyclobenzaprine] Hives  . Amoxicillin Hives and Rash    Has patient had a PCN reaction causing immediate rash, facial/tongue/throat swelling, SOB or lightheadedness with hypotension: Yes Has patient had a PCN reaction causing severe rash involving mucus membranes or skin necrosis: Yes Has patient had a PCN reaction that required hospitalization No Has patient had a PCN reaction occurring within the last 10 years: Yes If all of the above answers are "NO", then may proceed with Cephalosporin use.      Review of Systems: All systems reviewed and negative except where noted in HPI.   Lab Results  Component Value  Date   WBC 7.8 03/13/2017   HGB 13.7 03/13/2017   HCT 41.8 03/13/2017   MCV 89.3 03/13/2017   PLT 200 03/13/2017   Lab Results  Component Value Date   CREATININE 0.80 03/13/2017   BUN 9 03/13/2017   NA 141 03/13/2017   K 3.7 03/13/2017   CL 108 03/13/2017   CO2 22 03/13/2017    Lab Results  Component Value Date   ALT 39 04/30/2017   AST 72 (H) 04/30/2017   ALKPHOS 109 04/30/2017   BILITOT 0.8 04/30/2017     Physical Exam: BP Marland Kitchen)  163/92   Temp 98.9 F (37.2 C) (Oral)   Resp 11   Ht 5\' 4"  (1.626 m)   Wt 215 lb (97.5 kg)   SpO2 95%   BMI 36.90 kg/m  Constitutional: Pleasant,well-developed,female in no acute distress. Cardiovascular: Normal rate, regular rhythm.  Pulmonary/chest: Effort normal and breath sounds normal. No wheezing, rales or rhonchi. Abdominal: Soft, protuberantnontender.  There are no masses palpable. No hepatomegaly. Extremities: no edema   ASSESSMENT AND PLAN: 51 y/o female with suspected Crohn's colitis on Remicade, here for surveillance colonoscopy. Also with history of dysphagia with benefit following empiric dilation in the past, now with recurrent dysphagia. I have offered her repeat EGD with dilation. I have discussed risks / benefits of endoscopy and anesthesia, she wishes to proceed.  Ileene Patrick, MD [facility] Pager [phone]

## 2017-05-27 ENCOUNTER — Encounter (HOSPITAL_COMMUNITY): Payer: Self-pay | Admitting: Gastroenterology

## 2017-05-27 ENCOUNTER — Telehealth: Payer: Self-pay

## 2017-05-27 NOTE — Anesthesia Postprocedure Evaluation (Signed)
Anesthesia Post Note  Patient: [name]  Procedure(s) Performed: Procedure(s) (LRB): COLONOSCOPY (N/A) ESOPHAGOGASTRODUODENOSCOPY (EGD) WITH PROPOFOL (N/A) SAVORY DILATION (N/A)     Patient location during evaluation: Endoscopy Anesthesia Type: MAC Level of consciousness: awake and alert Pain management: pain level controlled Vital Signs Assessment: post-procedure vital signs reviewed and stable Respiratory status: spontaneous breathing, nonlabored ventilation, respiratory function stable and patient connected to nasal cannula oxygen Cardiovascular status: blood pressure returned to baseline and stable Postop Assessment: no signs of nausea or vomiting Anesthetic complications: no    Last Vitals:  Vitals:   05/23/17 1040 05/23/17 1050  BP: (!) 123/44 133/69  Pulse: 98 93  Resp: (!) 31 (!) 22  Temp:    SpO2: 99% 97%    Last Pain:  Vitals:   05/23/17 0856  TempSrc: Oral                 Helia Haese

## 2017-05-27 NOTE — Telephone Encounter (Signed)
-----   Message from Steven P Armbruster, MD sent at 05/23/2017 11:26 AM EDT ----- Regarding: psychology referral Hi Julie, I did EGD / colonoscopy for Mrs. Dundon today. She is having some depression issues and difficulty with relationship with her son. She was not happy with the previous mental health provider she saw. She is looking to see someone for counseling. Can you refer her to a psychologist for further evaluation? Thanks  

## 2017-05-27 NOTE — Telephone Encounter (Signed)
Referral to [facility] in [place] done on 05/23/17. Called [facility] at [phone] and left that I was checking on status of this referral, asked that someone contact our office to let us know if patient is scheduled.

## 2017-05-28 ENCOUNTER — Encounter: Payer: Self-pay | Admitting: Gastroenterology

## 2017-05-28 ENCOUNTER — Telehealth (HOSPITAL_COMMUNITY): Payer: Self-pay | Admitting: *Deleted

## 2017-05-28 NOTE — Telephone Encounter (Signed)
left voice message regarding an appointment. 

## 2017-05-29 ENCOUNTER — Ambulatory Visit: Payer: Self-pay | Admitting: Family Medicine

## 2017-05-30 ENCOUNTER — Telehealth: Payer: Self-pay | Admitting: Gastroenterology

## 2017-05-30 NOTE — Telephone Encounter (Signed)
Spoke to patient and advised her to contact her caseworker to see who she can go to for a primary care. Gave her the name of two practices that accept her insurance: [facility] and [employer] [facility].

## 2017-06-04 ENCOUNTER — Ambulatory Visit (HOSPITAL_COMMUNITY): Payer: Medicaid Other

## 2017-06-05 ENCOUNTER — Ambulatory Visit (INDEPENDENT_AMBULATORY_CARE_PROVIDER_SITE_OTHER): Payer: Medicaid Other | Admitting: *Deleted

## 2017-06-05 DIAGNOSIS — I495 Sick sinus syndrome: Secondary | ICD-10-CM | POA: Diagnosis not present

## 2017-06-12 ENCOUNTER — Other Ambulatory Visit: Payer: Self-pay | Admitting: Cardiology

## 2017-06-12 ENCOUNTER — Other Ambulatory Visit: Payer: Self-pay | Admitting: "Endocrinology

## 2017-06-13 ENCOUNTER — Telehealth: Payer: Self-pay | Admitting: Gastroenterology

## 2017-06-13 NOTE — Telephone Encounter (Signed)
Patient states that directly after taking the Bentyl yesterday, she developed itchy, rash on all extremities. She did take Benadryl 50 mg, tried topical OTC treatments. She is still having problems today, has not taken the Bentyl today. Please advise. She also wanted to let you know that she is grateful for the referral to [facility].

## 2017-06-13 NOTE — Telephone Encounter (Signed)
Called patient - she thinks she is having erythematous rash due to bentyl. She tried holding her other medications and only taking Bentyl and she thinks it reliably causes a rash. I asked her to stop it and take benadryl as needed. If the rash persists despite not taking bentyl, could be related to something else she is taking, may want to call her primary care in that setting. She agreed.

## 2017-06-17 ENCOUNTER — Telehealth: Payer: Self-pay

## 2017-06-17 ENCOUNTER — Other Ambulatory Visit: Payer: Self-pay

## 2017-06-17 DIAGNOSIS — K5090 Crohn's disease, unspecified, without complications: Secondary | ICD-10-CM

## 2017-06-17 NOTE — Telephone Encounter (Signed)
-----   Message from Leverne Humbles, RN sent at 03/13/2017 10:40 AM EDT ----- Put Remicade orders in for [facility], needs pre-meds + zofran. Scheduled q 8 weeks. Last dose on 8/8. Due 10/3

## 2017-06-17 NOTE — Progress Notes (Signed)
Pacemaker check in clinic. Normal device function. Thresholds, sensing, impedances consistent with previous measurements. Device programmed to maximize longevity. (1) AT episode x 5sec. (1) high ventricular rate episode-AT per EGM. Device programmed at appropriate safety margins. Histogram distribution appropriate for patient activity level. Device programmed to optimize intrinsic conduction. Estimated longevity 1 year. Patient will follow up with DC/R in 3 months.

## 2017-06-17 NOTE — Telephone Encounter (Signed)
Orders placed for outpatient Remicade for [facility] medical day.

## 2017-06-19 ENCOUNTER — Ambulatory Visit (HOSPITAL_COMMUNITY): Payer: Self-pay | Admitting: Psychiatry

## 2017-06-19 ENCOUNTER — Encounter: Payer: Self-pay | Admitting: Gastroenterology
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
## 2017-06-20 ENCOUNTER — Other Ambulatory Visit: Payer: Self-pay

## 2017-06-20 DIAGNOSIS — K5090 Crohn's disease, unspecified, without complications: Secondary | ICD-10-CM

## 2017-06-25 ENCOUNTER — Ambulatory Visit (INDEPENDENT_AMBULATORY_CARE_PROVIDER_SITE_OTHER): Payer: Medicaid Other | Admitting: Psychiatry

## 2017-06-25 ENCOUNTER — Encounter (HOSPITAL_COMMUNITY): Payer: Self-pay | Admitting: Psychiatry

## 2017-06-25 ENCOUNTER — Encounter (HOSPITAL_COMMUNITY)
Admission: RE | Admit: 2017-06-25 | Discharge: 2017-06-25 | Disposition: A | Discharge status: Home / Self Care | Payer: Medicaid Other | Source: Ambulatory Visit | Attending: Gastroenterology | Admitting: Gastroenterology

## 2017-06-25 ENCOUNTER — Encounter (HOSPITAL_COMMUNITY): Payer: Self-pay

## 2017-06-25 VITALS — BP 143/92 | HR 66 | Ht 64.0 in | Wt 210.6 lb

## 2017-06-25 DIAGNOSIS — K5090 Crohn's disease, unspecified, without complications: Secondary | ICD-10-CM | POA: Diagnosis present

## 2017-06-25 DIAGNOSIS — G4733 Obstructive sleep apnea (adult) (pediatric): Secondary | ICD-10-CM

## 2017-06-25 DIAGNOSIS — I10 Essential (primary) hypertension: Secondary | ICD-10-CM | POA: Diagnosis not present

## 2017-06-25 DIAGNOSIS — G4700 Insomnia, unspecified: Secondary | ICD-10-CM | POA: Diagnosis not present

## 2017-06-25 DIAGNOSIS — F4310 Post-traumatic stress disorder, unspecified: Secondary | ICD-10-CM

## 2017-06-25 DIAGNOSIS — Z8673 Personal history of transient ischemic attack (TIA), and cerebral infarction without residual deficits: Secondary | ICD-10-CM

## 2017-06-25 DIAGNOSIS — Z950 Presence of cardiac pacemaker: Secondary | ICD-10-CM

## 2017-06-25 DIAGNOSIS — Z79899 Other long term (current) drug therapy: Secondary | ICD-10-CM | POA: Diagnosis not present

## 2017-06-25 DIAGNOSIS — J449 Chronic obstructive pulmonary disease, unspecified: Secondary | ICD-10-CM

## 2017-06-25 DIAGNOSIS — F331 Major depressive disorder, recurrent, moderate: Secondary | ICD-10-CM

## 2017-06-25 DIAGNOSIS — E119 Type 2 diabetes mellitus without complications: Secondary | ICD-10-CM

## 2017-06-25 DIAGNOSIS — F17210 Nicotine dependence, cigarettes, uncomplicated: Secondary | ICD-10-CM

## 2017-06-25 DIAGNOSIS — K219 Gastro-esophageal reflux disease without esophagitis: Secondary | ICD-10-CM

## 2017-06-25 MED ORDER — DULOXETINE HCL 30 MG PO CPEP: 30.0000 mg | ORAL_CAPSULE | Freq: Every day | ORAL | 1 refills | Status: DC

## 2017-06-25 MED ORDER — SODIUM CHLORIDE 0.9 % IV SOLN
INTRAVENOUS | Status: DC
  Administered 2017-06-25: 10:00:00 via INTRAVENOUS

## 2017-06-25 MED ORDER — DIPHENHYDRAMINE HCL 25 MG PO CAPS
50.0000 mg | ORAL_CAPSULE | Freq: Once | ORAL | Status: AC
  Administered 2017-06-25: 50 mg via ORAL
  Filled 2017-06-25: qty 2

## 2017-06-25 MED ORDER — ACETAMINOPHEN 325 MG PO TABS
650.0000 mg | ORAL_TABLET | Freq: Once | ORAL | Status: AC
  Administered 2017-06-25: 650 mg via ORAL
  Filled 2017-06-25: qty 2

## 2017-06-25 MED ORDER — SODIUM CHLORIDE 0.9 % IV SOLN
5.0000 mg/kg | INTRAVENOUS | Status: DC
  Administered 2017-06-25: 500 mg via INTRAVENOUS
  Filled 2017-06-25: qty 50

## 2017-06-25 MED ORDER — ONDANSETRON 4 MG PO TBDP
4.0000 mg | ORAL_TABLET | Freq: Once | ORAL | Status: AC
  Administered 2017-06-25: 4 mg via ORAL
  Filled 2017-06-25: qty 1

## 2017-06-25 NOTE — Patient Instructions (Addendum)
1. Start duloxetine 30 mg daily 2. Start belsomra 10 mg at night as needed for sleep 3. Return to clinic in one month for 30 mins 4. Referral to therapy

## 2017-06-26 ENCOUNTER — Ambulatory Visit (HOSPITAL_COMMUNITY): Payer: Self-pay

## 2017-06-30 ENCOUNTER — Telehealth: Payer: Self-pay | Admitting: Gastroenterology

## 2017-06-30 ENCOUNTER — Other Ambulatory Visit: Payer: Self-pay

## 2017-06-30 DIAGNOSIS — R197 Diarrhea, unspecified: Secondary | ICD-10-CM

## 2017-06-30 NOTE — Telephone Encounter (Signed)
Spoke to patient, advised to go get labs done today. She will need to wait on a ride. She prefers [name] as that is closer to her house. She states she feels very weak, advised to drink Gatorade and if she feels worse then she may need to go to ED for further evaluation.

## 2017-06-30 NOTE — Telephone Encounter (Signed)
Patient has had diarrhea, abdominal pain, feels feverish since her infusion last Wednesday. She is trying to eat/drink but everything keeps running through.

## 2017-06-30 NOTE — Telephone Encounter (Signed)
Sorry to hear she is feeling poorly. Recommend CBC, CMET and also C diff for stool testing to ensure negative. Once I have this result will give her further recommendations. Her last colonoscopy looked much better on Remicade, not sure if this is her Crohn's versus something else, need to rule out C diff first. Thanks

## 2017-07-02 ENCOUNTER — Other Ambulatory Visit (HOSPITAL_COMMUNITY)
Admission: RE | Admit: 2017-07-02 | Discharge: 2017-07-02 | Disposition: A | Discharge status: Home / Self Care | Payer: Medicaid Other | Source: Ambulatory Visit | Attending: Gastroenterology | Admitting: Gastroenterology

## 2017-07-02 DIAGNOSIS — R197 Diarrhea, unspecified: Secondary | ICD-10-CM | POA: Diagnosis not present

## 2017-07-02 LAB — CBC WITH DIFFERENTIAL/PLATELET
Basophils Absolute: 0.1 10*3/uL (ref 0.0–0.1)
Basophils Relative: 1 %
EOS ABS: 0.2 10*3/uL (ref 0.0–0.7)
EOS PCT: 2 %
HCT: 47.6 % — ABNORMAL HIGH (ref 36.0–46.0)
Hemoglobin: 16 g/dL — ABNORMAL HIGH (ref 12.0–15.0)
LYMPHS ABS: 4.1 10*3/uL — AB (ref 0.7–4.0)
Lymphocytes Relative: 47 %
MCH: 31.4 pg (ref 26.0–34.0)
MCHC: 33.6 g/dL (ref 30.0–36.0)
MCV: 93.3 fL (ref 78.0–100.0)
Monocytes Absolute: 0.5 10*3/uL (ref 0.1–1.0)
Monocytes Relative: 6 %
Neutro Abs: 3.8 10*3/uL (ref 1.7–7.7)
Neutrophils Relative %: 44 %
PLATELETS: 198 10*3/uL (ref 150–400)
RBC: 5.1 MIL/uL (ref 3.87–5.11)
RDW: 13.3 % (ref 11.5–15.5)
WBC: 8.6 10*3/uL (ref 4.0–10.5)

## 2017-07-02 LAB — COMPREHENSIVE METABOLIC PANEL
ALBUMIN: 4.3 g/dL (ref 3.5–5.0)
ALK PHOS: 135 U/L — AB (ref 38–126)
ALT: 50 U/L (ref 14–54)
AST: 85 U/L — AB (ref 15–41)
Anion gap: 16 — ABNORMAL HIGH (ref 5–15)
BUN: 7 mg/dL (ref 6–20)
CALCIUM: 9.8 mg/dL (ref 8.9–10.3)
CHLORIDE: 107 mmol/L (ref 101–111)
CO2: 17 mmol/L — AB (ref 22–32)
CREATININE: 0.94 mg/dL (ref 0.44–1.00)
GFR calc non Af Amer: >60 mL/min (ref >60)
GLUCOSE: 414 mg/dL — AB (ref 65–99)
Potassium: 3.1 mmol/L — ABNORMAL LOW (ref 3.5–5.1)
SODIUM: 140 mmol/L (ref 135–145)
Total Bilirubin: 0.6 mg/dL (ref 0.3–1.2)
Total Protein: 8.5 g/dL — ABNORMAL HIGH (ref 6.5–8.1)

## 2017-07-02 LAB — C DIFFICILE QUICK SCREEN W PCR REFLEX
C DIFFICILE (CDIFF) TOXIN: NEGATIVE
C Diff antigen: NEGATIVE
C Diff interpretation: NOT DETECTED

## 2017-07-07 ENCOUNTER — Telehealth: Payer: Self-pay | Admitting: Cardiology

## 2017-07-07 ENCOUNTER — Telehealth: Payer: Self-pay | Admitting: Gastroenterology

## 2017-07-07 NOTE — Telephone Encounter (Signed)
Patient returning call to [place] about lab results from 10.10.18.

## 2017-07-07 NOTE — Telephone Encounter (Signed)
Spoke to patient, I had left her a detailed message last week on stopping her lasix, using imodium prn to help with diarrhea. Today she states she did not stop the lasix and wanted to discuss stopping the lasix with her cardiologist at her appointment tomorrow. I asked her to contact them today to let them know what her lab results were and Dr. Lanetta Inch recommendation. She is still having diarrhea, only able to drink liquids. Encouraged her to eat some food, if she becomes symptomatic she needs to go to the ED. I told her I would let Dr. Adela Lank know how she was doing.

## 2017-07-07 NOTE — Telephone Encounter (Signed)
Returned pat call. No answer. Left message for pt to return call.

## 2017-07-07 NOTE — Telephone Encounter (Signed)
Pt lvm stating her Gastro Dr called stating her Potassium is low--wanted Dr. Diona Browner to be aware. Please give pt a call @ [phone]

## 2017-07-08 ENCOUNTER — Encounter: Payer: Self-pay | Admitting: Cardiology

## 2017-07-08 ENCOUNTER — Ambulatory Visit (INDEPENDENT_AMBULATORY_CARE_PROVIDER_SITE_OTHER): Payer: Medicaid Other | Admitting: Cardiology

## 2017-07-08 VITALS — BP 152/88 | HR 98 | Ht 64.0 in | Wt 207.0 lb

## 2017-07-08 DIAGNOSIS — Z23 Encounter for immunization: Secondary | ICD-10-CM

## 2017-07-08 DIAGNOSIS — E876 Hypokalemia: Secondary | ICD-10-CM | POA: Diagnosis not present

## 2017-07-08 DIAGNOSIS — I495 Sick sinus syndrome: Secondary | ICD-10-CM | POA: Diagnosis not present

## 2017-07-08 DIAGNOSIS — I10 Essential (primary) hypertension: Secondary | ICD-10-CM

## 2017-07-08 DIAGNOSIS — R600 Localized edema: Secondary | ICD-10-CM | POA: Diagnosis not present

## 2017-07-08 DIAGNOSIS — Z79899 Other long term (current) drug therapy: Secondary | ICD-10-CM | POA: Diagnosis not present

## 2017-07-08 MED ORDER — CARVEDILOL 3.125 MG PO TABS: 3.1250 mg | ORAL_TABLET | Freq: Two times a day (BID) | ORAL | 3 refills | Status: DC

## 2017-07-08 MED ORDER — CARVEDILOL 6.25 MG PO TABS: 6.2500 mg | ORAL_TABLET | Freq: Two times a day (BID) | ORAL | 3 refills | Status: AC

## 2017-07-08 MED ORDER — POTASSIUM CHLORIDE ER 10 MEQ PO TBCR
10.0000 meq | EXTENDED_RELEASE_TABLET | Freq: Every day | ORAL | 3 refills | Status: AC
Start: 2017-07-08 — End: 2017-11-03

## 2017-07-08 MED ORDER — FUROSEMIDE 40 MG PO TABS: ORAL_TABLET | ORAL | 3 refills | Status: AC

## 2017-07-08 NOTE — Addendum Note (Signed)
Addended by: Marlyn Corporal A on: 07/08/2017 02:32 PM   Modules accepted: Orders

## 2017-07-08 NOTE — Progress Notes (Signed)
Cardiology Office Note  Date: 07/08/2017   ID: [name], DOB 1966/08/02, MRN [id number]  PCP: [facility]  Primary Cardiologist: Nona Dell, MD   Chief Complaint  Patient presents with  . Cardiac follow-up    History of Present Illness: [name] is a 51 y.o. female last seen in May. She presents today with her brother for a follow-up visit. She states that she has been weak recently, no palpitations or chest pain. She continues to follow with GI and had recent lab work, noted to have hypokalemia with potassium 3.1. She continues to use Lasix for intermittent leg swelling. Her legs are not swollen today.  She reports that her blood pressure has been running up. Systolic in the 150s today. She had previously been on Coreg but there was some discussion about switching her to diltiazem per GI recommendations for possible esophageal spasm. Patient states that this did not help and she ultimately underwent an EGD with dilatation. She is not back on Coreg.  She continues to follow in the device with Dr. [name], [facility] scientific pacemaker in place with history of inappropriate sinus tachycardia and sinus node modification. Device interrogation in September showed normal function.  Past Medical History:  Diagnosis Date  . Anxiety   . Arthritis   . Cardiac pacemaker in situ 2009   DDD [employer] -- [place] 60  . Complication of anesthesia   . COPD with asthma (HCC)    GOLD 2-3 --  pulmologist (last visit 2011) Dr. Marchelle Gearing  . Crohn's disease (HCC)    Large intestine  . Depression 2016   PTSD  . Essential hypertension   . Family history of adverse reaction to anesthesia    father- stop breathing surgery   . Gastroparesis   . GERD (gastroesophageal reflux disease)   . History of hiatal hernia   . History of kidney stones   . History of stroke    Jun 2011 -- right hand weakness  . History of syncope   . Inappropriate sinus tachycardia     Sinus node modification 02-25-2003 by Dr. Lewayne Bunting  . LLQ abdominal tenderness 10/20/2015  . Neuromuscular disorder (HCC)    neuropathy   . OSA (obstructive sleep apnea)    Study done 2005 -- pt refused CPAP/previously was using nocturnal oxygen until one year ago pt states PCP is monitoring pt without  . Pelvic pain in female   . PONV (postoperative nausea and vomiting)   . Presence of permanent cardiac pacemaker   . PTSD (post-traumatic stress disorder)   . Sinus node dysfunction (HCC)    Symptomatic bradycardia  . Stroke [facility]) 2014   left leg weakness- uses leg brace   . Type 2 diabetes mellitus (HCC)   . Wears glasses     Past Surgical History:  Procedure Laterality Date  . 24 HOUR PH STUDY N/A 12/30/2016   Procedure: 24 HOUR PH STUDY;  Surgeon: Ruffin Frederick, MD;  Location: [facility];  Service: Gastroenterology;  Laterality: N/A;  . ABDOMINAL HYSTERECTOMY    . BILATERAL KNEE ARTHROSCOPY W/ CHONDROMALACIA PATELLA  bilateral ---- 12-26-2010; 08-14-2009;  04-25-2008;  03-09-2007   additional same surgery, Left knee 2005;  x2 2006 ---  Right knee 2005;  2006;  x2  2007  . CARDIAC CATHETERIZATION  05-08-2001  dr Nicki Guadalajara   normal coronary arteries and LVF  . CARDIAC ELECTROPHYSIOLOGY STUDY W/  SINUS NODE MODIFICATION  02-25-2003  dr Sharlot Gowda taylor  .  CARDIAC PACEMAKER PLACEMENT  10-16-2007  dr Lewayne Bunting   DDD-- [facility] 60  . CARDIOVASCULAR STRESS TEST  08-02-2010  dr Diona Browner   normal lexiscan study/  ef 59%  . CESAREAN SECTION  x2  . CHOLECYSTECTOMY  1994  . COLONOSCOPY N/A 05/23/2017   Procedure: COLONOSCOPY;  Surgeon: Benancio Deeds, MD;  Location: [facility];  Service: Gastroenterology;  Laterality: N/A;  . COLONOSCOPY WITH PROPOFOL N/A 09/27/2016   Procedure: COLONOSCOPY WITH PROPOFOL;  Surgeon: Ruffin Frederick, MD;  Location: [facility];  Service: Gastroenterology;  Laterality: N/A;  . CYSTO/  URETEROSCOPIC STONE  EXTRACTION  03/ 2005  . CYSTOSCOPY WITH HYDRODISTENSION AND BIOPSY N/A 05/23/2015   Procedure: CYSTOSCOPY/BIOPSY/HYDRODISTENSION;  Surgeon: Su Grand, MD;  Location: [facility];  Service: Urology;  Laterality: N/A;  . ESOPHAGEAL MANOMETRY N/A 12/30/2016   Procedure: ESOPHAGEAL MANOMETRY (EM);  Surgeon: Ruffin Frederick, MD;  Location: [facility];  Service: Gastroenterology;  Laterality: N/A;  . ESOPHAGOGASTRODUODENOSCOPY (EGD) WITH PROPOFOL N/A 09/27/2016   Procedure: ESOPHAGOGASTRODUODENOSCOPY (EGD) WITH PROPOFOL;  Surgeon: Ruffin Frederick, MD;  Location: [facility];  Service: Gastroenterology;  Laterality: N/A;  . ESOPHAGOGASTRODUODENOSCOPY (EGD) WITH PROPOFOL N/A 05/23/2017   Procedure: ESOPHAGOGASTRODUODENOSCOPY (EGD) WITH PROPOFOL;  Surgeon: Benancio Deeds, MD;  Location: [facility];  Service: Gastroenterology;  Laterality: N/A;  . MULTIPLE EXTRACTIONS WITH ALVEOLOPLASTY    . SAVORY DILATION N/A 05/23/2017   Procedure: SAVORY DILATION;  Surgeon: Benancio Deeds, MD;  Location: [facility];  Service: Gastroenterology;  Laterality: N/A;  . TONSILLECTOMY  11-25-2002    Current Outpatient Prescriptions  Medication Sig Dispense Refill  . albuterol (PROVENTIL) (2.5 MG/3ML) 0.083% nebulizer solution Take 3 mLs (2.5 mg total) by nebulization every 6 (six) hours as needed for wheezing or shortness of breath. 75 mL 12  . aspirin EC 81 MG tablet Take 81 mg by mouth daily.    . BD PEN NEEDLE NANO U/F 32G X 4 MM MISC 1 each by Does not apply route 4 (four) times daily. 120 each 0  . Cholecalciferol (VITAMIN D3) 5000 units CAPS Take 1 capsule (5,000 Units total) by mouth daily. 90 capsule 0  . clopidogrel (PLAVIX) 75 MG tablet Take 1 tablet (75 mg total) by mouth daily. 30 tablet 6  . colestipol (COLESTID) 1 g tablet Take 1 tablet (1 g total) by mouth 2 (two) times daily. 90 tablet 3  . diclofenac sodium (VOLTAREN) 1 % GEL Apply 4 g topically 4 (four) times  daily. 200 g 11  . dicyclomine (BENTYL) 20 MG tablet Take 1 tablet (20 mg total) by mouth every 8 (eight) hours as needed for spasms. 90 tablet 3  . DULoxetine (CYMBALTA) 30 MG capsule Take 1 capsule (30 mg total) by mouth daily. 30 capsule 1  . furosemide (LASIX) 40 MG tablet TAKE (1) TABLET BY MOUTH EACH MORNING. 90 tablet 3  . gemfibrozil (LOPID) 600 MG tablet TAKE 1 TABLET TWICE DAILY BEFORE MEALS. 60 tablet 2  . glucose blood (ACCU-CHEK GUIDE) test strip Use as instructed 4 x daily. E11.65 150 each 5  . ibuprofen (ADVIL,MOTRIN) 200 MG tablet Take 200 mg by mouth every 6 (six) hours as needed for moderate pain.    Marland Kitchen inFLIXimab 500 mg in sodium chloride 0.9 % 200 mL Inject 500 mg into the vein every 8 (eight) weeks. 1 Dose 6  . insulin aspart (NOVOLOG FLEXPEN) 100 UNIT/ML FlexPen Inject 15-21 Units into the skin 3 (three) times daily with  meals. 5 pen 2  . INVOKANA 100 MG TABS tablet TAKE 1 TABLET BY MOUTH DAILY BEFORE BREAKFAST. 30 tablet 2  . LEVEMIR FLEXTOUCH 100 UNIT/ML Pen INJECT 80 UNITS UNDER THE SKIN EVERY DAY AT 10 PM 30 mL 2  . losartan (COZAAR) 25 MG tablet Take 1 tablet (25 mg total) by mouth daily. 30 tablet 3  . LYRICA 150 MG capsule take  by mouth three times daily  0  . metFORMIN (GLUCOPHAGE) 1000 MG tablet TAKE 1 TABLET(1000 MG) BY MOUTH TWICE DAILY WITH A MEAL 60 tablet 3  . mirtazapine (REMERON) 15 MG tablet Take 1 tablet (15 mg total) by mouth at bedtime. 30 tablet 1  . nitroGLYCERIN (NITROSTAT) 0.4 MG SL tablet Place 1 tablet (0.4 mg total) under the tongue every 5 (five) minutes as needed for chest pain. 30 tablet 0  . OXYGEN Inhale 3 L/day into the lungs at bedtime.    . pantoprazole (PROTONIX) 40 MG tablet Take 1 tablet (40 mg total) by mouth 2 (two) times daily. 90 tablet 3  . pramipexole (MIRAPEX) 0.25 MG tablet Take 0.5 mg by mouth 3 (three) times daily.    . simvastatin (ZOCOR) 10 MG tablet TAKE (1) TABLET BY MOUTH DAILY. 90 tablet 2  . SYMBICORT 160-4.5  MCG/ACT inhaler INHALE 2 PUFFS INTO THE LUNGS EVERY 12 HOURS 10.2 g 0  . tapentadol (NUCYNTA ER) 100 MG 12 hr tablet Take 100 mg by mouth every 12 (twelve) hours.    Marland Kitchen tiZANidine (ZANAFLEX) 4 MG capsule Take 4 mg by mouth 3 (three) times daily.    . carvedilol (COREG) 3.125 MG tablet Take 1 tablet (3.125 mg total) by mouth 2 (two) times daily. 180 tablet 3  . potassium chloride (K-DUR) 10 MEQ tablet Take 1 tablet (10 mEq total) by mouth daily. 90 tablet 3   No current facility-administered medications for this visit.    Allergies:  Flexeril [cyclobenzaprine] and Amoxicillin   Social History: The patient  reports that she has been smoking Cigarettes.  She started smoking about 44 years ago. She has a 84.00 pack-year smoking history. She has never used smokeless tobacco. She reports that she does not drink alcohol or use drugs.   ROS:  Please see the history of present illness. Otherwise, complete review of systems is positive for fatigue, intermittent abdominal pain, intermittent leg swelling anxiety.  All other systems are reviewed and negative.   Physical Exam: VS:  BP (!) 152/88   Pulse 98   Ht  (1.626 m)   Wt 207 lb (93.9 kg)   SpO2 100%   BMI 35.53 kg/m , BMI Body mass index is 35.53 kg/m.  Wt Readings from Last 3 Encounters:  07/08/17 207 lb (93.9 kg)  06/25/17 214 lb (97.1 kg)  05/23/17 215 lb (97.5 kg)    General: Obese woman appears comfortable at rest. HEENT: Conjunctiva and lids normal, oropharynx clear with poor dentition. Neck: Supple, no elevated JVP or carotid bruits, no thyromegaly. Lungs: Clear to auscultation, nonlabored breathing at rest. Cardiac: Regular rate and rhythm, no S3 or significant systolic murmur, no pericardial rub. Abdomen: Soft, nontender, bowel sounds present. Extremities: No pitting edema, distal pulses 2+. Skin: Warm and dry. Scattered tattoos. Musculoskeletal: No kyphosis. Neuropsychiatric: Alert and oriented x3, affect grossly  appropriate.  ECG: I personally reviewed the tracing from 02/06/2017 which showed sinus rhythm with poor R wave progression.  Recent Labwork: 11/05/2016: TSH 0.308 07/02/2017: ALT 50; AST 85; BUN 7; Creatinine, Ser  0.94; Hemoglobin 16.0; Platelets 198; Potassium 3.1; Sodium 140     Component Value Date/Time   CHOL 186 11/05/2016 1300   CHOL 276 (H) 07/07/2015 1052   TRIG 130 11/05/2016 1300   TRIG 422 (H) 01/30/2015 1329   HDL 87 11/05/2016 1300   HDL 42 07/07/2015 1052   HDL 46 01/30/2015 1329   CHOLHDL 2.1 11/05/2016 1300   VLDL 26 11/05/2016 1300   LDLCALC 73 11/05/2016 1300   LDLCALC 162 (H) 07/07/2015 1052    Other Studies Reviewed Today:  Echocardiogram 11/26/2016: Study Conclusions  - Left ventricle: The cavity size was normal. Wall thickness was   increased in a pattern of mild LVH. Systolic function was normal.   The estimated ejection fraction was in the range of 60% to 65%.   Wall motion was normal; there were no regional wall motion   abnormalities. Left ventricular diastolic function parameters   were normal. - Aortic valve: Valve area (VTI): 2.58 cm^2. Valve area (Vmax): 2.8   cm^2. - Right atrium: RA lead is very mobile, consider correlating   positioning by chest xray - Atrial septum: No defect or patent foramen ovale was identified. - Pulmonary arteries: Systolic pressure was mildly increased. PA   peak pressure: 32 mm Hg (S). - Technically adequate study.  Assessment and Plan:  1. History of intermittent leg edema. LVEF normal by echocardiogram earlier in the year as was diastolic function. We discussed sodium restriction in general. She uses Lasix, potassium supplements will be added due to recent hypokalemia. Start KCl 10 mEq daily. Recheck BMET in 2 weeks.  2. Essential hypertension. Continue with current regimen, add back Coreg starting at 6.25 mg twice daily.  3. Inappropriate sinus tachycardia status post sinus node modification and ultimately  placement of [employer] pacemaker, follows with Dr. [name]. Device function normal by recent interrogation in September.  4. History of GERD withesophageal spasm and dysphagia, now status post EGD and dilatation. She continues to follow with Dr. Adela Lank.  Current medicines were reviewed with the patient today.   Orders Placed This Encounter  Procedures  . Basic Metabolic Panel (BMET)    Disposition: Follow-up in 3 months.  Signed, Jonelle Sidle, MD, [facility] 07/08/2017 1:25 PM    [facility] at [facility] [address], [place], [place] [address] Phone: [phone]; Fax: [phone]

## 2017-07-08 NOTE — Patient Instructions (Addendum)
Your physician recommends that you schedule a follow-up appointment in: 3 months with Dr.McDowell     START Coreg 6.25 mg twice a day    START Potassium 10 meq daily      Get lab work in 2 weeks:BMET    Thank you for choosing [facility] !

## 2017-07-08 NOTE — Telephone Encounter (Signed)
Okay, defer to cardiology if they want to hold lasix or give her a K supplement, sounds like she is seeing them today. Her last colonoscopy and labs otherwise look good - she has had healing of her Crohn's on Remicade. She should advance diet as tolerated, use immodium for diarrhea, if no improvement in the next few days she should call back

## 2017-07-09 ENCOUNTER — Ambulatory Visit (HOSPITAL_COMMUNITY): Payer: Self-pay | Admitting: Licensed Clinical Social Worker

## 2017-07-09 NOTE — Telephone Encounter (Signed)
Pt spoke with Dr. Diona Browner at office visit yesterday.

## 2017-07-09 NOTE — Telephone Encounter (Deleted)
Spoke with pt., she stated she was told by her Gastro Dr. Claiborne Billings her potassium was low and to hold off on taking her fluid pill

## 2017-07-10 ENCOUNTER — Other Ambulatory Visit: Payer: Self-pay | Admitting: Gastroenterology

## 2017-07-10 NOTE — Telephone Encounter (Signed)
I called home and cell phone #, can't get through or leave a message, I will call her back

## 2017-07-10 NOTE — Telephone Encounter (Signed)
Called the patient. She is having diarrhea, multiple stools per day. Not much abdominal pain.  C diff tested negative last week. She just had a colonoscopy while on Remicade which showed interval healing and looked much better. Not sure if this is functional or not. Immodium hasn't helped too much. Recommend a trial of Motofen - 2 tabs x 1, then 1 tab every 4 hrs PRN, max of 8 tabs in 24 hour time frame. If no improvement on this she should call us back in a few days. She should stop immodium while taking this. Hopefully this helps, if not, may need to consider a flex sig to rule out IBD flare. She agreed. I called the order into the pharmacy for her given this is a controlled substance.   [name]

## 2017-07-10 NOTE — Telephone Encounter (Signed)
Spoke to patient and she is still having diarrhea every time she eats or drinks anything. She is taking Imodium but this is not helping. She also wanted to let you know that she will be seeing the health department in [place] as her PCP, and she is not seeing the pain clinic any more as she felt they were keeping her too medicated. Cardiology is managing lasix and K supplement.

## 2017-07-11 ENCOUNTER — Telehealth: Payer: Self-pay | Admitting: Gastroenterology

## 2017-07-11 NOTE — Telephone Encounter (Signed)
Dr Adela Lank- Per pharmacy, medicaid will not pay for motofen. Are you okay with [place] trying for lomotil instead?

## 2017-07-13 NOTE — Telephone Encounter (Signed)
Yes that's fine if needed, we can use lomotil. Alternatively if we have a sample of motofen in the office (we should) we can give that to her. You would mind checking with leslie to see if we have any samples? Thanks

## 2017-07-14 ENCOUNTER — Other Ambulatory Visit: Payer: Self-pay | Admitting: "Endocrinology

## 2017-07-14 MED ORDER — DIPHENOXYLATE-ATROPINE 2.5-0.025 MG PO TABS: ORAL_TABLET | ORAL | 1 refills | Status: AC

## 2017-07-14 NOTE — Telephone Encounter (Signed)
Per Dr Adela Lank- He would like patient to be given 12 tablets of Motofen samples to try. If these are effective for her diarrhea, we can then prescribe lomotil prescription (since her insurance will not cover motofen).  I have contacted patient to advise of this. She states she does not have the means to come to [place] to get samples of motofen. She would rather just have a prescription of lomotil sent to pharmacy. Dr Adela Lank, please advise.

## 2017-07-14 NOTE — Telephone Encounter (Signed)
Yes same sig. I think there is a new package on my best, may be Motofen if you wouldn't mind checking. If it is, we can give it to her. Thanks

## 2017-07-14 NOTE — Telephone Encounter (Signed)
Rx sent for lomotil 2 tabs x 1, then 1 tab every 4 hrs PRN, max of 8 tabs in 24 hour time.  Patient advised of this.

## 2017-07-14 NOTE — Telephone Encounter (Signed)
We have no motofen. Same sig for the lomotil as would be for motofen?

## 2017-07-14 NOTE — Telephone Encounter (Signed)
Ok, the motofen was in [place]. I can provide her with 40 tabs of motofen, but after that, she would need lomotil. Do you want me to provide her with all the motofen samples you have or just a portion?

## 2017-07-14 NOTE — Telephone Encounter (Signed)
Okay we can use the lomotil then if she declined sample of Motofen. Thanks for your help
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
## 2017-07-24 ENCOUNTER — Ambulatory Visit (INDEPENDENT_AMBULATORY_CARE_PROVIDER_SITE_OTHER): Payer: Medicaid Other | Admitting: Psychiatry

## 2017-07-24 ENCOUNTER — Encounter (HOSPITAL_COMMUNITY): Payer: Self-pay | Admitting: Psychiatry

## 2017-07-24 VITALS — BP 156/89 | HR 60 | Ht 64.0 in | Wt 207.0 lb

## 2017-07-24 DIAGNOSIS — F331 Major depressive disorder, recurrent, moderate: Secondary | ICD-10-CM

## 2017-07-24 DIAGNOSIS — F4310 Post-traumatic stress disorder, unspecified: Secondary | ICD-10-CM

## 2017-07-24 MED ORDER — LORAZEPAM 0.5 MG PO TABS: 0.5000 mg | ORAL_TABLET | Freq: Every day | ORAL | 0 refills | Status: DC | PRN

## 2017-07-24 MED ORDER — DULOXETINE HCL 60 MG PO CPEP: 60.0000 mg | ORAL_CAPSULE | Freq: Every day | ORAL | 0 refills | Status: DC

## 2017-07-24 NOTE — Patient Instructions (Signed)
1. Increase duloxetine 60 mg daily  2. Start lorazepam 0.5 mg daily as needed for sleep 3. Return to clinic in one month for 30 mins

## 2017-07-29 ENCOUNTER — Other Ambulatory Visit: Payer: Self-pay | Admitting: Family Medicine

## 2017-07-29 DIAGNOSIS — Z1231 Encounter for screening mammogram for malignant neoplasm of breast: Secondary | ICD-10-CM

## 2017-07-31 ENCOUNTER — Ambulatory Visit (HOSPITAL_COMMUNITY): Payer: Self-pay

## 2017-08-01 ENCOUNTER — Other Ambulatory Visit (HOSPITAL_COMMUNITY)
Admission: RE | Admit: 2017-08-01 | Discharge: 2017-08-01 | Disposition: A | Discharge status: Home / Self Care | Payer: Medicaid Other | Source: Ambulatory Visit | Attending: Cardiology | Admitting: Cardiology

## 2017-08-01 DIAGNOSIS — Z79899 Other long term (current) drug therapy: Secondary | ICD-10-CM | POA: Insufficient documentation

## 2017-08-01 LAB — BASIC METABOLIC PANEL
Anion gap: 9 (ref 5–15)
BUN: 14 mg/dL (ref 6–20)
CO2: 25 mmol/L (ref 22–32)
CREATININE: 0.7 mg/dL (ref 0.44–1.00)
Calcium: 9.4 mg/dL (ref 8.9–10.3)
Chloride: 104 mmol/L (ref 101–111)
GFR calc Af Amer: >60 mL/min (ref >60)
GLUCOSE: 255 mg/dL — AB (ref 65–99)
Potassium: 4 mmol/L (ref 3.5–5.1)
SODIUM: 138 mmol/L (ref 135–145)

## 2017-08-07 ENCOUNTER — Ambulatory Visit (HOSPITAL_COMMUNITY): Payer: Self-pay | Admitting: Licensed Clinical Social Worker

## 2017-08-12 ENCOUNTER — Other Ambulatory Visit: Payer: Self-pay | Admitting: "Endocrinology

## 2017-08-13 ENCOUNTER — Ambulatory Visit: Payer: Self-pay | Admitting: Gastroenterology

## 2017-08-13 ENCOUNTER — Telehealth: Payer: Self-pay

## 2017-08-13 ENCOUNTER — Other Ambulatory Visit: Payer: Self-pay

## 2017-08-13 DIAGNOSIS — K5090 Crohn's disease, unspecified, without complications: Secondary | ICD-10-CM

## 2017-08-13 NOTE — Telephone Encounter (Signed)
Orders placed for next Remicade infusion.

## 2017-08-13 NOTE — Telephone Encounter (Signed)
-----   Message from Leverne HumblesJulia M Fournier, RN sent at 06/17/2017  3:35 PM EDT ----- Place usual [facility] for Remicade + zofran 4 mg, last done 10/3 due 11/28.

## 2017-08-18 NOTE — Progress Notes (Signed)
No show letter sent.
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
## 2017-08-20 ENCOUNTER — Encounter (HOSPITAL_COMMUNITY): Admission: RE | Admit: 2017-08-20 | Payer: Medicaid Other | Source: Ambulatory Visit

## 2017-08-22 ENCOUNTER — Other Ambulatory Visit: Payer: Self-pay | Admitting: "Endocrinology

## 2017-08-25 ENCOUNTER — Ambulatory Visit (HOSPITAL_COMMUNITY): Payer: Self-pay | Admitting: Psychiatry

## 2017-08-25 ENCOUNTER — Encounter (HOSPITAL_COMMUNITY): Payer: Self-pay

## 2017-08-26 ENCOUNTER — Encounter (HOSPITAL_COMMUNITY): Payer: Self-pay

## 2017-08-26 ENCOUNTER — Encounter (HOSPITAL_COMMUNITY)
Admission: RE | Admit: 2017-08-26 | Discharge: 2017-08-26 | Disposition: A | Discharge status: Home / Self Care | Payer: Medicaid Other | Source: Ambulatory Visit | Attending: Gastroenterology | Admitting: Gastroenterology

## 2017-08-26 DIAGNOSIS — K5090 Crohn's disease, unspecified, without complications: Secondary | ICD-10-CM

## 2017-08-26 MED ORDER — ONDANSETRON 4 MG PO TBDP
4.0000 mg | ORAL_TABLET | Freq: Once | ORAL | Status: AC
  Administered 2017-08-26: 4 mg via ORAL
  Filled 2017-08-26: qty 1

## 2017-08-26 MED ORDER — SODIUM CHLORIDE 0.9 % IV SOLN
Freq: Once | INTRAVENOUS | Status: AC
  Administered 2017-08-26: 13:00:00 via INTRAVENOUS

## 2017-08-26 MED ORDER — ACETAMINOPHEN 325 MG PO TABS
650.0000 mg | ORAL_TABLET | Freq: Once | ORAL | Status: AC
  Administered 2017-08-26: 650 mg via ORAL
  Filled 2017-08-26: qty 2

## 2017-08-26 MED ORDER — SODIUM CHLORIDE 0.9 % IV SOLN
5.0000 mg/kg | INTRAVENOUS | Status: DC
  Administered 2017-08-26: 500 mg via INTRAVENOUS
  Filled 2017-08-26: qty 50

## 2017-08-26 MED ORDER — DIPHENHYDRAMINE HCL 25 MG PO CAPS
50.0000 mg | ORAL_CAPSULE | Freq: Once | ORAL | Status: AC
  Administered 2017-08-26: 50 mg via ORAL
  Filled 2017-08-26: qty 2

## 2017-08-27 ENCOUNTER — Ambulatory Visit (INDEPENDENT_AMBULATORY_CARE_PROVIDER_SITE_OTHER): Payer: Medicaid Other | Admitting: Psychiatry

## 2017-08-27 ENCOUNTER — Encounter (HOSPITAL_COMMUNITY): Payer: Self-pay | Admitting: Psychiatry

## 2017-08-27 VITALS — BP 126/76 | HR 82 | Resp 96 | Ht 64.0 in | Wt 212.0 lb

## 2017-08-27 DIAGNOSIS — F419 Anxiety disorder, unspecified: Secondary | ICD-10-CM | POA: Diagnosis not present

## 2017-08-27 DIAGNOSIS — F17210 Nicotine dependence, cigarettes, uncomplicated: Secondary | ICD-10-CM | POA: Diagnosis not present

## 2017-08-27 DIAGNOSIS — F331 Major depressive disorder, recurrent, moderate: Secondary | ICD-10-CM | POA: Diagnosis not present

## 2017-08-27 DIAGNOSIS — F4310 Post-traumatic stress disorder, unspecified: Secondary | ICD-10-CM

## 2017-08-27 DIAGNOSIS — R450 Nervousness: Secondary | ICD-10-CM

## 2017-08-27 MED ORDER — LORAZEPAM 0.5 MG PO TABS: 0.5000 mg | ORAL_TABLET | Freq: Every day | ORAL | 0 refills | Status: DC | PRN

## 2017-08-27 MED ORDER — DULOXETINE HCL 60 MG PO CPEP: 60.0000 mg | ORAL_CAPSULE | Freq: Every day | ORAL | 0 refills | Status: DC
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
## 2017-08-27 NOTE — Patient Instructions (Signed)
1. Continue duloxetine 60 mg daily  2. Continue lorazepam 0.5 mg daily as needed for sleep 3. Return to clinic in one month for 30 mins

## 2017-08-29 ENCOUNTER — Ambulatory Visit (HOSPITAL_COMMUNITY): Payer: Self-pay

## 2017-09-10 ENCOUNTER — Other Ambulatory Visit: Payer: Self-pay | Admitting: "Endocrinology

## 2017-09-20 ENCOUNTER — Encounter (HOSPITAL_COMMUNITY): Payer: Self-pay | Admitting: *Deleted

## 2017-09-20 DIAGNOSIS — I5033 Acute on chronic diastolic (congestive) heart failure: Secondary | ICD-10-CM | POA: Insufficient documentation

## 2017-09-20 DIAGNOSIS — E1140 Type 2 diabetes mellitus with diabetic neuropathy, unspecified: Secondary | ICD-10-CM | POA: Diagnosis not present

## 2017-09-20 DIAGNOSIS — Z79899 Other long term (current) drug therapy: Secondary | ICD-10-CM | POA: Insufficient documentation

## 2017-09-20 DIAGNOSIS — R600 Localized edema: Secondary | ICD-10-CM | POA: Diagnosis not present

## 2017-09-20 DIAGNOSIS — R109 Unspecified abdominal pain: Secondary | ICD-10-CM | POA: Diagnosis not present

## 2017-09-20 DIAGNOSIS — R112 Nausea with vomiting, unspecified: Secondary | ICD-10-CM | POA: Diagnosis not present

## 2017-09-20 DIAGNOSIS — R062 Wheezing: Secondary | ICD-10-CM | POA: Insufficient documentation

## 2017-09-20 DIAGNOSIS — R0600 Dyspnea, unspecified: Secondary | ICD-10-CM | POA: Diagnosis not present

## 2017-09-20 DIAGNOSIS — F17210 Nicotine dependence, cigarettes, uncomplicated: Secondary | ICD-10-CM | POA: Insufficient documentation

## 2017-09-20 DIAGNOSIS — J449 Chronic obstructive pulmonary disease, unspecified: Secondary | ICD-10-CM | POA: Diagnosis not present

## 2017-09-20 DIAGNOSIS — R140 Abdominal distension (gaseous): Secondary | ICD-10-CM | POA: Diagnosis not present

## 2017-09-20 DIAGNOSIS — Z794 Long term (current) use of insulin: Secondary | ICD-10-CM | POA: Insufficient documentation

## 2017-09-20 DIAGNOSIS — R197 Diarrhea, unspecified: Secondary | ICD-10-CM | POA: Insufficient documentation

## 2017-09-20 DIAGNOSIS — Z950 Presence of cardiac pacemaker: Secondary | ICD-10-CM | POA: Diagnosis not present

## 2017-09-20 DIAGNOSIS — R0602 Shortness of breath: Secondary | ICD-10-CM | POA: Diagnosis present

## 2017-09-20 NOTE — ED Triage Notes (Signed)
Patient reports that she had onset of abdominal swelling and shortness of breath. Now, has swelling all over her body. Also states that she has had rectal bleeding about a week, hx of Crohn's.

## 2017-09-21 ENCOUNTER — Emergency Department (HOSPITAL_COMMUNITY)
Admission: EM | Admit: 2017-09-21 | Discharge: 2017-09-21 | Disposition: A | Discharge status: Home / Self Care | Payer: Medicaid Other | Attending: Emergency Medicine | Admitting: Emergency Medicine

## 2017-09-21 ENCOUNTER — Emergency Department (HOSPITAL_COMMUNITY): Payer: Medicaid Other

## 2017-09-21 DIAGNOSIS — I5033 Acute on chronic diastolic (congestive) heart failure: Secondary | ICD-10-CM

## 2017-09-21 DIAGNOSIS — R0600 Dyspnea, unspecified: Secondary | ICD-10-CM

## 2017-09-21 LAB — D-DIMER, QUANTITATIVE: D-Dimer, Quant: 0.72 ug/mL-FEU — ABNORMAL HIGH (ref 0.00–0.50)

## 2017-09-21 LAB — COMPREHENSIVE METABOLIC PANEL
ALBUMIN: 3.2 g/dL — AB (ref 3.5–5.0)
ALT: 29 U/L (ref 14–54)
ANION GAP: 13 (ref 5–15)
AST: 36 U/L (ref 15–41)
Alkaline Phosphatase: 131 U/L — ABNORMAL HIGH (ref 38–126)
BUN: 15 mg/dL (ref 6–20)
CALCIUM: 7.4 mg/dL — AB (ref 8.9–10.3)
CO2: 30 mmol/L (ref 22–32)
Chloride: 102 mmol/L (ref 101–111)
Creatinine, Ser: 1.03 mg/dL — ABNORMAL HIGH (ref 0.44–1.00)
GFR calc non Af Amer: >60 mL/min (ref >60)
Glucose, Bld: 265 mg/dL — ABNORMAL HIGH (ref 65–99)
POTASSIUM: 3.4 mmol/L — AB (ref 3.5–5.1)
SODIUM: 145 mmol/L (ref 135–145)
Total Bilirubin: 0.3 mg/dL (ref 0.3–1.2)
Total Protein: 5.7 g/dL — ABNORMAL LOW (ref 6.5–8.1)

## 2017-09-21 LAB — CBC WITH DIFFERENTIAL/PLATELET
BASOS PCT: 0 %
Basophils Absolute: 0 10*3/uL (ref 0.0–0.1)
EOS ABS: 0.2 10*3/uL (ref 0.0–0.7)
EOS PCT: 2 %
HCT: 38.5 % (ref 36.0–46.0)
Hemoglobin: 12.4 g/dL (ref 12.0–15.0)
LYMPHS ABS: 4.5 10*3/uL — AB (ref 0.7–4.0)
Lymphocytes Relative: 45 %
MCH: 30.4 pg (ref 26.0–34.0)
MCHC: 32.2 g/dL (ref 30.0–36.0)
MCV: 94.4 fL (ref 78.0–100.0)
MONOS PCT: 6 %
Monocytes Absolute: 0.6 10*3/uL (ref 0.1–1.0)
Neutro Abs: 4.7 10*3/uL (ref 1.7–7.7)
Neutrophils Relative %: 47 %
PLATELETS: 88 10*3/uL — AB (ref 150–400)
RBC: 4.08 MIL/uL (ref 3.87–5.11)
RDW: 13 % (ref 11.5–15.5)
WBC: 10.1 10*3/uL (ref 4.0–10.5)

## 2017-09-21 LAB — TROPONIN I
Troponin I: <0.03 ng/mL (ref <0.03)
Troponin I: <0.03 ng/mL (ref <0.03)

## 2017-09-21 LAB — AMMONIA: AMMONIA: 40 umol/L — AB (ref 9–35)

## 2017-09-21 LAB — LIPASE, BLOOD: Lipase: 46 U/L (ref 11–51)

## 2017-09-21 LAB — BRAIN NATRIURETIC PEPTIDE: B NATRIURETIC PEPTIDE 5: 227 pg/mL — AB (ref 0.0–100.0)

## 2017-09-21 MED ORDER — FUROSEMIDE 10 MG/ML IJ SOLN
40.0000 mg | Freq: Once | INTRAMUSCULAR | Status: AC
  Administered 2017-09-21: 40 mg via INTRAVENOUS
  Filled 2017-09-21: qty 4

## 2017-09-21 MED ORDER — IOPAMIDOL (ISOVUE-370) INJECTION 76%
100.0000 mL | Freq: Once | INTRAVENOUS | Status: AC | PRN
  Administered 2017-09-21: 100 mL via INTRAVENOUS

## 2017-09-21 NOTE — ED Notes (Signed)
Pulse Oximetry while ambulating Pt complete. Pt maintained Pulse Ox =/> 94%. MD Notified.

## 2017-09-21 NOTE — ED Provider Notes (Signed)
[facility] EMERGENCY DEPARTMENT Provider Note   CSN: [id number] Arrival date & time: 09/20/17  2306     History   Chief Complaint Chief Complaint  Patient presents with  . Shortness of Breath    HPI [name] is a 51 y.o. female.  Patient reports shortness of breath times 2 days.  This is worse when she tries to lay flat and worse when she exerts herself.  No chest pain.  No cough or fever.  She reports history of COPD and sleep apnea though states that her sleep apnea testing was negative.  Denies any history of heart failure.  Last week she had multiple episodes of vomiting and bloody diarrhea which she attributed to her Crohn's.  This has since resolved.  She denies any abdominal pain or fever.  She complains of neuropathy in her legs and increased leg swelling over the past several days.  She has a pacemaker in place.  Denies any chest pain or fever.   The history is provided by the patient.    Past Medical History:  Diagnosis Date  . Anxiety   . Arthritis   . Cardiac pacemaker in situ 2009   DDD [employer] -- [place] 60  . Complication of anesthesia   . COPD with asthma (HCC)    GOLD 2-3 --  pulmologist (last visit 2011) Dr. Marchelle Gearing  . Crohn's disease (HCC)    Large intestine  . Depression 2016   PTSD  . Essential hypertension   . Family history of adverse reaction to anesthesia    father- stop breathing surgery   . Gastroparesis   . GERD (gastroesophageal reflux disease)   . History of hiatal hernia   . History of kidney stones   . History of stroke    Jun 2011 -- right hand weakness  . History of syncope   . Inappropriate sinus tachycardia    Sinus node modification 02-25-2003 by Dr. Lewayne Bunting  . LLQ abdominal tenderness 10/20/2015  . Neuromuscular disorder (HCC)    neuropathy   . OSA (obstructive sleep apnea)    Study done 2005 -- pt refused CPAP/previously was using nocturnal oxygen until one year ago pt states PCP is monitoring pt  without  . Pelvic pain in female   . PONV (postoperative nausea and vomiting)   . Presence of permanent cardiac pacemaker   . PTSD (post-traumatic stress disorder)   . Sinus node dysfunction (HCC)    Symptomatic bradycardia  . Stroke [facility]) 2014   left leg weakness- uses leg brace   . Type 2 diabetes mellitus (HCC)   . Wears glasses     Patient Active Problem List   Diagnosis Date Noted  . Crohn's disease without complication (HCC)   . Vitamin D deficiency 11/19/2016  . Moderate episode of recurrent major depressive disorder (HCC) 11/01/2016  . Chronic diarrhea   . Dysphagia   . PTSD (post-traumatic stress disorder) 08/29/2016  . Acute bronchitis 12/27/2015  . Upper airway cough syndrome 10/24/2015  . Morbid obesity (HCC) 10/24/2015  . Essential hypertension 10/24/2015  . LLQ abdominal tenderness 10/20/2015  . Diabetic neuropathy (HCC) 08/02/2015  . Depression 07/04/2015  . COPD GOLD 0 / still smoking  07/04/2015  . OSA (obstructive sleep apnea) 07/04/2015  . Other specified cardiac dysrhythmias(427.89)   . DM type 2 causing vascular disease (HCC) 08/22/2010  . MIXED HYPERLIPIDEMIA 11/20/2009  . Current smoker 11/03/2009  . IBS 10/23/2009  . CARDIAC PACEMAKER IN SITU 08/29/2009  .  ESOPHAGEAL STRICTURE 06/28/2009  . ESOPHAGEAL REFLUX 03/16/2009  . GASTROPARESIS 03/16/2009    Past Surgical History:  Procedure Laterality Date  . 24 HOUR PH STUDY N/A 12/30/2016   Procedure: 24 HOUR PH STUDY;  Surgeon: Ruffin Frederick, MD;  Location: [facility];  Service: Gastroenterology;  Laterality: N/A;  . ABDOMINAL HYSTERECTOMY    . BILATERAL KNEE ARTHROSCOPY W/ CHONDROMALACIA PATELLA  bilateral ---- 12-26-2010; 08-14-2009;  04-25-2008;  03-09-2007   additional same surgery, Left knee 2005;  x2 2006 ---  Right knee 2005;  2006;  x2  2007  . CARDIAC CATHETERIZATION  05-08-2001  dr Nicki Guadalajara   normal coronary arteries and LVF  . CARDIAC ELECTROPHYSIOLOGY STUDY W/  SINUS NODE  MODIFICATION  02-25-2003  dr Sharlot Gowda taylor  . CARDIAC PACEMAKER PLACEMENT  10-16-2007  dr Lewayne Bunting   DDD-- [facility] 60  . CARDIOVASCULAR STRESS TEST  08-02-2010  dr Diona Browner   normal lexiscan study/  ef 59%  . CESAREAN SECTION  x2  . CHOLECYSTECTOMY  1994  . COLONOSCOPY N/A 05/23/2017   Procedure: COLONOSCOPY;  Surgeon: Benancio Deeds, MD;  Location: [facility];  Service: Gastroenterology;  Laterality: N/A;  . COLONOSCOPY WITH PROPOFOL N/A 09/27/2016   Procedure: COLONOSCOPY WITH PROPOFOL;  Surgeon: Ruffin Frederick, MD;  Location: [facility];  Service: Gastroenterology;  Laterality: N/A;  . CYSTO/  URETEROSCOPIC STONE EXTRACTION  03/ 2005  . CYSTOSCOPY WITH HYDRODISTENSION AND BIOPSY N/A 05/23/2015   Procedure: CYSTOSCOPY/BIOPSY/HYDRODISTENSION;  Surgeon: Su Grand, MD;  Location: [facility];  Service: Urology;  Laterality: N/A;  . ESOPHAGEAL MANOMETRY N/A 12/30/2016   Procedure: ESOPHAGEAL MANOMETRY (EM);  Surgeon: Ruffin Frederick, MD;  Location: [facility];  Service: Gastroenterology;  Laterality: N/A;  . ESOPHAGOGASTRODUODENOSCOPY (EGD) WITH PROPOFOL N/A 09/27/2016   Procedure: ESOPHAGOGASTRODUODENOSCOPY (EGD) WITH PROPOFOL;  Surgeon: Ruffin Frederick, MD;  Location: [facility];  Service: Gastroenterology;  Laterality: N/A;  . ESOPHAGOGASTRODUODENOSCOPY (EGD) WITH PROPOFOL N/A 05/23/2017   Procedure: ESOPHAGOGASTRODUODENOSCOPY (EGD) WITH PROPOFOL;  Surgeon: Benancio Deeds, MD;  Location: [facility];  Service: Gastroenterology;  Laterality: N/A;  . MULTIPLE EXTRACTIONS WITH ALVEOLOPLASTY    . SAVORY DILATION N/A 05/23/2017   Procedure: SAVORY DILATION;  Surgeon: Benancio Deeds, MD;  Location: [facility];  Service: Gastroenterology;  Laterality: N/A;  . TONSILLECTOMY  11-25-2002    OB History    Gravida Para Term Preterm AB Living   5 2     3 2    SAB TAB Ectopic Multiple Live Births   3               Home  Medications    Prior to Admission medications   Medication Sig Start Date End Date Taking? Authorizing Provider  albuterol (PROVENTIL) (2.5 MG/3ML) 0.083% nebulizer solution Take 3 mLs (2.5 mg total) by nebulization every 6 (six) hours as needed for wheezing or shortness of breath. 11/09/15   Dettinger, Elige Radon, MD  aspirin EC 81 MG tablet Take 81 mg by mouth daily.    [provider]  BD PEN NEEDLE NANO U/F 32G X 4 MM MISC 1 each by Does not apply route 4 (four) times daily. 06/18/16   Jacquelin Hawking, PA-C  carvedilol (COREG) 6.25 MG tablet Take 1 tablet (6.25 mg total) by mouth 2 (two) times daily. 07/08/17   Jonelle Sidle, MD  Cholecalciferol (VITAMIN D3) 5000 units CAPS Take 1 capsule (5,000 Units total) by mouth daily. 11/19/16   Purcell Nails  W, MD  clopidogrel (PLAVIX) 75 MG tablet Take 1 tablet (75 mg total) by mouth daily. 02/27/17 09/25/17  Marinus Maw, MD  colestipol (COLESTID) 1 g tablet Take 1 tablet (1 g total) by mouth 2 (two) times daily. 04/18/17   Armbruster, Willaim Rayas, MD  diclofenac sodium (VOLTAREN) 1 % GEL Apply 4 g topically 4 (four) times daily. 11/09/15   Dettinger, Elige Radon, MD  dicyclomine (BENTYL) 20 MG tablet Take 1 tablet (20 mg total) by mouth every 8 (eight) hours as needed for spasms. 04/17/17   Armbruster, Willaim Rayas, MD  diphenoxylate-atropine (LOMOTIL) 2.5-0.025 MG tablet Take 2 tabs by mouth x 1, then 1 tab every 4 hrs PRN, max of 8 tabs in 24 hour time 07/14/17   Armbruster, Willaim Rayas, MD  DULoxetine (CYMBALTA) 60 MG capsule Take 1 capsule (60 mg total) by mouth daily. 08/27/17   Neysa Hotter, MD  furosemide (LASIX) 40 MG tablet TAKE (1) TABLET BY MOUTH EACH MORNING. 07/08/17   Jonelle Sidle, MD  gemfibrozil (LOPID) 600 MG tablet TAKE 1 TABLET TWICE DAILY BEFORE MEALS. 03/31/17   Nida, Denman George, MD  glucose blood (ACCU-CHEK GUIDE) test strip Use as instructed 4 x daily. E11.65 08/06/16   Roma Kayser, MD  ibuprofen (ADVIL,MOTRIN)  200 MG tablet Take 200 mg by mouth every 6 (six) hours as needed for moderate pain.    [provider]  inFLIXimab 500 mg in sodium chloride 0.9 % 200 mL Inject 500 mg into the vein every 8 (eight) weeks. 03/11/17   Armbruster, Willaim Rayas, MD  insulin aspart (NOVOLOG FLEXPEN) 100 UNIT/ML FlexPen Inject 15-21 Units into the skin 3 (three) times daily with meals. 08/12/16   Roma Kayser, MD  INVOKANA 100 MG TABS tablet TAKE 1 TABLET BY MOUTH DAILY BEFORE BREAKFAST. 03/31/17   Roma Kayser, MD  LEVEMIR FLEXTOUCH 100 UNIT/ML Pen INJECT 80 UNITS UNDER THE SKIN EVERY DAY AT 10 PM 11/20/16   Roma Kayser, MD  LORazepam (ATIVAN) 0.5 MG tablet Take 1 tablet (0.5 mg total) by mouth daily as needed for anxiety. 08/27/17   Neysa Hotter, MD  losartan (COZAAR) 25 MG tablet Take 1 tablet (25 mg total) by mouth daily. 08/05/16   Roma Kayser, MD  LYRICA 150 MG capsule take 150mg  by mouth three times daily 10/23/16   [provider]  metFORMIN (GLUCOPHAGE) 1000 MG tablet TAKE 1 TABLET(1000 MG) BY MOUTH TWICE DAILY WITH A MEAL 08/05/16   Jacquelin Hawking, PA-C  nitroGLYCERIN (NITROSTAT) 0.4 MG SL tablet Place 1 tablet (0.4 mg total) under the tongue every 5 (five) minutes as needed for chest pain. 04/22/16   Devoria Albe, MD  OXYGEN Inhale 3 L/day into the lungs at bedtime.    [provider]  pantoprazole (PROTONIX) 40 MG tablet Take 1 tablet (40 mg total) by mouth 2 (two) times daily. 04/17/17   Armbruster, Willaim Rayas, MD  potassium chloride (K-DUR) 10 MEQ tablet Take 1 tablet (10 mEq total) by mouth daily. 07/08/17 10/06/17  Jonelle Sidle, MD  pramipexole (MIRAPEX) 0.25 MG tablet Take 0.5 mg by mouth 3 (three) times daily.    [provider]  simvastatin (ZOCOR) 10 MG tablet TAKE (1) TABLET BY MOUTH DAILY. 05/13/17   Jonelle Sidle, MD  SYMBICORT 160-4.5 MCG/ACT inhaler INHALE 2 PUFFS INTO THE LUNGS EVERY 12 HOURS 08/05/16   Jacquelin Hawking, PA-C    tapentadol [facility]) 100 MG 12 hr tablet Take 100  mg by mouth every 12 (twelve) hours.    [provider]  tiZANidine (ZANAFLEX) 4 MG capsule Take 4 mg by mouth 3 (three) times daily.    [provider]    Family History Family History  Problem Relation Age of Onset  . Diabetes Mother   . Asthma Mother   . Heart disease Mother   . Cirrhosis Mother   . Stroke Mother   . Heart disease Father        Deceased. MI. Mother, 2 brothers, sister, nephew also have heart disease  . Emphysema Father        Died of it. Was pt of Dr. Sherene Sires   . Heart attack Father   . Cancer Father        ? type  . Diabetes Brother   . Heart disease Brother   . Diabetes Sister   . Cirrhosis Sister   . Cirrhosis Maternal Grandmother   . Diabetes Brother   . Heart attack Brother   . Heart disease Brother   . Seizures Brother   . Colon cancer Neg Hx   . Stomach cancer Neg Hx   . Esophageal cancer Neg Hx   . Rectal cancer Neg Hx   . Liver cancer Neg Hx     Social History Social History   Tobacco Use  . Smoking status: Current Every Day Smoker    Packs/day: 2.00    Years: 42.00    Pack years: 84.00    Types: Cigarettes    Start date: 02/02/1973  . Smokeless tobacco: Never Used  . Tobacco comment: smoke about 2 packs because I recently lost my mom.  Substance Use Topics  . Alcohol use: No    Alcohol/week: 0.0 oz  . Drug use: No    Comment: 08-29-2016 per pt no     Allergies   Flexeril [cyclobenzaprine] and Amoxicillin   Review of Systems Review of Systems  Constitutional: Positive for activity change and appetite change. Negative for fever.  HENT: Negative for congestion and rhinorrhea.   Eyes: Negative for visual disturbance.  Respiratory: Positive for cough and shortness of breath. Negative for chest tightness.   Cardiovascular: Positive for leg swelling.  Gastrointestinal: Positive for abdominal pain, blood in stool, diarrhea, nausea and vomiting.  Genitourinary:  Negative for vaginal bleeding and vaginal discharge.  Musculoskeletal: Negative for arthralgias.  Skin: Negative for rash.  Neurological: Positive for weakness. Negative for dizziness and headaches.    all other systems are negative except as noted in the HPI and PMH.   Physical Exam Updated Vital Signs BP 128/81 (BP Location: Right Arm)   Pulse (!) 101   Temp 98.9 F (37.2 C) (Oral)   Resp (!) 32   Ht 5\' 4"  (1.626 m)   Wt 89.4 kg (197 lb)   SpO2 96%   BMI 33.81 kg/m   Physical Exam  Constitutional: She is oriented to person, place, and time. She appears well-developed and well-nourished. No distress.  HENT:  Head: Normocephalic and atraumatic.  Mouth/Throat: Oropharynx is clear and moist. No oropharyngeal exudate.  Eyes: Conjunctivae and EOM are normal. Pupils are equal, round, and reactive to light.  Neck: Normal range of motion. Neck supple.  No meningismus.  Cardiovascular: Normal rate, regular rhythm, normal heart sounds and intact distal pulses.  No murmur heard. Pulmonary/Chest: Effort normal. No respiratory distress. She has wheezes. She exhibits no tenderness.  Pacemaker in place  Abdominal: Soft. She exhibits distension. There is tenderness. There is  no rebound and no guarding.  Musculoskeletal: Normal range of motion. She exhibits edema. She exhibits no tenderness.  +1 edema to knees bilaterally  Neurological: She is alert and oriented to person, place, and time. No cranial nerve deficit. She exhibits normal muscle tone. Coordination normal.  No ataxia on finger to nose bilaterally. No pronator drift. 5/5 strength throughout. CN 2-12 intact.Equal grip strength. Sensation intact.   Skin: Skin is warm. Capillary refill takes less than 2 seconds. No rash noted.  Psychiatric: She has a normal mood and affect. Her behavior is normal.  Nursing note and vitals reviewed.    ED Treatments / Results  Labs (all labs ordered are listed, but only abnormal results are  displayed) Labs Reviewed  CBC WITH DIFFERENTIAL/PLATELET - Abnormal; Notable for the following components:      Result Value   Platelets 88 (*)    Lymphs Abs 4.5 (*)    All other components within normal limits  COMPREHENSIVE METABOLIC PANEL - Abnormal; Notable for the following components:   Potassium 3.4 (*)    Glucose, Bld 265 (*)    Creatinine, Ser 1.03 (*)    Calcium 7.4 (*)    Total Protein 5.7 (*)    Albumin 3.2 (*)    Alkaline Phosphatase 131 (*)    All other components within normal limits  BRAIN NATRIURETIC PEPTIDE - Abnormal; Notable for the following components:   B Natriuretic Peptide 227.0 (*)    All other components within normal limits  AMMONIA - Abnormal; Notable for the following components:   Ammonia 40 (*)    All other components within normal limits  D-DIMER, QUANTITATIVE (NOT AT [facility]) - Abnormal; Notable for the following components:   D-Dimer, Quant 0.72 (*)    All other components within normal limits  LIPASE, BLOOD  TROPONIN I  TROPONIN I    EKG  EKG Interpretation  Date/Time:  Saturday September 20 2017 23:27:10 EST Ventricular Rate:  69 PR Interval:  128 QRS Duration: 70 QT Interval:  438 QTC Calculation: 469 R Axis:   3 Text Interpretation:  Normal sinus rhythm with sinus arrhythmia Anteroseptal infarct , age undetermined Abnormal ECG No significant change was found Confirmed by Glynn Octaveancour, Eurydice Calixto [phone]) on 09/20/2017 11:31:21 PM       Radiology Dg Chest 2 View  Result Date: 09/21/2017 CLINICAL DATA:  51 y/o  F; shortness of breath. EXAM: CHEST  2 VIEW COMPARISON:  02/06/2017 chest radiograph FINDINGS: Stable normal cardiac silhouette. Two lead pacemaker. Right upper quadrant cholecystectomy clips. Aortic atherosclerosis with calcification. Clear lungs. No pleural effusion or pneumothorax. No acute osseous abnormality is evident. IMPRESSION: No acute pulmonary process identified. Electronically Signed   By: Mitzi HansenLance  Furusawa-Stratton M.D.   On:  09/21/2017 01:53   Ct Abdomen Pelvis W Contrast  Result Date: 09/21/2017 CLINICAL DATA:  Acute onset of shortness of breath, abdominal swelling and bilateral lower extremity edema. EXAM: CT ANGIOGRAPHY CHEST CT ABDOMEN AND PELVIS WITH CONTRAST TECHNIQUE: Multidetector CT imaging of the chest was performed using the standard protocol during bolus administration of intravenous contrast. Multiplanar CT image reconstructions and MIPs were obtained to evaluate the vascular anatomy. Multidetector CT imaging of the abdomen and pelvis was performed using the standard protocol during bolus administration of intravenous contrast. CONTRAST:  100mL ISOVUE-370 IOPAMIDOL (ISOVUE-370) INJECTION 76% COMPARISON:  CT of the abdomen and pelvis performed 03/30/2015, chest radiograph performed earlier today at 1:14 a.m. FINDINGS: CTA CHEST FINDINGS Cardiovascular:  There is no evidence of pulmonary embolus.  The heart is normal in size. The thoracic aorta is grossly unremarkable. The great vessels are within normal limits. Pacemaker leads are noted, with a left-sided chest wall pacemaker. Mediastinum/Nodes: The mediastinum is unremarkable in appearance. No mediastinal lymphadenopathy is seen. No pericardial effusion is identified. The visualized portions of the thyroid gland are unremarkable. No axillary lymphadenopathy is seen. Lungs/Pleura: The lungs are essentially clear bilaterally. Minimal haziness at the right lung base is thought to reflect motion artifact. No focal consolidation, pleural effusion or pneumothorax is seen. No masses are identified. Musculoskeletal: No acute osseous abnormalities are identified. The visualized musculature is unremarkable in appearance. Review of the MIP images confirms the above findings. CT ABDOMEN and PELVIS FINDINGS Hepatobiliary: The liver is unremarkable in appearance. The patient is status post cholecystectomy, with clips noted at the gallbladder fossa. The common bile duct remains normal  in caliber. Pancreas: The pancreas is within normal limits. Spleen: The spleen is unremarkable in appearance. Adrenals/Urinary Tract: The adrenal glands are unremarkable in appearance. The kidneys are within normal limits. There is no evidence of hydronephrosis. No renal or ureteral stones are identified. Mild nonspecific perinephric stranding is noted bilaterally. Stomach/Bowel: The stomach is unremarkable in appearance. The small bowel is within normal limits. The appendix is normal in caliber, without evidence of appendicitis. The colon is partially filled with stool and unremarkable in appearance. Vascular/Lymphatic: Scattered calcification is seen along the abdominal aorta and its branches. The abdominal aorta is otherwise grossly unremarkable. The inferior vena cava is grossly unremarkable. No retroperitoneal lymphadenopathy is seen. No pelvic sidewall lymphadenopathy is identified. Reproductive: The bladder is significantly distended and grossly unremarkable. The patient is status post hysterectomy. The ovaries are grossly symmetric. No suspicious adnexal masses are seen. Other: No additional soft tissue abnormalities are seen. Musculoskeletal: No acute osseous abnormalities are identified. The visualized musculature is unremarkable in appearance. Review of the MIP images confirms the above findings. IMPRESSION: 1. No evidence of pulmonary embolus. 2. Lungs clear bilaterally. 3. No acute abnormality seen within the abdomen or pelvis. Aortic Atherosclerosis (ICD10-I70.0). Electronically Signed   By: Roanna Raider M.D.   On: 09/21/2017 03:26    Procedures Procedures (including critical care time)  Medications Ordered in ED Medications - No data to display   Initial Impression / Assessment and Plan / ED Course  I have reviewed the triage vital signs and the nursing notes.  Pertinent labs & imaging results that were available during my care of the patient were reviewed by me and considered in my  medical decision making (see chart for details).    Patient with 1 day history of difficulty breathing with abdominal swelling and leg swelling.  Has chronic neuropathy in her legs which is unchanged.  Denies chest pain, cough or fever.  Feels like her abdomen is more swollen.  Patient is in no distress.  She does have some mild dyspnea with talking.  She has clear lungs without wheezing.  EKG is nonischemic and unchanged.  Chest x-ray is negative.  Echocardiogram reviewed from March 2018 which shows normal EF.  Patient does take Lasix at home for leg swelling. IV Lasix given.  There is no evidence of pulmonary embolism, pneumonia.  Patient had might have mild exacerbation of diastolic heart failure.  IV Lasix given.  She is ambulatory without hypoxia.  Lungs are clear on recheck.  No wheezing or rhonchi.  Abdominal CT scan is reassuring.  Troponin negative x2. Pacemaker interrogated without Any ventricular arrhythmias.  Instructed patient to increase  her Lasix for 2 days and then return to her normal dose.  Follow-up with her cardiologist.  Low suspicion for ACS or pulmonary embolism.  Patient ambulatory without desaturation.  She has cardiology follow-up this week.  Return precautions discussed including chest pain, shortness of breath or other concerns. Final Clinical Impressions(s) / ED Diagnoses   Final diagnoses:  Dyspnea, unspecified type  Acute on chronic diastolic congestive heart failure [facility])    ED Discharge Orders    None       Glynn Octave, MD 09/21/17 [phone]

## 2017-09-21 NOTE — ED Notes (Signed)
Patient transported to X-ray 

## 2017-09-21 NOTE — Discharge Instructions (Signed)
There is no evidence of heart attack or blood clot in the lung.  Increase her Lasix to twice daily for the next 2 days then go back to once daily.  Follow-up with Dr. Diona BrownerMcDowell as scheduled.  Return to the ED if you develop worsening shortness of breath or chest pain or any other concerns.

## 2017-09-21 NOTE — ED Notes (Signed)
Pt presents with SOB, abdominal swelling/bloating. Pt states she has neuropathy and has no feeling in her lower extremities making it difficult for her to walk. Pt has mild edema in bilateral lower extremities and weak motor response.

## 2017-09-22 ENCOUNTER — Other Ambulatory Visit: Payer: Self-pay | Admitting: "Endocrinology
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
## 2017-09-26 ENCOUNTER — Ambulatory Visit (HOSPITAL_COMMUNITY): Payer: Medicaid Other | Admitting: Psychiatry

## 2017-09-30 ENCOUNTER — Other Ambulatory Visit: Payer: Self-pay

## 2017-09-30 MED ORDER — PANTOPRAZOLE SODIUM 40 MG PO TBEC: 40.0000 mg | DELAYED_RELEASE_TABLET | Freq: Two times a day (BID) | ORAL | 0 refills | Status: AC

## 2017-09-30 NOTE — Progress Notes (Signed)
Refill Pantoprazole °

## 2017-10-06 ENCOUNTER — Telehealth: Payer: Self-pay

## 2017-10-06 ENCOUNTER — Other Ambulatory Visit: Payer: Self-pay

## 2017-10-06 DIAGNOSIS — K5090 Crohn's disease, unspecified, without complications: Secondary | ICD-10-CM

## 2017-10-06 NOTE — Telephone Encounter (Signed)
Faxed orders for Remicade to [facility], orders in [place].

## 2017-10-06 NOTE — Telephone Encounter (Signed)
-----   Message from Leverne Humbles, RN sent at 08/13/2017  1:42 PM EST ----- Next remicade infusion on 10/15/17 at [facility], order zofran 4 mg too.

## 2017-10-07 NOTE — Progress Notes (Signed)
Cardiology Office Note  Date: 10/09/2017   ID: [name], DOB 1966-03-15, MRN [id number]  PCP: [facility]  Primary Cardiologist: Nona Dell, MD   Chief Complaint  Patient presents with  . Cardiac follow-up    History of Present Illness: [name] is a 52 y.o. female last seen in October 2018. She presents today for a follow-up visit. She was seen in the ER in December 2018 with shortness of breath and reported leg swelling. No clear etiology was determined at that time, she was given IV Lasix for possibility of mild fluid overload. Cardiac enzymes were negative and pacemaker interrogation was reportedly unrevealing as well. CT imaging of the chest and abdomen showed no acute abnormalities, specifically clear lung fields and no pulmonary embolus.  Patient presents today under the impression that she was diagnosed with congestive heart failure following this ER visit. She also states that she had follow-up lab work showing [facility] and [facility]. These studies were normal during the ER visit however. Today we went over the findings of her ER evaluation which really did not point toward definitive heart failure. Her last echocardiogram was in March 2018 as detailed below showing both normal LV systolic and diastolic function.  At the last visit Coreg was added back to her medical regimen. She also continues on Lasix with potassium supplements.  Past Medical History:  Diagnosis Date  . Anxiety   . Arthritis   . Cardiac pacemaker in situ 2009   DDD [employer] -- [place] 60  . Complication of anesthesia   . COPD with asthma (HCC)    GOLD 2-3 --  pulmologist (last visit 2011) Dr. Marchelle Gearing  . Crohn's disease (HCC)    Large intestine  . Depression 2016   PTSD  . Essential hypertension   . Family history of adverse reaction to anesthesia    father- stop breathing surgery   . Gastroparesis   .  GERD (gastroesophageal reflux disease)   . History of hiatal hernia   . History of kidney stones   . History of stroke    Jun 2011 -- right hand weakness  . History of syncope   . Inappropriate sinus tachycardia    Sinus node modification 02-25-2003 by Dr. Lewayne Bunting  . LLQ abdominal tenderness 10/20/2015  . Neuromuscular disorder (HCC)    neuropathy   . OSA (obstructive sleep apnea)    Study done 2005 -- pt refused CPAP/previously was using nocturnal oxygen until one year ago pt states PCP is monitoring pt without  . Pelvic pain in female   . PONV (postoperative nausea and vomiting)   . Presence of permanent cardiac pacemaker   . PTSD (post-traumatic stress disorder)   . Sinus node dysfunction (HCC)    Symptomatic bradycardia  . Stroke [facility]) 2014   left leg weakness- uses leg brace   . Type 2 diabetes mellitus (HCC)   . Wears glasses     Past Surgical History:  Procedure Laterality Date  . 24 HOUR PH STUDY N/A 12/30/2016   Procedure: 24 HOUR PH STUDY;  Surgeon: Ruffin Frederick, MD;  Location: [facility];  Service: Gastroenterology;  Laterality: N/A;  . ABDOMINAL HYSTERECTOMY    . BILATERAL KNEE ARTHROSCOPY W/ CHONDROMALACIA PATELLA  bilateral ---- 12-26-2010; 08-14-2009;  04-25-2008;  03-09-2007   additional same surgery, Left knee 2005;  x2 2006 ---  Right knee 2005;  2006;  x2  2007  .  CARDIAC CATHETERIZATION  05-08-2001  dr Nicki Guadalajara   normal coronary arteries and LVF  . CARDIAC ELECTROPHYSIOLOGY STUDY W/  SINUS NODE MODIFICATION  02-25-2003  dr Sharlot Gowda taylor  . CARDIAC PACEMAKER PLACEMENT  10-16-2007  dr Lewayne Bunting   DDD-- [facility] 60  . CARDIOVASCULAR STRESS TEST  08-02-2010  dr Diona Browner   normal lexiscan study/  ef 59%  . CESAREAN SECTION  x2  . CHOLECYSTECTOMY  1994  . COLONOSCOPY N/A 05/23/2017   Procedure: COLONOSCOPY;  Surgeon: Benancio Deeds, MD;  Location: [facility];  Service: Gastroenterology;  Laterality: N/A;  .  COLONOSCOPY WITH PROPOFOL N/A 09/27/2016   Procedure: COLONOSCOPY WITH PROPOFOL;  Surgeon: Ruffin Frederick, MD;  Location: [facility];  Service: Gastroenterology;  Laterality: N/A;  . CYSTO/  URETEROSCOPIC STONE EXTRACTION  03/ 2005  . CYSTOSCOPY WITH HYDRODISTENSION AND BIOPSY N/A 05/23/2015   Procedure: CYSTOSCOPY/BIOPSY/HYDRODISTENSION;  Surgeon: Su Grand, MD;  Location: [facility];  Service: Urology;  Laterality: N/A;  . ESOPHAGEAL MANOMETRY N/A 12/30/2016   Procedure: ESOPHAGEAL MANOMETRY (EM);  Surgeon: Ruffin Frederick, MD;  Location: [facility];  Service: Gastroenterology;  Laterality: N/A;  . ESOPHAGOGASTRODUODENOSCOPY (EGD) WITH PROPOFOL N/A 09/27/2016   Procedure: ESOPHAGOGASTRODUODENOSCOPY (EGD) WITH PROPOFOL;  Surgeon: Ruffin Frederick, MD;  Location: [facility];  Service: Gastroenterology;  Laterality: N/A;  . ESOPHAGOGASTRODUODENOSCOPY (EGD) WITH PROPOFOL N/A 05/23/2017   Procedure: ESOPHAGOGASTRODUODENOSCOPY (EGD) WITH PROPOFOL;  Surgeon: Benancio Deeds, MD;  Location: [facility];  Service: Gastroenterology;  Laterality: N/A;  . MULTIPLE EXTRACTIONS WITH ALVEOLOPLASTY    . SAVORY DILATION N/A 05/23/2017   Procedure: SAVORY DILATION;  Surgeon: Benancio Deeds, MD;  Location: [facility];  Service: Gastroenterology;  Laterality: N/A;  . TONSILLECTOMY  11-25-2002    Current Outpatient Medications  Medication Sig Dispense Refill  . albuterol (PROVENTIL) (2.5 MG/3ML) 0.083% nebulizer solution Take 3 mLs (2.5 mg total) by nebulization every 6 (six) hours as needed for wheezing or shortness of breath. 75 mL 12  . aspirin EC 81 MG tablet Take 81 mg by mouth daily.    . BD PEN NEEDLE NANO U/F 32G X 4 MM MISC 1 each by Does not apply route 4 (four) times daily. 120 each 0  . carvedilol (COREG) 6.25 MG tablet Take 1 tablet (6.25 mg total) by mouth 2 (two) times daily. 180 tablet 3  . Cholecalciferol (VITAMIN D3) 5000 units CAPS Take 1 capsule  (5,000 Units total) by mouth daily. 90 capsule 0  . colestipol (COLESTID) 1 g tablet Take 1 tablet (1 g total) by mouth 2 (two) times daily. 90 tablet 3  . diclofenac sodium (VOLTAREN) 1 % GEL Apply 4 g topically 4 (four) times daily. 200 g 11  . dicyclomine (BENTYL) 20 MG tablet Take 1 tablet (20 mg total) by mouth every 8 (eight) hours as needed for spasms. 90 tablet 3  . diphenoxylate-atropine (LOMOTIL) 2.5-0.025 MG tablet Take 2 tabs by mouth x 1, then 1 tab every 4 hrs PRN, max of 8 tabs in 24 hour time 240 tablet 1  . DULoxetine (CYMBALTA) 60 MG capsule Take 1 capsule (60 mg total) by mouth daily. 30 capsule 0  . furosemide (LASIX) 40 MG tablet TAKE (1) TABLET BY MOUTH EACH MORNING. 90 tablet 3  . gemfibrozil (LOPID) 600 MG tablet TAKE 1 TABLET TWICE DAILY BEFORE MEALS. 60 tablet 2  . glucose blood (ACCU-CHEK GUIDE) test strip Use as instructed 4 x daily. E11.65 150 each 5  .  ibuprofen (ADVIL,MOTRIN) 200 MG tablet Take 200 mg by mouth every 6 (six) hours as needed for moderate pain.    Marland Kitchen inFLIXimab 500 mg in sodium chloride 0.9 % 200 mL Inject 500 mg into the vein every 8 (eight) weeks. 1 Dose 6  . insulin aspart (NOVOLOG FLEXPEN) 100 UNIT/ML FlexPen Inject 15-21 Units into the skin 3 (three) times daily with meals. 5 pen 2  . INVOKANA 100 MG TABS tablet TAKE 1 TABLET BY MOUTH DAILY BEFORE BREAKFAST. 30 tablet 2  . LEVEMIR FLEXTOUCH 100 UNIT/ML Pen INJECT 80 UNITS UNDER THE SKIN EVERY DAY AT 10 PM 30 mL 2  . LORazepam (ATIVAN) 0.5 MG tablet Take 1 tablet (0.5 mg total) by mouth daily as needed for anxiety. 30 tablet 0  . losartan (COZAAR) 25 MG tablet Take 1 tablet (25 mg total) by mouth daily. 30 tablet 3  . LYRICA 150 MG capsule take 150mg  by mouth three times daily  0  . metFORMIN (GLUCOPHAGE) 1000 MG tablet TAKE 1 TABLET(1000 MG) BY MOUTH TWICE DAILY WITH A MEAL 60 tablet 3  . nitroGLYCERIN (NITROSTAT) 0.4 MG SL tablet Place 1 tablet (0.4 mg total) under the tongue every 5 (five) minutes  as needed for chest pain. 30 tablet 0  . OXYGEN Inhale 3 L/day into the lungs at bedtime.    . pantoprazole (PROTONIX) 40 MG tablet Take 1 tablet (40 mg total) by mouth 2 (two) times daily. 60 tablet 0  . potassium chloride (K-DUR) 10 MEQ tablet Take 1 tablet (10 mEq total) by mouth daily. 90 tablet 3  . pramipexole (MIRAPEX) 0.25 MG tablet Take 0.5 mg by mouth 3 (three) times daily.    . simvastatin (ZOCOR) 10 MG tablet TAKE (1) TABLET BY MOUTH DAILY. 90 tablet 2  . SYMBICORT 160-4.5 MCG/ACT inhaler INHALE 2 PUFFS INTO THE LUNGS EVERY 12 HOURS 10.2 g 0  . tapentadol (NUCYNTA ER) 100 MG 12 hr tablet Take 100 mg by mouth every 12 (twelve) hours.    Marland Kitchen tiZANidine (ZANAFLEX) 4 MG capsule Take 4 mg by mouth 3 (three) times daily.     No current facility-administered medications for this visit.    Allergies:  Flexeril [cyclobenzaprine] and Amoxicillin   Social History: The patient  reports that she has been smoking cigarettes.  She started smoking about 44 years ago. She has a 84.00 pack-year smoking history. she has never used smokeless tobacco. She reports that she does not drink alcohol or use drugs.   ROS:  Please see the history of present illness. Otherwise, complete review of systems is positive for anxiety.  All other systems are reviewed and negative.   Physical Exam: VS:  BP 118/62   Pulse 61   Ht 5\' 4"  (1.626 m)   Wt 212 lb (96.2 kg)   SpO2 96%   BMI 36.39 kg/m , BMI Body mass index is 36.39 kg/m.  Wt Readings from Last 3 Encounters:  10/09/17 212 lb (96.2 kg)  09/20/17 197 lb (89.4 kg)  08/26/17 210 lb (95.3 kg)    General: Patient appears comfortable at rest. HEENT: Conjunctiva and lids normal, oropharynx clear with poor dentition. Neck: Supple, no elevated JVP or carotid bruits, no thyromegaly. Lungs: Clear to auscultation, nonlabored breathing at rest. Cardiac: Regular rate and rhythm, no S3 or significant systolic murmur, no pericardial rub. Abdomen: Soft, nontender,  bowel sounds present. Extremities: No pitting edema, distal pulses 2+. Skin: Warm and dry. Tattoos. Musculoskeletal: No kyphosis. Neuropsychiatric: Alert and oriented  x3, affect grossly appropriate.  ECG: I personally reviewed the tracing from 09/20/2017 which showed sinus arrhythmia with decreased R wave progression and low voltage.  Recent Labwork: 11/05/2016: TSH 0.308 09/20/2017: ALT 29; AST 36; B Natriuretic Peptide 227.0; BUN 15; Creatinine, Ser 1.03; Hemoglobin 12.4; Platelets 88; Potassium 3.4; Sodium 145     Component Value Date/Time   CHOL 186 11/05/2016 1300   CHOL 276 (H) 07/07/2015 1052   TRIG 130 11/05/2016 1300   TRIG 422 (H) 01/30/2015 1329   HDL 87 11/05/2016 1300   HDL 42 07/07/2015 1052   HDL 46 01/30/2015 1329   CHOLHDL 2.1 11/05/2016 1300   VLDL 26 11/05/2016 1300   LDLCALC 73 11/05/2016 1300   LDLCALC 162 (H) 07/07/2015 1052    Other Studies Reviewed Today:  Echocardiogram 11/26/2016: Study Conclusions  - Left ventricle: The cavity size was normal. Wall thickness was   increased in a pattern of mild LVH. Systolic function was normal.   The estimated ejection fraction was in the range of 60% to 65%.   Wall motion was normal; there were no regional wall motion   abnormalities. Left ventricular diastolic function parameters   were normal. - Aortic valve: Valve area (VTI): 2.58 cm^2. Valve area (Vmax): 2.8   cm^2. - Right atrium: RA lead is very mobile, consider correlating   positioning by chest xray - Atrial septum: No defect or patent foramen ovale was identified. - Pulmonary arteries: Systolic pressure was mildly increased. PA   peak pressure: 32 mm Hg (S). - Technically adequate study.  Assessment and Plan:  1. Patient reported leg swelling and shortness of breath, intermittent. Legs are actually not swollen today on examination. I did review her recent ER workup and went over the findings with the patient. Plan is to obtain her recent lab work  through [employer]. Also repeat echocardiogram to ensure no change in LVEF compared to last March.  2. History of inappropriate sinus tachycardia status post sinus node modification and ultimately Environmental [employer] pacemaker by Dr. [name]. Device function has been normal.  3. Essential hypertension, blood pressure is normal today. She has tolerated Coreg.  Current medicines were reviewed with the patient today.   Orders Placed This Encounter  Procedures  . ECHOCARDIOGRAM COMPLETE    Disposition: Follow-up to be arranged pending echocardiogram review.  Signed, Jonelle Sidle, MD, [facility] 10/09/2017 12:47 PM    [facility] at [facility] [address] [place], [place], [place] [address] Phone: [phone]; Fax: [phone]

## 2017-10-08 ENCOUNTER — Telehealth (HOSPITAL_COMMUNITY): Payer: Self-pay | Admitting: *Deleted

## 2017-10-08 ENCOUNTER — Other Ambulatory Visit (HOSPITAL_COMMUNITY): Payer: Self-pay | Admitting: Psychiatry

## 2017-10-08 ENCOUNTER — Encounter (HOSPITAL_COMMUNITY): Payer: Self-pay | Admitting: Psychiatry

## 2017-10-08 MED ORDER — LORAZEPAM 0.5 MG PO TABS: 0.5000 mg | ORAL_TABLET | Freq: Every day | ORAL | 0 refills | Status: DC | PRN

## 2017-10-08 MED ORDER — DULOXETINE HCL 60 MG PO CPEP: 60.0000 mg | ORAL_CAPSULE | Freq: Every day | ORAL | 0 refills | Status: DC
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
## 2017-10-08 NOTE — Telephone Encounter (Signed)
Dr Vanetta Shawl Patient called in stating she missed 09/26/17 appointment due to not feeling well after cancer treatment. I did transfer to [facility] to reschedule another appointment. Patient is requesting a refill on her meds Cymbalta & Ativan.

## 2017-10-08 NOTE — Telephone Encounter (Signed)
done

## 2017-10-09 ENCOUNTER — Ambulatory Visit: Payer: Medicaid Other | Admitting: Cardiology

## 2017-10-09 ENCOUNTER — Ambulatory Visit (INDEPENDENT_AMBULATORY_CARE_PROVIDER_SITE_OTHER): Payer: Medicaid Other | Admitting: *Deleted

## 2017-10-09 ENCOUNTER — Encounter: Payer: Self-pay | Admitting: Cardiology

## 2017-10-09 VITALS — BP 118/62 | HR 61 | Ht 64.0 in | Wt 212.0 lb

## 2017-10-09 DIAGNOSIS — I495 Sick sinus syndrome: Secondary | ICD-10-CM

## 2017-10-09 DIAGNOSIS — I10 Essential (primary) hypertension: Secondary | ICD-10-CM

## 2017-10-09 DIAGNOSIS — Z950 Presence of cardiac pacemaker: Secondary | ICD-10-CM

## 2017-10-09 DIAGNOSIS — M7989 Other specified soft tissue disorders: Secondary | ICD-10-CM | POA: Diagnosis not present

## 2017-10-09 LAB — CUP PACEART INCLINIC DEVICE CHECK
Date Time Interrogation Session: 20190117050000
Implantable Lead Implant Date: 20090123
Implantable Lead Location: 753859
Implantable Lead Location: 753860
Implantable Pulse Generator Implant Date: 20090123
Lead Channel Impedance Value: 370 Ohm
Lead Channel Impedance Value: 530 Ohm
Lead Channel Pacing Threshold Amplitude: 0.8 V
Lead Channel Pacing Threshold Pulse Width: 0.4 ms
Lead Channel Sensing Intrinsic Amplitude: 6.6 mV
MDC IDC LEAD IMPLANT DT: 20090123
MDC IDC MSMT LEADCHNL RV PACING THRESHOLD AMPLITUDE: 0.7 V
MDC IDC MSMT LEADCHNL RV PACING THRESHOLD PULSEWIDTH: 0.4 ms
MDC IDC SET LEADCHNL RA PACING AMPLITUDE: 2 V
MDC IDC SET LEADCHNL RV PACING AMPLITUDE: 2 V
MDC IDC SET LEADCHNL RV PACING PULSEWIDTH: 0.4 ms
MDC IDC SET LEADCHNL RV SENSING SENSITIVITY: 2.5 mV
MDC IDC STAT BRADY RA PERCENT PACED: 44 %
MDC IDC STAT BRADY RV PERCENT PACED: 0 %
Pulse Gen Serial Number: 573704

## 2017-10-09 NOTE — Progress Notes (Signed)
Pacemaker check in clinic. Normal device function. Thresholds, sensing, impedances consistent with previous measurements. Device programmed to maximize longevity. No mode switch or high ventricular rates noted. Device programmed at appropriate safety margins. Histogram distribution appropriate for patient activity level. Device programmed to optimize intrinsic conduction. Estimated longevity <0.5 years. Patient education completed. ROV with GT/R on 12/11/17.

## 2017-10-09 NOTE — Patient Instructions (Signed)

## 2017-10-13 ENCOUNTER — Encounter (HOSPITAL_COMMUNITY): Payer: Self-pay | Admitting: Psychiatry

## 2017-10-13 ENCOUNTER — Ambulatory Visit (INDEPENDENT_AMBULATORY_CARE_PROVIDER_SITE_OTHER): Payer: Medicaid Other | Admitting: Psychiatry

## 2017-10-13 VITALS — BP 170/93 | HR 93 | Ht 64.0 in | Wt 215.0 lb

## 2017-10-13 DIAGNOSIS — F17210 Nicotine dependence, cigarettes, uncomplicated: Secondary | ICD-10-CM

## 2017-10-13 DIAGNOSIS — R450 Nervousness: Secondary | ICD-10-CM | POA: Diagnosis not present

## 2017-10-13 DIAGNOSIS — F419 Anxiety disorder, unspecified: Secondary | ICD-10-CM

## 2017-10-13 DIAGNOSIS — F4310 Post-traumatic stress disorder, unspecified: Secondary | ICD-10-CM

## 2017-10-13 DIAGNOSIS — F331 Major depressive disorder, recurrent, moderate: Secondary | ICD-10-CM

## 2017-10-13 DIAGNOSIS — G4700 Insomnia, unspecified: Secondary | ICD-10-CM | POA: Diagnosis not present

## 2017-10-13 MED ORDER — LORAZEPAM 0.5 MG PO TABS: 0.5000 mg | ORAL_TABLET | Freq: Every day | ORAL | 0 refills | Status: AC | PRN

## 2017-10-13 MED ORDER — DULOXETINE HCL 30 MG PO CPEP: ORAL_CAPSULE | ORAL | 0 refills | Status: AC

## 2017-10-13 MED ORDER — DULOXETINE HCL 60 MG PO CPEP: ORAL_CAPSULE | ORAL | 0 refills | Status: AC

## 2017-10-13 NOTE — Patient Instructions (Addendum)
1. Continue duloxetine 90 mg daily (60 mg + 30 mg) 2. Continue lorazepam 0.5 mg daily as needed for sleep 3.Return to clinic in one month for 30 mins 4. Referral to therapy

## 2017-10-14 ENCOUNTER — Ambulatory Visit (HOSPITAL_COMMUNITY)
Admission: RE | Admit: 2017-10-14 | Discharge: 2017-10-14 | Disposition: A | Discharge status: Home / Self Care | Payer: Medicaid Other | Source: Ambulatory Visit | Attending: Cardiology | Admitting: Cardiology

## 2017-10-14 DIAGNOSIS — M7989 Other specified soft tissue disorders: Secondary | ICD-10-CM | POA: Diagnosis present

## 2017-10-14 DIAGNOSIS — I517 Cardiomegaly: Secondary | ICD-10-CM | POA: Insufficient documentation

## 2017-10-15 ENCOUNTER — Telehealth: Payer: Self-pay

## 2017-10-15 ENCOUNTER — Telehealth: Payer: Self-pay | Admitting: Gastroenterology

## 2017-10-15 ENCOUNTER — Ambulatory Visit: Payer: Self-pay | Admitting: Gastroenterology

## 2017-10-15 LAB — ECHOCARDIOGRAM COMPLETE
AV Area mean vel: 2.43 cm2
AV area mean vel ind: 1.13 cm2/m2
AV pk vel: 158 cm/s
AVA: 2.35 cm2
AVAREAVTI: 2.56 cm2
AVAREAVTIIND: 1.1 cm2/m2
AVCELMEANRAT: 0.7
AVG: 5 mmHg
AVPG: 10 mmHg
Ao pk vel: 0.74 m/s
CHL CUP AV PEAK INDEX: 1.19
CHL CUP AV VALUE AREA INDEX: 1.1
CHL CUP AV VEL: 2.35
CHL CUP DOP CALC LVOT VTI: 23.4 cm
CHL CUP STROKE VOLUME: 42 mL
CHL CUP TV REG PEAK VELOCITY: 284 cm/s
DOP CAL AO MEAN VELOCITY: 95.3 cm/s
EERAT: 6.05
EWDT: 190 ms
FS: 48 % — AB (ref 28–44)
IV/PV OW: 1.1
LA diam end sys: 40 mm
LA diam index: 1.87 cm/m2
LA vol A4C: 26.8 ml
LASIZE: 40 mm
LAVOL: 34 mL
LAVOLIN: 15.9 mL/m2
LDCA: 3.46 cm2
LV E/e' medial: 6.05
LV dias vol: 61 mL (ref 46–106)
LV e' LATERAL: 14.9 cm/s
LV sys vol index: 9 mL/m2
LV sys vol: 19 mL (ref 14–42)
LVDIAVOLIN: 28 mL/m2
LVEEAVG: 6.05
LVOT peak VTI: 0.68 cm
LVOT peak grad rest: 5 mmHg
LVOT peak vel: 117 cm/s
LVOTD: 21 mm
LVOTSV: 81 mL
MV Dec: 190
MV pk A vel: 64.8 m/s
MVPG: 3 mmHg
MVPKEVEL: 90.2 m/s
PW: 10.3 mm — AB (ref 0.6–1.1)
RV LATERAL S' VELOCITY: 13.5 cm/s
RV TAPSE: 23.3 mm
RV sys press: 35 mmHg
Simpson's disk: 68
TDI e' lateral: 14.9
TDI e' medial: 9.36
TR max vel: 284 cm/s
VTI: 34.4 cm

## 2017-10-15 NOTE — Telephone Encounter (Signed)
-----   Message from Jonelle Sidle, MD sent at 10/15/2017 10:33 AM EST ----- Results reviewed. Please let her know that LVEF remains normal at 60-65%, also normal diastolic function. A copy of this test should be forwarded to [facility].

## 2017-10-15 NOTE — Progress Notes (Signed)
*  PRELIMINARY RESULTS* Echocardiogram 2D Echocardiogram has been performed.  Stacey Drain 10/15/2017, 8:42 AM

## 2017-10-15 NOTE — Telephone Encounter (Signed)
Patient notified. Routed to PCP 

## 2017-10-21 ENCOUNTER — Encounter (HOSPITAL_COMMUNITY)
Admission: RE | Admit: 2017-10-21 | Discharge: 2017-10-21 | Disposition: A | Discharge status: Home / Self Care | Payer: Medicaid Other | Source: Ambulatory Visit | Attending: Gastroenterology | Admitting: Gastroenterology

## 2017-10-21 ENCOUNTER — Encounter (HOSPITAL_COMMUNITY): Payer: Self-pay

## 2017-10-21 DIAGNOSIS — K5090 Crohn's disease, unspecified, without complications: Secondary | ICD-10-CM | POA: Diagnosis present

## 2017-10-21 MED ORDER — SODIUM CHLORIDE 0.9 % IV SOLN
INTRAVENOUS | Status: DC
  Administered 2017-10-21: 13:00:00 via INTRAVENOUS

## 2017-10-21 MED ORDER — DIPHENHYDRAMINE HCL 25 MG PO CAPS
50.0000 mg | ORAL_CAPSULE | Freq: Once | ORAL | Status: AC
  Administered 2017-10-21: 50 mg via ORAL
  Filled 2017-10-21: qty 2

## 2017-10-21 MED ORDER — SODIUM CHLORIDE 0.9 % IV SOLN
5.0000 mg/kg | INTRAVENOUS | Status: DC
  Administered 2017-10-21: 400 mg via INTRAVENOUS
  Filled 2017-10-21: qty 40

## 2017-10-21 MED ORDER — ACETAMINOPHEN 325 MG PO TABS
650.0000 mg | ORAL_TABLET | Freq: Once | ORAL | Status: AC
  Administered 2017-10-21: 650 mg via ORAL
  Filled 2017-10-21: qty 2

## 2017-10-21 MED ORDER — ONDANSETRON 4 MG PO TBDP
4.0000 mg | ORAL_TABLET | Freq: Once | ORAL | Status: AC
  Administered 2017-10-21: 4 mg via ORAL
  Filled 2017-10-21: qty 1

## 2017-10-27 ENCOUNTER — Encounter (HOSPITAL_COMMUNITY): Payer: Self-pay | Admitting: Psychiatry

## 2017-10-27 ENCOUNTER — Ambulatory Visit (INDEPENDENT_AMBULATORY_CARE_PROVIDER_SITE_OTHER): Payer: Medicaid Other | Admitting: Psychiatry

## 2017-10-27 DIAGNOSIS — Z91410 Personal history of adult physical and sexual abuse: Secondary | ICD-10-CM | POA: Diagnosis not present

## 2017-10-27 DIAGNOSIS — Z91411 Personal history of adult psychological abuse: Secondary | ICD-10-CM | POA: Diagnosis not present

## 2017-10-27 DIAGNOSIS — F4310 Post-traumatic stress disorder, unspecified: Secondary | ICD-10-CM

## 2017-10-27 DIAGNOSIS — F331 Major depressive disorder, recurrent, moderate: Secondary | ICD-10-CM

## 2017-10-27 DIAGNOSIS — Z62810 Personal history of physical and sexual abuse in childhood: Secondary | ICD-10-CM | POA: Diagnosis not present
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
## 2017-10-28 ENCOUNTER — Ambulatory Visit: Payer: Self-pay | Admitting: Physician Assistant

## 2017-10-29 ENCOUNTER — Other Ambulatory Visit: Payer: Self-pay | Admitting: Neurology

## 2017-10-29 DIAGNOSIS — M545 Low back pain: Secondary | ICD-10-CM

## 2017-11-03 ENCOUNTER — Encounter (HOSPITAL_COMMUNITY): Payer: Self-pay | Admitting: Emergency Medicine

## 2017-11-03 ENCOUNTER — Other Ambulatory Visit: Payer: Self-pay

## 2017-11-03 ENCOUNTER — Emergency Department (HOSPITAL_COMMUNITY): Payer: Medicaid Other

## 2017-11-03 ENCOUNTER — Inpatient Hospital Stay (HOSPITAL_COMMUNITY)
Admission: EM | Admit: 2017-11-03 | Discharge: 2017-12-22 | DRG: 870 | Disposition: E | Payer: Medicaid Other | Attending: Internal Medicine | Admitting: Internal Medicine

## 2017-11-03 DIAGNOSIS — I5021 Acute systolic (congestive) heart failure: Secondary | ICD-10-CM | POA: Diagnosis present

## 2017-11-03 DIAGNOSIS — J9622 Acute and chronic respiratory failure with hypercapnia: Secondary | ICD-10-CM | POA: Diagnosis not present

## 2017-11-03 DIAGNOSIS — J9601 Acute respiratory failure with hypoxia: Secondary | ICD-10-CM | POA: Diagnosis present

## 2017-11-03 DIAGNOSIS — G9340 Encephalopathy, unspecified: Secondary | ICD-10-CM

## 2017-11-03 DIAGNOSIS — Z8673 Personal history of transient ischemic attack (TIA), and cerebral infarction without residual deficits: Secondary | ICD-10-CM

## 2017-11-03 DIAGNOSIS — E1159 Type 2 diabetes mellitus with other circulatory complications: Secondary | ICD-10-CM | POA: Diagnosis present

## 2017-11-03 DIAGNOSIS — J9600 Acute respiratory failure, unspecified whether with hypoxia or hypercapnia: Secondary | ICD-10-CM

## 2017-11-03 DIAGNOSIS — E1143 Type 2 diabetes mellitus with diabetic autonomic (poly)neuropathy: Secondary | ICD-10-CM | POA: Diagnosis present

## 2017-11-03 DIAGNOSIS — J9621 Acute and chronic respiratory failure with hypoxia: Secondary | ICD-10-CM | POA: Diagnosis present

## 2017-11-03 DIAGNOSIS — Z515 Encounter for palliative care: Secondary | ICD-10-CM | POA: Diagnosis not present

## 2017-11-03 DIAGNOSIS — K219 Gastro-esophageal reflux disease without esophagitis: Secondary | ICD-10-CM | POA: Diagnosis present

## 2017-11-03 DIAGNOSIS — R7401 Elevation of levels of liver transaminase levels: Secondary | ICD-10-CM

## 2017-11-03 DIAGNOSIS — E870 Hyperosmolality and hypernatremia: Secondary | ICD-10-CM | POA: Diagnosis not present

## 2017-11-03 DIAGNOSIS — E7220 Disorder of urea cycle metabolism, unspecified: Secondary | ICD-10-CM | POA: Diagnosis not present

## 2017-11-03 DIAGNOSIS — E861 Hypovolemia: Secondary | ICD-10-CM | POA: Diagnosis not present

## 2017-11-03 DIAGNOSIS — I110 Hypertensive heart disease with heart failure: Secondary | ICD-10-CM | POA: Diagnosis present

## 2017-11-03 DIAGNOSIS — J101 Influenza due to other identified influenza virus with other respiratory manifestations: Secondary | ICD-10-CM | POA: Diagnosis not present

## 2017-11-03 DIAGNOSIS — F419 Anxiety disorder, unspecified: Secondary | ICD-10-CM | POA: Diagnosis present

## 2017-11-03 DIAGNOSIS — E11649 Type 2 diabetes mellitus with hypoglycemia without coma: Secondary | ICD-10-CM | POA: Diagnosis not present

## 2017-11-03 DIAGNOSIS — G4733 Obstructive sleep apnea (adult) (pediatric): Secondary | ICD-10-CM | POA: Diagnosis present

## 2017-11-03 DIAGNOSIS — Z888 Allergy status to other drugs, medicaments and biological substances status: Secondary | ICD-10-CM

## 2017-11-03 DIAGNOSIS — A4901 Methicillin susceptible Staphylococcus aureus infection, unspecified site: Secondary | ICD-10-CM | POA: Diagnosis present

## 2017-11-03 DIAGNOSIS — I361 Nonrheumatic tricuspid (valve) insufficiency: Secondary | ICD-10-CM | POA: Diagnosis not present

## 2017-11-03 DIAGNOSIS — J9811 Atelectasis: Secondary | ICD-10-CM | POA: Diagnosis not present

## 2017-11-03 DIAGNOSIS — Z833 Family history of diabetes mellitus: Secondary | ICD-10-CM

## 2017-11-03 DIAGNOSIS — I2510 Atherosclerotic heart disease of native coronary artery without angina pectoris: Secondary | ICD-10-CM | POA: Diagnosis not present

## 2017-11-03 DIAGNOSIS — K3184 Gastroparesis: Secondary | ICD-10-CM | POA: Diagnosis present

## 2017-11-03 DIAGNOSIS — Z9049 Acquired absence of other specified parts of digestive tract: Secondary | ICD-10-CM

## 2017-11-03 DIAGNOSIS — Z881 Allergy status to other antibiotic agents status: Secondary | ICD-10-CM

## 2017-11-03 DIAGNOSIS — E11622 Type 2 diabetes mellitus with other skin ulcer: Secondary | ICD-10-CM | POA: Diagnosis present

## 2017-11-03 DIAGNOSIS — I214 Non-ST elevation (NSTEMI) myocardial infarction: Secondary | ICD-10-CM | POA: Diagnosis present

## 2017-11-03 DIAGNOSIS — Y95 Nosocomial condition: Secondary | ICD-10-CM | POA: Diagnosis present

## 2017-11-03 DIAGNOSIS — E871 Hypo-osmolality and hyponatremia: Secondary | ICD-10-CM | POA: Diagnosis present

## 2017-11-03 DIAGNOSIS — Z90710 Acquired absence of both cervix and uterus: Secondary | ICD-10-CM

## 2017-11-03 DIAGNOSIS — G8250 Quadriplegia, unspecified: Secondary | ICD-10-CM

## 2017-11-03 DIAGNOSIS — E876 Hypokalemia: Secondary | ICD-10-CM | POA: Diagnosis not present

## 2017-11-03 DIAGNOSIS — R0600 Dyspnea, unspecified: Secondary | ICD-10-CM | POA: Diagnosis not present

## 2017-11-03 DIAGNOSIS — Z0189 Encounter for other specified special examinations: Secondary | ICD-10-CM

## 2017-11-03 DIAGNOSIS — F17210 Nicotine dependence, cigarettes, uncomplicated: Secondary | ICD-10-CM | POA: Diagnosis present

## 2017-11-03 DIAGNOSIS — G9341 Metabolic encephalopathy: Secondary | ICD-10-CM | POA: Diagnosis not present

## 2017-11-03 DIAGNOSIS — J449 Chronic obstructive pulmonary disease, unspecified: Secondary | ICD-10-CM | POA: Diagnosis not present

## 2017-11-03 DIAGNOSIS — R740 Nonspecific elevation of levels of transaminase and lactic acid dehydrogenase [LDH]: Secondary | ICD-10-CM | POA: Diagnosis present

## 2017-11-03 DIAGNOSIS — Z950 Presence of cardiac pacemaker: Secondary | ICD-10-CM | POA: Diagnosis present

## 2017-11-03 DIAGNOSIS — R9431 Abnormal electrocardiogram [ECG] [EKG]: Secondary | ICD-10-CM | POA: Diagnosis not present

## 2017-11-03 DIAGNOSIS — R0603 Acute respiratory distress: Secondary | ICD-10-CM | POA: Diagnosis not present

## 2017-11-03 DIAGNOSIS — R0902 Hypoxemia: Secondary | ICD-10-CM | POA: Diagnosis present

## 2017-11-03 DIAGNOSIS — F329 Major depressive disorder, single episode, unspecified: Secondary | ICD-10-CM | POA: Diagnosis present

## 2017-11-03 DIAGNOSIS — Z973 Presence of spectacles and contact lenses: Secondary | ICD-10-CM

## 2017-11-03 DIAGNOSIS — I21A1 Myocardial infarction type 2: Secondary | ICD-10-CM | POA: Diagnosis not present

## 2017-11-03 DIAGNOSIS — E1110 Type 2 diabetes mellitus with ketoacidosis without coma: Secondary | ICD-10-CM | POA: Diagnosis present

## 2017-11-03 DIAGNOSIS — Z823 Family history of stroke: Secondary | ICD-10-CM

## 2017-11-03 DIAGNOSIS — N179 Acute kidney failure, unspecified: Secondary | ICD-10-CM | POA: Diagnosis not present

## 2017-11-03 DIAGNOSIS — Z7951 Long term (current) use of inhaled steroids: Secondary | ICD-10-CM

## 2017-11-03 DIAGNOSIS — J440 Chronic obstructive pulmonary disease with acute lower respiratory infection: Secondary | ICD-10-CM | POA: Diagnosis present

## 2017-11-03 DIAGNOSIS — J441 Chronic obstructive pulmonary disease with (acute) exacerbation: Secondary | ICD-10-CM | POA: Diagnosis present

## 2017-11-03 DIAGNOSIS — Z66 Do not resuscitate: Secondary | ICD-10-CM

## 2017-11-03 DIAGNOSIS — Z8249 Family history of ischemic heart disease and other diseases of the circulatory system: Secondary | ICD-10-CM

## 2017-11-03 DIAGNOSIS — Z978 Presence of other specified devices: Secondary | ICD-10-CM | POA: Diagnosis not present

## 2017-11-03 DIAGNOSIS — J1100 Influenza due to unidentified influenza virus with unspecified type of pneumonia: Secondary | ICD-10-CM | POA: Diagnosis not present

## 2017-11-03 DIAGNOSIS — F4310 Post-traumatic stress disorder, unspecified: Secondary | ICD-10-CM | POA: Diagnosis present

## 2017-11-03 DIAGNOSIS — J1108 Influenza due to unidentified influenza virus with specified pneumonia: Secondary | ICD-10-CM | POA: Diagnosis present

## 2017-11-03 DIAGNOSIS — R748 Abnormal levels of other serum enzymes: Secondary | ICD-10-CM | POA: Diagnosis not present

## 2017-11-03 DIAGNOSIS — Z6836 Body mass index (BMI) 36.0-36.9, adult: Secondary | ICD-10-CM

## 2017-11-03 DIAGNOSIS — Z818 Family history of other mental and behavioral disorders: Secondary | ICD-10-CM

## 2017-11-03 DIAGNOSIS — F17200 Nicotine dependence, unspecified, uncomplicated: Secondary | ICD-10-CM | POA: Diagnosis present

## 2017-11-03 DIAGNOSIS — L89150 Pressure ulcer of sacral region, unstageable: Secondary | ICD-10-CM | POA: Diagnosis present

## 2017-11-03 DIAGNOSIS — K5090 Crohn's disease, unspecified, without complications: Secondary | ICD-10-CM | POA: Diagnosis present

## 2017-11-03 DIAGNOSIS — L8990 Pressure ulcer of unspecified site, unspecified stage: Secondary | ICD-10-CM

## 2017-11-03 DIAGNOSIS — J9602 Acute respiratory failure with hypercapnia: Secondary | ICD-10-CM | POA: Diagnosis not present

## 2017-11-03 DIAGNOSIS — D696 Thrombocytopenia, unspecified: Secondary | ICD-10-CM | POA: Diagnosis present

## 2017-11-03 DIAGNOSIS — Z9981 Dependence on supplemental oxygen: Secondary | ICD-10-CM

## 2017-11-03 DIAGNOSIS — E440 Moderate protein-calorie malnutrition: Secondary | ICD-10-CM | POA: Diagnosis not present

## 2017-11-03 DIAGNOSIS — Z79899 Other long term (current) drug therapy: Secondary | ICD-10-CM

## 2017-11-03 DIAGNOSIS — R1310 Dysphagia, unspecified: Secondary | ICD-10-CM

## 2017-11-03 DIAGNOSIS — A419 Sepsis, unspecified organism: Secondary | ICD-10-CM | POA: Diagnosis present

## 2017-11-03 DIAGNOSIS — Z825 Family history of asthma and other chronic lower respiratory diseases: Secondary | ICD-10-CM

## 2017-11-03 DIAGNOSIS — I5181 Takotsubo syndrome: Secondary | ICD-10-CM | POA: Diagnosis not present

## 2017-11-03 DIAGNOSIS — R945 Abnormal results of liver function studies: Secondary | ICD-10-CM | POA: Diagnosis not present

## 2017-11-03 DIAGNOSIS — M1990 Unspecified osteoarthritis, unspecified site: Secondary | ICD-10-CM | POA: Diagnosis present

## 2017-11-03 DIAGNOSIS — R4702 Dysphasia: Secondary | ICD-10-CM | POA: Diagnosis present

## 2017-11-03 DIAGNOSIS — I959 Hypotension, unspecified: Secondary | ICD-10-CM | POA: Diagnosis not present

## 2017-11-03 DIAGNOSIS — E1140 Type 2 diabetes mellitus with diabetic neuropathy, unspecified: Secondary | ICD-10-CM | POA: Diagnosis present

## 2017-11-03 DIAGNOSIS — J1000 Influenza due to other identified influenza virus with unspecified type of pneumonia: Secondary | ICD-10-CM | POA: Diagnosis present

## 2017-11-03 DIAGNOSIS — G8929 Other chronic pain: Secondary | ICD-10-CM | POA: Diagnosis present

## 2017-11-03 DIAGNOSIS — E1142 Type 2 diabetes mellitus with diabetic polyneuropathy: Secondary | ICD-10-CM | POA: Diagnosis not present

## 2017-11-03 DIAGNOSIS — Z4659 Encounter for fitting and adjustment of other gastrointestinal appliance and device: Secondary | ICD-10-CM

## 2017-11-03 DIAGNOSIS — Z7902 Long term (current) use of antithrombotics/antiplatelets: Secondary | ICD-10-CM

## 2017-11-03 DIAGNOSIS — Z87442 Personal history of urinary calculi: Secondary | ICD-10-CM

## 2017-11-03 DIAGNOSIS — J9690 Respiratory failure, unspecified, unspecified whether with hypoxia or hypercapnia: Secondary | ICD-10-CM

## 2017-11-03 DIAGNOSIS — Z7982 Long term (current) use of aspirin: Secondary | ICD-10-CM

## 2017-11-03 DIAGNOSIS — Z794 Long term (current) use of insulin: Secondary | ICD-10-CM

## 2017-11-03 DIAGNOSIS — Z791 Long term (current) use of non-steroidal anti-inflammatories (NSAID): Secondary | ICD-10-CM

## 2017-11-03 DIAGNOSIS — K222 Esophageal obstruction: Secondary | ICD-10-CM

## 2017-11-03 LAB — CBC WITH DIFFERENTIAL/PLATELET
Basophils Absolute: 0 10*3/uL (ref 0.0–0.1)
Basophils Relative: 0 %
EOS ABS: 0 10*3/uL (ref 0.0–0.7)
Eosinophils Relative: 0 %
HEMATOCRIT: 48.1 % — AB (ref 36.0–46.0)
HEMOGLOBIN: 16.1 g/dL — AB (ref 12.0–15.0)
LYMPHS ABS: 1.2 10*3/uL (ref 0.7–4.0)
LYMPHS PCT: 10 %
MCH: 31.1 pg (ref 26.0–34.0)
MCHC: 33.5 g/dL (ref 30.0–36.0)
MCV: 92.9 fL (ref 78.0–100.0)
MONOS PCT: 16 %
Monocytes Absolute: 2 10*3/uL — ABNORMAL HIGH (ref 0.1–1.0)
NEUTROS ABS: 9.4 10*3/uL — AB (ref 1.7–7.7)
NEUTROS PCT: 74 %
Platelets: 123 10*3/uL — ABNORMAL LOW (ref 150–400)
RBC: 5.18 MIL/uL — ABNORMAL HIGH (ref 3.87–5.11)
RDW: 13.9 % (ref 11.5–15.5)
WBC: 12.6 10*3/uL — AB (ref 4.0–10.5)

## 2017-11-03 LAB — COMPREHENSIVE METABOLIC PANEL
ALT: 27 U/L (ref 14–54)
AST: 31 U/L (ref 15–41)
Albumin: 3.1 g/dL — ABNORMAL LOW (ref 3.5–5.0)
Alkaline Phosphatase: 68 U/L (ref 38–126)
Anion gap: 16 — ABNORMAL HIGH (ref 5–15)
BUN: 29 mg/dL — ABNORMAL HIGH (ref 6–20)
CHLORIDE: 100 mmol/L — AB (ref 101–111)
CO2: 19 mmol/L — AB (ref 22–32)
CREATININE: 1.02 mg/dL — AB (ref 0.44–1.00)
Calcium: 8.7 mg/dL — ABNORMAL LOW (ref 8.9–10.3)
GFR calc non Af Amer: >60 mL/min (ref >60)
Glucose, Bld: 395 mg/dL — ABNORMAL HIGH (ref 65–99)
POTASSIUM: 4.2 mmol/L (ref 3.5–5.1)
SODIUM: 135 mmol/L (ref 135–145)
Total Bilirubin: 0.9 mg/dL (ref 0.3–1.2)
Total Protein: 7.4 g/dL (ref 6.5–8.1)

## 2017-11-03 LAB — BLOOD GAS, ARTERIAL
ACID-BASE DEFICIT: 5.1 mmol/L — AB (ref 0.0–2.0)
BICARBONATE: 20.7 mmol/L (ref 20.0–28.0)
DELIVERY SYSTEMS: POSITIVE
DRAWN BY: 23534
Expiratory PAP: 5
FIO2: 0.5
Inspiratory PAP: 14
O2 Content: 50 L/min
O2 Saturation: 91 %
PH ART: 7.401 (ref 7.350–7.450)
Patient temperature: 37
pCO2 arterial: 30.9 mmHg — ABNORMAL LOW (ref 32.0–48.0)
pO2, Arterial: 66.5 mmHg — ABNORMAL LOW (ref 83.0–108.0)

## 2017-11-03 LAB — URINALYSIS, ROUTINE W REFLEX MICROSCOPIC
BILIRUBIN URINE: NEGATIVE
Ketones, ur: 20 mg/dL — AB
Leukocytes, UA: NEGATIVE
Nitrite: NEGATIVE
PROTEIN: 100 mg/dL — AB
Specific Gravity, Urine: 1.022 (ref 1.005–1.030)
pH: 6 (ref 5.0–8.0)

## 2017-11-03 LAB — URINALYSIS, MICROSCOPIC (REFLEX)

## 2017-11-03 LAB — LIPASE, BLOOD: Lipase: 19 U/L (ref 11–51)

## 2017-11-03 LAB — INFLUENZA PANEL BY PCR (TYPE A & B)
INFLAPCR: POSITIVE — AB
INFLBPCR: NEGATIVE

## 2017-11-03 LAB — TROPONIN I: Troponin I: <0.03 ng/mL (ref <0.03)

## 2017-11-03 LAB — I-STAT CG4 LACTIC ACID, ED: LACTIC ACID, VENOUS: 2.81 mmol/L — AB (ref 0.5–1.9)

## 2017-11-03 LAB — BRAIN NATRIURETIC PEPTIDE: B NATRIURETIC PEPTIDE 5: 36 pg/mL (ref 0.0–100.0)

## 2017-11-03 MED ORDER — LEVOFLOXACIN IN D5W 750 MG/150ML IV SOLN
750.0000 mg | Freq: Once | INTRAVENOUS | Status: AC
  Administered 2017-11-03: 750 mg via INTRAVENOUS
  Filled 2017-11-03: qty 150

## 2017-11-03 MED ORDER — ENOXAPARIN SODIUM 40 MG/0.4ML ~~LOC~~ SOLN
40.0000 mg | SUBCUTANEOUS | Status: DC
  Administered 2017-11-04 – 2017-11-05 (×2): 40 mg via SUBCUTANEOUS
  Filled 2017-11-03 (×2): qty 0.4

## 2017-11-03 MED ORDER — SODIUM CHLORIDE 0.9 % IV BOLUS (SEPSIS)
1000.0000 mL | Freq: Once | INTRAVENOUS | Status: AC
  Administered 2017-11-03: 1000 mL via INTRAVENOUS

## 2017-11-03 MED ORDER — DULOXETINE HCL 30 MG PO CPEP: 30.0000 mg | ORAL_CAPSULE | Freq: Every day | ORAL | Status: DC

## 2017-11-03 MED ORDER — METFORMIN HCL 500 MG PO TABS: 1000.0000 mg | ORAL_TABLET | Freq: Two times a day (BID) | ORAL | Status: DC

## 2017-11-03 MED ORDER — METHYLPREDNISOLONE SODIUM SUCC 125 MG IJ SOLR
125.0000 mg | Freq: Once | INTRAMUSCULAR | Status: AC
Start: 2017-11-03 — End: 2017-11-03
  Administered 2017-11-03: 125 mg via INTRAVENOUS
  Filled 2017-11-03: qty 2

## 2017-11-03 MED ORDER — LORAZEPAM 2 MG/ML IJ SOLN
0.5000 mg | Freq: Once | INTRAMUSCULAR | Status: AC
  Administered 2017-11-03: 0.5 mg via INTRAVENOUS
  Filled 2017-11-03: qty 1

## 2017-11-03 MED ORDER — MOMETASONE FURO-FORMOTEROL FUM 200-5 MCG/ACT IN AERO
2.0000 | INHALATION_SPRAY | Freq: Two times a day (BID) | RESPIRATORY_TRACT | Status: DC
Start: 2017-11-03 — End: 2017-11-04
  Filled 2017-11-03 (×2): qty 8.8

## 2017-11-03 MED ORDER — SODIUM CHLORIDE 0.9% FLUSH
3.0000 mL | Freq: Two times a day (BID) | INTRAVENOUS | Status: DC
  Administered 2017-11-04 (×3): 3 mL via INTRAVENOUS

## 2017-11-03 MED ORDER — PANTOPRAZOLE SODIUM 40 MG PO TBEC: 40.0000 mg | DELAYED_RELEASE_TABLET | Freq: Two times a day (BID) | ORAL | Status: DC

## 2017-11-03 MED ORDER — LORAZEPAM 0.5 MG PO TABS: 0.5000 mg | ORAL_TABLET | Freq: Every day | ORAL | Status: DC | PRN

## 2017-11-03 MED ORDER — ACETAMINOPHEN 325 MG PO TABS
650.0000 mg | ORAL_TABLET | Freq: Four times a day (QID) | ORAL | Status: DC | PRN
  Administered 2017-11-15 – 2017-11-17 (×2): 650 mg via ORAL
  Filled 2017-11-03 (×2): qty 2

## 2017-11-03 MED ORDER — ALBUTEROL SULFATE (2.5 MG/3ML) 0.083% IN NEBU: 2.5000 mg | INHALATION_SOLUTION | Freq: Four times a day (QID) | RESPIRATORY_TRACT | Status: DC

## 2017-11-03 MED ORDER — OSELTAMIVIR PHOSPHATE 75 MG PO CAPS
75.0000 mg | ORAL_CAPSULE | Freq: Two times a day (BID) | ORAL | Status: AC
  Administered 2017-11-04 – 2017-11-08 (×9): 75 mg via ORAL
  Filled 2017-11-03 (×9): qty 1

## 2017-11-03 MED ORDER — LOSARTAN POTASSIUM 50 MG PO TABS: 25.0000 mg | ORAL_TABLET | Freq: Every day | ORAL | Status: DC

## 2017-11-03 MED ORDER — FUROSEMIDE 40 MG PO TABS: 40.0000 mg | ORAL_TABLET | Freq: Every day | ORAL | Status: DC

## 2017-11-03 MED ORDER — VITAMIN D3 25 MCG (1000 UNIT) PO TABS
5000.0000 [IU] | ORAL_TABLET | Freq: Every day | ORAL | Status: DC
  Filled 2017-11-03 (×2): qty 5

## 2017-11-03 MED ORDER — SODIUM CHLORIDE 0.9% FLUSH: 3.0000 mL | INTRAVENOUS | Status: DC | PRN

## 2017-11-03 MED ORDER — OSELTAMIVIR PHOSPHATE 75 MG PO CAPS
75.0000 mg | ORAL_CAPSULE | Freq: Once | ORAL | Status: AC
  Administered 2017-11-03: 75 mg via ORAL
  Filled 2017-11-03: qty 1

## 2017-11-03 MED ORDER — CLOPIDOGREL BISULFATE 75 MG PO TABS
75.0000 mg | ORAL_TABLET | Freq: Every day | ORAL | Status: DC
  Administered 2017-11-05 – 2017-11-18 (×12): 75 mg via ORAL
  Filled 2017-11-03 (×12): qty 1

## 2017-11-03 MED ORDER — SIMVASTATIN 20 MG PO TABS: 10.0000 mg | ORAL_TABLET | Freq: Every day | ORAL | Status: DC

## 2017-11-03 MED ORDER — DICYCLOMINE HCL 20 MG PO TABS
20.0000 mg | ORAL_TABLET | Freq: Three times a day (TID) | ORAL | Status: DC | PRN
  Filled 2017-11-03: qty 1

## 2017-11-03 MED ORDER — INSULIN ASPART 100 UNIT/ML ~~LOC~~ SOLN
0.0000 [IU] | Freq: Every day | SUBCUTANEOUS | Status: DC
  Administered 2017-11-04: 5 [IU] via SUBCUTANEOUS

## 2017-11-03 MED ORDER — TIZANIDINE HCL 4 MG PO TABS
4.0000 mg | ORAL_TABLET | Freq: Three times a day (TID) | ORAL | Status: DC
  Filled 2017-11-03 (×4): qty 1

## 2017-11-03 MED ORDER — GEMFIBROZIL 600 MG PO TABS: 600.0000 mg | ORAL_TABLET | Freq: Two times a day (BID) | ORAL | Status: DC

## 2017-11-03 MED ORDER — POTASSIUM CHLORIDE CRYS ER 10 MEQ PO TBCR
10.0000 meq | EXTENDED_RELEASE_TABLET | Freq: Every day | ORAL | Status: DC
  Filled 2017-11-03 (×2): qty 1

## 2017-11-03 MED ORDER — ALBUTEROL SULFATE (2.5 MG/3ML) 0.083% IN NEBU: 2.5000 mg | INHALATION_SOLUTION | Freq: Four times a day (QID) | RESPIRATORY_TRACT | Status: DC | PRN

## 2017-11-03 MED ORDER — SODIUM CHLORIDE 0.9 % IV SOLN: 250.0000 mL | INTRAVENOUS | Status: DC | PRN

## 2017-11-03 MED ORDER — ASPIRIN EC 81 MG PO TBEC
81.0000 mg | DELAYED_RELEASE_TABLET | Freq: Every day | ORAL | Status: DC
  Administered 2017-11-08 – 2017-11-16 (×2): 81 mg via ORAL
  Filled 2017-11-03 (×3): qty 1

## 2017-11-03 MED ORDER — VANCOMYCIN HCL 10 G IV SOLR
1250.0000 mg | Freq: Two times a day (BID) | INTRAVENOUS | Status: DC
  Administered 2017-11-04 – 2017-11-05 (×4): 1250 mg via INTRAVENOUS
  Filled 2017-11-03 (×7): qty 1250

## 2017-11-03 MED ORDER — INSULIN DETEMIR 100 UNIT/ML ~~LOC~~ SOLN
80.0000 [IU] | Freq: Every day | SUBCUTANEOUS | Status: DC
  Administered 2017-11-04: 80 [IU] via SUBCUTANEOUS
  Filled 2017-11-03: qty 0.8

## 2017-11-03 MED ORDER — COLESTIPOL HCL 1 G PO TABS
1.0000 g | ORAL_TABLET | Freq: Two times a day (BID) | ORAL | Status: DC
  Filled 2017-11-03 (×4): qty 1

## 2017-11-03 MED ORDER — INSULIN ASPART 100 UNIT/ML ~~LOC~~ SOLN: 0.0000 [IU] | Freq: Three times a day (TID) | SUBCUTANEOUS | Status: DC

## 2017-11-03 MED ORDER — CARVEDILOL 3.125 MG PO TABS: 6.2500 mg | ORAL_TABLET | Freq: Two times a day (BID) | ORAL | Status: DC

## 2017-11-03 MED ORDER — TAPENTADOL HCL ER 100 MG PO TB12: 100.0000 mg | ORAL_TABLET | Freq: Two times a day (BID) | ORAL | Status: DC

## 2017-11-03 MED ORDER — TIOTROPIUM BROMIDE MONOHYDRATE 18 MCG IN CAPS
18.0000 ug | ORAL_CAPSULE | Freq: Every day | RESPIRATORY_TRACT | Status: DC
  Filled 2017-11-03: qty 5

## 2017-11-03 MED ORDER — ALBUTEROL SULFATE (2.5 MG/3ML) 0.083% IN NEBU
5.0000 mg | INHALATION_SOLUTION | Freq: Once | RESPIRATORY_TRACT | Status: AC
Start: 2017-11-03 — End: 2017-11-03
  Administered 2017-11-03: 5 mg via RESPIRATORY_TRACT
  Filled 2017-11-03: qty 6

## 2017-11-03 MED ORDER — LEVOFLOXACIN IN D5W 750 MG/150ML IV SOLN
750.0000 mg | INTRAVENOUS | Status: DC
  Administered 2017-11-04 – 2017-11-05 (×2): 750 mg via INTRAVENOUS
  Filled 2017-11-03 (×2): qty 150

## 2017-11-03 MED ORDER — PRAMIPEXOLE DIHYDROCHLORIDE 0.25 MG PO TABS
0.2500 mg | ORAL_TABLET | Freq: Two times a day (BID) | ORAL | Status: DC
  Filled 2017-11-03 (×4): qty 1

## 2017-11-03 MED ORDER — METHYLPREDNISOLONE SODIUM SUCC 125 MG IJ SOLR
80.0000 mg | Freq: Three times a day (TID) | INTRAMUSCULAR | Status: DC
  Administered 2017-11-04 – 2017-11-08 (×14): 80 mg via INTRAVENOUS
  Filled 2017-11-03 (×14): qty 2

## 2017-11-03 MED ORDER — PREGABALIN 75 MG PO CAPS: 150.0000 mg | ORAL_CAPSULE | Freq: Three times a day (TID) | ORAL | Status: DC

## 2017-11-03 MED ORDER — ACETAMINOPHEN 650 MG RE SUPP: 650.0000 mg | Freq: Four times a day (QID) | RECTAL | Status: DC | PRN

## 2017-11-03 MED ORDER — DULOXETINE HCL 60 MG PO CPEP: 60.0000 mg | ORAL_CAPSULE | Freq: Every day | ORAL | Status: DC

## 2017-11-03 MED ORDER — VANCOMYCIN HCL IN DEXTROSE 1-5 GM/200ML-% IV SOLN
1000.0000 mg | Freq: Once | INTRAVENOUS | Status: AC
  Administered 2017-11-03: 1000 mg via INTRAVENOUS
  Filled 2017-11-03: qty 200

## 2017-11-03 NOTE — Progress Notes (Addendum)
Pharmacy Note:  Initial antibiotic(s) regimen of Vancomycin ordered by EDP to treat Sepsis.  CrCl cannot be calculated (Patient's most recent lab result is older than the maximum 21 days allowed.).   Allergies  Allergen Reactions  . Flexeril [Cyclobenzaprine] Hives  . Amoxicillin Hives and Rash    Has patient had a PCN reaction causing immediate rash, facial/tongue/throat swelling, SOB or lightheadedness with hypotension: Yes Has patient had a PCN reaction causing severe rash involving mucus membranes or skin necrosis: Yes Has patient had a PCN reaction that required hospitalization No Has patient had a PCN reaction occurring within the last 10 years: Yes If all of the above answers are "NO", then may proceed with Cephalosporin use.    Vitals:   11/17/2017 1825 11-17-2017 1831  BP:    Pulse: (!) 131   Resp:    Temp:    SpO2: 92% 91%   Anti-infectives (From admission, onward)   Start     Dose/Rate Route Frequency Ordered Stop   November 17, 2017 1800  vancomycin (VANCOCIN) IVPB 1000 mg/200 mL premix     1,000 mg 200 mL/hr over 60 Minutes Intravenous  Once 11/17/2017 1755     11-17-17 1800  levofloxacin (LEVAQUIN) IVPB 750 mg     750 mg 100 mL/hr over 90 Minutes Intravenous  Once 11-17-17 1755       Antimicrobials this admission:  Vanc 2/11 >>  Levaquin 2/11 >>  Tamiflu 2/11  Dose adjustments this admission:  n/a   Microbiology results:  2/11 BCx:   UCx:    Sputum:    MRSA PCR:    Plan: Vancomycin 1250mg  IV every 12 hours. Levaquin 750mg  IV every 24 hours. Monitor labs, micro and vitals.   Mady Gemma, [facility] 11-17-17 6:53 PM

## 2017-11-03 NOTE — ED Notes (Signed)
Pt was informed that we need a urine sample. Pt states that she can not urinate at this time. 

## 2017-11-03 NOTE — ED Notes (Signed)
Patient in 5 LPM of oxygen sats 85%. Respiratory called for Bi-PAP. Dr. Ranae Palms at bedside.

## 2017-11-03 NOTE — H&P (Signed)
TRH H&P   Patient Demographics:    [name], is a 52 y.o. female  MRN: [id number]   DOB - Oct 30, 1965  Admit Date - 11/06/2017  Outpatient Primary MD for the patient is [facility]  Referring MD/NP/PA: Loren Racer  Outpatient Specialists:     Patient coming from: home  Chief Complaint  Patient presents with  . Abdominal Pain      HPI:    [name]  is a 52 y.o. female,  w Copd on home o2, apparently ran out of her breathing treatments and c/o increase in dyspnea. Pt states that her breathing started to become worse on Friday.  + cough w yellow sputum.  Denies fever, chills, cp, palp, brbpr.   Pt notes n/v, diarrhea over the past 2 days.   In ED, pox 80's  Influenza A positive   CXR IMPRESSION: Coarsened interstitial markings, may reflect atypical infection or developing edema.  Cardiac pacing device on the left chest wall.   Na 135, K 4.2, Bun 29, Creatinine 1.02  Ast 31, Alt 27 Alb 3.1 Glucose 395  Wbc 12.6, Hgb 16.1, Plt 123  Bnp pending Troponin <0.03   Pt will be admitted for Acute respiratory failure secondary to influenza A, Copd exacerbation    Review of systems:    In addition to the HPI above, No Fever-chills, No Headache, No changes with Vision or hearing, No problems swallowing food or Liquids, No Chest pain,  No Abdominal pain,  No Blood in stool or Urine, No dysuria, No new skin rashes or bruises, No new joints pains-aches,  No new weakness, tingling, numbness in any extremity, No recent weight gain or loss, No polyuria, polydypsia or polyphagia, No significant Mental Stressors.  A full 10 point Review of Systems was done, except as stated above, all other Review of Systems were negative.   With Past History of the following :    Past Medical History:  Diagnosis Date  . Anxiety   .  Arthritis   . Cardiac pacemaker in situ 2009   DDD [employer] -- [place] 60  . Complication of anesthesia   . COPD with asthma (HCC)    GOLD 2-3 --  pulmologist (last visit 2011) Dr. Marchelle Gearing  . Crohn's disease (HCC)    Large intestine  . Depression 2016   PTSD  . Essential hypertension   . Family history of adverse reaction to anesthesia    father- stop breathing surgery   . Gastroparesis   . GERD (gastroesophageal reflux disease)   . History of hiatal hernia   . History of kidney stones   . History of stroke    Jun 2011 -- right hand weakness  . History of syncope   . Inappropriate sinus tachycardia    Sinus node modification 02-25-2003 by Dr. Lewayne Bunting  . LLQ abdominal tenderness 10/20/2015  .  Neuromuscular disorder (HCC)    neuropathy   . OSA (obstructive sleep apnea)    Study done 2005 -- pt refused CPAP/previously was using nocturnal oxygen until one year ago pt states PCP is monitoring pt without  . Pelvic pain in female   . PONV (postoperative nausea and vomiting)   . Presence of permanent cardiac pacemaker   . PTSD (post-traumatic stress disorder)   . Sinus node dysfunction (HCC)    Symptomatic bradycardia  . Stroke [facility]) 2014   left leg weakness- uses leg brace   . Type 2 diabetes mellitus (HCC)   . Wears glasses       Past Surgical History:  Procedure Laterality Date  . 24 HOUR PH STUDY N/A 12/30/2016   Procedure: 24 HOUR PH STUDY;  Surgeon: Ruffin Frederick, MD;  Location: [facility];  Service: Gastroenterology;  Laterality: N/A;  . ABDOMINAL HYSTERECTOMY    . BILATERAL KNEE ARTHROSCOPY W/ CHONDROMALACIA PATELLA  bilateral ---- 12-26-2010; 08-14-2009;  04-25-2008;  03-09-2007   additional same surgery, Left knee 2005;  x2 2006 ---  Right knee 2005;  2006;  x2  2007  . CARDIAC CATHETERIZATION  05-08-2001  dr Nicki Guadalajara   normal coronary arteries and LVF  . CARDIAC ELECTROPHYSIOLOGY STUDY W/  SINUS NODE MODIFICATION  02-25-2003  dr Sharlot Gowda  [place]  . CARDIAC PACEMAKER PLACEMENT  10-16-2007  dr Lewayne Bunting   DDD-- [facility] 60  . CARDIOVASCULAR STRESS TEST  08-02-2010  dr Diona Browner   normal lexiscan study/  ef 59%  . CESAREAN SECTION  x2  . CHOLECYSTECTOMY  1994  . COLONOSCOPY N/A 05/23/2017   Procedure: COLONOSCOPY;  Surgeon: Benancio Deeds, MD;  Location: [facility];  Service: Gastroenterology;  Laterality: N/A;  . COLONOSCOPY WITH PROPOFOL N/A 09/27/2016   Procedure: COLONOSCOPY WITH PROPOFOL;  Surgeon: Ruffin Frederick, MD;  Location: [facility];  Service: Gastroenterology;  Laterality: N/A;  . CYSTO/  URETEROSCOPIC STONE EXTRACTION  03/ 2005  . CYSTOSCOPY WITH HYDRODISTENSION AND BIOPSY N/A 05/23/2015   Procedure: CYSTOSCOPY/BIOPSY/HYDRODISTENSION;  Surgeon: Su Grand, MD;  Location: [facility];  Service: Urology;  Laterality: N/A;  . ESOPHAGEAL MANOMETRY N/A 12/30/2016   Procedure: ESOPHAGEAL MANOMETRY (EM);  Surgeon: Ruffin Frederick, MD;  Location: [facility];  Service: Gastroenterology;  Laterality: N/A;  . ESOPHAGOGASTRODUODENOSCOPY (EGD) WITH PROPOFOL N/A 09/27/2016   Procedure: ESOPHAGOGASTRODUODENOSCOPY (EGD) WITH PROPOFOL;  Surgeon: Ruffin Frederick, MD;  Location: [facility];  Service: Gastroenterology;  Laterality: N/A;  . ESOPHAGOGASTRODUODENOSCOPY (EGD) WITH PROPOFOL N/A 05/23/2017   Procedure: ESOPHAGOGASTRODUODENOSCOPY (EGD) WITH PROPOFOL;  Surgeon: Benancio Deeds, MD;  Location: [facility];  Service: Gastroenterology;  Laterality: N/A;  . MULTIPLE EXTRACTIONS WITH ALVEOLOPLASTY    . SAVORY DILATION N/A 05/23/2017   Procedure: SAVORY DILATION;  Surgeon: Benancio Deeds, MD;  Location: [facility];  Service: Gastroenterology;  Laterality: N/A;  . TONSILLECTOMY  11-25-2002      Social History:     Social History   Tobacco Use  . Smoking status: Current Every Day Smoker    Packs/day: 2.00    Years: 42.00    Pack years: 84.00     Types: Cigarettes    Start date: 02/02/1973  . Smokeless tobacco: Never Used  . Tobacco comment: smoke about 2 packs because I recently lost my mom.  Substance Use Topics  . Alcohol use: No    Alcohol/week: 0.0 oz     Lives - at home  Mobility - walks  by self  Family History :     Family History  Problem Relation Age of Onset  . Diabetes Mother   . Asthma Mother   . Heart disease Mother   . Cirrhosis Mother   . Stroke Mother   . Depression Mother   . Heart disease Father        Deceased. MI. Mother, 2 brothers, sister, nephew also have heart disease  . Emphysema Father        Died of it. Was pt of Dr. Sherene Sires   . Heart attack Father   . Cancer Father        ? type  . Diabetes Brother   . Heart disease Brother   . Diabetes Sister   . Cirrhosis Sister   . Cirrhosis Maternal Grandmother   . Diabetes Brother   . Heart attack Brother   . Heart disease Brother   . Seizures Brother   . Depression Brother   . Colon cancer Neg Hx   . Stomach cancer Neg Hx   . Esophageal cancer Neg Hx   . Rectal cancer Neg Hx   . Liver cancer Neg Hx       Home Medications:   Prior to Admission medications   Medication Sig Start Date End Date Taking? Authorizing Provider  acetaminophen (TYLENOL) 500 MG tablet Take 500 mg by mouth every 6 (six) hours as needed for mild pain or moderate pain.   Yes [provider]  carvedilol (COREG) 6.25 MG tablet Take 1 tablet (6.25 mg total) by mouth 2 (two) times daily. 07/08/17  Yes Jonelle Sidle, MD  clopidogrel (PLAVIX) 75 MG tablet Take 75 mg by mouth daily.   Yes [provider]  dicyclomine (BENTYL) 20 MG tablet Take 1 tablet (20 mg total) by mouth every 8 (eight) hours as needed for spasms. 04/17/17  Yes Armbruster, Willaim Rayas, MD  diphenoxylate-atropine (LOMOTIL) 2.5-0.025 MG tablet Take 2 tabs by mouth x 1, then 1 tab every 4 hrs PRN, max of 8 tabs in 24 hour time 07/14/17  Yes Armbruster, Willaim Rayas, MD  DULoxetine (CYMBALTA) 30  MG capsule Take 90 mg daily (60 mg + 30 mg) Patient taking differently: Take 30 mg by mouth daily.  10/13/17  Yes Hisada, Barbee Cough, MD  furosemide (LASIX) 40 MG tablet TAKE (1) TABLET BY MOUTH EACH MORNING. 07/08/17  Yes Jonelle Sidle, MD  ibuprofen (ADVIL,MOTRIN) 600 MG tablet Take 600 mg by mouth 2 (two) times daily as needed for moderate pain.    Yes [provider]  insulin aspart (NOVOLOG FLEXPEN) 100 UNIT/ML FlexPen Inject 15-21 Units into the skin 3 (three) times daily with meals. 08/12/16  Yes Nida, Denman George, MD  INVOKANA 100 MG TABS tablet TAKE 1 TABLET BY MOUTH DAILY BEFORE BREAKFAST. 03/31/17  Yes Nida, Denman George, MD  LEVEMIR FLEXTOUCH 100 UNIT/ML Pen INJECT 80 UNITS UNDER THE SKIN EVERY DAY AT 10 PM 11/20/16  Yes Nida, Denman George, MD  lisinopril (PRINIVIL,ZESTRIL) 5 MG tablet Take 5 mg by mouth daily.   Yes [provider]  meloxicam (MOBIC) 15 MG tablet Take 15 mg by mouth daily.   Yes [provider]  metFORMIN (GLUCOPHAGE) 1000 MG tablet TAKE 1 TABLET(1000 MG) BY MOUTH TWICE DAILY WITH A MEAL 08/05/16  Yes Jacquelin Hawking, PA-C  pantoprazole (PROTONIX) 40 MG tablet Take 1 tablet (40 mg total) by mouth 2 (two) times daily. 09/30/17  Yes Armbruster, Willaim Rayas, MD  potassium chloride (K-DUR) 10  MEQ tablet Take 1 tablet (10 mEq total) by mouth daily. 07/08/17 11-05-2017 Yes Jonelle Sidle, MD  pramipexole (MIRAPEX) 0.25 MG tablet Take 0.25 mg by mouth 2 (two) times daily.    Yes [provider]  simvastatin (ZOCOR) 10 MG tablet TAKE (1) TABLET BY MOUTH DAILY. Patient taking differently: TAKE (2) TABLET BY MOUTH DAILY. 05/13/17  Yes Jonelle Sidle, MD  albuterol (PROVENTIL) (2.5 MG/3ML) 0.083% nebulizer solution Take 3 mLs (2.5 mg total) by nebulization every 6 (six) hours as needed for wheezing or shortness of breath. 11/09/15   Dettinger, Elige Radon, MD  aspirin EC 81 MG tablet Take 81 mg by mouth daily.    [provider]    Cholecalciferol (VITAMIN D3) 5000 units CAPS Take 1 capsule (5,000 Units total) by mouth daily. 11/19/16   Roma Kayser, MD  colestipol (COLESTID) 1 g tablet Take 1 tablet (1 g total) by mouth 2 (two) times daily. 04/18/17   Armbruster, Willaim Rayas, MD  diclofenac sodium (VOLTAREN) 1 % GEL Apply 4 g topically 4 (four) times daily. 11/09/15   Dettinger, Elige Radon, MD  DULoxetine (CYMBALTA) 60 MG capsule Take 90 mg daily (60 mg + 30 mg) 10/13/17   Neysa Hotter, MD  gemfibrozil (LOPID) 600 MG tablet TAKE 1 TABLET TWICE DAILY BEFORE MEALS. 03/31/17   Nida, Denman George, MD  inFLIXimab 500 mg in sodium chloride 0.9 % 200 mL Inject 500 mg into the vein every 8 (eight) weeks. 03/11/17   Armbruster, Willaim Rayas, MD  LORazepam (ATIVAN) 0.5 MG tablet Take 1 tablet (0.5 mg total) by mouth daily as needed for anxiety. 10/13/17   Neysa Hotter, MD  losartan (COZAAR) 25 MG tablet Take 1 tablet (25 mg total) by mouth daily. 08/05/16   Roma Kayser, MD  LYRICA 150 MG capsule take 150mg  by mouth three times daily 10/23/16   [provider]  nitroGLYCERIN (NITROSTAT) 0.4 MG SL tablet Place 1 tablet (0.4 mg total) under the tongue every 5 (five) minutes as needed for chest pain. 04/22/16   Devoria Albe, MD  OXYGEN Inhale 3 L/day into the lungs at bedtime.    [provider]  SYMBICORT 160-4.5 MCG/ACT inhaler INHALE 2 PUFFS INTO THE LUNGS EVERY 12 HOURS 08/05/16   Jacquelin Hawking, PA-C  tapentadol (NUCYNTA ER) 100 MG 12 hr tablet Take 100 mg by mouth every 12 (twelve) hours.    [provider]  tiZANidine (ZANAFLEX) 4 MG capsule Take 4 mg by mouth 3 (three) times daily.    [provider]     Allergies:     Allergies  Allergen Reactions  . Flexeril [Cyclobenzaprine] Hives  . Amoxicillin Hives and Rash    Has patient had a PCN reaction causing immediate rash, facial/tongue/throat swelling, SOB or lightheadedness with hypotension: Yes Has patient had a PCN reaction causing  severe rash involving mucus membranes or skin necrosis: Yes Has patient had a PCN reaction that required hospitalization No Has patient had a PCN reaction occurring within the last 10 years: Yes If all of the above answers are "NO", then may proceed with Cephalosporin use.      Physical Exam:   Vitals  Blood pressure (!) 149/88, pulse 88, temperature (!) 102.6 F (39.2 C), temperature source Oral, resp. rate (!) 25, height 5\' 4"  (1.626 m), weight 96.2 kg (212 lb), SpO2 (!) 89 %.   1. General ying in bed in NAD,   2. Normal affect and insight, Not Suicidal or  Homicidal, Awake Alert, Oriented X 3.  3. No F.N deficits, ALL C.Nerves Intact, Strength 5/5 all 4 extremities, Sensation intact all 4 extremities, Plantars down going.  4. Ears and Eyes appear Normal, Conjunctivae clear, PERRLA. Moist Oral Mucosa.  5. Supple Neck, No JVD, No cervical lymphadenopathy appriciated, No Carotid Bruits.  6. Symmetrical Chest wall movement, Good air movement bilaterally, CTAB.  7. RRR, No Gallops, Rubs or Murmurs, No Parasternal Heave.  8. Positive Bowel Sounds, Abdomen Soft, No tenderness, No organomegaly appriciated,No rebound -guarding or rigidity.  9.  No Cyanosis, Normal Skin Turgor, No Skin Rash or Bruise.  10. Good muscle tone,  joints appear normal , no effusions, Normal ROM.  11. No Palpable Lymph Nodes in Neck or Axillae     Data Review:    CBC Recent Labs  Lab 11/20/2017 1752  WBC 12.6*  HGB 16.1*  HCT 48.1*  PLT 123*  MCV 92.9  MCH 31.1  MCHC 33.5  RDW 13.9  LYMPHSABS 1.2  MONOABS 2.0*  EOSABS 0.0  BASOSABS 0.0   ------------------------------------------------------------------------------------------------------------------  Chemistries  Recent Labs  Lab 11/02/2017 1752  NA 135  K 4.2  CL 100*  CO2 19*  GLUCOSE 395*  BUN 29*  CREATININE 1.02*  CALCIUM 8.7*  AST 31  ALT 27  ALKPHOS 68  BILITOT 0.9    ------------------------------------------------------------------------------------------------------------------ estimated creatinine clearance is 73.4 mL/min (A) (by C-G formula based on SCr of 1.02 mg/dL (H)). ------------------------------------------------------------------------------------------------------------------ No results for input(s): TSH, T4TOTAL, T3FREE, THYROIDAB in the last 72 hours.  Invalid input(s): FREET3  Coagulation profile No results for input(s): INR, PROTIME in the last 168 hours. ------------------------------------------------------------------------------------------------------------------- No results for input(s): DDIMER in the last 72 hours. -------------------------------------------------------------------------------------------------------------------  Cardiac Enzymes Recent Labs  Lab 10/27/2017 1752  TROPONINI <0.03   ------------------------------------------------------------------------------------------------------------------    Component Value Date/Time   BNP 36.0 11/02/2017 1753     ---------------------------------------------------------------------------------------------------------------  Urinalysis    Component Value Date/Time   COLORURINE YELLOW 04/01/2015 0633   APPEARANCEUR CLOUDY (A) 04/01/2015 0633   LABSPEC 1.010 04/01/2015 0633   PHURINE 6.0 04/01/2015 0633   GLUCOSEU >1000 (A) 04/01/2015 0633   HGBUR TRACE (A) 04/01/2015 0633   BILIRUBINUR NEGATIVE 04/01/2015 0633   BILIRUBINUR neg 03/23/2015 1735   KETONESUR NEGATIVE 04/01/2015 0633   PROTEINUR NEGATIVE 04/01/2015 0633   UROBILINOGEN 0.2 04/01/2015 0633   NITRITE NEGATIVE 04/01/2015 0633   LEUKOCYTESUR SMALL (A) 04/01/2015 0633    ----------------------------------------------------------------------------------------------------------------   Imaging Results:    Dg Chest Port 1 View  Result Date: 10/28/2017 CLINICAL DATA:  52 year old female with a  history of abdominal pain and cough EXAM: PORTABLE CHEST 1 VIEW COMPARISON:  09/21/2017 FINDINGS: Cardiomediastinal silhouette unchanged in size and contour. Fullness in the central vasculature. Coarsened interstitial markings bilaterally. No pleural effusion. No pneumothorax. Cardiac pacing device on left chest wall. IMPRESSION: Coarsened interstitial markings, may reflect atypical infection or developing edema. Cardiac pacing device on the left chest wall. Electronically Signed   By: Gilmer Mor D.O.   On: 10/24/2017 18:29       Assessment & Plan:    Active Problems:   COPD exacerbation (HCC)   Influenza A    Copd exacerbation Solumedrol 80mg  iv q8h vanco iv, levaquin iv pharmacy to dose spiriva 1puff qday Albuterol neb q6h and q6h prn  Bipap  Influenza A tamiflu 75mg  po bid    Dm2 fsbs and qhs, ISS Cont metformin Cont levemir   CVA Cont aspirin Cont plavix Cont Simvastatin Cont Gemfibrozil  Hypertension Cont losartan Cont carvedilol       DVT Prophylaxis  Lovenox - SCDs  AM Labs Ordered, also please review Full Orders  Family Communication: Admission, patients condition and plan of care including tests being ordered have been discussed with the patient  who indicate understanding and agree with the plan and Code Status.  Code Status FULL CODE  Likely DC to  home  Condition GUARDED   Consults called: none  Admission status: inpatient   Time spent in minutes :45   Pearson Grippe M.D on 2017/11/18 at 8:42 PM  Between 7am to 7pm - Pager - [phone]  . After 7pm go to [email or web address] - password [facility]  [facility] - Office  [phone]

## 2017-11-03 NOTE — ED Provider Notes (Signed)
[facility] EMERGENCY DEPARTMENT Provider Note   CSN: [id number] Arrival date & time: 22-Nov-2017  1722     History   Chief Complaint Chief Complaint  Patient presents with  . Abdominal Pain    HPI [name] is a 52 y.o. female.  HPI Patient with history of severe COPD supposed to be on chronic oxygen presents with worsening shortness of breath, productive cough and fever for the past week.  States her insurance would not cover her oxygen and she has not been using this.  States she ran out of her nebulized medication.  She has family members with similar respiratory illness.  Denies any chest pain.  Has ongoing lower extremity swelling which is unchanged. Past Medical History:  Diagnosis Date  . Anxiety   . Arthritis   . Cardiac pacemaker in situ 2009   DDD [employer] -- [place]TRUNA 60  . Complication of anesthesia   . COPD with asthma (HCC)    GOLD 2-3 --  pulmologist (last visit 2011) Dr. Marchelle Gearing  . Crohn's disease (HCC)    Large intestine  . Depression 2016   PTSD  . Essential hypertension   . Family history of adverse reaction to anesthesia    father- stop breathing surgery   . Gastroparesis   . GERD (gastroesophageal reflux disease)   . History of hiatal hernia   . History of kidney stones   . History of stroke    Jun 2011 -- right hand weakness  . History of syncope   . Inappropriate sinus tachycardia    Sinus node modification 02-25-2003 by Dr. Lewayne Bunting  . LLQ abdominal tenderness 10/20/2015  . Neuromuscular disorder (HCC)    neuropathy   . OSA (obstructive sleep apnea)    Study done 2005 -- pt refused CPAP/previously was using nocturnal oxygen until one year ago pt states PCP is monitoring pt without  . Pelvic pain in female   . PONV (postoperative nausea and vomiting)   . Presence of permanent cardiac pacemaker   . PTSD (post-traumatic stress disorder)   . Sinus node dysfunction (HCC)    Symptomatic bradycardia  . Stroke [facility]) 2014   left  leg weakness- uses leg brace   . Type 2 diabetes mellitus (HCC)   . Wears glasses     Patient Active Problem List   Diagnosis Date Noted  . COPD exacerbation (HCC) 11-22-2017  . Crohn's disease without complication (HCC)   . Vitamin D deficiency 11/19/2016  . Moderate episode of recurrent major depressive disorder (HCC) 11/01/2016  . Chronic diarrhea   . Dysphagia   . PTSD (post-traumatic stress disorder) 08/29/2016  . Acute bronchitis 12/27/2015  . Upper airway cough syndrome 10/24/2015  . Morbid obesity (HCC) 10/24/2015  . Essential hypertension 10/24/2015  . LLQ abdominal tenderness 10/20/2015  . Diabetic neuropathy (HCC) 08/02/2015  . Depression 07/04/2015  . COPD GOLD 0 / still smoking  07/04/2015  . OSA (obstructive sleep apnea) 07/04/2015  . Other specified cardiac dysrhythmias(427.89)   . DM type 2 causing vascular disease (HCC) 08/22/2010  . MIXED HYPERLIPIDEMIA 11/20/2009  . Current smoker 11/03/2009  . IBS 10/23/2009  . CARDIAC PACEMAKER IN SITU 08/29/2009  . ESOPHAGEAL STRICTURE 06/28/2009  . ESOPHAGEAL REFLUX 03/16/2009  . GASTROPARESIS 03/16/2009    Past Surgical History:  Procedure Laterality Date  . 24 HOUR PH STUDY N/A 12/30/2016   Procedure: 24 HOUR PH STUDY;  Surgeon: Ruffin Frederick, MD;  Location: [facility];  Service: Gastroenterology;  Laterality: N/A;  . ABDOMINAL HYSTERECTOMY    . BILATERAL KNEE ARTHROSCOPY W/ CHONDROMALACIA PATELLA  bilateral ---- 12-26-2010; 08-14-2009;  04-25-2008;  03-09-2007   additional same surgery, Left knee 2005;  x2 2006 ---  Right knee 2005;  2006;  x2  2007  . CARDIAC CATHETERIZATION  05-08-2001  dr Nicki Guadalajara   normal coronary arteries and LVF  . CARDIAC ELECTROPHYSIOLOGY STUDY W/  SINUS NODE MODIFICATION  02-25-2003  dr Sharlot Gowda taylor  . CARDIAC PACEMAKER PLACEMENT  10-16-2007  dr Lewayne Bunting   DDD-- [facility] 60  . CARDIOVASCULAR STRESS TEST  08-02-2010  dr Diona Browner   normal lexiscan study/   ef 59%  . CESAREAN SECTION  x2  . CHOLECYSTECTOMY  1994  . COLONOSCOPY N/A 05/23/2017   Procedure: COLONOSCOPY;  Surgeon: Benancio Deeds, MD;  Location: [facility];  Service: Gastroenterology;  Laterality: N/A;  . COLONOSCOPY WITH PROPOFOL N/A 09/27/2016   Procedure: COLONOSCOPY WITH PROPOFOL;  Surgeon: Ruffin Frederick, MD;  Location: [facility];  Service: Gastroenterology;  Laterality: N/A;  . CYSTO/  URETEROSCOPIC STONE EXTRACTION  03/ 2005  . CYSTOSCOPY WITH HYDRODISTENSION AND BIOPSY N/A 05/23/2015   Procedure: CYSTOSCOPY/BIOPSY/HYDRODISTENSION;  Surgeon: Su Grand, MD;  Location: [facility];  Service: Urology;  Laterality: N/A;  . ESOPHAGEAL MANOMETRY N/A 12/30/2016   Procedure: ESOPHAGEAL MANOMETRY (EM);  Surgeon: Ruffin Frederick, MD;  Location: [facility];  Service: Gastroenterology;  Laterality: N/A;  . ESOPHAGOGASTRODUODENOSCOPY (EGD) WITH PROPOFOL N/A 09/27/2016   Procedure: ESOPHAGOGASTRODUODENOSCOPY (EGD) WITH PROPOFOL;  Surgeon: Ruffin Frederick, MD;  Location: [facility];  Service: Gastroenterology;  Laterality: N/A;  . ESOPHAGOGASTRODUODENOSCOPY (EGD) WITH PROPOFOL N/A 05/23/2017   Procedure: ESOPHAGOGASTRODUODENOSCOPY (EGD) WITH PROPOFOL;  Surgeon: Benancio Deeds, MD;  Location: [facility];  Service: Gastroenterology;  Laterality: N/A;  . MULTIPLE EXTRACTIONS WITH ALVEOLOPLASTY    . SAVORY DILATION N/A 05/23/2017   Procedure: SAVORY DILATION;  Surgeon: Benancio Deeds, MD;  Location: [facility];  Service: Gastroenterology;  Laterality: N/A;  . TONSILLECTOMY  11-25-2002    OB History    Gravida Para Term Preterm AB Living   5 2     3 2    SAB TAB Ectopic Multiple Live Births   3               Home Medications    Prior to Admission medications   Medication Sig Start Date End Date Taking? Authorizing Provider  acetaminophen (TYLENOL) 500 MG tablet Take 500 mg by mouth every 6 (six) hours as needed for mild pain or  moderate pain.   Yes [provider]  aspirin EC 81 MG tablet Take 81 mg by mouth daily.   Yes [provider]  carvedilol (COREG) 6.25 MG tablet Take 1 tablet (6.25 mg total) by mouth 2 (two) times daily. 07/08/17  Yes Jonelle Sidle, MD  Cholecalciferol (VITAMIN D3) 5000 units CAPS Take 1 capsule (5,000 Units total) by mouth daily. 11/19/16  Yes Roma Kayser, MD  clopidogrel (PLAVIX) 75 MG tablet Take 75 mg by mouth daily.   Yes [provider]  diclofenac sodium (VOLTAREN) 1 % GEL Apply 4 g topically 4 (four) times daily. Patient taking differently: Apply 2-4 g topically daily as needed (for leg pain).  11/09/15  Yes Dettinger, Elige Radon, MD  dicyclomine (BENTYL) 20 MG tablet Take 1 tablet (20 mg total) by mouth every 8 (eight) hours as needed for spasms. 04/17/17  Yes Armbruster, Willaim Rayas, MD  diphenoxylate-atropine (LOMOTIL) 2.5-0.025 MG tablet Take 2 tabs by mouth x 1, then 1 tab every 4 hrs PRN, max of 8 tabs in 24 hour time 07/14/17  Yes Armbruster, Willaim Rayas, MD  DULoxetine (CYMBALTA) 30 MG capsule Take 90 mg daily (60 mg + 30 mg) Patient taking differently: Take 30 mg by mouth daily.  10/13/17  Yes Neysa Hotter, MD  DULoxetine (CYMBALTA) 60 MG capsule Take 90 mg daily (60 mg + 30 mg) 10/13/17  Yes Hisada, Reina, MD  furosemide (LASIX) 40 MG tablet TAKE (1) TABLET BY MOUTH EACH MORNING. 07/08/17  Yes Jonelle Sidle, MD  ibuprofen (ADVIL,MOTRIN) 600 MG tablet Take 600 mg by mouth 2 (two) times daily as needed for moderate pain.    Yes [provider]  insulin aspart (NOVOLOG FLEXPEN) 100 UNIT/ML FlexPen Inject 15-21 Units into the skin 3 (three) times daily with meals. 08/12/16  Yes Nida, Denman George, MD  INVOKANA 100 MG TABS tablet TAKE 1 TABLET BY MOUTH DAILY BEFORE BREAKFAST. 03/31/17  Yes Nida, Denman George, MD  LEVEMIR FLEXTOUCH 100 UNIT/ML Pen INJECT 80 UNITS UNDER THE SKIN EVERY DAY AT 10 PM 11/20/16  Yes Nida, Denman George, MD    lisinopril (PRINIVIL,ZESTRIL) 5 MG tablet Take 5 mg by mouth daily.   Yes [provider]  LORazepam (ATIVAN) 0.5 MG tablet Take 1 tablet (0.5 mg total) by mouth daily as needed for anxiety. 10/13/17  Yes Neysa Hotter, MD  LYRICA 150 MG capsule take 150mg  by mouth three times daily 10/23/16  Yes [provider]  meloxicam (MOBIC) 15 MG tablet Take 15 mg by mouth daily.   Yes [provider]  metFORMIN (GLUCOPHAGE) 1000 MG tablet TAKE 1 TABLET(1000 MG) BY MOUTH TWICE DAILY WITH A MEAL 08/05/16  Yes Jacquelin Hawking, PA-C  pantoprazole (PROTONIX) 40 MG tablet Take 1 tablet (40 mg total) by mouth 2 (two) times daily. 09/30/17  Yes Armbruster, Willaim Rayas, MD  potassium chloride (K-DUR) 10 MEQ tablet Take 1 tablet (10 mEq total) by mouth daily. 07/08/17 11/20/17 Yes Jonelle Sidle, MD  pramipexole (MIRAPEX) 0.25 MG tablet Take 0.25 mg by mouth 2 (two) times daily.    Yes [provider]  simvastatin (ZOCOR) 10 MG tablet TAKE (1) TABLET BY MOUTH DAILY. Patient taking differently: TAKE (2) TABLET BY MOUTH DAILY. 05/13/17  Yes Jonelle Sidle, MD  tapentadol [facility]) 100 MG 12 hr tablet Take 100 mg by mouth every 12 (twelve) hours.   Yes [provider]  tiZANidine (ZANAFLEX) 4 MG capsule Take 4 mg by mouth 3 (three) times daily.   Yes [provider]  albuterol (PROVENTIL) (2.5 MG/3ML) 0.083% nebulizer solution Take 3 mLs (2.5 mg total) by nebulization every 6 (six) hours as needed for wheezing or shortness of breath. 11/09/15   Dettinger, Elige Radon, MD  colestipol (COLESTID) 1 g tablet Take 1 tablet (1 g total) by mouth 2 (two) times daily. 04/18/17   Armbruster, Willaim Rayas, MD  gemfibrozil (LOPID) 600 MG tablet TAKE 1 TABLET TWICE DAILY BEFORE MEALS. 03/31/17   Roma Kayser, MD  inFLIXimab 500 mg in sodium chloride 0.9 % 200 mL Inject 500 mg into the vein every 8 (eight) weeks. 03/11/17   Armbruster, Willaim Rayas, MD  losartan (COZAAR) 25 MG tablet  Take 1 tablet (25 mg total) by mouth daily. 08/05/16   Roma Kayser, MD  nitroGLYCERIN (NITROSTAT) 0.4 MG SL tablet Place 1 tablet (0.4 mg total) under the tongue every  5 (five) minutes as needed for chest pain. 04/22/16   Devoria Albe, MD  OXYGEN Inhale 3 L/day into the lungs at bedtime.    [provider]  SYMBICORT 160-4.5 MCG/ACT inhaler INHALE 2 PUFFS INTO THE LUNGS EVERY 12 HOURS Patient not taking: Reported on 2017/11/26 08/05/16   Jacquelin Hawking, PA-C    Family History Family History  Problem Relation Age of Onset  . Diabetes Mother   . Asthma Mother   . Heart disease Mother   . Cirrhosis Mother   . Stroke Mother   . Depression Mother   . Heart disease Father        Deceased. MI. Mother, 2 brothers, sister, nephew also have heart disease  . Emphysema Father        Died of it. Was pt of Dr. Sherene Sires   . Heart attack Father   . Cancer Father        ? type  . Diabetes Brother   . Heart disease Brother   . Diabetes Sister   . Cirrhosis Sister   . Cirrhosis Maternal Grandmother   . Diabetes Brother   . Heart attack Brother   . Heart disease Brother   . Seizures Brother   . Depression Brother   . Colon cancer Neg Hx   . Stomach cancer Neg Hx   . Esophageal cancer Neg Hx   . Rectal cancer Neg Hx   . Liver cancer Neg Hx     Social History Social History   Tobacco Use  . Smoking status: Current Every Day Smoker    Packs/day: 2.00    Years: 42.00    Pack years: 84.00    Types: Cigarettes    Start date: 02/02/1973  . Smokeless tobacco: Never Used  . Tobacco comment: smoke about 2 packs because I recently lost my mom.  Substance Use Topics  . Alcohol use: No    Alcohol/week: 0.0 oz  . Drug use: No    Comment: per 10/27/2016  pt no     Allergies   Flexeril [cyclobenzaprine] and Amoxicillin   Review of Systems Review of Systems  Constitutional: Positive for chills, fatigue and fever.  HENT: Positive for congestion.   Eyes: Negative for  photophobia and visual disturbance.  Respiratory: Positive for cough, shortness of breath and wheezing.   Cardiovascular: Positive for leg swelling. Negative for chest pain and palpitations.  Gastrointestinal: Positive for abdominal pain. Negative for diarrhea, nausea and vomiting.  Genitourinary: Negative for dysuria, flank pain, frequency and hematuria.  Musculoskeletal: Negative for back pain, myalgias, neck pain and neck stiffness.  Skin: Negative for rash and wound.  Neurological: Positive for weakness. Negative for dizziness, light-headedness, numbness and headaches.  All other systems reviewed and are negative.    Physical Exam Updated Vital Signs BP (!) 149/88   Pulse 88   Temp (!) 102.6 F (39.2 C) (Oral)   Resp (!) 25   Ht 5\' 4"  (1.626 m)   Wt 96.2 kg (212 lb)   SpO2 (!) 89%   BMI 36.39 kg/m   Physical Exam  Constitutional: She is oriented to person, place, and time. She appears well-developed and well-nourished. She appears distressed.  HENT:  Head: Normocephalic and atraumatic.  White coating on tongue.  Eyes: EOM are normal. Pupils are equal, round, and reactive to light.  Neck: Normal range of motion. Neck supple. No JVD present.  Cardiovascular: Regular rhythm.  Tachycardia.  Pulmonary/Chest:  Increased respiratory effort.  Speaking in few word  sentences.  Diffuse rhonchi and wheezing throughout.  Abdominal: Soft. Bowel sounds are normal. There is no tenderness. There is no rebound and no guarding.  Musculoskeletal: Normal range of motion. She exhibits no edema or tenderness.  1+ bilateral lower extremity edema.  No asymmetry or tenderness.  Neurological: She is alert and oriented to person, place, and time.  Moves all extremities without focal deficit.  Sensation intact.  Skin: Skin is warm and dry. No rash noted. No erythema.  Cyanotic appearing  Psychiatric: She has a normal mood and affect. Her behavior is normal.  Nursing note and vitals  reviewed.    ED Treatments / Results  Labs (all labs ordered are listed, but only abnormal results are displayed) Labs Reviewed  COMPREHENSIVE METABOLIC PANEL - Abnormal; Notable for the following components:      Result Value   Chloride 100 (*)    CO2 19 (*)    Glucose, Bld 395 (*)    BUN 29 (*)    Creatinine, Ser 1.02 (*)    Calcium 8.7 (*)    Albumin 3.1 (*)    Anion gap 16 (*)    All other components within normal limits  CBC WITH DIFFERENTIAL/PLATELET - Abnormal; Notable for the following components:   WBC 12.6 (*)    RBC 5.18 (*)    Hemoglobin 16.1 (*)    HCT 48.1 (*)    Platelets 123 (*)    Neutro Abs 9.4 (*)    Monocytes Absolute 2.0 (*)    All other components within normal limits  BLOOD GAS, ARTERIAL - Abnormal; Notable for the following components:   pCO2 arterial 30.9 (*)    pO2, Arterial 66.5 (*)    Acid-base deficit 5.1 (*)    All other components within normal limits  INFLUENZA PANEL BY PCR (TYPE A & B) - Abnormal; Notable for the following components:   Influenza A By PCR POSITIVE (*)    All other components within normal limits  I-STAT CG4 LACTIC ACID, ED - Abnormal; Notable for the following components:   Lactic Acid, Venous 2.81 (*)    All other components within normal limits  CULTURE, BLOOD (ROUTINE X 2)  CULTURE, BLOOD (ROUTINE X 2)  BRAIN NATRIURETIC PEPTIDE  TROPONIN I  LIPASE, BLOOD  URINALYSIS, ROUTINE W REFLEX MICROSCOPIC  I-STAT CG4 LACTIC ACID, ED    EKG  EKG Interpretation  Date/Time:  Monday November 03 2017 17:49:35 EST Ventricular Rate:  96 PR Interval:    QRS Duration: 80 QT Interval:  339 QTC Calculation: 429 R Axis:   -95 Text Interpretation:  Sinus rhythm Probable left atrial enlargement Left anterior fascicular block Anterior infarct, old Confirmed by Loren Racer ([id number]) on 11/19/2017 8:35:44 PM       Radiology Dg Chest Port 1 View  Result Date: 11/02/2017 CLINICAL DATA:  52 year old female with a history of  abdominal pain and cough EXAM: PORTABLE CHEST 1 VIEW COMPARISON:  09/21/2017 FINDINGS: Cardiomediastinal silhouette unchanged in size and contour. Fullness in the central vasculature. Coarsened interstitial markings bilaterally. No pleural effusion. No pneumothorax. Cardiac pacing device on left chest wall. IMPRESSION: Coarsened interstitial markings, may reflect atypical infection or developing edema. Cardiac pacing device on the left chest wall. Electronically Signed   By: Gilmer Mor D.O.   On: 11/12/2017 18:29    Procedures .Critical Care Performed by: Loren Racer, MD Authorized by: Loren Racer, MD   Critical care provider statement:    Critical care time (minutes):  40  Critical care start time:  10/30/2017 5:30 PM   Critical care end time:  11/15/2017 6:10 PM   Critical care was necessary to treat or prevent imminent or life-threatening deterioration of the following conditions:  Respiratory failure and sepsis   Critical care was time spent personally by me on the following activities:  Discussions with consultants, development of treatment plan with patient or surrogate, evaluation of patient's response to treatment, examination of patient, obtaining history from patient or surrogate, re-evaluation of patient's condition, pulse oximetry, ordering and review of laboratory studies, ordering and review of radiographic studies and ordering and performing treatments and interventions   (including critical care time)  Medications Ordered in ED Medications  insulin aspart (novoLOG) injection 0-9 Units (not administered)  insulin aspart (novoLOG) injection 0-5 Units (not administered)  vancomycin (VANCOCIN) IVPB 1000 mg/200 mL premix (0 mg Intravenous Stopped 11/12/2017 1957)  sodium chloride 0.9 % bolus 1,000 mL (0 mLs Intravenous Stopped 11/18/2017 2035)    And  sodium chloride 0.9 % bolus 1,000 mL (0 mLs Intravenous Stopped 11/16/2017 2035)    And  sodium chloride 0.9 % bolus 1,000 mL  (0 mLs Intravenous Stopped 10/29/2017 1957)  levofloxacin (LEVAQUIN) IVPB 750 mg (0 mg Intravenous Stopped 10/26/2017 1957)  albuterol (PROVENTIL) (2.5 MG/3ML) 0.083% nebulizer solution 5 mg (5 mg Nebulization Given 10/27/2017 1830)  methylPREDNISolone sodium succinate (SOLU-MEDROL) 125 mg/2 mL injection 125 mg (125 mg Intravenous Given 11/16/2017 1810)  oseltamivir (TAMIFLU) capsule 75 mg (75 mg Oral Given 11/14/2017 2010)  LORazepam (ATIVAN) injection 0.5 mg (0.5 mg Intravenous Given 10/25/2017 2010)     Initial Impression / Assessment and Plan / ED Course  I have reviewed the triage vital signs and the nursing notes.  Pertinent labs & imaging results that were available during my care of the patient were reviewed by me and considered in my medical decision making (see chart for details).     We will place on BiPAP.  Will treat for likely sepsis including broad-spectrum antibiotics and IV fluids.  Patient recently had echo with normal ejection fraction.  Patient continues to have increased work of breathing the breath sounds have improved.  There may be an anxious component.  Given low-dose Ativan.  Discussed with hospitalist will see patient in emergency department and admit. Final Clinical Impressions(s) / ED Diagnoses   Final diagnoses:  Acute respiratory failure with hypoxia [facility])  Influenza A    ED Discharge Orders    None       Loren Racer, MD 11/18/2017 2038

## 2017-11-03 NOTE — ED Notes (Signed)
CRITICAL VALUE ALERT  Critical Value:  Lactic Acid 2.81  Date & Time Notied:  2/11//2019 @ 1828  Provider Notified: Dr. Ranae Palms made aware  Orders Received/Actions taken: No new orders obtained.

## 2017-11-03 NOTE — ED Triage Notes (Signed)
Pt reports abd pain, cough, congestion, emesis, diarrhea for last several days. Pt reports last dose of tylenol last night.

## 2017-11-04 ENCOUNTER — Other Ambulatory Visit: Payer: Self-pay

## 2017-11-04 ENCOUNTER — Inpatient Hospital Stay (HOSPITAL_COMMUNITY): Payer: Medicaid Other

## 2017-11-04 ENCOUNTER — Encounter (HOSPITAL_COMMUNITY): Payer: Self-pay | Admitting: *Deleted

## 2017-11-04 DIAGNOSIS — Z9911 Dependence on respirator [ventilator] status: Secondary | ICD-10-CM

## 2017-11-04 DIAGNOSIS — I5021 Acute systolic (congestive) heart failure: Secondary | ICD-10-CM

## 2017-11-04 DIAGNOSIS — I361 Nonrheumatic tricuspid (valve) insufficiency: Secondary | ICD-10-CM

## 2017-11-04 LAB — BASIC METABOLIC PANEL
ANION GAP: 18 — AB (ref 5–15)
ANION GAP: 15 (ref 5–15)
ANION GAP: 13 (ref 5–15)
Anion gap: 13 (ref 5–15)
BUN: 33 mg/dL — ABNORMAL HIGH (ref 6–20)
BUN: 38 mg/dL — ABNORMAL HIGH (ref 6–20)
BUN: 40 mg/dL — ABNORMAL HIGH (ref 6–20)
BUN: 41 mg/dL — ABNORMAL HIGH (ref 6–20)
CALCIUM: 8.4 mg/dL — AB (ref 8.9–10.3)
CALCIUM: 8.4 mg/dL — AB (ref 8.9–10.3)
CALCIUM: 8.7 mg/dL — AB (ref 8.9–10.3)
CALCIUM: 8.5 mg/dL — AB (ref 8.9–10.3)
CHLORIDE: 102 mmol/L (ref 101–111)
CHLORIDE: 104 mmol/L (ref 101–111)
CO2: 15 mmol/L — ABNORMAL LOW (ref 22–32)
CO2: 16 mmol/L — ABNORMAL LOW (ref 22–32)
CO2: 17 mmol/L — ABNORMAL LOW (ref 22–32)
CO2: 18 mmol/L — ABNORMAL LOW (ref 22–32)
CREATININE: 1.23 mg/dL — AB (ref 0.44–1.00)
CREATININE: 1.23 mg/dL — AB (ref 0.44–1.00)
CREATININE: 1.28 mg/dL — AB (ref 0.44–1.00)
CREATININE: 1.31 mg/dL — AB (ref 0.44–1.00)
Chloride: 108 mmol/L (ref 101–111)
Chloride: 106 mmol/L (ref 101–111)
GFR calc Af Amer: 58 mL/min — ABNORMAL LOW (ref >60)
GFR calc non Af Amer: 50 mL/min — ABNORMAL LOW (ref >60)
GFR calc non Af Amer: 50 mL/min — ABNORMAL LOW (ref >60)
GFR calc non Af Amer: 48 mL/min — ABNORMAL LOW (ref >60)
GFR, EST AFRICAN AMERICAN: 58 mL/min — AB (ref >60)
GFR, EST AFRICAN AMERICAN: 55 mL/min — AB (ref >60)
GFR, EST AFRICAN AMERICAN: 54 mL/min — AB (ref >60)
GFR, EST NON AFRICAN AMERICAN: 46 mL/min — AB (ref >60)
Glucose, Bld: 482 mg/dL — ABNORMAL HIGH (ref 65–99)
Glucose, Bld: 403 mg/dL — ABNORMAL HIGH (ref 65–99)
Glucose, Bld: 265 mg/dL — ABNORMAL HIGH (ref 65–99)
Glucose, Bld: 134 mg/dL — ABNORMAL HIGH (ref 65–99)
Potassium: 4.4 mmol/L (ref 3.5–5.1)
Potassium: 3.8 mmol/L (ref 3.5–5.1)
Potassium: 3.5 mmol/L (ref 3.5–5.1)
Potassium: 3.6 mmol/L (ref 3.5–5.1)
SODIUM: 135 mmol/L (ref 135–145)
SODIUM: 135 mmol/L (ref 135–145)
SODIUM: 138 mmol/L (ref 135–145)
SODIUM: 137 mmol/L (ref 135–145)

## 2017-11-04 LAB — ECHOCARDIOGRAM COMPLETE
AVLVOTPG: 4 mmHg
CHL CUP MV DEC (S): 176
EERAT: 17.55
EWDT: 176 ms
FS: 19 % — AB (ref 28–44)
Height: 64 in
IVS/LV PW RATIO, ED: 1.17
LA vol A4C: 31.5 ml
LA vol index: 15.6 mL/m2
LA vol: 32.2 mL
LADIAMINDEX: 1.31 cm/m2
LASIZE: 27 mm
LEFT ATRIUM END SYS DIAM: 27 mm
LV E/e' medial: 17.55
LV PW d: 9.57 mm — AB (ref 0.6–1.1)
LV sys vol index: 26 mL/m2
LVDIAVOL: 85 mL (ref 46–106)
LVDIAVOLIN: 41 mL/m2
LVEEAVG: 17.55
LVOT SV: 47 mL
LVOT VTI: 15 cm
LVOT area: 3.14 cm2
LVOT diameter: 20 mm
LVOTPV: 94.5 cm/s
LVSYSVOL: 54 mL — AB (ref 14–42)
MV Peak grad: 2 mmHg
MV pk E vel: 72.5 m/s
RV LATERAL S' VELOCITY: 10.2 cm/s
RV TAPSE: 18.7 mm
Reg peak vel: 278 cm/s
Simpson's disk: 37
Stroke v: 31 ml
TDI e' medial: 4.13
TR max vel: 278 cm/s
Weight: 3206.37 oz

## 2017-11-04 LAB — BLOOD GAS, ARTERIAL
ACID-BASE DEFICIT: 9.2 mmol/L — AB (ref 0.0–2.0)
BICARBONATE: 19.7 mmol/L — AB (ref 20.0–28.0)
Bicarbonate: 17.8 mmol/L — ABNORMAL LOW (ref 20.0–28.0)
DELIVERY SYSTEMS: POSITIVE
Drawn by: 31777
Drawn by: 270161
EXPIRATORY PAP: 5
FIO2: 60
FIO2: 70
Inspiratory PAP: 14
LHR: 10 {breaths}/min
O2 SAT: 96.6 %
O2 Saturation: 89 %
PATIENT TEMPERATURE: 37.4
PCO2 ART: 26.8 mmHg — AB (ref 32.0–48.0)
PEEP/CPAP: 5 cmH2O
PH ART: 7.368 (ref 7.350–7.450)
RATE: 22 resp/min
VT: 500 mL
pCO2 arterial: 33.3 mmHg (ref 32.0–48.0)
pH, Arterial: 7.361 (ref 7.350–7.450)
pO2, Arterial: 62.6 mmHg — ABNORMAL LOW (ref 83.0–108.0)
pO2, Arterial: 99.1 mmHg (ref 83.0–108.0)

## 2017-11-04 LAB — GLUCOSE, CAPILLARY
GLUCOSE-CAPILLARY: 181 mg/dL — AB (ref 65–99)
GLUCOSE-CAPILLARY: 189 mg/dL — AB (ref 65–99)
GLUCOSE-CAPILLARY: 211 mg/dL — AB (ref 65–99)
GLUCOSE-CAPILLARY: 228 mg/dL — AB (ref 65–99)
GLUCOSE-CAPILLARY: 271 mg/dL — AB (ref 65–99)
GLUCOSE-CAPILLARY: 316 mg/dL — AB (ref 65–99)
GLUCOSE-CAPILLARY: 385 mg/dL — AB (ref 65–99)
GLUCOSE-CAPILLARY: 394 mg/dL — AB (ref 65–99)
GLUCOSE-CAPILLARY: 483 mg/dL — AB (ref 65–99)
Glucose-Capillary: 366 mg/dL — ABNORMAL HIGH (ref 65–99)
Glucose-Capillary: 466 mg/dL — ABNORMAL HIGH (ref 65–99)
Glucose-Capillary: 436 mg/dL — ABNORMAL HIGH (ref 65–99)
Glucose-Capillary: 432 mg/dL — ABNORMAL HIGH (ref 65–99)
Glucose-Capillary: 313 mg/dL — ABNORMAL HIGH (ref 65–99)
Glucose-Capillary: 152 mg/dL — ABNORMAL HIGH (ref 65–99)
Glucose-Capillary: 154 mg/dL — ABNORMAL HIGH (ref 65–99)
Glucose-Capillary: 174 mg/dL — ABNORMAL HIGH (ref 65–99)

## 2017-11-04 LAB — I-STAT CG4 LACTIC ACID, ED: Lactic Acid, Venous: 1.89 mmol/L (ref 0.5–1.9)

## 2017-11-04 LAB — COMPREHENSIVE METABOLIC PANEL
ALT: 25 U/L (ref 14–54)
AST: 42 U/L — ABNORMAL HIGH (ref 15–41)
Albumin: 3 g/dL — ABNORMAL LOW (ref 3.5–5.0)
Alkaline Phosphatase: 67 U/L (ref 38–126)
Anion gap: 18 — ABNORMAL HIGH (ref 5–15)
BUN: 27 mg/dL — AB (ref 6–20)
CHLORIDE: 102 mmol/L (ref 101–111)
CO2: 15 mmol/L — AB (ref 22–32)
CREATININE: 0.95 mg/dL (ref 0.44–1.00)
Calcium: 8.2 mg/dL — ABNORMAL LOW (ref 8.9–10.3)
GFR calc Af Amer: >60 mL/min (ref >60)
GFR calc non Af Amer: >60 mL/min (ref >60)
GLUCOSE: 453 mg/dL — AB (ref 65–99)
Potassium: 4.5 mmol/L (ref 3.5–5.1)
SODIUM: 135 mmol/L (ref 135–145)
Total Bilirubin: 1.2 mg/dL (ref 0.3–1.2)
Total Protein: 7.3 g/dL (ref 6.5–8.1)

## 2017-11-04 LAB — CBC
HCT: 47.8 % — ABNORMAL HIGH (ref 36.0–46.0)
Hemoglobin: 15.6 g/dL — ABNORMAL HIGH (ref 12.0–15.0)
MCH: 31 pg (ref 26.0–34.0)
MCHC: 32.6 g/dL (ref 30.0–36.0)
MCV: 95 fL (ref 78.0–100.0)
PLATELETS: 118 10*3/uL — AB (ref 150–400)
RBC: 5.03 MIL/uL (ref 3.87–5.11)
RDW: 14.2 % (ref 11.5–15.5)
WBC: 11.4 10*3/uL — ABNORMAL HIGH (ref 4.0–10.5)

## 2017-11-04 LAB — HEMOGLOBIN A1C
HEMOGLOBIN A1C: 8.8 % — AB (ref 4.8–5.6)
MEAN PLASMA GLUCOSE: 205.86 mg/dL

## 2017-11-04 LAB — BRAIN NATRIURETIC PEPTIDE: B Natriuretic Peptide: 368 pg/mL — ABNORMAL HIGH (ref 0.0–100.0)

## 2017-11-04 LAB — TROPONIN I
TROPONIN I: 1.04 ng/mL — AB (ref <0.03)
Troponin I: 0.9 ng/mL (ref <0.03)

## 2017-11-04 LAB — TRIGLYCERIDES: TRIGLYCERIDES: 328 mg/dL — AB (ref <150)

## 2017-11-04 LAB — MRSA PCR SCREENING: MRSA by PCR: NEGATIVE

## 2017-11-04 LAB — LACTIC ACID, PLASMA
Lactic Acid, Venous: 2.3 mmol/L (ref 0.5–1.9)
Lactic Acid, Venous: 2.2 mmol/L (ref 0.5–1.9)

## 2017-11-04 MED ORDER — POTASSIUM CHLORIDE 10 MEQ/100ML IV SOLN
10.0000 meq | INTRAVENOUS | Status: AC
  Administered 2017-11-04 (×2): 10 meq via INTRAVENOUS
  Filled 2017-11-04 (×2): qty 100

## 2017-11-04 MED ORDER — SODIUM CHLORIDE 0.9 % IV SOLN: Freq: Once | INTRAVENOUS | Status: DC

## 2017-11-04 MED ORDER — CHLORHEXIDINE GLUCONATE 0.12% ORAL RINSE (MEDLINE KIT)
15.0000 mL | Freq: Two times a day (BID) | OROMUCOSAL | Status: DC
  Administered 2017-11-04 – 2017-11-05 (×3): 15 mL via OROMUCOSAL

## 2017-11-04 MED ORDER — PROPOFOL 1000 MG/100ML IV EMUL
5.0000 ug/kg/min | Freq: Once | INTRAVENOUS | Status: AC
  Administered 2017-11-04: 5 ug/kg/min via INTRAVENOUS
  Filled 2017-11-04: qty 100

## 2017-11-04 MED ORDER — MORPHINE SULFATE (PF) 2 MG/ML IV SOLN
INTRAVENOUS | Status: AC
  Filled 2017-11-04: qty 1

## 2017-11-04 MED ORDER — ORAL CARE MOUTH RINSE
15.0000 mL | Freq: Four times a day (QID) | OROMUCOSAL | Status: DC
  Administered 2017-11-04 – 2017-11-05 (×6): 15 mL via OROMUCOSAL

## 2017-11-04 MED ORDER — MORPHINE SULFATE (PF) 2 MG/ML IV SOLN: 2.0000 mg | Freq: Once | INTRAVENOUS | Status: DC

## 2017-11-04 MED ORDER — LEVALBUTEROL HCL 0.63 MG/3ML IN NEBU
0.6300 mg | INHALATION_SOLUTION | Freq: Four times a day (QID) | RESPIRATORY_TRACT | Status: DC
  Administered 2017-11-04 – 2017-11-21 (×69): 0.63 mg via RESPIRATORY_TRACT
  Filled 2017-11-04 (×70): qty 3

## 2017-11-04 MED ORDER — FUROSEMIDE 10 MG/ML IJ SOLN
40.0000 mg | Freq: Once | INTRAMUSCULAR | Status: AC
  Administered 2017-11-04: 40 mg via INTRAVENOUS

## 2017-11-04 MED ORDER — DEXTROSE-NACL 5-0.45 % IV SOLN
INTRAVENOUS | Status: DC
  Administered 2017-11-04 – 2017-11-07 (×3): via INTRAVENOUS

## 2017-11-04 MED ORDER — BUDESONIDE 0.5 MG/2ML IN SUSP
0.5000 mg | Freq: Two times a day (BID) | RESPIRATORY_TRACT | Status: DC
  Administered 2017-11-05 – 2017-11-21 (×33): 0.5 mg via RESPIRATORY_TRACT
  Filled 2017-11-04 (×34): qty 2

## 2017-11-04 MED ORDER — FUROSEMIDE 10 MG/ML IJ SOLN
INTRAMUSCULAR | Status: AC
  Filled 2017-11-04: qty 4

## 2017-11-04 MED ORDER — PANTOPRAZOLE SODIUM 40 MG IV SOLR
40.0000 mg | Freq: Every day | INTRAVENOUS | Status: DC
  Administered 2017-11-04 – 2017-11-11 (×8): 40 mg via INTRAVENOUS
  Filled 2017-11-04 (×8): qty 40

## 2017-11-04 MED ORDER — LORAZEPAM 2 MG/ML IJ SOLN
INTRAMUSCULAR | Status: AC
  Filled 2017-11-04: qty 1

## 2017-11-04 MED ORDER — FENTANYL CITRATE (PF) 100 MCG/2ML IJ SOLN
100.0000 ug | INTRAMUSCULAR | Status: DC | PRN
  Administered 2017-11-06 – 2017-11-11 (×12): 100 ug via INTRAVENOUS
  Filled 2017-11-04 (×8): qty 2

## 2017-11-04 MED ORDER — LORAZEPAM 2 MG/ML IJ SOLN
2.0000 mg | Freq: Once | INTRAMUSCULAR | Status: AC
  Administered 2017-11-04: 2 mg via INTRAVENOUS

## 2017-11-04 MED ORDER — LEVALBUTEROL HCL 0.63 MG/3ML IN NEBU
0.6300 mg | INHALATION_SOLUTION | Freq: Four times a day (QID) | RESPIRATORY_TRACT | Status: DC | PRN
  Administered 2017-11-09: 0.63 mg via RESPIRATORY_TRACT
  Filled 2017-11-04: qty 3

## 2017-11-04 MED ORDER — MORPHINE SULFATE (PF) 2 MG/ML IV SOLN
1.0000 mg | Freq: Once | INTRAVENOUS | Status: AC
  Administered 2017-11-04: 1 mg via INTRAVENOUS

## 2017-11-04 MED ORDER — IPRATROPIUM BROMIDE 0.02 % IN SOLN
0.5000 mg | Freq: Four times a day (QID) | RESPIRATORY_TRACT | Status: DC
  Administered 2017-11-05: 0.5 mg via RESPIRATORY_TRACT
  Filled 2017-11-04: qty 2.5

## 2017-11-04 MED ORDER — DICYCLOMINE HCL 10 MG PO CAPS
20.0000 mg | ORAL_CAPSULE | Freq: Three times a day (TID) | ORAL | Status: DC | PRN
  Filled 2017-11-04: qty 2

## 2017-11-04 MED ORDER — FENTANYL CITRATE (PF) 100 MCG/2ML IJ SOLN
100.0000 ug | INTRAMUSCULAR | Status: DC | PRN
  Filled 2017-11-04 (×4): qty 2

## 2017-11-04 MED ORDER — CEFEPIME HCL 1 G IJ SOLR
1.0000 g | Freq: Three times a day (TID) | INTRAMUSCULAR | Status: AC
  Administered 2017-11-04 – 2017-11-12 (×24): 1 g via INTRAVENOUS
  Filled 2017-11-04 (×24): qty 1

## 2017-11-04 MED ORDER — SODIUM CHLORIDE 0.9 % IV SOLN
Freq: Once | INTRAVENOUS | Status: AC
  Administered 2017-11-04: 09:00:00 via INTRAVENOUS

## 2017-11-04 MED ORDER — SODIUM CHLORIDE 0.9 % IV SOLN
INTRAVENOUS | Status: AC
  Administered 2017-11-04: 16.9 [IU]/h via INTRAVENOUS
  Administered 2017-11-04: 4.2 [IU]/h via INTRAVENOUS
  Administered 2017-11-05: 7.9 [IU]/h via INTRAVENOUS
  Administered 2017-11-05: 10.8 [IU]/h via INTRAVENOUS
  Administered 2017-11-06: 10.1 [IU]/h via INTRAVENOUS
  Administered 2017-11-06: 12.2 [IU]/h via INTRAVENOUS
  Administered 2017-11-07: 19.6 [IU]/h via INTRAVENOUS
  Administered 2017-11-07 (×2): 7.5 [IU]/h via INTRAVENOUS
  Administered 2017-11-08: 9.4 [IU]/h via INTRAVENOUS
  Administered 2017-11-08: 10.2 [IU]/h via INTRAVENOUS
  Administered 2017-11-09: 13.9 [IU]/h via INTRAVENOUS
  Filled 2017-11-04 (×11): qty 1

## 2017-11-04 MED ORDER — ORAL CARE MOUTH RINSE: 15.0000 mL | Freq: Four times a day (QID) | OROMUCOSAL | Status: DC

## 2017-11-04 MED ORDER — FUROSEMIDE 10 MG/ML IJ SOLN
40.0000 mg | Freq: Two times a day (BID) | INTRAMUSCULAR | Status: DC
  Administered 2017-11-04 – 2017-11-05 (×3): 40 mg via INTRAVENOUS
  Filled 2017-11-04 (×3): qty 4

## 2017-11-04 MED ORDER — CHLORHEXIDINE GLUCONATE 0.12% ORAL RINSE (MEDLINE KIT): 15.0000 mL | Freq: Two times a day (BID) | OROMUCOSAL | Status: DC

## 2017-11-04 MED ORDER — PROPOFOL 1000 MG/100ML IV EMUL
5.0000 ug/kg/min | INTRAVENOUS | Status: DC
  Administered 2017-11-04: 65 ug/kg/min via INTRAVENOUS
  Administered 2017-11-04 (×3): 55 ug/kg/min via INTRAVENOUS
  Administered 2017-11-04: 65 ug/kg/min via INTRAVENOUS
  Administered 2017-11-04: 65.09 ug/kg/min via INTRAVENOUS
  Administered 2017-11-05 – 2017-11-07 (×17): 55 ug/kg/min via INTRAVENOUS
  Administered 2017-11-07: 55.006 ug/kg/min via INTRAVENOUS
  Administered 2017-11-07: 55 ug/kg/min via INTRAVENOUS
  Administered 2017-11-07: 55.006 ug/kg/min via INTRAVENOUS
  Administered 2017-11-07 – 2017-11-08 (×5): 55 ug/kg/min via INTRAVENOUS
  Administered 2017-11-08 (×2): 35 ug/kg/min via INTRAVENOUS
  Administered 2017-11-09 – 2017-11-10 (×7): 55 ug/kg/min via INTRAVENOUS
  Administered 2017-11-10 (×4): 65 ug/kg/min via INTRAVENOUS
  Administered 2017-11-10 (×2): 55 ug/kg/min via INTRAVENOUS
  Administered 2017-11-11: 60 ug/kg/min via INTRAVENOUS
  Administered 2017-11-11: 50 ug/kg/min via INTRAVENOUS
  Administered 2017-11-11: 50.055 ug/kg/min via INTRAVENOUS
  Administered 2017-11-11: 65 ug/kg/min via INTRAVENOUS
  Administered 2017-11-11: 50 ug/kg/min via INTRAVENOUS
  Administered 2017-11-11: 65 ug/kg/min via INTRAVENOUS
  Administered 2017-11-11 – 2017-11-12 (×3): 50 ug/kg/min via INTRAVENOUS
  Administered 2017-11-12: 24.936 ug/kg/min via INTRAVENOUS
  Administered 2017-11-12: 25 ug/kg/min via INTRAVENOUS
  Administered 2017-11-12 – 2017-11-13 (×2): 50 ug/kg/min via INTRAVENOUS
  Filled 2017-11-04: qty 200
  Filled 2017-11-04 (×14): qty 100
  Filled 2017-11-04: qty 200
  Filled 2017-11-04 (×7): qty 100
  Filled 2017-11-04: qty 200
  Filled 2017-11-04 (×2): qty 100
  Filled 2017-11-04: qty 200
  Filled 2017-11-04 (×4): qty 100
  Filled 2017-11-04: qty 200
  Filled 2017-11-04 (×2): qty 100
  Filled 2017-11-04: qty 200
  Filled 2017-11-04 (×15): qty 100
  Filled 2017-11-04: qty 200
  Filled 2017-11-04 (×3): qty 100

## 2017-11-04 MED ORDER — NITROGLYCERIN IN D5W 200-5 MCG/ML-% IV SOLN
0.0000 ug/min | INTRAVENOUS | Status: DC
  Administered 2017-11-04: 5 ug/min via INTRAVENOUS

## 2017-11-04 MED ORDER — NITROGLYCERIN IN D5W 200-5 MCG/ML-% IV SOLN
INTRAVENOUS | Status: AC
  Filled 2017-11-04: qty 250

## 2017-11-04 MED ORDER — PROPOFOL 1000 MG/100ML IV EMUL: 0.0000 ug/kg/min | INTRAVENOUS | Status: DC

## 2017-11-04 NOTE — ED Notes (Signed)
Verbal order rcvd to give Lasix 40mg  IV x 1 dose while lying flat, verbal order for 2mg  IV Ativan, verbal order to insert foley catheter

## 2017-11-04 NOTE — Progress Notes (Signed)
Inpatient Diabetes Program Recommendations  AACE/ADA: New Consensus Statement on Inpatient Glycemic Control (2015)  Target Ranges:  Prepandial:   less than 140 mg/dL      Peak postprandial:   less than 180 mg/dL (1-2 hours)      Critically ill patients:  140 - 180 mg/dL   Lab Results  Component Value Date   GLUCAP 466 (H) 11/04/2017   HGBA1C 11.7 (H) 11/05/2016    Review of Glycemic Control  Results for JALILA, GOOSBY (MRN [id number]) as of 11/04/2017 08:41  Ref. Range 11/04/2017 02:30 11/04/2017 07:41  Glucose-Capillary Latest Ref Range: 65 - 99 mg/dL 350 (H) 093 (H)    Diabetes history: Type 2  Outpatient Diabetes medications: Novolog 15-21 units tid, Invokana 100mg  qam, Levemir 80 units qhs, Metformin 1000mg  bid  Current orders for Inpatient glycemic control: IV insulin via DKA order set   Inpatient Diabetes Program Recommendations: Continue DKA order set until CO2 and Anion gap have normalized.   Susette Racer, RN, BA, MHA, CDE Diabetes Coordinator Inpatient Diabetes Program  [phone] (Team Pager) [phone] [facility] Office) 11/04/2017 8:54 AM

## 2017-11-04 NOTE — Progress Notes (Signed)
Earlier called by RN that patient was getting tachypenic on BiPAP with respiratory rate of about 60 per minute. One dose of morphine 1 mg IV was given with some relief. Patient received 3 L fluid in the ED as per sepsis protocol. She does have underlying CHF. Patient takes Lasix at home. One dose of Lasix 40 mg IV was given with good diuresis. Patient continues to be tachypenic, discussed with E link, patient was started on IV nitroglycerin and decision was made to intubate. Patient is currently intubated.   Critical time spent 35 minutes

## 2017-11-04 NOTE — Progress Notes (Signed)
Initial Nutrition Assessment  DOCUMENTATION CODES:  Obesity unspecified  INTERVENTION:  If patient unable to be extubated within 36 hrs and DKA has fully resolved, would recommend initiation of TF  Will defer making TF recommendations at this time as her high dose of diprivan makes overfeeding inevitable and rate is likely to change anyway  NUTRITION DIAGNOSIS:  Inadequate oral intake related to inability to eat as evidenced by NPO status.  GOAL:  Provide needs based on ASPEN/SCCM guidelines  MONITOR:  Diet advancement, Vent status, Labs, Weight trends, TF tolerance  REASON FOR ASSESSMENT:  Ventilator    ASSESSMENT:  52 y/o female PMHx Crohns, GERD, Depression/Anxiety, COPD on Home o2, CVA, DM2, PTSD, CHF, OSA, HTN. Presented with abd pain, cough, congestion, emesis, diarrhea for several days. Had been off 02 d/t insurance issues. Found to be Influenza A positive. D/t worsening respiratory failure, intubated 2/12  Pt intubated, sedated on high dose propofol. Unable to elicit any history and no family, friends or other historians present. Unable to obtain any recent intake hx.   She had presented w/ n/v/d. Reasonable to conclude she hadnt been eating as well, however, unable to confirm. She does appears to have had acute weight loss. She was 212 at outpatient infusion 2 weeks ago, now about 200 lbs.   Per chart, her UBW for the past couple years appears to be 205-215 lbs. Has had acute periods outside this range, She has HF and on diuretics.   Patient is currently intubated on ventilator support MV: 16.5 L/min Temp (24hrs), Avg:100.4 F (38 C), Min:99.2 F (37.3 C), Max:102.6 F (39.2 C) Propofol: 30 ml/hr = 792 kcals  Physical Exam is WDL, No discernible muscle/fat wasting. Abdomen is soft.   Labs: BGs: >400, BUN/Creat: 38/1.23, TG 328, WBC:11.4 Meds: Vit D, Lasix, Metformin, methylprednisolone, PPI, KCL, Tamiflu,  IV abx, Propofol, IVF, insulin gtt  Recent Labs  Lab  11/04/17 0433 11/04/17 0851 11/04/17 1219  NA 135 135 135  K 4.5 4.4 3.8  CL 102 102 104  CO2 15* 15* 16*  BUN 27* 33* 38*  CREATININE 0.95 1.23* 1.23*  CALCIUM 8.2* 8.4* 8.4*  GLUCOSE 453* 482* 403*   NUTRITION - FOCUSED PHYSICAL EXAM: WDL  Diet Order:  Diet NPO time specified  EDUCATION NEEDS:  No education needs have been identified at this time  Skin:  Skin Assessment: Reviewed RN Assessment  Last BM:  Unknown  Height:  Ht Readings from Last 1 Encounters:  11/04/17 5\' 4"  (1.626 m)   Weight:  Wt Readings from Last 1 Encounters:  11/04/17 200 lb 6.4 oz (90.9 kg)   Wt Readings from Last 10 Encounters:  11/04/17 200 lb 6.4 oz (90.9 kg)  10/21/17 212 lb (96.2 kg)  10/13/17 215 lb (97.5 kg)  10/09/17 212 lb (96.2 kg)  09/20/17 197 lb (89.4 kg)  08/27/17 212 lb (96.2 kg)  08/26/17 210 lb (95.3 kg)  07/24/17 207 lb (93.9 kg)  07/08/17 207 lb (93.9 kg)  06/25/17 214 lb (97.1 kg)   Ideal Body Weight:  54.54 kg  BMI:  Body mass index is 34.4 kg/m.  Estimated Nutritional Needs:  Kcal:  1000-1270 kcals (11-14 kcal/kg bw) Protein:  >109 g Pro (2g/kg ibw) Fluid:  Per MD goals  Christophe Louis RD, LDN, CNSC Clinical Nutrition Pager: [phone] 11/04/2017 3:34 PM

## 2017-11-04 NOTE — ED Notes (Signed)
T/c with Hospitalist, [place] request for him to come to ED to look at pt

## 2017-11-04 NOTE — Progress Notes (Signed)
Pharmacy Note:  Initial antibiotic(s) regimen of Vancomycin and Cefepime ordered by EDP to treat Sepsis.  Estimated Creatinine Clearance: 76.5 mL/min (by C-G formula based on SCr of 0.95 mg/dL).   Allergies  Allergen Reactions  . Flexeril [Cyclobenzaprine] Hives  . Amoxicillin Hives and Rash    Has patient had a PCN reaction causing immediate rash, facial/tongue/throat swelling, SOB or lightheadedness with hypotension: Yes Has patient had a PCN reaction causing severe rash involving mucus membranes or skin necrosis: Yes Has patient had a PCN reaction that required hospitalization No Has patient had a PCN reaction occurring within the last 10 years: Yes If all of the above answers are "NO", then may proceed with Cephalosporin use.    Vitals:   11/04/17 0600 11/04/17 0741  BP: 95/80   Pulse: (!) 111 (!) 111  Resp: (!) 57 (!) 50  Temp:  99.2 F (37.3 C)  SpO2: 97% 93%   Anti-infectives (From admission, onward)   Start     Dose/Rate Route Frequency Ordered Stop   11/04/17 1800  levofloxacin (LEVAQUIN) IVPB 750 mg     750 mg 100 mL/hr over 90 Minutes Intravenous Every 24 hours 11-05-2017 2041     11/04/17 1000  ceFEPIme (MAXIPIME) 1 g in sodium chloride 0.9 % 100 mL IVPB     1 g 200 mL/hr over 30 Minutes Intravenous Every 8 hours 11/04/17 0814     11/04/17 0500  vancomycin (VANCOCIN) 1,250 mg in sodium chloride 0.9 % 250 mL IVPB     1,250 mg 166.7 mL/hr over 90 Minutes Intravenous Every 12 hours 05-Nov-2017 2041     05-Nov-2017 2345  oseltamivir (TAMIFLU) capsule 75 mg     75 mg Oral 2 times daily 11-05-2017 2336 11/08/17 2159   11/05/17 2000  oseltamivir (TAMIFLU) capsule 75 mg     75 mg Oral  Once 11-05-17 1952 11/05/2017 2010   November 05, 2017 1800  vancomycin (VANCOCIN) IVPB 1000 mg/200 mL premix     1,000 mg 200 mL/hr over 60 Minutes Intravenous  Once 11-05-17 1755 11/05/17 1957   2017-11-05 1800  levofloxacin (LEVAQUIN) IVPB 750 mg     750 mg 100 mL/hr over 90 Minutes Intravenous  Once  11-05-2017 1755 November 05, 2017 1957     Antimicrobials this admission:  Vanc 2/11 >>  Levaquin 2/11 >>  Tamiflu 2/11 Cefepime 2/12 >>  Dose adjustments this admission:  n/a   Microbiology results:  2/11 BCx:   UCx:    Sputum:    MRSA PCR:    Plan: Vancomycin 1250mg  IV every 12 hours. Levaquin 750mg  IV every 24 hours. Cefepime 1gm IV q8h Monitor labs, micro and vitals.   Wayland Denis, [facility] 11/04/2017 8:20 AM

## 2017-11-04 NOTE — Consult Note (Addendum)
Consult requested GU:[name] hospitalists Consult requested for: Respiratory failure  HPI: This is a 52 year old who is known to have COPD at home and who is on home oxygen.  She started having increasing shortness of breath several days ago then about 4 days ago started having cough with yellow sputum.  She said initially that she did not have fever chills chest pain palpitations any bleeding.  She did have nausea vomiting and diarrhea.  She had run out of her breathing treatments at home.  She was positive for influenza.  Initially had coarse interstitial markings on chest x-ray.  She continued to be short of breath and having air hunger which culminated in her being intubated and placed on mechanical ventilation at about 3:00 this morning.  She was given Lasix she had been given morphine.  She had 3 L of fluid given in the emergency department as part of sepsis protocol.  She was started on a nitroglycerin drip.  History is from the medical record as there is no family available and she is intubated.  This morning when I see her she is clammy and has respiratory rate in the 30s. Past Medical History:  Diagnosis Date  . Anxiety   . Arthritis   . Cardiac pacemaker in situ 2009   DDD [employer] -- ALTRUNA 60  . Complication of anesthesia   . COPD with asthma ([place])    GOLD 2-3 --  pulmologist (last visit 2011) Dr. Chase Caller  . Crohn's disease ([place])    Large intestine  . Depression 2016   PTSD  . Essential hypertension   . Family history of adverse reaction to anesthesia    father- stop breathing surgery   . Gastroparesis   . GERD (gastroesophageal reflux disease)   . History of hiatal hernia   . History of kidney stones   . History of stroke    Jun 2011 -- right hand weakness  . History of syncope   . Inappropriate sinus tachycardia    Sinus node modification 02-25-2003 by Dr. Cristopher Peru  . LLQ abdominal tenderness 10/20/2015  . Neuromuscular disorder (HCC)    neuropathy   .  OSA (obstructive sleep apnea)    Study done 2005 -- pt refused CPAP/previously was using nocturnal oxygen until one year ago pt states PCP is monitoring pt without  . Pelvic pain in female   . PONV (postoperative nausea and vomiting)   . Presence of permanent cardiac pacemaker   . PTSD (post-traumatic stress disorder)   . Sinus node dysfunction (HCC)    Symptomatic bradycardia  . Stroke [facility]) 2014   left leg weakness- uses leg brace   . Type 2 diabetes mellitus ([place])   . Wears glasses      Family History  Problem Relation Age of Onset  . Diabetes Mother   . Asthma Mother   . Heart disease Mother   . Cirrhosis Mother   . Stroke Mother   . Depression Mother   . Heart disease Father        Deceased. MI. Mother, 2 brothers, sister, nephew also have heart disease  . Emphysema Father        Died of it. Was pt of Dr. Melvyn Novas   . Heart attack Father   . Cancer Father        ? type  . Diabetes Brother   . Heart disease Brother   . Diabetes Sister   . Cirrhosis Sister   . Cirrhosis Maternal Grandmother   .  Diabetes Brother   . Heart attack Brother   . Heart disease Brother   . Seizures Brother   . Depression Brother   . Colon cancer Neg Hx   . Stomach cancer Neg Hx   . Esophageal cancer Neg Hx   . Rectal cancer Neg Hx   . Liver cancer Neg Hx      Social History   Socioeconomic History  . Marital status: Divorced    Spouse name: None  . Number of children: 2  . Years of education: None  . Highest education level: None  Social Needs  . Financial resource strain: None  . Food insecurity - worry: None  . Food insecurity - inability: None  . Transportation needs - medical: None  . Transportation needs - non-medical: None  Occupational History  . Occupation: disabled  Tobacco Use  . Smoking status: Current Every Day Smoker    Packs/day: 2.00    Years: 42.00    Pack years: 84.00    Types: Cigarettes    Start date: 02/02/1973  . Smokeless tobacco: Never Used  . Tobacco  comment: smoke about 2 packs because I recently lost my mom.  Substance and Sexual Activity  . Alcohol use: No    Alcohol/week: 0.0 oz  . Drug use: No    Comment: per 10/27/2016  pt no  . Sexual activity: Not Currently    Birth control/protection: Post-menopausal  Other Topics Concern  . None  Social History Narrative   Married, 2 boys. 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      ROS: Unable to obtain    Objective: Vital signs in last 24 hours: Temp:  [99.2 F (37.3 C)-102.6 F (39.2 C)] 99.2 F (37.3 C) (02/12 0741) Pulse Rate:  [85-145] 111 (02/12 0741) Resp:  [21-68] 50 (02/12 0741) BP: (95-171)/(69-124) 95/80 (02/12 0600) SpO2:  [79 %-97 %] 93 % (02/12 0741) FiO2 (%):  [50 %-100 %] 60 % (02/12 0343) Weight:  [90.9 kg (200 lb 6.4 oz)-96.2 kg (212 lb)] 90.9 kg (200 lb 6.4 oz) (02/12 0200) Weight change:     Intake/Output from previous day: 02/11 0701 - 02/12 0700 In: 3350 [IV Piggyback:3350] Out: 1100 [Urine:1100]  PHYSICAL EXAM Constitutional: She is intubated on mechanical ventilation still with high respiratory rate and her skin feels somewhat clammy.  She is obese.  Eyes: EOMI.  Ears nose mouth and throat: She has endotracheal tube and orogastric tube in place.  Cardiovascular: Her heart is regular with distant heart sounds.  Respiratory: She has increased respiratory rate and has diminished breath sounds bilaterally.  Gastrointestinal: Her abdomen is soft with no masses.  Skin: As mentioned she is moist.  Musculoskeletal: Cannot assess.  Neurological: Cannot assess.  Psychiatric: Cannot assess  Lab Results: Basic Metabolic Panel: Recent Labs    11/02/2017 1752 11/04/17 0433  NA 135 135  K 4.2 4.5  CL 100* 102  CO2 19* 15*  GLUCOSE 395* 453*  BUN 29* 27*  CREATININE 1.02* 0.95  CALCIUM 8.7* 8.2*   Liver Function Tests: Recent Labs    11/13/2017 1752 11/04/17 0433  AST 31 42*  ALT 27 25  ALKPHOS 68 67  BILITOT 0.9 1.2  PROT 7.4  7.3  ALBUMIN 3.1* 3.0*   Recent Labs    11/09/2017 1806  LIPASE 19   No results for input(s): AMMONIA in the last 72 hours. CBC: Recent Labs    11/15/2017 1752 11/04/17 0433  WBC 12.6* 11.4*  NEUTROABS 9.4*  --   HGB 16.1* 15.6*  HCT 48.1* 47.8*  MCV 92.9 95.0  PLT 123* 118*   Cardiac Enzymes: Recent Labs    11/08/2017 1752  TROPONINI <0.03   BNP: No results for input(s): PROBNP in the last 72 hours. D-Dimer: No results for input(s): DDIMER in the last 72 hours. CBG: Recent Labs    11/04/17 0230  GLUCAP 366*   Hemoglobin A1C: No results for input(s): HGBA1C in the last 72 hours. Fasting Lipid Panel: Recent Labs    11/04/17 0433  TRIG 328*   Thyroid Function Tests: No results for input(s): TSH, T4TOTAL, FREET4, T3FREE, THYROIDAB in the last 72 hours. Anemia Panel: No results for input(s): VITAMINB12, FOLATE, FERRITIN, TIBC, IRON, RETICCTPCT in the last 72 hours. Coagulation: No results for input(s): LABPROT, INR in the last 72 hours. Urine Drug Screen: Drugs of Abuse  No results found for: LABOPIA, COCAINSCRNUR, LABBENZ, AMPHETMU, THCU, LABBARB  Alcohol Level: No results for input(s): ETH in the last 72 hours. Urinalysis: Recent Labs    11/18/2017 2130  COLORURINE YELLOW  LABSPEC 1.022  PHURINE 6.0  GLUCOSEU >=500*  HGBUR SMALL*  BILIRUBINUR NEGATIVE  KETONESUR 20*  PROTEINUR 100*  NITRITE NEGATIVE  LEUKOCYTESUR NEGATIVE   Misc. Labs:   ABGS: Recent Labs    11/04/17 0219  PHART 7.361  PO2ART 62.6*  HCO3 19.7*     MICROBIOLOGY: Recent Results (from the past 240 hour(s))  Blood Culture (routine x 2)     Status: None (Preliminary result)   Collection Time: 11/08/2017  6:27 PM  Result Value Ref Range Status   Specimen Description RIGHT ANTECUBITAL  Final   Special Requests   Final    BOTTLES DRAWN AEROBIC AND ANAEROBIC Blood Culture adequate volume   Culture   Final    NO GROWTH < 12 HOURS Performed at [facility], [address]  [place], [place] [address]    Report Status PENDING  Incomplete  Blood Culture (routine x 2)     Status: None (Preliminary result)   Collection Time: 11/02/2017  6:27 PM  Result Value Ref Range Status   Specimen Description RIGHT ANTECUBITAL  Final   Special Requests   Final    BOTTLES DRAWN AEROBIC AND ANAEROBIC Blood Culture adequate volume   Culture   Final    NO GROWTH < 12 HOURS Performed at [facility], [address] [place], [place] [address]    Report Status PENDING  Incomplete    Studies/Results: Dg Chest 1v Repeat Same Day  Result Date: 11/04/2017 CLINICAL DATA:  Orogastric tube placement. EXAM: CHEST - 1 VIEW SAME DAY COMPARISON:  Chest radiograph performed earlier today at 3:20 a.m. FINDINGS: The patient's endotracheal tube is seen ending 5 cm above the carina. An enteric tube is noted extending below the diaphragm. Vascular congestion is again noted. Bilateral airspace opacification is again noted, concerning for pneumonia, though underlying pulmonary edema may be present. No pleural effusion or pneumothorax is seen. The cardiomediastinal silhouette is borderline normal in size. A pacemaker is noted overlying the left chest wall, with leads ending overlying the right atrium and right ventricle. No acute osseous abnormalities are identified. IMPRESSION: 1. Enteric tube noted extending below the diaphragm. 2. Endotracheal tube seen ending 5 cm above the carina. 3. Vascular congestion. Persistent bilateral airspace opacification is concerning for pneumonia, though underlying pulmonary edema may be present. Electronically Signed   By: Garald Balding M.D.   On: 11/04/2017 03:59   Dg Chest  Port 1 View  Result Date: 11/04/2017 CLINICAL DATA:  Endotracheal tube placement. EXAM: PORTABLE CHEST 1 VIEW COMPARISON:  Chest radiograph performed 11/09/2017 FINDINGS: The patient's endotracheal tube is seen ending 2 cm above the carina. Bilateral airspace opacification raises concern for pneumonia.  Underlying pulmonary edema may be present. No pleural effusion or pneumothorax is seen. The cardiomediastinal silhouette is borderline normal in size. A pacemaker is noted overlying the left chest wall, with leads ending overlying the right atrium and right ventricle. No acute osseous abnormalities are seen. IMPRESSION: 1. Endotracheal tube seen ending 2 cm above the carina. 2. Bilateral airspace opacification raises concern for pneumonia. Underlying pulmonary edema may be present. Electronically Signed   By: Garald Balding M.D.   On: 11/04/2017 03:36   Dg Chest Port 1 View  Result Date: 11/12/2017 CLINICAL DATA:  52 year old female with a history of abdominal pain and cough EXAM: PORTABLE CHEST 1 VIEW COMPARISON:  09/21/2017 FINDINGS: Cardiomediastinal silhouette unchanged in size and contour. Fullness in the central vasculature. Coarsened interstitial markings bilaterally. No pleural effusion. No pneumothorax. Cardiac pacing device on left chest wall. IMPRESSION: Coarsened interstitial markings, may reflect atypical infection or developing edema. Cardiac pacing device on the left chest wall. Electronically Signed   By: Corrie Mckusick D.O.   On: 11/13/2017 18:29    Medications:  Prior to Admission:  Medications Prior to Admission  Medication Sig Dispense Refill Last Dose  . acetaminophen (TYLENOL) 500 MG tablet Take 500 mg by mouth every 6 (six) hours as needed for mild pain or moderate pain.   11/02/2017 at Unknown time  . aspirin EC 81 MG tablet Take 81 mg by mouth daily.   11/19/2017 at Unknown time  . carvedilol (COREG) 6.25 MG tablet Take 1 tablet (6.25 mg total) by mouth 2 (two) times daily. 180 tablet 3 10/28/2017 at Unknown time  . Cholecalciferol (VITAMIN D3) 5000 units CAPS Take 1 capsule (5,000 Units total) by mouth daily. 90 capsule 0 11/12/2017 at Unknown time  . clopidogrel (PLAVIX) 75 MG tablet Take 75 mg by mouth daily.   11/04/2017 at Unknown time  . diclofenac sodium (VOLTAREN) 1 % GEL  Apply 4 g topically 4 (four) times daily. (Patient taking differently: Apply 2-4 g topically daily as needed (for leg pain). ) 200 g 11 Past Month at Unknown time  . dicyclomine (BENTYL) 20 MG tablet Take 1 tablet (20 mg total) by mouth every 8 (eight) hours as needed for spasms. 90 tablet 3 11/11/2017 at Unknown time  . diphenoxylate-atropine (LOMOTIL) 2.5-0.025 MG tablet Take 2 tabs by mouth x 1, then 1 tab every 4 hrs PRN, max of 8 tabs in 24 hour time 240 tablet 1 Past Week at Unknown time  . DULoxetine (CYMBALTA) 30 MG capsule Take 90 mg daily (60 mg + 30 mg) (Patient taking differently: Take 30 mg by mouth daily. ) 30 capsule 0 11/20/2017 at Unknown time  . DULoxetine (CYMBALTA) 60 MG capsule Take 90 mg daily (60 mg + 30 mg) 30 capsule 0 11/01/2017 at Unknown time  . furosemide (LASIX) 40 MG tablet TAKE (1) TABLET BY MOUTH EACH MORNING. 90 tablet 3 11/20/2017 at Unknown time  . ibuprofen (ADVIL,MOTRIN) 600 MG tablet Take 600 mg by mouth 2 (two) times daily as needed for moderate pain.    11/08/2017 at Unknown time  . insulin aspart (NOVOLOG FLEXPEN) 100 UNIT/ML FlexPen Inject 15-21 Units into the skin 3 (three) times daily with meals. 5 pen 2 11/05/2017 at Unknown  time  . INVOKANA 100 MG TABS tablet TAKE 1 TABLET BY MOUTH DAILY BEFORE BREAKFAST. 30 tablet 2 11/17/2017 at Unknown time  . LEVEMIR FLEXTOUCH 100 UNIT/ML Pen INJECT 80 UNITS UNDER THE SKIN EVERY DAY AT 10 PM 30 mL 2 11/02/2017 at Unknown time  . lisinopril (PRINIVIL,ZESTRIL) 5 MG tablet Take 5 mg by mouth daily.   11/15/2017 at Unknown time  . LORazepam (ATIVAN) 0.5 MG tablet Take 1 tablet (0.5 mg total) by mouth daily as needed for anxiety. 30 tablet 0 Past Week at Unknown time  . LYRICA 150 MG capsule take 155m by mouth three times daily  0 11/09/2017 at Unknown time  . meloxicam (MOBIC) 15 MG tablet Take 15 mg by mouth daily.   11/04/2017 at Unknown time  . metFORMIN (GLUCOPHAGE) 1000 MG tablet TAKE 1 TABLET(1000 MG) BY MOUTH TWICE DAILY WITH  A MEAL 60 tablet 3 10/30/2017 at Unknown time  . nitroGLYCERIN (NITROSTAT) 0.4 MG SL tablet Place 1 tablet (0.4 mg total) under the tongue every 5 (five) minutes as needed for chest pain. 30 tablet 0 unknown  . pantoprazole (PROTONIX) 40 MG tablet Take 1 tablet (40 mg total) by mouth 2 (two) times daily. 60 tablet 0 11/18/2017 at Unknown time  . potassium chloride (K-DUR) 10 MEQ tablet Take 1 tablet (10 mEq total) by mouth daily. 90 tablet 3 11/15/2017 at Unknown time  . pramipexole (MIRAPEX) 0.25 MG tablet Take 0.25 mg by mouth 2 (two) times daily.    11/19/2017 at Unknown time  . simvastatin (ZOCOR) 10 MG tablet TAKE (1) TABLET BY MOUTH DAILY. (Patient taking differently: TAKE (2) TABLET BY MOUTH DAILY.) 90 tablet 2 11/09/2017 at Unknown time  . tapentadol (NUCYNTA ER) 100 MG 12 hr tablet Take 100 mg by mouth every 12 (twelve) hours.   11/02/2017 at Unknown time  . tiZANidine (ZANAFLEX) 4 MG capsule Take 4 mg by mouth 3 (three) times daily.   10/30/2017 at Unknown time  . albuterol (PROVENTIL) (2.5 MG/3ML) 0.083% nebulizer solution Take 3 mLs (2.5 mg total) by nebulization every 6 (six) hours as needed for wheezing or shortness of breath. 75 mL 12 unknown  . colestipol (COLESTID) 1 g tablet Take 1 tablet (1 g total) by mouth 2 (two) times daily. (Patient not taking: Reported on 10/29/2017) 90 tablet 3 unknown  . inFLIXimab 500 mg in sodium chloride 0.9 % 200 mL Inject 500 mg into the vein every 8 (eight) weeks. 1 Dose 6 Taking  . OXYGEN Inhale 3 L/day into the lungs at bedtime.   Taking   Scheduled: . aspirin EC  81 mg Oral Daily  . carvedilol  6.25 mg Oral BID  . chlorhexidine gluconate (MEDLINE KIT)  15 mL Mouth Rinse BID  . cholecalciferol  5,000 Units Oral Daily  . clopidogrel  75 mg Oral Daily  . colestipol  1 g Oral BID  . DULoxetine  30 mg Oral Daily  . DULoxetine  60 mg Oral Daily  . enoxaparin (LOVENOX) injection  40 mg Subcutaneous Q24H  . furosemide  40 mg Intravenous Q12H  . furosemide   40 mg Oral Daily  . gemfibrozil  600 mg Oral BID AC  . insulin aspart  0-5 Units Subcutaneous QHS  . insulin aspart  0-9 Units Subcutaneous TID WC  . insulin detemir  80 Units Subcutaneous Q2200  . levalbuterol  0.63 mg Nebulization Q6H  . losartan  25 mg Oral Daily  . mouth rinse  15 mL Mouth Rinse  QID  . metFORMIN  1,000 mg Oral BID WC  . methylPREDNISolone (SOLU-MEDROL) injection  80 mg Intravenous Q8H  . mometasone-formoterol  2 puff Inhalation BID  . oseltamivir  75 mg Oral BID  . pantoprazole  40 mg Oral BID  . potassium chloride  10 mEq Oral Daily  . pramipexole  0.25 mg Oral BID  . pregabalin  150 mg Oral TID  . simvastatin  10 mg Oral q1800  . sodium chloride flush  3 mL Intravenous Q12H  . tapentadol  100 mg Oral Q12H  . tiotropium  18 mcg Inhalation Daily  . tiZANidine  4 mg Oral TID   Continuous: . sodium chloride    . levofloxacin (LEVAQUIN) IV    . nitroGLYCERIN 5 mcg/min (11/04/17 0804)  . propofol (DIPRIVAN) infusion 55 mcg/kg/min (11/04/17 6196)  . vancomycin Stopped (11/04/17 0531)   LGK:BOQUCL chloride, acetaminophen **OR** acetaminophen, dicyclomine, levalbuterol, LORazepam, sodium chloride flush  Assesment: She was admitted with COPD exacerbation and influenza A.  She looks like she has diffuse bilateral pneumonia on chest x-ray from this morning I personally reviewed.  She is still having air hunger increased respiratory rate.  Her blood pressure is marginal.  She is on fairly high dose of nitroglycerin which I think we can probably taper now.  She has responded to Lasix but is still positive about 2 L since admission.  The findings on chest x-ray could be from pulmonary edema but I think it looks much more like pneumonia.  She is known to have congestive heart failure at baseline.  Her heart rhythm is mostly paced now. I think she is septic. Active Problems:   COPD exacerbation ([place])   Influenza A    Plan: See if we can reduce her nitroglycerin and perhaps  increase propofol to get her more comfortable.  I have increased her ventilator rate.  I think we need to broaden her antibiotic spectrum.  Add moderate dose steroids    LOS: 1 day   Munirah Doerner L 11/04/2017, 7:54 AM

## 2017-11-04 NOTE — Progress Notes (Signed)
eLink Physician-Brief Progress Note Patient Name: [name] DOB: Aug 10, 1966 MRN: [id number]   Date of Service  11/04/2017  HPI/Events of Note  Request to video into patient room to assess patient's respiratory status. Patient is working extremely hard to breath even on BiPAP. RR = 63.  eICU Interventions  My recommendation is to intubate the patient and mechanically ventilate the patient. She can't tolerate this high respiratory rate much longer.      Intervention Category Major Interventions: Respiratory failure - evaluation and management  Reha Martinovich Eugene 11/04/2017, 1:56 AM

## 2017-11-04 NOTE — Progress Notes (Signed)
eLink Physician-Brief Progress Note Patient Name: [name] DOB: [birth year] MRN: [id number]   Date of Service  11/04/2017  HPI/Events of Note  Respiratory failure - Patient now intubated and ventilated. Request to renew Propofol IV infusion order.   eICU Interventions  Will order: 1. Propofol IV infusion. Titrate to RASS = 0 to -1.     Intervention Category Major Interventions: Respiratory failure - evaluation and management  Sommer,Steven Eugene 11/04/2017, 6:03 AM

## 2017-11-04 NOTE — Consult Note (Signed)
Cardiology Consultation:   Patient ID: [name]; [id number]; Jun 07, 1966   Admit date: 11/07/2017 Date of Consult: 11/04/2017  Primary Care Provider: [facility] [place] Primary Cardiologist: Dr Johnny Bridge Primary Electrophysiologist:  Dr Crissie Sickles   Patient Profile:   [name] is a 52 y.o. female with a hx of sinus node dysfunction with pacemaker, COPD, Crohns who is being seen today for the evaluation of SOB at the request of Dr Dyann Kief.  History of Present Illness:   [name] 52 yo female history of sinus node dysfunction with pacemaker, COPD, Crohns, DM2, admitted with adbomdinal pain. Patient is intubated and history is collected per chart. In ER found to be significant hypoxic to 80s, influenza +. Respiratory status deteriorated overnight requiring intubation. Cardiology is consulted as her echo this admit shows acute drop in LVEF to 30% and she has evidence of fluid overload.   Past Medical History:  Diagnosis Date  . Anxiety   . Arthritis   . Cardiac pacemaker in situ 2009   DDD [employer] -- [place] 60  . Complication of anesthesia   . COPD with asthma ([place])    GOLD 2-3 --  pulmologist (last visit 2011) Dr. Chase Caller  . Crohn's disease ([place])    Large intestine  . Depression 2016   PTSD  . Essential hypertension   . Family history of adverse reaction to anesthesia    father- stop breathing surgery   . Gastroparesis   . GERD (gastroesophageal reflux disease)   . History of hiatal hernia   . History of kidney stones   . History of stroke    Jun 2011 -- right hand weakness  . History of syncope   . Inappropriate sinus tachycardia    Sinus node modification 02-25-2003 by Dr. Cristopher Peru  . LLQ abdominal tenderness 10/20/2015  . Neuromuscular disorder (HCC)    neuropathy   . OSA (obstructive sleep apnea)    Study done 2005 -- pt refused CPAP/previously was using nocturnal oxygen until one year ago pt states PCP is monitoring pt  without  . Pelvic pain in female   . PONV (postoperative nausea and vomiting)   . Presence of permanent cardiac pacemaker   . PTSD (post-traumatic stress disorder)   . Sinus node dysfunction (HCC)    Symptomatic bradycardia  . Stroke [facility]) 2014   left leg weakness- uses leg brace   . Type 2 diabetes mellitus ([place])   . Wears glasses     Past Surgical History:  Procedure Laterality Date  . [place] STUDY N/A 12/30/2016   Procedure: [place] STUDY;  Surgeon: Manus Gunning, MD;  Location: [facility];  Service: Gastroenterology;  Laterality: N/A;  . ABDOMINAL HYSTERECTOMY    . BILATERAL KNEE ARTHROSCOPY W/ CHONDROMALACIA PATELLA  bilateral ---- 12-26-2010; 08-14-2009;  04-25-2008;  03-09-2007   additional same surgery, Left knee 2005;  x2 2006 ---  Right knee 2005;  2006;  x2  2007  . CARDIAC CATHETERIZATION  05-08-2001  dr Shelva Majestic   normal coronary arteries and LVF  . CARDIAC ELECTROPHYSIOLOGY STUDY W/  SINUS NODE MODIFICATION  02-25-2003  dr Carleene Overlie taylor  . CARDIAC PACEMAKER PLACEMENT  10-16-2007  dr Cristopher Peru   DDD-- [facility] 60  . CARDIOVASCULAR STRESS TEST  08-02-2010  dr Domenic Polite   normal lexiscan study/  ef 59%  . CESAREAN SECTION  x2  . CHOLECYSTECTOMY  1994  . COLONOSCOPY N/A 05/23/2017   Procedure:  COLONOSCOPY;  Surgeon: Yetta Flock, MD;  Location: [facility];  Service: Gastroenterology;  Laterality: N/A;  . COLONOSCOPY WITH PROPOFOL N/A 09/27/2016   Procedure: COLONOSCOPY WITH PROPOFOL;  Surgeon: Manus Gunning, MD;  Location: [facility];  Service: Gastroenterology;  Laterality: N/A;  . CYSTO/  URETEROSCOPIC STONE EXTRACTION  03/ 2005  . CYSTOSCOPY WITH HYDRODISTENSION AND BIOPSY N/A 05/23/2015   Procedure: CYSTOSCOPY/BIOPSY/HYDRODISTENSION;  Surgeon: Lowella Bandy, MD;  Location: [facility];  Service: Urology;  Laterality: N/A;  . ESOPHAGEAL MANOMETRY N/A 12/30/2016   Procedure: ESOPHAGEAL MANOMETRY (EM);   Surgeon: Manus Gunning, MD;  Location: [facility];  Service: Gastroenterology;  Laterality: N/A;  . ESOPHAGOGASTRODUODENOSCOPY (EGD) WITH PROPOFOL N/A 09/27/2016   Procedure: ESOPHAGOGASTRODUODENOSCOPY (EGD) WITH PROPOFOL;  Surgeon: Manus Gunning, MD;  Location: [facility];  Service: Gastroenterology;  Laterality: N/A;  . ESOPHAGOGASTRODUODENOSCOPY (EGD) WITH PROPOFOL N/A 05/23/2017   Procedure: ESOPHAGOGASTRODUODENOSCOPY (EGD) WITH PROPOFOL;  Surgeon: Yetta Flock, MD;  Location: [facility];  Service: Gastroenterology;  Laterality: N/A;  . MULTIPLE EXTRACTIONS WITH ALVEOLOPLASTY    . SAVORY DILATION N/A 05/23/2017   Procedure: SAVORY DILATION;  Surgeon: Yetta Flock, MD;  Location: [facility];  Service: Gastroenterology;  Laterality: N/A;  . TONSILLECTOMY  11-25-2002       Inpatient Medications: Scheduled Meds: . aspirin EC  81 mg Oral Daily  . carvedilol  6.25 mg Oral BID  . chlorhexidine gluconate (MEDLINE KIT)  15 mL Mouth Rinse BID  . chlorhexidine gluconate (MEDLINE KIT)  15 mL Mouth Rinse BID  . cholecalciferol  5,000 Units Oral Daily  . clopidogrel  75 mg Oral Daily  . colestipol  1 g Oral BID  . DULoxetine  30 mg Oral Daily  . DULoxetine  60 mg Oral Daily  . enoxaparin (LOVENOX) injection  40 mg Subcutaneous Q24H  . furosemide  40 mg Intravenous Q12H  . furosemide  40 mg Oral Daily  . levalbuterol  0.63 mg Nebulization Q6H  . losartan  25 mg Oral Daily  . mouth rinse  15 mL Mouth Rinse QID  . mouth rinse  15 mL Mouth Rinse QID  . metFORMIN  1,000 mg Oral BID WC  . methylPREDNISolone (SOLU-MEDROL) injection  80 mg Intravenous Q8H  . mometasone-formoterol  2 puff Inhalation BID  . oseltamivir  75 mg Oral BID  . pantoprazole  40 mg Oral BID  . potassium chloride  10 mEq Oral Daily  . pramipexole  0.25 mg Oral BID  . pregabalin  150 mg Oral TID  . simvastatin  10 mg Oral q1800  . sodium chloride flush  3 mL Intravenous Q12H  .  tapentadol  100 mg Oral Q12H  . tiotropium  18 mcg Inhalation Daily  . tiZANidine  4 mg Oral TID   Continuous Infusions: . sodium chloride    . ceFEPime (MAXIPIME) IV Stopped (11/04/17 1210)  . dextrose 5 % and 0.45% NaCl    . insulin (NOVOLIN-R) infusion 17.9 Units/hr (11/04/17 1508)  . levofloxacin (LEVAQUIN) IV    . nitroGLYCERIN Stopped (11/04/17 0830)  . propofol (DIPRIVAN) infusion 55 mcg/kg/min (11/04/17 1527)  . vancomycin Stopped (11/04/17 0531)   PRN Meds: sodium chloride, acetaminophen **OR** acetaminophen, dicyclomine, fentaNYL (SUBLIMAZE) injection, fentaNYL (SUBLIMAZE) injection, levalbuterol, LORazepam, sodium chloride flush  Allergies:    Allergies  Allergen Reactions  . Flexeril [Cyclobenzaprine] Hives  . Amoxicillin Hives and Rash    Has patient had a PCN reaction causing immediate rash, facial/tongue/throat swelling, SOB  or lightheadedness with hypotension: Yes Has patient had a PCN reaction causing severe rash involving mucus membranes or skin necrosis: Yes Has patient had a PCN reaction that required hospitalization No Has patient had a PCN reaction occurring within the last 10 years: Yes If all of the above answers are "NO", then may proceed with Cephalosporin use.     Social History:   Social History   Socioeconomic History  . Marital status: Divorced    Spouse name: Not on file  . Number of children: 2  . Years of education: Not on file  . Highest education level: Not on file  Social Needs  . Financial resource strain: Not on file  . Food insecurity - worry: Not on file  . Food insecurity - inability: Not on file  . Transportation needs - medical: Not on file  . Transportation needs - non-medical: Not on file  Occupational History  . Occupation: disabled  Tobacco Use  . Smoking status: Current Every Day Smoker    Packs/day: 2.00    Years: 42.00    Pack years: 84.00    Types: Cigarettes    Start date: 02/02/1973  . Smokeless tobacco: Never  Used  . Tobacco comment: smoke about 2 packs because I recently lost my mom.  Substance and Sexual Activity  . Alcohol use: No    Alcohol/week: 0.0 oz  . Drug use: No    Comment: per 10/27/2016  pt no  . Sexual activity: Not Currently    Birth control/protection: Post-menopausal  Other Topics Concern  . Not on file  Social History Narrative   Married, 2 boys. 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     Family History:    Family History  Problem Relation Age of Onset  . Diabetes Mother   . Asthma Mother   . Heart disease Mother   . Cirrhosis Mother   . Stroke Mother   . Depression Mother   . Heart disease Father        Deceased. MI. Mother, 2 brothers, sister, nephew also have heart disease  . Emphysema Father        Died of it. Was pt of Dr. Melvyn Novas   . Heart attack Father   . Cancer Father        ? type  . Diabetes Brother   . Heart disease Brother   . Diabetes Sister   . Cirrhosis Sister   . Cirrhosis Maternal Grandmother   . Diabetes Brother   . Heart attack Brother   . Heart disease Brother   . Seizures Brother   . Depression Brother   . Colon cancer Neg Hx   . Stomach cancer Neg Hx   . Esophageal cancer Neg Hx   . Rectal cancer Neg Hx   . Liver cancer Neg Hx      ROS:  Unable to collect patient is intubated and sedated  Physical Exam/Data:   Vitals:   11/04/17 1400 11/04/17 1415 11/04/17 1430 11/04/17 1445  BP: 94/72 (!) 89/77 99/88 99/85   Pulse: (!) 103 98 99 (!) 107  Resp: (!) 31 (!) 28 (!) 26 (!) 33  Temp:      TempSrc:      SpO2: 93% 93% 96% 92%  Weight:      Height:        Intake/Output Summary (Last 24 hours) at 11/04/2017 1611 Last data filed at 11/04/2017 0400 Gross per 24 hour  Intake 3350 ml  Output  1100 ml  Net 2250 ml   Filed Weights   11/01/2017 1735 11/04/17 0200  Weight: 212 lb (96.2 kg) 200 lb 6.4 oz (90.9 kg)   Body mass index is 34.4 kg/m.  General:  sedated HEENT: normal Lymph: no adenopathy Neck: no  JVD Endocrine:  No thryomegaly Cardiac:  normal S1, S2; RRR; no murmur  Lungs:  Coarse bilaterally Abd: soft, nontender, no hepatomegaly  Ext: no edema Musculoskeletal:  No deformities, BUE and BLE strength normal and equal Skin: warm and dry  Neuro:  CNs 2-12 intact, no focal abnormalities noted Psych:  Normal affect    Laboratory Data:  Chemistry Recent Labs  Lab 11/04/17 0433 11/04/17 0851 11/04/17 1219  NA 135 135 135  K 4.5 4.4 3.8  CL 102 102 104  CO2 15* 15* 16*  GLUCOSE 453* 482* 403*  BUN 27* 33* 38*  CREATININE 0.95 1.23* 1.23*  CALCIUM 8.2* 8.4* 8.4*  GFRNONAA >60 50* 50*  GFRAA >60 58* 58*  ANIONGAP 18* 18* 15    Recent Labs  Lab 10/26/2017 1752 11/04/17 0433  PROT 7.4 7.3  ALBUMIN 3.1* 3.0*  AST 31 42*  ALT 27 25  ALKPHOS 68 67  BILITOT 0.9 1.2   Hematology Recent Labs  Lab 10/31/2017 1752 11/04/17 0433  WBC 12.6* 11.4*  RBC 5.18* 5.03  HGB 16.1* 15.6*  HCT 48.1* 47.8*  MCV 92.9 95.0  MCH 31.1 31.0  MCHC 33.5 32.6  RDW 13.9 14.2  PLT 123* 118*   Cardiac Enzymes Recent Labs  Lab 10/27/2017 1752  TROPONINI <0.03   No results for input(s): TROPIPOC in the last 168 hours.  BNP Recent Labs  Lab 11/14/2017 1753  BNP 36.0    DDimer No results for input(s): DDIMER in the last 168 hours.  Radiology/Studies:  Dg Chest 1v Repeat Same Day  Result Date: 11/04/2017 CLINICAL DATA:  Orogastric tube placement. EXAM: CHEST - 1 VIEW SAME DAY COMPARISON:  Chest radiograph performed earlier today at 3:20 a.m. FINDINGS: The patient's endotracheal tube is seen ending 5 cm above the carina. An enteric tube is noted extending below the diaphragm. Vascular congestion is again noted. Bilateral airspace opacification is again noted, concerning for pneumonia, though underlying pulmonary edema may be present. No pleural effusion or pneumothorax is seen. The cardiomediastinal silhouette is borderline normal in size. A pacemaker is noted overlying the left chest wall,  with leads ending overlying the right atrium and right ventricle. No acute osseous abnormalities are identified. IMPRESSION: 1. Enteric tube noted extending below the diaphragm. 2. Endotracheal tube seen ending 5 cm above the carina. 3. Vascular congestion. Persistent bilateral airspace opacification is concerning for pneumonia, though underlying pulmonary edema may be present. Electronically Signed   By: Garald Balding M.D.   On: 11/04/2017 03:59   Dg Chest Port 1 View  Result Date: 11/04/2017 CLINICAL DATA:  Endotracheal tube placement. EXAM: PORTABLE CHEST 1 VIEW COMPARISON:  Chest radiograph performed 10/27/2017 FINDINGS: The patient's endotracheal tube is seen ending 2 cm above the carina. Bilateral airspace opacification raises concern for pneumonia. Underlying pulmonary edema may be present. No pleural effusion or pneumothorax is seen. The cardiomediastinal silhouette is borderline normal in size. A pacemaker is noted overlying the left chest wall, with leads ending overlying the right atrium and right ventricle. No acute osseous abnormalities are seen. IMPRESSION: 1. Endotracheal tube seen ending 2 cm above the carina. 2. Bilateral airspace opacification raises concern for pneumonia. Underlying pulmonary edema may be present. Electronically Signed  By: Garald Balding M.D.   On: 11/04/2017 03:36   Dg Chest Port 1 View  Result Date: 10/31/2017 CLINICAL DATA:  52 year old female with a history of abdominal pain and cough EXAM: PORTABLE CHEST 1 VIEW COMPARISON:  09/21/2017 FINDINGS: Cardiomediastinal silhouette unchanged in size and contour. Fullness in the central vasculature. Coarsened interstitial markings bilaterally. No pleural effusion. No pneumothorax. Cardiac pacing device on left chest wall. IMPRESSION: Coarsened interstitial markings, may reflect atypical infection or developing edema. Cardiac pacing device on the left chest wall. Electronically Signed   By: Corrie Mckusick D.O.   On: 10/31/2017  18:29    Assessment and Plan:   1. Acute systolic heart failure - LVEF down from normal to 30% from just last month, primarily apical WMAs would suggest Takotsubo however this diagnosis requires the exclusion of CAD - suspect stress induced CM in setting of influenza and respiratory failure. ACS less likely. Will cycle EKG and enzymes, once more stable I think she may require a cath to confirm the diagnosis. Check BNP - iniitally hypertensive and started on NG drip, now off.  - agree with diuresis, currently on IV lasix 9m bid. Difficult to assess volume status by exam based on body habitus - would recommend PICC line to monitor CVP, check co-ox,and have available in case vasoactive agents become required in this intubated patient with acute HF and sepsis - with sepsis and acute CHF would wait on beta blocker, ACE/ARB/ARNI, aldactone until we see where her hemodynamics settle, concern about transition to hypotension.  - of note she has been on DAPT for many years, there is no clear cardiac indication. I assume due to her prior CVA. Continue.   2. Influenza - management per primary team.     For questions or updates, please contact CDanvillePlease consult [email or web address] for contact info under Cardiology/STEMI.   SMerrily Pew MD  11/04/2017 4:11 PM

## 2017-11-04 NOTE — ED Notes (Signed)
Pt RR 58, reported to EDP, [place], orders rcvd for 0.5mg  Ativan

## 2017-11-04 NOTE — ED Notes (Signed)
Dr. Lama at bedside. 

## 2017-11-04 NOTE — ED Notes (Signed)
Darrell Alla Feeling (fiance) can be reached at [phone]

## 2017-11-04 NOTE — Progress Notes (Addendum)
*  PRELIMINARY RESULTS* Echocardiogram 2D Echocardiogram has been performed with Definity.  Stacey Drain 11/04/2017, 3:26 PM

## 2017-11-04 NOTE — Progress Notes (Signed)
Dr. Gwenlyn Perking paged about ICU 6 critical Lactic Acid of  2.3.

## 2017-11-04 NOTE — ED Provider Notes (Signed)
After transfer to ICU, patient continued to be very tachypneic and appeared to be tiring in spite of BiPAP, so decision was made to intubate the patient.  Procedure Name: Intubation Date/Time: 11/04/2017 3:05 AM Performed by: Dione Booze, MD Pre-anesthesia Checklist: Patient identified, Patient being monitored, Emergency Drugs available, Timeout performed and Suction available Oxygen Delivery Method: Non-rebreather mask Preoxygenation: Pre-oxygenation with 100% oxygen Induction Type: Rapid sequence Ventilation: Mask ventilation without difficulty Laryngoscope Size: Glidescope and 4 Grade View: Grade I Tube size: 8.0 mm Number of attempts: 1 Airway Equipment and Method: Video-laryngoscopy and Rigid stylet Placement Confirmation: ETT inserted through vocal cords under direct vision,  Breath sounds checked- equal and bilateral and Positive ETCO2 Secured at: 24 cm Tube secured with: ETT holder Dental Injury: Teeth and Oropharynx as per pre-operative assessment  Difficulty Due To: Difficult Airway- due to anterior larynx, Difficult Airway-  due to edematous airway and Difficulty was unanticipated  Post intubation x-ray showed ET tube in the right mainstem bronchus, and tube was withdrawn showing tip of the tube near the carina, and tube was withdrawn further.   Dione Booze, MD 11/04/17 [phone]

## 2017-11-04 NOTE — Progress Notes (Signed)
[facility] PROGRESS NOTE  AILYNN GOW JHE:[id number] DOB: March 02, 1966 DOA: 11/13/2017 PCP: [facility] [place]  Interim summary and HPI 52 y.o. female,  w Copd on home o2, apparently ran out of her breathing treatments and c/o increase in dyspnea. Pt states that her breathing started to become worse on Friday.  + cough w yellow sputum.  Denies fever, chills, cp, palpitations, brbpr.   Pt notes n/v, general malaise and diarrhea over the past 2 days.   In ED, pox on RA was in the mid to high 80's; found to have positive influenza A. CXR also demonstrated coarse interstitial marking and vascular congestion; unable to r/o underlying infiltrates. WBC's 12.6, fever 102.6, RR 25  Of, note patient was also found to be on DKA and 2-De cho demonstrated EF of 30% with concerns for Takotsubo    Assessment/Plan: 1-acute resp failure with hypoxia: in the setting of COPD exacerbation and influenza A. -continue ventilatory support -appreciate assistance by pulmonary/critical care service -continue steroids, Duoneband antibiotics -will add pulmicort BID and atrovent QID -follow clinical response  2-DKA: -in the setting of steroids use and infection -will use DKA protocol and start IV insulin  -follow CBG's and provide very gentle IVF's; as there is concerns for vascular congestion as well. -follow BMET closely  3-acute Cardiomyopathy: possible takotsubo; positive apical wall motion abnormalities and decrease EF; no prior hx of CAD or heart failure. -cardiology consulted -following rec's will get PICC line to check CVP and COX -continue lasix -given low BP now, will avoid the use of B-blocker, ARB/ACEI -daily weights and strict intake and output  4-influenza A -will continue providing supportive care and tamiflu  5-GERD/GI prophylaxis -continue PPI  6-Sepsis: -code sepsis initiated -continue levaquin, cefepime and vancomycin    Code Status: Full Family  Communication: no family at bedside Disposition Plan: remains in ICU, continue ventilatory support, IV broad spectrum antibiotics; will follow cardiology rec's and continue treatment for DKA per protocol.   Consultants:  Cardiology  Pulmonary/critical care  Procedures:  See below for x-ray reports   2-D echo: EF 30%, apical WMA  Antibiotics:  Cefepime, vancomycin and levaquin 2/12  HPI/Subjective: Currently afebrile and sedated, ventilatory support in place. Very diminish air movement.  Objective: Vitals:   11/04/17 1445 11/04/17 1626  BP: 99/85   Pulse: (!) 107 77  Resp: (!) 33 (!) 31  Temp:  99 F (37.2 C)  SpO2: 92% 96%    Intake/Output Summary (Last 24 hours) at 11/04/2017 1652 Last data filed at 11/04/2017 0400 Gross per 24 hour  Intake 3350 ml  Output 1100 ml  Net 2250 ml   Filed Weights   10/27/2017 1735 11/04/17 0200  Weight: 96.2 kg (212 lb) 90.9 kg (200 lb 6.4 oz)    Exam:   General:  Afebrile, under sedation and ventilated.  Cardiovascular: S1 and S2, no rubs or gallops; unable to assess JVD due to body habitus.  Respiratory: very diminish breath sounds, positive tachypnea.  Abdomen: obese, soft, NT, ND, positive BS  Musculoskeletal: trace edema, no cyanosis, no clubbing   Data Reviewed: Basic Metabolic Panel: Recent Labs  Lab 10/31/2017 1752 11/04/17 0433 11/04/17 0851 11/04/17 1219  NA 135 135 135 135  K 4.2 4.5 4.4 3.8  CL 100* 102 102 104  CO2 19* 15* 15* 16*  GLUCOSE 395* 453* 482* 403*  BUN 29* 27* 33* 38*  CREATININE 1.02* 0.95 1.23* 1.23*  CALCIUM 8.7* 8.2* 8.4* 8.4*   Liver Function Tests:  Recent Labs  Lab 11/05/2017 1752 11/04/17 0433  AST 31 42*  ALT 27 25  ALKPHOS 68 67  BILITOT 0.9 1.2  PROT 7.4 7.3  ALBUMIN 3.1* 3.0*   Recent Labs  Lab 11/18/2017 1806  LIPASE 19   CBC: Recent Labs  Lab 11/11/2017 1752 11/04/17 0433  WBC 12.6* 11.4*  NEUTROABS 9.4*  --   HGB 16.1* 15.6*  HCT 48.1* 47.8*  MCV 92.9 95.0   PLT 123* 118*   Cardiac Enzymes: Recent Labs  Lab 10/27/2017 1752  TROPONINI <0.03   BNP (last 3 results) Recent Labs    09/20/17 2346 10/28/2017 1753  BNP 227.0* 36.0    CBG: Recent Labs  Lab 11/04/17 1153 11/04/17 1305 11/04/17 1415 11/04/17 1505 11/04/17 1615  GLUCAP 394* 385* 313* 316* 271*    Recent Results (from the past 240 hour(s))  Blood Culture (routine x 2)     Status: None (Preliminary result)   Collection Time: 11/15/2017  6:27 PM  Result Value Ref Range Status   Specimen Description RIGHT ANTECUBITAL  Final   Special Requests   Final    BOTTLES DRAWN AEROBIC AND ANAEROBIC Blood Culture adequate volume   Culture   Final    NO GROWTH < 12 HOURS Performed at [facility], [address] [place], [place] [address]    Report Status PENDING  Incomplete  Blood Culture (routine x 2)     Status: None (Preliminary result)   Collection Time: 11/09/2017  6:27 PM  Result Value Ref Range Status   Specimen Description RIGHT ANTECUBITAL  Final   Special Requests   Final    BOTTLES DRAWN AEROBIC AND ANAEROBIC Blood Culture adequate volume   Culture   Final    NO GROWTH < 12 HOURS Performed at [facility], [address] [place], [place] [address]    Report Status PENDING  Incomplete  MRSA PCR Screening     Status: None   Collection Time: 11/04/17  1:26 AM  Result Value Ref Range Status   MRSA by PCR NEGATIVE NEGATIVE Final    Comment:        The GeneXpert MRSA Assay (FDA approved for NASAL specimens only), is one component of a comprehensive MRSA colonization surveillance program. It is not intended to diagnose MRSA infection nor to guide or monitor treatment for MRSA infections. Performed at [facility], [address] [place], [place] [address]      Studies: Dg Chest 1v Repeat Same Day  Result Date: 11/04/2017 CLINICAL DATA:  Orogastric tube placement. EXAM: CHEST - 1 VIEW SAME DAY COMPARISON:  Chest radiograph performed earlier today at 3:20 a.m.  FINDINGS: The patient's endotracheal tube is seen ending 5 cm above the carina. An enteric tube is noted extending below the diaphragm. Vascular congestion is again noted. Bilateral airspace opacification is again noted, concerning for pneumonia, though underlying pulmonary edema may be present. No pleural effusion or pneumothorax is seen. The cardiomediastinal silhouette is borderline normal in size. A pacemaker is noted overlying the left chest wall, with leads ending overlying the right atrium and right ventricle. No acute osseous abnormalities are identified. IMPRESSION: 1. Enteric tube noted extending below the diaphragm. 2. Endotracheal tube seen ending 5 cm above the carina. 3. Vascular congestion. Persistent bilateral airspace opacification is concerning for pneumonia, though underlying pulmonary edema may be present. Electronically Signed   By: Garald Balding M.D.   On: 11/04/2017 03:59   Dg Chest Port 1 View  Result Date: 11/04/2017  CLINICAL DATA:  Endotracheal tube placement. EXAM: PORTABLE CHEST 1 VIEW COMPARISON:  Chest radiograph performed 11/17/2017 FINDINGS: The patient's endotracheal tube is seen ending 2 cm above the carina. Bilateral airspace opacification raises concern for pneumonia. Underlying pulmonary edema may be present. No pleural effusion or pneumothorax is seen. The cardiomediastinal silhouette is borderline normal in size. A pacemaker is noted overlying the left chest wall, with leads ending overlying the right atrium and right ventricle. No acute osseous abnormalities are seen. IMPRESSION: 1. Endotracheal tube seen ending 2 cm above the carina. 2. Bilateral airspace opacification raises concern for pneumonia. Underlying pulmonary edema may be present. Electronically Signed   By: Garald Balding M.D.   On: 11/04/2017 03:36   Dg Chest Port 1 View  Result Date: 11/15/2017 CLINICAL DATA:  52 year old female with a history of abdominal pain and cough EXAM: PORTABLE CHEST 1 VIEW  COMPARISON:  09/21/2017 FINDINGS: Cardiomediastinal silhouette unchanged in size and contour. Fullness in the central vasculature. Coarsened interstitial markings bilaterally. No pleural effusion. No pneumothorax. Cardiac pacing device on left chest wall. IMPRESSION: Coarsened interstitial markings, may reflect atypical infection or developing edema. Cardiac pacing device on the left chest wall. Electronically Signed   By: Corrie Mckusick D.O.   On: 11/07/2017 18:29    Scheduled Meds: . aspirin EC  81 mg Oral Daily  . chlorhexidine gluconate (MEDLINE KIT)  15 mL Mouth Rinse BID  . chlorhexidine gluconate (MEDLINE KIT)  15 mL Mouth Rinse BID  . cholecalciferol  5,000 Units Oral Daily  . clopidogrel  75 mg Oral Daily  . colestipol  1 g Oral BID  . DULoxetine  30 mg Oral Daily  . DULoxetine  60 mg Oral Daily  . enoxaparin (LOVENOX) injection  40 mg Subcutaneous Q24H  . furosemide  40 mg Intravenous Q12H  . levalbuterol  0.63 mg Nebulization Q6H  . mouth rinse  15 mL Mouth Rinse QID  . mouth rinse  15 mL Mouth Rinse QID  . metFORMIN  1,000 mg Oral BID WC  . methylPREDNISolone (SOLU-MEDROL) injection  80 mg Intravenous Q8H  . mometasone-formoterol  2 puff Inhalation BID  . oseltamivir  75 mg Oral BID  . pantoprazole  40 mg Oral BID  . potassium chloride  10 mEq Oral Daily  . pramipexole  0.25 mg Oral BID  . pregabalin  150 mg Oral TID  . sodium chloride flush  3 mL Intravenous Q12H  . tapentadol  100 mg Oral Q12H  . tiotropium  18 mcg Inhalation Daily  . tiZANidine  4 mg Oral TID   Continuous Infusions: . sodium chloride    . sodium chloride    . ceFEPime (MAXIPIME) IV Stopped (11/04/17 1210)  . dextrose 5 % and 0.45% NaCl    . insulin (NOVOLIN-R) infusion 16.9 Units/hr (11/04/17 1625)  . levofloxacin (LEVAQUIN) IV    . propofol (DIPRIVAN) infusion 55 mcg/kg/min (11/04/17 1527)  . vancomycin Stopped (11/04/17 0531)    Active Problems:   COPD exacerbation ([place])   Influenza  A    Time spent: 35 minutes    Barton Dubois  [facility] Pager [phone]. If 7PM-7AM, please contact night-coverage at [email or web address], password [facility] 11/04/2017, 4:52 PM  LOS: 1 day

## 2017-11-04 NOTE — ED Notes (Signed)
T/c from hospitalist, Dr. Gerlene Fee pt RR of 68, verbal order rcvd for 1mg  Morphine and repeat if RR not decreased

## 2017-11-04 NOTE — ED Notes (Signed)
Respiratory at bedside, pt RR increased, discussed with pt importance of regulating and slowing breathing

## 2017-11-05 ENCOUNTER — Inpatient Hospital Stay (HOSPITAL_COMMUNITY): Payer: Medicaid Other

## 2017-11-05 DIAGNOSIS — J9602 Acute respiratory failure with hypercapnia: Secondary | ICD-10-CM

## 2017-11-05 DIAGNOSIS — J9601 Acute respiratory failure with hypoxia: Secondary | ICD-10-CM

## 2017-11-05 DIAGNOSIS — E871 Hypo-osmolality and hyponatremia: Secondary | ICD-10-CM | POA: Diagnosis present

## 2017-11-05 DIAGNOSIS — E876 Hypokalemia: Secondary | ICD-10-CM | POA: Diagnosis present

## 2017-11-05 LAB — GLUCOSE, CAPILLARY
GLUCOSE-CAPILLARY: 128 mg/dL — AB (ref 65–99)
GLUCOSE-CAPILLARY: 143 mg/dL — AB (ref 65–99)
GLUCOSE-CAPILLARY: 156 mg/dL — AB (ref 65–99)
GLUCOSE-CAPILLARY: 162 mg/dL — AB (ref 65–99)
GLUCOSE-CAPILLARY: 157 mg/dL — AB (ref 65–99)
GLUCOSE-CAPILLARY: 177 mg/dL — AB (ref 65–99)
GLUCOSE-CAPILLARY: 163 mg/dL — AB (ref 65–99)
GLUCOSE-CAPILLARY: 189 mg/dL — AB (ref 65–99)
GLUCOSE-CAPILLARY: 160 mg/dL — AB (ref 65–99)
Glucose-Capillary: 135 mg/dL — ABNORMAL HIGH (ref 65–99)
Glucose-Capillary: 148 mg/dL — ABNORMAL HIGH (ref 65–99)
Glucose-Capillary: 149 mg/dL — ABNORMAL HIGH (ref 65–99)
Glucose-Capillary: 150 mg/dL — ABNORMAL HIGH (ref 65–99)
Glucose-Capillary: 154 mg/dL — ABNORMAL HIGH (ref 65–99)
Glucose-Capillary: 158 mg/dL — ABNORMAL HIGH (ref 65–99)
Glucose-Capillary: 162 mg/dL — ABNORMAL HIGH (ref 65–99)
Glucose-Capillary: 164 mg/dL — ABNORMAL HIGH (ref 65–99)
Glucose-Capillary: 174 mg/dL — ABNORMAL HIGH (ref 65–99)
Glucose-Capillary: 176 mg/dL — ABNORMAL HIGH (ref 65–99)
Glucose-Capillary: 190 mg/dL — ABNORMAL HIGH (ref 65–99)
Glucose-Capillary: 196 mg/dL — ABNORMAL HIGH (ref 65–99)

## 2017-11-05 LAB — BLOOD GAS, ARTERIAL
ACID-BASE EXCESS: 22.1 mmol/L — AB (ref 0.0–2.0)
BICARBONATE: 22.1 mmol/L (ref 20.0–28.0)
Drawn by: 317771
FIO2: 60
O2 SAT: 96.1 %
PCO2 ART: 29.1 mmHg — AB (ref 32.0–48.0)
PEEP: 5 cmH2O
RATE: 22 resp/min
VT: 500 mL
pH, Arterial: 7.436 (ref 7.350–7.450)
pO2, Arterial: 92.6 mmHg (ref 83.0–108.0)

## 2017-11-05 LAB — TROPONIN I
TROPONIN I: 1.68 ng/mL — AB (ref <0.03)
Troponin I: 1.26 ng/mL (ref <0.03)
Troponin I: 1.18 ng/mL (ref <0.03)
Troponin I: 1.3 ng/mL (ref <0.03)

## 2017-11-05 LAB — COOXEMETRY PANEL
Carboxyhemoglobin: 0.6 % (ref 0.5–1.5)
Methemoglobin: 1.2 % (ref 0.0–1.5)
O2 Saturation: 72.9 %
TOTAL HEMOGLOBIN: 12 g/dL (ref 12.0–16.0)
Total oxygen content: 12.1 mL/dL — ABNORMAL LOW (ref 15.0–23.0)

## 2017-11-05 LAB — BASIC METABOLIC PANEL
Anion gap: 11 (ref 5–15)
Anion gap: 14 (ref 5–15)
BUN: 44 mg/dL — ABNORMAL HIGH (ref 6–20)
BUN: 48 mg/dL — ABNORMAL HIGH (ref 6–20)
CHLORIDE: 106 mmol/L (ref 101–111)
CO2: 19 mmol/L — ABNORMAL LOW (ref 22–32)
CO2: 19 mmol/L — ABNORMAL LOW (ref 22–32)
Calcium: 8.5 mg/dL — ABNORMAL LOW (ref 8.9–10.3)
Calcium: 8.6 mg/dL — ABNORMAL LOW (ref 8.9–10.3)
Chloride: 109 mmol/L (ref 101–111)
Creatinine, Ser: 1.44 mg/dL — ABNORMAL HIGH (ref 0.44–1.00)
Creatinine, Ser: 1.48 mg/dL — ABNORMAL HIGH (ref 0.44–1.00)
GFR calc Af Amer: 46 mL/min — ABNORMAL LOW (ref >60)
GFR calc non Af Amer: 41 mL/min — ABNORMAL LOW (ref >60)
GFR, EST AFRICAN AMERICAN: 48 mL/min — AB (ref >60)
GFR, EST NON AFRICAN AMERICAN: 40 mL/min — AB (ref >60)
Glucose, Bld: 158 mg/dL — ABNORMAL HIGH (ref 65–99)
Glucose, Bld: 168 mg/dL — ABNORMAL HIGH (ref 65–99)
POTASSIUM: 3.5 mmol/L (ref 3.5–5.1)
POTASSIUM: 3.4 mmol/L — AB (ref 3.5–5.1)
SODIUM: 136 mmol/L (ref 135–145)
SODIUM: 142 mmol/L (ref 135–145)

## 2017-11-05 LAB — CREATININE, URINE, RANDOM: CREATININE, URINE: 67.3 mg/dL

## 2017-11-05 LAB — HEPARIN LEVEL (UNFRACTIONATED): Heparin Unfractionated: 0.59 IU/mL (ref 0.30–0.70)

## 2017-11-05 MED ORDER — ORAL CARE MOUTH RINSE
15.0000 mL | OROMUCOSAL | Status: DC
  Administered 2017-11-05 – 2017-11-14 (×76): 15 mL via OROMUCOSAL

## 2017-11-05 MED ORDER — CHLORHEXIDINE GLUCONATE CLOTH 2 % EX PADS
6.0000 | MEDICATED_PAD | Freq: Every day | CUTANEOUS | Status: DC
  Administered 2017-11-05 – 2017-11-24 (×20): 6 via TOPICAL

## 2017-11-05 MED ORDER — CHLORHEXIDINE GLUCONATE 0.12% ORAL RINSE (MEDLINE KIT)
15.0000 mL | Freq: Two times a day (BID) | OROMUCOSAL | Status: DC
  Administered 2017-11-05 – 2017-11-13 (×17): 15 mL via OROMUCOSAL

## 2017-11-05 MED ORDER — SODIUM CHLORIDE 0.9% FLUSH
10.0000 mL | Freq: Two times a day (BID) | INTRAVENOUS | Status: DC
  Administered 2017-11-05 – 2017-11-09 (×8): 10 mL
  Administered 2017-11-09: 40 mL
  Administered 2017-11-10 – 2017-11-15 (×12): 10 mL
  Administered 2017-11-17 (×2): 30 mL
  Administered 2017-11-18 – 2017-11-24 (×6): 10 mL

## 2017-11-05 MED ORDER — HEPARIN (PORCINE) IN NACL 100-0.45 UNIT/ML-% IJ SOLN
1200.0000 [IU]/h | INTRAMUSCULAR | Status: DC
Start: 2017-11-05 — End: 2017-11-07
  Administered 2017-11-05 – 2017-11-06 (×2): 1050 [IU]/h via INTRAVENOUS
  Administered 2017-11-07: 1200 [IU]/h via INTRAVENOUS
  Filled 2017-11-05 (×3): qty 250

## 2017-11-05 MED ORDER — SODIUM CHLORIDE 0.9% FLUSH
10.0000 mL | INTRAVENOUS | Status: DC | PRN
  Administered 2017-11-21: 10 mL
  Filled 2017-11-05: qty 40

## 2017-11-05 MED ORDER — POTASSIUM CHLORIDE CRYS ER 10 MEQ PO TBCR: 10.0000 meq | EXTENDED_RELEASE_TABLET | Freq: Every day | ORAL | Status: DC

## 2017-11-05 MED ORDER — HEPARIN BOLUS VIA INFUSION
2000.0000 [IU] | Freq: Once | INTRAVENOUS | Status: AC
  Administered 2017-11-05: 2000 [IU] via INTRAVENOUS
  Filled 2017-11-05: qty 2000

## 2017-11-05 MED ORDER — IPRATROPIUM BROMIDE 0.02 % IN SOLN
0.5000 mg | Freq: Four times a day (QID) | RESPIRATORY_TRACT | Status: DC
  Administered 2017-11-05 – 2017-11-21 (×64): 0.5 mg via RESPIRATORY_TRACT
  Filled 2017-11-05 (×64): qty 2.5

## 2017-11-05 MED ORDER — POTASSIUM CHLORIDE 10 MEQ/100ML IV SOLN
10.0000 meq | INTRAVENOUS | Status: AC
  Administered 2017-11-05 (×5): 10 meq via INTRAVENOUS
  Filled 2017-11-05 (×6): qty 100

## 2017-11-05 NOTE — Progress Notes (Signed)
ANTICOAGULATION CONSULT NOTE   Pharmacy Consult for Heparin Indication: chest pain/ACS  Allergies  Allergen Reactions  . Flexeril [Cyclobenzaprine] Hives  . Amoxicillin Hives and Rash    Has patient had a PCN reaction causing immediate rash, facial/tongue/throat swelling, SOB or lightheadedness with hypotension: Yes Has patient had a PCN reaction causing severe rash involving mucus membranes or skin necrosis: Yes Has patient had a PCN reaction that required hospitalization No Has patient had a PCN reaction occurring within the last 10 years: Yes If all of the above answers are "NO", then may proceed with Cephalosporin use.    Patient Measurements: Height: 5\' 4"  (162.6 cm) Weight: 203 lb 0.7 oz (92.1 kg) IBW/kg (Calculated) : 54.7 HEPARIN DW (KG): 75.1  Vital Signs: Temp: 97.8 F (36.6 C) (02/13 1147) Temp Source: Oral (02/13 1147) BP: 98/74 (02/13 1500) Pulse Rate: 71 (02/13 1500)  Labs: Recent Labs    10/30/2017 1752 11/04/17 0433  11/04/17 2108 11/04/17 2358 11/05/17 0458 11/05/17 0909 11/05/17 1439 11/05/17 1458  HGB 16.1* 15.6*  --   --   --   --   --   --   --   HCT 48.1* 47.8*  --   --   --   --   --   --   --   PLT 123* 118*  --   --   --   --   --   --   --   HEPARINUNFRC  --   --   --   --   --   --   --   --  0.59  CREATININE 1.02* 0.95   < > 1.31* 1.44* 1.48*  --   --   --   TROPONINI <0.03  --    < > 1.04*  --  1.26* 1.18* 1.30*  --    < > = values in this interval not displayed.   Estimated Creatinine Clearance: 49.5 mL/min (A) (by C-G formula based on SCr of 1.48 mg/dL (H)).  Medical History: Past Medical History:  Diagnosis Date  . Anxiety   . Arthritis   . Cardiac pacemaker in situ 2009   DDD [employer] -- ALTRUNA 60  . Complication of anesthesia   . COPD with asthma (HCC)    GOLD 2-3 --  pulmologist (last visit 2011) Dr. Marchelle Gearing  . Crohn's disease (HCC)    Large intestine  . Depression 2016   PTSD  . Essential hypertension   .  Family history of adverse reaction to anesthesia    father- stop breathing surgery   . Gastroparesis   . GERD (gastroesophageal reflux disease)   . History of hiatal hernia   . History of kidney stones   . History of stroke    Jun 2011 -- right hand weakness  . History of syncope   . Inappropriate sinus tachycardia    Sinus node modification 02-25-2003 by Dr. Lewayne Bunting  . LLQ abdominal tenderness 10/20/2015  . Neuromuscular disorder (HCC)    neuropathy   . OSA (obstructive sleep apnea)    Study done 2005 -- pt refused CPAP/previously was using nocturnal oxygen until one year ago pt states PCP is monitoring pt without  . Pelvic pain in female   . PONV (postoperative nausea and vomiting)   . Presence of permanent cardiac pacemaker   . PTSD (post-traumatic stress disorder)   . Sinus node dysfunction (HCC)    Symptomatic bradycardia  . Stroke [facility]) 2014   left leg  weakness- uses leg brace   . Type 2 diabetes mellitus (HCC)   . Wears glasses    Medications:  Medications Prior to Admission  Medication Sig Dispense Refill Last Dose  . acetaminophen (TYLENOL) 500 MG tablet Take 500 mg by mouth every 6 (six) hours as needed for mild pain or moderate pain.   11/02/2017 at Unknown time  . aspirin EC 81 MG tablet Take 81 mg by mouth daily.   11/19/2017 at Unknown time  . carvedilol (COREG) 6.25 MG tablet Take 1 tablet (6.25 mg total) by mouth 2 (two) times daily. 180 tablet 3 10/24/2017 at Unknown time  . Cholecalciferol (VITAMIN D3) 5000 units CAPS Take 1 capsule (5,000 Units total) by mouth daily. 90 capsule 0 10/26/2017 at Unknown time  . clopidogrel (PLAVIX) 75 MG tablet Take 75 mg by mouth daily.   11/08/2017 at Unknown time  . diclofenac sodium (VOLTAREN) 1 % GEL Apply 4 g topically 4 (four) times daily. (Patient taking differently: Apply 2-4 g topically daily as needed (for leg pain). ) 200 g 11 Past Month at Unknown time  . dicyclomine (BENTYL) 20 MG tablet Take 1 tablet (20 mg total) by  mouth every 8 (eight) hours as needed for spasms. 90 tablet 3 10/25/2017 at Unknown time  . diphenoxylate-atropine (LOMOTIL) 2.5-0.025 MG tablet Take 2 tabs by mouth x 1, then 1 tab every 4 hrs PRN, max of 8 tabs in 24 hour time 240 tablet 1 Past Week at Unknown time  . DULoxetine (CYMBALTA) 30 MG capsule Take 90 mg daily (60 mg + 30 mg) (Patient taking differently: Take 30 mg by mouth daily. ) 30 capsule 0 10/24/2017 at Unknown time  . DULoxetine (CYMBALTA) 60 MG capsule Take 90 mg daily (60 mg + 30 mg) 30 capsule 0 11/15/2017 at Unknown time  . furosemide (LASIX) 40 MG tablet TAKE (1) TABLET BY MOUTH EACH MORNING. 90 tablet 3 11/02/2017 at Unknown time  . ibuprofen (ADVIL,MOTRIN) 600 MG tablet Take 600 mg by mouth 2 (two) times daily as needed for moderate pain.    11/12/2017 at Unknown time  . insulin aspart (NOVOLOG FLEXPEN) 100 UNIT/ML FlexPen Inject 15-21 Units into the skin 3 (three) times daily with meals. 5 pen 2 10/26/2017 at Unknown time  . INVOKANA 100 MG TABS tablet TAKE 1 TABLET BY MOUTH DAILY BEFORE BREAKFAST. 30 tablet 2 11/15/2017 at Unknown time  . LEVEMIR FLEXTOUCH 100 UNIT/ML Pen INJECT 80 UNITS UNDER THE SKIN EVERY DAY AT 10 PM 30 mL 2 11/02/2017 at Unknown time  . lisinopril (PRINIVIL,ZESTRIL) 5 MG tablet Take 5 mg by mouth daily.   11/02/2017 at Unknown time  . LORazepam (ATIVAN) 0.5 MG tablet Take 1 tablet (0.5 mg total) by mouth daily as needed for anxiety. 30 tablet 0 Past Week at Unknown time  . LYRICA 150 MG capsule take 150mg  by mouth three times daily  0 11/20/2017 at Unknown time  . meloxicam (MOBIC) 15 MG tablet Take 15 mg by mouth daily.   11/11/2017 at Unknown time  . metFORMIN (GLUCOPHAGE) 1000 MG tablet TAKE 1 TABLET(1000 MG) BY MOUTH TWICE DAILY WITH A MEAL 60 tablet 3 11/01/2017 at Unknown time  . nitroGLYCERIN (NITROSTAT) 0.4 MG SL tablet Place 1 tablet (0.4 mg total) under the tongue every 5 (five) minutes as needed for chest pain. 30 tablet 0 unknown  . pantoprazole  (PROTONIX) 40 MG tablet Take 1 tablet (40 mg total) by mouth 2 (two) times daily. 60 tablet 0  11/06/17 at Unknown time  . potassium chloride (K-DUR) 10 MEQ tablet Take 1 tablet (10 mEq total) by mouth daily. 90 tablet 3 06-Nov-2017 at Unknown time  . pramipexole (MIRAPEX) 0.25 MG tablet Take 0.25 mg by mouth 2 (two) times daily.    11-06-2017 at Unknown time  . simvastatin (ZOCOR) 10 MG tablet TAKE (1) TABLET BY MOUTH DAILY. (Patient taking differently: TAKE (2) TABLET BY MOUTH DAILY.) 90 tablet 2 2017/11/06 at Unknown time  . tapentadol (NUCYNTA ER) 100 MG 12 hr tablet Take 100 mg by mouth every 12 (twelve) hours.   11/02/2017 at Unknown time  . tiZANidine (ZANAFLEX) 4 MG capsule Take 4 mg by mouth 3 (three) times daily.   11/06/17 at Unknown time  . albuterol (PROVENTIL) (2.5 MG/3ML) 0.083% nebulizer solution Take 3 mLs (2.5 mg total) by nebulization every 6 (six) hours as needed for wheezing or shortness of breath. 75 mL 12 unknown  . colestipol (COLESTID) 1 g tablet Take 1 tablet (1 g total) by mouth 2 (two) times daily. (Patient not taking: Reported on 11-06-2017) 90 tablet 3 unknown  . inFLIXimab 500 mg in sodium chloride 0.9 % 200 mL Inject 500 mg into the vein every 8 (eight) weeks. 1 Dose 6 Taking  . OXYGEN Inhale 3 L/day into the lungs at bedtime.   Taking   Assessment: Asked by cardiology to initiate Heparin for ACS.  Pt received Lovenox 40mg  SQ this am, charted at 0830.  Initial heparin level is on target.   Goal of Therapy:  Heparin level 0.3-0.7 units/ml Monitor platelets by anticoagulation protocol: Yes   Plan:   Continue Heparin infusion at 1050 units/hr  Heparin daily  CBC daily while on Heparin   Valrie Hart A 11/05/2017,4:13 PM

## 2017-11-05 NOTE — Progress Notes (Addendum)
[facility] PROGRESS NOTE  [name] GYF:[id number] DOB: 05-26-1966 DOA: 11/15/2017 PCP: [facility] [place]  Interim summary and HPI 52 y.o. female,  w Copd on home o2, apparently ran out of her breathing treatments and c/o increase in dyspnea. Pt states that her breathing started to become worse on Friday.  + cough w yellow sputum.  Denies fever, chills, cp, palpitations, brbpr.   Pt notes n/v, general malaise and diarrhea over the past 2 days.   In ED, pox on RA was in the mid to high 80's; found to have positive influenza A. CXR also demonstrated coarse interstitial marking and vascular congestion; unable to r/o underlying infiltrates. WBC's 12.6, fever 102.6, RR 25  Of, note patient was also found to be on DKA and 2-D Echo demonstrated EF of 30% with concerns for Takotsubo, cardiology following.   Assessment/Plan: 1-acute resp failure with hypoxia: in the setting of COPD exacerbation and influenza A. -continue ventilatory support -appreciate assistance by pulmonary/critical care service -continue steroids, Duoneb and antibiotics -will add pulmicort BID and atrovent QID -not ready for weaning from vent at this time  2-DKA: -in the setting of steroid use and infection and possibly invokana (currently being held) -Continue DKA protocol and IV insulin for now, acidosis is correcting, continue NPO status  -follow CBGs -follow BMP  3-acute Cardiomyopathy: concern for takotsubo; positive apical wall motion abnormalities and decrease EF; no prior hx of CAD or heart failure. -cardiology consulted and working patient up -following rec's requested PICC line to check CVP and COX -continue lasix -given low BP now, will avoid the use of B-blocker, ARB/ACEI -daily weights and strict intake and output  4-influenza A -will continue providing supportive care and tamiflu  5-GERD/GI prophylaxis -continue PPI  6-Sepsis: -code sepsis initiated -continue levaquin,  cefepime and vancomycin   7. Elevated troponin / ACS - cardiology starting patient on IV heparin infusion.  Code Status: Full Family Communication: no family at bedside Disposition Plan: remains in ICU, continue ventilatory support, IV broad spectrum antibiotics; will follow cardiology rec's and continue treatment for DKA per protocol.   Consultants:  Cardiology  Pulmonary/critical care  Procedures:  See below for x-ray reports   2-D echo: EF 30%, apical WMA  Antibiotics:  Cefepime, vancomycin and levaquin 2/12  HPI/Subjective: Currently afebrile and sedated, ventilatory support in place.   Objective: Vitals:   11/05/17 0615 11/05/17 0630  BP: 94/68 99/73  Pulse: 68 69  Resp: (!) 29 (!) 32  Temp:    SpO2: 94% 95%    Intake/Output Summary (Last 24 hours) at 11/05/2017 0803 Last data filed at 11/05/2017 0500 Gross per 24 hour  Intake 300 ml  Output 1050 ml  Net -750 ml   Filed Weights   11/14/2017 1735 11/04/17 0200 11/05/17 0500  Weight: 96.2 kg (212 lb) 90.9 kg (200 lb 6.4 oz) 92.1 kg (203 lb 0.7 oz)    Exam:   General:  Afebrile, under sedation and ventilated.  Cardiovascular: S1 and S2, no rubs or gallops; unable to assess JVD due to body habitus.  Respiratory: expiratory wheezes heard bilateral. Rales heard diffusely.  Abdomen: obese, soft, NT, ND, positive BS  Musculoskeletal: trace edema, no cyanosis, no clubbing   Data Reviewed: Basic Metabolic Panel: Recent Labs  Lab 11/04/17 1219 11/04/17 1619 11/04/17 2108 11/04/17 2358 11/05/17 0458  NA 135 138 137 136 142  K 3.8 3.5 3.6 3.5 3.4*  CL 104 108 106 106 109  CO2 16* 17* 18* 19*  19*  GLUCOSE 403* 265* 134* 168* 158*  BUN 38* 40* 41* 44* 48*  CREATININE 1.23* 1.28* 1.31* 1.44* 1.48*  CALCIUM 8.4* 8.7* 8.5* 8.5* 8.6*   Liver Function Tests: Recent Labs  Lab 11/17/2017 1752 11/04/17 0433  AST 31 42*  ALT 27 25  ALKPHOS 68 67  BILITOT 0.9 1.2  PROT 7.4 7.3  ALBUMIN 3.1* 3.0*    Recent Labs  Lab 10/24/2017 1806  LIPASE 19   CBC: Recent Labs  Lab 11/12/2017 1752 11/04/17 0433  WBC 12.6* 11.4*  NEUTROABS 9.4*  --   HGB 16.1* 15.6*  HCT 48.1* 47.8*  MCV 92.9 95.0  PLT 123* 118*   Cardiac Enzymes: Recent Labs  Lab 10/29/2017 1752 11/04/17 1619 11/04/17 2108 11/05/17 0458  TROPONINI <0.03 0.90* 1.04* 1.26*   BNP (last 3 results) Recent Labs    09/20/17 2346 11/20/2017 1753 11/04/17 1619  BNP 227.0* 36.0 368.0*    CBG: Recent Labs  Lab 11/05/17 0133 11/05/17 0232 11/05/17 0332 11/05/17 0434 11/05/17 0622  GLUCAP 128* 148* 135* 149* 143*    Recent Results (from the past 240 hour(s))  Blood Culture (routine x 2)     Status: None (Preliminary result)   Collection Time: 11/20/2017  6:27 PM  Result Value Ref Range Status   Specimen Description RIGHT ANTECUBITAL  Final   Special Requests   Final    BOTTLES DRAWN AEROBIC AND ANAEROBIC Blood Culture adequate volume   Culture   Final    NO GROWTH 2 DAYS Performed at [facility], [address], [place], [place] [address]    Report Status PENDING  Incomplete  Blood Culture (routine x 2)     Status: None (Preliminary result)   Collection Time: 10/31/2017  6:27 PM  Result Value Ref Range Status   Specimen Description RIGHT ANTECUBITAL  Final   Special Requests   Final    BOTTLES DRAWN AEROBIC AND ANAEROBIC Blood Culture adequate volume   Culture   Final    NO GROWTH 2 DAYS Performed at [facility], [address], [place], [place] [address]    Report Status PENDING  Incomplete  MRSA PCR Screening     Status: None   Collection Time: 11/04/17  1:26 AM  Result Value Ref Range Status   MRSA by PCR NEGATIVE NEGATIVE Final    Comment:        The GeneXpert MRSA Assay (FDA approved for NASAL specimens only), is one component of a comprehensive MRSA colonization surveillance program. It is not intended to diagnose MRSA infection nor to guide or monitor treatment for MRSA infections. Performed  at [facility], [address], [place], [place] [address]      Studies: Dg Chest 1v Repeat Same Day  Result Date: 11/04/2017 CLINICAL DATA:  Orogastric tube placement. EXAM: CHEST - 1 VIEW SAME DAY COMPARISON:  Chest radiograph performed earlier today at 3:20 a.m. FINDINGS: The patient's endotracheal tube is seen ending 5 cm above the carina. An enteric tube is noted extending below the diaphragm. Vascular congestion is again noted. Bilateral airspace opacification is again noted, concerning for pneumonia, though underlying pulmonary edema may be present. No pleural effusion or pneumothorax is seen. The cardiomediastinal silhouette is borderline normal in size. A pacemaker is noted overlying the left chest wall, with leads ending overlying the right atrium and right ventricle. No acute osseous abnormalities are identified. IMPRESSION: 1. Enteric tube noted extending below the diaphragm. 2. Endotracheal tube seen ending 5 cm above the  carina. 3. Vascular congestion. Persistent bilateral airspace opacification is concerning for pneumonia, though underlying pulmonary edema may be present. Electronically Signed   By: Garald Balding M.D.   On: 11/04/2017 03:59   Portable Chest Xray  Result Date: 11/05/2017 CLINICAL DATA:  Respiratory failure.  COPD. EXAM: PORTABLE CHEST 1 VIEW COMPARISON:  Portable chest x-ray of November 12, 2017 FINDINGS: The patient is rotated on this study which limits comparison with the previous study. The lungs are well-expanded. The interstitial markings remain increased bilaterally. There are confluent infiltrates in the right perihilar and left perihilar regions which are stable. The cardiac silhouette is mildly enlarged. The pulmonary vascularity is not clearly engorged. The endotracheal tube tip lies approximately 5.1 cm above the carina. The esophagogastric tube tip in proximal port lie below the left hemidiaphragm. The ICD is grossly stable in appearance. IMPRESSION: Stable  bilateral perihilar atelectasis or pneumonia. Mild pulmonary interstitial edema, stable. The support tubes are in reasonable position. Electronically Signed   By: David  Martinique M.D.   On: 11/05/2017 07:49   Dg Chest Port 1 View  Result Date: 11/04/2017 CLINICAL DATA:  Endotracheal tube placement. EXAM: PORTABLE CHEST 1 VIEW COMPARISON:  Chest radiograph performed 10/26/2017 FINDINGS: The patient's endotracheal tube is seen ending 2 cm above the carina. Bilateral airspace opacification raises concern for pneumonia. Underlying pulmonary edema may be present. No pleural effusion or pneumothorax is seen. The cardiomediastinal silhouette is borderline normal in size. A pacemaker is noted overlying the left chest wall, with leads ending overlying the right atrium and right ventricle. No acute osseous abnormalities are seen. IMPRESSION: 1. Endotracheal tube seen ending 2 cm above the carina. 2. Bilateral airspace opacification raises concern for pneumonia. Underlying pulmonary edema may be present. Electronically Signed   By: Garald Balding M.D.   On: 11/04/2017 03:36   Dg Chest Port 1 View  Result Date: 11/10/2017 CLINICAL DATA:  52 year old female with a history of abdominal pain and cough EXAM: PORTABLE CHEST 1 VIEW COMPARISON:  09/21/2017 FINDINGS: Cardiomediastinal silhouette unchanged in size and contour. Fullness in the central vasculature. Coarsened interstitial markings bilaterally. No pleural effusion. No pneumothorax. Cardiac pacing device on left chest wall. IMPRESSION: Coarsened interstitial markings, may reflect atypical infection or developing edema. Cardiac pacing device on the left chest wall. Electronically Signed   By: Corrie Mckusick D.O.   On: 11/09/2017 18:29    Scheduled Meds: . aspirin EC  81 mg Oral Daily  . budesonide (PULMICORT) nebulizer solution  0.5 mg Nebulization BID  . chlorhexidine gluconate (MEDLINE KIT)  15 mL Mouth Rinse BID  . chlorhexidine gluconate (MEDLINE KIT)  15 mL  Mouth Rinse BID  . clopidogrel  75 mg Oral Daily  . enoxaparin (LOVENOX) injection  40 mg Subcutaneous Q24H  . furosemide  40 mg Intravenous Q12H  . ipratropium  0.5 mg Nebulization QID  . levalbuterol  0.63 mg Nebulization Q6H  . mouth rinse  15 mL Mouth Rinse QID  . mouth rinse  15 mL Mouth Rinse QID  . methylPREDNISolone (SOLU-MEDROL) injection  80 mg Intravenous Q8H  . oseltamivir  75 mg Oral BID  . pantoprazole (PROTONIX) IV  40 mg Intravenous QHS  . potassium chloride  10 mEq Oral Daily  . sodium chloride flush  3 mL Intravenous Q12H  . tapentadol  100 mg Oral Q12H   Continuous Infusions: . sodium chloride    . sodium chloride    . ceFEPime (MAXIPIME) IV Stopped (11/05/17 0142)  . dextrose 5 %  and 0.45% NaCl 50 mL/hr at 11/04/17 1730  . insulin (NOVOLIN-R) infusion 6.4 Units/hr (11/05/17 3267)  . levofloxacin (LEVAQUIN) IV Stopped (11/05/17 0020)  . propofol (DIPRIVAN) infusion 55 mcg/kg/min (11/05/17 0442)  . vancomycin Stopped (11/05/17 0630)    Active Problems:   COPD exacerbation ([place])   Influenza A  Critical Care Time spent: 35 minutes  [facility] Pager [phone]. If 7PM-7AM, please contact night-coverage at [email or web address], password [email or web address] 11/05/2017, 8:03 AM  LOS: 2 days

## 2017-11-05 NOTE — Progress Notes (Addendum)
Inpatient Diabetes Program Recommendations  AACE/ADA: New Consensus Statement on Inpatient Glycemic Control (2015)  Target Ranges:  Prepandial:   less than 140 mg/dL      Peak postprandial:   less than 180 mg/dL (1-2 hours)      Critically ill patients:  140 - 180 mg/dL   Lab Results  Component Value Date   GLUCAP 143 (H) 11/05/2017   HGBA1C 8.8 (H) 11/04/2017    Review of Glycemic ControlResults for JUNG, BURBACK (MRN [id number]) as of 11/05/2017 07:34  Ref. Range 11/05/2017 01:33 11/05/2017 02:32 11/05/2017 03:32 11/05/2017 04:34 11/05/2017 06:22  Glucose-Capillary Latest Ref Range: 65 - 99 mg/dL 088 (H) 110 (H) 315 (H) 149 (H) 143 (H)  Diabetes history: Type 2  Outpatient Diabetes medications: Novolog 15-21 units tid, Invokana 100mg  qam, Levemir 80 units qhs, Metformin 1000mg  bid Current orders for Inpatient glycemic control:  IV insulin/DKA order set.  Note that CO2=19 and AG=14 this morning.   Inpatient Diabetes Program Recommendations:    MD, patient was on SGLT-2 prior to admit which could increase chances of DKA?? Please do not resume at d/c. Will follow.   Thanks,  Beryl Meager, RN, BC-ADM Inpatient Diabetes Coordinator Pager [phone] (8a-5p)

## 2017-11-05 NOTE — Progress Notes (Signed)
ANTICOAGULATION CONSULT NOTE - Initial Consult  Pharmacy Consult for Heparin Indication: chest pain/ACS  Allergies  Allergen Reactions  . Flexeril [Cyclobenzaprine] Hives  . Amoxicillin Hives and Rash    Has patient had a PCN reaction causing immediate rash, facial/tongue/throat swelling, SOB or lightheadedness with hypotension: Yes Has patient had a PCN reaction causing severe rash involving mucus membranes or skin necrosis: Yes Has patient had a PCN reaction that required hospitalization No Has patient had a PCN reaction occurring within the last 10 years: Yes If all of the above answers are "NO", then may proceed with Cephalosporin use.    Patient Measurements: Height: 5\' 4"  (162.6 cm) Weight: 203 lb 0.7 oz (92.1 kg) IBW/kg (Calculated) : 54.7 HEPARIN DW (KG): 75.1  Vital Signs: Temp: 98.1 F (36.7 C) (02/13 0400) Temp Source: Axillary (02/13 0400) BP: 99/73 (02/13 0630) Pulse Rate: 69 (02/13 0630)  Labs: Recent Labs    10/26/2017 1752 11/04/17 0433  11/04/17 1619 11/04/17 2108 11/04/17 2358 11/05/17 0458  HGB 16.1* 15.6*  --   --   --   --   --   HCT 48.1* 47.8*  --   --   --   --   --   PLT 123* 118*  --   --   --   --   --   CREATININE 1.02* 0.95   < > 1.28* 1.31* 1.44* 1.48*  TROPONINI <0.03  --   --  0.90* 1.04*  --  1.26*   < > = values in this interval not displayed.   Estimated Creatinine Clearance: 49.5 mL/min (A) (by C-G formula based on SCr of 1.48 mg/dL (H)).  Medical History: Past Medical History:  Diagnosis Date  . Anxiety   . Arthritis   . Cardiac pacemaker in situ 2009   DDD [employer] -- ALTRUNA 60  . Complication of anesthesia   . COPD with asthma (HCC)    GOLD 2-3 --  pulmologist (last visit 2011) Dr. Marchelle Gearing  . Crohn's disease (HCC)    Large intestine  . Depression 2016   PTSD  . Essential hypertension   . Family history of adverse reaction to anesthesia    father- stop breathing surgery   . Gastroparesis   . GERD  (gastroesophageal reflux disease)   . History of hiatal hernia   . History of kidney stones   . History of stroke    Jun 2011 -- right hand weakness  . History of syncope   . Inappropriate sinus tachycardia    Sinus node modification 02-25-2003 by Dr. Lewayne Bunting  . LLQ abdominal tenderness 10/20/2015  . Neuromuscular disorder (HCC)    neuropathy   . OSA (obstructive sleep apnea)    Study done 2005 -- pt refused CPAP/previously was using nocturnal oxygen until one year ago pt states PCP is monitoring pt without  . Pelvic pain in female   . PONV (postoperative nausea and vomiting)   . Presence of permanent cardiac pacemaker   . PTSD (post-traumatic stress disorder)   . Sinus node dysfunction (HCC)    Symptomatic bradycardia  . Stroke [facility]) 2014   left leg weakness- uses leg brace   . Type 2 diabetes mellitus (HCC)   . Wears glasses    Medications:  Medications Prior to Admission  Medication Sig Dispense Refill Last Dose  . acetaminophen (TYLENOL) 500 MG tablet Take 500 mg by mouth every 6 (six) hours as needed for mild pain or moderate pain.   11/02/2017  at Unknown time  . aspirin EC 81 MG tablet Take 81 mg by mouth daily.   11/09/2017 at Unknown time  . carvedilol (COREG) 6.25 MG tablet Take 1 tablet (6.25 mg total) by mouth 2 (two) times daily. 180 tablet 3 11/09/2017 at Unknown time  . Cholecalciferol (VITAMIN D3) 5000 units CAPS Take 1 capsule (5,000 Units total) by mouth daily. 90 capsule 0 11/05/2017 at Unknown time  . clopidogrel (PLAVIX) 75 MG tablet Take 75 mg by mouth daily.   11/08/2017 at Unknown time  . diclofenac sodium (VOLTAREN) 1 % GEL Apply 4 g topically 4 (four) times daily. (Patient taking differently: Apply 2-4 g topically daily as needed (for leg pain). ) 200 g 11 Past Month at Unknown time  . dicyclomine (BENTYL) 20 MG tablet Take 1 tablet (20 mg total) by mouth every 8 (eight) hours as needed for spasms. 90 tablet 3 11/10/2017 at Unknown time  .  diphenoxylate-atropine (LOMOTIL) 2.5-0.025 MG tablet Take 2 tabs by mouth x 1, then 1 tab every 4 hrs PRN, max of 8 tabs in 24 hour time 240 tablet 1 Past Week at Unknown time  . DULoxetine (CYMBALTA) 30 MG capsule Take 90 mg daily (60 mg + 30 mg) (Patient taking differently: Take 30 mg by mouth daily. ) 30 capsule 0 10/26/2017 at Unknown time  . DULoxetine (CYMBALTA) 60 MG capsule Take 90 mg daily (60 mg + 30 mg) 30 capsule 0 11/06/2017 at Unknown time  . furosemide (LASIX) 40 MG tablet TAKE (1) TABLET BY MOUTH EACH MORNING. 90 tablet 3 11/19/2017 at Unknown time  . ibuprofen (ADVIL,MOTRIN) 600 MG tablet Take 600 mg by mouth 2 (two) times daily as needed for moderate pain.    11/02/2017 at Unknown time  . insulin aspart (NOVOLOG FLEXPEN) 100 UNIT/ML FlexPen Inject 15-21 Units into the skin 3 (three) times daily with meals. 5 pen 2 11/19/2017 at Unknown time  . INVOKANA 100 MG TABS tablet TAKE 1 TABLET BY MOUTH DAILY BEFORE BREAKFAST. 30 tablet 2 11/02/2017 at Unknown time  . LEVEMIR FLEXTOUCH 100 UNIT/ML Pen INJECT 80 UNITS UNDER THE SKIN EVERY DAY AT 10 PM 30 mL 2 11/02/2017 at Unknown time  . lisinopril (PRINIVIL,ZESTRIL) 5 MG tablet Take 5 mg by mouth daily.   11/04/2017 at Unknown time  . LORazepam (ATIVAN) 0.5 MG tablet Take 1 tablet (0.5 mg total) by mouth daily as needed for anxiety. 30 tablet 0 Past Week at Unknown time  . LYRICA 150 MG capsule take 150mg  by mouth three times daily  0 10/25/2017 at Unknown time  . meloxicam (MOBIC) 15 MG tablet Take 15 mg by mouth daily.   10/27/2017 at Unknown time  . metFORMIN (GLUCOPHAGE) 1000 MG tablet TAKE 1 TABLET(1000 MG) BY MOUTH TWICE DAILY WITH A MEAL 60 tablet 3 11/17/2017 at Unknown time  . nitroGLYCERIN (NITROSTAT) 0.4 MG SL tablet Place 1 tablet (0.4 mg total) under the tongue every 5 (five) minutes as needed for chest pain. 30 tablet 0 unknown  . pantoprazole (PROTONIX) 40 MG tablet Take 1 tablet (40 mg total) by mouth 2 (two) times daily. 60 tablet 0  11/10/2017 at Unknown time  . potassium chloride (K-DUR) 10 MEQ tablet Take 1 tablet (10 mEq total) by mouth daily. 90 tablet 3 11/16/2017 at Unknown time  . pramipexole (MIRAPEX) 0.25 MG tablet Take 0.25 mg by mouth 2 (two) times daily.    11/10/2017 at Unknown time  . simvastatin (ZOCOR) 10 MG tablet TAKE (1)  TABLET BY MOUTH DAILY. (Patient taking differently: TAKE (2) TABLET BY MOUTH DAILY.) 90 tablet 2 11/13/2017 at Unknown time  . tapentadol (NUCYNTA ER) 100 MG 12 hr tablet Take 100 mg by mouth every 12 (twelve) hours.   11/02/2017 at Unknown time  . tiZANidine (ZANAFLEX) 4 MG capsule Take 4 mg by mouth 3 (three) times daily.   11/02/2017 at Unknown time  . albuterol (PROVENTIL) (2.5 MG/3ML) 0.083% nebulizer solution Take 3 mLs (2.5 mg total) by nebulization every 6 (six) hours as needed for wheezing or shortness of breath. 75 mL 12 unknown  . colestipol (COLESTID) 1 g tablet Take 1 tablet (1 g total) by mouth 2 (two) times daily. (Patient not taking: Reported on 11/20/2017) 90 tablet 3 unknown  . inFLIXimab 500 mg in sodium chloride 0.9 % 200 mL Inject 500 mg into the vein every 8 (eight) weeks. 1 Dose 6 Taking  . OXYGEN Inhale 3 L/day into the lungs at bedtime.   Taking   Assessment: Asked by cardiology to initiate Heparin for ACS.  Pt received Lovenox 40mg  SQ this am, charted at 0830.    Goal of Therapy:  Heparin level 0.3-0.7 units/ml Monitor platelets by anticoagulation protocol: Yes   Plan:   Heparin 2000 units IV now x 1  Heparin infusion at 1050 units/hr  Heparin level in 6-8 hrs then daily  CBC daily while on Heparin   Margo Aye, Eulala Newcombe A 11/05/2017,8:40 AM

## 2017-11-05 NOTE — Progress Notes (Signed)
Progress Note  Patient Name: [name] Date of Encounter: 11/05/2017  Primary Cardiologist: No primary care provider on file.   Subjective  No events overnight   Inpatient Medications    Scheduled Meds: . aspirin EC  81 mg Oral Daily  . budesonide (PULMICORT) nebulizer solution  0.5 mg Nebulization BID  . chlorhexidine gluconate (MEDLINE KIT)  15 mL Mouth Rinse BID  . chlorhexidine gluconate (MEDLINE KIT)  15 mL Mouth Rinse BID  . clopidogrel  75 mg Oral Daily  . enoxaparin (LOVENOX) injection  40 mg Subcutaneous Q24H  . furosemide  40 mg Intravenous Q12H  . ipratropium  0.5 mg Nebulization QID  . levalbuterol  0.63 mg Nebulization Q6H  . mouth rinse  15 mL Mouth Rinse QID  . mouth rinse  15 mL Mouth Rinse QID  . methylPREDNISolone (SOLU-MEDROL) injection  80 mg Intravenous Q8H  . oseltamivir  75 mg Oral BID  . pantoprazole (PROTONIX) IV  40 mg Intravenous QHS  . [START ON 11/06/2017] potassium chloride  10 mEq Oral Daily  . sodium chloride flush  3 mL Intravenous Q12H  . tapentadol  100 mg Oral Q12H   Continuous Infusions: . sodium chloride    . sodium chloride    . ceFEPime (MAXIPIME) IV Stopped (11/05/17 0142)  . dextrose 5 % and 0.45% NaCl 50 mL/hr at 11/04/17 1730  . insulin (NOVOLIN-R) infusion 6.4 Units/hr (11/05/17 3491)  . levofloxacin (LEVAQUIN) IV Stopped (11/05/17 0020)  . potassium chloride    . propofol (DIPRIVAN) infusion 55 mcg/kg/min (11/05/17 0442)  . vancomycin Stopped (11/05/17 0630)   PRN Meds: sodium chloride, acetaminophen **OR** acetaminophen, fentaNYL (SUBLIMAZE) injection, fentaNYL (SUBLIMAZE) injection, levalbuterol, LORazepam, sodium chloride flush   Vital Signs    Vitals:   11/05/17 0545 11/05/17 0600 11/05/17 0615 11/05/17 0630  BP: _0 99/73  Pulse: 66 69 68 69  Resp: (!) 28 (!) 31 (!) 29 (!) 32  Temp:      TempSrc:      SpO2: 95% 94% 94% 95%  Weight:      Height:        Intake/Output Summary (Last 24  hours) at 11/05/2017 0817 Last data filed at 11/05/2017 0500 Gross per 24 hour  Intake 300 ml  Output 1050 ml  Net -750 ml   Filed Weights   11/10/2017 1735 11/04/17 0200 11/05/17 0500  Weight: 212 lb (96.2 kg) 200 lb 6.4 oz (90.9 kg) 203 lb 0.7 oz (92.1 kg)    Telemetry    SR - Personally Reviewed  ECG    n/a  Physical Exam   GEN: No acute distress.  Sedated/intubated Neck: No JVD Cardiac: RRR, no murmurs, rubs, or gallops.  Respiratory: Clear to auscultation bilaterally. GI: Soft, nontender, non-distended  MS: No edema; No deformity. Neuro:  Nonfocal  Psych: Normal affect   Labs    Chemistry Recent Labs  Lab 11/05/2017 1752 11/04/17 0433  11/04/17 2108 11/04/17 2358 11/05/17 0458  NA 135 135   < > 137 136 142  K 4.2 4.5   < > 3.6 3.5 3.4*  CL 100* 102   < > 106 106 109  CO2 19* 15*   < > 18* 19* 19*  GLUCOSE 395* 453*   < > 134* 168* 158*  BUN 29* 27*   < > 41* 44* 48*  CREATININE 1.02* 0.95   < > 1.31* 1.44* 1.48*  CALCIUM 8.7* 8.2*   < > 8.5* 8.5* 8.6*  PROT 7.4 7.3  --   --   --   --   ALBUMIN 3.1* 3.0*  --   --   --   --   AST 31 42*  --   --   --   --   ALT 27 25  --   --   --   --   ALKPHOS 68 67  --   --   --   --   BILITOT 0.9 1.2  --   --   --   --   GFRNONAA >60 >60   < > 46* 41* 40*  GFRAA >60 >60   < > 54* 48* 46*  ANIONGAP 16* 18*   < > _0 < > = values in this interval not displayed.     Hematology Recent Labs  Lab 10/26/2017 1752 11/04/17 0433  WBC 12.6* 11.4*  RBC 5.18* 5.03  HGB 16.1* 15.6*  HCT 48.1* 47.8*  MCV 92.9 95.0  MCH 31.1 31.0  MCHC 33.5 32.6  RDW 13.9 14.2  PLT 123* 118*    Cardiac Enzymes Recent Labs  Lab 10/31/2017 1752 11/04/17 1619 11/04/17 2108 11/05/17 0458  TROPONINI <0.03 0.90* 1.04* 1.26*   No results for input(s): TROPIPOC in the last 168 hours.   BNP Recent Labs  Lab 11/19/2017 1753 11/04/17 1619  BNP 36.0 368.0*     DDimer No results for input(s): DDIMER in the last 168 hours.    Radiology    Dg Chest 1v Repeat Same Day  Result Date: 11/04/2017 CLINICAL DATA:  Orogastric tube placement. EXAM: CHEST - 1 VIEW SAME DAY COMPARISON:  Chest radiograph performed earlier today at 3:20 a.m. FINDINGS: The patient's endotracheal tube is seen ending 5 cm above the carina. An enteric tube is noted extending below the diaphragm. Vascular congestion is again noted. Bilateral airspace opacification is again noted, concerning for pneumonia, though underlying pulmonary edema may be present. No pleural effusion or pneumothorax is seen. The cardiomediastinal silhouette is borderline normal in size. A pacemaker is noted overlying the left chest wall, with leads ending overlying the right atrium and right ventricle. No acute osseous abnormalities are identified. IMPRESSION: 1. Enteric tube noted extending below the diaphragm. 2. Endotracheal tube seen ending 5 cm above the carina. 3. Vascular congestion. Persistent bilateral airspace opacification is concerning for pneumonia, though underlying pulmonary edema may be present. Electronically Signed   By: Garald Balding M.D.   On: 11/04/2017 03:59   Portable Chest Xray  Result Date: 11/05/2017 CLINICAL DATA:  Respiratory failure.  COPD. EXAM: PORTABLE CHEST 1 VIEW COMPARISON:  Portable chest x-ray of November 12, 2017 FINDINGS: The patient is rotated on this study which limits comparison with the previous study. The lungs are well-expanded. The interstitial markings remain increased bilaterally. There are confluent infiltrates in the right perihilar and left perihilar regions which are stable. The cardiac silhouette is mildly enlarged. The pulmonary vascularity is not clearly engorged. The endotracheal tube tip lies approximately 5.1 cm above the carina. The esophagogastric tube tip in proximal port lie below the left hemidiaphragm. The ICD is grossly stable in appearance. IMPRESSION: Stable bilateral perihilar atelectasis or pneumonia. Mild pulmonary  interstitial edema, stable. The support tubes are in reasonable position. Electronically Signed   By: David  Martinique M.D.   On: 11/05/2017 07:49   Dg Chest Port 1 View  Result Date: 11/04/2017 CLINICAL DATA:  Endotracheal tube placement. EXAM: PORTABLE CHEST 1 VIEW COMPARISON:  Chest radiograph  performed 10/25/2017 FINDINGS: The patient's endotracheal tube is seen ending 2 cm above the carina. Bilateral airspace opacification raises concern for pneumonia. Underlying pulmonary edema may be present. No pleural effusion or pneumothorax is seen. The cardiomediastinal silhouette is borderline normal in size. A pacemaker is noted overlying the left chest wall, with leads ending overlying the right atrium and right ventricle. No acute osseous abnormalities are seen. IMPRESSION: 1. Endotracheal tube seen ending 2 cm above the carina. 2. Bilateral airspace opacification raises concern for pneumonia. Underlying pulmonary edema may be present. Electronically Signed   By: Garald Balding M.D.   On: 11/04/2017 03:36   Dg Chest Port 1 View  Result Date: 11/11/2017 CLINICAL DATA:  52 year old female with a history of abdominal pain and cough EXAM: PORTABLE CHEST 1 VIEW COMPARISON:  09/21/2017 FINDINGS: Cardiomediastinal silhouette unchanged in size and contour. Fullness in the central vasculature. Coarsened interstitial markings bilaterally. No pleural effusion. No pneumothorax. Cardiac pacing device on left chest wall. IMPRESSION: Coarsened interstitial markings, may reflect atypical infection or developing edema. Cardiac pacing device on the left chest wall. Electronically Signed   By: Corrie Mckusick D.O.   On: 11/13/2017 18:29    Cardiac Studies    Patient Profile     [name] is a 52 y.o. female with a hx of sinus node dysfunction with pacemaker, COPD, Crohns who is being seen today for the evaluation of SOB at the request of Dr Dyann Kief.  Assessment & Plan    1. 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 ACS less likely. Will cycle EKG and enzymes, once more stable I think she may require a cath to confirm the diagnosis.  - fairly mild troponin elevation given the degree of cardiac dysfunction, EKG remains stable without acute ischemic changes.  - AKI, we will hold diuretics (she did receive early AM dose of IV lasix 40). Negative 750 yesterday, +1.5 L this admit. Careful management of fluid status in patient with acute systolic HF but likely also vasodilated from sepsis. CXR this AM with just mild edema. From pulm notes have been able to wean FiO2 on vent to 60%.  - soft bp's but MAPS overall at goal. Continue to hold on beta blocker or ACE/ARB/ARNI/aldactone.  - we will order CVP monitoring and co-oximetery panel from PICC line. Extremities are very warm on exam suggesting appropriate perfusion   2. Influenza - management per primary team.   3. Troponin elevation - fairly mild troponin elevation given the degree of cardiac dysfunction, EKG remains stable without acute ischemic changes.  - suspect related to demand ischemia and stress induced CM. However, would heparnize until trop peaks. - start hep gtt  - continue ASA/plavix (looks to have been on DAPT at home due to prior CVA)  4. AKI - we will hold diuretics - could be a number of causes including ATN from sepsis, prerenal, abx/medication induced - from cardiac standpoint we will check FeUrea, follow CVP once PICC line is placed, hold diuretics. No ACE/ARB/ARNI/aldactone at this time.     For questions or updates, please contact [place] Please consult [email or web address] for contact info under Cardiology/STEMI.      Merrily Pew, MD  11/05/2017, 8:17 AM

## 2017-11-05 NOTE — Progress Notes (Signed)
Critical troponin of 1.30. Notified MD. No new orders at this time. Will continue to monitor.

## 2017-11-05 NOTE — Progress Notes (Signed)
Subjective: She remains intubated and on the ventilator.  She was admitted with COPD exacerbation and influenza A and ended up having to be intubated and placed on mechanical ventilation.  Echocardiogram yesterday showed ejection fraction of about 30% which is a new problem.  She has also developed diabetic ketoacidosis.  Objective: Vital signs in last 24 hours: Temp:  [98.1 F (36.7 C)-99.2 F (37.3 C)] 98.1 F (36.7 C) (02/13 0400) Pulse Rate:  [37-123] 69 (02/13 0630) Resp:  [21-52] 32 (02/13 0630) BP: (77-120)/(54-88) 99/73 (02/13 0630) SpO2:  [82 %-97 %] 95 % (02/13 0630) FiO2 (%):  [60 %-70 %] 60 % (02/13 0412) Weight:  [92.1 kg (203 lb 0.7 oz)] 92.1 kg (203 lb 0.7 oz) (02/13 0500) Weight change: -4.063 kg (-15.3 oz) Last BM Date: 11/04/17  Intake/Output from previous day: 02/12 0701 - 02/13 0700 In: 300 [I.V.:300] Out: 1050 [Urine:1050]  PHYSICAL EXAM General appearance: Intubated sedated on mechanical ventilation Resp: rales bilaterally and rhonchi bilaterally Cardio: regular rate and rhythm, S1, S2 normal, no murmur, click, rub or gallop GI: soft, non-tender; bowel sounds normal; no masses,  no organomegaly Extremities: extremities normal, atraumatic, no cyanosis or edema Looks more comfortable than yesterday  Lab Results:  Results for orders placed or performed during the hospital encounter of 11/20/2017 (from the past 48 hour(s))  Comprehensive metabolic panel     Status: Abnormal   Collection Time: 11/19/2017  5:52 PM  Result Value Ref Range   Sodium 135 135 - 145 mmol/L   Potassium 4.2 3.5 - 5.1 mmol/L   Chloride 100 (L) 101 - 111 mmol/L   CO2 19 (L) 22 - 32 mmol/L   Glucose, Bld 395 (H) 65 - 99 mg/dL   BUN 29 (H) 6 - 20 mg/dL   Creatinine, Ser 1.02 (H) 0.44 - 1.00 mg/dL   Calcium 8.7 (L) 8.9 - 10.3 mg/dL   Total Protein 7.4 6.5 - 8.1 g/dL   Albumin 3.1 (L) 3.5 - 5.0 g/dL   AST 31 15 - 41 U/L   ALT 27 14 - 54 U/L   Alkaline Phosphatase 68 38 - 126 U/L    Total Bilirubin 0.9 0.3 - 1.2 mg/dL   GFR calc non Af Amer >60 >60 mL/min   GFR calc Af Amer >60 >60 mL/min    Comment: (NOTE) The eGFR has been calculated using the CKD EPI equation. This calculation has not been validated in all clinical situations. eGFR's persistently <60 mL/min signify possible Chronic Kidney Disease.    Anion gap 16 (H) 5 - 15    Comment: Performed at [facility], [address], [place], [place] [address]  CBC WITH DIFFERENTIAL     Status: Abnormal   Collection Time: 11/11/2017  5:52 PM  Result Value Ref Range   WBC 12.6 (H) 4.0 - 10.5 K/uL   RBC 5.18 (H) 3.87 - 5.11 MIL/uL   Hemoglobin 16.1 (H) 12.0 - 15.0 g/dL   HCT 48.1 (H) 36.0 - 46.0 %   MCV 92.9 78.0 - 100.0 fL   MCH 31.1 26.0 - 34.0 pg   MCHC 33.5 30.0 - 36.0 g/dL   RDW 13.9 11.5 - 15.5 %   Platelets 123 (L) 150 - 400 K/uL   Neutrophils Relative % 74 %   Neutro Abs 9.4 (H) 1.7 - 7.7 K/uL   Lymphocytes Relative 10 %   Lymphs Abs 1.2 0.7 - 4.0 K/uL   Monocytes Relative 16 %   Monocytes Absolute 2.0 (H) 0.1 -  1.0 K/uL   Eosinophils Relative 0 %   Eosinophils Absolute 0.0 0.0 - 0.7 K/uL   Basophils Relative 0 %   Basophils Absolute 0.0 0.0 - 0.1 K/uL    Comment: Performed at [facility], [address] [place], [place] [address]  Troponin I     Status: None   Collection Time: 11/04/2017  5:52 PM  Result Value Ref Range   Troponin I <0.03 <0.03 ng/mL    Comment: Performed at [facility], [address], [place], [place] [address]  Brain natriuretic peptide     Status: None   Collection Time: 11/15/2017  5:53 PM  Result Value Ref Range   B Natriuretic Peptide 36.0 0.0 - 100.0 pg/mL    Comment: Performed at [facility], [address], [place], [place] [address]  Influenza panel by PCR (type A & B)     Status: Abnormal   Collection Time: 11/15/2017  5:55 PM  Result Value Ref Range   Influenza A By PCR POSITIVE (A) NEGATIVE   Influenza B By PCR NEGATIVE NEGATIVE    Comment: (NOTE) The Xpert Xpress Flu  assay is intended as an aid in the diagnosis of  influenza and should not be used as a sole basis for treatment.  This  assay is FDA approved for nasopharyngeal swab specimens only. Nasal  washings and aspirates are unacceptable for Xpert Xpress Flu testing. Performed at [facility], [address] [place], [place] [address]   Lipase, blood     Status: None   Collection Time: 11/11/2017  6:06 PM  Result Value Ref Range   Lipase 19 11 - 51 U/L    Comment: Performed at [facility], [address], [place], [place] [address]  I-Stat CG4 Lactic Acid, ED  (not at  [facility])     Status: Abnormal   Collection Time: 11/20/2017  6:26 PM  Result Value Ref Range   Lactic Acid, Venous 2.81 (HH) 0.5 - 1.9 mmol/L   Comment NOTIFIED PHYSICIAN   Blood Culture (routine x 2)     Status: None (Preliminary result)   Collection Time: 11/05/2017  6:27 PM  Result Value Ref Range   Specimen Description RIGHT ANTECUBITAL    Special Requests      BOTTLES DRAWN AEROBIC AND ANAEROBIC Blood Culture adequate volume   Culture      NO GROWTH < 12 HOURS Performed at [facility], [address] [place], [place] [address]    Report Status PENDING   Blood Culture (routine x 2)     Status: None (Preliminary result)   Collection Time: 11/15/2017  6:27 PM  Result Value Ref Range   Specimen Description RIGHT ANTECUBITAL    Special Requests      BOTTLES DRAWN AEROBIC AND ANAEROBIC Blood Culture adequate volume   Culture      NO GROWTH < 12 HOURS Performed at [facility], [address], [place],  [address]    Report Status PENDING   Blood gas, arterial ([facility], [facility], [facility])     Status: Abnormal   Collection Time: 11/08/2017  7:00 PM  Result Value Ref Range   FIO2 0.50    O2 Content 50.0 L/min   Delivery systems BILEVEL POSITIVE AIRWAY PRESSURE    Inspiratory PAP 14    Expiratory PAP 5.0    pH, Arterial 7.401 7.350 - 7.450   pCO2 arterial 30.9 (L) 32.0 - 48.0 mmHg   pO2, Arterial 66.5 (L) 83.0 - 108.0 mmHg   Bicarbonate  20.7 20.0 - 28.0 mmol/L   Acid-base deficit 5.1 (H) 0.0 - 2.0 mmol/L   O2 Saturation 91.0 %   Patient temperature 37.0    Collection site LEFT RADIAL    Drawn by [phone]    Sample type ARTERIAL    Allens test (pass/fail) PASS PASS    Comment: Performed at [facility], [address] [place] [address], [place], [place] [address]  Urinalysis, Routine w reflex microscopic     Status: Abnormal   Collection Time: 11/06/2017  9:30 PM  Result Value Ref Range   Color, Urine YELLOW YELLOW   APPearance CLOUDY (A) CLEAR   Specific Gravity, Urine 1.022 1.005 - 1.030   pH 6.0 5.0 - 8.0   Glucose, UA >=500 (A) NEGATIVE mg/dL   Hgb urine dipstick SMALL (A) NEGATIVE   Bilirubin Urine NEGATIVE NEGATIVE   Ketones, ur 20 (A) NEGATIVE mg/dL   Protein, ur 100 (A) NEGATIVE mg/dL   Nitrite NEGATIVE NEGATIVE   Leukocytes, UA NEGATIVE NEGATIVE    Comment: Performed at [facility], [address] [place], [place] [address]  Urinalysis, Microscopic (reflex)     Status: Abnormal   Collection Time: 11/08/2017  9:30 PM  Result Value Ref Range   RBC / HPF 0-5 0 - 5 RBC/hpf   WBC, UA 0-5 0 - 5 WBC/hpf   Bacteria, UA RARE (A) NONE SEEN   Squamous Epithelial / LPF 6-30 (A) NONE SEEN    Comment: Performed at [facility], [address] [place], [place] [address]  I-Stat CG4 Lactic Acid, ED  (not at  [facility])     Status: None   Collection Time: 11/04/17 12:23 AM  Result Value Ref Range   Lactic Acid, Venous 1.89 0.5 - 1.9 mmol/L  MRSA PCR Screening     Status: None   Collection Time: 11/04/17  1:26 AM  Result Value Ref Range   MRSA by PCR NEGATIVE NEGATIVE    Comment:        The GeneXpert MRSA Assay (FDA approved for NASAL specimens only), is one component of a comprehensive MRSA colonization surveillance program. It is not intended to diagnose MRSA infection nor to guide or monitor treatment for MRSA infections. Performed at [facility], [address] [place], [place] [address]   Blood gas, arterial     Status: Abnormal    Collection Time: 11/04/17  2:19 AM  Result Value Ref Range   FIO2 60.00    Delivery systems BILEVEL POSITIVE AIRWAY PRESSURE    LHR 10.0 resp/min   Inspiratory PAP 14.0    Expiratory PAP 5.0    pH, Arterial 7.361 7.350 - 7.450   pCO2 arterial 33.3 32.0 - 48.0 mmHg   pO2, Arterial 62.6 (L) 83.0 - 108.0 mmHg   Bicarbonate 19.7 (L) 20.0 - 28.0 mmol/L   O2 Saturation 89.0 %   Collection site RIGHT RADIAL    Drawn by [phone]    Sample type ARTERIAL DRAW    Allens test (pass/fail) PASS PASS    Comment: Performed at [facility], [address] [place], [place] [address]  Glucose, capillary     Status: Abnormal   Collection Time: 11/04/17  2:30 AM  Result Value Ref Range   Glucose-Capillary 366 (H) 65 - 99 mg/dL   Comment 1 Notify RN   Comprehensive metabolic panel     Status: Abnormal   Collection Time: 11/04/17  4:33 AM  Result Value Ref Range   Sodium 135 135 - 145 mmol/L   Potassium  4.5 3.5 - 5.1 mmol/L   Chloride 102 101 - 111 mmol/L   CO2 15 (L) 22 - 32 mmol/L   Glucose, Bld 453 (H) 65 - 99 mg/dL   BUN 27 (H) 6 - 20 mg/dL   Creatinine, Ser 0.95 0.44 - 1.00 mg/dL   Calcium 8.2 (L) 8.9 - 10.3 mg/dL   Total Protein 7.3 6.5 - 8.1 g/dL   Albumin 3.0 (L) 3.5 - 5.0 g/dL   AST 42 (H) 15 - 41 U/L   ALT 25 14 - 54 U/L   Alkaline Phosphatase 67 38 - 126 U/L   Total Bilirubin 1.2 0.3 - 1.2 mg/dL   GFR calc non Af Amer >60 >60 mL/min   GFR calc Af Amer >60 >60 mL/min    Comment: (NOTE) The eGFR has been calculated using the CKD EPI equation. This calculation has not been validated in all clinical situations. eGFR's persistently <60 mL/min signify possible Chronic Kidney Disease.    Anion gap 18 (H) 5 - 15    Comment: Performed at [facility], [address], [place], [place] [address]  CBC     Status: Abnormal   Collection Time: 11/04/17  4:33 AM  Result Value Ref Range   WBC 11.4 (H) 4.0 - 10.5 K/uL   RBC 5.03 3.87 - 5.11 MIL/uL   Hemoglobin 15.6 (H) 12.0 - 15.0 g/dL   HCT  47.8 (H) 36.0 - 46.0 %   MCV 95.0 78.0 - 100.0 fL   MCH 31.0 26.0 - 34.0 pg   MCHC 32.6 30.0 - 36.0 g/dL   RDW 14.2 11.5 - 15.5 %   Platelets 118 (L) 150 - 400 K/uL    Comment: CONSISTENT WITH PREVIOUS RESULT Performed at [facility], [address] [place], [place] [address]   Triglycerides     Status: Abnormal   Collection Time: 11/04/17  4:33 AM  Result Value Ref Range   Triglycerides 328 (H) <150 mg/dL    Comment: Performed at [facility], [address] [place], [place] [address]  Glucose, capillary     Status: Abnormal   Collection Time: 11/04/17  7:41 AM  Result Value Ref Range   Glucose-Capillary 466 (H) 65 - 99 mg/dL  Glucose, capillary     Status: Abnormal   Collection Time: 11/04/17  8:46 AM  Result Value Ref Range   Glucose-Capillary 483 (H) 65 - 99 mg/dL  Basic metabolic panel     Status: Abnormal   Collection Time: 11/04/17  8:51 AM  Result Value Ref Range   Sodium 135 135 - 145 mmol/L   Potassium 4.4 3.5 - 5.1 mmol/L   Chloride 102 101 - 111 mmol/L   CO2 15 (L) 22 - 32 mmol/L   Glucose, Bld 482 (H) 65 - 99 mg/dL   BUN 33 (H) 6 - 20 mg/dL   Creatinine, Ser 1.23 (H) 0.44 - 1.00 mg/dL   Calcium 8.4 (L) 8.9 - 10.3 mg/dL   GFR calc non Af Amer 50 (L) >60 mL/min   GFR calc Af Amer 58 (L) >60 mL/min    Comment: (NOTE) The eGFR has been calculated using the CKD EPI equation. This calculation has not been validated in all clinical situations. eGFR's persistently <60 mL/min signify possible Chronic Kidney Disease.    Anion gap 18 (H) 5 - 15    Comment: Performed at [facility], [address], [place], [place] [address]  Hemoglobin A1c     Status: Abnormal   Collection Time:  11/04/17  8:51 AM  Result Value Ref Range   Hgb A1c MFr Bld 8.8 (H) 4.8 - 5.6 %    Comment: (NOTE) Pre diabetes:          5.7%-6.4% Diabetes:              >6.4% Glycemic control for   <7.0% adults with diabetes    Mean Plasma Glucose 205.86 mg/dL    Comment: Performed at [facility] [address] [place], [place] [address]  Lactic acid, plasma     Status: Abnormal   Collection Time: 11/04/17  8:51 AM  Result Value Ref Range   Lactic Acid, Venous 2.3 (HH) 0.5 - 1.9 mmol/L    Comment: CRITICAL RESULT CALLED TO, READ BACK BY AND VERIFIED WITH: MOTLEY,J AT 9:50AM ON 11/04/17 BY [facility] Performed at [facility], [address] [place], [place] [address]   Glucose, capillary     Status: Abnormal   Collection Time: 11/04/17  9:53 AM  Result Value Ref Range   Glucose-Capillary 436 (H) 65 - 99 mg/dL  Draw ABG 1 hour after initiation of ventilator     Status: Abnormal   Collection Time: 11/04/17 10:15 AM  Result Value Ref Range   FIO2 70.00    Delivery systems VENTILATOR    Mode PRESSURE REGULATED VOLUME CONTROL    VT 500 mL   LHR 22 resp/min   Peep/cpap 5.0 cm H20   pH, Arterial 7.368 7.350 - 7.450   pCO2 arterial 26.8 (L) 32.0 - 48.0 mmHg   pO2, Arterial 99.1 83.0 - 108.0 mmHg   Bicarbonate 17.8 (L) 20.0 - 28.0 mmol/L   Acid-base deficit 9.2 (H) 0.0 - 2.0 mmol/L   O2 Saturation 96.6 %   Patient temperature 37.4    Collection site RIGHT RADIAL    Drawn by [id number]    Sample type ARTERIAL DRAW    Allens test (pass/fail) PASS PASS    Comment: Performed at [facility], [address], [place], [place] [address]  Glucose, capillary     Status: Abnormal   Collection Time: 11/04/17 10:47 AM  Result Value Ref Range   Glucose-Capillary 432 (H) 65 - 99 mg/dL  Glucose, capillary     Status: Abnormal   Collection Time: 11/04/17 11:53 AM  Result Value Ref Range   Glucose-Capillary 394 (H) 65 - 99 mg/dL  Basic metabolic panel     Status: Abnormal   Collection Time: 11/04/17 12:19 PM  Result Value Ref Range   Sodium 135 135 - 145 mmol/L   Potassium 3.8 3.5 - 5.1 mmol/L   Chloride 104 101 - 111 mmol/L   CO2 16 (L) 22 - 32 mmol/L   Glucose, Bld 403 (H) 65 - 99 mg/dL   BUN 38 (H) 6 - 20 mg/dL   Creatinine, Ser 1.23 (H) 0.44 - 1.00 mg/dL   Calcium 8.4 (L)  8.9 - 10.3 mg/dL   GFR calc non Af Amer 50 (L) >60 mL/min   GFR calc Af Amer 58 (L) >60 mL/min    Comment: (NOTE) The eGFR has been calculated using the CKD EPI equation. This calculation has not been validated in all clinical situations. eGFR's persistently <60 mL/min signify possible Chronic Kidney Disease.    Anion gap 15 5 - 15    Comment: Performed at [facility], [address] [place],  [address]  Lactic acid, plasma     Status: Abnormal   Collection Time: 11/04/17 12:19 PM  Result  Value Ref Range   Lactic Acid, Venous 2.2 (HH) 0.5 - 1.9 mmol/L    Comment: CRITICAL RESULT CALLED TO, READ BACK BY AND VERIFIED WITH: MOTLEY,J AT 1330 ON 2.12.2019 BY ISLEY,B Performed at [facility], [address], [place], [place] [address]   Glucose, capillary     Status: Abnormal   Collection Time: 11/04/17  1:05 PM  Result Value Ref Range   Glucose-Capillary 385 (H) 65 - 99 mg/dL  Glucose, capillary     Status: Abnormal   Collection Time: 11/04/17  2:15 PM  Result Value Ref Range   Glucose-Capillary 313 (H) 65 - 99 mg/dL  Glucose, capillary     Status: Abnormal   Collection Time: 11/04/17  3:05 PM  Result Value Ref Range   Glucose-Capillary 316 (H) 65 - 99 mg/dL  Glucose, capillary     Status: Abnormal   Collection Time: 11/04/17  4:15 PM  Result Value Ref Range   Glucose-Capillary 271 (H) 65 - 99 mg/dL  Basic metabolic panel     Status: Abnormal   Collection Time: 11/04/17  4:19 PM  Result Value Ref Range   Sodium 138 135 - 145 mmol/L   Potassium 3.5 3.5 - 5.1 mmol/L   Chloride 108 101 - 111 mmol/L   CO2 17 (L) 22 - 32 mmol/L   Glucose, Bld 265 (H) 65 - 99 mg/dL   BUN 40 (H) 6 - 20 mg/dL   Creatinine, Ser 1.28 (H) 0.44 - 1.00 mg/dL   Calcium 8.7 (L) 8.9 - 10.3 mg/dL   GFR calc non Af Amer 48 (L) >60 mL/min   GFR calc Af Amer 55 (L) >60 mL/min    Comment: (NOTE) The eGFR has been calculated using the CKD EPI equation. This calculation has not been validated in all  clinical situations. eGFR's persistently <60 mL/min signify possible Chronic Kidney Disease.    Anion gap 13 5 - 15    Comment: Performed at [facility], [address], [place], [place] [address]  Troponin I (q 6hr x 3)     Status: Abnormal   Collection Time: 11/04/17  4:19 PM  Result Value Ref Range   Troponin I 0.90 (HH) <0.03 ng/mL    Comment: CRITICAL RESULT CALLED TO, READ BACK BY AND VERIFIED WITH: DAVIS,C ON 11/04/17 AT 1720 BY LOY,C Performed at [facility], [address], [place], [place] [address]   Brain natriuretic peptide     Status: Abnormal   Collection Time: 11/04/17  4:19 PM  Result Value Ref Range   B Natriuretic Peptide 368.0 (H) 0.0 - 100.0 pg/mL    Comment: Performed at [facility], [address] [place], [place] [address]  Glucose, capillary     Status: Abnormal   Collection Time: 11/04/17  5:26 PM  Result Value Ref Range   Glucose-Capillary 211 (H) 65 - 99 mg/dL  Glucose, capillary     Status: Abnormal   Collection Time: 11/04/17  6:21 PM  Result Value Ref Range   Glucose-Capillary 228 (H) 65 - 99 mg/dL  Glucose, capillary     Status: Abnormal   Collection Time: 11/04/17  7:25 PM  Result Value Ref Range   Glucose-Capillary 189 (H) 65 - 99 mg/dL   Comment 1 Notify RN   Glucose, capillary     Status: Abnormal   Collection Time: 11/04/17  8:37 PM  Result Value Ref Range   Glucose-Capillary 152 (H) 65 - 99 mg/dL   Comment 1 Notify RN  Basic metabolic panel     Status: Abnormal   Collection Time: 11/04/17  9:08 PM  Result Value Ref Range   Sodium 137 135 - 145 mmol/L   Potassium 3.6 3.5 - 5.1 mmol/L   Chloride 106 101 - 111 mmol/L   CO2 18 (L) 22 - 32 mmol/L   Glucose, Bld 134 (H) 65 - 99 mg/dL   BUN 41 (H) 6 - 20 mg/dL   Creatinine, Ser 1.31 (H) 0.44 - 1.00 mg/dL   Calcium 8.5 (L) 8.9 - 10.3 mg/dL   GFR calc non Af Amer 46 (L) >60 mL/min   GFR calc Af Amer 54 (L) >60 mL/min    Comment: (NOTE) The eGFR has been calculated using the CKD EPI  equation. This calculation has not been validated in all clinical situations. eGFR's persistently <60 mL/min signify possible Chronic Kidney Disease.    Anion gap 13 5 - 15    Comment: Performed at [facility], [address], [place], [place] [address]  Troponin I (q 6hr x 3)     Status: Abnormal   Collection Time: 11/04/17  9:08 PM  Result Value Ref Range   Troponin I 1.04 (HH) <0.03 ng/mL    Comment: CRITICAL VALUE NOTED.  VALUE IS CONSISTENT WITH PREVIOUSLY REPORTED AND CALLED VALUE. Performed at [facility], [address], [place],  [address]   Glucose, capillary     Status: Abnormal   Collection Time: 11/04/17  9:33 PM  Result Value Ref Range   Glucose-Capillary 154 (H) 65 - 99 mg/dL   Comment 1 Notify RN   Glucose, capillary     Status: Abnormal   Collection Time: 11/04/17 10:30 PM  Result Value Ref Range   Glucose-Capillary 174 (H) 65 - 99 mg/dL   Comment 1 Notify RN   Glucose, capillary     Status: Abnormal   Collection Time: 11/04/17 11:41 PM  Result Value Ref Range   Glucose-Capillary 181 (H) 65 - 99 mg/dL   Comment 1 Notify RN   Basic metabolic panel     Status: Abnormal   Collection Time: 11/04/17 11:58 PM  Result Value Ref Range   Sodium 136 135 - 145 mmol/L   Potassium 3.5 3.5 - 5.1 mmol/L   Chloride 106 101 - 111 mmol/L   CO2 19 (L) 22 - 32 mmol/L   Glucose, Bld 168 (H) 65 - 99 mg/dL   BUN 44 (H) 6 - 20 mg/dL   Creatinine, Ser 1.44 (H) 0.44 - 1.00 mg/dL   Calcium 8.5 (L) 8.9 - 10.3 mg/dL   GFR calc non Af Amer 41 (L) >60 mL/min   GFR calc Af Amer 48 (L) >60 mL/min    Comment: (NOTE) The eGFR has been calculated using the CKD EPI equation. This calculation has not been validated in all clinical situations. eGFR's persistently <60 mL/min signify possible Chronic Kidney Disease.    Anion gap 11 5 - 15    Comment: Performed at [facility], [address], [place],  [address]  Glucose, capillary     Status: Abnormal   Collection Time:  11/05/17 12:35 AM  Result Value Ref Range   Glucose-Capillary 150 (H) 65 - 99 mg/dL   Comment 1 Notify RN   Glucose, capillary     Status: Abnormal   Collection Time: 11/05/17  1:33 AM  Result Value Ref Range   Glucose-Capillary 128 (H) 65 - 99 mg/dL   Comment 1 Notify RN   Glucose, capillary  Status: Abnormal   Collection Time: 11/05/17  2:32 AM  Result Value Ref Range   Glucose-Capillary 148 (H) 65 - 99 mg/dL  Glucose, capillary     Status: Abnormal   Collection Time: 11/05/17  3:32 AM  Result Value Ref Range   Glucose-Capillary 135 (H) 65 - 99 mg/dL   Comment 1 Notify RN   Glucose, capillary     Status: Abnormal   Collection Time: 11/05/17  4:34 AM  Result Value Ref Range   Glucose-Capillary 149 (H) 65 - 99 mg/dL   Comment 1 Notify RN   Draw ABG 1 hour after initiation of ventilator     Status: Abnormal   Collection Time: 11/05/17  4:45 AM  Result Value Ref Range   FIO2 60.00    Delivery systems VENTILATOR    Mode PRESSURE REGULATED VOLUME CONTROL    VT 500 mL   LHR 22.0 resp/min   Peep/cpap 5.0 cm H20   pH, Arterial 7.436 7.350 - 7.450   pCO2 arterial 29.1 (L) 32.0 - 48.0 mmHg   pO2, Arterial 92.6 83.0 - 108.0 mmHg   Bicarbonate 22.1 20.0 - 28.0 mmol/L   Acid-Base Excess 22.1 (H) 0.0 - 2.0 mmol/L   O2 Saturation 96.1 %   Collection site LEFT RADIAL    Drawn by [id number]    Sample type ARTERIAL DRAW    Allens test (pass/fail) PASS PASS    Comment: Performed at [facility], [address] [place], [place] [address]  Basic metabolic panel     Status: Abnormal   Collection Time: 11/05/17  4:58 AM  Result Value Ref Range   Sodium 142 135 - 145 mmol/L   Potassium 3.4 (L) 3.5 - 5.1 mmol/L   Chloride 109 101 - 111 mmol/L   CO2 19 (L) 22 - 32 mmol/L   Glucose, Bld 158 (H) 65 - 99 mg/dL   BUN 48 (H) 6 - 20 mg/dL   Creatinine, Ser 1.48 (H) 0.44 - 1.00 mg/dL   Calcium 8.6 (L) 8.9 - 10.3 mg/dL   GFR calc non Af Amer 40 (L) >60 mL/min   GFR calc Af Amer 46 (L) >60 mL/min     Comment: (NOTE) The eGFR has been calculated using the CKD EPI equation. This calculation has not been validated in all clinical situations. eGFR's persistently <60 mL/min signify possible Chronic Kidney Disease.    Anion gap 14 5 - 15    Comment: Performed at [facility], [address], [place], [place] [address]  Troponin I     Status: Abnormal   Collection Time: 11/05/17  4:58 AM  Result Value Ref Range   Troponin I 1.26 (HH) <0.03 ng/mL    Comment: CRITICAL RESULT CALLED TO, READ BACK BY AND VERIFIED WITH: WAGONER,R AT 6:00AM ON 11/05/17 BY [facility] Performed at [facility], [address], [place], [place] [address]   Glucose, capillary     Status: Abnormal   Collection Time: 11/05/17  6:22 AM  Result Value Ref Range   Glucose-Capillary 143 (H) 65 - 99 mg/dL   Comment 1 Notify RN     ABGS Recent Labs    11/05/17 0445  PHART 7.436  PO2ART 92.6  HCO3 22.1   CULTURES Recent Results (from the past 240 hour(s))  Blood Culture (routine x 2)     Status: None (Preliminary result)   Collection Time: 10/24/2017  6:27 PM  Result Value Ref Range Status   Specimen Description RIGHT ANTECUBITAL  Final  Special Requests   Final    BOTTLES DRAWN AEROBIC AND ANAEROBIC Blood Culture adequate volume   Culture   Final    NO GROWTH < 12 HOURS Performed at [facility], [address] [place], [place] [address]    Report Status PENDING  Incomplete  Blood Culture (routine x 2)     Status: None (Preliminary result)   Collection Time: 11/14/2017  6:27 PM  Result Value Ref Range Status   Specimen Description RIGHT ANTECUBITAL  Final   Special Requests   Final    BOTTLES DRAWN AEROBIC AND ANAEROBIC Blood Culture adequate volume   Culture   Final    NO GROWTH < 12 HOURS Performed at [facility], [address] [place], [place] [address]    Report Status PENDING  Incomplete  MRSA PCR Screening     Status: None   Collection Time: 11/04/17  1:26 AM  Result Value Ref Range Status    MRSA by PCR NEGATIVE NEGATIVE Final    Comment:        The GeneXpert MRSA Assay (FDA approved for NASAL specimens only), is one component of a comprehensive MRSA colonization surveillance program. It is not intended to diagnose MRSA infection nor to guide or monitor treatment for MRSA infections. Performed at [facility] [facility], [address] [place], [place] [address]    Studies/Results: Dg Chest 1v Repeat Same Day  Result Date: 11/04/2017 CLINICAL DATA:  Orogastric tube placement. EXAM: CHEST - 1 VIEW SAME DAY COMPARISON:  Chest radiograph performed earlier today at 3:20 a.m. FINDINGS: The patient's endotracheal tube is seen ending 5 cm above the carina. An enteric tube is noted extending below the diaphragm. Vascular congestion is again noted. Bilateral airspace opacification is again noted, concerning for pneumonia, though underlying pulmonary edema may be present. No pleural effusion or pneumothorax is seen. The cardiomediastinal silhouette is borderline normal in size. A pacemaker is noted overlying the left chest wall, with leads ending overlying the right atrium and right ventricle. No acute osseous abnormalities are identified. IMPRESSION: 1. Enteric tube noted extending below the diaphragm. 2. Endotracheal tube seen ending 5 cm above the carina. 3. Vascular congestion. Persistent bilateral airspace opacification is concerning for pneumonia, though underlying pulmonary edema may be present. Electronically Signed   By: Garald Balding M.D.   On: 11/04/2017 03:59   Dg Chest Port 1 View  Result Date: 11/04/2017 CLINICAL DATA:  Endotracheal tube placement. EXAM: PORTABLE CHEST 1 VIEW COMPARISON:  Chest radiograph performed 11/10/2017 FINDINGS: The patient's endotracheal tube is seen ending 2 cm above the carina. Bilateral airspace opacification raises concern for pneumonia. Underlying pulmonary edema may be present. No pleural effusion or pneumothorax is seen. The cardiomediastinal silhouette  is borderline normal in size. A pacemaker is noted overlying the left chest wall, with leads ending overlying the right atrium and right ventricle. No acute osseous abnormalities are seen. IMPRESSION: 1. Endotracheal tube seen ending 2 cm above the carina. 2. Bilateral airspace opacification raises concern for pneumonia. Underlying pulmonary edema may be present. Electronically Signed   By: Garald Balding M.D.   On: 11/04/2017 03:36   Dg Chest Port 1 View  Result Date: 11/15/2017 CLINICAL DATA:  52 year old female with a history of abdominal pain and cough EXAM: PORTABLE CHEST 1 VIEW COMPARISON:  09/21/2017 FINDINGS: Cardiomediastinal silhouette unchanged in size and contour. Fullness in the central vasculature. Coarsened interstitial markings bilaterally. No pleural effusion. No pneumothorax. Cardiac pacing device on left chest wall. IMPRESSION: Coarsened interstitial markings,  may reflect atypical infection or developing edema. Cardiac pacing device on the left chest wall. Electronically Signed   By: Corrie Mckusick D.O.   On: 11/04/2017 18:29    Medications:  Prior to Admission:  Medications Prior to Admission  Medication Sig Dispense Refill Last Dose  . acetaminophen (TYLENOL) 500 MG tablet Take 500 mg by mouth every 6 (six) hours as needed for mild pain or moderate pain.   11/02/2017 at Unknown time  . aspirin EC 81 MG tablet Take 81 mg by mouth daily.   11/07/2017 at Unknown time  . carvedilol (COREG) 6.25 MG tablet Take 1 tablet (6.25 mg total) by mouth 2 (two) times daily. 180 tablet 3 11/12/2017 at Unknown time  . Cholecalciferol (VITAMIN D3) 5000 units CAPS Take 1 capsule (5,000 Units total) by mouth daily. 90 capsule 0 11/18/2017 at Unknown time  . clopidogrel (PLAVIX) 75 MG tablet Take 75 mg by mouth daily.   11/02/2017 at Unknown time  . diclofenac sodium (VOLTAREN) 1 % GEL Apply 4 g topically 4 (four) times daily. (Patient taking differently: Apply 2-4 g topically daily as needed (for leg  pain). ) 200 g 11 Past Month at Unknown time  . dicyclomine (BENTYL) 20 MG tablet Take 1 tablet (20 mg total) by mouth every 8 (eight) hours as needed for spasms. 90 tablet 3 11/20/2017 at Unknown time  . diphenoxylate-atropine (LOMOTIL) 2.5-0.025 MG tablet Take 2 tabs by mouth x 1, then 1 tab every 4 hrs PRN, max of 8 tabs in 24 hour time 240 tablet 1 Past Week at Unknown time  . DULoxetine (CYMBALTA) 30 MG capsule Take 90 mg daily (60 mg + 30 mg) (Patient taking differently: Take 30 mg by mouth daily. ) 30 capsule 0 10/27/2017 at Unknown time  . DULoxetine (CYMBALTA) 60 MG capsule Take 90 mg daily (60 mg + 30 mg) 30 capsule 0 11/02/2017 at Unknown time  . furosemide (LASIX) 40 MG tablet TAKE (1) TABLET BY MOUTH EACH MORNING. 90 tablet 3 11/19/2017 at Unknown time  . ibuprofen (ADVIL,MOTRIN) 600 MG tablet Take 600 mg by mouth 2 (two) times daily as needed for moderate pain.    11/08/2017 at Unknown time  . insulin aspart (NOVOLOG FLEXPEN) 100 UNIT/ML FlexPen Inject 15-21 Units into the skin 3 (three) times daily with meals. 5 pen 2 10/30/2017 at Unknown time  . INVOKANA 100 MG TABS tablet TAKE 1 TABLET BY MOUTH DAILY BEFORE BREAKFAST. 30 tablet 2 11/13/2017 at Unknown time  . LEVEMIR FLEXTOUCH 100 UNIT/ML Pen INJECT 80 UNITS UNDER THE SKIN EVERY DAY AT 10 PM 30 mL 2 11/02/2017 at Unknown time  . lisinopril (PRINIVIL,ZESTRIL) 5 MG tablet Take 5 mg by mouth daily.   11/01/2017 at Unknown time  . LORazepam (ATIVAN) 0.5 MG tablet Take 1 tablet (0.5 mg total) by mouth daily as needed for anxiety. 30 tablet 0 Past Week at Unknown time  . LYRICA 150 MG capsule take 144m by mouth three times daily  0 11/20/2017 at Unknown time  . meloxicam (MOBIC) 15 MG tablet Take 15 mg by mouth daily.   11/18/2017 at Unknown time  . metFORMIN (GLUCOPHAGE) 1000 MG tablet TAKE 1 TABLET(1000 MG) BY MOUTH TWICE DAILY WITH A MEAL 60 tablet 3 10/29/2017 at Unknown time  . nitroGLYCERIN (NITROSTAT) 0.4 MG SL tablet Place 1 tablet (0.4 mg  total) under the tongue every 5 (five) minutes as needed for chest pain. 30 tablet 0 unknown  . pantoprazole (PROTONIX) 40 MG  tablet Take 1 tablet (40 mg total) by mouth 2 (two) times daily. 60 tablet 0 11/11/2017 at Unknown time  . potassium chloride (K-DUR) 10 MEQ tablet Take 1 tablet (10 mEq total) by mouth daily. 90 tablet 3 11/19/2017 at Unknown time  . pramipexole (MIRAPEX) 0.25 MG tablet Take 0.25 mg by mouth 2 (two) times daily.    11/04/2017 at Unknown time  . simvastatin (ZOCOR) 10 MG tablet TAKE (1) TABLET BY MOUTH DAILY. (Patient taking differently: TAKE (2) TABLET BY MOUTH DAILY.) 90 tablet 2 11/20/2017 at Unknown time  . tapentadol (NUCYNTA ER) 100 MG 12 hr tablet Take 100 mg by mouth every 12 (twelve) hours.   11/02/2017 at Unknown time  . tiZANidine (ZANAFLEX) 4 MG capsule Take 4 mg by mouth 3 (three) times daily.   10/24/2017 at Unknown time  . albuterol (PROVENTIL) (2.5 MG/3ML) 0.083% nebulizer solution Take 3 mLs (2.5 mg total) by nebulization every 6 (six) hours as needed for wheezing or shortness of breath. 75 mL 12 unknown  . colestipol (COLESTID) 1 g tablet Take 1 tablet (1 g total) by mouth 2 (two) times daily. (Patient not taking: Reported on 11/12/2017) 90 tablet 3 unknown  . inFLIXimab 500 mg in sodium chloride 0.9 % 200 mL Inject 500 mg into the vein every 8 (eight) weeks. 1 Dose 6 Taking  . OXYGEN Inhale 3 L/day into the lungs at bedtime.   Taking   Scheduled: . aspirin EC  81 mg Oral Daily  . budesonide (PULMICORT) nebulizer solution  0.5 mg Nebulization BID  . chlorhexidine gluconate (MEDLINE KIT)  15 mL Mouth Rinse BID  . chlorhexidine gluconate (MEDLINE KIT)  15 mL Mouth Rinse BID  . clopidogrel  75 mg Oral Daily  . enoxaparin (LOVENOX) injection  40 mg Subcutaneous Q24H  . furosemide  40 mg Intravenous Q12H  . ipratropium  0.5 mg Nebulization QID  . levalbuterol  0.63 mg Nebulization Q6H  . mouth rinse  15 mL Mouth Rinse QID  . mouth rinse  15 mL Mouth Rinse QID  .  methylPREDNISolone (SOLU-MEDROL) injection  80 mg Intravenous Q8H  . oseltamivir  75 mg Oral BID  . pantoprazole (PROTONIX) IV  40 mg Intravenous QHS  . potassium chloride  10 mEq Oral Daily  . sodium chloride flush  3 mL Intravenous Q12H  . tapentadol  100 mg Oral Q12H   Continuous: . sodium chloride    . sodium chloride    . ceFEPime (MAXIPIME) IV Stopped (11/05/17 0142)  . dextrose 5 % and 0.45% NaCl 50 mL/hr at 11/04/17 1730  . insulin (NOVOLIN-R) infusion 6.4 Units/hr (11/05/17 6644)  . levofloxacin (LEVAQUIN) IV Stopped (11/05/17 0020)  . propofol (DIPRIVAN) infusion 55 mcg/kg/min (11/05/17 0442)  . vancomycin Stopped (11/05/17 0630)   IHK:VQQVZD chloride, acetaminophen **OR** acetaminophen, fentaNYL (SUBLIMAZE) injection, fentaNYL (SUBLIMAZE) injection, levalbuterol, LORazepam, sodium chloride flush  Assesment: She has acute hypoxic respiratory failure.  She is on the ventilator.  She has been able to wean down to 60% oxygen .    cxr that I personally reviewed shows diffuse bilateral infiltrates.  She has cardiomyopathy and is receiving lasix.  dka being treated with insulin drip.   She still has relatively soft blood pressures but not requiring pressors   Plan: Continue IV antibiotics.  Continue steroids.  Not ready for weaning.    LOS: 2 days   Kimmi Acocella L 11/05/2017, 7:28 AM

## 2017-11-05 NOTE — Progress Notes (Signed)
CVP 6-7 in ventilated patient, venous O2 saturation 73%. Both numbers look good, if anything CVP may be a little low in ventilated patient. Would suggest her AKI may be prerenal and supports the holding of her diuretics that has been done.    Dina Rich MD

## 2017-11-06 ENCOUNTER — Inpatient Hospital Stay (HOSPITAL_COMMUNITY): Payer: Medicaid Other

## 2017-11-06 DIAGNOSIS — E1142 Type 2 diabetes mellitus with diabetic polyneuropathy: Secondary | ICD-10-CM

## 2017-11-06 DIAGNOSIS — F17200 Nicotine dependence, unspecified, uncomplicated: Secondary | ICD-10-CM

## 2017-11-06 DIAGNOSIS — J449 Chronic obstructive pulmonary disease, unspecified: Secondary | ICD-10-CM

## 2017-11-06 DIAGNOSIS — G4733 Obstructive sleep apnea (adult) (pediatric): Secondary | ICD-10-CM

## 2017-11-06 DIAGNOSIS — E871 Hypo-osmolality and hyponatremia: Secondary | ICD-10-CM

## 2017-11-06 LAB — GLUCOSE, CAPILLARY
GLUCOSE-CAPILLARY: 131 mg/dL — AB (ref 65–99)
GLUCOSE-CAPILLARY: 151 mg/dL — AB (ref 65–99)
GLUCOSE-CAPILLARY: 157 mg/dL — AB (ref 65–99)
GLUCOSE-CAPILLARY: 160 mg/dL — AB (ref 65–99)
GLUCOSE-CAPILLARY: 165 mg/dL — AB (ref 65–99)
GLUCOSE-CAPILLARY: 166 mg/dL — AB (ref 65–99)
GLUCOSE-CAPILLARY: 205 mg/dL — AB (ref 65–99)
GLUCOSE-CAPILLARY: 226 mg/dL — AB (ref 65–99)
Glucose-Capillary: 146 mg/dL — ABNORMAL HIGH (ref 65–99)
Glucose-Capillary: 153 mg/dL — ABNORMAL HIGH (ref 65–99)
Glucose-Capillary: 154 mg/dL — ABNORMAL HIGH (ref 65–99)
Glucose-Capillary: 156 mg/dL — ABNORMAL HIGH (ref 65–99)
Glucose-Capillary: 159 mg/dL — ABNORMAL HIGH (ref 65–99)
Glucose-Capillary: 168 mg/dL — ABNORMAL HIGH (ref 65–99)
Glucose-Capillary: 170 mg/dL — ABNORMAL HIGH (ref 65–99)
Glucose-Capillary: 171 mg/dL — ABNORMAL HIGH (ref 65–99)
Glucose-Capillary: 171 mg/dL — ABNORMAL HIGH (ref 65–99)
Glucose-Capillary: 178 mg/dL — ABNORMAL HIGH (ref 65–99)
Glucose-Capillary: 180 mg/dL — ABNORMAL HIGH (ref 65–99)
Glucose-Capillary: 184 mg/dL — ABNORMAL HIGH (ref 65–99)

## 2017-11-06 LAB — BASIC METABOLIC PANEL
Anion gap: 12 (ref 5–15)
BUN: 51 mg/dL — ABNORMAL HIGH (ref 6–20)
CALCIUM: 8.7 mg/dL — AB (ref 8.9–10.3)
CHLORIDE: 110 mmol/L (ref 101–111)
CO2: 18 mmol/L — ABNORMAL LOW (ref 22–32)
CREATININE: 1.33 mg/dL — AB (ref 0.44–1.00)
GFR, EST AFRICAN AMERICAN: 53 mL/min — AB (ref >60)
GFR, EST NON AFRICAN AMERICAN: 45 mL/min — AB (ref >60)
Glucose, Bld: 174 mg/dL — ABNORMAL HIGH (ref 65–99)
Potassium: 3.2 mmol/L — ABNORMAL LOW (ref 3.5–5.1)
SODIUM: 140 mmol/L (ref 135–145)

## 2017-11-06 LAB — COOXEMETRY PANEL
Carboxyhemoglobin: 0.5 % (ref 0.5–1.5)
METHEMOGLOBIN: 1 % (ref 0.0–1.5)
O2 Saturation: 69.1 %
TOTAL OXYGEN CONTENT: 19.8 mL/dL (ref 15.0–23.0)
Total hemoglobin: 20.8 g/dL — ABNORMAL HIGH (ref 12.0–16.0)

## 2017-11-06 LAB — CBC
HEMATOCRIT: 38.4 % (ref 36.0–46.0)
Hemoglobin: 12.6 g/dL (ref 12.0–15.0)
MCH: 30.4 pg (ref 26.0–34.0)
MCHC: 32.8 g/dL (ref 30.0–36.0)
MCV: 92.5 fL (ref 78.0–100.0)
PLATELETS: 156 10*3/uL (ref 150–400)
RBC: 4.15 MIL/uL (ref 3.87–5.11)
RDW: 14.7 % (ref 11.5–15.5)
WBC: 12.9 10*3/uL — AB (ref 4.0–10.5)

## 2017-11-06 LAB — BLOOD GAS, ARTERIAL
BICARBONATE: 19.3 mmol/L — AB (ref 20.0–28.0)
DRAWN BY: 317771
FIO2: 55
MECHVT: 500 mL
O2 Saturation: 94 %
PEEP: 5 cmH2O
PH ART: 7.378 (ref 7.350–7.450)
RATE: 22 resp/min
pCO2 arterial: 29.4 mmHg — ABNORMAL LOW (ref 32.0–48.0)
pO2, Arterial: 78.5 mmHg — ABNORMAL LOW (ref 83.0–108.0)

## 2017-11-06 LAB — TROPONIN I: Troponin I: 1.47 ng/mL (ref <0.03)

## 2017-11-06 LAB — UREA NITROGEN, URINE: Urea Nitrogen, Ur: 653 mg/dL

## 2017-11-06 LAB — VANCOMYCIN, TROUGH: VANCOMYCIN TR: 38 ug/mL — AB (ref 15–20)

## 2017-11-06 LAB — HEPARIN LEVEL (UNFRACTIONATED): HEPARIN UNFRACTIONATED: 0.36 [IU]/mL (ref 0.30–0.70)

## 2017-11-06 LAB — MAGNESIUM: MAGNESIUM: 1.9 mg/dL (ref 1.7–2.4)

## 2017-11-06 MED ORDER — PRO-STAT SUGAR FREE PO LIQD
60.0000 mL | Freq: Three times a day (TID) | ORAL | Status: DC
  Administered 2017-11-06 – 2017-11-15 (×21): 60 mL
  Filled 2017-11-06 (×21): qty 60

## 2017-11-06 MED ORDER — VITAL HIGH PROTEIN PO LIQD
1000.0000 mL | ORAL | Status: DC
  Administered 2017-11-06 – 2017-11-13 (×8): 1000 mL
  Filled 2017-11-06 (×9): qty 1000

## 2017-11-06 MED ORDER — POTASSIUM CHLORIDE 10 MEQ/100ML IV SOLN
10.0000 meq | INTRAVENOUS | Status: AC
  Administered 2017-11-06 (×5): 10 meq via INTRAVENOUS
  Filled 2017-11-06 (×5): qty 100

## 2017-11-06 MED ORDER — POTASSIUM CHLORIDE CRYS ER 10 MEQ PO TBCR
10.0000 meq | EXTENDED_RELEASE_TABLET | Freq: Every day | ORAL | Status: DC
  Administered 2017-11-07 – 2017-11-12 (×3): 10 meq via ORAL
  Filled 2017-11-06 (×4): qty 1

## 2017-11-06 NOTE — Progress Notes (Signed)
Progress Note  Patient Name: [name] Date of Encounter: 11/06/2017  Primary Cardiologist: n/a  Subjective   No events overnight.   Inpatient Medications    Scheduled Meds: . aspirin EC  81 mg Oral Daily  . budesonide (PULMICORT) nebulizer solution  0.5 mg Nebulization BID  . chlorhexidine gluconate (MEDLINE KIT)  15 mL Mouth Rinse BID  . Chlorhexidine Gluconate Cloth  6 each Topical Daily  . clopidogrel  75 mg Oral Daily  . ipratropium  0.5 mg Nebulization Q6H  . levalbuterol  0.63 mg Nebulization Q6H  . mouth rinse  15 mL Mouth Rinse 10 times per day  . methylPREDNISolone (SOLU-MEDROL) injection  80 mg Intravenous Q8H  . oseltamivir  75 mg Oral BID  . pantoprazole (PROTONIX) IV  40 mg Intravenous QHS  . [START ON 11/07/2017] potassium chloride  10 mEq Oral Daily  . sodium chloride flush  10-40 mL Intracatheter Q12H  . tapentadol  100 mg Oral Q12H   Continuous Infusions: . sodium chloride    . ceFEPime (MAXIPIME) IV Stopped (11/06/17 0236)  . dextrose 5 % and 0.45% NaCl 50 mL/hr at 11/06/17 0400  . heparin 1,050 Units/hr (11/06/17 0649)  . insulin (NOVOLIN-R) infusion 7.7 mL/hr at 11/06/17 0758  . levofloxacin (LEVAQUIN) IV Stopped (11/05/17 2145)  . potassium chloride 10 mEq (11/06/17 0759)  . propofol (DIPRIVAN) infusion 55 mcg/kg/min (11/06/17 0530)  . vancomycin Stopped (11/05/17 1953)   PRN Meds: acetaminophen **OR** acetaminophen, fentaNYL (SUBLIMAZE) injection, fentaNYL (SUBLIMAZE) injection, levalbuterol, LORazepam, sodium chloride flush   Vital Signs    Vitals:   11/06/17 0715 11/06/17 0730 11/06/17 0745 11/06/17 0800  BP: (!) 89/62 (!) 89/62 (!) 88/64 91/65  Pulse: 60 60 60 60  Resp: (!) 28 (!) 29 (!) 28 (!) 30  Temp:      TempSrc:      SpO2: 93% 93% 94% 95%  Weight:      Height:        Intake/Output Summary (Last 24 hours) at 11/06/2017 0814 Last data filed at 11/06/2017 0758 Gross per 24 hour  Intake 4482.38 ml  Output 1800 ml  Net  2682.38 ml   Filed Weights   11/04/17 0200 11/05/17 0500 11/06/17 0401  Weight: 200 lb 6.4 oz (90.9 kg) 203 lb 0.7 oz (92.1 kg) 207 lb 3.7 oz (94 kg)    Telemetry    AV paced - Personally Reviewed  ECG    n/a  Physical Exam   GEN: No acute distress.   Neck: No JVD Cardiac: RRR, no murmurs, rubs, or gallops.  Respiratory: coarse bilaterally GI: Soft, nontender, non-distended  MS: No edema; No deformity. Neuro:  Nonfocal  Psych: Normal affect   Labs    Chemistry Recent Labs  Lab 11/18/2017 1752 11/04/17 0433  11/04/17 2358 11/05/17 0458 11/06/17 0513  NA 135 135   < > 136 142 140  K 4.2 4.5   < > 3.5 3.4* 3.2*  CL 100* 102   < > 106 109 110  CO2 19* 15*   < > 19* 19* 18*  GLUCOSE 395* 453*   < > 168* 158* 174*  BUN 29* 27*   < > 44* 48* 51*  CREATININE 1.02* 0.95   < > 1.44* 1.48* 1.33*  CALCIUM 8.7* 8.2*   < > 8.5* 8.6* 8.7*  PROT 7.4 7.3  --   --   --   --   ALBUMIN 3.1* 3.0*  --   --   --   --  AST 31 42*  --   --   --   --   ALT 27 25  --   --   --   --   ALKPHOS 68 67  --   --   --   --   BILITOT 0.9 1.2  --   --   --   --   GFRNONAA >60 >60   < > 41* 40* 45*  GFRAA >60 >60   < > 48* 46* 53*  ANIONGAP 16* 18*   < > 11 14 12    < > = values in this interval not displayed.     Hematology Recent Labs  Lab 11/15/2017 1752 11/04/17 0433 11/06/17 0513  WBC 12.6* 11.4* 12.9*  RBC 5.18* 5.03 4.15  HGB 16.1* 15.6* 12.6  HCT 48.1* 47.8* 38.4  MCV 92.9 95.0 92.5  MCH 31.1 31.0 30.4  MCHC 33.5 32.6 32.8  RDW 13.9 14.2 14.7  PLT 123* 118* 156    Cardiac Enzymes Recent Labs  Lab 11/05/17 0458 11/05/17 0909 11/05/17 1439 11/05/17 2036  TROPONINI 1.26* 1.18* 1.30* 1.68*   No results for input(s): TROPIPOC in the last 168 hours.   BNP Recent Labs  Lab 11/10/2017 1753 11/04/17 1619  BNP 36.0 368.0*     DDimer No results for input(s): DDIMER in the last 168 hours.   Radiology    Portable Chest Xray  Result Date: 11/05/2017 CLINICAL DATA:   Respiratory failure.  COPD. EXAM: PORTABLE CHEST 1 VIEW COMPARISON:  Portable chest x-ray of November 12, 2017 FINDINGS: The patient is rotated on this study which limits comparison with the previous study. The lungs are well-expanded. The interstitial markings remain increased bilaterally. There are confluent infiltrates in the right perihilar and left perihilar regions which are stable. The cardiac silhouette is mildly enlarged. The pulmonary vascularity is not clearly engorged. The endotracheal tube tip lies approximately 5.1 cm above the carina. The esophagogastric tube tip in proximal port lie below the left hemidiaphragm. The ICD is grossly stable in appearance. IMPRESSION: Stable bilateral perihilar atelectasis or pneumonia. Mild pulmonary interstitial edema, stable. The support tubes are in reasonable position. Electronically Signed   By: David  [place]ique M.D.   On: 11/05/2017 07:49    Cardiac Studies    Patient Profile     [name] a 52 y.o.femalewith a hx of sinus node dysfunction with pacemaker, COPD, Crohns who is being seen today for the evaluation of SOB at the request of Dr Dyann Kief.    Assessment & Plan    1. Acute systolic heart failure - LVEF down from normal to 30% from just last month, primarily apical WMAs would suggest Takotsubo however this diagnosis requires the exclusion of CAD - suspect stress induced CM in setting of influenza and respiratory failure. ACS less likely. Once more stable I think she may require a cath to confirm the diagnosis.  - fairly mild troponin elevation given the degree of cardiac dysfunction, EKG remains stable without acute ischemic changes.  - trop has not yet peaked, will continue to follow.   - diuretics held yesterday due to AKI and low CVP 6-7 in ventilated patient. Mixed venous O2 sat 70% indicating appropriate cardiac output.  - soft bp's have limited medical therapy for CHF, suspected component of sepsis and vasiodilation. Bp's too  soft today to start meds.  - CVP is 8, Cr improving with holding diuretics. Would continue to hold today.  - repeat mixed venous today.   2.  Influenza - management per primary team.   3. Troponin elevation - fairly mild troponin elevation given the degree of cardiac dysfunction, EKG remains stable without acute ischemic changes.  - suspect related to demand ischemia and stress induced CM. However, would heparnize until trop peaks. - continue ASA/plavix (looks to have been on DAPT at home due to prior CVA)  - tropononin has not peaked, would continue heparin at this time.   4. AKI - improving with holding diuretics yesterday - relatively low CVP would suggest prerenal. FeUrea not back yet.   5. COPD exacerbation - per pulmonary   For questions or updates, please contact [place] Please consult [email or web address] for contact info under Cardiology/STEMI.      Merrily Pew, MD  11/06/2017, 8:14 AM

## 2017-11-06 NOTE — Progress Notes (Signed)
ANTICOAGULATION CONSULT NOTE   Pharmacy Consult for Heparin Indication: chest pain/ACS  Allergies  Allergen Reactions  . Flexeril [Cyclobenzaprine] Hives  . Amoxicillin Hives and Rash    Has patient had a PCN reaction causing immediate rash, facial/tongue/throat swelling, SOB or lightheadedness with hypotension: Yes Has patient had a PCN reaction causing severe rash involving mucus membranes or skin necrosis: Yes Has patient had a PCN reaction that required hospitalization No Has patient had a PCN reaction occurring within the last 10 years: Yes If all of the above answers are "NO", then may proceed with Cephalosporin use.    Patient Measurements: Height: 5\' 4"  (162.6 cm) Weight: 207 lb 3.7 oz (94 kg) IBW/kg (Calculated) : 54.7 HEPARIN DW (KG): 75.1  Vital Signs: Temp: 97.4 F (36.3 C) (02/14 0401) Temp Source: Axillary (02/14 0401) BP: 91/65 (02/14 0800) Pulse Rate: 60 (02/14 0800)  Labs: Recent Labs    11/02/2017 1752 11/04/17 0433  11/04/17 2358 11/05/17 0458 11/05/17 0909 11/05/17 1439 11/05/17 1458 11/05/17 2036 11/06/17 0513  HGB 16.1* 15.6*  --   --   --   --   --   --   --  12.6  HCT 48.1* 47.8*  --   --   --   --   --   --   --  38.4  PLT 123* 118*  --   --   --   --   --   --   --  156  HEPARINUNFRC  --   --   --   --   --   --   --  0.59  --  0.36  CREATININE 1.02* 0.95   < > 1.44* 1.48*  --   --   --   --  1.33*  TROPONINI <0.03  --    < >  --  1.26* 1.18* 1.30*  --  1.68*  --    < > = values in this interval not displayed.   Estimated Creatinine Clearance: 55.6 mL/min (A) (by C-G formula based on SCr of 1.33 mg/dL (H)).  Medical History: Past Medical History:  Diagnosis Date  . Anxiety   . Arthritis   . Cardiac pacemaker in situ 2009   DDD [employer] -- ALTRUNA 60  . Complication of anesthesia   . COPD with asthma (HCC)    GOLD 2-3 --  pulmologist (last visit 2011) Dr. Marchelle Gearing  . Crohn's disease (HCC)    Large intestine  . Depression  2016   PTSD  . Essential hypertension   . Family history of adverse reaction to anesthesia    father- stop breathing surgery   . Gastroparesis   . GERD (gastroesophageal reflux disease)   . History of hiatal hernia   . History of kidney stones   . History of stroke    Jun 2011 -- right hand weakness  . History of syncope   . Inappropriate sinus tachycardia    Sinus node modification 02-25-2003 by Dr. Lewayne Bunting  . LLQ abdominal tenderness 10/20/2015  . Neuromuscular disorder (HCC)    neuropathy   . OSA (obstructive sleep apnea)    Study done 2005 -- pt refused CPAP/previously was using nocturnal oxygen until one year ago pt states PCP is monitoring pt without  . Pelvic pain in female   . PONV (postoperative nausea and vomiting)   . Presence of permanent cardiac pacemaker   . PTSD (post-traumatic stress disorder)   . Sinus node dysfunction (HCC)  Symptomatic bradycardia  . Stroke [facility]) 2014   left leg weakness- uses leg brace   . Type 2 diabetes mellitus (HCC)   . Wears glasses    Medications:  Medications Prior to Admission  Medication Sig Dispense Refill Last Dose  . acetaminophen (TYLENOL) 500 MG tablet Take 500 mg by mouth every 6 (six) hours as needed for mild pain or moderate pain.   11/02/2017 at Unknown time  . aspirin EC 81 MG tablet Take 81 mg by mouth daily.   10/28/2017 at Unknown time  . carvedilol (COREG) 6.25 MG tablet Take 1 tablet (6.25 mg total) by mouth 2 (two) times daily. 180 tablet 3 10/30/2017 at Unknown time  . Cholecalciferol (VITAMIN D3) 5000 units CAPS Take 1 capsule (5,000 Units total) by mouth daily. 90 capsule 0 11/02/2017 at Unknown time  . clopidogrel (PLAVIX) 75 MG tablet Take 75 mg by mouth daily.   11/01/2017 at Unknown time  . diclofenac sodium (VOLTAREN) 1 % GEL Apply 4 g topically 4 (four) times daily. (Patient taking differently: Apply 2-4 g topically daily as needed (for leg pain). ) 200 g 11 Past Month at Unknown time  . dicyclomine  (BENTYL) 20 MG tablet Take 1 tablet (20 mg total) by mouth every 8 (eight) hours as needed for spasms. 90 tablet 3 11/19/2017 at Unknown time  . diphenoxylate-atropine (LOMOTIL) 2.5-0.025 MG tablet Take 2 tabs by mouth x 1, then 1 tab every 4 hrs PRN, max of 8 tabs in 24 hour time 240 tablet 1 Past Week at Unknown time  . DULoxetine (CYMBALTA) 30 MG capsule Take 90 mg daily (60 mg + 30 mg) (Patient taking differently: Take 30 mg by mouth daily. ) 30 capsule 0 11/10/2017 at Unknown time  . DULoxetine (CYMBALTA) 60 MG capsule Take 90 mg daily (60 mg + 30 mg) 30 capsule 0 11/02/2017 at Unknown time  . furosemide (LASIX) 40 MG tablet TAKE (1) TABLET BY MOUTH EACH MORNING. 90 tablet 3 11/10/2017 at Unknown time  . ibuprofen (ADVIL,MOTRIN) 600 MG tablet Take 600 mg by mouth 2 (two) times daily as needed for moderate pain.    10/30/2017 at Unknown time  . insulin aspart (NOVOLOG FLEXPEN) 100 UNIT/ML FlexPen Inject 15-21 Units into the skin 3 (three) times daily with meals. 5 pen 2 11/10/2017 at Unknown time  . INVOKANA 100 MG TABS tablet TAKE 1 TABLET BY MOUTH DAILY BEFORE BREAKFAST. 30 tablet 2 10/29/2017 at Unknown time  . LEVEMIR FLEXTOUCH 100 UNIT/ML Pen INJECT 80 UNITS UNDER THE SKIN EVERY DAY AT 10 PM 30 mL 2 11/02/2017 at Unknown time  . lisinopril (PRINIVIL,ZESTRIL) 5 MG tablet Take 5 mg by mouth daily.   11/05/2017 at Unknown time  . LORazepam (ATIVAN) 0.5 MG tablet Take 1 tablet (0.5 mg total) by mouth daily as needed for anxiety. 30 tablet 0 Past Week at Unknown time  . LYRICA 150 MG capsule take 150mg  by mouth three times daily  0 11/14/2017 at Unknown time  . meloxicam (MOBIC) 15 MG tablet Take 15 mg by mouth daily.   10/31/2017 at Unknown time  . metFORMIN (GLUCOPHAGE) 1000 MG tablet TAKE 1 TABLET(1000 MG) BY MOUTH TWICE DAILY WITH A MEAL 60 tablet 3 11/13/2017 at Unknown time  . nitroGLYCERIN (NITROSTAT) 0.4 MG SL tablet Place 1 tablet (0.4 mg total) under the tongue every 5 (five) minutes as needed for  chest pain. 30 tablet 0 unknown  . pantoprazole (PROTONIX) 40 MG tablet Take 1 tablet (40  mg total) by mouth 2 (two) times daily. 60 tablet 0 25-Nov-2017 at Unknown time  . potassium chloride (K-DUR) 10 MEQ tablet Take 1 tablet (10 mEq total) by mouth daily. 90 tablet 3 11/25/2017 at Unknown time  . pramipexole (MIRAPEX) 0.25 MG tablet Take 0.25 mg by mouth 2 (two) times daily.    2017/11/25 at Unknown time  . simvastatin (ZOCOR) 10 MG tablet TAKE (1) TABLET BY MOUTH DAILY. (Patient taking differently: TAKE (2) TABLET BY MOUTH DAILY.) 90 tablet 2 11/25/2017 at Unknown time  . tapentadol (NUCYNTA ER) 100 MG 12 hr tablet Take 100 mg by mouth every 12 (twelve) hours.   11/02/2017 at Unknown time  . tiZANidine (ZANAFLEX) 4 MG capsule Take 4 mg by mouth 3 (three) times daily.   11-25-17 at Unknown time  . albuterol (PROVENTIL) (2.5 MG/3ML) 0.083% nebulizer solution Take 3 mLs (2.5 mg total) by nebulization every 6 (six) hours as needed for wheezing or shortness of breath. 75 mL 12 unknown  . colestipol (COLESTID) 1 g tablet Take 1 tablet (1 g total) by mouth 2 (two) times daily. (Patient not taking: Reported on November 25, 2017) 90 tablet 3 unknown  . inFLIXimab 500 mg in sodium chloride 0.9 % 200 mL Inject 500 mg into the vein every 8 (eight) weeks. 1 Dose 6 Taking  . OXYGEN Inhale 3 L/day into the lungs at bedtime.   Taking   Assessment: Asked by cardiology to initiate Heparin for ACS.  Heparin level is therapeutic .   Goal of Therapy:  Heparin level 0.3-0.7 units/ml Monitor platelets by anticoagulation protocol: Yes   Plan:   Continue Heparin infusion at 1050 units/hr  Heparin daily  CBC daily while on Heparin   Elder Cyphers, BS Loura Back, BCPS Clinical Pharmacist Pager [phone] 11/06/2017,8:19 AM

## 2017-11-06 NOTE — Progress Notes (Signed)
Subjective: She remains intubated and on mechanical ventilation.  She looks substantially more comfortable than previously and we have been able to reduce her FiO2 down to 50% now.  Objective: Vital signs in last 24 hours: Temp:  [97.4 F (36.3 C)-97.9 F (36.6 C)] 97.4 F (36.3 C) (02/14 0401) Pulse Rate:  [59-84] 65 (02/14 0600) Resp:  [23-37] 30 (02/14 0600) BP: (71-113)/(55-85) 88/59 (02/14 0600) SpO2:  [92 %-100 %] 93 % (02/14 0600) FiO2 (%):  [50 %-60 %] 50 % (02/14 0357) Weight:  [94 kg (207 lb 3.7 oz)] 94 kg (207 lb 3.7 oz) (02/14 0401) Weight change: 1.9 kg (4 lb 3 oz) Last BM Date: 11/04/17  Intake/Output from previous day: 02/13 0701 - 02/14 0700 In: 4457.6 [I.V.:3357.6; IV Piggyback:1100] Out: 1800 [Urine:1800]  PHYSICAL EXAM General appearance: Intubated sedated on mechanical ventilation looks more comfortable than previously Resp: Her lungs are much clearer than previously Cardio: regular rate and rhythm, S1, S2 normal, no murmur, click, rub or gallop GI: soft, non-tender; bowel sounds normal; no masses,  no organomegaly Extremities: extremities normal, atraumatic, no cyanosis or edema Throat is clear with the exception of tubing  Lab Results:  Results for orders placed or performed during the hospital encounter of 11/08/2017 (from the past 48 hour(s))  Glucose, capillary     Status: Abnormal   Collection Time: 11/04/17  7:41 AM  Result Value Ref Range   Glucose-Capillary 466 (H) 65 - 99 mg/dL  Glucose, capillary     Status: Abnormal   Collection Time: 11/04/17  8:46 AM  Result Value Ref Range   Glucose-Capillary 483 (H) 65 - 99 mg/dL  Basic metabolic panel     Status: Abnormal   Collection Time: 11/04/17  8:51 AM  Result Value Ref Range   Sodium 135 135 - 145 mmol/L   Potassium 4.4 3.5 - 5.1 mmol/L   Chloride 102 101 - 111 mmol/L   CO2 15 (L) 22 - 32 mmol/L   Glucose, Bld 482 (H) 65 - 99 mg/dL   BUN 33 (H) 6 - 20 mg/dL   Creatinine, Ser 1.23 (H) 0.44 -  1.00 mg/dL   Calcium 8.4 (L) 8.9 - 10.3 mg/dL   GFR calc non Af Amer 50 (L) >60 mL/min   GFR calc Af Amer 58 (L) >60 mL/min    Comment: (NOTE) The eGFR has been calculated using the CKD EPI equation. This calculation has not been validated in all clinical situations. eGFR's persistently <60 mL/min signify possible Chronic Kidney Disease.    Anion gap 18 (H) 5 - 15    Comment: Performed at [facility], [address], [place], [place] [address]  Hemoglobin A1c     Status: Abnormal   Collection Time: 11/04/17  8:51 AM  Result Value Ref Range   Hgb A1c MFr Bld 8.8 (H) 4.8 - 5.6 %    Comment: (NOTE) Pre diabetes:          5.7%-6.4% Diabetes:              >6.4% Glycemic control for   <7.0% adults with diabetes    Mean Plasma Glucose 205.86 mg/dL    Comment: Performed at [facility] [address] [place], [place] [address]  Lactic acid, plasma     Status: Abnormal   Collection Time: 11/04/17  8:51 AM  Result Value Ref Range   Lactic Acid, Venous 2.3 (HH) 0.5 - 1.9 mmol/L    Comment: CRITICAL RESULT CALLED TO, READ  BACK BY AND VERIFIED WITH: MOTLEY,J AT 9:50AM ON 11/04/17 BY [facility] Performed at [facility], [address], [place], [place] [address]   Glucose, capillary     Status: Abnormal   Collection Time: 11/04/17  9:53 AM  Result Value Ref Range   Glucose-Capillary 436 (H) 65 - 99 mg/dL  Draw ABG 1 hour after initiation of ventilator     Status: Abnormal   Collection Time: 11/04/17 10:15 AM  Result Value Ref Range   FIO2 70.00    Delivery systems VENTILATOR    Mode PRESSURE REGULATED VOLUME CONTROL    VT 500 mL   LHR 22 resp/min   Peep/cpap 5.0 cm H20   pH, Arterial 7.368 7.350 - 7.450   pCO2 arterial 26.8 (L) 32.0 - 48.0 mmHg   pO2, Arterial 99.1 83.0 - 108.0 mmHg   Bicarbonate 17.8 (L) 20.0 - 28.0 mmol/L   Acid-base deficit 9.2 (H) 0.0 - 2.0 mmol/L   O2 Saturation 96.6 %   Patient temperature 37.4    Collection site RIGHT RADIAL    Drawn by [id number]     Sample type ARTERIAL DRAW    Allens test (pass/fail) PASS PASS    Comment: Performed at [facility], [address] [place], [place] [address]  Glucose, capillary     Status: Abnormal   Collection Time: 11/04/17 10:47 AM  Result Value Ref Range   Glucose-Capillary 432 (H) 65 - 99 mg/dL  Glucose, capillary     Status: Abnormal   Collection Time: 11/04/17 11:53 AM  Result Value Ref Range   Glucose-Capillary 394 (H) 65 - 99 mg/dL  Basic metabolic panel     Status: Abnormal   Collection Time: 11/04/17 12:19 PM  Result Value Ref Range   Sodium 135 135 - 145 mmol/L   Potassium 3.8 3.5 - 5.1 mmol/L   Chloride 104 101 - 111 mmol/L   CO2 16 (L) 22 - 32 mmol/L   Glucose, Bld 403 (H) 65 - 99 mg/dL   BUN 38 (H) 6 - 20 mg/dL   Creatinine, Ser 1.23 (H) 0.44 - 1.00 mg/dL   Calcium 8.4 (L) 8.9 - 10.3 mg/dL   GFR calc non Af Amer 50 (L) >60 mL/min   GFR calc Af Amer 58 (L) >60 mL/min    Comment: (NOTE) The eGFR has been calculated using the CKD EPI equation. This calculation has not been validated in all clinical situations. eGFR's persistently <60 mL/min signify possible Chronic Kidney Disease.    Anion gap 15 5 - 15    Comment: Performed at [facility], [address], , [place] [address]  Lactic acid, plasma     Status: Abnormal   Collection Time: 11/04/17 12:19 PM  Result Value Ref Range   Lactic Acid, Venous 2.2 (HH) 0.5 - 1.9 mmol/L    Comment: CRITICAL RESULT CALLED TO, READ BACK BY AND VERIFIED WITH: MOTLEY,J AT 1330 ON 2.12.2019 BY ISLEY,B Performed at [facility], [address] [place], [place] [address]   Glucose, capillary     Status: Abnormal   Collection Time: 11/04/17  1:05 PM  Result Value Ref Range   Glucose-Capillary 385 (H) 65 - 99 mg/dL  Glucose, capillary     Status: Abnormal   Collection Time: 11/04/17  2:15 PM  Result Value Ref Range   Glucose-Capillary 313 (H) 65 - 99 mg/dL  Glucose, capillary     Status: Abnormal   Collection Time: 11/04/17  3:05  PM  Result Value Ref  Range   Glucose-Capillary 316 (H) 65 - 99 mg/dL  Glucose, capillary     Status: Abnormal   Collection Time: 11/04/17  4:15 PM  Result Value Ref Range   Glucose-Capillary 271 (H) 65 - 99 mg/dL  Basic metabolic panel     Status: Abnormal   Collection Time: 11/04/17  4:19 PM  Result Value Ref Range   Sodium 138 135 - 145 mmol/L   Potassium 3.5 3.5 - 5.1 mmol/L   Chloride 108 101 - 111 mmol/L   CO2 17 (L) 22 - 32 mmol/L   Glucose, Bld 265 (H) 65 - 99 mg/dL   BUN 40 (H) 6 - 20 mg/dL   Creatinine, Ser 1.28 (H) 0.44 - 1.00 mg/dL   Calcium 8.7 (L) 8.9 - 10.3 mg/dL   GFR calc non Af Amer 48 (L) >60 mL/min   GFR calc Af Amer 55 (L) >60 mL/min    Comment: (NOTE) The eGFR has been calculated using the CKD EPI equation. This calculation has not been validated in all clinical situations. eGFR's persistently <60 mL/min signify possible Chronic Kidney Disease.    Anion gap 13 5 - 15    Comment: Performed at [facility], [address] [place], [place] [address]  Troponin I (q 6hr x 3)     Status: Abnormal   Collection Time: 11/04/17  4:19 PM  Result Value Ref Range   Troponin I 0.90 (HH) <0.03 ng/mL    Comment: CRITICAL RESULT CALLED TO, READ BACK BY AND VERIFIED WITH: DAVIS,C ON 11/04/17 AT 1720 BY LOY,C Performed at [facility], [address] [place], [place] [address]   Brain natriuretic peptide     Status: Abnormal   Collection Time: 11/04/17  4:19 PM  Result Value Ref Range   B Natriuretic Peptide 368.0 (H) 0.0 - 100.0 pg/mL    Comment: Performed at [facility], [address] [place], [place] [address]  Glucose, capillary     Status: Abnormal   Collection Time: 11/04/17  5:26 PM  Result Value Ref Range   Glucose-Capillary 211 (H) 65 - 99 mg/dL  Glucose, capillary     Status: Abnormal   Collection Time: 11/04/17  6:21 PM  Result Value Ref Range   Glucose-Capillary 228 (H) 65 - 99 mg/dL  Glucose, capillary     Status: Abnormal   Collection Time: 11/04/17   7:25 PM  Result Value Ref Range   Glucose-Capillary 189 (H) 65 - 99 mg/dL   Comment 1 Notify RN   Glucose, capillary     Status: Abnormal   Collection Time: 11/04/17  8:37 PM  Result Value Ref Range   Glucose-Capillary 152 (H) 65 - 99 mg/dL   Comment 1 Notify RN   Basic metabolic panel     Status: Abnormal   Collection Time: 11/04/17  9:08 PM  Result Value Ref Range   Sodium 137 135 - 145 mmol/L   Potassium 3.6 3.5 - 5.1 mmol/L   Chloride 106 101 - 111 mmol/L   CO2 18 (L) 22 - 32 mmol/L   Glucose, Bld 134 (H) 65 - 99 mg/dL   BUN 41 (H) 6 - 20 mg/dL   Creatinine, Ser 1.31 (H) 0.44 - 1.00 mg/dL   Calcium 8.5 (L) 8.9 - 10.3 mg/dL   GFR calc non Af Amer 46 (L) >60 mL/min   GFR calc Af Amer 54 (L) >60 mL/min    Comment: (NOTE) The eGFR has been calculated using the CKD EPI equation. This  calculation has not been validated in all clinical situations. eGFR's persistently <60 mL/min signify possible Chronic Kidney Disease.    Anion gap 13 5 - 15    Comment: Performed at [facility], [address] [place], [place] [address]  Troponin I (q 6hr x 3)     Status: Abnormal   Collection Time: 11/04/17  9:08 PM  Result Value Ref Range   Troponin I 1.04 (HH) <0.03 ng/mL    Comment: CRITICAL VALUE NOTED.  VALUE IS CONSISTENT WITH PREVIOUSLY REPORTED AND CALLED VALUE. Performed at [facility], [address] [place], [place] [address]   Glucose, capillary     Status: Abnormal   Collection Time: 11/04/17  9:33 PM  Result Value Ref Range   Glucose-Capillary 154 (H) 65 - 99 mg/dL   Comment 1 Notify RN   Glucose, capillary     Status: Abnormal   Collection Time: 11/04/17 10:30 PM  Result Value Ref Range   Glucose-Capillary 174 (H) 65 - 99 mg/dL   Comment 1 Notify RN   Glucose, capillary     Status: Abnormal   Collection Time: 11/04/17 11:41 PM  Result Value Ref Range   Glucose-Capillary 181 (H) 65 - 99 mg/dL   Comment 1 Notify RN   Basic metabolic panel     Status: Abnormal    Collection Time: 11/04/17 11:58 PM  Result Value Ref Range   Sodium 136 135 - 145 mmol/L   Potassium 3.5 3.5 - 5.1 mmol/L   Chloride 106 101 - 111 mmol/L   CO2 19 (L) 22 - 32 mmol/L   Glucose, Bld 168 (H) 65 - 99 mg/dL   BUN 44 (H) 6 - 20 mg/dL   Creatinine, Ser 1.44 (H) 0.44 - 1.00 mg/dL   Calcium 8.5 (L) 8.9 - 10.3 mg/dL   GFR calc non Af Amer 41 (L) >60 mL/min   GFR calc Af Amer 48 (L) >60 mL/min    Comment: (NOTE) The eGFR has been calculated using the CKD EPI equation. This calculation has not been validated in all clinical situations. eGFR's persistently <60 mL/min signify possible Chronic Kidney Disease.    Anion gap 11 5 - 15    Comment: Performed at [facility], [address], [place], [place] [address]  Glucose, capillary     Status: Abnormal   Collection Time: 11/05/17 12:35 AM  Result Value Ref Range   Glucose-Capillary 150 (H) 65 - 99 mg/dL   Comment 1 Notify RN   Glucose, capillary     Status: Abnormal   Collection Time: 11/05/17  1:33 AM  Result Value Ref Range   Glucose-Capillary 128 (H) 65 - 99 mg/dL   Comment 1 Notify RN   Glucose, capillary     Status: Abnormal   Collection Time: 11/05/17  2:32 AM  Result Value Ref Range   Glucose-Capillary 148 (H) 65 - 99 mg/dL  Glucose, capillary     Status: Abnormal   Collection Time: 11/05/17  3:32 AM  Result Value Ref Range   Glucose-Capillary 135 (H) 65 - 99 mg/dL   Comment 1 Notify RN   Glucose, capillary     Status: Abnormal   Collection Time: 11/05/17  4:34 AM  Result Value Ref Range   Glucose-Capillary 149 (H) 65 - 99 mg/dL   Comment 1 Notify RN   Draw ABG 1 hour after initiation of ventilator     Status: Abnormal   Collection Time: 11/05/17  4:45 AM  Result Value Ref Range  FIO2 60.00    Delivery systems VENTILATOR    Mode PRESSURE REGULATED VOLUME CONTROL    VT 500 mL   LHR 22.0 resp/min   Peep/cpap 5.0 cm H20   pH, Arterial 7.436 7.350 - 7.450   pCO2 arterial 29.1 (L) 32.0 - 48.0 mmHg   pO2,  Arterial 92.6 83.0 - 108.0 mmHg   Bicarbonate 22.1 20.0 - 28.0 mmol/L   Acid-Base Excess 22.1 (H) 0.0 - 2.0 mmol/L   O2 Saturation 96.1 %   Collection site LEFT RADIAL    Drawn by [id number]    Sample type ARTERIAL DRAW    Allens test (pass/fail) PASS PASS    Comment: Performed at [facility], [address] [place], [place] [address]  Basic metabolic panel     Status: Abnormal   Collection Time: 11/05/17  4:58 AM  Result Value Ref Range   Sodium 142 135 - 145 mmol/L   Potassium 3.4 (L) 3.5 - 5.1 mmol/L   Chloride 109 101 - 111 mmol/L   CO2 19 (L) 22 - 32 mmol/L   Glucose, Bld 158 (H) 65 - 99 mg/dL   BUN 48 (H) 6 - 20 mg/dL   Creatinine, Ser 1.48 (H) 0.44 - 1.00 mg/dL   Calcium 8.6 (L) 8.9 - 10.3 mg/dL   GFR calc non Af Amer 40 (L) >60 mL/min   GFR calc Af Amer 46 (L) >60 mL/min    Comment: (NOTE) The eGFR has been calculated using the CKD EPI equation. This calculation has not been validated in all clinical situations. eGFR's persistently <60 mL/min signify possible Chronic Kidney Disease.    Anion gap 14 5 - 15    Comment: Performed at [facility], [address] [place], [place] [address]  Troponin I     Status: Abnormal   Collection Time: 11/05/17  4:58 AM  Result Value Ref Range   Troponin I 1.26 (HH) <0.03 ng/mL    Comment: CRITICAL RESULT CALLED TO, READ BACK BY AND VERIFIED WITH: WAGONER,R AT 6:00AM ON 11/05/17 BY Derrill Memo Performed at [facility], [address], [place], [place] [address]   Glucose, capillary     Status: Abnormal   Collection Time: 11/05/17  6:22 AM  Result Value Ref Range   Glucose-Capillary 143 (H) 65 - 99 mg/dL   Comment 1 Notify RN   Glucose, capillary     Status: Abnormal   Collection Time: 11/05/17  7:30 AM  Result Value Ref Range   Glucose-Capillary 158 (H) 65 - 99 mg/dL  Creatinine, urine, random     Status: None   Collection Time: 11/05/17  8:28 AM  Result Value Ref Range   Creatinine, Urine 67.30 mg/dL    Comment: Performed at  [facility], [address], [place], [place] [address]  Glucose, capillary     Status: Abnormal   Collection Time: 11/05/17  8:35 AM  Result Value Ref Range   Glucose-Capillary 156 (H) 65 - 99 mg/dL  .Cooxemetry Panel (carboxy, met, total hgb, O2 sat)     Status: Abnormal   Collection Time: 11/05/17  9:00 AM  Result Value Ref Range   Total hemoglobin 12.0 12.0 - 16.0 g/dL   O2 Saturation 72.9 %   Carboxyhemoglobin 0.6 0.5 - 1.5 %   Methemoglobin 1.2 0.0 - 1.5 %   Total oxygen content 12.1 (L) 15.0 - 23.0 mL/dL    Comment: Performed at [facility], [address], [place], [place] [address]  Troponin I (q 6hr x  3)     Status: Abnormal   Collection Time: 11/05/17  9:09 AM  Result Value Ref Range   Troponin I 1.18 (HH) <0.03 ng/mL    Comment: CRITICAL VALUE NOTED.  VALUE IS CONSISTENT WITH PREVIOUSLY REPORTED AND CALLED VALUE. Performed at [facility], [address], [place], [place] [address]   Glucose, capillary     Status: Abnormal   Collection Time: 11/05/17 10:41 AM  Result Value Ref Range   Glucose-Capillary 162 (H) 65 - 99 mg/dL  Glucose, capillary     Status: Abnormal   Collection Time: 11/05/17 11:41 AM  Result Value Ref Range   Glucose-Capillary 174 (H) 65 - 99 mg/dL  Glucose, capillary     Status: Abnormal   Collection Time: 11/05/17 12:50 PM  Result Value Ref Range   Glucose-Capillary 162 (H) 65 - 99 mg/dL  Glucose, capillary     Status: Abnormal   Collection Time: 11/05/17  1:50 PM  Result Value Ref Range   Glucose-Capillary 157 (H) 65 - 99 mg/dL  Troponin I (q 6hr x 3)     Status: Abnormal   Collection Time: 11/05/17  2:39 PM  Result Value Ref Range   Troponin I 1.30 (HH) <0.03 ng/mL    Comment: CRITICAL RESULT CALLED TO, READ BACK BY AND VERIFIED WITH: ROBERTS, T AT 1530 ON 2.13.2019 BY ISLEY,B Performed at [facility], [address], [place], [place] [address]   Heparin level (unfractionated)     Status: None   Collection Time: 11/05/17  2:58 PM  Result  Value Ref Range   Heparin Unfractionated 0.59 0.30 - 0.70 IU/mL    Comment:        IF HEPARIN RESULTS ARE BELOW EXPECTED VALUES, AND PATIENT DOSAGE HAS BEEN CONFIRMED, SUGGEST FOLLOW UP TESTING OF ANTITHROMBIN III LEVELS. Performed at [facility], [address] [place], [place] [address]   Glucose, capillary     Status: Abnormal   Collection Time: 11/05/17  3:04 PM  Result Value Ref Range   Glucose-Capillary 176 (H) 65 - 99 mg/dL  Glucose, capillary     Status: Abnormal   Collection Time: 11/05/17  4:10 PM  Result Value Ref Range   Glucose-Capillary 177 (H) 65 - 99 mg/dL  Glucose, capillary     Status: Abnormal   Collection Time: 11/05/17  5:18 PM  Result Value Ref Range   Glucose-Capillary 163 (H) 65 - 99 mg/dL  Glucose, capillary     Status: Abnormal   Collection Time: 11/05/17  6:17 PM  Result Value Ref Range   Glucose-Capillary 154 (H) 65 - 99 mg/dL  Glucose, capillary     Status: Abnormal   Collection Time: 11/05/17  7:26 PM  Result Value Ref Range   Glucose-Capillary 189 (H) 65 - 99 mg/dL   Comment 1 Notify RN    Comment 2 Document in Chart   Glucose, capillary     Status: Abnormal   Collection Time: 11/05/17  8:23 PM  Result Value Ref Range   Glucose-Capillary 196 (H) 65 - 99 mg/dL   Comment 1 Notify RN    Comment 2 Document in Chart   Troponin I (q 6hr x 3)     Status: Abnormal   Collection Time: 11/05/17  8:36 PM  Result Value Ref Range   Troponin I 1.68 (HH) <0.03 ng/mL    Comment: CRITICAL VALUE NOTED.  VALUE IS CONSISTENT WITH PREVIOUSLY REPORTED AND CALLED VALUE. Performed at [facility], [address] [place], [place]  [address]   Glucose, capillary     Status: Abnormal   Collection Time: 11/05/17  9:23 PM  Result Value Ref Range   Glucose-Capillary 190 (H) 65 - 99 mg/dL   Comment 1 Notify RN    Comment 2 Document in Chart   Glucose, capillary     Status: Abnormal   Collection Time: 11/05/17 10:24 PM  Result Value Ref Range   Glucose-Capillary  160 (H) 65 - 99 mg/dL   Comment 1 Notify RN    Comment 2 Document in Chart   Glucose, capillary     Status: Abnormal   Collection Time: 11/05/17 11:25 PM  Result Value Ref Range   Glucose-Capillary 164 (H) 65 - 99 mg/dL   Comment 1 Notify RN    Comment 2 Document in Chart   Glucose, capillary     Status: Abnormal   Collection Time: 11/06/17 12:46 AM  Result Value Ref Range   Glucose-Capillary 153 (H) 65 - 99 mg/dL  Glucose, capillary     Status: Abnormal   Collection Time: 11/06/17  1:27 AM  Result Value Ref Range   Glucose-Capillary 131 (H) 65 - 99 mg/dL   Comment 1 Notify RN    Comment 2 Document in Chart   Glucose, capillary     Status: Abnormal   Collection Time: 11/06/17  2:43 AM  Result Value Ref Range   Glucose-Capillary 154 (H) 65 - 99 mg/dL   Comment 1 Notify RN    Comment 2 Document in Chart   Glucose, capillary     Status: Abnormal   Collection Time: 11/06/17  3:55 AM  Result Value Ref Range   Glucose-Capillary 168 (H) 65 - 99 mg/dL   Comment 1 Notify RN    Comment 2 Document in Chart   Draw ABG 1 hour after initiation of ventilator     Status: Abnormal   Collection Time: 11/06/17  4:35 AM  Result Value Ref Range   FIO2 55.00    Delivery systems VENTILATOR    Mode PRESSURE REGULATED VOLUME CONTROL    VT 500 mL   LHR 22.0 resp/min   Peep/cpap 5.0 cm H20   pH, Arterial 7.378 7.350 - 7.450   pCO2 arterial 29.4 (L) 32.0 - 48.0 mmHg   pO2, Arterial 78.5 (L) 83.0 - 108.0 mmHg   Bicarbonate 19.3 (L) 20.0 - 28.0 mmol/L   O2 Saturation 94.0 %   Collection site LEFT RADIAL    Drawn by [id number]    Sample type ARTERIAL DRAW    Allens test (pass/fail) PASS PASS    Comment: Performed at [facility], [address], [place], [place] [address]  Glucose, capillary     Status: Abnormal   Collection Time: 11/06/17  4:45 AM  Result Value Ref Range   Glucose-Capillary 166 (H) 65 - 99 mg/dL   Comment 1 Notify RN    Comment 2 Document in Chart   Basic metabolic panel      Status: Abnormal   Collection Time: 11/06/17  5:13 AM  Result Value Ref Range   Sodium 140 135 - 145 mmol/L   Potassium 3.2 (L) 3.5 - 5.1 mmol/L   Chloride 110 101 - 111 mmol/L   CO2 18 (L) 22 - 32 mmol/L   Glucose, Bld 174 (H) 65 - 99 mg/dL   BUN 51 (H) 6 - 20 mg/dL   Creatinine, Ser 1.33 (H) 0.44 - 1.00 mg/dL   Calcium 8.7 (L) 8.9 - 10.3 mg/dL   GFR  calc non Af Amer 45 (L) >60 mL/min   GFR calc Af Amer 53 (L) >60 mL/min    Comment: (NOTE) The eGFR has been calculated using the CKD EPI equation. This calculation has not been validated in all clinical situations. eGFR's persistently <60 mL/min signify possible Chronic Kidney Disease.    Anion gap 12 5 - 15    Comment: Performed at [facility], [address] [place], [place] [address]  CBC     Status: Abnormal   Collection Time: 11/06/17  5:13 AM  Result Value Ref Range   WBC 12.9 (H) 4.0 - 10.5 K/uL   RBC 4.15 3.87 - 5.11 MIL/uL   Hemoglobin 12.6 12.0 - 15.0 g/dL   HCT 38.4 36.0 - 46.0 %   MCV 92.5 78.0 - 100.0 fL   MCH 30.4 26.0 - 34.0 pg   MCHC 32.8 30.0 - 36.0 g/dL   RDW 14.7 11.5 - 15.5 %   Platelets 156 150 - 400 K/uL    Comment: Performed at [facility], [address], [place], [place] [address]  Vancomycin, trough     Status: Abnormal   Collection Time: 11/06/17  5:13 AM  Result Value Ref Range   Vancomycin Tr 38 (HH) 15 - 20 ug/mL    Comment: CRITICAL RESULT CALLED TO, READ BACK BY AND VERIFIED WITH: SMITH,J AT 6:25AM ON 11/06/17 BY [facility] Performed at [facility], [address], [place], [place] [address]   Glucose, capillary     Status: Abnormal   Collection Time: 11/06/17  5:57 AM  Result Value Ref Range   Glucose-Capillary 160 (H) 65 - 99 mg/dL   Comment 1 Notify RN    Comment 2 Document in Chart   Magnesium     Status: None   Collection Time: 11/06/17  6:10 AM  Result Value Ref Range   Magnesium 1.9 1.7 - 2.4 mg/dL    Comment: Performed at [facility], [address], [place], [place]  [address]  Glucose, capillary     Status: Abnormal   Collection Time: 11/06/17  6:42 AM  Result Value Ref Range   Glucose-Capillary 156 (H) 65 - 99 mg/dL   Comment 1 Notify RN    Comment 2 Document in Chart     ABGS Recent Labs    11/06/17 0435  PHART 7.378  PO2ART 78.5*  HCO3 19.3*   CULTURES Recent Results (from the past 240 hour(s))  Blood Culture (routine x 2)     Status: None (Preliminary result)   Collection Time: 11/02/2017  6:27 PM  Result Value Ref Range Status   Specimen Description RIGHT ANTECUBITAL  Final   Special Requests   Final    BOTTLES DRAWN AEROBIC AND ANAEROBIC Blood Culture adequate volume   Culture   Final    NO GROWTH 2 DAYS Performed at [facility], [address] [place], [place] [address]    Report Status PENDING  Incomplete  Blood Culture (routine x 2)     Status: None (Preliminary result)   Collection Time: 11/14/2017  6:27 PM  Result Value Ref Range Status   Specimen Description RIGHT ANTECUBITAL  Final   Special Requests   Final    BOTTLES DRAWN AEROBIC AND ANAEROBIC Blood Culture adequate volume   Culture   Final    NO GROWTH 2 DAYS Performed at [facility], [address] [place], [place] [address]    Report Status PENDING  Incomplete  MRSA PCR Screening     Status:  None   Collection Time: 11/04/17  1:26 AM  Result Value Ref Range Status   MRSA by PCR NEGATIVE NEGATIVE Final    Comment:        The GeneXpert MRSA Assay (FDA approved for NASAL specimens only), is one component of a comprehensive MRSA colonization surveillance program. It is not intended to diagnose MRSA infection nor to guide or monitor treatment for MRSA infections. Performed at [facility], [address], [place], [place] [address]    Studies/Results: Portable Chest Xray  Result Date: 11/05/2017 CLINICAL DATA:  Respiratory failure.  COPD. EXAM: PORTABLE CHEST 1 VIEW COMPARISON:  Portable chest x-ray of November 12, 2017 FINDINGS: The patient is rotated on this  study which limits comparison with the previous study. The lungs are well-expanded. The interstitial markings remain increased bilaterally. There are confluent infiltrates in the right perihilar and left perihilar regions which are stable. The cardiac silhouette is mildly enlarged. The pulmonary vascularity is not clearly engorged. The endotracheal tube tip lies approximately 5.1 cm above the carina. The esophagogastric tube tip in proximal port lie below the left hemidiaphragm. The ICD is grossly stable in appearance. IMPRESSION: Stable bilateral perihilar atelectasis or pneumonia. Mild pulmonary interstitial edema, stable. The support tubes are in reasonable position. Electronically Signed   By: David  Martinique M.D.   On: 11/05/2017 07:49    Medications:  Prior to Admission:  Medications Prior to Admission  Medication Sig Dispense Refill Last Dose  . acetaminophen (TYLENOL) 500 MG tablet Take 500 mg by mouth every 6 (six) hours as needed for mild pain or moderate pain.   11/02/2017 at Unknown time  . aspirin EC 81 MG tablet Take 81 mg by mouth daily.   10/27/2017 at Unknown time  . carvedilol (COREG) 6.25 MG tablet Take 1 tablet (6.25 mg total) by mouth 2 (two) times daily. 180 tablet 3 11/12/2017 at Unknown time  . Cholecalciferol (VITAMIN D3) 5000 units CAPS Take 1 capsule (5,000 Units total) by mouth daily. 90 capsule 0 11/20/2017 at Unknown time  . clopidogrel (PLAVIX) 75 MG tablet Take 75 mg by mouth daily.   11/13/2017 at Unknown time  . diclofenac sodium (VOLTAREN) 1 % GEL Apply 4 g topically 4 (four) times daily. (Patient taking differently: Apply 2-4 g topically daily as needed (for leg pain). ) 200 g 11 Past Month at Unknown time  . dicyclomine (BENTYL) 20 MG tablet Take 1 tablet (20 mg total) by mouth every 8 (eight) hours as needed for spasms. 90 tablet 3 10/31/2017 at Unknown time  . diphenoxylate-atropine (LOMOTIL) 2.5-0.025 MG tablet Take 2 tabs by mouth x 1, then 1 tab every 4 hrs PRN, max of  8 tabs in 24 hour time 240 tablet 1 Past Week at Unknown time  . DULoxetine (CYMBALTA) 30 MG capsule Take 90 mg daily (60 mg + 30 mg) (Patient taking differently: Take 30 mg by mouth daily. ) 30 capsule 0 10/31/2017 at Unknown time  . DULoxetine (CYMBALTA) 60 MG capsule Take 90 mg daily (60 mg + 30 mg) 30 capsule 0 10/27/2017 at Unknown time  . furosemide (LASIX) 40 MG tablet TAKE (1) TABLET BY MOUTH EACH MORNING. 90 tablet 3 10/26/2017 at Unknown time  . ibuprofen (ADVIL,MOTRIN) 600 MG tablet Take 600 mg by mouth 2 (two) times daily as needed for moderate pain.    11/15/2017 at Unknown time  . insulin aspart (NOVOLOG FLEXPEN) 100 UNIT/ML FlexPen Inject 15-21 Units into the skin 3 (three) times daily with  meals. 5 pen 2 11/02/2017 at Unknown time  . INVOKANA 100 MG TABS tablet TAKE 1 TABLET BY MOUTH DAILY BEFORE BREAKFAST. 30 tablet 2 10/31/2017 at Unknown time  . LEVEMIR FLEXTOUCH 100 UNIT/ML Pen INJECT 80 UNITS UNDER THE SKIN EVERY DAY AT 10 PM 30 mL 2 11/02/2017 at Unknown time  . lisinopril (PRINIVIL,ZESTRIL) 5 MG tablet Take 5 mg by mouth daily.   10/27/2017 at Unknown time  . LORazepam (ATIVAN) 0.5 MG tablet Take 1 tablet (0.5 mg total) by mouth daily as needed for anxiety. 30 tablet 0 Past Week at Unknown time  . LYRICA 150 MG capsule take 123m by mouth three times daily  0 11/10/2017 at Unknown time  . meloxicam (MOBIC) 15 MG tablet Take 15 mg by mouth daily.   11/06/2017 at Unknown time  . metFORMIN (GLUCOPHAGE) 1000 MG tablet TAKE 1 TABLET(1000 MG) BY MOUTH TWICE DAILY WITH A MEAL 60 tablet 3 10/29/2017 at Unknown time  . nitroGLYCERIN (NITROSTAT) 0.4 MG SL tablet Place 1 tablet (0.4 mg total) under the tongue every 5 (five) minutes as needed for chest pain. 30 tablet 0 unknown  . pantoprazole (PROTONIX) 40 MG tablet Take 1 tablet (40 mg total) by mouth 2 (two) times daily. 60 tablet 0 10/27/2017 at Unknown time  . potassium chloride (K-DUR) 10 MEQ tablet Take 1 tablet (10 mEq total) by mouth daily. 90  tablet 3 11/17/2017 at Unknown time  . pramipexole (MIRAPEX) 0.25 MG tablet Take 0.25 mg by mouth 2 (two) times daily.    11/07/2017 at Unknown time  . simvastatin (ZOCOR) 10 MG tablet TAKE (1) TABLET BY MOUTH DAILY. (Patient taking differently: TAKE (2) TABLET BY MOUTH DAILY.) 90 tablet 2 11/08/2017 at Unknown time  . tapentadol (NUCYNTA ER) 100 MG 12 hr tablet Take 100 mg by mouth every 12 (twelve) hours.   11/02/2017 at Unknown time  . tiZANidine (ZANAFLEX) 4 MG capsule Take 4 mg by mouth 3 (three) times daily.   11/09/2017 at Unknown time  . albuterol (PROVENTIL) (2.5 MG/3ML) 0.083% nebulizer solution Take 3 mLs (2.5 mg total) by nebulization every 6 (six) hours as needed for wheezing or shortness of breath. 75 mL 12 unknown  . colestipol (COLESTID) 1 g tablet Take 1 tablet (1 g total) by mouth 2 (two) times daily. (Patient not taking: Reported on 10/30/2017) 90 tablet 3 unknown  . inFLIXimab 500 mg in sodium chloride 0.9 % 200 mL Inject 500 mg into the vein every 8 (eight) weeks. 1 Dose 6 Taking  . OXYGEN Inhale 3 L/day into the lungs at bedtime.   Taking   Scheduled: . aspirin EC  81 mg Oral Daily  . budesonide (PULMICORT) nebulizer solution  0.5 mg Nebulization BID  . chlorhexidine gluconate (MEDLINE KIT)  15 mL Mouth Rinse BID  . Chlorhexidine Gluconate Cloth  6 each Topical Daily  . clopidogrel  75 mg Oral Daily  . ipratropium  0.5 mg Nebulization Q6H  . levalbuterol  0.63 mg Nebulization Q6H  . mouth rinse  15 mL Mouth Rinse 10 times per day  . methylPREDNISolone (SOLU-MEDROL) injection  80 mg Intravenous Q8H  . oseltamivir  75 mg Oral BID  . pantoprazole (PROTONIX) IV  40 mg Intravenous QHS  . [START ON 11/07/2017] potassium chloride  10 mEq Oral Daily  . sodium chloride flush  10-40 mL Intracatheter Q12H  . tapentadol  100 mg Oral Q12H   Continuous: . sodium chloride    . ceFEPime (MAXIPIME) IV Stopped (  11/06/17 0236)  . dextrose 5 % and 0.45% NaCl 50 mL/hr at 11/06/17 0400  .  heparin 1,050 Units/hr (11/06/17 0649)  . insulin (NOVOLIN-R) infusion 7.5 Units/hr (11/06/17 2035)  . levofloxacin (LEVAQUIN) IV Stopped (11/05/17 2145)  . potassium chloride    . propofol (DIPRIVAN) infusion 55 mcg/kg/min (11/06/17 0530)  . vancomycin Stopped (11/05/17 1953)   DHR:CBULAGTXMIWOE **OR** acetaminophen, fentaNYL (SUBLIMAZE) injection, fentaNYL (SUBLIMAZE) injection, levalbuterol, LORazepam, sodium chloride flush  Assesment: She was admitted with acute hypoxic respiratory failure requiring intubation and mechanical ventilation.  She had influenza A.  She had bilateral infiltrates suggesting pneumonia versus volume overload.  She had significant respiratory distress but looks more comfortable now.  She has COPD at baseline and has an exacerbation which is being treated with nebulizer treatments and steroids.  She has obstructive sleep apnea that complicates her situation  She has had acute decline in her ejection fraction and has had some heart failure.  She has acute kidney injury.  This is improving.  Diuretics have been withheld  Her troponin level is elevated likely from demand ischemia  She has diabetic ketoacidosis on insulin drip Active Problems:   DM type 2 causing vascular disease ([place])   Current smoker   ESOPHAGEAL STRICTURE   Esophageal reflux   Gastroparesis   Cardiac pacemaker in situ   COPD GOLD 0 / still smoking    OSA (obstructive sleep apnea)   Diabetic neuropathy ([facility])   Dysphagia   Crohn's disease without complication (HCC)   COPD exacerbation (H[facility])   Influenza A   Acute respiratory failure with hypoxia ([place])   Hyponatremia   Hypokalemia    Plan: Continue treatments.  She is not ready for extubation yet.  She has improved    LOS: 3 days   Emmily Pellegrin L 11/06/2017, 7:31 AM

## 2017-11-06 NOTE — Progress Notes (Signed)
[facility] PROGRESS NOTE  ASHLYE OVIEDO ION:[id number] DOB: 1966-03-08 DOA: 11/11/2017 PCP: [facility]  Interim summary and HPI 52 y.o. female,  w Copd on home o2, apparently ran out of her breathing treatments and c/o increase in dyspnea. Pt states that her breathing started to become worse on Friday.  + cough w yellow sputum.  Denies fever, chills, cp, palpitations, brbpr.   Pt notes n/v, general malaise and diarrhea over the past 2 days.   In ED, pox on RA was in the mid to high 80's; found to have positive influenza A. CXR also demonstrated coarse interstitial marking and vascular congestion; unable to r/o underlying infiltrates. WBC's 12.6, fever 102.6, RR 25  Of, note patient was also found to be on DKA and 2-D Echo demonstrated EF of 30% with concerns for Takotsubo, cardiology following.   Assessment/Plan: 1-acute resp failure with hypoxia: in the setting of COPD exacerbation and influenza A. -continue ventilatory support -appreciate assistance by pulmonary/critical care service -continue steroids, Duoneb and antibiotics -continue pulmicort BID and atrovent QID -not ready for weaning from vent at this time  2-DKA: -in the setting of steroid use and infection and possibly invokana (currently being held) -Continue DKA protocol and IV insulin for now, acidosis is correcting, as patient remains NPO, the safest option is to continue the IV insulin infusion as we plan to start tube feeding today.   -follow CBGs -follow BMP  3-acute Cardiomyopathy: concern for takotsubo; positive apical wall motion abnormalities and decrease EF; no prior hx of CAD or heart failure. -cardiology consulted and working patient up -following rec's requested PICC line to check CVP and COX -continue lasix -given low BP now, will avoid the use of B-blocker, ARB/ACEI -daily weights and strict intake and output  4-influenza A -will continue providing supportive care and  tamiflu  5-GERD/GI prophylaxis -continue PPI  6-Sepsis: -code sepsis initiated -continue levaquin, cefepime and vancomycin   7. Elevated troponin / ACS - cardiology starting patient on IV heparin infusion.  Code Status: Full Family Communication: no family at bedside Disposition Plan: remains in ICU, continue ventilatory support, IV broad spectrum antibiotics; will follow cardiology recs and continue treatment for DKA per protocol.  Consultants:  Cardiology  Pulmonary/critical care  Procedures:  See below for x-ray reports   2-D echo: EF 30%, apical WMA  Antibiotics:  Cefepime, vancomycin and levaquin 2/12  HPI/Subjective: Currently afebrile and sedated on vent.    Objective: Vitals:   11/06/17 0545 11/06/17 0600  BP: 91/63 (!) 88/59  Pulse: 68 65  Resp: (!) 29 (!) 30  Temp:    SpO2: 93% 93%    Intake/Output Summary (Last 24 hours) at 11/06/2017 0742 Last data filed at 11/06/2017 0647 Gross per 24 hour  Intake 4457.63 ml  Output 1800 ml  Net 2657.63 ml   Filed Weights   11/04/17 0200 11/05/17 0500 11/06/17 0401  Weight: 90.9 kg (200 lb 6.4 oz) 92.1 kg (203 lb 0.7 oz) 94 kg (207 lb 3.7 oz)    Exam:   General:  Afebrile, sedated and ventilated.  Cardiovascular: normal S1 and S2, no rubs or gallops.   Respiratory: expiratory wheezes heard bilateral better air movement noted.  Abdomen: obese, soft, NT, ND, positive BS  Musculoskeletal: SCDs bilateral LEs, no cyanosis, no clubbing   Data Reviewed: Basic Metabolic Panel: Recent Labs  Lab 11/04/17 1619 11/04/17 2108 11/04/17 2358 11/05/17 0458 11/06/17 0513 11/06/17 0610  NA 138 137 136 142 140  --  K 3.5 3.6 3.5 3.4* 3.2*  --   CL 108 106 106 109 110  --   CO2 17* 18* 19* 19* 18*  --   GLUCOSE 265* 134* 168* 158* 174*  --   BUN 40* 41* 44* 48* 51*  --   CREATININE 1.28* 1.31* 1.44* 1.48* 1.33*  --   CALCIUM 8.7* 8.5* 8.5* 8.6* 8.7*  --   MG  --   --   --   --   --  1.9   Liver Function  Tests: Recent Labs  Lab 11/02/2017 1752 11/04/17 0433  AST 31 42*  ALT 27 25  ALKPHOS 68 67  BILITOT 0.9 1.2  PROT 7.4 7.3  ALBUMIN 3.1* 3.0*   Recent Labs  Lab 10/31/2017 1806  LIPASE 19   CBC: Recent Labs  Lab 11/11/2017 1752 11/04/17 0433 11/06/17 0513  WBC 12.6* 11.4* 12.9*  NEUTROABS 9.4*  --   --   HGB 16.1* 15.6* 12.6  HCT 48.1* 47.8* 38.4  MCV 92.9 95.0 92.5  PLT 123* 118* 156   Cardiac Enzymes: Recent Labs  Lab 11/04/17 2108 11/05/17 0458 11/05/17 0909 11/05/17 1439 11/05/17 2036  TROPONINI 1.04* 1.26* 1.18* 1.30* 1.68*   BNP (last 3 results) Recent Labs    09/20/17 2346 11/05/2017 1753 11/04/17 1619  BNP 227.0* 36.0 368.0*    CBG: Recent Labs  Lab 11/06/17 0243 11/06/17 0355 11/06/17 0445 11/06/17 0557 11/06/17 0642  GLUCAP 154* 168* 166* 160* 156*    Recent Results (from the past 240 hour(s))  Blood Culture (routine x 2)     Status: None (Preliminary result)   Collection Time: 10/24/2017  6:27 PM  Result Value Ref Range Status   Specimen Description RIGHT ANTECUBITAL  Final   Special Requests   Final    BOTTLES DRAWN AEROBIC AND ANAEROBIC Blood Culture adequate volume   Culture   Final    NO GROWTH 2 DAYS Performed at [facility], [address], [place], [place] [address]    Report Status PENDING  Incomplete  Blood Culture (routine x 2)     Status: None (Preliminary result)   Collection Time: 11/15/2017  6:27 PM  Result Value Ref Range Status   Specimen Description RIGHT ANTECUBITAL  Final   Special Requests   Final    BOTTLES DRAWN AEROBIC AND ANAEROBIC Blood Culture adequate volume   Culture   Final    NO GROWTH 2 DAYS Performed at [facility], [address], [place], [place] [address]    Report Status PENDING  Incomplete  MRSA PCR Screening     Status: None   Collection Time: 11/04/17  1:26 AM  Result Value Ref Range Status   MRSA by PCR NEGATIVE NEGATIVE Final    Comment:        The GeneXpert MRSA Assay (FDA approved for  NASAL specimens only), is one component of a comprehensive MRSA colonization surveillance program. It is not intended to diagnose MRSA infection nor to guide or monitor treatment for MRSA infections. Performed at [facility], [address] [place], [place] [address]      Studies: Portable Chest Xray  Result Date: 11/05/2017 CLINICAL DATA:  Respiratory failure.  COPD. EXAM: PORTABLE CHEST 1 VIEW COMPARISON:  Portable chest x-ray of November 12, 2017 FINDINGS: The patient is rotated on this study which limits comparison with the previous study. The lungs are well-expanded. The interstitial markings remain increased bilaterally. There are confluent infiltrates in the right perihilar and left  perihilar regions which are stable. The cardiac silhouette is mildly enlarged. The pulmonary vascularity is not clearly engorged. The endotracheal tube tip lies approximately 5.1 cm above the carina. The esophagogastric tube tip in proximal port lie below the left hemidiaphragm. The ICD is grossly stable in appearance. IMPRESSION: Stable bilateral perihilar atelectasis or pneumonia. Mild pulmonary interstitial edema, stable. The support tubes are in reasonable position. Electronically Signed   By: David  Martinique M.D.   On: 11/05/2017 07:49    Scheduled Meds: . aspirin EC  81 mg Oral Daily  . budesonide (PULMICORT) nebulizer solution  0.5 mg Nebulization BID  . chlorhexidine gluconate (MEDLINE KIT)  15 mL Mouth Rinse BID  . Chlorhexidine Gluconate Cloth  6 each Topical Daily  . clopidogrel  75 mg Oral Daily  . ipratropium  0.5 mg Nebulization Q6H  . levalbuterol  0.63 mg Nebulization Q6H  . mouth rinse  15 mL Mouth Rinse 10 times per day  . methylPREDNISolone (SOLU-MEDROL) injection  80 mg Intravenous Q8H  . oseltamivir  75 mg Oral BID  . pantoprazole (PROTONIX) IV  40 mg Intravenous QHS  . [START ON 11/07/2017] potassium chloride  10 mEq Oral Daily  . sodium chloride flush  10-40 mL Intracatheter Q12H   . tapentadol  100 mg Oral Q12H   Continuous Infusions: . sodium chloride    . ceFEPime (MAXIPIME) IV Stopped (11/06/17 0236)  . dextrose 5 % and 0.45% NaCl 50 mL/hr at 11/06/17 0400  . heparin 1,050 Units/hr (11/06/17 0649)  . insulin (NOVOLIN-R) infusion 7.5 Units/hr (11/06/17 4695)  . levofloxacin (LEVAQUIN) IV Stopped (11/05/17 2145)  . potassium chloride    . propofol (DIPRIVAN) infusion 55 mcg/kg/min (11/06/17 0530)  . vancomycin Stopped (11/05/17 1953)    Active Problems:   DM type 2 causing vascular disease ([place])   Current smoker   ESOPHAGEAL STRICTURE   Esophageal reflux   Gastroparesis   Cardiac pacemaker in situ   COPD GOLD 0 / still smoking    OSA (obstructive sleep apnea)   Diabetic neuropathy ([facility])   Dysphagia   Crohn's disease without complication ([place])   COPD exacerbation ([place])   Influenza A   Acute respiratory failure with hypoxia ([place])   Hyponatremia   Hypokalemia  Critical Care Time spent: 34 minutes  Ethelene Closser [employer] [phone]. If 7PM-7AM, please contact night-coverage at [email or web address], password [facility] 11/06/2017, 7:42 AM  LOS: 3 days

## 2017-11-06 NOTE — Progress Notes (Addendum)
Initial Nutrition Assessment  DOCUMENTATION CODES:  Obesity unspecified  INTERVENTION:  Patient remains on high dose propofol. To minimize overfeeding, will need to mostly use modulars and keep TF at minimal rate. Vital High Protein @ 10 cc/hr w/ 60 ml Prostat TID to provide 840 kcals (+792 from Diprivan), 111g Pro, 201 ml fluid.   Would recommend rechecking TG, was 328 shortly after intubation, however has been on high dose propofol for 48 hrs. Per ASPEN recommendations, optimal levels are <400 while on continuous IV lipids  NUTRITION DIAGNOSIS:  Inadequate oral intake related to inability to eat as evidenced by NPO status.  GOAL:  Provide needs based on ASPEN/SCCM guidelines  MONITOR:  Diet advancement, Vent status, Labs, Weight trends, TF tolerance  REASON FOR ASSESSMENT:  Consult Enteral/tube feeding initiation and management  ASSESSMENT:  52 y/o female PMHx Crohns, GERD, Depression/Anxiety, COPD on Home o2, CVA, DM2, PTSD, CHF, OSA, HTN. Presented with abd pain, cough, congestion, emesis, diarrhea for several days. Had been off 02 d/t insurance issues. Found to be Influenza A positive. D/t worsening respiratory failure, intubated 2/12  Pt remains intubated, sedated on high dose propofol. Still, unable to elicit any intake history and no family, friends or other historians present.   Has gone ~72 hrs without feeding  She had presented w/ n/v/d. Reasonable to conclude she hadnt been eating as well, however, unable to confirm. She does appears to have had acute weight loss. She was 212 at outpatient infusion 2 weeks ago, now about 200 lbs.   Per chart, her UBW for the past couple years appears to be 205-215 lbs. Has had acute periods outside this range, She has HF and on diuretics.   DKA resolved, bg now under control. No new TG level  Patient is currently intubated on ventilator support MV: 16.1 L/min Temp (24hrs), Avg:97.8 F (36.6 C), Min:97.4 F (36.3 C), Max:98.4 F  (36.9 C) Propofol: 30 ml/hr = 792 kcals  Physical Exam: Abdomen is soft.   Labs: BGs: now 150-180, BUN/Creat: 51/1.33 (slight increase), TG 328, WBC:12.9 (slight increase) Meds: methylprednisolone, PPI, KCL, Tamiflu,  IV abx, Propofol, IVF, Insulin GTT  Recent Labs  Lab 11/04/17 2358 11/05/17 0458 11/06/17 0513 11/06/17 0610  NA 136 142 140  --   K 3.5 3.4* 3.2*  --   CL 106 109 110  --   CO2 19* 19* 18*  --   BUN 44* 48* 51*  --   CREATININE 1.44* 1.48* 1.33*  --   CALCIUM 8.5* 8.6* 8.7*  --   MG  --   --   --  1.9  GLUCOSE 168* 158* 174*  --    NUTRITION - FOCUSED PHYSICAL EXAM: WDL  Diet Order:  Diet NPO time specified  EDUCATION NEEDS:  No education needs have been identified at this time  Skin:  Skin Assessment: Reviewed RN Assessment  Last BM:  2/12  Height:  Ht Readings from Last 1 Encounters:  11/06/17 5\' 4"  (1.626 m)   Weight:  Wt Readings from Last 1 Encounters:  11/06/17 207 lb 3.7 oz (94 kg)   Wt Readings from Last 10 Encounters:  11/06/17 207 lb 3.7 oz (94 kg)  10/21/17 212 lb (96.2 kg)  10/09/17 212 lb (96.2 kg)  09/20/17 197 lb (89.4 kg)  08/26/17 210 lb (95.3 kg)  07/08/17 207 lb (93.9 kg)  06/25/17 214 lb (97.1 kg)  05/23/17 215 lb (97.5 kg)  05/05/17 215 lb 6.4 oz (97.7 kg)  04/30/17 215  lb (97.5 kg)   Ideal Body Weight:  54.54 kg  BMI:  Body mass index is 35.57 kg/m.  Dosing weight: 91.08 kg Estimated Nutritional Needs:  Kcal:  1000-1275 kcals (11-14 kcal/kg bw) Protein:  >109 g Pro (2g/kg ibw) Fluid:  Per MD goals  Christophe Louis RD, LDN, CNSC Clinical Nutrition Pager: [phone] 11/06/2017 12:34 PM

## 2017-11-07 ENCOUNTER — Inpatient Hospital Stay (HOSPITAL_COMMUNITY): Payer: Medicaid Other

## 2017-11-07 DIAGNOSIS — E876 Hypokalemia: Secondary | ICD-10-CM

## 2017-11-07 DIAGNOSIS — K219 Gastro-esophageal reflux disease without esophagitis: Secondary | ICD-10-CM

## 2017-11-07 LAB — GLUCOSE, CAPILLARY
GLUCOSE-CAPILLARY: 141 mg/dL — AB (ref 65–99)
GLUCOSE-CAPILLARY: 147 mg/dL — AB (ref 65–99)
GLUCOSE-CAPILLARY: 149 mg/dL — AB (ref 65–99)
GLUCOSE-CAPILLARY: 160 mg/dL — AB (ref 65–99)
GLUCOSE-CAPILLARY: 162 mg/dL — AB (ref 65–99)
GLUCOSE-CAPILLARY: 181 mg/dL — AB (ref 65–99)
Glucose-Capillary: 186 mg/dL — ABNORMAL HIGH (ref 65–99)
Glucose-Capillary: 163 mg/dL — ABNORMAL HIGH (ref 65–99)
Glucose-Capillary: 135 mg/dL — ABNORMAL HIGH (ref 65–99)
Glucose-Capillary: 141 mg/dL — ABNORMAL HIGH (ref 65–99)
Glucose-Capillary: 155 mg/dL — ABNORMAL HIGH (ref 65–99)
Glucose-Capillary: 128 mg/dL — ABNORMAL HIGH (ref 65–99)
Glucose-Capillary: 173 mg/dL — ABNORMAL HIGH (ref 65–99)
Glucose-Capillary: 140 mg/dL — ABNORMAL HIGH (ref 65–99)
Glucose-Capillary: 130 mg/dL — ABNORMAL HIGH (ref 65–99)
Glucose-Capillary: 168 mg/dL — ABNORMAL HIGH (ref 65–99)
Glucose-Capillary: 167 mg/dL — ABNORMAL HIGH (ref 65–99)
Glucose-Capillary: 145 mg/dL — ABNORMAL HIGH (ref 65–99)
Glucose-Capillary: 150 mg/dL — ABNORMAL HIGH (ref 65–99)

## 2017-11-07 LAB — BLOOD GAS, ARTERIAL
Acid-base deficit: 7.3 mmol/L — ABNORMAL HIGH (ref 0.0–2.0)
BICARBONATE: 18.8 mmol/L — AB (ref 20.0–28.0)
Drawn by: 22223
FIO2: 50
LHR: 22 {breaths}/min
O2 Saturation: 96.8 %
PEEP/CPAP: 5 cmH2O
VT: 500 mL
pCO2 arterial: 31.3 mmHg — ABNORMAL LOW (ref 32.0–48.0)
pH, Arterial: 7.357 (ref 7.350–7.450)
pO2, Arterial: 102 mmHg (ref 83.0–108.0)

## 2017-11-07 LAB — BASIC METABOLIC PANEL
ANION GAP: 10 (ref 5–15)
BUN: 55 mg/dL — ABNORMAL HIGH (ref 6–20)
CHLORIDE: 114 mmol/L — AB (ref 101–111)
CO2: 17 mmol/L — AB (ref 22–32)
Calcium: 8.7 mg/dL — ABNORMAL LOW (ref 8.9–10.3)
Creatinine, Ser: 1.1 mg/dL — ABNORMAL HIGH (ref 0.44–1.00)
GFR calc Af Amer: >60 mL/min (ref >60)
GFR, EST NON AFRICAN AMERICAN: 57 mL/min — AB (ref >60)
GLUCOSE: 146 mg/dL — AB (ref 65–99)
POTASSIUM: 3.3 mmol/L — AB (ref 3.5–5.1)
Sodium: 141 mmol/L (ref 135–145)

## 2017-11-07 LAB — CBC
HEMATOCRIT: 37.1 % (ref 36.0–46.0)
HEMOGLOBIN: 12 g/dL (ref 12.0–15.0)
MCH: 30.4 pg (ref 26.0–34.0)
MCHC: 32.3 g/dL (ref 30.0–36.0)
MCV: 93.9 fL (ref 78.0–100.0)
Platelets: 185 10*3/uL (ref 150–400)
RBC: 3.95 MIL/uL (ref 3.87–5.11)
RDW: 14.8 % (ref 11.5–15.5)
WBC: 16.6 10*3/uL — ABNORMAL HIGH (ref 4.0–10.5)

## 2017-11-07 LAB — LIPID PANEL
CHOL/HDL RATIO: 6.2 ratio
CHOLESTEROL: 179 mg/dL (ref 0–200)
HDL: 29 mg/dL — AB (ref >40)
LDL Cholesterol: 90 mg/dL (ref 0–99)
Triglycerides: 298 mg/dL — ABNORMAL HIGH (ref <150)
VLDL: 60 mg/dL — ABNORMAL HIGH (ref 0–40)

## 2017-11-07 LAB — MAGNESIUM: MAGNESIUM: 2.2 mg/dL (ref 1.7–2.4)

## 2017-11-07 LAB — TRIGLYCERIDES: Triglycerides: 302 mg/dL — ABNORMAL HIGH

## 2017-11-07 LAB — HEPARIN LEVEL (UNFRACTIONATED): Heparin Unfractionated: 0.25 IU/mL — ABNORMAL LOW (ref 0.30–0.70)

## 2017-11-07 MED ORDER — HEPARIN SODIUM (PORCINE) 5000 UNIT/ML IJ SOLN
5000.0000 [IU] | Freq: Three times a day (TID) | INTRAMUSCULAR | Status: DC
Start: 2017-11-07 — End: 2017-11-16
  Administered 2017-11-07 – 2017-11-15 (×26): 5000 [IU] via SUBCUTANEOUS
  Filled 2017-11-07 (×27): qty 1

## 2017-11-07 MED ORDER — POTASSIUM CHLORIDE 10 MEQ/100ML IV SOLN
10.0000 meq | INTRAVENOUS | Status: AC
  Administered 2017-11-07 (×5): 10 meq via INTRAVENOUS
  Filled 2017-11-07 (×4): qty 100

## 2017-11-07 MED ORDER — SODIUM CHLORIDE 0.9 % IV SOLN
INTRAVENOUS | Status: AC
  Filled 2017-11-07: qty 1

## 2017-11-07 MED ORDER — ATORVASTATIN CALCIUM 80 MG PO TABS
80.0000 mg | ORAL_TABLET | Freq: Every day | ORAL | Status: DC
  Administered 2017-11-07 – 2017-11-20 (×11): 80 mg via ORAL
  Filled 2017-11-07 (×2): qty 2
  Filled 2017-11-07: qty 1
  Filled 2017-11-07 (×2): qty 2
  Filled 2017-11-07 (×3): qty 1
  Filled 2017-11-07 (×3): qty 2

## 2017-11-07 MED ORDER — HEPARIN BOLUS VIA INFUSION
1400.0000 [IU] | Freq: Once | INTRAVENOUS | Status: AC
  Administered 2017-11-07: 1400 [IU] via INTRAVENOUS
  Filled 2017-11-07: qty 1400

## 2017-11-07 NOTE — Progress Notes (Signed)
[facility] PROGRESS NOTE  [name] VOZ:[id number] DOB: [birth year] DOA: 10/26/2017 PCP: [facility] [place]  Interim summary and HPI 52 y.o. female,  w Copd on home O2, apparently ran out of her breathing treatments and c/o increase in dyspnea. Pt states that her breathing started to become worse on Friday.  + cough w yellow sputum.  Denies fever, chills, cp, palpitations, brbpr.   Pt notes n/v, general malaise and diarrhea over the past 2 days.   In ED, pox on RA was in the mid to high 80's; found to have positive influenza A. CXR also demonstrated coarse interstitial marking and vascular congestion; unable to r/o underlying infiltrates. WBC's 12.6, fever 102.6, RR 25  Of, note patient was also found to be on DKA and 2-D Echo demonstrated EF of 30% with concerns for Takotsubo, cardiology following.   Assessment/Plan: 1-acute resp failure with hypoxia: in the setting of COPD exacerbation and influenza A and pneumonia -continue ventilatory support -appreciate assistance by pulmonary/critical care service -continue steroids, Duoneb and antibiotics -continue pulmicort BID and atrovent QID -not ready for weaning from vent at this time per pulmonary team -continue current antibiotics for pneumonia coverage.  2-DKA: -in the setting of steroid use and infection and possibly invokana (currently being held) -Continue DKA protocol and IV insulin for now, acidosis is correcting, as patient remains NPO, the safest option is to continue the IV insulin infusion as she is on continuous tube feeding and sedated on the vent.    -follow CBGs -follow BMP  3-acute Cardiomyopathy: concern for takotsubo; positive apical wall motion abnormalities and decrease EF; no prior hx of CAD or heart failure.  -cardiology consulted and working patient up -PICC line to check CVP and COX -diuretics per cardiology service, have been held last couple of days, likely to resume soon -given low BP  now, will avoid the use of B-blocker, ARB/ACEI -daily weights and strict intake and output  Filed Weights   11/05/17 0500 11/06/17 0401 11/07/17 0500  Weight: 92.1 kg (203 lb 0.7 oz) 94 kg (207 lb 3.7 oz) 95.8 kg (211 lb 3.2 oz)   4-influenza A -will continue providing supportive care and tamiflu  5-GERD/GI prophylaxis -continue PPI  6-Sepsis: clinically improving  -code sepsis initiated -continue cefepime  7. Elevated troponin / ACS - cardiology starting patient on IV heparin infusion.  Code Status: Full Family Communication: no family at bedside Disposition Plan: remains in ICU, continue ventilatory support, IV broad spectrum antibiotics; will follow cardiology recs and continue treatment for DKA per protocol.  Consultants:  Cardiology  Pulmonary/critical care  Procedures:  See below for x-ray reports   2-D echo: EF 30%, apical WMA  Antibiotics:  Cefepime, vancomycin and levaquin 2/12  HPI/Subjective: Currently afebrile and sedated on vent.    Objective: Vitals:   11/07/17 0600 11/07/17 0630  BP: (!) 81/61 98/72  Pulse: 62 (!) 59  Resp: (!) 26 (!) 23  Temp:    SpO2: 94% 94%    Intake/Output Summary (Last 24 hours) at 11/07/2017 0644 Last data filed at 11/07/2017 0500 Gross per 24 hour  Intake 2487.18 ml  Output 2030 ml  Net 457.18 ml   Filed Weights   11/05/17 0500 11/06/17 0401 11/07/17 0500  Weight: 92.1 kg (203 lb 0.7 oz) 94 kg (207 lb 3.7 oz) 95.8 kg (211 lb 3.2 oz)    Exam:   General:  Afebrile, sedated and ventilated.  Cardiovascular: normal S1 and S2, no rubs or gallops.  Respiratory: expiratory wheezes heard bilateral better air movement noted. Crackles heard bases.   Abdomen: obese, soft, NT, ND, positive BS.  Musculoskeletal: SCDs bilateral LEs, no cyanosis, no clubbing   Data Reviewed: Basic Metabolic Panel: Recent Labs  Lab 11/04/17 1619 11/04/17 2108 11/04/17 2358 11/05/17 0458 11/06/17 0513 11/06/17 0610  NA 138 137  136 142 140  --   K 3.5 3.6 3.5 3.4* 3.2*  --   CL 108 106 106 109 110  --   CO2 17* 18* 19* 19* 18*  --   GLUCOSE 265* 134* 168* 158* 174*  --   BUN 40* 41* 44* 48* 51*  --   CREATININE 1.28* 1.31* 1.44* 1.48* 1.33*  --   CALCIUM 8.7* 8.5* 8.5* 8.6* 8.7*  --   MG  --   --   --   --   --  1.9   Liver Function Tests: Recent Labs  Lab 11/20/2017 1752 11/04/17 0433  AST 31 42*  ALT 27 25  ALKPHOS 68 67  BILITOT 0.9 1.2  PROT 7.4 7.3  ALBUMIN 3.1* 3.0*   Recent Labs  Lab 11/01/2017 1806  LIPASE 19   CBC: Recent Labs  Lab 11/09/2017 1752 11/04/17 0433 11/06/17 0513  WBC 12.6* 11.4* 12.9*  NEUTROABS 9.4*  --   --   HGB 16.1* 15.6* 12.6  HCT 48.1* 47.8* 38.4  MCV 92.9 95.0 92.5  PLT 123* 118* 156   Cardiac Enzymes: Recent Labs  Lab 11/05/17 0458 11/05/17 0909 11/05/17 1439 11/05/17 2036 11/06/17 0909  TROPONINI 1.26* 1.18* 1.30* 1.68* 1.47*   BNP (last 3 results) Recent Labs    09/20/17 2346 11/02/2017 1753 11/04/17 1619  BNP 227.0* 36.0 368.0*    CBG: Recent Labs  Lab 11/07/17 0120 11/07/17 0219 11/07/17 0352 11/07/17 0502 11/07/17 0601  GLUCAP 186* 163* 135* 141* 147*    Recent Results (from the past 240 hour(s))  Blood Culture (routine x 2)     Status: None (Preliminary result)   Collection Time: 11/05/2017  6:27 PM  Result Value Ref Range Status   Specimen Description RIGHT ANTECUBITAL  Final   Special Requests   Final    BOTTLES DRAWN AEROBIC AND ANAEROBIC Blood Culture adequate volume   Culture   Final    NO GROWTH 4 DAYS Performed at [facility], [address] [place], [place] [address]    Report Status PENDING  Incomplete  Blood Culture (routine x 2)     Status: None (Preliminary result)   Collection Time: 10/27/2017  6:27 PM  Result Value Ref Range Status   Specimen Description RIGHT ANTECUBITAL  Final   Special Requests   Final    BOTTLES DRAWN AEROBIC AND ANAEROBIC Blood Culture adequate volume   Culture   Final    NO GROWTH 4  DAYS Performed at [facility], [address] [place], [place] [address]    Report Status PENDING  Incomplete  MRSA PCR Screening     Status: None   Collection Time: 11/04/17  1:26 AM  Result Value Ref Range Status   MRSA by PCR NEGATIVE NEGATIVE Final    Comment:        The GeneXpert MRSA Assay (FDA approved for NASAL specimens only), is one component of a comprehensive MRSA colonization surveillance program. It is not intended to diagnose MRSA infection nor to guide or monitor treatment for MRSA infections. Performed at [facility], [address], [place], [place] [address]  Studies: Dg Chest Port 1 View  Result Date: 11/06/2017 CLINICAL DATA:  Respiratory failure.  COPD. EXAM: PORTABLE CHEST 1 VIEW COMPARISON:  11/05/2016.  11/04/2017. FINDINGS: Endotracheal tube and NG tube in stable position. Cardiac pacer in stable position. Heart size stable. Diffuse bilateral pulmonary infiltrates/edema, left side greater than right. Similar findings noted on prior exams. No prominent pleural effusion. No pneumothorax. IMPRESSION: 1.  Endotracheal tube and NG tube stable position. 2.  Cardiac pacer in stable position.  Heart size stable. 3. Diffuse bilateral pulmonary infiltrates/edema, left side greater than right. Similar findings noted on prior exams. Electronically Signed   By: Marcello Moores  Register   On: 11/06/2017 05:55    Scheduled Meds: . aspirin EC  81 mg Oral Daily  . budesonide (PULMICORT) nebulizer solution  0.5 mg Nebulization BID  . chlorhexidine gluconate (MEDLINE KIT)  15 mL Mouth Rinse BID  . Chlorhexidine Gluconate Cloth  6 each Topical Daily  . clopidogrel  75 mg Oral Daily  . feeding supplement (PRO-STAT SUGAR FREE 64)  60 mL Per Tube TID  . feeding supplement (VITAL HIGH PROTEIN)  1,000 mL Per Tube Q24H  . ipratropium  0.5 mg Nebulization Q6H  . levalbuterol  0.63 mg Nebulization Q6H  . mouth rinse  15 mL Mouth Rinse 10 times per day  . methylPREDNISolone  (SOLU-MEDROL) injection  80 mg Intravenous Q8H  . oseltamivir  75 mg Oral BID  . pantoprazole (PROTONIX) IV  40 mg Intravenous QHS  . potassium chloride  10 mEq Oral Daily  . sodium chloride flush  10-40 mL Intracatheter Q12H  . tapentadol  100 mg Oral Q12H   Continuous Infusions: . sodium chloride    . ceFEPime (MAXIPIME) IV Stopped (11/07/17 0252)  . dextrose 5 % and 0.45% NaCl 50 mL/hr at 11/07/17 0000  . heparin 1,050 Units/hr (11/07/17 0000)  . insulin (NOVOLIN-R) infusion 11.1 Units/hr (11/07/17 0604)  . propofol (DIPRIVAN) infusion 55 mcg/kg/min (11/07/17 0455)    Active Problems:   DM type 2 causing vascular disease ([place])   Current smoker   ESOPHAGEAL STRICTURE   Esophageal reflux   Gastroparesis   Cardiac pacemaker in situ   COPD GOLD 0 / still smoking    OSA (obstructive sleep apnea)   Diabetic neuropathy ([facility])   Dysphagia   Crohn's disease without complication ([place])   COPD exacerbation ([place])   Influenza A   Acute respiratory failure with hypoxia ([place])   Hyponatremia   Hypokalemia  Critical Care Time spent: 32 minutes  Chiann Goffredo [employer] [phone]. If 7PM-7AM, please contact night-coverage at [email or web address], password [facility] 11/07/2017, 6:44 AM  LOS: 4 days

## 2017-11-07 NOTE — Progress Notes (Signed)
Progress Note  Patient Name: [name] Date of Encounter: 11/07/2017  Primary Cardiologist: No primary care provider on file.   Subjective   No events overnight  Inpatient Medications    Scheduled Meds: . aspirin EC  81 mg Oral Daily  . budesonide (PULMICORT) nebulizer solution  0.5 mg Nebulization BID  . chlorhexidine gluconate (MEDLINE KIT)  15 mL Mouth Rinse BID  . Chlorhexidine Gluconate Cloth  6 each Topical Daily  . clopidogrel  75 mg Oral Daily  . feeding supplement (PRO-STAT SUGAR FREE 64)  60 mL Per Tube TID  . feeding supplement (VITAL HIGH PROTEIN)  1,000 mL Per Tube Q24H  . ipratropium  0.5 mg Nebulization Q6H  . levalbuterol  0.63 mg Nebulization Q6H  . mouth rinse  15 mL Mouth Rinse 10 times per day  . methylPREDNISolone (SOLU-MEDROL) injection  80 mg Intravenous Q8H  . oseltamivir  75 mg Oral BID  . pantoprazole (PROTONIX) IV  40 mg Intravenous QHS  . potassium chloride  10 mEq Oral Daily  . sodium chloride flush  10-40 mL Intracatheter Q12H  . tapentadol  100 mg Oral Q12H   Continuous Infusions: . sodium chloride    . ceFEPime (MAXIPIME) IV Stopped (11/07/17 0252)  . dextrose 5 % and 0.45% NaCl 50 mL/hr at 11/07/17 0000  . heparin 1,200 Units/hr (11/07/17 0809)  . insulin (NOVOLIN-R) infusion 16.7 Units/hr (11/07/17 0706)  . potassium chloride 10 mEq (11/07/17 0752)  . propofol (DIPRIVAN) infusion 55 mcg/kg/min (11/07/17 0455)   PRN Meds: acetaminophen **OR** acetaminophen, fentaNYL (SUBLIMAZE) injection, fentaNYL (SUBLIMAZE) injection, levalbuterol, LORazepam, sodium chloride flush   Vital Signs    Vitals:   11/07/17 0600 11/07/17 0630 11/07/17 0700 11/07/17 0800  BP: (!) 81/61 98/72 94/66 103/77  Pulse: 62 (!) 59 (!) 59 60  Resp: (!) 26 (!) 23 (!) 27 (!) 26  Temp:      TempSrc:      SpO2: 94% 94% 93% 99%  Weight:      Height:        Intake/Output Summary (Last 24 hours) at 11/07/2017 0831 Last data filed at 11/07/2017 0500 Gross per  24 hour  Intake 2362.43 ml  Output 1430 ml  Net 932.43 ml   Filed Weights   11/05/17 0500 11/06/17 0401 11/07/17 0500  Weight: 203 lb 0.7 oz (92.1 kg) 207 lb 3.7 oz (94 kg) 211 lb 3.2 oz (95.8 kg)    Telemetry    AVpaced - Personally Reviewed  ECG    n/a  Physical Exam   GEN: No acute distress.   Neck: No JVD Cardiac: RRR, no murmurs, rubs, or gallops.  Respiratory: Clear to auscultation bilaterally. GI: Soft, nontender, non-distended  MS: No edema; No deformity. Neuro:  Nonfocal  Psych: Normal affect   Labs    Chemistry Recent Labs  Lab 11/04/2017 1752 11/04/17 0433  11/05/17 0458 11/06/17 0513 11/07/17 0556  NA 135 135   < > 142 140 141  K 4.2 4.5   < > 3.4* 3.2* 3.3*  CL 100* 102   < > 109 110 114*  CO2 19* 15*   < > 19* 18* 17*  GLUCOSE 395* 453*   < > 158* 174* 146*  BUN 29* 27*   < > 48* 51* 55*  CREATININE 1.02* 0.95   < > 1.48* 1.33* 1.10*  CALCIUM 8.7* 8.2*   < > 8.6* 8.7* 8.7*  PROT 7.4 7.3  --   --   --   --  ALBUMIN 3.1* 3.0*  --   --   --   --   AST 31 42*  --   --   --   --   ALT 27 25  --   --   --   --   ALKPHOS 68 67  --   --   --   --   BILITOT 0.9 1.2  --   --   --   --   GFRNONAA >60 >60   < > 40* 45* 57*  GFRAA >60 >60   < > 46* 53* >60  ANIONGAP 16* 18*   < > _0 < > = values in this interval not displayed.     Hematology Recent Labs  Lab 11/04/17 0433 11/06/17 0513 11/07/17 0556  WBC 11.4* 12.9* 16.6*  RBC 5.03 4.15 3.95  HGB 15.6* 12.6 12.0  HCT 47.8* 38.4 37.1  MCV 95.0 92.5 93.9  MCH 31.0 30.4 30.4  MCHC 32.6 32.8 32.3  RDW 14.2 14.7 14.8  PLT 118* 156 185    Cardiac Enzymes Recent Labs  Lab 11/05/17 0909 11/05/17 1439 11/05/17 2036 11/06/17 0909  TROPONINI 1.18* 1.30* 1.68* 1.47*   No results for input(s): TROPIPOC in the last 168 hours.   BNP Recent Labs  Lab 11/18/2017 1753 11/04/17 1619  BNP 36.0 368.0*     DDimer No results for input(s): DDIMER in the last 168 hours.   Radiology    Dg  Chest Port 1 View  Result Date: 11/07/2017 CLINICAL DATA:  Respiratory failure. EXAM: PORTABLE CHEST 1 VIEW COMPARISON:  11/06/2017. 11/06/2017. 09/21/2017. CT chest 09/21/2017. FINDINGS: Endotracheal tube and NG tube in stable position. Cardiac pacer stable position. Heart size normal. Diffuse bilateral pulmonary infiltrates/edema, left side greater than right again noted. Similar findings on prior exam. Low lung volumes with basilar atelectasis. No prominent pleural effusion. No pneumothorax. IMPRESSION: 1.  Endotracheal tube and NG tube in stable position. 2.  Cardiac pacer stable position.  Heart size stable. 3. Persistent diffuse bilateral pulmonary infiltrates/edema, left side greater than right again noted. No significant change from prior exam. Low lung volumes with bibasilar atelectasis. Electronically Signed   By: Marcello Moores  Register   On: 11/07/2017 07:11   Dg Chest Port 1 View  Result Date: 11/06/2017 CLINICAL DATA:  Respiratory failure.  COPD. EXAM: PORTABLE CHEST 1 VIEW COMPARISON:  11/05/2016.  11/04/2017. FINDINGS: Endotracheal tube and NG tube in stable position. Cardiac pacer in stable position. Heart size stable. Diffuse bilateral pulmonary infiltrates/edema, left side greater than right. Similar findings noted on prior exams. No prominent pleural effusion. No pneumothorax. IMPRESSION: 1.  Endotracheal tube and NG tube stable position. 2.  Cardiac pacer in stable position.  Heart size stable. 3. Diffuse bilateral pulmonary infiltrates/edema, left side greater than right. Similar findings noted on prior exams. Electronically Signed   By: [place]   On: 11/06/2017 05:55    Cardiac Studies    Patient Profile     [name] a 52 y.o.femalewith a hx of sinus node dysfunction with pacemaker, COPD, Crohns who is being seen today for the evaluation of SOB at the request of Dr Dyann Kief.    Assessment & Plan    1. Acute systolic heart failure - LVEF down from normal to 30%  from just last month, primarily apical WMAs would suggest Takotsubo however this diagnosis requires the exclusion of CAD - suspect stress induced CM in setting of influenza and respiratory failure. ACS  less likely. Once more stable I think she may require a cath to confirm the diagnosis. - fairly mild troponin elevation given the degree of cardiac dysfunction, EKG remains stable without acute ischemic changes.  - peak trop 1.68, trending down  - diuretics held last few days  due to AKI and low CVP 6-7 in ventilated patient. Mixed venous O2 sat around 70% indicating appropriate cardiac output.  - CVP this AM 8. Would continue to hold diuretics - soft bp's have limited medical therapy for CHF, suspected component of sepsis and vasiodilation. Bp's too soft today to start meds.   - follow oxygenation and cvp over weekend, may require IV diuretics over the next few days. CVP of 12-16 would be considered normal in ventilated patient.  - if bp's improve over the weekend would start toprol 12.5mg daily.    2. Influenza - management per primary team.   3. Troponin elevation - fairly mild troponin elevation given the degree of cardiac dysfunction, EKG remains stable without acute ischemic changes.  - peak trop 1.64, trending down.  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 Soft bp's limit other medical therapy. Start statin atorva 80mg - she has completed 48 hrs of heparin, trop trending down. D/c heparin drip  - tropononin has not peaked, would continue heparin at this time.  - strongly suspect troponin elevation due to stress induced CM/Takotsubo and demand ischemia. Given her drop in LVEF and WMAs on echo this cannot be confirmed without a cath. Would could consider cath when more medically stable.   4. AKI - improving with holding diuretics yesterday - relatively low CVP would suggest prerenal.  5. COPD exacerbation - per pulmonary    For  questions or updates, please contact [facility] Please consult [email or web address] for contact info under Cardiology/STEMI.      Signed, , , MD  11/07/2017, 8:31 AM    

## 2017-11-07 NOTE — Progress Notes (Signed)
Subjective: She remains intubated and on the ventilator.  She has a combination of influenza, pneumonia and what appears to be an acute cardiomyopathy likely related to her viral illness.  Blood pressure remains relatively low.  Objective: Vital signs in last 24 hours: Temp:  [97.6 F (36.4 C)-98.5 F (36.9 C)] 97.9 F (36.6 C) (02/15 0400) Pulse Rate:  [58-80] 59 (02/15 0630) Resp:  [22-35] 23 (02/15 0630) BP: (81-111)/(58-81) 98/72 (02/15 0630) SpO2:  [89 %-98 %] 94 % (02/15 0630) FiO2 (%):  [50 %-55 %] 50 % (02/15 0428) Weight:  [95.8 kg (211 lb 3.2 oz)] 95.8 kg (211 lb 3.2 oz) (02/15 0500) Weight change: 1.8 kg (3 lb 15.5 oz) Last BM Date: 11/04/17  Intake/Output from previous day: 02/14 0701 - 02/15 0700 In: 2487.2 [I.V.:1996.4; NG/GT:90.8; IV Piggyback:400] Out: 1430 [Urine:1400; Emesis/NG output:30]  PHYSICAL EXAM General appearance: Intubated sedated on mechanical ventilation Resp: She has rales and rhonchi bilateral Cardio: regular rate and rhythm, S1, S2 normal, no murmur, click, rub or gallop GI: soft, non-tender; bowel sounds normal; no masses,  no organomegaly Extremities: extremities normal, atraumatic, no cyanosis or edema Skin warm and dry  Lab Results:  Results for orders placed or performed during the hospital encounter of 11/09/2017 (from the past 48 hour(s))  Glucose, capillary     Status: Abnormal   Collection Time: 11/05/17  7:30 AM  Result Value Ref Range   Glucose-Capillary 158 (H) 65 - 99 mg/dL  Urea nitrogen, urine     Status: None   Collection Time: 11/05/17  8:28 AM  Result Value Ref Range   Urea Nitrogen, Ur 653 Not Estab. mg/dL    Comment: (NOTE) Performed At: [facility] [place], [place] [id number] Rush Farmer MD [id number] Performed at [facility], [address] [place], [place] [address]   Creatinine, urine, random     Status: None   Collection Time: 11/05/17  8:28 AM  Result Value Ref Range   Creatinine, Urine 67.30 mg/dL    Comment: Performed at [facility], [address] [place], [place] [address]  Glucose, capillary     Status: Abnormal   Collection Time: 11/05/17  8:35 AM  Result Value Ref Range   Glucose-Capillary 156 (H) 65 - 99 mg/dL  .Cooxemetry Panel (carboxy, met, total hgb, O2 sat)     Status: Abnormal   Collection Time: 11/05/17  9:00 AM  Result Value Ref Range   Total hemoglobin 12.0 12.0 - 16.0 g/dL   O2 Saturation 72.9 %   Carboxyhemoglobin 0.6 0.5 - 1.5 %   Methemoglobin 1.2 0.0 - 1.5 %   Total oxygen content 12.1 (L) 15.0 - 23.0 mL/dL    Comment: Performed at [facility], [address] [place], [place] [address]  Troponin I (q 6hr x 3)     Status: Abnormal   Collection Time: 11/05/17  9:09 AM  Result Value Ref Range   Troponin I 1.18 (HH) <0.03 ng/mL    Comment: CRITICAL VALUE NOTED.  VALUE IS CONSISTENT WITH PREVIOUSLY REPORTED AND CALLED VALUE. Performed at [facility], [address] [place],  [address]   Glucose, capillary     Status: Abnormal   Collection Time: 11/05/17 10:41 AM  Result Value Ref Range   Glucose-Capillary 162 (H) 65 - 99 mg/dL  Glucose, capillary     Status: Abnormal   Collection Time: 11/05/17 11:41 AM  Result Value Ref Range   Glucose-Capillary 174 (H) 65 - 99 mg/dL  Glucose,  capillary     Status: Abnormal   Collection Time: 11/05/17 12:50 PM  Result Value Ref Range   Glucose-Capillary 162 (H) 65 - 99 mg/dL  Glucose, capillary     Status: Abnormal   Collection Time: 11/05/17  1:50 PM  Result Value Ref Range   Glucose-Capillary 157 (H) 65 - 99 mg/dL  Troponin I (q 6hr x 3)     Status: Abnormal   Collection Time: 11/05/17  2:39 PM  Result Value Ref Range   Troponin I 1.30 (HH) <0.03 ng/mL    Comment: CRITICAL RESULT CALLED TO, READ BACK BY AND VERIFIED WITH: ROBERTS, T AT 1530 ON 2.13.2019 BY ISLEY,B Performed at [facility], [address], [place], [place] [address]   Heparin level (unfractionated)      Status: None   Collection Time: 11/05/17  2:58 PM  Result Value Ref Range   Heparin Unfractionated 0.59 0.30 - 0.70 IU/mL    Comment:        IF HEPARIN RESULTS ARE BELOW EXPECTED VALUES, AND PATIENT DOSAGE HAS BEEN CONFIRMED, SUGGEST FOLLOW UP TESTING OF ANTITHROMBIN III LEVELS. Performed at [facility], [address] [place], [place] [address]   Glucose, capillary     Status: Abnormal   Collection Time: 11/05/17  3:04 PM  Result Value Ref Range   Glucose-Capillary 176 (H) 65 - 99 mg/dL  Glucose, capillary     Status: Abnormal   Collection Time: 11/05/17  4:10 PM  Result Value Ref Range   Glucose-Capillary 177 (H) 65 - 99 mg/dL  Glucose, capillary     Status: Abnormal   Collection Time: 11/05/17  5:18 PM  Result Value Ref Range   Glucose-Capillary 163 (H) 65 - 99 mg/dL  Glucose, capillary     Status: Abnormal   Collection Time: 11/05/17  6:17 PM  Result Value Ref Range   Glucose-Capillary 154 (H) 65 - 99 mg/dL  Glucose, capillary     Status: Abnormal   Collection Time: 11/05/17  7:26 PM  Result Value Ref Range   Glucose-Capillary 189 (H) 65 - 99 mg/dL   Comment 1 Notify RN    Comment 2 Document in Chart   Glucose, capillary     Status: Abnormal   Collection Time: 11/05/17  8:23 PM  Result Value Ref Range   Glucose-Capillary 196 (H) 65 - 99 mg/dL   Comment 1 Notify RN    Comment 2 Document in Chart   Troponin I (q 6hr x 3)     Status: Abnormal   Collection Time: 11/05/17  8:36 PM  Result Value Ref Range   Troponin I 1.68 (HH) <0.03 ng/mL    Comment: CRITICAL VALUE NOTED.  VALUE IS CONSISTENT WITH PREVIOUSLY REPORTED AND CALLED VALUE. Performed at [facility], [address], [place], [place] [address]   Glucose, capillary     Status: Abnormal   Collection Time: 11/05/17  9:23 PM  Result Value Ref Range   Glucose-Capillary 190 (H) 65 - 99 mg/dL   Comment 1 Notify RN    Comment 2 Document in Chart   Glucose, capillary     Status: Abnormal   Collection Time:  11/05/17 10:24 PM  Result Value Ref Range   Glucose-Capillary 160 (H) 65 - 99 mg/dL   Comment 1 Notify RN    Comment 2 Document in Chart   Glucose, capillary     Status: Abnormal   Collection Time: 11/05/17 11:25 PM  Result Value Ref Range   Glucose-Capillary 164 (  H) 65 - 99 mg/dL   Comment 1 Notify RN    Comment 2 Document in Chart   Glucose, capillary     Status: Abnormal   Collection Time: 11/06/17 12:46 AM  Result Value Ref Range   Glucose-Capillary 153 (H) 65 - 99 mg/dL  Glucose, capillary     Status: Abnormal   Collection Time: 11/06/17  1:27 AM  Result Value Ref Range   Glucose-Capillary 131 (H) 65 - 99 mg/dL   Comment 1 Notify RN    Comment 2 Document in Chart   Glucose, capillary     Status: Abnormal   Collection Time: 11/06/17  2:43 AM  Result Value Ref Range   Glucose-Capillary 154 (H) 65 - 99 mg/dL   Comment 1 Notify RN    Comment 2 Document in Chart   Glucose, capillary     Status: Abnormal   Collection Time: 11/06/17  3:55 AM  Result Value Ref Range   Glucose-Capillary 168 (H) 65 - 99 mg/dL   Comment 1 Notify RN    Comment 2 Document in Chart   Draw ABG 1 hour after initiation of ventilator     Status: Abnormal   Collection Time: 11/06/17  4:35 AM  Result Value Ref Range   FIO2 55.00    Delivery systems VENTILATOR    Mode PRESSURE REGULATED VOLUME CONTROL    VT 500 mL   LHR 22.0 resp/min   Peep/cpap 5.0 cm H20   pH, Arterial 7.378 7.350 - 7.450   pCO2 arterial 29.4 (L) 32.0 - 48.0 mmHg   pO2, Arterial 78.5 (L) 83.0 - 108.0 mmHg   Bicarbonate 19.3 (L) 20.0 - 28.0 mmol/L   O2 Saturation 94.0 %   Collection site LEFT RADIAL    Drawn by [id number]    Sample type ARTERIAL DRAW    Allens test (pass/fail) PASS PASS    Comment: Performed at [facility], [address] [place], [place] [address]  Glucose, capillary     Status: Abnormal   Collection Time: 11/06/17  4:45 AM  Result Value Ref Range   Glucose-Capillary 166 (H) 65 - 99 mg/dL   Comment 1 Notify RN     Comment 2 Document in Chart   Basic metabolic panel     Status: Abnormal   Collection Time: 11/06/17  5:13 AM  Result Value Ref Range   Sodium 140 135 - 145 mmol/L   Potassium 3.2 (L) 3.5 - 5.1 mmol/L   Chloride 110 101 - 111 mmol/L   CO2 18 (L) 22 - 32 mmol/L   Glucose, Bld 174 (H) 65 - 99 mg/dL   BUN 51 (H) 6 - 20 mg/dL   Creatinine, Ser 1.33 (H) 0.44 - 1.00 mg/dL   Calcium 8.7 (L) 8.9 - 10.3 mg/dL   GFR calc non Af Amer 45 (L) >60 mL/min   GFR calc Af Amer 53 (L) >60 mL/min    Comment: (NOTE) The eGFR has been calculated using the CKD EPI equation. This calculation has not been validated in all clinical situations. eGFR's persistently <60 mL/min signify possible Chronic Kidney Disease.    Anion gap 12 5 - 15    Comment: Performed at [facility], [address] [place], [place] [address]  Heparin level (unfractionated)     Status: None   Collection Time: 11/06/17  5:13 AM  Result Value Ref Range   Heparin Unfractionated 0.36 0.30 - 0.70 IU/mL    Comment:  IF HEPARIN RESULTS ARE BELOW EXPECTED VALUES, AND PATIENT DOSAGE HAS BEEN CONFIRMED, SUGGEST FOLLOW UP TESTING OF ANTITHROMBIN III LEVELS. Performed at [facility], [address], [place], [place] [address]   CBC     Status: Abnormal   Collection Time: 11/06/17  5:13 AM  Result Value Ref Range   WBC 12.9 (H) 4.0 - 10.5 K/uL   RBC 4.15 3.87 - 5.11 MIL/uL   Hemoglobin 12.6 12.0 - 15.0 g/dL   HCT 38.4 36.0 - 46.0 %   MCV 92.5 78.0 - 100.0 fL   MCH 30.4 26.0 - 34.0 pg   MCHC 32.8 30.0 - 36.0 g/dL   RDW 14.7 11.5 - 15.5 %   Platelets 156 150 - 400 K/uL    Comment: Performed at [facility], [address] [place], [place] [address]  Vancomycin, trough     Status: Abnormal   Collection Time: 11/06/17  5:13 AM  Result Value Ref Range   Vancomycin Tr 38 (HH) 15 - 20 ug/mL    Comment: CRITICAL RESULT CALLED TO, READ BACK BY AND VERIFIED WITH: SMITH,J AT 6:25AM ON 11/06/17 BY [facility] Performed at [facility], [address], [place], [place] [address]   Glucose, capillary     Status: Abnormal   Collection Time: 11/06/17  5:57 AM  Result Value Ref Range   Glucose-Capillary 160 (H) 65 - 99 mg/dL   Comment 1 Notify RN    Comment 2 Document in Chart   Magnesium     Status: None   Collection Time: 11/06/17  6:10 AM  Result Value Ref Range   Magnesium 1.9 1.7 - 2.4 mg/dL    Comment: Performed at [facility], [address] [place], [place] [address]  Glucose, capillary     Status: Abnormal   Collection Time: 11/06/17  6:42 AM  Result Value Ref Range   Glucose-Capillary 156 (H) 65 - 99 mg/dL   Comment 1 Notify RN    Comment 2 Document in Chart   Glucose, capillary     Status: Abnormal   Collection Time: 11/06/17  7:33 AM  Result Value Ref Range   Glucose-Capillary 159 (H) 65 - 99 mg/dL  Glucose, capillary     Status: Abnormal   Collection Time: 11/06/17  9:00 AM  Result Value Ref Range   Glucose-Capillary 157 (H) 65 - 99 mg/dL  Troponin I     Status: Abnormal   Collection Time: 11/06/17  9:09 AM  Result Value Ref Range   Troponin I 1.47 (HH) <0.03 ng/mL    Comment: CRITICAL VALUE NOTED.  VALUE IS CONSISTENT WITH PREVIOUSLY REPORTED AND CALLED VALUE. Performed at [facility], [address] [place], [place] [address]   Glucose, capillary     Status: Abnormal   Collection Time: 11/06/17 10:15 AM  Result Value Ref Range   Glucose-Capillary 151 (H) 65 - 99 mg/dL  .Cooxemetry Panel (carboxy, met, total hgb, O2 sat)     Status: Abnormal   Collection Time: 11/06/17 10:20 AM  Result Value Ref Range   Total hemoglobin 20.8 (H) 12.0 - 16.0 g/dL   O2 Saturation 69.1 %   Carboxyhemoglobin 0.5 0.5 - 1.5 %   Methemoglobin 1.0 0.0 - 1.5 %   Total oxygen content 19.8 15.0 - 23.0 mL/dL    Comment: Performed at [facility], [address] [place], [place] [address]  Glucose, capillary     Status: Abnormal   Collection Time: 11/06/17 11:45 AM  Result Value Ref Range  Glucose-Capillary  178 (H) 65 - 99 mg/dL  Glucose, capillary     Status: Abnormal   Collection Time: 11/06/17 12:23 PM  Result Value Ref Range   Glucose-Capillary 180 (H) 65 - 99 mg/dL  Glucose, capillary     Status: Abnormal   Collection Time: 11/06/17  1:58 PM  Result Value Ref Range   Glucose-Capillary 184 (H) 65 - 99 mg/dL  Glucose, capillary     Status: Abnormal   Collection Time: 11/06/17  3:12 PM  Result Value Ref Range   Glucose-Capillary 171 (H) 65 - 99 mg/dL  Glucose, capillary     Status: Abnormal   Collection Time: 11/06/17  4:23 PM  Result Value Ref Range   Glucose-Capillary 170 (H) 65 - 99 mg/dL  Glucose, capillary     Status: Abnormal   Collection Time: 11/06/17  7:24 PM  Result Value Ref Range   Glucose-Capillary 146 (H) 65 - 99 mg/dL  Glucose, capillary     Status: Abnormal   Collection Time: 11/06/17  8:57 PM  Result Value Ref Range   Glucose-Capillary 171 (H) 65 - 99 mg/dL  Glucose, capillary     Status: Abnormal   Collection Time: 11/06/17 10:09 PM  Result Value Ref Range   Glucose-Capillary 165 (H) 65 - 99 mg/dL  Glucose, capillary     Status: Abnormal   Collection Time: 11/06/17 11:19 PM  Result Value Ref Range   Glucose-Capillary 205 (H) 65 - 99 mg/dL  Glucose, capillary     Status: Abnormal   Collection Time: 11/06/17 11:59 PM  Result Value Ref Range   Glucose-Capillary 226 (H) 65 - 99 mg/dL  Glucose, capillary     Status: Abnormal   Collection Time: 11/07/17  1:20 AM  Result Value Ref Range   Glucose-Capillary 186 (H) 65 - 99 mg/dL  Glucose, capillary     Status: Abnormal   Collection Time: 11/07/17  2:19 AM  Result Value Ref Range   Glucose-Capillary 163 (H) 65 - 99 mg/dL  Glucose, capillary     Status: Abnormal   Collection Time: 11/07/17  3:52 AM  Result Value Ref Range   Glucose-Capillary 135 (H) 65 - 99 mg/dL  Draw ABG 1 hour after initiation of ventilator     Status: Abnormal   Collection Time: 11/07/17  5:00 AM  Result Value Ref Range   FIO2 50.00     Delivery systems VENTILATOR    Mode VOLUME SUPPORT    VT 500 mL   LHR 22 resp/min   Peep/cpap 5.0 cm H20   pH, Arterial 7.357 7.350 - 7.450   pCO2 arterial 31.3 (L) 32.0 - 48.0 mmHg   pO2, Arterial 102.0 83.0 - 108.0 mmHg   Bicarbonate 18.8 (L) 20.0 - 28.0 mmol/L   Acid-base deficit 7.3 (H) 0.0 - 2.0 mmol/L   O2 Saturation 96.8 %   Collection site LEFT RADIAL    Drawn by [id number]    Sample type ARTERIAL    Allens test (pass/fail) PASS PASS    Comment: Performed at [facility], [address] [place], [place] [address]  Glucose, capillary     Status: Abnormal   Collection Time: 11/07/17  5:02 AM  Result Value Ref Range   Glucose-Capillary 141 (H) 65 - 99 mg/dL  Glucose, capillary     Status: Abnormal   Collection Time: 11/07/17  6:01 AM  Result Value Ref Range   Glucose-Capillary 147 (H) 65 - 99 mg/dL    ABGS Recent Labs  11/07/17 0500  PHART 7.357  PO2ART 102.0  HCO3 18.8*   CULTURES Recent Results (from the past 240 hour(s))  Blood Culture (routine x 2)     Status: None (Preliminary result)   Collection Time: 11/04/2017  6:27 PM  Result Value Ref Range Status   Specimen Description RIGHT ANTECUBITAL  Final   Special Requests   Final    BOTTLES DRAWN AEROBIC AND ANAEROBIC Blood Culture adequate volume   Culture   Final    NO GROWTH 4 DAYS Performed at [facility], [address] [place], [place] [address]    Report Status PENDING  Incomplete  Blood Culture (routine x 2)     Status: None (Preliminary result)   Collection Time: 11/15/2017  6:27 PM  Result Value Ref Range Status   Specimen Description RIGHT ANTECUBITAL  Final   Special Requests   Final    BOTTLES DRAWN AEROBIC AND ANAEROBIC Blood Culture adequate volume   Culture   Final    NO GROWTH 4 DAYS Performed at [facility], [address], [place], [place] [address]    Report Status PENDING  Incomplete  MRSA PCR Screening     Status: None   Collection Time: 11/04/17  1:26 AM  Result Value Ref Range  Status   MRSA by PCR NEGATIVE NEGATIVE Final    Comment:        The GeneXpert MRSA Assay (FDA approved for NASAL specimens only), is one component of a comprehensive MRSA colonization surveillance program. It is not intended to diagnose MRSA infection nor to guide or monitor treatment for MRSA infections. Performed at [facility], [address], [place], [place] [address]    Studies/Results: Dg Chest Port 1 View  Result Date: 11/06/2017 CLINICAL DATA:  Respiratory failure.  COPD. EXAM: PORTABLE CHEST 1 VIEW COMPARISON:  11/05/2016.  11/04/2017. FINDINGS: Endotracheal tube and NG tube in stable position. Cardiac pacer in stable position. Heart size stable. Diffuse bilateral pulmonary infiltrates/edema, left side greater than right. Similar findings noted on prior exams. No prominent pleural effusion. No pneumothorax. IMPRESSION: 1.  Endotracheal tube and NG tube stable position. 2.  Cardiac pacer in stable position.  Heart size stable. 3. Diffuse bilateral pulmonary infiltrates/edema, left side greater than right. Similar findings noted on prior exams. Electronically Signed   By: Marcello Moores  Register   On: 11/06/2017 05:55    Medications:  Prior to Admission:  Medications Prior to Admission  Medication Sig Dispense Refill Last Dose  . acetaminophen (TYLENOL) 500 MG tablet Take 500 mg by mouth every 6 (six) hours as needed for mild pain or moderate pain.   11/02/2017 at Unknown time  . aspirin EC 81 MG tablet Take 81 mg by mouth daily.   10/29/2017 at Unknown time  . carvedilol (COREG) 6.25 MG tablet Take 1 tablet (6.25 mg total) by mouth 2 (two) times daily. 180 tablet 3 11/15/2017 at Unknown time  . Cholecalciferol (VITAMIN D3) 5000 units CAPS Take 1 capsule (5,000 Units total) by mouth daily. 90 capsule 0 11/07/2017 at Unknown time  . clopidogrel (PLAVIX) 75 MG tablet Take 75 mg by mouth daily.   10/24/2017 at Unknown time  . diclofenac sodium (VOLTAREN) 1 % GEL Apply 4 g topically 4 (four)  times daily. (Patient taking differently: Apply 2-4 g topically daily as needed (for leg pain). ) 200 g 11 Past Month at Unknown time  . dicyclomine (BENTYL) 20 MG tablet Take 1 tablet (20 mg total) by mouth every 8 (eight)  hours as needed for spasms. 90 tablet 3 11/15/2017 at Unknown time  . diphenoxylate-atropine (LOMOTIL) 2.5-0.025 MG tablet Take 2 tabs by mouth x 1, then 1 tab every 4 hrs PRN, max of 8 tabs in 24 hour time 240 tablet 1 Past Week at Unknown time  . DULoxetine (CYMBALTA) 30 MG capsule Take 90 mg daily (60 mg + 30 mg) (Patient taking differently: Take 30 mg by mouth daily. ) 30 capsule 0 11/15/2017 at Unknown time  . DULoxetine (CYMBALTA) 60 MG capsule Take 90 mg daily (60 mg + 30 mg) 30 capsule 0 11/19/2017 at Unknown time  . furosemide (LASIX) 40 MG tablet TAKE (1) TABLET BY MOUTH EACH MORNING. 90 tablet 3 11/02/2017 at Unknown time  . ibuprofen (ADVIL,MOTRIN) 600 MG tablet Take 600 mg by mouth 2 (two) times daily as needed for moderate pain.    10/27/2017 at Unknown time  . insulin aspart (NOVOLOG FLEXPEN) 100 UNIT/ML FlexPen Inject 15-21 Units into the skin 3 (three) times daily with meals. 5 pen 2 10/24/2017 at Unknown time  . INVOKANA 100 MG TABS tablet TAKE 1 TABLET BY MOUTH DAILY BEFORE BREAKFAST. 30 tablet 2 11/15/2017 at Unknown time  . LEVEMIR FLEXTOUCH 100 UNIT/ML Pen INJECT 80 UNITS UNDER THE SKIN EVERY DAY AT 10 PM 30 mL 2 11/02/2017 at Unknown time  . lisinopril (PRINIVIL,ZESTRIL) 5 MG tablet Take 5 mg by mouth daily.   11/04/2017 at Unknown time  . LORazepam (ATIVAN) 0.5 MG tablet Take 1 tablet (0.5 mg total) by mouth daily as needed for anxiety. 30 tablet 0 Past Week at Unknown time  . LYRICA 150 MG capsule take 157m by mouth three times daily  0 10/25/2017 at Unknown time  . meloxicam (MOBIC) 15 MG tablet Take 15 mg by mouth daily.   11/09/2017 at Unknown time  . metFORMIN (GLUCOPHAGE) 1000 MG tablet TAKE 1 TABLET(1000 MG) BY MOUTH TWICE DAILY WITH A MEAL 60 tablet 3 11/08/2017  at Unknown time  . nitroGLYCERIN (NITROSTAT) 0.4 MG SL tablet Place 1 tablet (0.4 mg total) under the tongue every 5 (five) minutes as needed for chest pain. 30 tablet 0 unknown  . pantoprazole (PROTONIX) 40 MG tablet Take 1 tablet (40 mg total) by mouth 2 (two) times daily. 60 tablet 0 10/29/2017 at Unknown time  . potassium chloride (K-DUR) 10 MEQ tablet Take 1 tablet (10 mEq total) by mouth daily. 90 tablet 3 10/29/2017 at Unknown time  . pramipexole (MIRAPEX) 0.25 MG tablet Take 0.25 mg by mouth 2 (two) times daily.    10/24/2017 at Unknown time  . simvastatin (ZOCOR) 10 MG tablet TAKE (1) TABLET BY MOUTH DAILY. (Patient taking differently: TAKE (2) TABLET BY MOUTH DAILY.) 90 tablet 2 10/27/2017 at Unknown time  . tapentadol (NUCYNTA ER) 100 MG 12 hr tablet Take 100 mg by mouth every 12 (twelve) hours.   11/02/2017 at Unknown time  . tiZANidine (ZANAFLEX) 4 MG capsule Take 4 mg by mouth 3 (three) times daily.   11/02/2017 at Unknown time  . albuterol (PROVENTIL) (2.5 MG/3ML) 0.083% nebulizer solution Take 3 mLs (2.5 mg total) by nebulization every 6 (six) hours as needed for wheezing or shortness of breath. 75 mL 12 unknown  . colestipol (COLESTID) 1 g tablet Take 1 tablet (1 g total) by mouth 2 (two) times daily. (Patient not taking: Reported on 11/02/2017) 90 tablet 3 unknown  . inFLIXimab 500 mg in sodium chloride 0.9 % 200 mL Inject 500 mg into the vein every  8 (eight) weeks. 1 Dose 6 Taking  . OXYGEN Inhale 3 L/day into the lungs at bedtime.   Taking   Scheduled: . aspirin EC  81 mg Oral Daily  . budesonide (PULMICORT) nebulizer solution  0.5 mg Nebulization BID  . chlorhexidine gluconate (MEDLINE KIT)  15 mL Mouth Rinse BID  . Chlorhexidine Gluconate Cloth  6 each Topical Daily  . clopidogrel  75 mg Oral Daily  . feeding supplement (PRO-STAT SUGAR FREE 64)  60 mL Per Tube TID  . feeding supplement (VITAL HIGH PROTEIN)  1,000 mL Per Tube Q24H  . ipratropium  0.5 mg Nebulization Q6H  .  levalbuterol  0.63 mg Nebulization Q6H  . mouth rinse  15 mL Mouth Rinse 10 times per day  . methylPREDNISolone (SOLU-MEDROL) injection  80 mg Intravenous Q8H  . oseltamivir  75 mg Oral BID  . pantoprazole (PROTONIX) IV  40 mg Intravenous QHS  . potassium chloride  10 mEq Oral Daily  . sodium chloride flush  10-40 mL Intracatheter Q12H  . tapentadol  100 mg Oral Q12H   Continuous: . sodium chloride    . ceFEPime (MAXIPIME) IV Stopped (11/07/17 0252)  . dextrose 5 % and 0.45% NaCl 50 mL/hr at 11/07/17 0000  . heparin 1,050 Units/hr (11/07/17 0000)  . insulin (NOVOLIN-R) infusion 11.1 Units/hr (11/07/17 0604)  . propofol (DIPRIVAN) infusion 55 mcg/kg/min (11/07/17 0455)   OIZ:[id number] **OR** acetaminophen, fentaNYL (SUBLIMAZE) injection, fentaNYL (SUBLIMAZE) injection, levalbuterol, LORazepam, sodium chloride flush  Assesment: She was admitted with influenza A and developed acute hypoxic respiratory failure requiring intubation and mechanical ventilation.  She remains on the ventilator.  She has what appears to be diffuse bilateral pneumonia which is being appropriately treated.  Her oxygenation has improved.  She has COPD at baseline she is being treated with nebulizer treatments and steroids  She has diabetes and developed diabetic ketoacidosis and blood sugars are now running in the 140-150 range.  She has acute cardiomyopathy with substantial drop in her cardiac ejection fraction.  She may have Takotsubo.  She has continued to have elevated troponin.  She does not show acute ischemic changes on EKG  She continues with relatively low blood pressures but has not required pressor support.  Mixed venous O2 yesterday 69%  At baseline she has obstructive sleep apnea  She has a cardiac pacemaker which appears to be functioning normally Active Problems:   DM type 2 causing vascular disease ([place])   Current smoker   ESOPHAGEAL STRICTURE   Esophageal reflux   Gastroparesis   Cardiac  pacemaker in situ   COPD GOLD 0 / still smoking    OSA (obstructive sleep apnea)   Diabetic neuropathy (HCC)   Dysphagia   Crohn's disease without complication (HCC)   COPD exacerbation (HCC)   Influenza A   Acute respiratory failure with hypoxia (HCC)   Hyponatremia   Hypokalemia    Plan: Continue treatments.  She is clearly doing better from a respiratory standpoint but I do not think she is ready for weaning to extubation yet.  Oxygenation looks good on 50% and we can probably reduce her FiO2     LOS: 4 days   [name] L 11/07/2017, 6:44 AM

## 2017-11-07 NOTE — Progress Notes (Addendum)
Inpatient Diabetes Program Recommendations  AACE/ADA: New Consensus Statement on Inpatient Glycemic Control (2015)  Target Ranges:  Prepandial:   less than 140 mg/dL      Peak postprandial:   less than 180 mg/dL (1-2 hours)      Critically ill patients:  140 - 180 mg/dL   Lab Results  Component Value Date   GLUCAP 128 (H) 11/07/2017   HGBA1C 8.8 (H) 11/04/2017    Review of Glycemic Control Results for [name], [name] (MRN [id number]) as of 11/07/2017 10:12  Ref. Range 11/07/2017 06:01 11/07/2017 07:04 11/07/2017 08:15 11/07/2017 10:09  Glucose-Capillary Latest Ref Range: 65 - 99 mg/dL 409 (H) 811 (H) 914 (H) 128 (H)   Diabetes history: Type 2 DM Outpatient Diabetes medications: Novolog 15-21 Units TID, Invokana 100mg  QAM, Levemir 80 Units QHS, Metformin 1,000 mg BID Current orders for Inpatient glycemic control: Insulin drip per DKA order set phase 1, Solumedrol 80 mg Q8H  Inpatient Diabetes Program Recommendations:    When anion gap resolves, CO2 >20 ( per MD), and when ready totransition off of drip,  administer Levemir 28 Units 2 hours prior to the discontinuation of the drip. Progress to phase 2 of DKA order set.  Appropriate plan to continue holding Invokana, would NOT discharge with patient as part of regimen. Will plan to speak with patient to day.  Thanks, Lujean Rave, MSN, RNC-OB Diabetes Coordinator [phone] (8a-5p)

## 2017-11-07 NOTE — Progress Notes (Signed)
ANTICOAGULATION CONSULT NOTE   Pharmacy Consult for Heparin Indication: chest pain/ACS  Allergies  Allergen Reactions  . Flexeril [Cyclobenzaprine] Hives  . Amoxicillin Hives and Rash    Has patient had a PCN reaction causing immediate rash, facial/tongue/throat swelling, SOB or lightheadedness with hypotension: Yes Has patient had a PCN reaction causing severe rash involving mucus membranes or skin necrosis: Yes Has patient had a PCN reaction that required hospitalization No Has patient had a PCN reaction occurring within the last 10 years: Yes If all of the above answers are "NO", then may proceed with Cephalosporin use.    Patient Measurements: Height: 5\' 4"  (162.6 cm) Weight: 211 lb 3.2 oz (95.8 kg) IBW/kg (Calculated) : 54.7 HEPARIN DW (KG): 75.1  Vital Signs: Temp: 97.9 F (36.6 C) (02/15 0400) Temp Source: Axillary (02/15 0400) BP: 98/72 (02/15 0630) Pulse Rate: 59 (02/15 0630)  Labs: Recent Labs    11/05/17 0458  11/05/17 1439 11/05/17 1458 11/05/17 2036 11/06/17 0513 11/06/17 0909 11/07/17 0556  HGB  --   --   --   --   --  12.6  --  12.0  HCT  --   --   --   --   --  38.4  --  37.1  PLT  --   --   --   --   --  156  --  185  HEPARINUNFRC  --   --   --  0.59  --  0.36  --  0.25*  CREATININE 1.48*  --   --   --   --  1.33*  --  1.10*  TROPONINI 1.26*   < > 1.30*  --  1.68*  --  1.47*  --    < > = values in this interval not displayed.   Estimated Creatinine Clearance: 67.9 mL/min (A) (by C-G formula based on SCr of 1.1 mg/dL (H)).  Medical History: Past Medical History:  Diagnosis Date  . Anxiety   . Arthritis   . Cardiac pacemaker in situ 2009   DDD [employer] -- ALTRUNA 60  . Complication of anesthesia   . COPD with asthma (HCC)    GOLD 2-3 --  pulmologist (last visit 2011) Dr. Marchelle Gearing  . Crohn's disease (HCC)    Large intestine  . Depression 2016   PTSD  . Essential hypertension   . Family history of adverse reaction to anesthesia     father- stop breathing surgery   . Gastroparesis   . GERD (gastroesophageal reflux disease)   . History of hiatal hernia   . History of kidney stones   . History of stroke    Jun 2011 -- right hand weakness  . History of syncope   . Inappropriate sinus tachycardia    Sinus node modification 02-25-2003 by Dr. Lewayne Bunting  . LLQ abdominal tenderness 10/20/2015  . Neuromuscular disorder (HCC)    neuropathy   . OSA (obstructive sleep apnea)    Study done 2005 -- pt refused CPAP/previously was using nocturnal oxygen until one year ago pt states PCP is monitoring pt without  . Pelvic pain in female   . PONV (postoperative nausea and vomiting)   . Presence of permanent cardiac pacemaker   . PTSD (post-traumatic stress disorder)   . Sinus node dysfunction (HCC)    Symptomatic bradycardia  . Stroke [facility]) 2014   left leg weakness- uses leg brace   . Type 2 diabetes mellitus (HCC)   . Wears glasses  Medications:  Medications Prior to Admission  Medication Sig Dispense Refill Last Dose  . acetaminophen (TYLENOL) 500 MG tablet Take 500 mg by mouth every 6 (six) hours as needed for mild pain or moderate pain.   11/02/2017 at Unknown time  . aspirin EC 81 MG tablet Take 81 mg by mouth daily.   11/11/2017 at Unknown time  . carvedilol (COREG) 6.25 MG tablet Take 1 tablet (6.25 mg total) by mouth 2 (two) times daily. 180 tablet 3 11/16/2017 at Unknown time  . Cholecalciferol (VITAMIN D3) 5000 units CAPS Take 1 capsule (5,000 Units total) by mouth daily. 90 capsule 0 11/02/2017 at Unknown time  . clopidogrel (PLAVIX) 75 MG tablet Take 75 mg by mouth daily.   11/07/2017 at Unknown time  . diclofenac sodium (VOLTAREN) 1 % GEL Apply 4 g topically 4 (four) times daily. (Patient taking differently: Apply 2-4 g topically daily as needed (for leg pain). ) 200 g 11 Past Month at Unknown time  . dicyclomine (BENTYL) 20 MG tablet Take 1 tablet (20 mg total) by mouth every 8 (eight) hours as needed for spasms.  90 tablet 3 11/11/2017 at Unknown time  . diphenoxylate-atropine (LOMOTIL) 2.5-0.025 MG tablet Take 2 tabs by mouth x 1, then 1 tab every 4 hrs PRN, max of 8 tabs in 24 hour time 240 tablet 1 Past Week at Unknown time  . DULoxetine (CYMBALTA) 30 MG capsule Take 90 mg daily (60 mg + 30 mg) (Patient taking differently: Take 30 mg by mouth daily. ) 30 capsule 0 11/12/2017 at Unknown time  . DULoxetine (CYMBALTA) 60 MG capsule Take 90 mg daily (60 mg + 30 mg) 30 capsule 0 11/12/2017 at Unknown time  . furosemide (LASIX) 40 MG tablet TAKE (1) TABLET BY MOUTH EACH MORNING. 90 tablet 3 11/12/2017 at Unknown time  . ibuprofen (ADVIL,MOTRIN) 600 MG tablet Take 600 mg by mouth 2 (two) times daily as needed for moderate pain.    10/31/2017 at Unknown time  . insulin aspart (NOVOLOG FLEXPEN) 100 UNIT/ML FlexPen Inject 15-21 Units into the skin 3 (three) times daily with meals. 5 pen 2 11/07/2017 at Unknown time  . INVOKANA 100 MG TABS tablet TAKE 1 TABLET BY MOUTH DAILY BEFORE BREAKFAST. 30 tablet 2 11/15/2017 at Unknown time  . LEVEMIR FLEXTOUCH 100 UNIT/ML Pen INJECT 80 UNITS UNDER THE SKIN EVERY DAY AT 10 PM 30 mL 2 11/02/2017 at Unknown time  . lisinopril (PRINIVIL,ZESTRIL) 5 MG tablet Take 5 mg by mouth daily.   11/11/2017 at Unknown time  . LORazepam (ATIVAN) 0.5 MG tablet Take 1 tablet (0.5 mg total) by mouth daily as needed for anxiety. 30 tablet 0 Past Week at Unknown time  . LYRICA 150 MG capsule take 150mg  by mouth three times daily  0 10/27/2017 at Unknown time  . meloxicam (MOBIC) 15 MG tablet Take 15 mg by mouth daily.   11/11/2017 at Unknown time  . metFORMIN (GLUCOPHAGE) 1000 MG tablet TAKE 1 TABLET(1000 MG) BY MOUTH TWICE DAILY WITH A MEAL 60 tablet 3 10/27/2017 at Unknown time  . nitroGLYCERIN (NITROSTAT) 0.4 MG SL tablet Place 1 tablet (0.4 mg total) under the tongue every 5 (five) minutes as needed for chest pain. 30 tablet 0 unknown  . pantoprazole (PROTONIX) 40 MG tablet Take 1 tablet (40 mg total) by  mouth 2 (two) times daily. 60 tablet 0 11/02/2017 at Unknown time  . potassium chloride (K-DUR) 10 MEQ tablet Take 1 tablet (10 mEq total) by mouth  daily. 90 tablet 3 10/31/2017 at Unknown time  . pramipexole (MIRAPEX) 0.25 MG tablet Take 0.25 mg by mouth 2 (two) times daily.    11/05/2017 at Unknown time  . simvastatin (ZOCOR) 10 MG tablet TAKE (1) TABLET BY MOUTH DAILY. (Patient taking differently: TAKE (2) TABLET BY MOUTH DAILY.) 90 tablet 2 11/11/2017 at Unknown time  . tapentadol (NUCYNTA ER) 100 MG 12 hr tablet Take 100 mg by mouth every 12 (twelve) hours.   11/02/2017 at Unknown time  . tiZANidine (ZANAFLEX) 4 MG capsule Take 4 mg by mouth 3 (three) times daily.   10/31/2017 at Unknown time  . albuterol (PROVENTIL) (2.5 MG/3ML) 0.083% nebulizer solution Take 3 mLs (2.5 mg total) by nebulization every 6 (six) hours as needed for wheezing or shortness of breath. 75 mL 12 unknown  . colestipol (COLESTID) 1 g tablet Take 1 tablet (1 g total) by mouth 2 (two) times daily. (Patient not taking: Reported on 11/10/2017) 90 tablet 3 unknown  . inFLIXimab 500 mg in sodium chloride 0.9 % 200 mL Inject 500 mg into the vein every 8 (eight) weeks. 1 Dose 6 Taking  . OXYGEN Inhale 3 L/day into the lungs at bedtime.   Taking   Assessment: Asked by cardiology to initiate Heparin for ACS.  Heparin level is subtherapeutic at 0.25.  Goal of Therapy:  Heparin level 0.3-0.7 units/ml Monitor platelets by anticoagulation protocol: Yes    Plan:   Rebolus 1400 units x 1 dose  Increase heparin to 1200 units/hr  Heparin level in 6 hours and daily  CBC daily while on heparin    Judeth Cornfield, PharmD Clinical Pharmacist 11/07/2017 7:56 AM

## 2017-11-08 ENCOUNTER — Inpatient Hospital Stay (HOSPITAL_COMMUNITY): Payer: Medicaid Other

## 2017-11-08 DIAGNOSIS — R1310 Dysphagia, unspecified: Secondary | ICD-10-CM

## 2017-11-08 DIAGNOSIS — K3184 Gastroparesis: Secondary | ICD-10-CM

## 2017-11-08 LAB — CULTURE, BLOOD (ROUTINE X 2)
CULTURE: NO GROWTH
Culture: NO GROWTH
SPECIAL REQUESTS: ADEQUATE
Special Requests: ADEQUATE

## 2017-11-08 LAB — GLUCOSE, CAPILLARY
GLUCOSE-CAPILLARY: 133 mg/dL — AB (ref 65–99)
GLUCOSE-CAPILLARY: 139 mg/dL — AB (ref 65–99)
GLUCOSE-CAPILLARY: 149 mg/dL — AB (ref 65–99)
GLUCOSE-CAPILLARY: 149 mg/dL — AB (ref 65–99)
GLUCOSE-CAPILLARY: 149 mg/dL — AB (ref 65–99)
GLUCOSE-CAPILLARY: 152 mg/dL — AB (ref 65–99)
GLUCOSE-CAPILLARY: 157 mg/dL — AB (ref 65–99)
GLUCOSE-CAPILLARY: 172 mg/dL — AB (ref 65–99)
GLUCOSE-CAPILLARY: 173 mg/dL — AB (ref 65–99)
GLUCOSE-CAPILLARY: 181 mg/dL — AB (ref 65–99)
Glucose-Capillary: 149 mg/dL — ABNORMAL HIGH (ref 65–99)
Glucose-Capillary: 151 mg/dL — ABNORMAL HIGH (ref 65–99)
Glucose-Capillary: 153 mg/dL — ABNORMAL HIGH (ref 65–99)
Glucose-Capillary: 157 mg/dL — ABNORMAL HIGH (ref 65–99)
Glucose-Capillary: 164 mg/dL — ABNORMAL HIGH (ref 65–99)
Glucose-Capillary: 167 mg/dL — ABNORMAL HIGH (ref 65–99)
Glucose-Capillary: 170 mg/dL — ABNORMAL HIGH (ref 65–99)
Glucose-Capillary: 170 mg/dL — ABNORMAL HIGH (ref 65–99)
Glucose-Capillary: 175 mg/dL — ABNORMAL HIGH (ref 65–99)
Glucose-Capillary: 177 mg/dL — ABNORMAL HIGH (ref 65–99)
Glucose-Capillary: 190 mg/dL — ABNORMAL HIGH (ref 65–99)

## 2017-11-08 LAB — COOXEMETRY PANEL
CARBOXYHEMOGLOBIN: 0.9 % (ref 0.5–1.5)
Carboxyhemoglobin: 0.8 % (ref 0.5–1.5)
Carboxyhemoglobin: 0.9 % (ref 0.5–1.5)
METHEMOGLOBIN: 1.2 % (ref 0.0–1.5)
METHEMOGLOBIN: 1.2 % (ref 0.0–1.5)
Methemoglobin: 1.2 % (ref 0.0–1.5)
O2 Saturation: 84.6 %
O2 Saturation: 96.8 %
O2 Saturation: 77.2 %
TOTAL HEMOGLOBIN: 12.8 g/dL (ref 12.0–16.0)
TOTAL OXYGEN CONTENT: 15 mL/dL (ref 15.0–23.0)
TOTAL OXYGEN CONTENT: 13.9 mL/dL — AB (ref 15.0–23.0)
Total hemoglobin: 12.9 g/dL (ref 12.0–16.0)
Total hemoglobin: 13.1 g/dL (ref 12.0–16.0)
Total oxygen content: 17.2 mL/dL (ref 15.0–23.0)

## 2017-11-08 LAB — BLOOD GAS, ARTERIAL
Acid-base deficit: 7.7 mmol/L — ABNORMAL HIGH (ref 0.0–2.0)
Bicarbonate: 19 mmol/L — ABNORMAL LOW (ref 20.0–28.0)
Drawn by: 22223
FIO2: 45
LHR: 22 {breaths}/min
O2 SAT: 95.2 %
PCO2 ART: 29.6 mmHg — AB (ref 32.0–48.0)
PEEP: 5 cmH2O
VT: 500 mL
pH, Arterial: 7.367 (ref 7.350–7.450)
pO2, Arterial: 87 mmHg (ref 83.0–108.0)

## 2017-11-08 LAB — BASIC METABOLIC PANEL
ANION GAP: 9 (ref 5–15)
BUN: 57 mg/dL — ABNORMAL HIGH (ref 6–20)
CHLORIDE: 116 mmol/L — AB (ref 101–111)
CO2: 16 mmol/L — AB (ref 22–32)
Calcium: 8.5 mg/dL — ABNORMAL LOW (ref 8.9–10.3)
Creatinine, Ser: 0.95 mg/dL (ref 0.44–1.00)
GFR calc Af Amer: >60 mL/min (ref >60)
GFR calc non Af Amer: >60 mL/min (ref >60)
GLUCOSE: 181 mg/dL — AB (ref 65–99)
POTASSIUM: 3.6 mmol/L (ref 3.5–5.1)
Sodium: 141 mmol/L (ref 135–145)

## 2017-11-08 LAB — CBC
HEMATOCRIT: 38.2 % (ref 36.0–46.0)
HEMOGLOBIN: 12.6 g/dL (ref 12.0–15.0)
MCH: 31 pg (ref 26.0–34.0)
MCHC: 33 g/dL (ref 30.0–36.0)
MCV: 93.9 fL (ref 78.0–100.0)
Platelets: 201 10*3/uL (ref 150–400)
RBC: 4.07 MIL/uL (ref 3.87–5.11)
RDW: 15 % (ref 11.5–15.5)
WBC: 22.3 10*3/uL — ABNORMAL HIGH (ref 4.0–10.5)

## 2017-11-08 LAB — LACTIC ACID, PLASMA: LACTIC ACID, VENOUS: 1.7 mmol/L (ref 0.5–1.9)

## 2017-11-08 IMAGING — DX DG HIP (WITH OR WITHOUT PELVIS) 5+V BILAT
4 series · 4 of 4 positions shown · non-contrast
Comparison: Coronal and sagittal reconstructed images through the
lumbar spine and pelvis of March 30, 2015

CLINICAL DATA: Six months history of low back and bilateral hip
pain greatest on the left. No known injury

EXAM:
DG HIP (WITH OR WITHOUT PELVIS) 5+V BILAT

[pelvis ap]
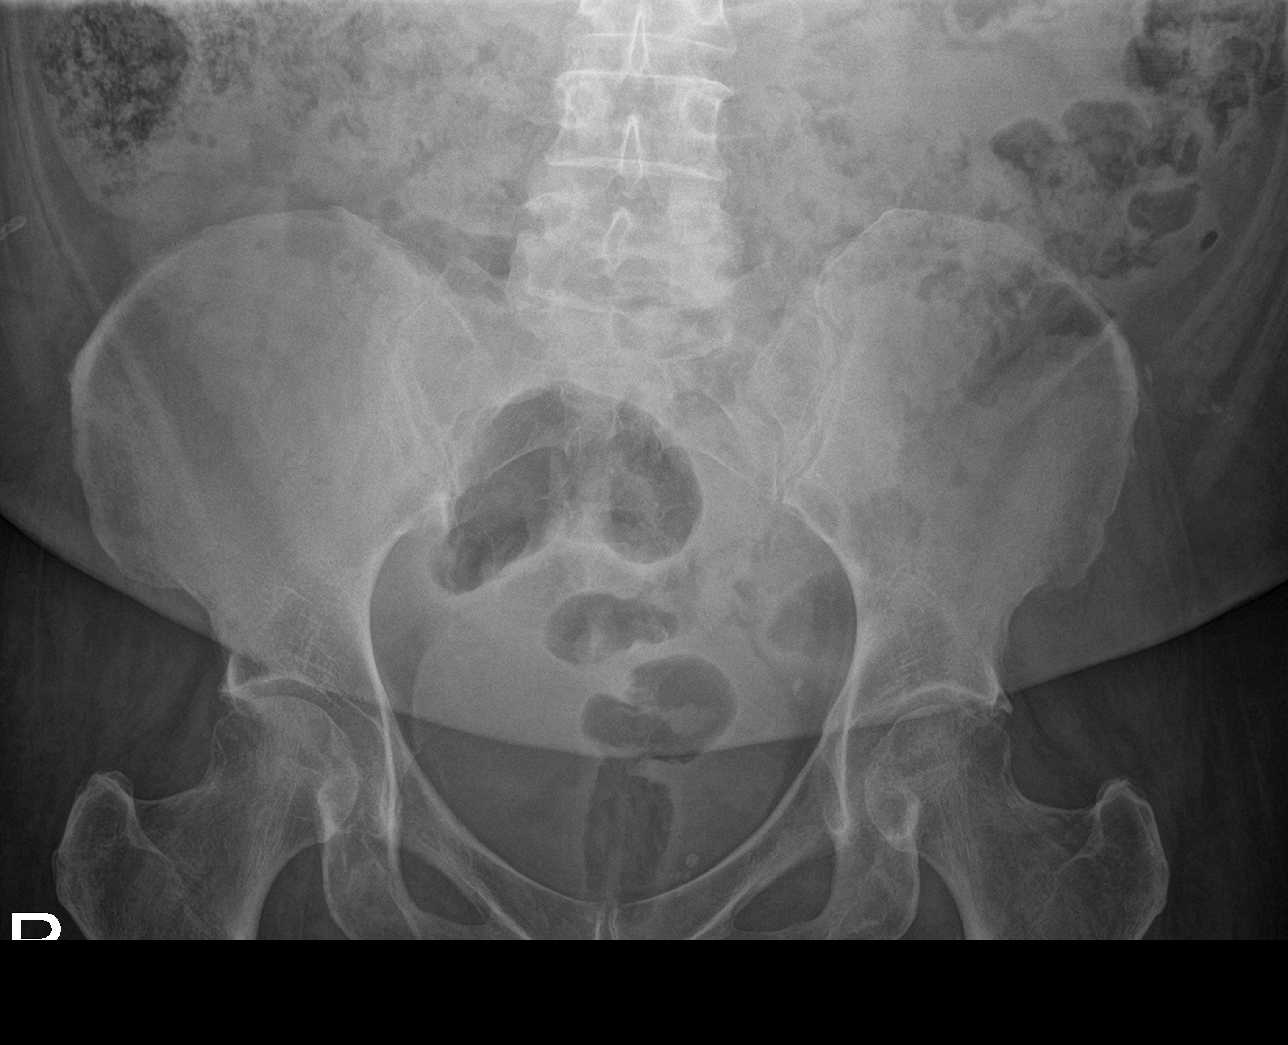

[hip lat (1 of 2)]
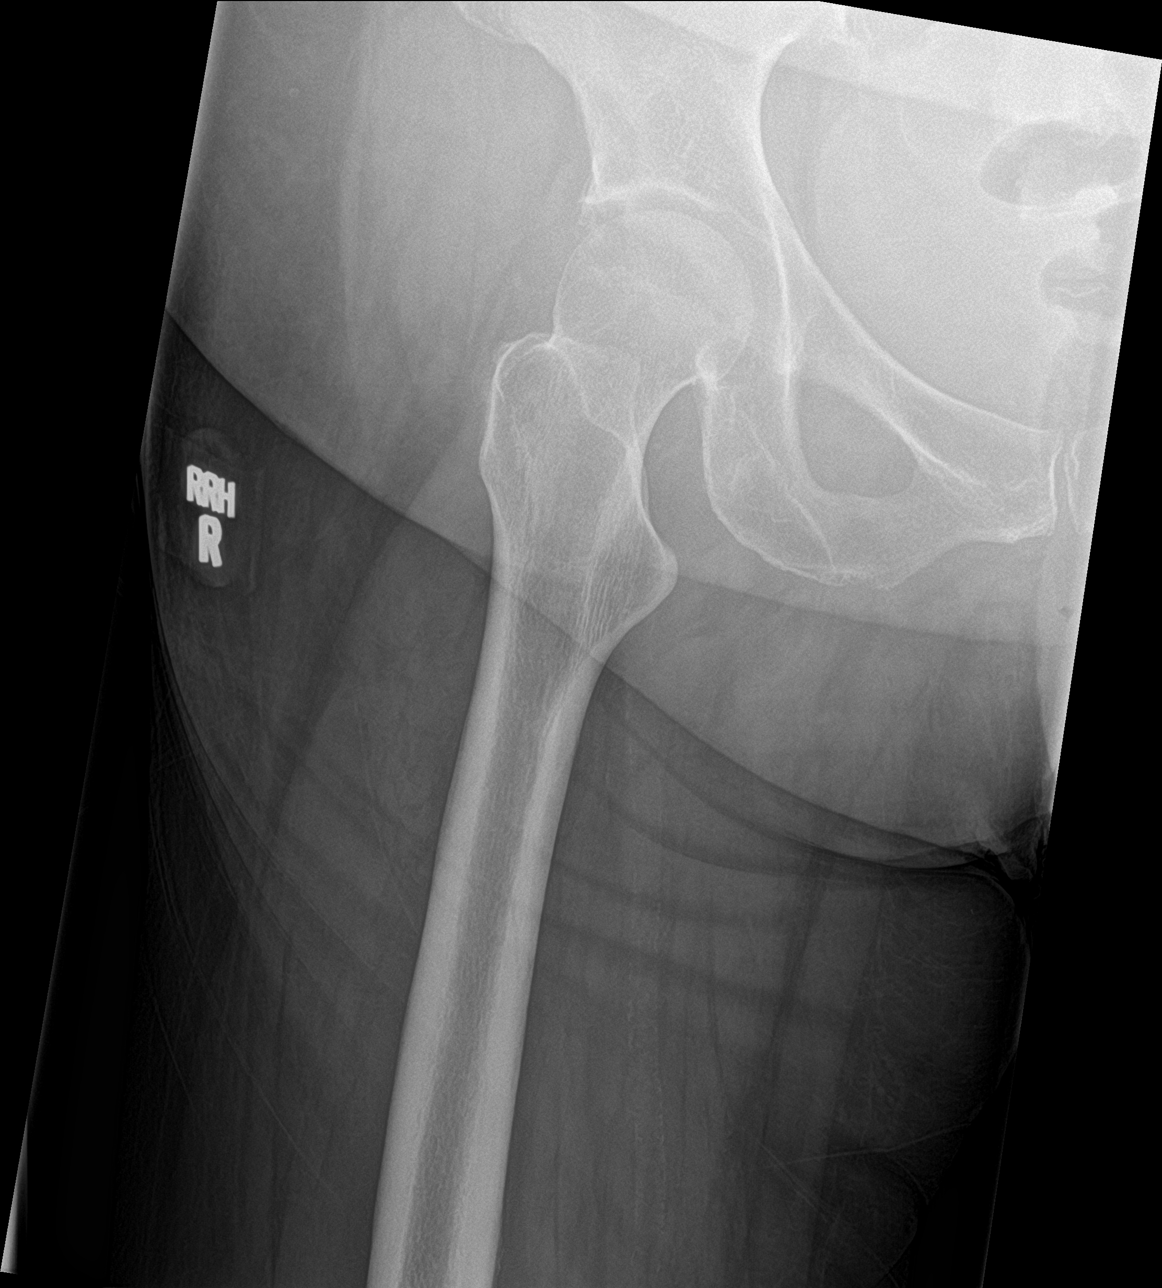

[hip ap]
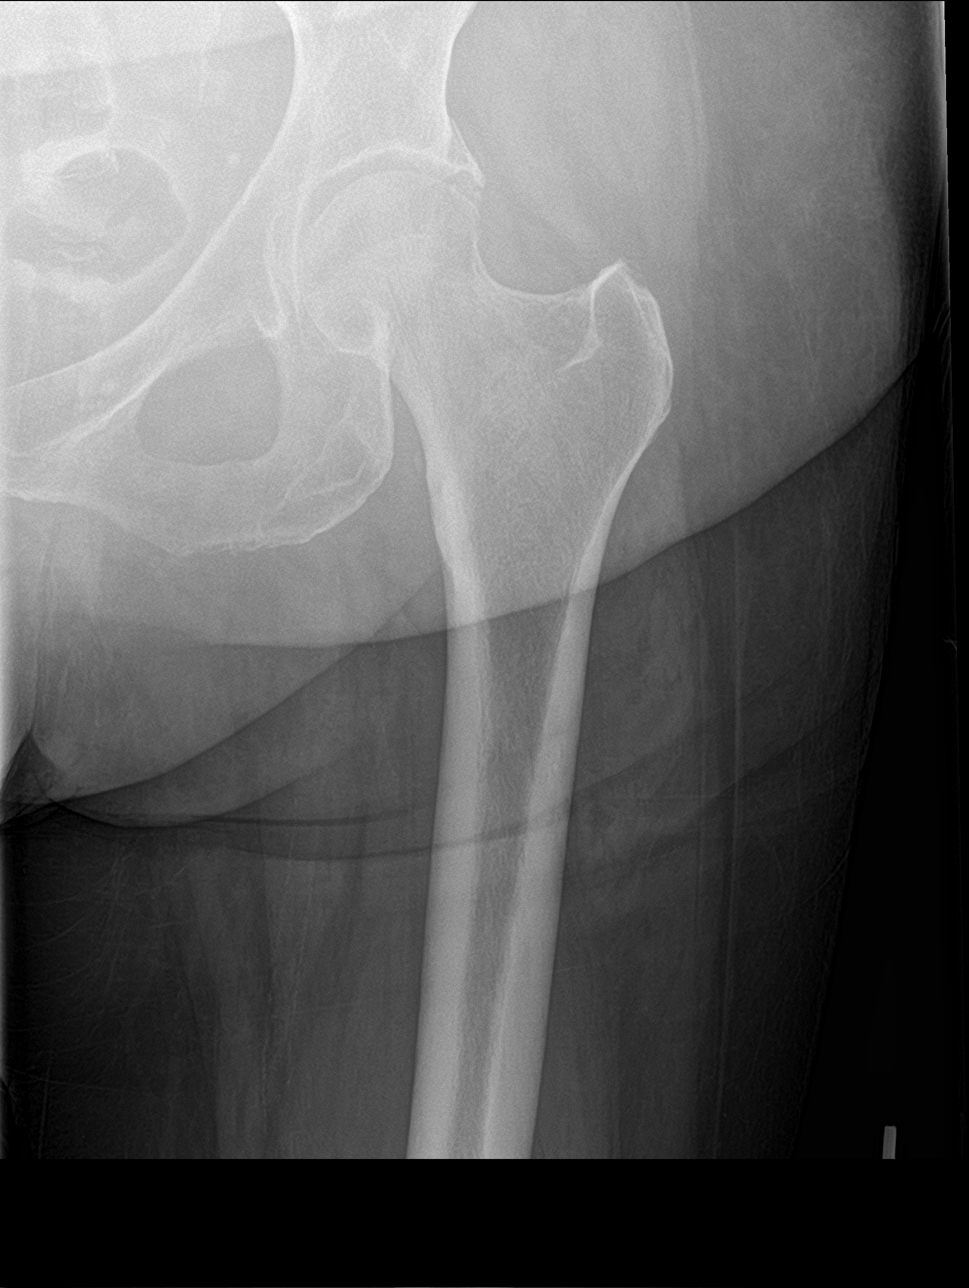

[hip lat (2 of 2)]
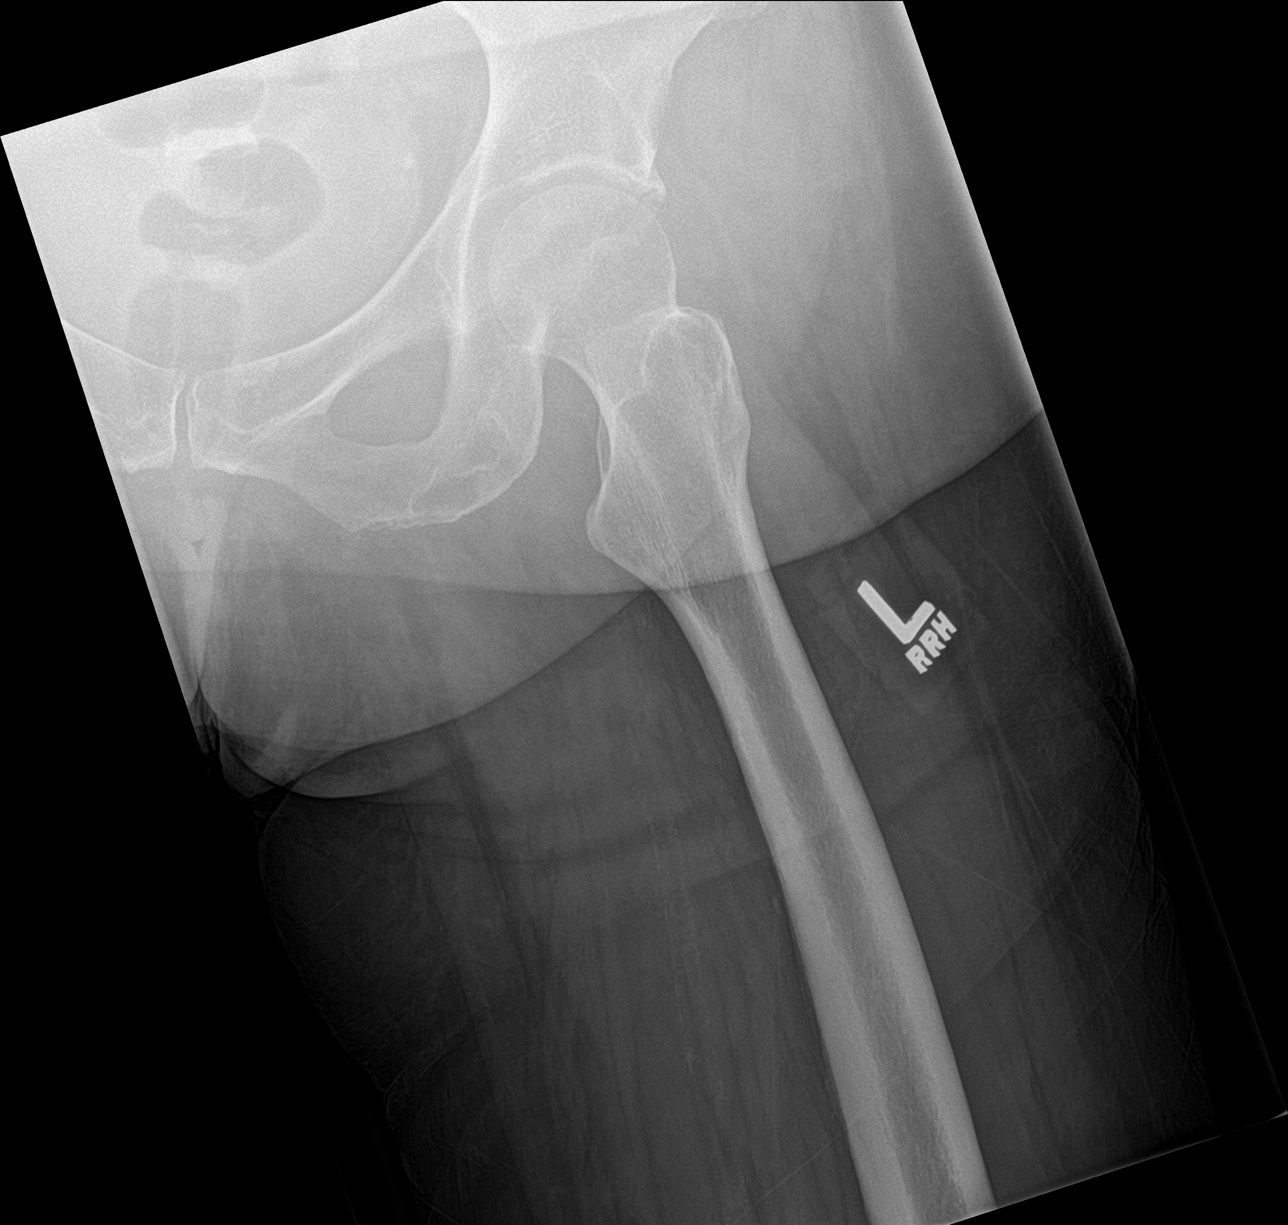

[4 of 4 positions shown; findings below may reference images not displayed]

FINDINGS: The bony pelvis is subjectively adequately mineralized. There is no
lytic or blastic lesion. No acute or old fracture is observed.

AP and lateral views of both hips reveal preservation of the joint
spaces. The articular surfaces of the femoral heads and acetabuli
remains smoothly rounded. The femoral necks, intertrochanteric, and
subtrochanteric regions are normal.
IMPRESSION: There is no acute or significant chronic bony abnormality of the
pelvis or either hip.

## 2017-11-08 IMAGING — DX DG LUMBAR SPINE COMPLETE 4+V
5 series · 5 of 5 positions shown · non-contrast
Comparison: Coronal and sagittal reconstructed images from an
abdominal and pelvic CT scan March 30, 2015

CLINICAL DATA: Back and bilateral hip pain greatest on the left.
Duration of symptoms 6 months. No known injury.

EXAM:
LUMBAR SPINE - COMPLETE 4+ VIEW

[l-spine ap]
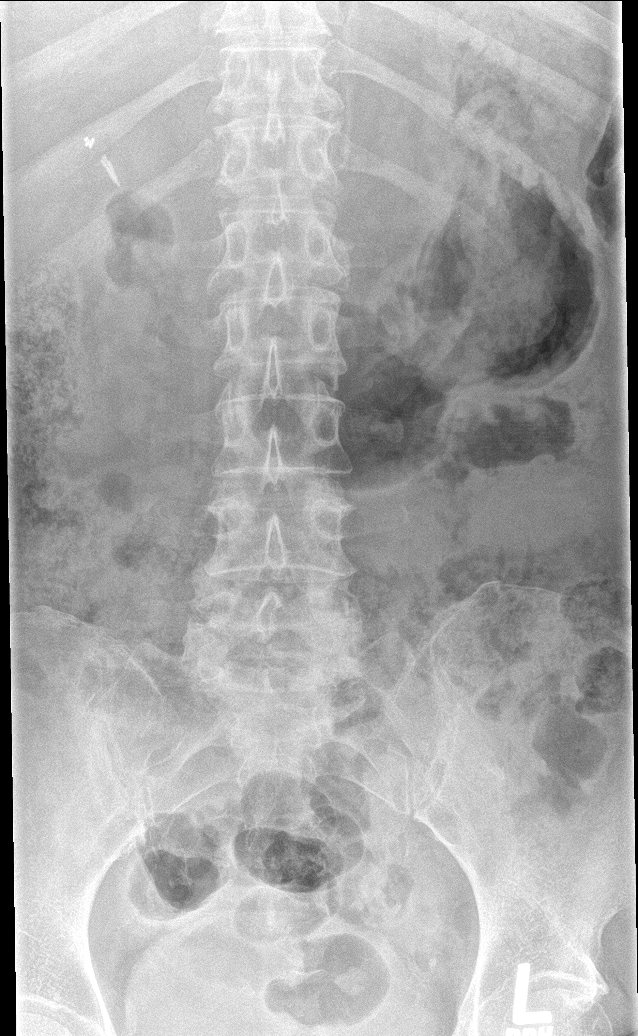

[l-spine obl (1 of 2)]
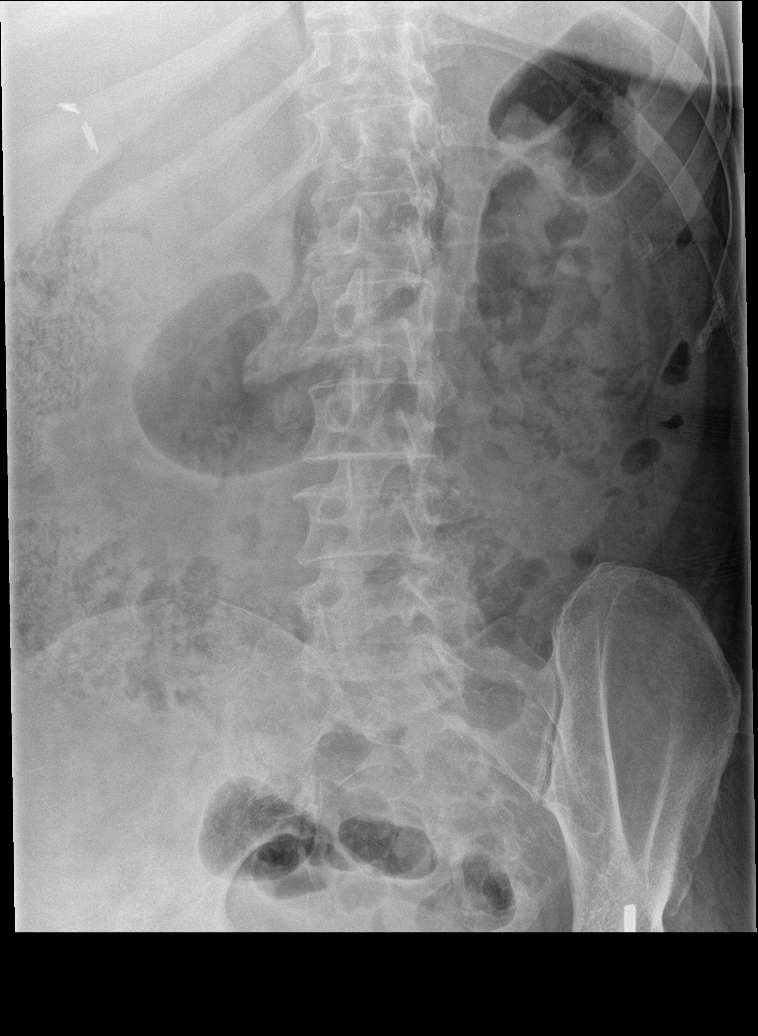

[l-spine obl (2 of 2)]
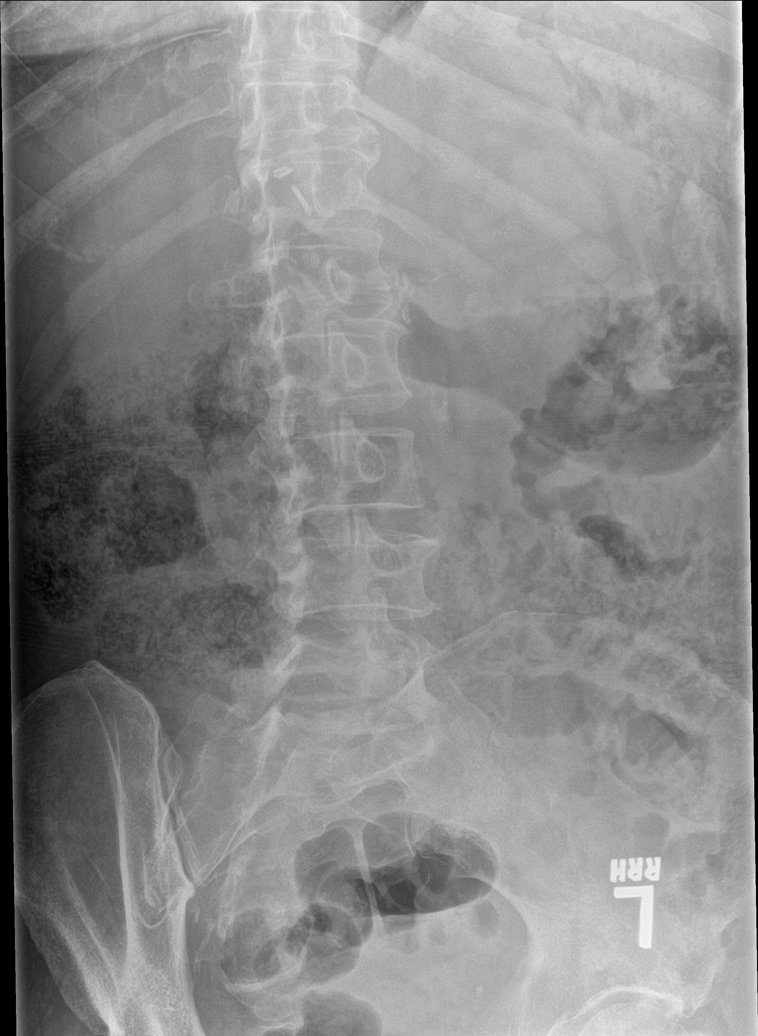

[l-spine lat]
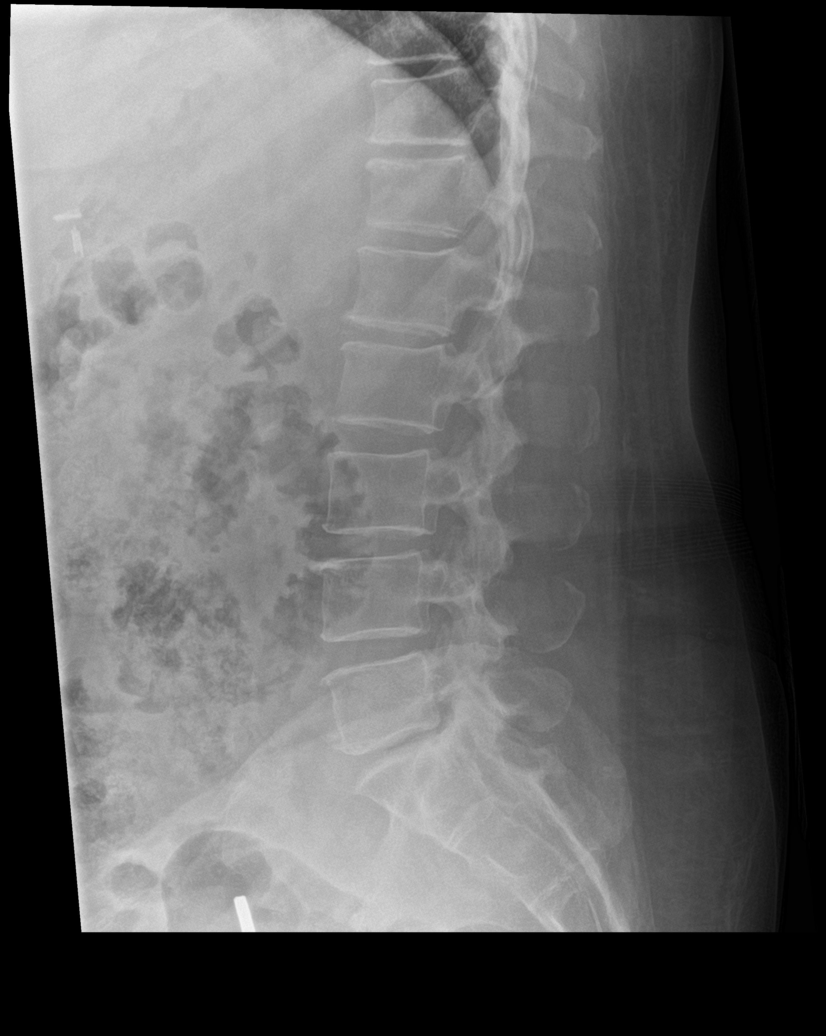

[l-spine spot]
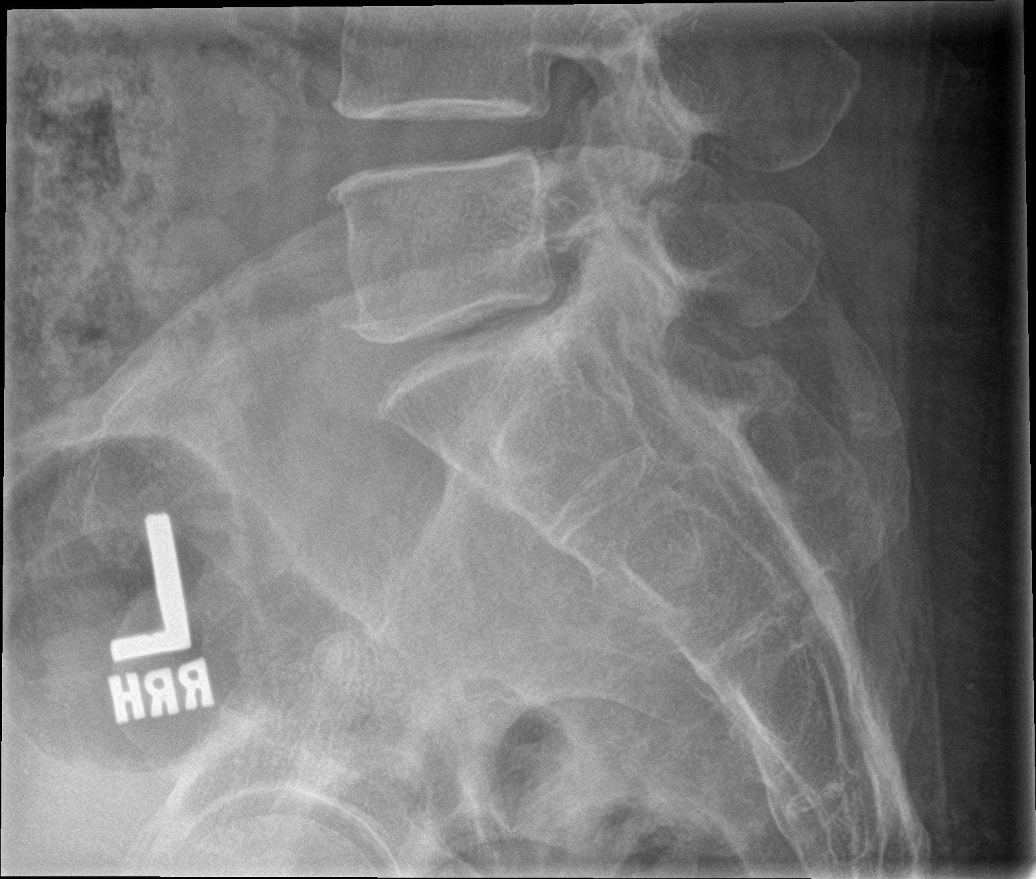

[5 of 5 positions shown; findings below may reference images not displayed]

FINDINGS: The lumbar vertebral bodies are preserved in height. The disc space
heights are reasonably well-maintained with exception of L5-S1 and
L1-L2 where there is very mild narrowing. There is no
spondylolisthesis. There is no significant facet joint hypertrophy.
The pedicles and transverse processes are intact. The bony thorax
exhibits no acute abnormality.
IMPRESSION: Very mild degenerative disc space narrowing at L1-2 and L4-5. No
significant disc space height loss elsewhere. No acute bony
abnormality. Given the patient's persistent symptoms, MRI of the
lumbar spine may be a useful next imaging step.

## 2017-11-08 MED ORDER — METHYLPREDNISOLONE SODIUM SUCC 125 MG IJ SOLR
60.0000 mg | Freq: Three times a day (TID) | INTRAMUSCULAR | Status: DC
  Administered 2017-11-08 – 2017-11-09 (×2): 60 mg via INTRAVENOUS
  Filled 2017-11-08 (×2): qty 2

## 2017-11-08 NOTE — Progress Notes (Signed)
Subjective: No new problems have been identified overnight.  She remains intubated and on the ventilator.  She is now down to 45% oxygen.  Objective: Vital signs in last 24 hours: Temp:  [98.4 F (36.9 C)-100.3 F (37.9 C)] 98.9 F (37.2 C) (02/16 0345) Pulse Rate:  [35-103] 92 (02/16 0615) Resp:  [20-36] 28 (02/16 0615) BP: (109-137)/(68-87) 123/76 (02/16 0615) SpO2:  [91 %-97 %] 95 % (02/16 0834) FiO2 (%):  [45 %] 45 % (02/16 0834) Weight:  [95.3 kg (210 lb 1.6 oz)-97.6 kg (215 lb 2.7 oz)] 97.6 kg (215 lb 2.7 oz) (02/16 0445) Weight change: -0.5 kg (-1.6 oz) Last BM Date: 11/07/17  Intake/Output from previous day: 02/15 0701 - 02/16 0700 In: 2272 [I.V.:2002; NG/GT:270] Out: 1650 [Urine:1650]  PHYSICAL EXAM General appearance: Intubated sedated on mechanical ventilation Resp: rales bilaterally and rhonchi bilaterally Cardio: Heart is regular and on the monitor it looks like she is paced GI: soft, non-tender; bowel sounds normal; no masses,  no organomegaly Extremities: extremities normal, atraumatic, no cyanosis or edema Skin warm and dry  Lab Results:  Results for orders placed or performed during the hospital encounter of 11/15/2017 (from the past 48 hour(s))  Troponin I     Status: Abnormal   Collection Time: 11/06/17  9:09 AM  Result Value Ref Range   Troponin I 1.47 (HH) <0.03 ng/mL    Comment: CRITICAL VALUE NOTED.  VALUE IS CONSISTENT WITH PREVIOUSLY REPORTED AND CALLED VALUE. Performed at [facility], [address], [place], [place] [address]   Glucose, capillary     Status: Abnormal   Collection Time: 11/06/17 10:15 AM  Result Value Ref Range   Glucose-Capillary 151 (H) 65 - 99 mg/dL  .Cooxemetry Panel (carboxy, met, total hgb, O2 sat)     Status: Abnormal   Collection Time: 11/06/17 10:20 AM  Result Value Ref Range   Total hemoglobin 20.8 (H) 12.0 - 16.0 g/dL   O2 Saturation 69.1 %   Carboxyhemoglobin 0.5 0.5 - 1.5 %   Methemoglobin 1.0 0.0 - 1.5 %   Total oxygen content 19.8 15.0 - 23.0 mL/dL    Comment: Performed at [facility], [address] [place], [place] [address]  Glucose, capillary     Status: Abnormal   Collection Time: 11/06/17 11:45 AM  Result Value Ref Range   Glucose-Capillary 178 (H) 65 - 99 mg/dL  Glucose, capillary     Status: Abnormal   Collection Time: 11/06/17 12:23 PM  Result Value Ref Range   Glucose-Capillary 180 (H) 65 - 99 mg/dL  Glucose, capillary     Status: Abnormal   Collection Time: 11/06/17  1:58 PM  Result Value Ref Range   Glucose-Capillary 184 (H) 65 - 99 mg/dL  Glucose, capillary     Status: Abnormal   Collection Time: 11/06/17  3:12 PM  Result Value Ref Range   Glucose-Capillary 171 (H) 65 - 99 mg/dL  Glucose, capillary     Status: Abnormal   Collection Time: 11/06/17  4:23 PM  Result Value Ref Range   Glucose-Capillary 170 (H) 65 - 99 mg/dL  Glucose, capillary     Status: Abnormal   Collection Time: 11/06/17  7:24 PM  Result Value Ref Range   Glucose-Capillary 146 (H) 65 - 99 mg/dL  Glucose, capillary     Status: Abnormal   Collection Time: 11/06/17  8:57 PM  Result Value Ref Range   Glucose-Capillary 171 (H) 65 - 99 mg/dL  Glucose, capillary  Status: Abnormal   Collection Time: 11/06/17 10:09 PM  Result Value Ref Range   Glucose-Capillary 165 (H) 65 - 99 mg/dL  Glucose, capillary     Status: Abnormal   Collection Time: 11/06/17 11:19 PM  Result Value Ref Range   Glucose-Capillary 205 (H) 65 - 99 mg/dL  Glucose, capillary     Status: Abnormal   Collection Time: 11/06/17 11:59 PM  Result Value Ref Range   Glucose-Capillary 226 (H) 65 - 99 mg/dL  Glucose, capillary     Status: Abnormal   Collection Time: 11/07/17  1:20 AM  Result Value Ref Range   Glucose-Capillary 186 (H) 65 - 99 mg/dL  Glucose, capillary     Status: Abnormal   Collection Time: 11/07/17  2:19 AM  Result Value Ref Range   Glucose-Capillary 163 (H) 65 - 99 mg/dL  Glucose, capillary     Status: Abnormal    Collection Time: 11/07/17  3:52 AM  Result Value Ref Range   Glucose-Capillary 135 (H) 65 - 99 mg/dL  Draw ABG 1 hour after initiation of ventilator     Status: Abnormal   Collection Time: 11/07/17  5:00 AM  Result Value Ref Range   FIO2 50.00    Delivery systems VENTILATOR    Mode VOLUME SUPPORT    VT 500 mL   LHR 22 resp/min   Peep/cpap 5.0 cm H20   pH, Arterial 7.357 7.350 - 7.450   pCO2 arterial 31.3 (L) 32.0 - 48.0 mmHg   pO2, Arterial 102.0 83.0 - 108.0 mmHg   Bicarbonate 18.8 (L) 20.0 - 28.0 mmol/L   Acid-base deficit 7.3 (H) 0.0 - 2.0 mmol/L   O2 Saturation 96.8 %   Collection site LEFT RADIAL    Drawn by [id number]    Sample type ARTERIAL    Allens test (pass/fail) PASS PASS    Comment: Performed at [facility], [address] [place], [place] [address]  Glucose, capillary     Status: Abnormal   Collection Time: 11/07/17  5:02 AM  Result Value Ref Range   Glucose-Capillary 141 (H) 65 - 99 mg/dL  Basic metabolic panel     Status: Abnormal   Collection Time: 11/07/17  5:56 AM  Result Value Ref Range   Sodium 141 135 - 145 mmol/L   Potassium 3.3 (L) 3.5 - 5.1 mmol/L   Chloride 114 (H) 101 - 111 mmol/L   CO2 17 (L) 22 - 32 mmol/L   Glucose, Bld 146 (H) 65 - 99 mg/dL   BUN 55 (H) 6 - 20 mg/dL   Creatinine, Ser 1.10 (H) 0.44 - 1.00 mg/dL   Calcium 8.7 (L) 8.9 - 10.3 mg/dL   GFR calc non Af Amer 57 (L) >60 mL/min   GFR calc Af Amer >60 >60 mL/min    Comment: (NOTE) The eGFR has been calculated using the CKD EPI equation. This calculation has not been validated in all clinical situations. eGFR's persistently <60 mL/min signify possible Chronic Kidney Disease.    Anion gap 10 5 - 15    Comment: Performed at [facility], [address] [place], [place] [address]  Heparin level (unfractionated)     Status: Abnormal   Collection Time: 11/07/17  5:56 AM  Result Value Ref Range   Heparin Unfractionated 0.25 (L) 0.30 - 0.70 IU/mL    Comment:        IF HEPARIN RESULTS ARE  BELOW EXPECTED VALUES, AND PATIENT DOSAGE HAS BEEN CONFIRMED, SUGGEST FOLLOW UP TESTING OF ANTITHROMBIN  III LEVELS. Performed at [facility], [address], [place], [place] [address]   CBC     Status: Abnormal   Collection Time: 11/07/17  5:56 AM  Result Value Ref Range   WBC 16.6 (H) 4.0 - 10.5 K/uL   RBC 3.95 3.87 - 5.11 MIL/uL   Hemoglobin 12.0 12.0 - 15.0 g/dL   HCT 37.1 36.0 - 46.0 %   MCV 93.9 78.0 - 100.0 fL   MCH 30.4 26.0 - 34.0 pg   MCHC 32.3 30.0 - 36.0 g/dL   RDW 14.8 11.5 - 15.5 %   Platelets 185 150 - 400 K/uL    Comment: Performed at [facility], [address], [place], [place] [address]  Magnesium     Status: None   Collection Time: 11/07/17  5:56 AM  Result Value Ref Range   Magnesium 2.2 1.7 - 2.4 mg/dL    Comment: Performed at [facility], [address], [place], [place] [address]  Lipid panel     Status: Abnormal   Collection Time: 11/07/17  5:56 AM  Result Value Ref Range   Cholesterol 179 0 - 200 mg/dL   Triglycerides 298 (H) <150 mg/dL   HDL 29 (L) >40 mg/dL   Total CHOL/HDL Ratio 6.2 RATIO   VLDL 60 (H) 0 - 40 mg/dL   LDL Cholesterol 90 0 - 99 mg/dL    Comment:        Total Cholesterol/HDL:CHD Risk Coronary Heart Disease Risk Table                     Men   Women  1/2 Average Risk   3.4   3.3  Average Risk       5.0   4.4  2 X Average Risk   9.6   7.1  3 X Average Risk  23.4   11.0        Use the calculated Patient Ratio above and the CHD Risk Table to determine the patient's CHD Risk.        ATP III CLASSIFICATION (LDL):  <100     mg/dL   Optimal  100-129  mg/dL   Near or Above                    Optimal  130-159  mg/dL   Borderline  160-189  mg/dL   High  >190     mg/dL   Very High Performed at [facility], [address], [place], [place] [address]   Triglycerides     Status: Abnormal   Collection Time: 11/07/17  5:56 AM  Result Value Ref Range   Triglycerides 302 (H) <150 mg/dL    Comment: Performed at [facility],  [address] [place], [place] [address]  Glucose, capillary     Status: Abnormal   Collection Time: 11/07/17  6:01 AM  Result Value Ref Range   Glucose-Capillary 147 (H) 65 - 99 mg/dL  Glucose, capillary     Status: Abnormal   Collection Time: 11/07/17  7:04 AM  Result Value Ref Range   Glucose-Capillary 181 (H) 65 - 99 mg/dL  Glucose, capillary     Status: Abnormal   Collection Time: 11/07/17  8:15 AM  Result Value Ref Range   Glucose-Capillary 155 (H) 65 - 99 mg/dL   Comment 1 Notify RN    Comment 2 Document in Chart   Glucose, capillary     Status: Abnormal   Collection Time: 11/07/17  10:09 AM  Result Value Ref Range   Glucose-Capillary 128 (H) 65 - 99 mg/dL   Comment 1 Notify RN    Comment 2 Document in Chart   Glucose, capillary     Status: Abnormal   Collection Time: 11/07/17 11:16 AM  Result Value Ref Range   Glucose-Capillary 173 (H) 65 - 99 mg/dL   Comment 1 Notify RN    Comment 2 Document in Chart   Glucose, capillary     Status: Abnormal   Collection Time: 11/07/17  1:06 PM  Result Value Ref Range   Glucose-Capillary 141 (H) 65 - 99 mg/dL  Glucose, capillary     Status: Abnormal   Collection Time: 11/07/17  3:00 PM  Result Value Ref Range   Glucose-Capillary 140 (H) 65 - 99 mg/dL  Glucose, capillary     Status: Abnormal   Collection Time: 11/07/17  3:57 PM  Result Value Ref Range   Glucose-Capillary 130 (H) 65 - 99 mg/dL   Comment 1 Notify RN    Comment 2 Document in Chart   Glucose, capillary     Status: Abnormal   Collection Time: 11/07/17  5:14 PM  Result Value Ref Range   Glucose-Capillary 168 (H) 65 - 99 mg/dL   Comment 1 Notify RN    Comment 2 Document in Chart   Glucose, capillary     Status: Abnormal   Collection Time: 11/07/17  6:19 PM  Result Value Ref Range   Glucose-Capillary 160 (H) 65 - 99 mg/dL   Comment 1 Notify RN    Comment 2 Document in Chart   Glucose, capillary     Status: Abnormal   Collection Time: 11/07/17  7:26 PM  Result Value Ref  Range   Glucose-Capillary 167 (H) 65 - 99 mg/dL   Comment 1 Notify RN    Comment 2 Document in Chart   Glucose, capillary     Status: Abnormal   Collection Time: 11/07/17  8:30 PM  Result Value Ref Range   Glucose-Capillary 145 (H) 65 - 99 mg/dL  Glucose, capillary     Status: Abnormal   Collection Time: 11/07/17  9:31 PM  Result Value Ref Range   Glucose-Capillary 150 (H) 65 - 99 mg/dL   Comment 1 Notify RN    Comment 2 Document in Chart   Glucose, capillary     Status: Abnormal   Collection Time: 11/07/17 10:34 PM  Result Value Ref Range   Glucose-Capillary 149 (H) 65 - 99 mg/dL   Comment 1 Notify RN    Comment 2 Document in Chart   Glucose, capillary     Status: Abnormal   Collection Time: 11/07/17 11:30 PM  Result Value Ref Range   Glucose-Capillary 162 (H) 65 - 99 mg/dL  Glucose, capillary     Status: Abnormal   Collection Time: 11/08/17 12:34 AM  Result Value Ref Range   Glucose-Capillary 177 (H) 65 - 99 mg/dL  Glucose, capillary     Status: Abnormal   Collection Time: 11/08/17  2:11 AM  Result Value Ref Range   Glucose-Capillary 149 (H) 65 - 99 mg/dL   Comment 1 Notify RN    Comment 2 Document in Chart   Glucose, capillary     Status: Abnormal   Collection Time: 11/08/17  3:51 AM  Result Value Ref Range   Glucose-Capillary 151 (H) 65 - 99 mg/dL   Comment 1 Notify RN    Comment 2 Document in Chart   Draw ABG  1 hour after initiation of ventilator     Status: Abnormal (Preliminary result)   Collection Time: 11/08/17  3:55 AM  Result Value Ref Range   FIO2 45.00    Delivery systems VENTILATOR    Mode ASSIST CONTROL    VT 500 mL   LHR 22 resp/min   Peep/cpap 5.0 cm H20   pH, Arterial 7.367 7.350 - 7.450   pCO2 arterial 29.6 (L) 32.0 - 48.0 mmHg   pO2, Arterial 87.0 83.0 - 108.0 mmHg   Bicarbonate 19.0 (L) 20.0 - 28.0 mmol/L   Acid-Base Excess PENDING 0.0 - 2.0 mmol/L   Acid-base deficit 7.7 (H) 0.0 - 2.0 mmol/L   O2 Saturation 95.2 %   Collection site LEFT  RADIAL    Drawn by [id number]    Sample type ARTERIAL    Allens test (pass/fail) PASS PASS    Comment: Performed at [facility], [address], [place], [place] [address]  Basic metabolic panel     Status: Abnormal   Collection Time: 11/08/17  4:33 AM  Result Value Ref Range   Sodium 141 135 - 145 mmol/L   Potassium 3.6 3.5 - 5.1 mmol/L   Chloride 116 (H) 101 - 111 mmol/L   CO2 16 (L) 22 - 32 mmol/L   Glucose, Bld 181 (H) 65 - 99 mg/dL   BUN 57 (H) 6 - 20 mg/dL   Creatinine, Ser 0.95 0.44 - 1.00 mg/dL   Calcium 8.5 (L) 8.9 - 10.3 mg/dL   GFR calc non Af Amer >60 >60 mL/min   GFR calc Af Amer >60 >60 mL/min    Comment: (NOTE) The eGFR has been calculated using the CKD EPI equation. This calculation has not been validated in all clinical situations. eGFR's persistently <60 mL/min signify possible Chronic Kidney Disease.    Anion gap 9 5 - 15    Comment: Performed at [facility], [address], [place], [place] [address]  CBC     Status: Abnormal   Collection Time: 11/08/17  4:33 AM  Result Value Ref Range   WBC 22.3 (H) 4.0 - 10.5 K/uL   RBC 4.07 3.87 - 5.11 MIL/uL   Hemoglobin 12.6 12.0 - 15.0 g/dL   HCT 38.2 36.0 - 46.0 %   MCV 93.9 78.0 - 100.0 fL   MCH 31.0 26.0 - 34.0 pg   MCHC 33.0 30.0 - 36.0 g/dL   RDW 15.0 11.5 - 15.5 %   Platelets 201 150 - 400 K/uL    Comment: Performed at [facility], [address], [place], [place] [address]  Glucose, capillary     Status: Abnormal   Collection Time: 11/08/17  4:51 AM  Result Value Ref Range   Glucose-Capillary 167 (H) 65 - 99 mg/dL  Glucose, capillary     Status: Abnormal   Collection Time: 11/08/17  6:16 AM  Result Value Ref Range   Glucose-Capillary 170 (H) 65 - 99 mg/dL  Glucose, capillary     Status: Abnormal   Collection Time: 11/08/17  7:35 AM  Result Value Ref Range   Glucose-Capillary 172 (H) 65 - 99 mg/dL  Glucose, capillary     Status: Abnormal   Collection Time: 11/08/17  8:41 AM  Result Value Ref Range    Glucose-Capillary 164 (H) 65 - 99 mg/dL    ABGS Recent Labs    11/08/17 0355  PHART 7.367  PO2ART 87.0  HCO3 19.0*   CULTURES Recent Results (from the past 240 hour(s))  Blood  Culture (routine x 2)     Status: None   Collection Time: 11/08/2017  6:27 PM  Result Value Ref Range Status   Specimen Description RIGHT ANTECUBITAL  Final   Special Requests   Final    BOTTLES DRAWN AEROBIC AND ANAEROBIC Blood Culture adequate volume   Culture   Final    NO GROWTH 5 DAYS Performed at [facility], [address], [place], [place] [address]    Report Status 11/08/2017 FINAL  Final  Blood Culture (routine x 2)     Status: None   Collection Time: 11/17/2017  6:27 PM  Result Value Ref Range Status   Specimen Description RIGHT ANTECUBITAL  Final   Special Requests   Final    BOTTLES DRAWN AEROBIC AND ANAEROBIC Blood Culture adequate volume   Culture   Final    NO GROWTH 5 DAYS Performed at [facility], [address], [place], [place] [address]    Report Status 11/08/2017 FINAL  Final  MRSA PCR Screening     Status: None   Collection Time: 11/04/17  1:26 AM  Result Value Ref Range Status   MRSA by PCR NEGATIVE NEGATIVE Final    Comment:        The GeneXpert MRSA Assay (FDA approved for NASAL specimens only), is one component of a comprehensive MRSA colonization surveillance program. It is not intended to diagnose MRSA infection nor to guide or monitor treatment for MRSA infections. Performed at [facility], [address] [place], [place] [address]    Studies/Results: Dg Chest Port 1 View  Result Date: 11/08/2017 CLINICAL DATA:  Respiratory failure EXAM: PORTABLE CHEST 1 VIEW COMPARISON:  11/07/2017 FINDINGS: Endotracheal tube in good position. NG remains in place. Dual lead pacemaker remains in place Bilateral airspace disease unchanged.  No effusion. Right arm PICC tip in the mid SVC. IMPRESSION: Endotracheal tube remains in good position. Bilateral airspace disease unchanged.  Possible edema versus pneumonia. Electronically Signed   By: Franchot Gallo M.D.   On: 11/08/2017 08:23   Dg Chest Port 1 View  Result Date: 11/07/2017 CLINICAL DATA:  Respiratory failure. EXAM: PORTABLE CHEST 1 VIEW COMPARISON:  11/06/2017. 11/04/2017. 09/21/2017. CT chest 09/21/2017. FINDINGS: Endotracheal tube and NG tube in stable position. Cardiac pacer stable position. Heart size normal. Diffuse bilateral pulmonary infiltrates/edema, left side greater than right again noted. Similar findings on prior exam. Low lung volumes with basilar atelectasis. No prominent pleural effusion. No pneumothorax. IMPRESSION: 1.  Endotracheal tube and NG tube in stable position. 2.  Cardiac pacer stable position.  Heart size stable. 3. Persistent diffuse bilateral pulmonary infiltrates/edema, left side greater than right again noted. No significant change from prior exam. Low lung volumes with bibasilar atelectasis. Electronically Signed   By: Marcello Moores  Register   On: 11/07/2017 07:11    Medications:  Prior to Admission:  Medications Prior to Admission  Medication Sig Dispense Refill Last Dose  . acetaminophen (TYLENOL) 500 MG tablet Take 500 mg by mouth every 6 (six) hours as needed for mild pain or moderate pain.   11/02/2017 at Unknown time  . aspirin EC 81 MG tablet Take 81 mg by mouth daily.   11/18/2017 at Unknown time  . carvedilol (COREG) 6.25 MG tablet Take 1 tablet (6.25 mg total) by mouth 2 (two) times daily. 180 tablet 3 10/28/2017 at Unknown time  . Cholecalciferol (VITAMIN D3) 5000 units CAPS Take 1 capsule (5,000 Units total) by mouth daily. 90 capsule 0 11/12/2017 at Unknown time  .  clopidogrel (PLAVIX) 75 MG tablet Take 75 mg by mouth daily.   11/13/2017 at Unknown time  . diclofenac sodium (VOLTAREN) 1 % GEL Apply 4 g topically 4 (four) times daily. (Patient taking differently: Apply 2-4 g topically daily as needed (for leg pain). ) 200 g 11 Past Month at Unknown time  . dicyclomine (BENTYL) 20 MG  tablet Take 1 tablet (20 mg total) by mouth every 8 (eight) hours as needed for spasms. 90 tablet 3 11/02/2017 at Unknown time  . diphenoxylate-atropine (LOMOTIL) 2.5-0.025 MG tablet Take 2 tabs by mouth x 1, then 1 tab every 4 hrs PRN, max of 8 tabs in 24 hour time 240 tablet 1 Past Week at Unknown time  . DULoxetine (CYMBALTA) 30 MG capsule Take 90 mg daily (60 mg + 30 mg) (Patient taking differently: Take 30 mg by mouth daily. ) 30 capsule 0 10/28/2017 at Unknown time  . DULoxetine (CYMBALTA) 60 MG capsule Take 90 mg daily (60 mg + 30 mg) 30 capsule 0 11/02/2017 at Unknown time  . furosemide (LASIX) 40 MG tablet TAKE (1) TABLET BY MOUTH EACH MORNING. 90 tablet 3 11/11/2017 at Unknown time  . ibuprofen (ADVIL,MOTRIN) 600 MG tablet Take 600 mg by mouth 2 (two) times daily as needed for moderate pain.    11/11/2017 at Unknown time  . insulin aspart (NOVOLOG FLEXPEN) 100 UNIT/ML FlexPen Inject 15-21 Units into the skin 3 (three) times daily with meals. 5 pen 2 10/30/2017 at Unknown time  . INVOKANA 100 MG TABS tablet TAKE 1 TABLET BY MOUTH DAILY BEFORE BREAKFAST. 30 tablet 2 11/10/2017 at Unknown time  . LEVEMIR FLEXTOUCH 100 UNIT/ML Pen INJECT 80 UNITS UNDER THE SKIN EVERY DAY AT 10 PM 30 mL 2 11/02/2017 at Unknown time  . lisinopril (PRINIVIL,ZESTRIL) 5 MG tablet Take 5 mg by mouth daily.   10/28/2017 at Unknown time  . LORazepam (ATIVAN) 0.5 MG tablet Take 1 tablet (0.5 mg total) by mouth daily as needed for anxiety. 30 tablet 0 Past Week at Unknown time  . LYRICA 150 MG capsule take 113m by mouth three times daily  0 11/15/2017 at Unknown time  . meloxicam (MOBIC) 15 MG tablet Take 15 mg by mouth daily.   11/10/2017 at Unknown time  . metFORMIN (GLUCOPHAGE) 1000 MG tablet TAKE 1 TABLET(1000 MG) BY MOUTH TWICE DAILY WITH A MEAL 60 tablet 3 11/01/2017 at Unknown time  . nitroGLYCERIN (NITROSTAT) 0.4 MG SL tablet Place 1 tablet (0.4 mg total) under the tongue every 5 (five) minutes as needed for chest pain. 30  tablet 0 unknown  . pantoprazole (PROTONIX) 40 MG tablet Take 1 tablet (40 mg total) by mouth 2 (two) times daily. 60 tablet 0 11/18/2017 at Unknown time  . potassium chloride (K-DUR) 10 MEQ tablet Take 1 tablet (10 mEq total) by mouth daily. 90 tablet 3 11/04/2017 at Unknown time  . pramipexole (MIRAPEX) 0.25 MG tablet Take 0.25 mg by mouth 2 (two) times daily.    11/10/2017 at Unknown time  . simvastatin (ZOCOR) 10 MG tablet TAKE (1) TABLET BY MOUTH DAILY. (Patient taking differently: TAKE (2) TABLET BY MOUTH DAILY.) 90 tablet 2 10/31/2017 at Unknown time  . tapentadol (NUCYNTA ER) 100 MG 12 hr tablet Take 100 mg by mouth every 12 (twelve) hours.   11/02/2017 at Unknown time  . tiZANidine (ZANAFLEX) 4 MG capsule Take 4 mg by mouth 3 (three) times daily.   11/07/2017 at Unknown time  . albuterol (PROVENTIL) (2.5 MG/3ML) 0.083%  nebulizer solution Take 3 mLs (2.5 mg total) by nebulization every 6 (six) hours as needed for wheezing or shortness of breath. 75 mL 12 unknown  . colestipol (COLESTID) 1 g tablet Take 1 tablet (1 g total) by mouth 2 (two) times daily. (Patient not taking: Reported on 10/26/2017) 90 tablet 3 unknown  . inFLIXimab 500 mg in sodium chloride 0.9 % 200 mL Inject 500 mg into the vein every 8 (eight) weeks. 1 Dose 6 Taking  . OXYGEN Inhale 3 L/day into the lungs at bedtime.   Taking   Scheduled: . aspirin EC  81 mg Oral Daily  . atorvastatin  80 mg Oral q1800  . budesonide (PULMICORT) nebulizer solution  0.5 mg Nebulization BID  . chlorhexidine gluconate (MEDLINE KIT)  15 mL Mouth Rinse BID  . Chlorhexidine Gluconate Cloth  6 each Topical Daily  . clopidogrel  75 mg Oral Daily  . feeding supplement (PRO-STAT SUGAR FREE 64)  60 mL Per Tube TID  . feeding supplement (VITAL HIGH PROTEIN)  1,000 mL Per Tube Q24H  . heparin injection (subcutaneous)  5,000 Units Subcutaneous Q8H  . ipratropium  0.5 mg Nebulization Q6H  . levalbuterol  0.63 mg Nebulization Q6H  . mouth rinse  15 mL Mouth  Rinse 10 times per day  . methylPREDNISolone (SOLU-MEDROL) injection  80 mg Intravenous Q8H  . oseltamivir  75 mg Oral BID  . pantoprazole (PROTONIX) IV  40 mg Intravenous QHS  . potassium chloride  10 mEq Oral Daily  . sodium chloride flush  10-40 mL Intracatheter Q12H  . tapentadol  100 mg Oral Q12H   Continuous: . sodium chloride    . ceFEPime (MAXIPIME) IV Stopped (11/08/17 0200)  . dextrose 5 % and 0.45% NaCl 50 mL/hr at 11/07/17 2054  . insulin (NOVOLIN-R) infusion 9.4 Units/hr (11/08/17 0852)  . propofol (DIPRIVAN) infusion 35 mcg/kg/min (11/08/17 0859)   JGG:[id number] **OR** acetaminophen, fentaNYL (SUBLIMAZE) injection, fentaNYL (SUBLIMAZE) injection, levalbuterol, LORazepam, sodium chloride flush  Assesment: She has been admitted with acute hypoxic respiratory failure from influenza a COPD exacerbation probable bilateral diffuse pneumonia and acute systolic heart failure.  Her chest x-ray still shows diffuse bilateral infiltrates but she has been able to come down to 45% oxygen.  She is being treated for pneumonia and influenza.  She has soft blood pressures which makes treating her heart failure more difficult.  Diuretics have been held because of acute kidney injury.  Agree she probably has had sepsis with vasodilation  She has had elevated troponin likely secondary to demand ischemia but she may need cardiac catheterization after she recovers  She has sleep apnea at baseline which may complicate her coming off of the ventilator  She has acute kidney injury which is improving Active Problems:   DM type 2 causing vascular disease ([place])   Current smoker   ESOPHAGEAL STRICTURE   Esophageal reflux   Gastroparesis   Cardiac pacemaker in situ   COPD GOLD 0 / still smoking    OSA (obstructive sleep apnea)   Diabetic neuropathy (HCC)   Dysphagia   Crohn's disease without complication (HCC)   COPD exacerbation (HCC)   Influenza A   Acute respiratory failure with  hypoxia (HCC)   Hyponatremia   Hypokalemia    Plan: Continue antibiotics.  Check CVP.  Check venous O2 saturation try some weaning today but I do not think she will be ready to come off the ventilator    LOS: 5 days   [name] L  11/08/2017, 9:04 AM

## 2017-11-08 NOTE — Progress Notes (Signed)
**Note De-Identified  Obfuscation** CO-OX results:  COHb  0.9  SO2  96.8  O2Hb  94.8  PO2  98.7  MetHb  1.2

## 2017-11-08 NOTE — Progress Notes (Signed)
Wake up assessment attempted. Rate of sedative decreased by half (propofol 55 mcg to 25 mcg/min). Patient became agitated. Will reassess as needed.

## 2017-11-08 NOTE — Progress Notes (Signed)
[facility] PROGRESS NOTE  [name] VQQ:[id number] DOB: 10/09/1965 DOA: 11/08/2017 PCP: [facility] [place]  Interim summary and HPI 52 y.o. female,  w Copd on home O2, apparently ran out of her breathing treatments and c/o increase in dyspnea. Pt states that her breathing started to become worse on Friday.  + cough w yellow sputum.  Denies fever, chills, cp, palpitations, brbpr.   Pt notes n/v, general malaise and diarrhea over the past 2 days.   In ED, pox on RA was in the mid to high 80's; found to have positive influenza A. CXR also demonstrated coarse interstitial marking and vascular congestion; unable to r/o underlying infiltrates. WBC's 12.6, fever 102.6, RR 25  Of, note patient was also found to be on DKA and 2-D Echo demonstrated EF of 30% with concerns for Takotsubo, cardiology following.   Assessment/Plan: 1-acute resp failure with hypoxia: in the setting of COPD exacerbation and influenza A and pneumonia -continue ventilatory support -appreciate assistance by pulmonary/critical care service -continue steroids, Duoneb and antibiotics -continue pulmicort BID and atrovent QID -pulmonary will try weaning vent today -continue current antibiotics for pneumonia coverage.  2-DKA: -in the setting of steroid use and infection and possibly invokana (currently being held) -Continue DKA protocol and IV insulin for now as patient remains NPO, the safest option is to continue the IV insulin infusion as she is on continuous tube feeding and sedated on the vent.    -follow CBGs -follow BMP  3-acute Cardiomyopathy: concern for takotsubo; positive apical wall motion abnormalities and decrease EF; no prior hx of CAD or heart failure.  -cardiology consulted and working patient up -PICC line to check CVP and COX -diuretics per cardiology service, have been held last couple of days, likely to resume soon -given low BP now, will avoid the use of B-blocker,  ARB/ACEI -daily weights and strict intake and output  Filed Weights   11/07/17 0500 11/08/17 0345 11/08/17 0445  Weight: 95.8 kg (211 lb 3.2 oz) 95.3 kg (210 lb 1.6 oz) 97.6 kg (215 lb 2.7 oz)   4-influenza A -will continue providing supportive care and tamiflu  5-GERD/GI prophylaxis -continue PPI  6-Sepsis: clinically improving  -code sepsis initiated -continue cefepime  7. Elevated troponin / ACS - cardiology starting patient on IV heparin infusion.  Code Status: Full Family Communication: no family at bedside Disposition Plan: remains in ICU, continue ventilatory support, IV broad spectrum antibiotics; will follow cardiology recs and continue treatment for DKA per protocol.  Consultants:  Cardiology  Pulmonary/critical care  Procedures:  See below for x-ray reports   2-D echo: EF 30%, apical WMA  Antibiotics:  Cefepime, vancomycin and levaquin 2/12  HPI/Subjective: Currently afebrile and sedated on vent.    Objective: Vitals:   11/08/17 0615 11/08/17 0834  BP: 123/76   Pulse: 92   Resp: (!) 28   Temp:    SpO2: 94% 95%    Intake/Output Summary (Last 24 hours) at 11/08/2017 1102 Last data filed at 11/08/2017 0300 Gross per 24 hour  Intake 2242 ml  Output 1650 ml  Net 592 ml   Filed Weights   11/07/17 0500 11/08/17 0345 11/08/17 0445  Weight: 95.8 kg (211 lb 3.2 oz) 95.3 kg (210 lb 1.6 oz) 97.6 kg (215 lb 2.7 oz)    Exam:   General:  Afebrile, sedated and ventilated.  Cardiovascular: normal S1 and S2, no rubs or gallops.   Respiratory: expiratory wheezes heard bilateral better air movement noted. Crackles heard bases.  Abdomen: obese, soft, NT, ND, positive BS.  Musculoskeletal: SCDs bilateral LEs, no cyanosis, no clubbing   Data Reviewed: Basic Metabolic Panel: Recent Labs  Lab 11/04/17 2358 11/05/17 0458 11/06/17 0513 11/06/17 0610 11/07/17 0556 11/08/17 0433  NA 136 142 140  --  141 141  K 3.5 3.4* 3.2*  --  3.3* 3.6  CL 106  109 110  --  114* 116*  CO2 19* 19* 18*  --  17* 16*  GLUCOSE 168* 158* 174*  --  146* 181*  BUN 44* 48* 51*  --  55* 57*  CREATININE 1.44* 1.48* 1.33*  --  1.10* 0.95  CALCIUM 8.5* 8.6* 8.7*  --  8.7* 8.5*  MG  --   --   --  1.9 2.2  --    Liver Function Tests: Recent Labs  Lab 11/12/2017 1752 11/04/17 0433  AST 31 42*  ALT 27 25  ALKPHOS 68 67  BILITOT 0.9 1.2  PROT 7.4 7.3  ALBUMIN 3.1* 3.0*   Recent Labs  Lab 11/02/2017 1806  LIPASE 19   CBC: Recent Labs  Lab 11/02/2017 1752 11/04/17 0433 11/06/17 0513 11/07/17 0556 11/08/17 0433  WBC 12.6* 11.4* 12.9* 16.6* 22.3*  NEUTROABS 9.4*  --   --   --   --   HGB 16.1* 15.6* 12.6 12.0 12.6  HCT 48.1* 47.8* 38.4 37.1 38.2  MCV 92.9 95.0 92.5 93.9 93.9  PLT 123* 118* 156 185 201   Cardiac Enzymes: Recent Labs  Lab 11/05/17 0458 11/05/17 0909 11/05/17 1439 11/05/17 2036 11/06/17 0909  TROPONINI 1.26* 1.18* 1.30* 1.68* 1.47*   BNP (last 3 results) Recent Labs    09/20/17 2346 11/15/2017 1753 11/04/17 1619  BNP 227.0* 36.0 368.0*    CBG: Recent Labs  Lab 11/08/17 0616 11/08/17 0735 11/08/17 0841 11/08/17 0940 11/08/17 1044  GLUCAP 170* 172* 164* 157* 152*    Recent Results (from the past 240 hour(s))  Blood Culture (routine x 2)     Status: None   Collection Time: 11/12/2017  6:27 PM  Result Value Ref Range Status   Specimen Description RIGHT ANTECUBITAL  Final   Special Requests   Final    BOTTLES DRAWN AEROBIC AND ANAEROBIC Blood Culture adequate volume   Culture   Final    NO GROWTH 5 DAYS Performed at [facility], [address] [place], [place] [address]    Report Status 11/08/2017 FINAL  Final  Blood Culture (routine x 2)     Status: None   Collection Time: 10/26/2017  6:27 PM  Result Value Ref Range Status   Specimen Description RIGHT ANTECUBITAL  Final   Special Requests   Final    BOTTLES DRAWN AEROBIC AND ANAEROBIC Blood Culture adequate volume   Culture   Final    NO GROWTH 5  DAYS Performed at [facility], [address] [place], [place] [address]    Report Status 11/08/2017 FINAL  Final  MRSA PCR Screening     Status: None   Collection Time: 11/04/17  1:26 AM  Result Value Ref Range Status   MRSA by PCR NEGATIVE NEGATIVE Final    Comment:        The GeneXpert MRSA Assay (FDA approved for NASAL specimens only), is one component of a comprehensive MRSA colonization surveillance program. It is not intended to diagnose MRSA infection nor to guide or monitor treatment for MRSA infections. Performed at [facility], [address], [place], [place] [address]  Studies: Dg Chest Port 1 View  Result Date: 11/08/2017 CLINICAL DATA:  Respiratory failure EXAM: PORTABLE CHEST 1 VIEW COMPARISON:  11/07/2017 FINDINGS: Endotracheal tube in good position. NG remains in place. Dual lead pacemaker remains in place Bilateral airspace disease unchanged.  No effusion. Right arm PICC tip in the mid SVC. IMPRESSION: Endotracheal tube remains in good position. Bilateral airspace disease unchanged. Possible edema versus pneumonia. Electronically Signed   By: Franchot Gallo M.D.   On: 11/08/2017 08:23   Dg Chest Port 1 View  Result Date: 11/07/2017 CLINICAL DATA:  Respiratory failure. EXAM: PORTABLE CHEST 1 VIEW COMPARISON:  11/06/2017. 10/30/2017. 09/21/2017. CT chest 09/21/2017. FINDINGS: Endotracheal tube and NG tube in stable position. Cardiac pacer stable position. Heart size normal. Diffuse bilateral pulmonary infiltrates/edema, left side greater than right again noted. Similar findings on prior exam. Low lung volumes with basilar atelectasis. No prominent pleural effusion. No pneumothorax. IMPRESSION: 1.  Endotracheal tube and NG tube in stable position. 2.  Cardiac pacer stable position.  Heart size stable. 3. Persistent diffuse bilateral pulmonary infiltrates/edema, left side greater than right again noted. No significant change from prior exam. Low lung volumes with  bibasilar atelectasis. Electronically Signed   By: Marcello Moores  Register   On: 11/07/2017 07:11    Scheduled Meds: . aspirin EC  81 mg Oral Daily  . atorvastatin  80 mg Oral q1800  . budesonide (PULMICORT) nebulizer solution  0.5 mg Nebulization BID  . chlorhexidine gluconate (MEDLINE KIT)  15 mL Mouth Rinse BID  . Chlorhexidine Gluconate Cloth  6 each Topical Daily  . clopidogrel  75 mg Oral Daily  . feeding supplement (PRO-STAT SUGAR FREE 64)  60 mL Per Tube TID  . feeding supplement (VITAL HIGH PROTEIN)  1,000 mL Per Tube Q24H  . heparin injection (subcutaneous)  5,000 Units Subcutaneous Q8H  . ipratropium  0.5 mg Nebulization Q6H  . levalbuterol  0.63 mg Nebulization Q6H  . mouth rinse  15 mL Mouth Rinse 10 times per day  . methylPREDNISolone (SOLU-MEDROL) injection  80 mg Intravenous Q8H  . oseltamivir  75 mg Oral BID  . pantoprazole (PROTONIX) IV  40 mg Intravenous QHS  . potassium chloride  10 mEq Oral Daily  . sodium chloride flush  10-40 mL Intracatheter Q12H  . tapentadol  100 mg Oral Q12H   Continuous Infusions: . sodium chloride    . ceFEPime (MAXIPIME) IV 1 g (11/08/17 1057)  . dextrose 5 % and 0.45% NaCl 50 mL/hr at 11/07/17 2054  . insulin (NOVOLIN-R) infusion 8.1 Units/hr (11/08/17 1046)  . propofol (DIPRIVAN) infusion 35 mcg/kg/min (11/08/17 1057)    Active Problems:   DM type 2 causing vascular disease ([place])   Current smoker   ESOPHAGEAL STRICTURE   Esophageal reflux   Gastroparesis   Cardiac pacemaker in situ   COPD GOLD 0 / still smoking    OSA (obstructive sleep apnea)   Diabetic neuropathy ([facility])   Dysphagia   Crohn's disease without complication ([place])   COPD exacerbation ([employer])   Influenza A   Acute respiratory failure with hypoxia ([place])   Hyponatremia   Hypokalemia  Critical Care Time spent: 33 minutes  Kaisyn Reinhold [employer] [phone]. If 7PM-7AM, please contact night-coverage at [email or web address], password [facility] 11/08/2017, 11:02  AM  LOS: 5 days

## 2017-11-08 NOTE — Progress Notes (Signed)
**Note De-Identified  Obfuscation** CO-OX results  COHb  0.9  SO2  77.2  O2Hb  75.6  tHB  13.1  MetHb  1.2

## 2017-11-08 NOTE — Progress Notes (Addendum)
**Note De-Identified  Obfuscation** CO-OX results:  COHb 0.8  SO2 84.6  O2Hb  82.8  PO2 52.4  MetHb 1.2

## 2017-11-09 ENCOUNTER — Inpatient Hospital Stay (HOSPITAL_COMMUNITY): Payer: Medicaid Other

## 2017-11-09 LAB — GLUCOSE, CAPILLARY
GLUCOSE-CAPILLARY: 156 mg/dL — AB (ref 65–99)
GLUCOSE-CAPILLARY: 159 mg/dL — AB (ref 65–99)
GLUCOSE-CAPILLARY: 172 mg/dL — AB (ref 65–99)
GLUCOSE-CAPILLARY: 208 mg/dL — AB (ref 65–99)
Glucose-Capillary: 177 mg/dL — ABNORMAL HIGH (ref 65–99)
Glucose-Capillary: 189 mg/dL — ABNORMAL HIGH (ref 65–99)
Glucose-Capillary: 167 mg/dL — ABNORMAL HIGH (ref 65–99)
Glucose-Capillary: 157 mg/dL — ABNORMAL HIGH (ref 65–99)
Glucose-Capillary: 181 mg/dL — ABNORMAL HIGH (ref 65–99)
Glucose-Capillary: 160 mg/dL — ABNORMAL HIGH (ref 65–99)
Glucose-Capillary: 167 mg/dL — ABNORMAL HIGH (ref 65–99)
Glucose-Capillary: 205 mg/dL — ABNORMAL HIGH (ref 65–99)
Glucose-Capillary: 161 mg/dL — ABNORMAL HIGH (ref 65–99)
Glucose-Capillary: 140 mg/dL — ABNORMAL HIGH (ref 65–99)
Glucose-Capillary: 272 mg/dL — ABNORMAL HIGH (ref 65–99)

## 2017-11-09 LAB — BLOOD GAS, ARTERIAL
ACID-BASE DEFICIT: 6.7 mmol/L — AB (ref 0.0–2.0)
BICARBONATE: 19.3 mmol/L — AB (ref 20.0–28.0)
DRAWN BY: 105551
FIO2: 55
LHR: 22 {breaths}/min
MECHANICAL RATE: 22
MECHVT: 500 mL
O2 Saturation: 95.1 %
PCO2 ART: 30 mmHg — AB (ref 32.0–48.0)
PEEP/CPAP: 5 cmH2O
PO2 ART: 83.6 mmHg (ref 83.0–108.0)
pH, Arterial: 7.381 (ref 7.350–7.450)

## 2017-11-09 LAB — CBC
HCT: 37.4 % (ref 36.0–46.0)
Hemoglobin: 12.3 g/dL (ref 12.0–15.0)
MCH: 30.4 pg (ref 26.0–34.0)
MCHC: 32.9 g/dL (ref 30.0–36.0)
MCV: 92.6 fL (ref 78.0–100.0)
Platelets: 199 10*3/uL (ref 150–400)
RBC: 4.04 MIL/uL (ref 3.87–5.11)
RDW: 14.9 % (ref 11.5–15.5)
WBC: 25.5 10*3/uL — ABNORMAL HIGH (ref 4.0–10.5)

## 2017-11-09 LAB — COMPREHENSIVE METABOLIC PANEL
ALBUMIN: 2.1 g/dL — AB (ref 3.5–5.0)
ALT: 27 U/L (ref 14–54)
ANION GAP: 9 (ref 5–15)
AST: 34 U/L (ref 15–41)
Alkaline Phosphatase: 64 U/L (ref 38–126)
BUN: 62 mg/dL — ABNORMAL HIGH (ref 6–20)
CHLORIDE: 120 mmol/L — AB (ref 101–111)
CO2: 17 mmol/L — AB (ref 22–32)
Calcium: 8.5 mg/dL — ABNORMAL LOW (ref 8.9–10.3)
Creatinine, Ser: 0.87 mg/dL (ref 0.44–1.00)
GFR calc Af Amer: >60 mL/min (ref >60)
GFR calc non Af Amer: >60 mL/min (ref >60)
GLUCOSE: 174 mg/dL — AB (ref 65–99)
Potassium: 3.8 mmol/L (ref 3.5–5.1)
SODIUM: 146 mmol/L — AB (ref 135–145)
Total Bilirubin: 0.7 mg/dL (ref 0.3–1.2)
Total Protein: 5.9 g/dL — ABNORMAL LOW (ref 6.5–8.1)

## 2017-11-09 MED ORDER — METHYLPREDNISOLONE SODIUM SUCC 40 MG IJ SOLR
40.0000 mg | Freq: Three times a day (TID) | INTRAMUSCULAR | Status: DC
  Administered 2017-11-09 – 2017-11-15 (×18): 40 mg via INTRAVENOUS
  Filled 2017-11-09 (×18): qty 1

## 2017-11-09 MED ORDER — INSULIN ASPART 100 UNIT/ML ~~LOC~~ SOLN
12.0000 [IU] | SUBCUTANEOUS | Status: DC
  Administered 2017-11-09 – 2017-11-10 (×5): 12 [IU] via SUBCUTANEOUS

## 2017-11-09 MED ORDER — INSULIN GLARGINE 100 UNIT/ML ~~LOC~~ SOLN
80.0000 [IU] | Freq: Every day | SUBCUTANEOUS | Status: DC
  Filled 2017-11-09 (×2): qty 0.8

## 2017-11-09 MED ORDER — INSULIN GLARGINE 100 UNIT/ML ~~LOC~~ SOLN
80.0000 [IU] | Freq: Every day | SUBCUTANEOUS | Status: DC
  Administered 2017-11-09: 80 [IU] via SUBCUTANEOUS
  Filled 2017-11-09 (×2): qty 0.8

## 2017-11-09 MED ORDER — FREE WATER
200.0000 mL | Freq: Four times a day (QID) | Status: AC
  Administered 2017-11-09 – 2017-11-10 (×4): 200 mL

## 2017-11-09 NOTE — Progress Notes (Signed)
Subjective: She remains on the ventilator and is intubated.  She is on 55% oxygen now.  Her central venous O2 saturation is good.  No new complaints.  Objective: Vital signs in last 24 hours: Temp:  [98.2 F (36.8 C)-99.8 F (37.7 C)] 99.5 F (37.5 C) (02/17 0740) Pulse Rate:  [38-114] 90 (02/17 1000) Resp:  [17-37] 27 (02/17 1000) BP: (100-138)/(59-94) 115/73 (02/17 1000) SpO2:  [90 %-97 %] 93 % (02/17 1000) FiO2 (%):  [45 %-55 %] 55 % (02/17 0836) Weight:  [93.4 kg (205 lb 14.6 oz)] 93.4 kg (205 lb 14.6 oz) (02/17 0500) Weight change: -1.9 kg (-3 oz) Last BM Date: 11/08/17  Intake/Output from previous day: 02/16 0701 - 02/17 0700 In: -  Out: 1150 [Urine:1150]  PHYSICAL EXAM General appearance: alert and Intubated sedated on ventilator support she is not alert that is an error Resp: rhonchi bilaterally Cardio: regular rate and rhythm, S1, S2 normal, no murmur, click, rub or gallop GI: soft, non-tender; bowel sounds normal; no masses,  no organomegaly Extremities: extremities normal, atraumatic, no cyanosis or edema Skin warm and dry  Lab Results:  Results for orders placed or performed during the hospital encounter of 10/27/2017 (from the past 48 hour(s))  Glucose, capillary     Status: Abnormal   Collection Time: 11/07/17 11:16 AM  Result Value Ref Range   Glucose-Capillary 173 (H) 65 - 99 mg/dL   Comment 1 Notify RN    Comment 2 Document in Chart   Glucose, capillary     Status: Abnormal   Collection Time: 11/07/17  1:06 PM  Result Value Ref Range   Glucose-Capillary 141 (H) 65 - 99 mg/dL  Glucose, capillary     Status: Abnormal   Collection Time: 11/07/17  3:00 PM  Result Value Ref Range   Glucose-Capillary 140 (H) 65 - 99 mg/dL  Glucose, capillary     Status: Abnormal   Collection Time: 11/07/17  3:57 PM  Result Value Ref Range   Glucose-Capillary 130 (H) 65 - 99 mg/dL   Comment 1 Notify RN    Comment 2 Document in Chart   Glucose, capillary     Status:  Abnormal   Collection Time: 11/07/17  5:14 PM  Result Value Ref Range   Glucose-Capillary 168 (H) 65 - 99 mg/dL   Comment 1 Notify RN    Comment 2 Document in Chart   Glucose, capillary     Status: Abnormal   Collection Time: 11/07/17  6:19 PM  Result Value Ref Range   Glucose-Capillary 160 (H) 65 - 99 mg/dL   Comment 1 Notify RN    Comment 2 Document in Chart   Glucose, capillary     Status: Abnormal   Collection Time: 11/07/17  7:26 PM  Result Value Ref Range   Glucose-Capillary 167 (H) 65 - 99 mg/dL   Comment 1 Notify RN    Comment 2 Document in Chart   Glucose, capillary     Status: Abnormal   Collection Time: 11/07/17  8:30 PM  Result Value Ref Range   Glucose-Capillary 145 (H) 65 - 99 mg/dL  Glucose, capillary     Status: Abnormal   Collection Time: 11/07/17  9:31 PM  Result Value Ref Range   Glucose-Capillary 150 (H) 65 - 99 mg/dL   Comment 1 Notify RN    Comment 2 Document in Chart   Glucose, capillary     Status: Abnormal   Collection Time: 11/07/17 10:34 PM  Result Value Ref  Range   Glucose-Capillary 149 (H) 65 - 99 mg/dL   Comment 1 Notify RN    Comment 2 Document in Chart   Glucose, capillary     Status: Abnormal   Collection Time: 11/07/17 11:30 PM  Result Value Ref Range   Glucose-Capillary 162 (H) 65 - 99 mg/dL  Glucose, capillary     Status: Abnormal   Collection Time: 11/08/17 12:34 AM  Result Value Ref Range   Glucose-Capillary 177 (H) 65 - 99 mg/dL  Glucose, capillary     Status: Abnormal   Collection Time: 11/08/17  2:11 AM  Result Value Ref Range   Glucose-Capillary 149 (H) 65 - 99 mg/dL   Comment 1 Notify RN    Comment 2 Document in Chart   Glucose, capillary     Status: Abnormal   Collection Time: 11/08/17  3:51 AM  Result Value Ref Range   Glucose-Capillary 151 (H) 65 - 99 mg/dL   Comment 1 Notify RN    Comment 2 Document in Chart   Draw ABG 1 hour after initiation of ventilator     Status: Abnormal   Collection Time: 11/08/17  3:55 AM   Result Value Ref Range   FIO2 45.00    Delivery systems VENTILATOR    Mode ASSIST CONTROL    VT 500 mL   LHR 22 resp/min   Peep/cpap 5.0 cm H20   pH, Arterial 7.367 7.350 - 7.450   pCO2 arterial 29.6 (L) 32.0 - 48.0 mmHg   pO2, Arterial 87.0 83.0 - 108.0 mmHg   Bicarbonate 19.0 (L) 20.0 - 28.0 mmol/L   Acid-base deficit 7.7 (H) 0.0 - 2.0 mmol/L   O2 Saturation 95.2 %   Collection site LEFT RADIAL    Drawn by [id number]    Sample type ARTERIAL    Allens test (pass/fail) PASS PASS    Comment: Performed at [facility], [address], [place], [place] [address]  Basic metabolic panel     Status: Abnormal   Collection Time: 11/08/17  4:33 AM  Result Value Ref Range   Sodium 141 135 - 145 mmol/L   Potassium 3.6 3.5 - 5.1 mmol/L   Chloride 116 (H) 101 - 111 mmol/L   CO2 16 (L) 22 - 32 mmol/L   Glucose, Bld 181 (H) 65 - 99 mg/dL   BUN 57 (H) 6 - 20 mg/dL   Creatinine, Ser 0.95 0.44 - 1.00 mg/dL   Calcium 8.5 (L) 8.9 - 10.3 mg/dL   GFR calc non Af Amer >60 >60 mL/min   GFR calc Af Amer >60 >60 mL/min    Comment: (NOTE) The eGFR has been calculated using the CKD EPI equation. This calculation has not been validated in all clinical situations. eGFR's persistently <60 mL/min signify possible Chronic Kidney Disease.    Anion gap 9 5 - 15    Comment: Performed at [facility], [address], [place], [place] [address]  CBC     Status: Abnormal   Collection Time: 11/08/17  4:33 AM  Result Value Ref Range   WBC 22.3 (H) 4.0 - 10.5 K/uL   RBC 4.07 3.87 - 5.11 MIL/uL   Hemoglobin 12.6 12.0 - 15.0 g/dL   HCT 38.2 36.0 - 46.0 %   MCV 93.9 78.0 - 100.0 fL   MCH 31.0 26.0 - 34.0 pg   MCHC 33.0 30.0 - 36.0 g/dL   RDW 15.0 11.5 - 15.5 %   Platelets 201 150 - 400 K/uL  Comment: Performed at [facility], [address], [place], [place] [address]  Glucose, capillary     Status: Abnormal   Collection Time: 11/08/17  4:51 AM  Result Value Ref Range   Glucose-Capillary 167 (H) 65 - 99 mg/dL   Glucose, capillary     Status: Abnormal   Collection Time: 11/08/17  6:16 AM  Result Value Ref Range   Glucose-Capillary 170 (H) 65 - 99 mg/dL  Glucose, capillary     Status: Abnormal   Collection Time: 11/08/17  7:35 AM  Result Value Ref Range   Glucose-Capillary 172 (H) 65 - 99 mg/dL  Lactic acid, plasma     Status: None   Collection Time: 11/08/17  8:37 AM  Result Value Ref Range   Lactic Acid, Venous 1.7 0.5 - 1.9 mmol/L    Comment: Performed at [facility], [address] [place], [place] [address]  Glucose, capillary     Status: Abnormal   Collection Time: 11/08/17  8:41 AM  Result Value Ref Range   Glucose-Capillary 164 (H) 65 - 99 mg/dL  Glucose, capillary     Status: Abnormal   Collection Time: 11/08/17  9:40 AM  Result Value Ref Range   Glucose-Capillary 157 (H) 65 - 99 mg/dL  .Cooxemetry Panel (carboxy, met, total hgb, O2 sat)     Status: None   Collection Time: 11/08/17 10:25 AM  Result Value Ref Range   Total hemoglobin 12.9 12.0 - 16.0 g/dL   O2 Saturation 84.6 %   Carboxyhemoglobin 0.8 0.5 - 1.5 %   Methemoglobin 1.2 0.0 - 1.5 %   Total oxygen content 15.0 15.0 - 23.0 mL/dL    Comment: Performed at [facility], [address] [place], [place] [address]  Glucose, capillary     Status: Abnormal   Collection Time: 11/08/17 10:44 AM  Result Value Ref Range   Glucose-Capillary 152 (H) 65 - 99 mg/dL  Glucose, capillary     Status: Abnormal   Collection Time: 11/08/17 11:47 AM  Result Value Ref Range   Glucose-Capillary 149 (H) 65 - 99 mg/dL  Glucose, capillary     Status: Abnormal   Collection Time: 11/08/17 12:55 PM  Result Value Ref Range   Glucose-Capillary 190 (H) 65 - 99 mg/dL  Glucose, capillary     Status: Abnormal   Collection Time: 11/08/17  1:49 PM  Result Value Ref Range   Glucose-Capillary 175 (H) 65 - 99 mg/dL  .Cooxemetry Panel (carboxy, met, total hgb, O2 sat)     Status: None   Collection Time: 11/08/17  2:00 PM  Result Value Ref Range    Total hemoglobin 12.8 12.0 - 16.0 g/dL   O2 Saturation 96.8 %   Carboxyhemoglobin 0.9 0.5 - 1.5 %   Methemoglobin 1.2 0.0 - 1.5 %   Total oxygen content 17.2 15.0 - 23.0 mL/dL    Comment: Performed at [facility], [address], [place], [place] [address]  Glucose, capillary     Status: Abnormal   Collection Time: 11/08/17  3:00 PM  Result Value Ref Range   Glucose-Capillary 149 (H) 65 - 99 mg/dL  Glucose, capillary     Status: Abnormal   Collection Time: 11/08/17  4:10 PM  Result Value Ref Range   Glucose-Capillary 153 (H) 65 - 99 mg/dL  Glucose, capillary     Status: Abnormal   Collection Time: 11/08/17  5:16 PM  Result Value Ref Range   Glucose-Capillary 149 (H) 65 - 99 mg/dL  Glucose,  capillary     Status: Abnormal   Collection Time: 11/08/17  6:12 PM  Result Value Ref Range   Glucose-Capillary 173 (H) 65 - 99 mg/dL  .Cooxemetry Panel (carboxy, met, total hgb, O2 sat)     Status: Abnormal   Collection Time: 11/08/17  6:30 PM  Result Value Ref Range   Total hemoglobin 13.1 12.0 - 16.0 g/dL   O2 Saturation 77.2 %   Carboxyhemoglobin 0.9 0.5 - 1.5 %   Methemoglobin 1.2 0.0 - 1.5 %   Total oxygen content 13.9 (L) 15.0 - 23.0 mL/dL    Comment: Performed at [facility], [address] [place], [place] [address]  Glucose, capillary     Status: Abnormal   Collection Time: 11/08/17  7:19 PM  Result Value Ref Range   Glucose-Capillary 170 (H) 65 - 99 mg/dL   Comment 1 Notify RN   Glucose, capillary     Status: Abnormal   Collection Time: 11/08/17  8:40 PM  Result Value Ref Range   Glucose-Capillary 157 (H) 65 - 99 mg/dL   Comment 1 Notify RN   Glucose, capillary     Status: Abnormal   Collection Time: 11/08/17  9:29 PM  Result Value Ref Range   Glucose-Capillary 133 (H) 65 - 99 mg/dL   Comment 1 Notify RN   Glucose, capillary     Status: Abnormal   Collection Time: 11/08/17 10:38 PM  Result Value Ref Range   Glucose-Capillary 139 (H) 65 - 99 mg/dL   Comment 1 Notify RN    Glucose, capillary     Status: Abnormal   Collection Time: 11/08/17 11:32 PM  Result Value Ref Range   Glucose-Capillary 181 (H) 65 - 99 mg/dL   Comment 1 Notify RN   Glucose, capillary     Status: Abnormal   Collection Time: 11/09/17 12:37 AM  Result Value Ref Range   Glucose-Capillary 172 (H) 65 - 99 mg/dL   Comment 1 Notify RN   Glucose, capillary     Status: Abnormal   Collection Time: 11/09/17  1:32 AM  Result Value Ref Range   Glucose-Capillary 177 (H) 65 - 99 mg/dL   Comment 1 Notify RN   Glucose, capillary     Status: Abnormal   Collection Time: 11/09/17  2:36 AM  Result Value Ref Range   Glucose-Capillary 189 (H) 65 - 99 mg/dL   Comment 1 Notify RN   Glucose, capillary     Status: Abnormal   Collection Time: 11/09/17  3:49 AM  Result Value Ref Range   Glucose-Capillary 167 (H) 65 - 99 mg/dL   Comment 1 Notify RN   Draw ABG 1 hour after initiation of ventilator     Status: Abnormal   Collection Time: 11/09/17  4:30 AM  Result Value Ref Range   FIO2 55.00    Delivery systems VENTILATOR    Mode ASSIST CONTROL    VT 500 mL   LHR 22 resp/min   Peep/cpap 5.0 cm H20   pH, Arterial 7.381 7.350 - 7.450   pCO2 arterial 30.0 (L) 32.0 - 48.0 mmHg   pO2, Arterial 83.6 83.0 - 108.0 mmHg   Bicarbonate 19.3 (L) 20.0 - 28.0 mmol/L   Acid-base deficit 6.7 (H) 0.0 - 2.0 mmol/L   O2 Saturation 95.1 %   Collection site RADIAL    Drawn by [id number]    Sample type ARTERIAL    Allens test (pass/fail) PASS PASS   Mechanical Rate 22  Comment: Performed at [facility], [address], [place], [place] [address]  Glucose, capillary     Status: Abnormal   Collection Time: 11/09/17  4:45 AM  Result Value Ref Range   Glucose-Capillary 156 (H) 65 - 99 mg/dL   Comment 1 Notify RN   Glucose, capillary     Status: Abnormal   Collection Time: 11/09/17  5:32 AM  Result Value Ref Range   Glucose-Capillary 159 (H) 65 - 99 mg/dL   Comment 1 Notify RN   CBC     Status: Abnormal    Collection Time: 11/09/17  6:00 AM  Result Value Ref Range   WBC 25.5 (H) 4.0 - 10.5 K/uL    Comment: WHITE COUNT CONFIRMED ON SMEAR   RBC 4.04 3.87 - 5.11 MIL/uL   Hemoglobin 12.3 12.0 - 15.0 g/dL   HCT 37.4 36.0 - 46.0 %   MCV 92.6 78.0 - 100.0 fL   MCH 30.4 26.0 - 34.0 pg   MCHC 32.9 30.0 - 36.0 g/dL   RDW 14.9 11.5 - 15.5 %   Platelets 199 150 - 400 K/uL    Comment: Performed at [facility], [address], [place], [place] [address]  Comprehensive metabolic panel     Status: Abnormal   Collection Time: 11/09/17  6:00 AM  Result Value Ref Range   Sodium 146 (H) 135 - 145 mmol/L   Potassium 3.8 3.5 - 5.1 mmol/L   Chloride 120 (H) 101 - 111 mmol/L   CO2 17 (L) 22 - 32 mmol/L   Glucose, Bld 174 (H) 65 - 99 mg/dL   BUN 62 (H) 6 - 20 mg/dL   Creatinine, Ser 0.87 0.44 - 1.00 mg/dL   Calcium 8.5 (L) 8.9 - 10.3 mg/dL   Total Protein 5.9 (L) 6.5 - 8.1 g/dL   Albumin 2.1 (L) 3.5 - 5.0 g/dL   AST 34 15 - 41 U/L   ALT 27 14 - 54 U/L   Alkaline Phosphatase 64 38 - 126 U/L   Total Bilirubin 0.7 0.3 - 1.2 mg/dL   GFR calc non Af Amer >60 >60 mL/min   GFR calc Af Amer >60 >60 mL/min    Comment: (NOTE) The eGFR has been calculated using the CKD EPI equation. This calculation has not been validated in all clinical situations. eGFR's persistently <60 mL/min signify possible Chronic Kidney Disease.    Anion gap 9 5 - 15    Comment: Performed at [facility], [address], [place], [place] [address]  Glucose, capillary     Status: Abnormal   Collection Time: 11/09/17  6:28 AM  Result Value Ref Range   Glucose-Capillary 157 (H) 65 - 99 mg/dL   Comment 1 Notify RN   Glucose, capillary     Status: Abnormal   Collection Time: 11/09/17  7:36 AM  Result Value Ref Range   Glucose-Capillary 181 (H) 65 - 99 mg/dL  Glucose, capillary     Status: Abnormal   Collection Time: 11/09/17  8:34 AM  Result Value Ref Range   Glucose-Capillary 160 (H) 65 - 99 mg/dL  Glucose, capillary     Status:  Abnormal   Collection Time: 11/09/17  9:57 AM  Result Value Ref Range   Glucose-Capillary 167 (H) 65 - 99 mg/dL    ABGS Recent Labs    11/09/17 0430  PHART 7.381  PO2ART 83.6  HCO3 19.3*   CULTURES Recent Results (from the past 240 hour(s))  Blood Culture (routine x 2)  Status: None   Collection Time: 10/26/2017  6:27 PM  Result Value Ref Range Status   Specimen Description RIGHT ANTECUBITAL  Final   Special Requests   Final    BOTTLES DRAWN AEROBIC AND ANAEROBIC Blood Culture adequate volume   Culture   Final    NO GROWTH 5 DAYS Performed at [facility], [address] [place], [place] [address]    Report Status 11/08/2017 FINAL  Final  Blood Culture (routine x 2)     Status: None   Collection Time: 10/28/2017  6:27 PM  Result Value Ref Range Status   Specimen Description RIGHT ANTECUBITAL  Final   Special Requests   Final    BOTTLES DRAWN AEROBIC AND ANAEROBIC Blood Culture adequate volume   Culture   Final    NO GROWTH 5 DAYS Performed at [facility], [address] [place], [place] [address]    Report Status 11/08/2017 FINAL  Final  MRSA PCR Screening     Status: None   Collection Time: 11/04/17  1:26 AM  Result Value Ref Range Status   MRSA by PCR NEGATIVE NEGATIVE Final    Comment:        The GeneXpert MRSA Assay (FDA approved for NASAL specimens only), is one component of a comprehensive MRSA colonization surveillance program. It is not intended to diagnose MRSA infection nor to guide or monitor treatment for MRSA infections. Performed at [facility], [address], [place],  [address]    Studies/Results: Dg Chest 1 View  Result Date: 11/09/2017 CLINICAL DATA:  Respiratory failure EXAM: CHEST 1 VIEW COMPARISON:  11/08/2017 FINDINGS: Stable endotracheal tube, NG tube, and right PICC line positions. Left subclavian 2 lead pacer noted. Cardiomegaly remains with diffuse airspace process versus edema. No enlarging effusion or pneumothorax. No  significant interval change in aeration. IMPRESSION: Stable cardiomegaly with diffuse airspace process versus edema. Electronically Signed   By: Jerilynn Mages.  Shick M.D.   On: 11/09/2017 08:12   Dg Chest Port 1 View  Result Date: 11/08/2017 CLINICAL DATA:  Respiratory failure EXAM: PORTABLE CHEST 1 VIEW COMPARISON:  11/07/2017 FINDINGS: Endotracheal tube in good position. NG remains in place. Dual lead pacemaker remains in place Bilateral airspace disease unchanged.  No effusion. Right arm PICC tip in the mid SVC. IMPRESSION: Endotracheal tube remains in good position. Bilateral airspace disease unchanged. Possible edema versus pneumonia. Electronically Signed   By: Franchot Gallo M.D.   On: 11/08/2017 08:23    Medications:  Prior to Admission:  Medications Prior to Admission  Medication Sig Dispense Refill Last Dose  . acetaminophen (TYLENOL) 500 MG tablet Take 500 mg by mouth every 6 (six) hours as needed for mild pain or moderate pain.   11/02/2017 at Unknown time  . aspirin EC 81 MG tablet Take 81 mg by mouth daily.   11/13/2017 at Unknown time  . carvedilol (COREG) 6.25 MG tablet Take 1 tablet (6.25 mg total) by mouth 2 (two) times daily. 180 tablet 3 10/28/2017 at Unknown time  . Cholecalciferol (VITAMIN D3) 5000 units CAPS Take 1 capsule (5,000 Units total) by mouth daily. 90 capsule 0 10/31/2017 at Unknown time  . clopidogrel (PLAVIX) 75 MG tablet Take 75 mg by mouth daily.   11/05/2017 at Unknown time  . diclofenac sodium (VOLTAREN) 1 % GEL Apply 4 g topically 4 (four) times daily. (Patient taking differently: Apply 2-4 g topically daily as needed (for leg pain). ) 200 g 11 Past Month at Unknown time  . dicyclomine (  BENTYL) 20 MG tablet Take 1 tablet (20 mg total) by mouth every 8 (eight) hours as needed for spasms. 90 tablet 3 10/26/2017 at Unknown time  . diphenoxylate-atropine (LOMOTIL) 2.5-0.025 MG tablet Take 2 tabs by mouth x 1, then 1 tab every 4 hrs PRN, max of 8 tabs in 24 hour time 240 tablet 1  Past Week at Unknown time  . DULoxetine (CYMBALTA) 30 MG capsule Take 90 mg daily (60 mg + 30 mg) (Patient taking differently: Take 30 mg by mouth daily. ) 30 capsule 0 11/17/2017 at Unknown time  . DULoxetine (CYMBALTA) 60 MG capsule Take 90 mg daily (60 mg + 30 mg) 30 capsule 0 11/15/2017 at Unknown time  . furosemide (LASIX) 40 MG tablet TAKE (1) TABLET BY MOUTH EACH MORNING. 90 tablet 3 11/18/2017 at Unknown time  . ibuprofen (ADVIL,MOTRIN) 600 MG tablet Take 600 mg by mouth 2 (two) times daily as needed for moderate pain.    10/26/2017 at Unknown time  . insulin aspart (NOVOLOG FLEXPEN) 100 UNIT/ML FlexPen Inject 15-21 Units into the skin 3 (three) times daily with meals. 5 pen 2 11/20/2017 at Unknown time  . INVOKANA 100 MG TABS tablet TAKE 1 TABLET BY MOUTH DAILY BEFORE BREAKFAST. 30 tablet 2 11/15/2017 at Unknown time  . LEVEMIR FLEXTOUCH 100 UNIT/ML Pen INJECT 80 UNITS UNDER THE SKIN EVERY DAY AT 10 PM 30 mL 2 11/02/2017 at Unknown time  . lisinopril (PRINIVIL,ZESTRIL) 5 MG tablet Take 5 mg by mouth daily.   10/30/2017 at Unknown time  . LORazepam (ATIVAN) 0.5 MG tablet Take 1 tablet (0.5 mg total) by mouth daily as needed for anxiety. 30 tablet 0 Past Week at Unknown time  . LYRICA 150 MG capsule take 126m by mouth three times daily  0 11/14/2017 at Unknown time  . meloxicam (MOBIC) 15 MG tablet Take 15 mg by mouth daily.   11/18/2017 at Unknown time  . metFORMIN (GLUCOPHAGE) 1000 MG tablet TAKE 1 TABLET(1000 MG) BY MOUTH TWICE DAILY WITH A MEAL 60 tablet 3 11/09/2017 at Unknown time  . nitroGLYCERIN (NITROSTAT) 0.4 MG SL tablet Place 1 tablet (0.4 mg total) under the tongue every 5 (five) minutes as needed for chest pain. 30 tablet 0 unknown  . pantoprazole (PROTONIX) 40 MG tablet Take 1 tablet (40 mg total) by mouth 2 (two) times daily. 60 tablet 0 11/05/2017 at Unknown time  . potassium chloride (K-DUR) 10 MEQ tablet Take 1 tablet (10 mEq total) by mouth daily. 90 tablet 3 11/20/2017 at Unknown time   . pramipexole (MIRAPEX) 0.25 MG tablet Take 0.25 mg by mouth 2 (two) times daily.    11/10/2017 at Unknown time  . simvastatin (ZOCOR) 10 MG tablet TAKE (1) TABLET BY MOUTH DAILY. (Patient taking differently: TAKE (2) TABLET BY MOUTH DAILY.) 90 tablet 2 11/12/2017 at Unknown time  . tapentadol (NUCYNTA ER) 100 MG 12 hr tablet Take 100 mg by mouth every 12 (twelve) hours.   11/02/2017 at Unknown time  . tiZANidine (ZANAFLEX) 4 MG capsule Take 4 mg by mouth 3 (three) times daily.   11/02/2017 at Unknown time  . albuterol (PROVENTIL) (2.5 MG/3ML) 0.083% nebulizer solution Take 3 mLs (2.5 mg total) by nebulization every 6 (six) hours as needed for wheezing or shortness of breath. 75 mL 12 unknown  . colestipol (COLESTID) 1 g tablet Take 1 tablet (1 g total) by mouth 2 (two) times daily. (Patient not taking: Reported on 11/05/2017) 90 tablet 3 unknown  . inFLIXimab  500 mg in sodium chloride 0.9 % 200 mL Inject 500 mg into the vein every 8 (eight) weeks. 1 Dose 6 Taking  . OXYGEN Inhale 3 L/day into the lungs at bedtime.   Taking   Scheduled: . aspirin EC  81 mg Oral Daily  . atorvastatin  80 mg Oral q1800  . budesonide (PULMICORT) nebulizer solution  0.5 mg Nebulization BID  . chlorhexidine gluconate (MEDLINE KIT)  15 mL Mouth Rinse BID  . Chlorhexidine Gluconate Cloth  6 each Topical Daily  . clopidogrel  75 mg Oral Daily  . feeding supplement (PRO-STAT SUGAR FREE 64)  60 mL Per Tube TID  . feeding supplement (VITAL HIGH PROTEIN)  1,000 mL Per Tube Q24H  . free water  200 mL Per Tube Q6H  . heparin injection (subcutaneous)  5,000 Units Subcutaneous Q8H  . insulin aspart  12 Units Subcutaneous Q4H  . insulin glargine  80 Units Subcutaneous Daily  . ipratropium  0.5 mg Nebulization Q6H  . levalbuterol  0.63 mg Nebulization Q6H  . mouth rinse  15 mL Mouth Rinse 10 times per day  . methylPREDNISolone (SOLU-MEDROL) injection  40 mg Intravenous Q8H  . pantoprazole (PROTONIX) IV  40 mg Intravenous QHS   . potassium chloride  10 mEq Oral Daily  . sodium chloride flush  10-40 mL Intracatheter Q12H  . tapentadol  100 mg Oral Q12H   Continuous: . ceFEPime (MAXIPIME) IV Stopped (11/09/17 6283)  . insulin (NOVOLIN-R) infusion 9.2 Units/hr (11/09/17 1000)  . propofol (DIPRIVAN) infusion 55 mcg/kg/min (11/09/17 0959)   MOQ:[id number] **OR** acetaminophen, fentaNYL (SUBLIMAZE) injection, fentaNYL (SUBLIMAZE) injection, levalbuterol, LORazepam, sodium chloride flush  Assesment: She was admitted with COPD exacerbation influenza and acute hypoxic  respiratory failure.  She also has what looks like diffuse bilateral pneumonia.  She had been down to 45% oxygen but is back up to 55% now.  She has acute systolic heart failure likely related to her viral illness  She has diabetic ketoacidosis on insulin drip  She has COPD at baseline and that is being treated Active Problems:   DM type 2 causing vascular disease ([place])   Current smoker   ESOPHAGEAL STRICTURE   Esophageal reflux   Gastroparesis   Cardiac pacemaker in situ   COPD GOLD 0 / still smoking    OSA (obstructive sleep apnea)   Diabetic neuropathy (HCC)   Dysphagia   Crohn's disease without complication (HCC)   COPD exacerbation (HCC)   Influenza A   Acute respiratory failure with hypoxia (HCC)   Hyponatremia   Hypokalemia    Plan: We do not have any room to try any weaning today.    LOS: 6 days   Jacqulene Huntley L 11/09/2017, 10:49 AM

## 2017-11-09 NOTE — Progress Notes (Signed)
Pharmacy Antibiotic Note  [name] is a 52 y.o. female admitted on 11/13/2017 with pneumonia.  Pharmacy has been consulted for cefepime dosing.  Plan: Continue cefepime 1000 mg IV Q8 hours  Monitor labs,cultures, and patient improvement  Height: 5\' 4"  (162.6 cm) Weight: 205 lb 14.6 oz (93.4 kg) IBW/kg (Calculated) : 54.7  Temp (24hrs), Avg:99 F (37.2 C), Min:98.2 F (36.8 C), Max:99.8 F (37.7 C)  Recent Labs  Lab 10/26/2017 1826 11/04/17 0023 11/04/17 0433 11/04/17 0851 11/04/17 1219  11/05/17 0458 11/06/17 0513 11/07/17 0556 11/08/17 0433 11/08/17 0837 11/09/17 0600  WBC  --   --  11.4*  --   --   --   --  12.9* 16.6* 22.3*  --  25.5*  CREATININE  --   --  0.95 1.23* 1.23*   < > 1.48* 1.33* 1.10* 0.95  --  0.87  LATICACIDVEN 2.81* 1.89  --  2.3* 2.2*  --   --   --   --   --  1.7  --   VANCOTROUGH  --   --   --   --   --   --   --  38*  --   --   --   --    < > = values in this interval not displayed.    Estimated Creatinine Clearance: 84.8 mL/min (by C-G formula based on SCr of 0.87 mg/dL).    Allergies  Allergen Reactions  . Flexeril [Cyclobenzaprine] Hives  . Amoxicillin Hives and Rash    Has patient had a PCN reaction causing immediate rash, facial/tongue/throat swelling, SOB or lightheadedness with hypotension: Yes Has patient had a PCN reaction causing severe rash involving mucus membranes or skin necrosis: Yes Has patient had a PCN reaction that required hospitalization No Has patient had a PCN reaction occurring within the last 10 years: Yes If all of the above answers are "NO", then may proceed with Cephalosporin use.     Antimicrobials this admission: Vanc 2/11 >> 2/14 Levaquin 2/11 >> 2/14 Cefepime 2/12 >> Tamiflu 2/11>>2/16   Dose adjustments this admission: N/A  Microbiology results: 2/11 BCx: ng final 2/12 MRSA PCR:  negative 2/12 influenza + on tamiflu  Thank you for allowing pharmacy to be a part of this patient's care.  Tad Moore 11/09/2017 12:54 PM

## 2017-11-09 NOTE — Progress Notes (Signed)
[facility] PROGRESS NOTE  [name] PZW:[id number] DOB: 08/30/1966 DOA: 10/31/2017 PCP: [facility] [place]  Interim summary and HPI 52 y.o. female,  w Copd on home O2, apparently ran out of her breathing treatments and c/o increase in dyspnea. Pt states that her breathing started to become worse on Friday.  + cough w yellow sputum.  Denies fever, chills, cp, palpitations, brbpr.   Pt notes n/v, general malaise and diarrhea over the past 2 days.   In ED, pox on RA was in the mid to high 80's; found to have positive influenza A. CXR also demonstrated coarse interstitial marking and vascular congestion; unable to r/o underlying infiltrates. WBC's 12.6, fever 102.6, RR 25  Of, note patient was also found to be on DKA and 2-D Echo demonstrated EF of 30% with concerns for Takotsubo, cardiology following.   Assessment/Plan: 1-acute resp failure with hypoxia: in the setting of COPD exacerbation and influenza A and pneumonia -continue ventilatory support -appreciate assistance by pulmonary/critical care service -continue steroids, Duoneb and antibiotics -continue pulmicort BID and atrovent QID -pulmonary will try weaning vent today -continue current antibiotics for pneumonia coverage.  2-DKA: -in the setting of steroid use and infection and possibly invokana (currently being held) -Will try to get off IV insulin infusion today.  See orders.    -follow CBGs -follow BMP  3-acute Cardiomyopathy: concern for takotsubo; positive apical wall motion abnormalities and decrease EF; no prior hx of CAD or heart failure.  -cardiology consulted and working patient up -PICC line to check CVP and COX -diuretics BEING HELD per cardiology service -given low BP now, will avoid the use of B-blocker, ARB/ACEI -daily weights and strict intake and output  Filed Weights   11/08/17 0345 11/08/17 0445 11/09/17 0500  Weight: 95.3 kg (210 lb 1.6 oz) 97.6 kg (215 lb 2.7 oz) 93.4 kg (205 lb  14.6 oz)   4-influenza A - treated -will continue providing supportive care and completed course of tamiflu  5-GERD/GI prophylaxis -continue PPI  6-Sepsis: clinically improving  -code sepsis initiated -continue cefepime  7. Elevated troponin / ACS - pt was treated with 48 hours of IV heparin infusion  Code Status: Full Family Communication: no family at bedside Disposition Plan: remains in ICU, continue ventilatory support.   Consultants:  Cardiology  Pulmonary/critical care  Procedures:  See below for x-ray reports   2-D echo: EF 30%, apical WMA  Antibiotics:  Cefepime, vancomycin and levaquin 2/12  HPI/Subjective: Currently afebrile and sedated on vent.    Objective: Vitals:   11/09/17 0900 11/09/17 0915  BP: 125/77 120/75  Pulse: 95 89  Resp: (!) 36 (!) 31  Temp:    SpO2: 92% 93%    Intake/Output Summary (Last 24 hours) at 11/09/2017 0956 Last data filed at 11/09/2017 0855 Gross per 24 hour  Intake 1574.81 ml  Output 1150 ml  Net 424.81 ml   Filed Weights   11/08/17 0345 11/08/17 0445 11/09/17 0500  Weight: 95.3 kg (210 lb 1.6 oz) 97.6 kg (215 lb 2.7 oz) 93.4 kg (205 lb 14.6 oz)    Exam:   General:  Afebrile, sedated and ventilated.  Cardiovascular: normal S1 and S2, no rubs or gallops.   Respiratory: expiratory wheezes heard bilateral better air movement noted. Crackles heard bases.   Abdomen: obese, soft, NT, ND, positive BS.  Musculoskeletal: SCDs bilateral LEs, no cyanosis, no clubbing   Data Reviewed: Basic Metabolic Panel: Recent Labs  Lab 11/05/17 0458 11/06/17 0513 11/06/17 0610 11/07/17  5093 11/08/17 0433 11/09/17 0600  NA 142 140  --  141 141 146*  K 3.4* 3.2*  --  3.3* 3.6 3.8  CL 109 110  --  114* 116* 120*  CO2 19* 18*  --  17* 16* 17*  GLUCOSE 158* 174*  --  146* 181* 174*  BUN 48* 51*  --  55* 57* 62*  CREATININE 1.48* 1.33*  --  1.10* 0.95 0.87  CALCIUM 8.6* 8.7*  --  8.7* 8.5* 8.5*  MG  --   --  1.9 2.2  --    --    Liver Function Tests: Recent Labs  Lab 11/11/2017 1752 11/04/17 0433 11/09/17 0600  AST 31 42* 34  ALT _0 ALKPHOS 68 67 64  BILITOT 0.9 1.2 0.7  PROT 7.4 7.3 5.9*  ALBUMIN 3.1* 3.0* 2.1*   Recent Labs  Lab 11/18/2017 1806  LIPASE 19   CBC: Recent Labs  Lab 11/09/2017 1752 11/04/17 0433 11/06/17 0513 11/07/17 0556 11/08/17 0433 11/09/17 0600  WBC 12.6* 11.4* 12.9* 16.6* 22.3* 25.5*  NEUTROABS 9.4*  --   --   --   --   --   HGB 16.1* 15.6* 12.6 12.0 12.6 12.3  HCT 48.1* 47.8* 38.4 37.1 38.2 37.4  MCV 92.9 95.0 92.5 93.9 93.9 92.6  PLT 123* 118* 156 185 201 199   Cardiac Enzymes: Recent Labs  Lab 11/05/17 0458 11/05/17 0909 11/05/17 1439 11/05/17 2036 11/06/17 0909  TROPONINI 1.26* 1.18* 1.30* 1.68* 1.47*   BNP (last 3 results) Recent Labs    09/20/17 2346 11/08/2017 1753 11/04/17 1619  BNP 227.0* 36.0 368.0*    CBG: Recent Labs  Lab 11/09/17 0445 11/09/17 0532 11/09/17 0628 11/09/17 0736 11/09/17 0834  GLUCAP 156* 159* 157* 181* 160*    Recent Results (from the past 240 hour(s))  Blood Culture (routine x 2)     Status: None   Collection Time: 11/04/2017  6:27 PM  Result Value Ref Range Status   Specimen Description RIGHT ANTECUBITAL  Final   Special Requests   Final    BOTTLES DRAWN AEROBIC AND ANAEROBIC Blood Culture adequate volume   Culture   Final    NO GROWTH 5 DAYS Performed at [facility], [address], [place], [place] [address]    Report Status 11/08/2017 FINAL  Final  Blood Culture (routine x 2)     Status: None   Collection Time: 11/02/2017  6:27 PM  Result Value Ref Range Status   Specimen Description RIGHT ANTECUBITAL  Final   Special Requests   Final    BOTTLES DRAWN AEROBIC AND ANAEROBIC Blood Culture adequate volume   Culture   Final    NO GROWTH 5 DAYS Performed at [facility], [address] [place], [place] [address]    Report Status 11/08/2017 FINAL  Final  MRSA PCR Screening     Status: None   Collection  Time: 11/04/17  1:26 AM  Result Value Ref Range Status   MRSA by PCR NEGATIVE NEGATIVE Final    Comment:        The GeneXpert MRSA Assay (FDA approved for NASAL specimens only), is one component of a comprehensive MRSA colonization surveillance program. It is not intended to diagnose MRSA infection nor to guide or monitor treatment for MRSA infections. Performed at [facility], [address] [place], [place] [address]      Studies: Dg Chest 1 View  Result Date: 11/09/2017 CLINICAL DATA:  Respiratory failure  EXAM: CHEST 1 VIEW COMPARISON:  11/08/2017 FINDINGS: Stable endotracheal tube, NG tube, and right PICC line positions. Left subclavian 2 lead pacer noted. Cardiomegaly remains with diffuse airspace process versus edema. No enlarging effusion or pneumothorax. No significant interval change in aeration. IMPRESSION: Stable cardiomegaly with diffuse airspace process versus edema. Electronically Signed   By: Jerilynn Mages.  Shick M.D.   On: 11/09/2017 08:12   Dg Chest Port 1 View  Result Date: 11/08/2017 CLINICAL DATA:  Respiratory failure EXAM: PORTABLE CHEST 1 VIEW COMPARISON:  11/07/2017 FINDINGS: Endotracheal tube in good position. NG remains in place. Dual lead pacemaker remains in place Bilateral airspace disease unchanged.  No effusion. Right arm PICC tip in the mid SVC. IMPRESSION: Endotracheal tube remains in good position. Bilateral airspace disease unchanged. Possible edema versus pneumonia. Electronically Signed   By: Franchot Gallo M.D.   On: 11/08/2017 08:23   Scheduled Meds: . aspirin EC  81 mg Oral Daily  . atorvastatin  80 mg Oral q1800  . budesonide (PULMICORT) nebulizer solution  0.5 mg Nebulization BID  . chlorhexidine gluconate (MEDLINE KIT)  15 mL Mouth Rinse BID  . Chlorhexidine Gluconate Cloth  6 each Topical Daily  . clopidogrel  75 mg Oral Daily  . feeding supplement (PRO-STAT SUGAR FREE 64)  60 mL Per Tube TID  . feeding supplement (VITAL HIGH PROTEIN)  1,000 mL Per  Tube Q24H  . free water  200 mL Per Tube Q6H  . heparin injection (subcutaneous)  5,000 Units Subcutaneous Q8H  . ipratropium  0.5 mg Nebulization Q6H  . levalbuterol  0.63 mg Nebulization Q6H  . mouth rinse  15 mL Mouth Rinse 10 times per day  . methylPREDNISolone (SOLU-MEDROL) injection  40 mg Intravenous Q8H  . pantoprazole (PROTONIX) IV  40 mg Intravenous QHS  . potassium chloride  10 mEq Oral Daily  . sodium chloride flush  10-40 mL Intracatheter Q12H  . tapentadol  100 mg Oral Q12H   Continuous Infusions: . ceFEPime (MAXIPIME) IV Stopped (11/09/17 4287)  . insulin (NOVOLIN-R) infusion 8.6 Units/hr (11/09/17 0855)  . propofol (DIPRIVAN) infusion 55 mcg/kg/min (11/09/17 0617)    Active Problems:   DM type 2 causing vascular disease ([place])   Current smoker   ESOPHAGEAL STRICTURE   Esophageal reflux   Gastroparesis   Cardiac pacemaker in situ   COPD GOLD 0 / still smoking    OSA (obstructive sleep apnea)   Diabetic neuropathy ([place])   Dysphagia   Crohn's disease without complication ([place])   COPD exacerbation ([place])   Influenza A   Acute respiratory failure with hypoxia ([place])   Hyponatremia   Hypokalemia  Critical Care Time spent: 35 minutes  Chermaine Schnyder [employer] [phone]. If 7PM-7AM, please contact night-coverage at [email or web address], password [email or web address] 11/09/2017, 9:56 AM  LOS: 6 days

## 2017-11-10 ENCOUNTER — Inpatient Hospital Stay (HOSPITAL_COMMUNITY): Payer: Medicaid Other

## 2017-11-10 ENCOUNTER — Ambulatory Visit (HOSPITAL_COMMUNITY): Payer: Medicaid Other | Admitting: Psychiatry

## 2017-11-10 DIAGNOSIS — R748 Abnormal levels of other serum enzymes: Secondary | ICD-10-CM

## 2017-11-10 DIAGNOSIS — E870 Hyperosmolality and hypernatremia: Secondary | ICD-10-CM

## 2017-11-10 LAB — CBC
HEMATOCRIT: 39.5 % (ref 36.0–46.0)
HEMOGLOBIN: 12.5 g/dL (ref 12.0–15.0)
MCH: 30 pg (ref 26.0–34.0)
MCHC: 31.6 g/dL (ref 30.0–36.0)
MCV: 95 fL (ref 78.0–100.0)
Platelets: 219 10*3/uL (ref 150–400)
RBC: 4.16 MIL/uL (ref 3.87–5.11)
RDW: 15.6 % — ABNORMAL HIGH (ref 11.5–15.5)
WBC: 24.8 10*3/uL — AB (ref 4.0–10.5)

## 2017-11-10 LAB — BLOOD GAS, ARTERIAL
Acid-base deficit: 8.7 mmol/L — ABNORMAL HIGH (ref 0.0–2.0)
Bicarbonate: 17.7 mmol/L — ABNORMAL LOW (ref 20.0–28.0)
DELIVERY SYSTEMS: 500
DRAWN BY: 105551
FIO2: 55
MECHANICAL RATE: 22
MECHVT: 500 mL
O2 Saturation: 96 %
PEEP: 5 cmH2O
PO2 ART: 98.9 mmHg (ref 83.0–108.0)
RATE: 22 resp/min
pCO2 arterial: 31.2 mmHg — ABNORMAL LOW (ref 32.0–48.0)
pH, Arterial: 7.331 — ABNORMAL LOW (ref 7.350–7.450)

## 2017-11-10 LAB — TRIGLYCERIDES: Triglycerides: 414 mg/dL — ABNORMAL HIGH (ref <150)

## 2017-11-10 LAB — COMPREHENSIVE METABOLIC PANEL
ALBUMIN: 2 g/dL — AB (ref 3.5–5.0)
ALT: 41 U/L (ref 14–54)
AST: 53 U/L — AB (ref 15–41)
Alkaline Phosphatase: 102 U/L (ref 38–126)
Anion gap: 12 (ref 5–15)
BILIRUBIN TOTAL: 0.4 mg/dL (ref 0.3–1.2)
BUN: 71 mg/dL — AB (ref 6–20)
CO2: 16 mmol/L — ABNORMAL LOW (ref 22–32)
CREATININE: 0.83 mg/dL (ref 0.44–1.00)
Calcium: 8.6 mg/dL — ABNORMAL LOW (ref 8.9–10.3)
Chloride: 122 mmol/L — ABNORMAL HIGH (ref 101–111)
GFR calc Af Amer: >60 mL/min (ref >60)
GFR calc non Af Amer: >60 mL/min (ref >60)
GLUCOSE: 250 mg/dL — AB (ref 65–99)
Potassium: 3.8 mmol/L (ref 3.5–5.1)
Sodium: 150 mmol/L — ABNORMAL HIGH (ref 135–145)
TOTAL PROTEIN: 5.5 g/dL — AB (ref 6.5–8.1)

## 2017-11-10 LAB — GLUCOSE, CAPILLARY
GLUCOSE-CAPILLARY: 272 mg/dL — AB (ref 65–99)
GLUCOSE-CAPILLARY: 129 mg/dL — AB (ref 65–99)
Glucose-Capillary: 119 mg/dL — ABNORMAL HIGH (ref 65–99)
Glucose-Capillary: 133 mg/dL — ABNORMAL HIGH (ref 65–99)
Glucose-Capillary: 216 mg/dL — ABNORMAL HIGH (ref 65–99)
Glucose-Capillary: 234 mg/dL — ABNORMAL HIGH (ref 65–99)
Glucose-Capillary: 264 mg/dL — ABNORMAL HIGH (ref 65–99)

## 2017-11-10 LAB — COOXEMETRY PANEL
CARBOXYHEMOGLOBIN: 0.8 % (ref 0.5–1.5)
METHEMOGLOBIN: 1.9 % — AB (ref 0.0–1.5)
O2 SAT: 79.8 %
TOTAL HEMOGLOBIN: 12.5 g/dL (ref 12.0–16.0)
TOTAL OXYGEN CONTENT: 13.6 mL/dL — AB (ref 15.0–23.0)

## 2017-11-10 LAB — HIV ANTIBODY (ROUTINE TESTING W REFLEX): HIV Screen 4th Generation wRfx: NONREACTIVE

## 2017-11-10 MED ORDER — INSULIN ASPART 100 UNIT/ML ~~LOC~~ SOLN
0.0000 [IU] | SUBCUTANEOUS | Status: DC
  Administered 2017-11-10 (×2): 3 [IU] via SUBCUTANEOUS
  Administered 2017-11-10: 7 [IU] via SUBCUTANEOUS
  Administered 2017-11-11: 3 [IU] via SUBCUTANEOUS
  Administered 2017-11-11 (×2): 4 [IU] via SUBCUTANEOUS
  Administered 2017-11-12 (×3): 3 [IU] via SUBCUTANEOUS
  Administered 2017-11-12: 4 [IU] via SUBCUTANEOUS
  Administered 2017-11-13 (×2): 3 [IU] via SUBCUTANEOUS
  Administered 2017-11-14 (×2): 4 [IU] via SUBCUTANEOUS
  Administered 2017-11-15: 7 [IU] via SUBCUTANEOUS
  Administered 2017-11-15: 15 [IU] via SUBCUTANEOUS
  Administered 2017-11-15: 7 [IU] via SUBCUTANEOUS
  Administered 2017-11-15: 4 [IU] via SUBCUTANEOUS
  Administered 2017-11-15: 11 [IU] via SUBCUTANEOUS
  Administered 2017-11-16: 4 [IU] via SUBCUTANEOUS
  Administered 2017-11-16: 11 [IU] via SUBCUTANEOUS
  Administered 2017-11-16: 3 [IU] via SUBCUTANEOUS
  Administered 2017-11-16: 15 [IU] via SUBCUTANEOUS
  Administered 2017-11-17: 11 [IU] via SUBCUTANEOUS
  Administered 2017-11-17: 4 [IU] via SUBCUTANEOUS
  Administered 2017-11-17: 11 [IU] via SUBCUTANEOUS
  Administered 2017-11-17: 4 [IU] via SUBCUTANEOUS
  Administered 2017-11-17: 7 [IU] via SUBCUTANEOUS
  Administered 2017-11-17: 4 [IU] via SUBCUTANEOUS
  Administered 2017-11-18: 11 [IU] via SUBCUTANEOUS
  Administered 2017-11-18: 15 [IU] via SUBCUTANEOUS
  Administered 2017-11-18: 20 [IU] via SUBCUTANEOUS

## 2017-11-10 MED ORDER — FREE WATER
300.0000 mL | Status: AC
  Administered 2017-11-10 – 2017-11-11 (×6): 300 mL

## 2017-11-10 MED ORDER — INSULIN GLARGINE 100 UNIT/ML ~~LOC~~ SOLN
90.0000 [IU] | Freq: Every day | SUBCUTANEOUS | Status: DC
  Administered 2017-11-10 – 2017-11-13 (×4): 90 [IU] via SUBCUTANEOUS
  Filled 2017-11-10 (×8): qty 0.9

## 2017-11-10 MED ORDER — INSULIN ASPART 100 UNIT/ML ~~LOC~~ SOLN
14.0000 [IU] | SUBCUTANEOUS | Status: DC
  Administered 2017-11-10 (×2): 14 [IU] via SUBCUTANEOUS

## 2017-11-10 MED ORDER — INSULIN ASPART 100 UNIT/ML ~~LOC~~ SOLN
16.0000 [IU] | SUBCUTANEOUS | Status: DC
  Administered 2017-11-10 – 2017-11-16 (×15): 16 [IU] via SUBCUTANEOUS

## 2017-11-10 NOTE — Progress Notes (Signed)
Subjective: She is overall about the same.  She has had some episodes of being tachypneic and that may be related to pain.  She does have chronic pain at baseline.  She has remained on the ventilator.  She is on 55% oxygen but her blood gas looks so I think she is probably at a point that we could cut her oxygen back again.  Objective: Vital signs in last 24 hours: Temp:  [98.9 F (37.2 C)-100.1 F (37.8 C)] 99.9 F (37.7 C) (02/18 0400) Pulse Rate:  [39-124] 93 (02/18 0645) Resp:  [19-38] 23 (02/18 0645) BP: (92-147)/(56-97) 101/66 (02/18 0645) SpO2:  [88 %-96 %] 94 % (02/18 0645) FiO2 (%):  [55 %] 55 % (02/18 0400) Weight:  [94.7 kg (208 lb 12.4 oz)] 94.7 kg (208 lb 12.4 oz) (02/18 0500) Weight change: 1.3 kg (2 lb 13.9 oz) Last BM Date: 11/09/17  Intake/Output from previous day: 02/17 0701 - 02/18 0700 In: 3408.9 [I.V.:1799.5; NG/GT:1409.3; IV Piggyback:200] Out: 2200 [Urine:2200]  PHYSICAL EXAM General appearance: Intubated sedated on mechanical ventilation Resp: rhonchi bilaterally Cardio: regular rate and rhythm, S1, S2 normal, no murmur, click, rub or gallop GI: soft, non-tender; bowel sounds normal; no masses,  no organomegaly Extremities: extremities normal, atraumatic, no cyanosis or edema Skin warm and dry  Lab Results:  Results for orders placed or performed during the hospital encounter of 10/26/2017 (from the past 48 hour(s))  Glucose, capillary     Status: Abnormal   Collection Time: 11/08/17  7:35 AM  Result Value Ref Range   Glucose-Capillary 172 (H) 65 - 99 mg/dL  Lactic acid, plasma     Status: None   Collection Time: 11/08/17  8:37 AM  Result Value Ref Range   Lactic Acid, Venous 1.7 0.5 - 1.9 mmol/L    Comment: Performed at [facility], [address], [place], [place] [address]  Glucose, capillary     Status: Abnormal   Collection Time: 11/08/17  8:41 AM  Result Value Ref Range   Glucose-Capillary 164 (H) 65 - 99 mg/dL  Glucose, capillary     Status:  Abnormal   Collection Time: 11/08/17  9:40 AM  Result Value Ref Range   Glucose-Capillary 157 (H) 65 - 99 mg/dL  .Cooxemetry Panel (carboxy, met, total hgb, O2 sat)     Status: None   Collection Time: 11/08/17 10:25 AM  Result Value Ref Range   Total hemoglobin 12.9 12.0 - 16.0 g/dL   O2 Saturation 84.6 %   Carboxyhemoglobin 0.8 0.5 - 1.5 %   Methemoglobin 1.2 0.0 - 1.5 %   Total oxygen content 15.0 15.0 - 23.0 mL/dL    Comment: Performed at [facility], [address], [place], [place] [address]  Glucose, capillary     Status: Abnormal   Collection Time: 11/08/17 10:44 AM  Result Value Ref Range   Glucose-Capillary 152 (H) 65 - 99 mg/dL  Glucose, capillary     Status: Abnormal   Collection Time: 11/08/17 11:47 AM  Result Value Ref Range   Glucose-Capillary 149 (H) 65 - 99 mg/dL  Glucose, capillary     Status: Abnormal   Collection Time: 11/08/17 12:55 PM  Result Value Ref Range   Glucose-Capillary 190 (H) 65 - 99 mg/dL  Glucose, capillary     Status: Abnormal   Collection Time: 11/08/17  1:49 PM  Result Value Ref Range   Glucose-Capillary 175 (H) 65 - 99 mg/dL  .Cooxemetry Panel (carboxy, met, total hgb, O2 sat)  Status: None   Collection Time: 11/08/17  2:00 PM  Result Value Ref Range   Total hemoglobin 12.8 12.0 - 16.0 g/dL   O2 Saturation 96.8 %   Carboxyhemoglobin 0.9 0.5 - 1.5 %   Methemoglobin 1.2 0.0 - 1.5 %   Total oxygen content 17.2 15.0 - 23.0 mL/dL    Comment: Performed at [facility], [address], [place], [place] [address]  Glucose, capillary     Status: Abnormal   Collection Time: 11/08/17  3:00 PM  Result Value Ref Range   Glucose-Capillary 149 (H) 65 - 99 mg/dL  Glucose, capillary     Status: Abnormal   Collection Time: 11/08/17  4:10 PM  Result Value Ref Range   Glucose-Capillary 153 (H) 65 - 99 mg/dL  Glucose, capillary     Status: Abnormal   Collection Time: 11/08/17  5:16 PM  Result Value Ref Range   Glucose-Capillary 149 (H) 65 - 99 mg/dL   Glucose, capillary     Status: Abnormal   Collection Time: 11/08/17  6:12 PM  Result Value Ref Range   Glucose-Capillary 173 (H) 65 - 99 mg/dL  .Cooxemetry Panel (carboxy, met, total hgb, O2 sat)     Status: Abnormal   Collection Time: 11/08/17  6:30 PM  Result Value Ref Range   Total hemoglobin 13.1 12.0 - 16.0 g/dL   O2 Saturation 77.2 %   Carboxyhemoglobin 0.9 0.5 - 1.5 %   Methemoglobin 1.2 0.0 - 1.5 %   Total oxygen content 13.9 (L) 15.0 - 23.0 mL/dL    Comment: Performed at [facility], [address], [place], [place] [address]  Glucose, capillary     Status: Abnormal   Collection Time: 11/08/17  7:19 PM  Result Value Ref Range   Glucose-Capillary 170 (H) 65 - 99 mg/dL   Comment 1 Notify RN   Glucose, capillary     Status: Abnormal   Collection Time: 11/08/17  8:40 PM  Result Value Ref Range   Glucose-Capillary 157 (H) 65 - 99 mg/dL   Comment 1 Notify RN   Glucose, capillary     Status: Abnormal   Collection Time: 11/08/17  9:29 PM  Result Value Ref Range   Glucose-Capillary 133 (H) 65 - 99 mg/dL   Comment 1 Notify RN   Glucose, capillary     Status: Abnormal   Collection Time: 11/08/17 10:38 PM  Result Value Ref Range   Glucose-Capillary 139 (H) 65 - 99 mg/dL   Comment 1 Notify RN   Glucose, capillary     Status: Abnormal   Collection Time: 11/08/17 11:32 PM  Result Value Ref Range   Glucose-Capillary 181 (H) 65 - 99 mg/dL   Comment 1 Notify RN   Glucose, capillary     Status: Abnormal   Collection Time: 11/09/17 12:37 AM  Result Value Ref Range   Glucose-Capillary 172 (H) 65 - 99 mg/dL   Comment 1 Notify RN   Glucose, capillary     Status: Abnormal   Collection Time: 11/09/17  1:32 AM  Result Value Ref Range   Glucose-Capillary 177 (H) 65 - 99 mg/dL   Comment 1 Notify RN   Glucose, capillary     Status: Abnormal   Collection Time: 11/09/17  2:36 AM  Result Value Ref Range   Glucose-Capillary 189 (H) 65 - 99 mg/dL   Comment 1 Notify RN   Glucose,  capillary     Status: Abnormal   Collection Time: 11/09/17  3:49 AM  Result Value Ref Range   Glucose-Capillary 167 (H) 65 - 99 mg/dL   Comment 1 Notify RN   Draw ABG 1 hour after initiation of ventilator     Status: Abnormal   Collection Time: 11/09/17  4:30 AM  Result Value Ref Range   FIO2 55.00    Delivery systems VENTILATOR    Mode ASSIST CONTROL    VT 500 mL   LHR 22 resp/min   Peep/cpap 5.0 cm H20   pH, Arterial 7.381 7.350 - 7.450   pCO2 arterial 30.0 (L) 32.0 - 48.0 mmHg   pO2, Arterial 83.6 83.0 - 108.0 mmHg   Bicarbonate 19.3 (L) 20.0 - 28.0 mmol/L   Acid-base deficit 6.7 (H) 0.0 - 2.0 mmol/L   O2 Saturation 95.1 %   Collection site RADIAL    Drawn by [id number]    Sample type ARTERIAL    Allens test (pass/fail) PASS PASS   Mechanical Rate 22     Comment: Performed at [facility], [address], [place], [place] [address]  Glucose, capillary     Status: Abnormal   Collection Time: 11/09/17  4:45 AM  Result Value Ref Range   Glucose-Capillary 156 (H) 65 - 99 mg/dL   Comment 1 Notify RN   Glucose, capillary     Status: Abnormal   Collection Time: 11/09/17  5:32 AM  Result Value Ref Range   Glucose-Capillary 159 (H) 65 - 99 mg/dL   Comment 1 Notify RN   CBC     Status: Abnormal   Collection Time: 11/09/17  6:00 AM  Result Value Ref Range   WBC 25.5 (H) 4.0 - 10.5 K/uL    Comment: WHITE COUNT CONFIRMED ON SMEAR   RBC 4.04 3.87 - 5.11 MIL/uL   Hemoglobin 12.3 12.0 - 15.0 g/dL   HCT 37.4 36.0 - 46.0 %   MCV 92.6 78.0 - 100.0 fL   MCH 30.4 26.0 - 34.0 pg   MCHC 32.9 30.0 - 36.0 g/dL   RDW 14.9 11.5 - 15.5 %   Platelets 199 150 - 400 K/uL    Comment: Performed at [facility], [address], [place], [place] [address]  Comprehensive metabolic panel     Status: Abnormal   Collection Time: 11/09/17  6:00 AM  Result Value Ref Range   Sodium 146 (H) 135 - 145 mmol/L   Potassium 3.8 3.5 - 5.1 mmol/L   Chloride 120 (H) 101 - 111 mmol/L   CO2 17 (L) 22 - 32 mmol/L    Glucose, Bld 174 (H) 65 - 99 mg/dL   BUN 62 (H) 6 - 20 mg/dL   Creatinine, Ser 0.87 0.44 - 1.00 mg/dL   Calcium 8.5 (L) 8.9 - 10.3 mg/dL   Total Protein 5.9 (L) 6.5 - 8.1 g/dL   Albumin 2.1 (L) 3.5 - 5.0 g/dL   AST 34 15 - 41 U/L   ALT 27 14 - 54 U/L   Alkaline Phosphatase 64 38 - 126 U/L   Total Bilirubin 0.7 0.3 - 1.2 mg/dL   GFR calc non Af Amer >60 >60 mL/min   GFR calc Af Amer >60 >60 mL/min    Comment: (NOTE) The eGFR has been calculated using the CKD EPI equation. This calculation has not been validated in all clinical situations. eGFR's persistently <60 mL/min signify possible Chronic Kidney Disease.    Anion gap 9 5 - 15    Comment: Performed at [facility], [address] [place], [place] [address]  Glucose, capillary     Status: Abnormal   Collection Time: 11/09/17  6:28 AM  Result Value Ref Range   Glucose-Capillary 157 (H) 65 - 99 mg/dL   Comment 1 Notify RN   Glucose, capillary     Status: Abnormal   Collection Time: 11/09/17  7:36 AM  Result Value Ref Range   Glucose-Capillary 181 (H) 65 - 99 mg/dL  Glucose, capillary     Status: Abnormal   Collection Time: 11/09/17  8:34 AM  Result Value Ref Range   Glucose-Capillary 160 (H) 65 - 99 mg/dL  Glucose, capillary     Status: Abnormal   Collection Time: 11/09/17  9:57 AM  Result Value Ref Range   Glucose-Capillary 167 (H) 65 - 99 mg/dL  Glucose, capillary     Status: Abnormal   Collection Time: 11/09/17 11:27 AM  Result Value Ref Range   Glucose-Capillary 205 (H) 65 - 99 mg/dL  Glucose, capillary     Status: Abnormal   Collection Time: 11/09/17  1:07 PM  Result Value Ref Range   Glucose-Capillary 161 (H) 65 - 99 mg/dL  Glucose, capillary     Status: Abnormal   Collection Time: 11/09/17  2:14 PM  Result Value Ref Range   Glucose-Capillary 140 (H) 65 - 99 mg/dL  Glucose, capillary     Status: Abnormal   Collection Time: 11/09/17  4:10 PM  Result Value Ref Range   Glucose-Capillary 208 (H) 65 - 99 mg/dL   Glucose, capillary     Status: Abnormal   Collection Time: 11/09/17  7:43 PM  Result Value Ref Range   Glucose-Capillary 272 (H) 65 - 99 mg/dL   Comment 1 Notify RN   Glucose, capillary     Status: Abnormal   Collection Time: 11/09/17 11:59 PM  Result Value Ref Range   Glucose-Capillary 264 (H) 65 - 99 mg/dL   Comment 1 Notify RN    Comment 2 Document in Chart   Glucose, capillary     Status: Abnormal   Collection Time: 11/10/17  4:07 AM  Result Value Ref Range   Glucose-Capillary 272 (H) 65 - 99 mg/dL   Comment 1 Notify RN   Draw ABG 1 hour after initiation of ventilator     Status: Abnormal   Collection Time: 11/10/17  4:20 AM  Result Value Ref Range   FIO2 55.00    Delivery systems 500    Mode ASSIST CONTROL    VT 500 mL   LHR 22 resp/min   Peep/cpap 5.0 cm H20   pH, Arterial 7.331 (L) 7.350 - 7.450   pCO2 arterial 31.2 (L) 32.0 - 48.0 mmHg   pO2, Arterial 98.9 83.0 - 108.0 mmHg   Bicarbonate 17.7 (L) 20.0 - 28.0 mmol/L   Acid-base deficit 8.7 (H) 0.0 - 2.0 mmol/L   O2 Saturation 96.0 %   Collection site RADIAL    Drawn by [id number]    Sample type ARTERIAL    Allens test (pass/fail) PASS PASS   Mechanical Rate 22     Comment: Performed at [facility], [address] [place], [place] [address]  CBC     Status: Abnormal   Collection Time: 11/10/17  5:33 AM  Result Value Ref Range   WBC 24.8 (H) 4.0 - 10.5 K/uL   RBC 4.16 3.87 - 5.11 MIL/uL   Hemoglobin 12.5 12.0 - 15.0 g/dL   HCT 39.5 36.0 - 46.0 %   MCV 95.0 78.0 - 100.0 fL   MCH  30.0 26.0 - 34.0 pg   MCHC 31.6 30.0 - 36.0 g/dL   RDW 15.6 (H) 11.5 - 15.5 %   Platelets 219 150 - 400 K/uL    Comment: Performed at [facility], [address], [place], [place] [address]    ABGS Recent Labs    11/10/17 0420  PHART 7.331*  PO2ART 98.9  HCO3 17.7*   CULTURES Recent Results (from the past 240 hour(s))  Blood Culture (routine x 2)     Status: None   Collection Time: 10/24/2017  6:27 PM  Result Value Ref Range  Status   Specimen Description RIGHT ANTECUBITAL  Final   Special Requests   Final    BOTTLES DRAWN AEROBIC AND ANAEROBIC Blood Culture adequate volume   Culture   Final    NO GROWTH 5 DAYS Performed at [facility], [address], [place], [place] [address]    Report Status 11/08/2017 FINAL  Final  Blood Culture (routine x 2)     Status: None   Collection Time: 11/01/2017  6:27 PM  Result Value Ref Range Status   Specimen Description RIGHT ANTECUBITAL  Final   Special Requests   Final    BOTTLES DRAWN AEROBIC AND ANAEROBIC Blood Culture adequate volume   Culture   Final    NO GROWTH 5 DAYS Performed at [facility], [address] [place], [place] [address]    Report Status 11/08/2017 FINAL  Final  MRSA PCR Screening     Status: None   Collection Time: 11/04/17  1:26 AM  Result Value Ref Range Status   MRSA by PCR NEGATIVE NEGATIVE Final    Comment:        The GeneXpert MRSA Assay (FDA approved for NASAL specimens only), is one component of a comprehensive MRSA colonization surveillance program. It is not intended to diagnose MRSA infection nor to guide or monitor treatment for MRSA infections. Performed at [facility], [address] [place], [place] [address]    Studies/Results: Dg Chest 1 View  Result Date: 11/09/2017 CLINICAL DATA:  Respiratory failure EXAM: CHEST 1 VIEW COMPARISON:  11/08/2017 FINDINGS: Stable endotracheal tube, NG tube, and right PICC line positions. Left subclavian 2 lead pacer noted. Cardiomegaly remains with diffuse airspace process versus edema. No enlarging effusion or pneumothorax. No significant interval change in aeration. IMPRESSION: Stable cardiomegaly with diffuse airspace process versus edema. Electronically Signed   By: Jerilynn Mages.  Shick M.D.   On: 11/09/2017 08:12   Dg Chest Port 1 View  Result Date: 11/09/2017 CLINICAL DATA:  Respiratory failure. EXAM: PORTABLE CHEST 1 VIEW COMPARISON:  11/09/2017 FINDINGS: Left-sided pacemaker unchanged.  Endotracheal tube unchanged. Enteric tube courses into the region of the stomach and off the inferior portion of the film. Right subclavian central venous catheter has tip over the SVC unchanged. Lungs are adequately inflated and demonstrate mild prominence of the perihilar markings suggesting a degree of vascular congestion. No evidence of effusion. Remainder the exam is unchanged. IMPRESSION: Suggestion of mild vascular congestion. Tubes and lines as described. Electronically Signed   By: Marin Olp M.D.   On: 11/09/2017 13:59    Medications:  Prior to Admission:  Medications Prior to Admission  Medication Sig Dispense Refill Last Dose  . acetaminophen (TYLENOL) 500 MG tablet Take 500 mg by mouth every 6 (six) hours as needed for mild pain or moderate pain.   11/02/2017 at Unknown time  . aspirin EC 81 MG tablet Take 81 mg by mouth daily.   11/20/2017  at Unknown time  . carvedilol (COREG) 6.25 MG tablet Take 1 tablet (6.25 mg total) by mouth 2 (two) times daily. 180 tablet 3 10/26/2017 at Unknown time  . Cholecalciferol (VITAMIN D3) 5000 units CAPS Take 1 capsule (5,000 Units total) by mouth daily. 90 capsule 0 11/01/2017 at Unknown time  . clopidogrel (PLAVIX) 75 MG tablet Take 75 mg by mouth daily.   11/06/2017 at Unknown time  . diclofenac sodium (VOLTAREN) 1 % GEL Apply 4 g topically 4 (four) times daily. (Patient taking differently: Apply 2-4 g topically daily as needed (for leg pain). ) 200 g 11 Past Month at Unknown time  . dicyclomine (BENTYL) 20 MG tablet Take 1 tablet (20 mg total) by mouth every 8 (eight) hours as needed for spasms. 90 tablet 3 11/08/2017 at Unknown time  . diphenoxylate-atropine (LOMOTIL) 2.5-0.025 MG tablet Take 2 tabs by mouth x 1, then 1 tab every 4 hrs PRN, max of 8 tabs in 24 hour time 240 tablet 1 Past Week at Unknown time  . DULoxetine (CYMBALTA) 30 MG capsule Take 90 mg daily (60 mg + 30 mg) (Patient taking differently: Take 30 mg by mouth daily. ) 30 capsule 0  11/19/2017 at Unknown time  . DULoxetine (CYMBALTA) 60 MG capsule Take 90 mg daily (60 mg + 30 mg) 30 capsule 0 10/25/2017 at Unknown time  . furosemide (LASIX) 40 MG tablet TAKE (1) TABLET BY MOUTH EACH MORNING. 90 tablet 3 11/15/2017 at Unknown time  . ibuprofen (ADVIL,MOTRIN) 600 MG tablet Take 600 mg by mouth 2 (two) times daily as needed for moderate pain.    11/08/2017 at Unknown time  . insulin aspart (NOVOLOG FLEXPEN) 100 UNIT/ML FlexPen Inject 15-21 Units into the skin 3 (three) times daily with meals. 5 pen 2 11/15/2017 at Unknown time  . INVOKANA 100 MG TABS tablet TAKE 1 TABLET BY MOUTH DAILY BEFORE BREAKFAST. 30 tablet 2 11/19/2017 at Unknown time  . LEVEMIR FLEXTOUCH 100 UNIT/ML Pen INJECT 80 UNITS UNDER THE SKIN EVERY DAY AT 10 PM 30 mL 2 11/02/2017 at Unknown time  . lisinopril (PRINIVIL,ZESTRIL) 5 MG tablet Take 5 mg by mouth daily.   11/04/2017 at Unknown time  . LORazepam (ATIVAN) 0.5 MG tablet Take 1 tablet (0.5 mg total) by mouth daily as needed for anxiety. 30 tablet 0 Past Week at Unknown time  . LYRICA 150 MG capsule take 167m by mouth three times daily  0 11/04/2017 at Unknown time  . meloxicam (MOBIC) 15 MG tablet Take 15 mg by mouth daily.   11/15/2017 at Unknown time  . metFORMIN (GLUCOPHAGE) 1000 MG tablet TAKE 1 TABLET(1000 MG) BY MOUTH TWICE DAILY WITH A MEAL 60 tablet 3 11/08/2017 at Unknown time  . nitroGLYCERIN (NITROSTAT) 0.4 MG SL tablet Place 1 tablet (0.4 mg total) under the tongue every 5 (five) minutes as needed for chest pain. 30 tablet 0 unknown  . pantoprazole (PROTONIX) 40 MG tablet Take 1 tablet (40 mg total) by mouth 2 (two) times daily. 60 tablet 0 11/06/2017 at Unknown time  . potassium chloride (K-DUR) 10 MEQ tablet Take 1 tablet (10 mEq total) by mouth daily. 90 tablet 3 11/08/2017 at Unknown time  . pramipexole (MIRAPEX) 0.25 MG tablet Take 0.25 mg by mouth 2 (two) times daily.    11/11/2017 at Unknown time  . simvastatin (ZOCOR) 10 MG tablet TAKE (1) TABLET BY  MOUTH DAILY. (Patient taking differently: TAKE (2) TABLET BY MOUTH DAILY.) 90 tablet 2 11/13/2017 at Unknown  time  . tapentadol (NUCYNTA ER) 100 MG 12 hr tablet Take 100 mg by mouth every 12 (twelve) hours.   11/02/2017 at Unknown time  . tiZANidine (ZANAFLEX) 4 MG capsule Take 4 mg by mouth 3 (three) times daily.   11/05/2017 at Unknown time  . albuterol (PROVENTIL) (2.5 MG/3ML) 0.083% nebulizer solution Take 3 mLs (2.5 mg total) by nebulization every 6 (six) hours as needed for wheezing or shortness of breath. 75 mL 12 unknown  . colestipol (COLESTID) 1 g tablet Take 1 tablet (1 g total) by mouth 2 (two) times daily. (Patient not taking: Reported on 10/24/2017) 90 tablet 3 unknown  . inFLIXimab 500 mg in sodium chloride 0.9 % 200 mL Inject 500 mg into the vein every 8 (eight) weeks. 1 Dose 6 Taking  . OXYGEN Inhale 3 L/day into the lungs at bedtime.   Taking   Scheduled: . aspirin EC  81 mg Oral Daily  . atorvastatin  80 mg Oral q1800  . budesonide (PULMICORT) nebulizer solution  0.5 mg Nebulization BID  . chlorhexidine gluconate (MEDLINE KIT)  15 mL Mouth Rinse BID  . Chlorhexidine Gluconate Cloth  6 each Topical Daily  . clopidogrel  75 mg Oral Daily  . feeding supplement (PRO-STAT SUGAR FREE 64)  60 mL Per Tube TID  . feeding supplement (VITAL HIGH PROTEIN)  1,000 mL Per Tube Q24H  . heparin injection (subcutaneous)  5,000 Units Subcutaneous Q8H  . insulin aspart  14 Units Subcutaneous Q4H  . insulin glargine  90 Units Subcutaneous Daily  . ipratropium  0.5 mg Nebulization Q6H  . levalbuterol  0.63 mg Nebulization Q6H  . mouth rinse  15 mL Mouth Rinse 10 times per day  . methylPREDNISolone (SOLU-MEDROL) injection  40 mg Intravenous Q8H  . pantoprazole (PROTONIX) IV  40 mg Intravenous QHS  . potassium chloride  10 mEq Oral Daily  . sodium chloride flush  10-40 mL Intracatheter Q12H  . tapentadol  100 mg Oral Q12H   Continuous: . ceFEPime (MAXIPIME) IV Stopped (11/10/17 0229)  .  propofol (DIPRIVAN) infusion 55 mcg/kg/min (11/10/17 0556)   YQI:[id number] **OR** acetaminophen, fentaNYL (SUBLIMAZE) injection, fentaNYL (SUBLIMAZE) injection, levalbuterol, LORazepam, sodium chloride flush  Assesment: She was admitted with influenza A COPD exacerbation diffuse bilateral pneumonia and acute hypoxic respiratory failure.  She is still on the ventilator.  Chest x-ray today is pending.  She is on 55% oxygen so we do not have a opportunity to do any weaning now.  She has acute systolic heart failure with substantial decrease in her ejection fraction from about a month ago.  Her blood pressures have been soft and she has had acute kidney injury so we have not been diuresing.  Her CVP has been around 10 and her venous oxygen saturation has been around 70 over the weekend.  She has a PICC line in her right arm.  She has COPD exacerbation she is being treated with steroids and bronchodilators  She has diabetes and developed diabetic ketoacidosis.  Her blood sugars are in the 200s now  She has had troponin elevation but does not appear to have had acute coronary syndrome.  She has chronic pain at baseline and some of her episodes of tachypnea may be related to that   Active Problems:   DM type 2 causing vascular disease ([place])   Current smoker   ESOPHAGEAL STRICTURE   Esophageal reflux   Gastroparesis   Cardiac pacemaker in situ   COPD GOLD 0 / still  smoking    OSA (obstructive sleep apnea)   Diabetic neuropathy (HCC)   Dysphagia   Crohn's disease without complication (HCC)   COPD exacerbation (HCC)   Influenza A   Acute respiratory failure with hypoxia (HCC)   Hyponatremia   Hypokalemia    Plan: Continue treatments check chest x-ray when available    LOS: 7 days   Irelynd Zumstein L 11/10/2017, 7:09 AM

## 2017-11-10 NOTE — Progress Notes (Addendum)
Nutrition Follow-up  DOCUMENTATION CODES:   Obesity unspecified  INTERVENTION:  Vital @ 10 ml/hr and Prostat 60 ml TID- provides: 840 kcal (937 kcal diprivan), 111 gr protein and 201 ml fluid.   Free water 300 ml q 4 hrs- 1800 ml daily   RD will continue to follow  NUTRITION DIAGNOSIS:   Inadequate oral intake related to inability to eat as evidenced by NPO status.  GOAL:   Provide needs based on ASPEN/SCCM guidelines  MONITOR:   Diet advancement, Vent status, Labs, Weight trends, TF tolerance  REASON FOR ASSESSMENT: review adequacy of free water  ASSESSMENT:  Patient is a 52 yo intubated since 2/12. Acute respiratory failure, PNA, COPD exacerbation, DKA, Influenza A, GERD,  Sepsis.  Propofol increased to 35.5 ml/hr (937 kcal every 24 hr). Lipids trending up (414). MD aware and monitoring. Free water added 300 ml q 4hr. Tube feeding and free water providing ~ 2 liters daily and expected adequate at this time to meet needs. Continue to monitor sodium trend. WBC 24.8, Alb 2.0.   BMP Latest Ref Rng & Units 11/10/2017 11/09/2017 11/08/2017  Glucose 65 - 99 mg/dL 093(G) 182(X) 937(J)  BUN 6 - 20 mg/dL 69(C) 78(L) 38(B)  Creatinine 0.44 - 1.00 mg/dL 0.17 5.10 2.58  BUN/Creat Ratio 9 - 23 - - -  Sodium 135 - 145 mmol/L 150(H) 146(H) 141  Potassium 3.5 - 5.1 mmol/L 3.8 3.8 3.6  Chloride 101 - 111 mmol/L 122(H) 120(H) 116(H)  CO2 22 - 32 mmol/L 16(L) 17(L) 16(L)  Calcium 8.9 - 10.3 mg/dL 5.2(D) 7.8(E) 4.2(P)     Diet Order:  Diet NPO time specified  EDUCATION NEEDS:   No education needs have been identified at this time  Skin:  Skin Assessment: Reviewed RN Assessment  Last BM:  2/17  Height:   Ht Readings from Last 1 Encounters:  11/10/17 5\' 4"  (1.626 m)    Weight:   Wt Readings from Last 1 Encounters:  11/10/17 208 lb 12.4 oz (94.7 kg)    Ideal Body Weight:  54.54 kg  BMI:  Body mass index is 35.84 kg/m.  Estimated Nutritional Needs:   Kcal:  1000-1275  kcals (11-14 kcal/kg bw)  Protein:  >109 g Pro (2g/kg ibw)  Fluid:  Per MD goals   Royann Shivers MS,RD,CSG,LDN Office: [phone] Pager: [phone]

## 2017-11-10 NOTE — Progress Notes (Signed)
[facility] PROGRESS NOTE  [name] WPY:[id number] DOB: [birth year] DOA: 11/14/2017 PCP: [facility] [place]  Interim summary and HPI 52 y.o. female,  w Copd on home O2, apparently ran out of her breathing treatments and c/o increase in dyspnea. Pt states that her breathing started to become worse on Friday.  + cough w yellow sputum.  Denies fever, chills, cp, palpitations, brbpr.   Pt notes n/v, general malaise and diarrhea over the past 2 days.   In ED, pox on RA was in the mid to high 80's; found to have positive influenza A. CXR also demonstrated coarse interstitial marking and vascular congestion; unable to r/o underlying infiltrates. WBC's 12.6, fever 102.6, RR 25  Of, note patient was also found to be on DKA and 2-D Echo demonstrated EF of 30% with concerns for Takotsubo, cardiology following.   Assessment/Plan: 1-acute resp failure with hypoxia: in the setting of COPD exacerbation and influenza A and pneumonia -continue ventilatory support -appreciate assistance by pulmonary/critical care service -continue steroids, Duoneb and antibiotics -continue pulmicort BID and atrovent QID -pulmonary will try weaning vent today -continue current antibiotics for pneumonia coverage.  2-DKA: -in the setting of steroid use and infection and possibly invokana (currently being held) -Pt taken off IV insulin today and transitioned to lantus and novolog, add SSI coverage as well.      -follow CBGs -follow BMP  3-acute Cardiomyopathy: concern for takotsubo; positive apical wall motion abnormalities and decrease EF; no prior hx of CAD or heart failure.  -cardiology consulted and working patient up -PICC line to check CVP and COX -diuretics BEING HELD per cardiology service -given low BP now, will avoid the use of B-blocker, ARB/ACEI -daily weights and strict intake and output  Filed Weights   11/08/17 0445 11/09/17 0500 11/10/17 0500  Weight: 97.6 kg (215 lb 2.7 oz)  93.4 kg (205 lb 14.6 oz) 94.7 kg (208 lb 12.4 oz)   4-influenza A - treated -will continue providing supportive care and completed course of tamiflu  5-GERD/GI prophylaxis -continue PPI  6-Sepsis: clinically improving  -code sepsis initiated -continue cefepime x 8 days  7. Elevated troponin / ACS - pt was treated with 48 hours of IV heparin infusion  8. Hypernatremia - add additional free water and follow.   Code Status: Full Family Communication: no family at bedside Disposition Plan: remains in ICU, continue ventilatory support.   Consultants:  Cardiology  Pulmonary/critical care  Procedures:  See below for x-ray reports   2-D echo: EF 30%, apical WMA  Antibiotics:  Cefepime, vancomycin and levaquin 2/12  HPI/Subjective: Currently afebrile and sedated on vent.    Objective: Vitals:   11/10/17 0630 11/10/17 0645  BP: 105/61 101/66  Pulse: 89 93  Resp: (!) 23 (!) 23  Temp:    SpO2: 93% 94%    Intake/Output Summary (Last 24 hours) at 11/10/2017 0652 Last data filed at 11/10/2017 0556 Gross per 24 hour  Intake 3408.86 ml  Output 2200 ml  Net 1208.86 ml   Filed Weights   11/08/17 0445 11/09/17 0500 11/10/17 0500  Weight: 97.6 kg (215 lb 2.7 oz) 93.4 kg (205 lb 14.6 oz) 94.7 kg (208 lb 12.4 oz)    Exam:   General:  Afebrile, sedated and ventilated.  Cardiovascular: normal S1 and S2, no rubs or gallops.   Respiratory: expiratory wheezes heard bilateral better air movement noted. Crackles heard bases.   Abdomen: obese, soft, NT, ND, positive BS.  Musculoskeletal: SCDs bilateral LEs, no  cyanosis, no clubbing   Data Reviewed: Basic Metabolic Panel: Recent Labs  Lab 11/05/17 0458 11/06/17 0513 11/06/17 0610 11/07/17 0556 11/08/17 0433 11/09/17 0600  NA 142 140  --  141 141 146*  K 3.4* 3.2*  --  3.3* 3.6 3.8  CL 109 110  --  114* 116* 120*  CO2 19* 18*  --  17* 16* 17*  GLUCOSE 158* 174*  --  146* 181* 174*  BUN 48* 51*  --  55* 57* 62*   CREATININE 1.48* 1.33*  --  1.10* 0.95 0.87  CALCIUM 8.6* 8.7*  --  8.7* 8.5* 8.5*  MG  --   --  1.9 2.2  --   --    Liver Function Tests: Recent Labs  Lab 11/14/2017 1752 11/04/17 0433 11/09/17 0600  AST 31 42* 34  ALT 27 25 27   ALKPHOS 68 67 64  BILITOT 0.9 1.2 0.7  PROT 7.4 7.3 5.9*  ALBUMIN 3.1* 3.0* 2.1*   Recent Labs  Lab 11/12/2017 1806  LIPASE 19   CBC: Recent Labs  Lab 10/24/2017 1752 11/04/17 0433 11/06/17 0513 11/07/17 0556 11/08/17 0433 11/09/17 0600  WBC 12.6* 11.4* 12.9* 16.6* 22.3* 25.5*  NEUTROABS 9.4*  --   --   --   --   --   HGB 16.1* 15.6* 12.6 12.0 12.6 12.3  HCT 48.1* 47.8* 38.4 37.1 38.2 37.4  MCV 92.9 95.0 92.5 93.9 93.9 92.6  PLT 123* 118* 156 185 201 199   Cardiac Enzymes: Recent Labs  Lab 11/05/17 0458 11/05/17 0909 11/05/17 1439 11/05/17 2036 11/06/17 0909  TROPONINI 1.26* 1.18* 1.30* 1.68* 1.47*   BNP (last 3 results) Recent Labs    09/20/17 2346 11/01/2017 1753 11/04/17 1619  BNP 227.0* 36.0 368.0*    CBG: Recent Labs  Lab 11/09/17 1414 11/09/17 1610 11/09/17 1943 11/09/17 2359 11/10/17 0407  GLUCAP 140* 208* 272* 264* 272*    Recent Results (from the past 240 hour(s))  Blood Culture (routine x 2)     Status: None   Collection Time: 11/20/2017  6:27 PM  Result Value Ref Range Status   Specimen Description RIGHT ANTECUBITAL  Final   Special Requests   Final    BOTTLES DRAWN AEROBIC AND ANAEROBIC Blood Culture adequate volume   Culture   Final    NO GROWTH 5 DAYS Performed at [facility], [address], [place], [place] [address]    Report Status 11/08/2017 FINAL  Final  Blood Culture (routine x 2)     Status: None   Collection Time: 11/09/2017  6:27 PM  Result Value Ref Range Status   Specimen Description RIGHT ANTECUBITAL  Final   Special Requests   Final    BOTTLES DRAWN AEROBIC AND ANAEROBIC Blood Culture adequate volume   Culture   Final    NO GROWTH 5 DAYS Performed at [facility], [address]  [place], [place] [address]    Report Status 11/08/2017 FINAL  Final  MRSA PCR Screening     Status: None   Collection Time: 11/04/17  1:26 AM  Result Value Ref Range Status   MRSA by PCR NEGATIVE NEGATIVE Final    Comment:        The GeneXpert MRSA Assay (FDA approved for NASAL specimens only), is one component of a comprehensive MRSA colonization surveillance program. It is not intended to diagnose MRSA infection nor to guide or monitor treatment for MRSA infections. Performed at [facility], [address], [place],  [place] [address]      Studies: Dg Chest 1 View  Result Date: 11/09/2017 CLINICAL DATA:  Respiratory failure EXAM: CHEST 1 VIEW COMPARISON:  11/08/2017 FINDINGS: Stable endotracheal tube, NG tube, and right PICC line positions. Left subclavian 2 lead pacer noted. Cardiomegaly remains with diffuse airspace process versus edema. No enlarging effusion or pneumothorax. No significant interval change in aeration. IMPRESSION: Stable cardiomegaly with diffuse airspace process versus edema. Electronically Signed   By: Jerilynn Mages.  Shick M.D.   On: 11/09/2017 08:12   Dg Chest Port 1 View  Result Date: 11/09/2017 CLINICAL DATA:  Respiratory failure. EXAM: PORTABLE CHEST 1 VIEW COMPARISON:  11/09/2017 FINDINGS: Left-sided pacemaker unchanged. Endotracheal tube unchanged. Enteric tube courses into the region of the stomach and off the inferior portion of the film. Right subclavian central venous catheter has tip over the SVC unchanged. Lungs are adequately inflated and demonstrate mild prominence of the perihilar markings suggesting a degree of vascular congestion. No evidence of effusion. Remainder the exam is unchanged. IMPRESSION: Suggestion of mild vascular congestion. Tubes and lines as described. Electronically Signed   By: Marin Olp M.D.   On: 11/09/2017 13:59   Scheduled Meds: . aspirin EC  81 mg Oral Daily  . atorvastatin  80 mg Oral q1800  . budesonide (PULMICORT) nebulizer solution   0.5 mg Nebulization BID  . chlorhexidine gluconate (MEDLINE KIT)  15 mL Mouth Rinse BID  . Chlorhexidine Gluconate Cloth  6 each Topical Daily  . clopidogrel  75 mg Oral Daily  . feeding supplement (PRO-STAT SUGAR FREE 64)  60 mL Per Tube TID  . feeding supplement (VITAL HIGH PROTEIN)  1,000 mL Per Tube Q24H  . heparin injection (subcutaneous)  5,000 Units Subcutaneous Q8H  . insulin aspart  14 Units Subcutaneous Q4H  . insulin glargine  90 Units Subcutaneous Daily  . ipratropium  0.5 mg Nebulization Q6H  . levalbuterol  0.63 mg Nebulization Q6H  . mouth rinse  15 mL Mouth Rinse 10 times per day  . methylPREDNISolone (SOLU-MEDROL) injection  40 mg Intravenous Q8H  . pantoprazole (PROTONIX) IV  40 mg Intravenous QHS  . potassium chloride  10 mEq Oral Daily  . sodium chloride flush  10-40 mL Intracatheter Q12H  . tapentadol  100 mg Oral Q12H   Continuous Infusions: . ceFEPime (MAXIPIME) IV Stopped (11/10/17 0229)  . propofol (DIPRIVAN) infusion 55 mcg/kg/min (11/10/17 0556)    Active Problems:   DM type 2 causing vascular disease ([place])   Current smoker   ESOPHAGEAL STRICTURE   Esophageal reflux   Gastroparesis   Cardiac pacemaker in situ   COPD GOLD 0 / still smoking    OSA (obstructive sleep apnea)   Diabetic neuropathy ([place])   Dysphagia   Crohn's disease without complication ([place])   COPD exacerbation ([place])   Influenza A   Acute respiratory failure with hypoxia ([place])   Hyponatremia   Hypokalemia  Critical Care Time spent: 32 minutes  [employer] [phone]. If 7PM-7AM, please contact night-coverage at [email or web address], password [facility] 11/10/2017, 6:52 AM  LOS: 7 days

## 2017-11-10 NOTE — Progress Notes (Signed)
Progress Note  Patient Name: CHRISTABEL CAMIRE Date of Encounter: 11/10/2017  Primary Cardiologist: No primary care provider on file.   Subjective   Patient intubated, sedated    Inpatient Medications    Scheduled Meds: . aspirin EC  81 mg Oral Daily  . atorvastatin  80 mg Oral q1800  . budesonide (PULMICORT) nebulizer solution  0.5 mg Nebulization BID  . chlorhexidine gluconate (MEDLINE KIT)  15 mL Mouth Rinse BID  . Chlorhexidine Gluconate Cloth  6 each Topical Daily  . clopidogrel  75 mg Oral Daily  . feeding supplement (PRO-STAT SUGAR FREE 64)  60 mL Per Tube TID  . feeding supplement (VITAL HIGH PROTEIN)  1,000 mL Per Tube Q24H  . free water  300 mL Per Tube Q4H  . heparin injection (subcutaneous)  5,000 Units Subcutaneous Q8H  . insulin aspart  0-20 Units Subcutaneous Q4H  . insulin aspart  14 Units Subcutaneous Q4H  . insulin glargine  90 Units Subcutaneous Daily  . ipratropium  0.5 mg Nebulization Q6H  . levalbuterol  0.63 mg Nebulization Q6H  . mouth rinse  15 mL Mouth Rinse 10 times per day  . methylPREDNISolone (SOLU-MEDROL) injection  40 mg Intravenous Q8H  . pantoprazole (PROTONIX) IV  40 mg Intravenous QHS  . potassium chloride  10 mEq Oral Daily  . sodium chloride flush  10-40 mL Intracatheter Q12H  . tapentadol  100 mg Oral Q12H   Continuous Infusions: . ceFEPime (MAXIPIME) IV Stopped (11/10/17 0930)  . propofol (DIPRIVAN) infusion 55 mcg/kg/min (11/10/17 1010)   Vital Signs    Vitals:   11/10/17 0700 11/10/17 0755 11/10/17 0758 11/10/17 0805  BP: 112/72     Pulse: 94     Resp: (!) 27     Temp:    99 F (37.2 C)  TempSrc:    Axillary  SpO2: 94% 96% 94% 92%  Weight:      Height:        Intake/Output Summary (Last 24 hours) at 11/10/2017 1046 Last data filed at 11/10/2017 1010 Gross per 24 hour  Intake 2224.73 ml  Output 2200 ml  Net 24.73 ml   Filed Weights   11/08/17 0445 11/09/17 0500 11/10/17 0500  Weight: 215 lb 2.7 oz (97.6 kg) 205 lb  14.6 oz (93.4 kg) 208 lb 12.4 oz (94.7 kg)    Telemetry    SR and pacing activity  - Personally Reviewed  ECG    n/a  Physical Exam   GEN: Pt intubated   / sedated  Neck: No definte  JVD  Difficutl to assess Cardiac: RRR, no murmurs, rubs, or gallops.  Respiratory: Rel clear GI: Soft    MS: No edema; No deformity.  Tattoes Neuro: Pt sedated   Labs    Chemistry Recent Labs  Lab 11/04/17 0433  11/08/17 0433 11/09/17 0600 11/10/17 0533  NA 135   < > 141 146* 150*  K 4.5   < > 3.6 3.8 3.8  CL 102   < > 116* 120* 122*  CO2 15*   < > 16* 17* 16*  GLUCOSE 453*   < > 181* 174* 250*  BUN 27*   < > 57* 62* 71*  CREATININE 0.95   < > 0.95 0.87 0.83  CALCIUM 8.2*   < > 8.5* 8.5* 8.6*  PROT 7.3  --   --  5.9* 5.5*  ALBUMIN 3.0*  --   --  2.1* 2.0*  AST 42*  --   --  34 53*  ALT 25  --   --  27 41  ALKPHOS 67  --   --  64 102  BILITOT 1.2  --   --  0.7 0.4  GFRNONAA >60   < > >60 >60 >60  GFRAA >60   < > >60 >60 >60  ANIONGAP 18*   < > _0 < > = values in this interval not displayed.     Hematology Recent Labs  Lab 11/08/17 0433 11/09/17 0600 11/10/17 0533  WBC 22.3* 25.5* 24.8*  RBC 4.07 4.04 4.16  HGB 12.6 12.3 12.5  HCT 38.2 37.4 39.5  MCV 93.9 92.6 95.0  MCH 31.0 30.4 30.0  MCHC 33.0 32.9 31.6  RDW 15.0 14.9 15.6*  PLT 201 199 219    Cardiac Enzymes Recent Labs  Lab 11/05/17 0909 11/05/17 1439 11/05/17 2036 11/06/17 0909  TROPONINI 1.18* 1.30* 1.68* 1.47*   No results for input(s): TROPIPOC in the last 168 hours.   BNP Recent Labs  Lab 11/02/2017 1753 11/04/17 1619  BNP 36.0 368.0*     DDimer No results for input(s): DDIMER in the last 168 hours.   Radiology    Dg Chest 1 View  Result Date: 11/09/2017 CLINICAL DATA:  Respiratory failure EXAM: CHEST 1 VIEW COMPARISON:  11/08/2017 FINDINGS: Stable endotracheal tube, NG tube, and right PICC line positions. Left subclavian 2 lead pacer noted. Cardiomegaly remains with diffuse airspace  process versus edema. No enlarging effusion or pneumothorax. No significant interval change in aeration. IMPRESSION: Stable cardiomegaly with diffuse airspace process versus edema. Electronically Signed   By: Jerilynn Mages.  Shick M.D.   On: 11/09/2017 08:12   Dg Chest Port 1 View  Result Date: 11/10/2017 CLINICAL DATA:  Respiratory failure EXAM: PORTABLE CHEST 1 VIEW COMPARISON:  11/09/2017 FINDINGS: Endotracheal tube in good position. NG tube in place. Dual lead pacemaker unchanged. Progression of hazy bilateral airspace disease which may represent edema. Mild bibasilar atelectasis also has progressed. Hypoventilation has progressed. IMPRESSION: Endotracheal tube in good position Hypoventilation with increased atelectasis in the bases. Progression of diffuse bilateral airspace disease suggestive of edema. Electronically Signed   By: Franchot Gallo M.D.   On: 11/10/2017 08:20   Dg Chest Port 1 View  Result Date: 11/09/2017 CLINICAL DATA:  Respiratory failure. EXAM: PORTABLE CHEST 1 VIEW COMPARISON:  11/09/2017 FINDINGS: Left-sided pacemaker unchanged. Endotracheal tube unchanged. Enteric tube courses into the region of the stomach and off the inferior portion of the film. Right subclavian central venous catheter has tip over the SVC unchanged. Lungs are adequately inflated and demonstrate mild prominence of the perihilar markings suggesting a degree of vascular congestion. No evidence of effusion. Remainder the exam is unchanged. IMPRESSION: Suggestion of mild vascular congestion. Tubes and lines as described. Electronically Signed   By: Marin Olp M.D.   On: 11/09/2017 13:59    Cardiac Studies    Patient Profile     AZARIE CORIZ a 52 y.o.femalewith a hx of sinus node dysfunction with pacemaker, COPD, Crohns Consulted for evaluation of SOB at the request of Dr Dyann Kief.  LVEF depressed  (new)    Assessment & Plan    1. Acute systolic heart failure - LVEF down from normal to 30% from just last  month, primarily apical WMAs would suggest Takotsubo   Suspect stress induced CM in setting of influenza and respiratory failure. ACS less likely.Possible cath when stabilizes or improves  CXR with evid of poss edema  Getting free  water due to hypernatremia  Will recheck electrolytes     2. Influenza - management per primary team.   3. Troponin elevation  Peak elevation 1.64  EKG without acute ischemic changes   Cont on ASA and Plavix (was on both at home with history of CVA) Lipitor 80 started  Finished heparin gtt x 48 hours  Possible LHC with recovers  4  Renal  Lasix held  Now with hypernatremia  Getting free H2) Recheck BMET this PM     5. COPD exacerbation - per pulmonary  On steroids which may explain increased WBC and increased BUN (Cr OK)   Pt remains critically ill.      For questions or updates, please contact [place] Please consult [email or web address] for contact info under Cardiology/STEMI.      Signed, Dorris Carnes, MD  11/10/2017, 10:46 AM

## 2017-11-10 NOTE — Progress Notes (Signed)
Inpatient Diabetes Program Recommendations  AACE/ADA: New Consensus Statement on Inpatient Glycemic Control (2015)  Target Ranges:  Prepandial:   less than 140 mg/dL      Peak postprandial:   less than 180 mg/dL (1-2 hours)      Critically ill patients:  140 - 180 mg/dL   Results for [name], [name] (MRN [id number]) as of 11/10/2017 07:08  Ref. Range 11/09/2017 09:57 11/09/2017 11:27 11/09/2017 13:07 11/09/2017 14:14 11/09/2017 16:10 11/09/2017 19:43 11/09/2017 23:59 11/10/2017 04:07  Glucose-Capillary Latest Ref Range: 65 - 99 mg/dL 361 (H) 443 (H) 154 (H) 140 (H) 208 (H) 272 (H) 264 (H) 272 (H)   Review of Glycemic Control  Diabetes history: DM2 Outpatient Diabetes medications: Novolog 15-21 units TID, Invokana 100 mg daily, Levemir 80 units QHS, Metformin 1000 mg BID Current orders for Inpatient glycemic control: Lantus 90 units daily, Novolog 14 units Q4H; Solumedrol 40 mg Q8H  Inpatient Diabetes Program Recommendations: Correction (SSI): Please consider ordering Novolog correction scale Q4H (in addition to ordered Novolog 14 units Q4H).  NOTE: Noted Lantus increase from 80 to 90 units daily and set Novolog dose increased from 12 to 14 units Q4H today. Recommend adding Novolog correction scale Q4H.  Thanks, Orlando Penner, RN, MSN, CDE Diabetes Coordinator Inpatient Diabetes Program [phone] (Team Pager from 8am to 5pm)

## 2017-11-11 ENCOUNTER — Inpatient Hospital Stay (HOSPITAL_COMMUNITY): Payer: Medicaid Other

## 2017-11-11 ENCOUNTER — Encounter (HOSPITAL_COMMUNITY): Payer: Self-pay | Admitting: Cardiology

## 2017-11-11 DIAGNOSIS — J441 Chronic obstructive pulmonary disease with (acute) exacerbation: Secondary | ICD-10-CM

## 2017-11-11 DIAGNOSIS — J101 Influenza due to other identified influenza virus with other respiratory manifestations: Secondary | ICD-10-CM

## 2017-11-11 DIAGNOSIS — I21A1 Myocardial infarction type 2: Secondary | ICD-10-CM

## 2017-11-11 DIAGNOSIS — I5181 Takotsubo syndrome: Secondary | ICD-10-CM

## 2017-11-11 LAB — GLUCOSE, CAPILLARY
GLUCOSE-CAPILLARY: 116 mg/dL — AB (ref 65–99)
GLUCOSE-CAPILLARY: 108 mg/dL — AB (ref 65–99)
GLUCOSE-CAPILLARY: 151 mg/dL — AB (ref 65–99)
Glucose-Capillary: 154 mg/dL — ABNORMAL HIGH (ref 65–99)
Glucose-Capillary: 124 mg/dL — ABNORMAL HIGH (ref 65–99)
Glucose-Capillary: 158 mg/dL — ABNORMAL HIGH (ref 65–99)

## 2017-11-11 LAB — COMPREHENSIVE METABOLIC PANEL
ALT: 53 U/L (ref 14–54)
AST: 63 U/L — ABNORMAL HIGH (ref 15–41)
Albumin: 2.1 g/dL — ABNORMAL LOW (ref 3.5–5.0)
Alkaline Phosphatase: 107 U/L (ref 38–126)
Anion gap: 9 (ref 5–15)
BUN: 64 mg/dL — ABNORMAL HIGH (ref 6–20)
CO2: 17 mmol/L — ABNORMAL LOW (ref 22–32)
Calcium: 8.5 mg/dL — ABNORMAL LOW (ref 8.9–10.3)
Chloride: 120 mmol/L — ABNORMAL HIGH (ref 101–111)
Creatinine, Ser: 0.79 mg/dL (ref 0.44–1.00)
GFR calc Af Amer: >60 mL/min (ref >60)
GFR calc non Af Amer: >60 mL/min (ref >60)
Glucose, Bld: 124 mg/dL — ABNORMAL HIGH (ref 65–99)
Potassium: 3.9 mmol/L (ref 3.5–5.1)
Sodium: 146 mmol/L — ABNORMAL HIGH (ref 135–145)
Total Bilirubin: 0.3 mg/dL (ref 0.3–1.2)
Total Protein: 5.6 g/dL — ABNORMAL LOW (ref 6.5–8.1)

## 2017-11-11 LAB — BLOOD GAS, ARTERIAL
Acid-base deficit: 7.2 mmol/L — ABNORMAL HIGH (ref 0.0–2.0)
Bicarbonate: 18.7 mmol/L — ABNORMAL LOW (ref 20.0–28.0)
Drawn by: 382351
FIO2: 50
MECHVT: 500 mL
O2 Saturation: 96.2 %
PEEP: 5 cmH2O
Patient temperature: 37
RATE: 22 resp/min
pCO2 arterial: 34 mmHg (ref 32.0–48.0)
pH, Arterial: 7.333 — ABNORMAL LOW (ref 7.350–7.450)
pO2, Arterial: 95.2 mmHg (ref 83.0–108.0)

## 2017-11-11 LAB — COOXEMETRY PANEL
Carboxyhemoglobin: 0.5 % (ref 0.5–1.5)
Methemoglobin: 1.4 % (ref 0.0–1.5)
O2 Saturation: 96.2 %
Total hemoglobin: 13.2 g/dL (ref 12.0–16.0)
Total oxygen content: 17.6 mL/dL (ref 15.0–23.0)

## 2017-11-11 LAB — TRIGLYCERIDES: Triglycerides: 329 mg/dL — ABNORMAL HIGH (ref <150)

## 2017-11-11 MED ORDER — FREE WATER
300.0000 mL | Status: AC
  Administered 2017-11-11 – 2017-11-12 (×6): 300 mL

## 2017-11-11 NOTE — Progress Notes (Signed)
Subjective: She remains intubated and on the ventilator.  No new problems have been noted.  Objective: Vital signs in last 24 hours: Temp:  [99 F (37.2 C)-100 F (37.8 C)] 99.6 F (37.6 C) (02/19 0400) Pulse Rate:  [83-108] 100 (02/19 0600) Resp:  [18-39] 25 (02/19 0600) BP: (89-142)/(61-92) 121/72 (02/19 0600) SpO2:  [92 %-99 %] 97 % (02/19 0600) FiO2 (%):  [45 %-55 %] 50 % (02/19 0326) Weight:  [95.6 kg (210 lb 12.2 oz)] 95.6 kg (210 lb 12.2 oz) (02/19 0500) Weight change: 0.9 kg (1 lb 15.8 oz) Last BM Date: 11/10/17  Intake/Output from previous day: 02/18 0701 - 02/19 0700 In: 2511.6 [I.V.:869.7; MO/LM:7867.5; IV Piggyback:100] Out: 3000 [Urine:3000]  PHYSICAL EXAM General appearance: Intubated sedated on mechanical ventilation Resp: Fairly clear but diminished breath sounds Cardio: Regular rhythm mostly paced on monitor I do not hear a gallop GI: soft, non-tender; bowel sounds normal; no masses,  no organomegaly Extremities: extremities normal, atraumatic, no cyanosis or edema  Lab Results:  Results for orders placed or performed during the hospital encounter of 11/07/2017 (from the past 48 hour(s))  Glucose, capillary     Status: Abnormal   Collection Time: 11/09/17  8:34 AM  Result Value Ref Range   Glucose-Capillary 160 (H) 65 - 99 mg/dL  Glucose, capillary     Status: Abnormal   Collection Time: 11/09/17  9:57 AM  Result Value Ref Range   Glucose-Capillary 167 (H) 65 - 99 mg/dL  Glucose, capillary     Status: Abnormal   Collection Time: 11/09/17 11:27 AM  Result Value Ref Range   Glucose-Capillary 205 (H) 65 - 99 mg/dL  Glucose, capillary     Status: Abnormal   Collection Time: 11/09/17  1:07 PM  Result Value Ref Range   Glucose-Capillary 161 (H) 65 - 99 mg/dL  Glucose, capillary     Status: Abnormal   Collection Time: 11/09/17  2:14 PM  Result Value Ref Range   Glucose-Capillary 140 (H) 65 - 99 mg/dL  Glucose, capillary     Status: Abnormal   Collection  Time: 11/09/17  4:10 PM  Result Value Ref Range   Glucose-Capillary 208 (H) 65 - 99 mg/dL  Glucose, capillary     Status: Abnormal   Collection Time: 11/09/17  7:43 PM  Result Value Ref Range   Glucose-Capillary 272 (H) 65 - 99 mg/dL   Comment 1 Notify RN   Glucose, capillary     Status: Abnormal   Collection Time: 11/09/17 11:59 PM  Result Value Ref Range   Glucose-Capillary 264 (H) 65 - 99 mg/dL   Comment 1 Notify RN    Comment 2 Document in Chart   Glucose, capillary     Status: Abnormal   Collection Time: 11/10/17  4:07 AM  Result Value Ref Range   Glucose-Capillary 272 (H) 65 - 99 mg/dL   Comment 1 Notify RN   Draw ABG 1 hour after initiation of ventilator     Status: Abnormal   Collection Time: 11/10/17  4:20 AM  Result Value Ref Range   FIO2 55.00    Delivery systems 500    Mode ASSIST CONTROL    VT 500 mL   LHR 22 resp/min   Peep/cpap 5.0 cm H20   pH, Arterial 7.331 (L) 7.350 - 7.450   pCO2 arterial 31.2 (L) 32.0 - 48.0 mmHg   pO2, Arterial 98.9 83.0 - 108.0 mmHg   Bicarbonate 17.7 (L) 20.0 - 28.0 mmol/L   Acid-base  deficit 8.7 (H) 0.0 - 2.0 mmol/L   O2 Saturation 96.0 %   Collection site RADIAL    Drawn by [id number]    Sample type ARTERIAL    Allens test (pass/fail) PASS PASS   Mechanical Rate 22     Comment: Performed at [facility], [address] [place], [place] [address]  CBC     Status: Abnormal   Collection Time: 11/10/17  5:33 AM  Result Value Ref Range   WBC 24.8 (H) 4.0 - 10.5 K/uL   RBC 4.16 3.87 - 5.11 MIL/uL   Hemoglobin 12.5 12.0 - 15.0 g/dL   HCT 39.5 36.0 - 46.0 %   MCV 95.0 78.0 - 100.0 fL   MCH 30.0 26.0 - 34.0 pg   MCHC 31.6 30.0 - 36.0 g/dL   RDW 15.6 (H) 11.5 - 15.5 %   Platelets 219 150 - 400 K/uL    Comment: Performed at [facility], [address] [place], [place] [address]  Comprehensive metabolic panel     Status: Abnormal   Collection Time: 11/10/17  5:33 AM  Result Value Ref Range   Sodium 150 (H) 135 - 145 mmol/L    Potassium 3.8 3.5 - 5.1 mmol/L   Chloride 122 (H) 101 - 111 mmol/L   CO2 16 (L) 22 - 32 mmol/L   Glucose, Bld 250 (H) 65 - 99 mg/dL   BUN 71 (H) 6 - 20 mg/dL   Creatinine, Ser 0.83 0.44 - 1.00 mg/dL   Calcium 8.6 (L) 8.9 - 10.3 mg/dL   Total Protein 5.5 (L) 6.5 - 8.1 g/dL   Albumin 2.0 (L) 3.5 - 5.0 g/dL   AST 53 (H) 15 - 41 U/L   ALT 41 14 - 54 U/L   Alkaline Phosphatase 102 38 - 126 U/L   Total Bilirubin 0.4 0.3 - 1.2 mg/dL   GFR calc non Af Amer >60 >60 mL/min   GFR calc Af Amer >60 >60 mL/min    Comment: (NOTE) The eGFR has been calculated using the CKD EPI equation. This calculation has not been validated in all clinical situations. eGFR's persistently <60 mL/min signify possible Chronic Kidney Disease.    Anion gap 12 5 - 15    Comment: Performed at [facility], [address], [place], [place] [address]  Triglycerides     Status: Abnormal   Collection Time: 11/10/17  5:33 AM  Result Value Ref Range   Triglycerides 414 (H) <150 mg/dL    Comment: Performed at [facility], [address], [place], [place] [address]  Glucose, capillary     Status: Abnormal   Collection Time: 11/10/17  8:13 AM  Result Value Ref Range   Glucose-Capillary 216 (H) 65 - 99 mg/dL  .Cooxemetry Panel (carboxy, met, total hgb, O2 sat)     Status: Abnormal   Collection Time: 11/10/17 11:10 AM  Result Value Ref Range   Total hemoglobin 12.5 12.0 - 16.0 g/dL   O2 Saturation 79.8 %   Carboxyhemoglobin 0.8 0.5 - 1.5 %   Methemoglobin 1.9 (H) 0.0 - 1.5 %   Total oxygen content 13.6 (L) 15.0 - 23.0 mL/dL    Comment: Performed at [facility], [address] [place], [place] [address]  Glucose, capillary     Status: Abnormal   Collection Time: 11/10/17 11:27 AM  Result Value Ref Range   Glucose-Capillary 234 (H) 65 - 99 mg/dL  Glucose, capillary     Status: Abnormal   Collection Time: 11/10/17  4:48 PM  Result Value Ref Range   Glucose-Capillary 129 (H) 65 - 99 mg/dL  Glucose, capillary     Status:  Abnormal   Collection Time: 11/10/17  8:18 PM  Result Value Ref Range   Glucose-Capillary 119 (H) 65 - 99 mg/dL  Glucose, capillary     Status: Abnormal   Collection Time: 11/10/17 11:21 PM  Result Value Ref Range   Glucose-Capillary 133 (H) 65 - 99 mg/dL  Glucose, capillary     Status: Abnormal   Collection Time: 11/11/17  3:29 AM  Result Value Ref Range   Glucose-Capillary 116 (H) 65 - 99 mg/dL  Draw ABG 1 hour after initiation of ventilator     Status: Abnormal   Collection Time: 11/11/17  3:47 AM  Result Value Ref Range   FIO2 50.00    Delivery systems VENTILATOR    VT 500 mL   LHR 22 resp/min   Peep/cpap 5.0 cm H20   pH, Arterial 7.333 (L) 7.350 - 7.450   pCO2 arterial 34.0 32.0 - 48.0 mmHg   pO2, Arterial 95.2 83.0 - 108.0 mmHg   Bicarbonate 18.7 (L) 20.0 - 28.0 mmol/L   Acid-base deficit 7.2 (H) 0.0 - 2.0 mmol/L   O2 Saturation 96.2 %   Patient temperature 37.0    Collection site LEFT RADIAL    Drawn by [id number]    Sample type ARTERIAL DRAW    Allens test (pass/fail) PASS PASS    Comment: Performed at [facility], [address], [place], [place] [address]  .Cooxemetry Panel (carboxy, met, total hgb, O2 sat)     Status: None   Collection Time: 11/11/17  3:47 AM  Result Value Ref Range   Total hemoglobin 13.2 12.0 - 16.0 g/dL   O2 Saturation 96.2 %   Carboxyhemoglobin 0.5 0.5 - 1.5 %   Methemoglobin 1.4 0.0 - 1.5 %   Total oxygen content 17.6 15.0 - 23.0 mL/dL    Comment: Performed at [facility], [address], [place], [place] [address]  Comprehensive metabolic panel     Status: Abnormal   Collection Time: 11/11/17  4:37 AM  Result Value Ref Range   Sodium 146 (H) 135 - 145 mmol/L   Potassium 3.9 3.5 - 5.1 mmol/L   Chloride 120 (H) 101 - 111 mmol/L   CO2 17 (L) 22 - 32 mmol/L   Glucose, Bld 124 (H) 65 - 99 mg/dL   BUN 64 (H) 6 - 20 mg/dL   Creatinine, Ser 0.79 0.44 - 1.00 mg/dL   Calcium 8.5 (L) 8.9 - 10.3 mg/dL   Total Protein 5.6 (L) 6.5 - 8.1 g/dL    Albumin 2.1 (L) 3.5 - 5.0 g/dL   AST 63 (H) 15 - 41 U/L   ALT 53 14 - 54 U/L   Alkaline Phosphatase 107 38 - 126 U/L   Total Bilirubin 0.3 0.3 - 1.2 mg/dL   GFR calc non Af Amer >60 >60 mL/min   GFR calc Af Amer >60 >60 mL/min    Comment: (NOTE) The eGFR has been calculated using the CKD EPI equation. This calculation has not been validated in all clinical situations. eGFR's persistently <60 mL/min signify possible Chronic Kidney Disease.    Anion gap 9 5 - 15    Comment: Performed at [facility], [address], [place], [place] [address]  Triglycerides     Status: Abnormal   Collection Time: 11/11/17  4:37 AM  Result Value Ref Range   Triglycerides 329 (  H) <150 mg/dL    Comment: Performed at [facility], [address] [place], [place] [address]    ABGS Recent Labs    11/11/17 0347  PHART 7.333*  PO2ART 95.2  HCO3 18.7*   CULTURES Recent Results (from the past 240 hour(s))  Blood Culture (routine x 2)     Status: None   Collection Time: 11/11/2017  6:27 PM  Result Value Ref Range Status   Specimen Description RIGHT ANTECUBITAL  Final   Special Requests   Final    BOTTLES DRAWN AEROBIC AND ANAEROBIC Blood Culture adequate volume   Culture   Final    NO GROWTH 5 DAYS Performed at [facility], [address], [place], [place] [address]    Report Status 11/08/2017 FINAL  Final  Blood Culture (routine x 2)     Status: None   Collection Time: 11/14/2017  6:27 PM  Result Value Ref Range Status   Specimen Description RIGHT ANTECUBITAL  Final   Special Requests   Final    BOTTLES DRAWN AEROBIC AND ANAEROBIC Blood Culture adequate volume   Culture   Final    NO GROWTH 5 DAYS Performed at [facility], [address], [place], [place] [address]    Report Status 11/08/2017 FINAL  Final  MRSA PCR Screening     Status: None   Collection Time: 11/04/17  1:26 AM  Result Value Ref Range Status   MRSA by PCR NEGATIVE NEGATIVE Final    Comment:        The GeneXpert MRSA Assay  (FDA approved for NASAL specimens only), is one component of a comprehensive MRSA colonization surveillance program. It is not intended to diagnose MRSA infection nor to guide or monitor treatment for MRSA infections. Performed at [facility], [address], [place], [place] [address]    Studies/Results: Dg Chest Port 1 View  Result Date: 11/10/2017 CLINICAL DATA:  Respiratory failure EXAM: PORTABLE CHEST 1 VIEW COMPARISON:  11/09/2017 FINDINGS: Endotracheal tube in good position. NG tube in place. Dual lead pacemaker unchanged. Progression of hazy bilateral airspace disease which may represent edema. Mild bibasilar atelectasis also has progressed. Hypoventilation has progressed. IMPRESSION: Endotracheal tube in good position Hypoventilation with increased atelectasis in the bases. Progression of diffuse bilateral airspace disease suggestive of edema. Electronically Signed   By: Franchot Gallo M.D.   On: 11/10/2017 08:20   Dg Chest Port 1 View  Result Date: 11/09/2017 CLINICAL DATA:  Respiratory failure. EXAM: PORTABLE CHEST 1 VIEW COMPARISON:  11/09/2017 FINDINGS: Left-sided pacemaker unchanged. Endotracheal tube unchanged. Enteric tube courses into the region of the stomach and off the inferior portion of the film. Right subclavian central venous catheter has tip over the SVC unchanged. Lungs are adequately inflated and demonstrate mild prominence of the perihilar markings suggesting a degree of vascular congestion. No evidence of effusion. Remainder the exam is unchanged. IMPRESSION: Suggestion of mild vascular congestion. Tubes and lines as described. Electronically Signed   By: Marin Olp M.D.   On: 11/09/2017 13:59    Medications:  Prior to Admission:  Medications Prior to Admission  Medication Sig Dispense Refill Last Dose  . acetaminophen (TYLENOL) 500 MG tablet Take 500 mg by mouth every 6 (six) hours as needed for mild pain or moderate pain.   11/02/2017 at Unknown time  .  aspirin EC 81 MG tablet Take 81 mg by mouth daily.   11/15/2017 at Unknown time  . carvedilol (COREG) 6.25 MG tablet Take 1 tablet (6.25 mg  total) by mouth 2 (two) times daily. 180 tablet 3 10/26/2017 at Unknown time  . Cholecalciferol (VITAMIN D3) 5000 units CAPS Take 1 capsule (5,000 Units total) by mouth daily. 90 capsule 0 10/27/2017 at Unknown time  . clopidogrel (PLAVIX) 75 MG tablet Take 75 mg by mouth daily.   11/02/2017 at Unknown time  . diclofenac sodium (VOLTAREN) 1 % GEL Apply 4 g topically 4 (four) times daily. (Patient taking differently: Apply 2-4 g topically daily as needed (for leg pain). ) 200 g 11 Past Month at Unknown time  . dicyclomine (BENTYL) 20 MG tablet Take 1 tablet (20 mg total) by mouth every 8 (eight) hours as needed for spasms. 90 tablet 3 11/13/2017 at Unknown time  . diphenoxylate-atropine (LOMOTIL) 2.5-0.025 MG tablet Take 2 tabs by mouth x 1, then 1 tab every 4 hrs PRN, max of 8 tabs in 24 hour time 240 tablet 1 Past Week at Unknown time  . DULoxetine (CYMBALTA) 30 MG capsule Take 90 mg daily (60 mg + 30 mg) (Patient taking differently: Take 30 mg by mouth daily. ) 30 capsule 0 10/27/2017 at Unknown time  . DULoxetine (CYMBALTA) 60 MG capsule Take 90 mg daily (60 mg + 30 mg) 30 capsule 0 10/30/2017 at Unknown time  . furosemide (LASIX) 40 MG tablet TAKE (1) TABLET BY MOUTH EACH MORNING. 90 tablet 3 11/09/2017 at Unknown time  . ibuprofen (ADVIL,MOTRIN) 600 MG tablet Take 600 mg by mouth 2 (two) times daily as needed for moderate pain.    10/28/2017 at Unknown time  . insulin aspart (NOVOLOG FLEXPEN) 100 UNIT/ML FlexPen Inject 15-21 Units into the skin 3 (three) times daily with meals. 5 pen 2 11/06/2017 at Unknown time  . INVOKANA 100 MG TABS tablet TAKE 1 TABLET BY MOUTH DAILY BEFORE BREAKFAST. 30 tablet 2 11/04/2017 at Unknown time  . LEVEMIR FLEXTOUCH 100 UNIT/ML Pen INJECT 80 UNITS UNDER THE SKIN EVERY DAY AT 10 PM 30 mL 2 11/02/2017 at Unknown time  . lisinopril  (PRINIVIL,ZESTRIL) 5 MG tablet Take 5 mg by mouth daily.   10/27/2017 at Unknown time  . LORazepam (ATIVAN) 0.5 MG tablet Take 1 tablet (0.5 mg total) by mouth daily as needed for anxiety. 30 tablet 0 Past Week at Unknown time  . LYRICA 150 MG capsule take 143m by mouth three times daily  0 10/27/2017 at Unknown time  . meloxicam (MOBIC) 15 MG tablet Take 15 mg by mouth daily.   10/25/2017 at Unknown time  . metFORMIN (GLUCOPHAGE) 1000 MG tablet TAKE 1 TABLET(1000 MG) BY MOUTH TWICE DAILY WITH A MEAL 60 tablet 3 10/24/2017 at Unknown time  . nitroGLYCERIN (NITROSTAT) 0.4 MG SL tablet Place 1 tablet (0.4 mg total) under the tongue every 5 (five) minutes as needed for chest pain. 30 tablet 0 unknown  . pantoprazole (PROTONIX) 40 MG tablet Take 1 tablet (40 mg total) by mouth 2 (two) times daily. 60 tablet 0 11/11/2017 at Unknown time  . potassium chloride (K-DUR) 10 MEQ tablet Take 1 tablet (10 mEq total) by mouth daily. 90 tablet 3 11/19/2017 at Unknown time  . pramipexole (MIRAPEX) 0.25 MG tablet Take 0.25 mg by mouth 2 (two) times daily.    11/20/2017 at Unknown time  . simvastatin (ZOCOR) 10 MG tablet TAKE (1) TABLET BY MOUTH DAILY. (Patient taking differently: TAKE (2) TABLET BY MOUTH DAILY.) 90 tablet 2 10/25/2017 at Unknown time  . tapentadol (NUCYNTA ER) 100 MG 12 hr tablet Take 100 mg by mouth  every 12 (twelve) hours.   11/02/2017 at Unknown time  . tiZANidine (ZANAFLEX) 4 MG capsule Take 4 mg by mouth 3 (three) times daily.   11/12/2017 at Unknown time  . albuterol (PROVENTIL) (2.5 MG/3ML) 0.083% nebulizer solution Take 3 mLs (2.5 mg total) by nebulization every 6 (six) hours as needed for wheezing or shortness of breath. 75 mL 12 unknown  . colestipol (COLESTID) 1 g tablet Take 1 tablet (1 g total) by mouth 2 (two) times daily. (Patient not taking: Reported on 10/25/2017) 90 tablet 3 unknown  . inFLIXimab 500 mg in sodium chloride 0.9 % 200 mL Inject 500 mg into the vein every 8 (eight) weeks. 1 Dose 6  Taking  . OXYGEN Inhale 3 L/day into the lungs at bedtime.   Taking   Scheduled: . aspirin EC  81 mg Oral Daily  . atorvastatin  80 mg Oral q1800  . budesonide (PULMICORT) nebulizer solution  0.5 mg Nebulization BID  . chlorhexidine gluconate (MEDLINE KIT)  15 mL Mouth Rinse BID  . Chlorhexidine Gluconate Cloth  6 each Topical Daily  . clopidogrel  75 mg Oral Daily  . feeding supplement (PRO-STAT SUGAR FREE 64)  60 mL Per Tube TID  . feeding supplement (VITAL HIGH PROTEIN)  1,000 mL Per Tube Q24H  . free water  300 mL Per Tube Q4H  . heparin injection (subcutaneous)  5,000 Units Subcutaneous Q8H  . insulin aspart  0-20 Units Subcutaneous Q4H  . insulin aspart  16 Units Subcutaneous Q4H  . insulin glargine  90 Units Subcutaneous Daily  . ipratropium  0.5 mg Nebulization Q6H  . levalbuterol  0.63 mg Nebulization Q6H  . mouth rinse  15 mL Mouth Rinse 10 times per day  . methylPREDNISolone (SOLU-MEDROL) injection  40 mg Intravenous Q8H  . pantoprazole (PROTONIX) IV  40 mg Intravenous QHS  . potassium chloride  10 mEq Oral Daily  . sodium chloride flush  10-40 mL Intracatheter Q12H  . tapentadol  100 mg Oral Q12H   Continuous: . ceFEPime (MAXIPIME) IV Stopped (11/11/17 0247)  . propofol (DIPRIVAN) infusion 60 mcg/kg/min (11/11/17 7672)   CNO:[id number] **OR** acetaminophen, fentaNYL (SUBLIMAZE) injection, fentaNYL (SUBLIMAZE) injection, levalbuterol, LORazepam, sodium chloride flush  Assesment: She was admitted with influenza A.  She has acute hypoxic respiratory failure requiring ventilator support.  She has diffuse bilateral pneumonia and that seems to be slowly improving.  She is still requiring 50% oxygen.  She has acute systolic heart failure presumably from her current illness.  At baseline she has COPD and she is being treated for that.  She has had multiple electrolyte abnormalities which seem to be improving.  She has diabetes and developed diabetic ketoacidosis which  is better.  At baseline she has obstructive sleep apnea which may complicate her situation  She has chronic pain at baseline and has periods where it seems like she may be having increased pain which responds to fentanyl Active Problems:   DM type 2 causing vascular disease ([place])   Current smoker   ESOPHAGEAL STRICTURE   Esophageal reflux   Gastroparesis   Cardiac pacemaker in situ   COPD GOLD 0 / still smoking    OSA (obstructive sleep apnea)   Diabetic neuropathy (HCC)   Dysphagia   Crohn's disease without complication (HCC)   COPD exacerbation (HCC)   Influenza A   Acute respiratory failure with hypoxia (HCC)   Hyponatremia   Hypokalemia    Plan: Continue current treatments.  No attempt at  weaning today.    LOS: 8 days   Braelyn Jenson L 11/11/2017, 7:53 AM

## 2017-11-11 NOTE — Progress Notes (Signed)
Per Respiratory therapy, O2 is still too high and no weaning will be attempted today.

## 2017-11-11 NOTE — Progress Notes (Signed)
[facility] PROGRESS NOTE  [name] GPQ:[id number] DOB: [birth year] DOA: 11/07/2017 PCP: [facility] [place]  Interim summary and HPI 52 y.o. female,  w Copd on home O2, apparently ran out of her breathing treatments and c/o increase in dyspnea. Pt states that her breathing started to become worse on Friday.  + cough w yellow sputum.  Denies fever, chills, cp, palpitations, brbpr.   Pt notes n/v, general malaise and diarrhea over the past 2 days.   In ED, pox on RA was in the mid to high 80's; found to have positive influenza A. CXR also demonstrated coarse interstitial marking and vascular congestion; unable to r/o underlying infiltrates. WBC's 12.6, fever 102.6, RR 25  Of, note patient was also found to be on DKA and 2-D Echo demonstrated EF of 30% with concerns for Takotsubo, cardiology following.   Assessment/Plan: 1-acute resp failure with hypoxia: in the setting of COPD exacerbation and influenza A and pneumonia -continue ventilatory support -appreciate assistance by pulmonary/critical care service -continue steroids, Duoneb and antibiotics -continue pulmicort BID and atrovent QID -continue current antibiotics for pneumonia coverage, complete cefepime after 8 days.  2-DKA: RESOLVED NOW -in the setting of steroid use and infection and possibly invokana (currently being held) -Pt taken off IV insulin infusion and transitioned to lantus and Q 4 hour novolog, added SSI coverage as well.     -follow CBGs -follow BMP CBG (last 3)  Recent Labs    11/11/17 0810 11/11/17 1206 11/11/17 1603  GLUCAP 108* 154* 124*   3-acute Cardiomyopathy: concern for takotsubo; positive apical wall motion abnormalities and decrease EF; no prior hx of CAD or heart failure.  -cardiology consulted and working patient up -PICC line to check CVP and COX -diuretics BEING HELD per cardiology service -given low BP now, will avoid the use of B-blocker, ARB/ACEI -daily weights and  strict intake and output  Filed Weights   11/10/17 0500 11/11/17 0430 11/11/17 0500  Weight: 94.7 kg (208 lb 12.4 oz) 95.6 kg (210 lb 12.2 oz) 95.6 kg (210 lb 12.2 oz)   4-influenza A - treated -will continue providing supportive care and completed course of tamiflu  5-GERD/GI prophylaxis -continue PPI  6-Sepsis: clinically improving  -code sepsis initiated -continue cefepime x 8 days, then discontinue  7. Elevated troponin / ACS - pt was treated with 48 hours of IV heparin infusion  8. Hypernatremia - added additional free water and following.   Code Status: Full Family Communication: no family at bedside Disposition Plan: remains in ICU, continue ventilatory support.   Consultants:  Cardiology  Pulmonary/critical care  Procedures:  See below for x-ray reports   2-D echo: EF 30%, apical WMA  Antibiotics:  Cefepime  HPI/Subjective: Currently afebrile and sedated on vent.  BS better controlled.   Objective: Vitals:   11/11/17 1507 11/11/17 1508  BP:    Pulse:    Resp:    Temp:    SpO2: 96% 96%    Intake/Output Summary (Last 24 hours) at 11/11/2017 1644 Last data filed at 11/11/2017 1500 Gross per 24 hour  Intake 2121.57 ml  Output 3250 ml  Net -1128.43 ml   Filed Weights   11/10/17 0500 11/11/17 0430 11/11/17 0500  Weight: 94.7 kg (208 lb 12.4 oz) 95.6 kg (210 lb 12.2 oz) 95.6 kg (210 lb 12.2 oz)    Exam:   General:  Afebrile, sedated and ventilated.  Cardiovascular: normal S1 and S2, no rubs or gallops.   Respiratory: expiratory wheezes heard  bilateral better air movement noted. Crackles heard bases.   Abdomen: obese, soft, NT, ND, positive BS.  Musculoskeletal: SCDs bilateral LEs, no cyanosis, no clubbing   Data Reviewed: Basic Metabolic Panel: Recent Labs  Lab 11/06/17 0610 11/07/17 0556 11/08/17 0433 11/09/17 0600 11/10/17 0533 11/11/17 0437  NA  --  141 141 146* 150* 146*  K  --  3.3* 3.6 3.8 3.8 3.9  CL  --  114* 116* 120*  122* 120*  CO2  --  17* 16* 17* 16* 17*  GLUCOSE  --  146* 181* 174* 250* 124*  BUN  --  55* 57* 62* 71* 64*  CREATININE  --  1.10* 0.95 0.87 0.83 0.79  CALCIUM  --  8.7* 8.5* 8.5* 8.6* 8.5*  MG 1.9 2.2  --   --   --   --    Liver Function Tests: Recent Labs  Lab 11/09/17 0600 11/10/17 0533 11/11/17 0437  AST 34 53* 63*  ALT 27 41 53  ALKPHOS 64 102 107  BILITOT 0.7 0.4 0.3  PROT 5.9* 5.5* 5.6*  ALBUMIN 2.1* 2.0* 2.1*   No results for input(s): LIPASE, AMYLASE in the last 168 hours. CBC: Recent Labs  Lab 11/06/17 0513 11/07/17 0556 11/08/17 0433 11/09/17 0600 11/10/17 0533  WBC 12.9* 16.6* 22.3* 25.5* 24.8*  HGB 12.6 12.0 12.6 12.3 12.5  HCT 38.4 37.1 38.2 37.4 39.5  MCV 92.5 93.9 93.9 92.6 95.0  PLT 156 185 201 199 219   Cardiac Enzymes: Recent Labs  Lab 11/05/17 0458 11/05/17 0909 11/05/17 1439 11/05/17 2036 11/06/17 0909  TROPONINI 1.26* 1.18* 1.30* 1.68* 1.47*   BNP (last 3 results) Recent Labs    09/20/17 2346 11/06/2017 1753 11/04/17 1619  BNP 227.0* 36.0 368.0*    CBG: Recent Labs  Lab 11/10/17 2321 11/11/17 0329 11/11/17 0810 11/11/17 1206 11/11/17 1603  GLUCAP 133* 116* 108* 154* 124*    Recent Results (from the past 240 hour(s))  Blood Culture (routine x 2)     Status: None   Collection Time: 10/31/2017  6:27 PM  Result Value Ref Range Status   Specimen Description RIGHT ANTECUBITAL  Final   Special Requests   Final    BOTTLES DRAWN AEROBIC AND ANAEROBIC Blood Culture adequate volume   Culture   Final    NO GROWTH 5 DAYS Performed at [facility], [address], [place], [place] [address]    Report Status 11/08/2017 FINAL  Final  Blood Culture (routine x 2)     Status: None   Collection Time: 11/13/2017  6:27 PM  Result Value Ref Range Status   Specimen Description RIGHT ANTECUBITAL  Final   Special Requests   Final    BOTTLES DRAWN AEROBIC AND ANAEROBIC Blood Culture adequate volume   Culture   Final    NO GROWTH 5  DAYS Performed at [facility], [address] [place], [place] [address]    Report Status 11/08/2017 FINAL  Final  MRSA PCR Screening     Status: None   Collection Time: 11/04/17  1:26 AM  Result Value Ref Range Status   MRSA by PCR NEGATIVE NEGATIVE Final    Comment:        The GeneXpert MRSA Assay (FDA approved for NASAL specimens only), is one component of a comprehensive MRSA colonization surveillance program. It is not intended to diagnose MRSA infection nor to guide or monitor treatment for MRSA infections. Performed at [facility], [address], [place], [place] [address]  Studies: Dg Chest Port 1 View  Result Date: 11/11/2017 CLINICAL DATA:  Respiratory failure, COPD EXAM: PORTABLE CHEST 1 VIEW COMPARISON:  11/10/2017 FINDINGS: Left pacer remains in place, unchanged. Cardiomegaly with vascular congestion. Right lower lobe airspace opacity is unchanged. Possible small right effusion. IMPRESSION: Continued right lower lobe infiltrate with small right effusion. Mild cardiomegaly, vascular congestion. Electronically Signed   By: Rolm Baptise M.D.   On: 11/11/2017 08:07   Dg Chest Port 1 View  Result Date: 11/10/2017 CLINICAL DATA:  Respiratory failure EXAM: PORTABLE CHEST 1 VIEW COMPARISON:  11/09/2017 FINDINGS: Endotracheal tube in good position. NG tube in place. Dual lead pacemaker unchanged. Progression of hazy bilateral airspace disease which may represent edema. Mild bibasilar atelectasis also has progressed. Hypoventilation has progressed. IMPRESSION: Endotracheal tube in good position Hypoventilation with increased atelectasis in the bases. Progression of diffuse bilateral airspace disease suggestive of edema. Electronically Signed   By: Franchot Gallo M.D.   On: 11/10/2017 08:20   Scheduled Meds: . aspirin EC  81 mg Oral Daily  . atorvastatin  80 mg Oral q1800  . budesonide (PULMICORT) nebulizer solution  0.5 mg Nebulization BID  . chlorhexidine gluconate  (MEDLINE KIT)  15 mL Mouth Rinse BID  . Chlorhexidine Gluconate Cloth  6 each Topical Daily  . clopidogrel  75 mg Oral Daily  . feeding supplement (PRO-STAT SUGAR FREE 64)  60 mL Per Tube TID  . feeding supplement (VITAL HIGH PROTEIN)  1,000 mL Per Tube Q24H  . free water  300 mL Per Tube Q4H  . heparin injection (subcutaneous)  5,000 Units Subcutaneous Q8H  . insulin aspart  0-20 Units Subcutaneous Q4H  . insulin aspart  16 Units Subcutaneous Q4H  . insulin glargine  90 Units Subcutaneous Daily  . ipratropium  0.5 mg Nebulization Q6H  . levalbuterol  0.63 mg Nebulization Q6H  . mouth rinse  15 mL Mouth Rinse 10 times per day  . methylPREDNISolone (SOLU-MEDROL) injection  40 mg Intravenous Q8H  . pantoprazole (PROTONIX) IV  40 mg Intravenous QHS  . potassium chloride  10 mEq Oral Daily  . sodium chloride flush  10-40 mL Intracatheter Q12H  . tapentadol  100 mg Oral Q12H   Continuous Infusions: . ceFEPime (MAXIPIME) IV Stopped (11/11/17 0940)  . propofol (DIPRIVAN) infusion 50 mcg/kg/min (11/11/17 1400)    Active Problems:   DM type 2 causing vascular disease ([place])   Current smoker   ESOPHAGEAL STRICTURE   Esophageal reflux   Gastroparesis   Cardiac pacemaker in situ   COPD GOLD 0 / still smoking    OSA (obstructive sleep apnea)   Diabetic neuropathy ([place])   Dysphagia   Crohn's disease without complication ([place])   COPD exacerbation ([place])   Influenza A   Acute respiratory failure with hypoxia ([place])   Hyponatremia   Hypokalemia  Critical Care Time spent: 34 minutes  [employer] [employer] [phone]. If 7PM-7AM, please contact night-coverage at [email or web address], password [facility] 11/11/2017, 4:44 PM  LOS: 8 days

## 2017-11-11 NOTE — Care Management Note (Signed)
/  Case Management Note  Patient Details  Name: [name] MRN: [id number] Date of Birth: [birth year]   If discussed at Long Length of Stay Meetings, dates discussed:  11/11/2017 Additional Comments:  Malcolm Metro, RN 11/11/2017, 12:30 PM

## 2017-11-11 NOTE — Progress Notes (Signed)
Progress Note  Patient Name: [name] Date of Encounter: 11/11/2017  Primary Cardiologist: Dr. Satira Sark  Subjective   Patient sedated on ventilator.  Inpatient Medications    Scheduled Meds: . aspirin EC  81 mg Oral Daily  . atorvastatin  80 mg Oral q1800  . budesonide (PULMICORT) nebulizer solution  0.5 mg Nebulization BID  . chlorhexidine gluconate (MEDLINE KIT)  15 mL Mouth Rinse BID  . Chlorhexidine Gluconate Cloth  6 each Topical Daily  . clopidogrel  75 mg Oral Daily  . feeding supplement (PRO-STAT SUGAR FREE 64)  60 mL Per Tube TID  . feeding supplement (VITAL HIGH PROTEIN)  1,000 mL Per Tube Q24H  . free water  300 mL Per Tube Q4H  . heparin injection (subcutaneous)  5,000 Units Subcutaneous Q8H  . insulin aspart  0-20 Units Subcutaneous Q4H  . insulin aspart  16 Units Subcutaneous Q4H  . insulin glargine  90 Units Subcutaneous Daily  . ipratropium  0.5 mg Nebulization Q6H  . levalbuterol  0.63 mg Nebulization Q6H  . mouth rinse  15 mL Mouth Rinse 10 times per day  . methylPREDNISolone (SOLU-MEDROL) injection  40 mg Intravenous Q8H  . pantoprazole (PROTONIX) IV  40 mg Intravenous QHS  . potassium chloride  10 mEq Oral Daily  . sodium chloride flush  10-40 mL Intracatheter Q12H  . tapentadol  100 mg Oral Q12H   Continuous Infusions: . ceFEPime (MAXIPIME) IV Stopped (11/11/17 0247)  . propofol (DIPRIVAN) infusion 50 mcg/kg/min (11/11/17 0802)   PRN Meds: acetaminophen **OR** acetaminophen, fentaNYL (SUBLIMAZE) injection, fentaNYL (SUBLIMAZE) injection, levalbuterol, LORazepam, sodium chloride flush   Vital Signs    Vitals:   11/11/17 0515 11/11/17 0530 11/11/17 0545 11/11/17 0600  BP: 127/80 131/80 (!) 139/92 121/72  Pulse: 100 (!) 101 98 100  Resp: (!) 26 (!) 26 (!) 26 (!) 25  Temp:      TempSrc:      SpO2: 97% 97% 97% 97%  Weight:      Height:        Intake/Output Summary (Last 24 hours) at 11/11/2017 0817 Last data filed at  11/11/2017 9794 Gross per 24 hour  Intake 2511.57 ml  Output 3000 ml  Net -488.43 ml   Filed Weights   11/10/17 0500 11/11/17 0430 11/11/17 0500  Weight: 208 lb 12.4 oz (94.7 kg) 210 lb 12.2 oz (95.6 kg) 210 lb 12.2 oz (95.6 kg)    Telemetry    Sinus rhythm.  Personally reviewed.  Physical Exam   GEN: No acute distress.   Neck: No JVD. Cardiac: RRR, no gallop.  Respiratory: Nonlabored. Clear to auscultation bilaterally. GI: Soft, nontender, bowel sounds present. MS: SCDs in place. Neuro:  Unable to accurately assess with sedation. Psych: Patient sedated on propofol.  Labs    Chemistry Recent Labs  Lab 11/09/17 0600 11/10/17 0533 11/11/17 0437  NA 146* 150* 146*  K 3.8 3.8 3.9  CL 120* 122* 120*  CO2 17* 16* 17*  GLUCOSE 174* 250* 124*  BUN 62* 71* 64*  CREATININE 0.87 0.83 0.79  CALCIUM 8.5* 8.6* 8.5*  PROT 5.9* 5.5* 5.6*  ALBUMIN 2.1* 2.0* 2.1*  AST 34 53* 63*  ALT 27 41 53  ALKPHOS 64 102 107  BILITOT 0.7 0.4 0.3  GFRNONAA >60 >60 >60  GFRAA >60 >60 >60  ANIONGAP 9 12 9      Hematology Recent Labs  Lab 11/08/17 0433 11/09/17 0600 11/10/17 0533  WBC 22.3* 25.5* 24.8*  RBC 4.07 4.04 4.16  HGB 12.6 12.3 12.5  HCT 38.2 37.4 39.5  MCV 93.9 92.6 95.0  MCH 31.0 30.4 30.0  MCHC 33.0 32.9 31.6  RDW 15.0 14.9 15.6*  PLT 201 199 219    Cardiac Enzymes Recent Labs  Lab 11/05/17 0909 11/05/17 1439 11/05/17 2036 11/06/17 0909  TROPONINI 1.18* 1.30* 1.68* 1.47*   No results for input(s): TROPIPOC in the last 168 hours.   BNP Recent Labs  Lab 11/04/17 1619  BNP 368.0*     Radiology    Dg Chest Port 1 View  Result Date: 11/11/2017 CLINICAL DATA:  Respiratory failure, COPD EXAM: PORTABLE CHEST 1 VIEW COMPARISON:  11/10/2017 FINDINGS: Left pacer remains in place, unchanged. Cardiomegaly with vascular congestion. Right lower lobe airspace opacity is unchanged. Possible small right effusion. IMPRESSION: Continued right lower lobe infiltrate with  small right effusion. Mild cardiomegaly, vascular congestion. Electronically Signed   By: Rolm Baptise M.D.   On: 11/11/2017 08:07   Dg Chest Port 1 View  Result Date: 11/10/2017 CLINICAL DATA:  Respiratory failure EXAM: PORTABLE CHEST 1 VIEW COMPARISON:  11/09/2017 FINDINGS: Endotracheal tube in good position. NG tube in place. Dual lead pacemaker unchanged. Progression of hazy bilateral airspace disease which may represent edema. Mild bibasilar atelectasis also has progressed. Hypoventilation has progressed. IMPRESSION: Endotracheal tube in good position Hypoventilation with increased atelectasis in the bases. Progression of diffuse bilateral airspace disease suggestive of edema. Electronically Signed   By: Franchot Gallo M.D.   On: 11/10/2017 08:20   Dg Chest Port 1 View  Result Date: 11/09/2017 CLINICAL DATA:  Respiratory failure. EXAM: PORTABLE CHEST 1 VIEW COMPARISON:  11/09/2017 FINDINGS: Left-sided pacemaker unchanged. Endotracheal tube unchanged. Enteric tube courses into the region of the stomach and off the inferior portion of the film. Right subclavian central venous catheter has tip over the SVC unchanged. Lungs are adequately inflated and demonstrate mild prominence of the perihilar markings suggesting a degree of vascular congestion. No evidence of effusion. Remainder the exam is unchanged. IMPRESSION: Suggestion of mild vascular congestion. Tubes and lines as described. Electronically Signed   By: Marin Olp M.D.   On: 11/09/2017 13:59    Cardiac Studies   Echocardiogram 11/04/2017: Study Conclusions  - Left ventricle: The cavity size was normal. Wall thickness was   normal. Systolic function was moderately to severely reduced. The   estimated ejection fraction was = 30%. - Regional wall motion abnormality: Akinesis of the mid-apical   anterior, mid anteroseptal, apical inferior, apical septal,   apical lateral, and apical myocardium. - Aortic valve: Mildly calcified annulus.  Trileaflet; normal   thickness leaflets. Valve area (VTI): 2.98 cm^2. Valve area   (Vmax): 2.92 cm^2. Valve area (Vmean): 2.61 cm^2. - Mitral valve: Mildly calcified annulus. Normal thickness leaflets  - Tricuspid valve: There was mild-moderate regurgitation. - Technically difficult study. Echcontrast used to Scientist, forensic.  Impressions:  - Acute decrease in LVEF and new wall motion abnormalities since   January 23,2019 study. Pattern of wall motion abnormalities   typical of stress induced cardiomyopathy/Takotsubo syndrome,   however this diagnosis requires exclusing of coronary artery   disease.  Patient Profile     52 y.o. 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 Lovena Le, COPD, previous stroke, and hypertension.  She is now being managed for hypoxic respiratory failure with COPD exacerbation and influenza A/pneumonia.  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  Assessment & Plan    1. NSTEMI, possible type II event with stress-induced cardiomyopathy, peak troponin I 1.68.  2.  Cardiomyopathy with LVEF approximately 30% and periapical wall motion abnormalities suggestive of stress-induced cardiomyopathy.  This represents an acute change compared to echocardiogram from January.  3.  Hypoxic respiratory failure, currently ventilator dependent.  She is being managed by Dr. Luan Pulling.  4.  COPD exacerbation.  5.  Influenza A.  6.  History of inappropriate sinus tachycardia status post sinus node modification and subsequent placement of [employer] pacemaker by Dr. Lovena Le.  7.  History of stroke, she has been on dual antiplatelet therapy as an outpatient.  8.  History of normal coronary arteries at cardiac catheterization in 2002.  I reviewed the initial consultation note by  Dr. Harl Bowie and subsequent hospital course.  Patient continues with supportive measures on the ventilator with management by Pulmonary.  I reviewed the echocardiogram images.  Current regimen includes aspirin, Plavix, Lipitor, and subcutaneous heparin.  She had approximately 500 cc net out more than in last 24 hours.  Chest x-ray shows mild vascular congestion with persistent right lower lobe infiltrate.  Not on standing Lasix at this time, continue to follow intake and output, recent CVP in the 7-8 range.  Signed, Rozann Lesches, MD  11/11/2017, 8:17 AM

## 2017-11-12 ENCOUNTER — Inpatient Hospital Stay (HOSPITAL_COMMUNITY): Payer: Medicaid Other

## 2017-11-12 DIAGNOSIS — Z950 Presence of cardiac pacemaker: Secondary | ICD-10-CM

## 2017-11-12 LAB — COMPREHENSIVE METABOLIC PANEL
ALK PHOS: 100 U/L (ref 38–126)
ALT: 63 U/L — AB (ref 14–54)
AST: 61 U/L — AB (ref 15–41)
Albumin: 2.1 g/dL — ABNORMAL LOW (ref 3.5–5.0)
Anion gap: 10 (ref 5–15)
BUN: 58 mg/dL — AB (ref 6–20)
CALCIUM: 8.4 mg/dL — AB (ref 8.9–10.3)
CHLORIDE: 116 mmol/L — AB (ref 101–111)
CO2: 17 mmol/L — AB (ref 22–32)
CREATININE: 0.71 mg/dL (ref 0.44–1.00)
GFR calc Af Amer: >60 mL/min (ref >60)
Glucose, Bld: 65 mg/dL (ref 65–99)
Potassium: 3.8 mmol/L (ref 3.5–5.1)
Sodium: 143 mmol/L (ref 135–145)
Total Bilirubin: 0.6 mg/dL (ref 0.3–1.2)
Total Protein: 5.5 g/dL — ABNORMAL LOW (ref 6.5–8.1)

## 2017-11-12 LAB — CBC WITH DIFFERENTIAL/PLATELET
BASOS PCT: 0 %
Basophils Absolute: 0 10*3/uL (ref 0.0–0.1)
EOS ABS: 0 10*3/uL (ref 0.0–0.7)
EOS PCT: 0 %
HCT: 38.5 % (ref 36.0–46.0)
Hemoglobin: 12.4 g/dL (ref 12.0–15.0)
LYMPHS ABS: 1.8 10*3/uL (ref 0.7–4.0)
Lymphocytes Relative: 8 %
MCH: 30.3 pg (ref 26.0–34.0)
MCHC: 32.2 g/dL (ref 30.0–36.0)
MCV: 94.1 fL (ref 78.0–100.0)
MONO ABS: 1.4 10*3/uL — AB (ref 0.1–1.0)
Monocytes Relative: 6 %
Neutro Abs: 19.5 10*3/uL — ABNORMAL HIGH (ref 1.7–7.7)
Neutrophils Relative %: 86 %
PLATELETS: 173 10*3/uL (ref 150–400)
RBC: 4.09 MIL/uL (ref 3.87–5.11)
RDW: 15 % (ref 11.5–15.5)
WBC: 22.7 10*3/uL — AB (ref 4.0–10.5)

## 2017-11-12 LAB — GLUCOSE, CAPILLARY
GLUCOSE-CAPILLARY: 132 mg/dL — AB (ref 65–99)
GLUCOSE-CAPILLARY: 186 mg/dL — AB (ref 65–99)
GLUCOSE-CAPILLARY: 142 mg/dL — AB (ref 65–99)
Glucose-Capillary: 123 mg/dL — ABNORMAL HIGH (ref 65–99)
Glucose-Capillary: 73 mg/dL (ref 65–99)
Glucose-Capillary: 75 mg/dL (ref 65–99)

## 2017-11-12 LAB — BLOOD GAS, ARTERIAL
ACID-BASE DEFICIT: 7.5 mmol/L — AB (ref 0.0–2.0)
BICARBONATE: 18.5 mmol/L — AB (ref 20.0–28.0)
Drawn by: 21310
FIO2: 40
LHR: 22 {breaths}/min
MECHVT: 500 mL
O2 Saturation: 94.5 %
PEEP/CPAP: 5 cmH2O
PO2 ART: 86 mmHg (ref 83.0–108.0)
Patient temperature: 37
pCO2 arterial: 35.5 mmHg (ref 32.0–48.0)
pH, Arterial: 7.313 — ABNORMAL LOW (ref 7.350–7.450)

## 2017-11-12 MED ORDER — FUROSEMIDE 10 MG/ML IJ SOLN
40.0000 mg | Freq: Once | INTRAMUSCULAR | Status: AC
  Administered 2017-11-12: 40 mg via INTRAVENOUS
  Filled 2017-11-12: qty 4

## 2017-11-12 MED ORDER — PANTOPRAZOLE SODIUM 40 MG PO PACK
40.0000 mg | PACK | Freq: Every day | ORAL | Status: DC
  Administered 2017-11-12 – 2017-11-20 (×7): 40 mg
  Filled 2017-11-12 (×7): qty 20

## 2017-11-12 MED ORDER — METOPROLOL TARTRATE 25 MG PO TABS
12.5000 mg | ORAL_TABLET | Freq: Two times a day (BID) | ORAL | Status: DC
Start: 2017-11-12 — End: 2017-11-13
  Administered 2017-11-12 (×2): 12.5 mg via ORAL
  Filled 2017-11-12 (×2): qty 1

## 2017-11-12 NOTE — Progress Notes (Signed)
Pharmacy Antibiotic Note  [name] is a 52 y.o. female admitted on 11/18/2017 with pneumonia.  Pharmacy has been consulted for cefepime dosing.  Plan: Continue cefepime 1000 mg IV Q8 hours  Monitor labs,cultures, and patient improvement  Height: 5\' 4"  (162.6 cm) Weight: 202 lb 6.1 oz (91.8 kg) IBW/kg (Calculated) : 54.7  Temp (24hrs), Avg:98.6 F (37 C), Min:97.6 F (36.4 C), Max:99 F (37.2 C)  Recent Labs  Lab 11/06/17 0513 11/07/17 0556 11/08/17 0433 11/08/17 0837 11/09/17 0600 11/10/17 0533 11/11/17 0437 11/12/17 0556  WBC 12.9* 16.6* 22.3*  --  25.5* 24.8*  --  22.7*  CREATININE 1.33* 1.10* 0.95  --  0.87 0.83 0.79 0.71  LATICACIDVEN  --   --   --  1.7  --   --   --   --   VANCOTROUGH 38*  --   --   --   --   --   --   --     Estimated Creatinine Clearance: 91.3 mL/min (by C-G formula based on SCr of 0.71 mg/dL).    Allergies  Allergen Reactions  . Flexeril [Cyclobenzaprine] Hives  . Amoxicillin Hives and Rash    Has patient had a PCN reaction causing immediate rash, facial/tongue/throat swelling, SOB or lightheadedness with hypotension: Yes Has patient had a PCN reaction causing severe rash involving mucus membranes or skin necrosis: Yes Has patient had a PCN reaction that required hospitalization No Has patient had a PCN reaction occurring within the last 10 years: Yes If all of the above answers are "NO", then may proceed with Cephalosporin use.    Antimicrobials this admission: Vanc 2/11 >> 2/14 Levaquin 2/11 >> 2/14 Cefepime 2/12 >> Tamiflu 2/11>>2/16  Dose adjustments this admission: N/A  Microbiology results: 2/11 BCx: ng final 2/12 MRSA PCR:  negative 2/12 influenza + on tamiflu  Thank you for allowing pharmacy to be a part of this patient's care.  Mady Gemma 11/12/2017 12:56 PM

## 2017-11-12 NOTE — Progress Notes (Signed)
Subjective: She was admitted with COPD and developed increasing shortness of breath and came to the emergency department.  She was found to be positive for influenza A.  She had diffuse bilateral infiltrates on chest x-ray and was treated for pneumonia but also had what appeared to be some element of heart failure.  She remains on the ventilator.  She is now on 40% oxygen.  Chest x-ray looks better still with a right lower lobe infiltrate yesterday and today's chest x-ray is pending.  Objective: Vital signs in last 24 hours: Temp:  [98.3 F (36.8 C)-99 F (37.2 C)] 98.7 F (37.1 C) (02/20 0400) Pulse Rate:  [59-101] 78 (02/20 0600) Resp:  [22-32] 22 (02/20 0600) BP: (112-144)/(67-93) 119/79 (02/20 0600) SpO2:  [92 %-98 %] 96 % (02/20 0600) FiO2 (%):  [40 %] 40 % (02/20 0447) Weight:  [91.8 kg (202 lb 6.1 oz)] 91.8 kg (202 lb 6.1 oz) (02/20 0500) Weight change: -3.8 kg (-6 oz) Last BM Date: 11/11/17  Intake/Output from previous day: 02/19 0701 - 02/20 0700 In: 2807.7 [I.V.:588.9; NG/GT:1718.8; IV Piggyback:500] Out: 3500 [Urine:3500]  PHYSICAL EXAM General appearance: Intubated sedated on mechanical ventilation Resp: rhonchi bilaterally Cardio: Heart is regular in mostly paced rhythm on monitor.  No gallop GI: soft, non-tender; bowel sounds normal; no masses,  no organomegaly Extremities: extremities normal, atraumatic, no cyanosis or edema Throat clear except for tubing  Lab Results:  Results for orders placed or performed during the hospital encounter of 10/28/2017 (from the past 48 hour(s))  Glucose, capillary     Status: Abnormal   Collection Time: 11/10/17  8:13 AM  Result Value Ref Range   Glucose-Capillary 216 (H) 65 - 99 mg/dL  .Cooxemetry Panel (carboxy, met, total hgb, O2 sat)     Status: Abnormal   Collection Time: 11/10/17 11:10 AM  Result Value Ref Range   Total hemoglobin 12.5 12.0 - 16.0 g/dL   O2 Saturation 79.8 %   Carboxyhemoglobin 0.8 0.5 - 1.5 %    Methemoglobin 1.9 (H) 0.0 - 1.5 %   Total oxygen content 13.6 (L) 15.0 - 23.0 mL/dL    Comment: Performed at [facility], [address], [place], [place] [address]  Glucose, capillary     Status: Abnormal   Collection Time: 11/10/17 11:27 AM  Result Value Ref Range   Glucose-Capillary 234 (H) 65 - 99 mg/dL  Glucose, capillary     Status: Abnormal   Collection Time: 11/10/17  4:48 PM  Result Value Ref Range   Glucose-Capillary 129 (H) 65 - 99 mg/dL  Glucose, capillary     Status: Abnormal   Collection Time: 11/10/17  8:18 PM  Result Value Ref Range   Glucose-Capillary 119 (H) 65 - 99 mg/dL  Glucose, capillary     Status: Abnormal   Collection Time: 11/10/17 11:21 PM  Result Value Ref Range   Glucose-Capillary 133 (H) 65 - 99 mg/dL  Glucose, capillary     Status: Abnormal   Collection Time: 11/11/17  3:29 AM  Result Value Ref Range   Glucose-Capillary 116 (H) 65 - 99 mg/dL  Draw ABG 1 hour after initiation of ventilator     Status: Abnormal   Collection Time: 11/11/17  3:47 AM  Result Value Ref Range   FIO2 50.00    Delivery systems VENTILATOR    VT 500 mL   LHR 22 resp/min   Peep/cpap 5.0 cm H20   pH, Arterial 7.333 (L) 7.350 - 7.450   pCO2 arterial 34.0  32.0 - 48.0 mmHg   pO2, Arterial 95.2 83.0 - 108.0 mmHg   Bicarbonate 18.7 (L) 20.0 - 28.0 mmol/L   Acid-base deficit 7.2 (H) 0.0 - 2.0 mmol/L   O2 Saturation 96.2 %   Patient temperature 37.0    Collection site LEFT RADIAL    Drawn by [id number]    Sample type ARTERIAL DRAW    Allens test (pass/fail) PASS PASS    Comment: Performed at [facility], [address], [place], [place] [address]  .Cooxemetry Panel (carboxy, met, total hgb, O2 sat)     Status: None   Collection Time: 11/11/17  3:47 AM  Result Value Ref Range   Total hemoglobin 13.2 12.0 - 16.0 g/dL   O2 Saturation 96.2 %   Carboxyhemoglobin 0.5 0.5 - 1.5 %   Methemoglobin 1.4 0.0 - 1.5 %   Total oxygen content 17.6 15.0 - 23.0 mL/dL    Comment: Performed at  [facility], [address] [place], [place] [address]  Comprehensive metabolic panel     Status: Abnormal   Collection Time: 11/11/17  4:37 AM  Result Value Ref Range   Sodium 146 (H) 135 - 145 mmol/L   Potassium 3.9 3.5 - 5.1 mmol/L   Chloride 120 (H) 101 - 111 mmol/L   CO2 17 (L) 22 - 32 mmol/L   Glucose, Bld 124 (H) 65 - 99 mg/dL   BUN 64 (H) 6 - 20 mg/dL   Creatinine, Ser 0.79 0.44 - 1.00 mg/dL   Calcium 8.5 (L) 8.9 - 10.3 mg/dL   Total Protein 5.6 (L) 6.5 - 8.1 g/dL   Albumin 2.1 (L) 3.5 - 5.0 g/dL   AST 63 (H) 15 - 41 U/L   ALT 53 14 - 54 U/L   Alkaline Phosphatase 107 38 - 126 U/L   Total Bilirubin 0.3 0.3 - 1.2 mg/dL   GFR calc non Af Amer >60 >60 mL/min   GFR calc Af Amer >60 >60 mL/min    Comment: (NOTE) The eGFR has been calculated using the CKD EPI equation. This calculation has not been validated in all clinical situations. eGFR's persistently <60 mL/min signify possible Chronic Kidney Disease.    Anion gap 9 5 - 15    Comment: Performed at [facility], [address], [place], [place] [address]  Triglycerides     Status: Abnormal   Collection Time: 11/11/17  4:37 AM  Result Value Ref Range   Triglycerides 329 (H) <150 mg/dL    Comment: Performed at [facility], [address] [place], [place] [address]  Glucose, capillary     Status: Abnormal   Collection Time: 11/11/17  8:10 AM  Result Value Ref Range   Glucose-Capillary 108 (H) 65 - 99 mg/dL  Glucose, capillary     Status: Abnormal   Collection Time: 11/11/17 12:06 PM  Result Value Ref Range   Glucose-Capillary 154 (H) 65 - 99 mg/dL  Glucose, capillary     Status: Abnormal   Collection Time: 11/11/17  4:03 PM  Result Value Ref Range   Glucose-Capillary 124 (H) 65 - 99 mg/dL  Glucose, capillary     Status: Abnormal   Collection Time: 11/11/17  4:58 PM  Result Value Ref Range   Glucose-Capillary 151 (H) 65 - 99 mg/dL  Glucose, capillary     Status: Abnormal   Collection Time: 11/11/17  8:09 PM   Result Value Ref Range   Glucose-Capillary 158 (H) 65 - 99 mg/dL  Glucose, capillary  Status: Abnormal   Collection Time: 11/12/17 12:14 AM  Result Value Ref Range   Glucose-Capillary 123 (H) 65 - 99 mg/dL  Glucose, capillary     Status: None   Collection Time: 11/12/17  4:38 AM  Result Value Ref Range   Glucose-Capillary 75 65 - 99 mg/dL  Draw ABG 1 hour after initiation of ventilator     Status: Abnormal   Collection Time: 11/12/17  4:55 AM  Result Value Ref Range   FIO2 40.00    Delivery systems VENTILATOR    Mode ASSIST CONTROL    VT 500 mL   LHR 22 resp/min   Peep/cpap 5.0 cm H20   pH, Arterial 7.313 (L) 7.350 - 7.450   pCO2 arterial 35.5 32.0 - 48.0 mmHg   pO2, Arterial 86.0 83.0 - 108.0 mmHg   Bicarbonate 18.5 (L) 20.0 - 28.0 mmol/L   Acid-base deficit 7.5 (H) 0.0 - 2.0 mmol/L   O2 Saturation 94.5 %   Patient temperature 37.0    Collection site LEFT RADIAL    Drawn by [id number]    Sample type ARTERIAL    Allens test (pass/fail) PASS PASS    Comment: Performed at [facility], [address] [place], [place] [address]  Comprehensive metabolic panel     Status: Abnormal   Collection Time: 11/12/17  5:56 AM  Result Value Ref Range   Sodium 143 135 - 145 mmol/L   Potassium 3.8 3.5 - 5.1 mmol/L   Chloride 116 (H) 101 - 111 mmol/L   CO2 17 (L) 22 - 32 mmol/L   Glucose, Bld 65 65 - 99 mg/dL   BUN 58 (H) 6 - 20 mg/dL   Creatinine, Ser 0.71 0.44 - 1.00 mg/dL   Calcium 8.4 (L) 8.9 - 10.3 mg/dL   Total Protein 5.5 (L) 6.5 - 8.1 g/dL   Albumin 2.1 (L) 3.5 - 5.0 g/dL   AST 61 (H) 15 - 41 U/L   ALT 63 (H) 14 - 54 U/L   Alkaline Phosphatase 100 38 - 126 U/L   Total Bilirubin 0.6 0.3 - 1.2 mg/dL   GFR calc non Af Amer >60 >60 mL/min   GFR calc Af Amer >60 >60 mL/min    Comment: (NOTE) The eGFR has been calculated using the CKD EPI equation. This calculation has not been validated in all clinical situations. eGFR's persistently <60 mL/min signify possible Chronic  Kidney Disease.    Anion gap 10 5 - 15    Comment: Performed at [facility], [address] [place], [place] [address]  CBC with Differential/Platelet     Status: Abnormal (Preliminary result)   Collection Time: 11/12/17  5:56 AM  Result Value Ref Range   WBC 22.7 (H) 4.0 - 10.5 K/uL   RBC 4.09 3.87 - 5.11 MIL/uL   Hemoglobin 12.4 12.0 - 15.0 g/dL   HCT 38.5 36.0 - 46.0 %   MCV 94.1 78.0 - 100.0 fL   MCH 30.3 26.0 - 34.0 pg   MCHC 32.2 30.0 - 36.0 g/dL   RDW 15.0 11.5 - 15.5 %   Platelets 173 150 - 400 K/uL    Comment: Performed at [facility], [address] [place], [place] [address]   Neutrophils Relative % PENDING %   Neutro Abs PENDING 1.7 - 7.7 K/uL   Band Neutrophils PENDING %   Lymphocytes Relative PENDING %   Lymphs Abs PENDING 0.7 - 4.0 K/uL   Monocytes Relative PENDING %   Monocytes Absolute PENDING  0.1 - 1.0 K/uL   Eosinophils Relative PENDING %   Eosinophils Absolute PENDING 0.0 - 0.7 K/uL   Basophils Relative PENDING %   Basophils Absolute PENDING 0.0 - 0.1 K/uL   WBC Morphology PENDING    RBC Morphology PENDING    Smear Review PENDING    nRBC PENDING 0 /100 WBC   Metamyelocytes Relative PENDING %   Myelocytes PENDING %   Promyelocytes Absolute PENDING %   Blasts PENDING %    ABGS Recent Labs    11/12/17 0455  PHART 7.313*  PO2ART 86.0  HCO3 18.5*   CULTURES Recent Results (from the past 240 hour(s))  Blood Culture (routine x 2)     Status: None   Collection Time: 10/24/2017  6:27 PM  Result Value Ref Range Status   Specimen Description RIGHT ANTECUBITAL  Final   Special Requests   Final    BOTTLES DRAWN AEROBIC AND ANAEROBIC Blood Culture adequate volume   Culture   Final    NO GROWTH 5 DAYS Performed at [facility], [address], [place], [place] [address]    Report Status 11/08/2017 FINAL  Final  Blood Culture (routine x 2)     Status: None   Collection Time: 11/14/2017  6:27 PM  Result Value Ref Range Status   Specimen Description RIGHT  ANTECUBITAL  Final   Special Requests   Final    BOTTLES DRAWN AEROBIC AND ANAEROBIC Blood Culture adequate volume   Culture   Final    NO GROWTH 5 DAYS Performed at [facility], [address] [place], [place] [address]    Report Status 11/08/2017 FINAL  Final  MRSA PCR Screening     Status: None   Collection Time: 11/04/17  1:26 AM  Result Value Ref Range Status   MRSA by PCR NEGATIVE NEGATIVE Final    Comment:        The GeneXpert MRSA Assay (FDA approved for NASAL specimens only), is one component of a comprehensive MRSA colonization surveillance program. It is not intended to diagnose MRSA infection nor to guide or monitor treatment for MRSA infections. Performed at [facility], [address], [place], [place] [address]    Studies/Results: Dg Chest Port 1 View  Result Date: 11/11/2017 CLINICAL DATA:  Respiratory failure, COPD EXAM: PORTABLE CHEST 1 VIEW COMPARISON:  11/10/2017 FINDINGS: Left pacer remains in place, unchanged. Cardiomegaly with vascular congestion. Right lower lobe airspace opacity is unchanged. Possible small right effusion. IMPRESSION: Continued right lower lobe infiltrate with small right effusion. Mild cardiomegaly, vascular congestion. Electronically Signed   By: Rolm Baptise M.D.   On: 11/11/2017 08:07   Dg Chest Port 1 View  Result Date: 11/10/2017 CLINICAL DATA:  Respiratory failure EXAM: PORTABLE CHEST 1 VIEW COMPARISON:  11/09/2017 FINDINGS: Endotracheal tube in good position. NG tube in place. Dual lead pacemaker unchanged. Progression of hazy bilateral airspace disease which may represent edema. Mild bibasilar atelectasis also has progressed. Hypoventilation has progressed. IMPRESSION: Endotracheal tube in good position Hypoventilation with increased atelectasis in the bases. Progression of diffuse bilateral airspace disease suggestive of edema. Electronically Signed   By: Franchot Gallo M.D.   On: 11/10/2017 08:20    Medications:  Prior to  Admission:  Medications Prior to Admission  Medication Sig Dispense Refill Last Dose  . acetaminophen (TYLENOL) 500 MG tablet Take 500 mg by mouth every 6 (six) hours as needed for mild pain or moderate pain.   11/02/2017 at Unknown time  . aspirin EC  81 MG tablet Take 81 mg by mouth daily.   11/17/2017 at Unknown time  . carvedilol (COREG) 6.25 MG tablet Take 1 tablet (6.25 mg total) by mouth 2 (two) times daily. 180 tablet 3 11/08/2017 at Unknown time  . Cholecalciferol (VITAMIN D3) 5000 units CAPS Take 1 capsule (5,000 Units total) by mouth daily. 90 capsule 0 10/30/2017 at Unknown time  . clopidogrel (PLAVIX) 75 MG tablet Take 75 mg by mouth daily.   10/31/2017 at Unknown time  . diclofenac sodium (VOLTAREN) 1 % GEL Apply 4 g topically 4 (four) times daily. (Patient taking differently: Apply 2-4 g topically daily as needed (for leg pain). ) 200 g 11 Past Month at Unknown time  . dicyclomine (BENTYL) 20 MG tablet Take 1 tablet (20 mg total) by mouth every 8 (eight) hours as needed for spasms. 90 tablet 3 11/04/2017 at Unknown time  . diphenoxylate-atropine (LOMOTIL) 2.5-0.025 MG tablet Take 2 tabs by mouth x 1, then 1 tab every 4 hrs PRN, max of 8 tabs in 24 hour time 240 tablet 1 Past Week at Unknown time  . DULoxetine (CYMBALTA) 30 MG capsule Take 90 mg daily (60 mg + 30 mg) (Patient taking differently: Take 30 mg by mouth daily. ) 30 capsule 0 11/10/2017 at Unknown time  . DULoxetine (CYMBALTA) 60 MG capsule Take 90 mg daily (60 mg + 30 mg) 30 capsule 0 11/10/2017 at Unknown time  . furosemide (LASIX) 40 MG tablet TAKE (1) TABLET BY MOUTH EACH MORNING. 90 tablet 3 11/02/2017 at Unknown time  . ibuprofen (ADVIL,MOTRIN) 600 MG tablet Take 600 mg by mouth 2 (two) times daily as needed for moderate pain.    11/02/2017 at Unknown time  . insulin aspart (NOVOLOG FLEXPEN) 100 UNIT/ML FlexPen Inject 15-21 Units into the skin 3 (three) times daily with meals. 5 pen 2 10/30/2017 at Unknown time  . INVOKANA 100 MG  TABS tablet TAKE 1 TABLET BY MOUTH DAILY BEFORE BREAKFAST. 30 tablet 2 11/10/2017 at Unknown time  . LEVEMIR FLEXTOUCH 100 UNIT/ML Pen INJECT 80 UNITS UNDER THE SKIN EVERY DAY AT 10 PM 30 mL 2 11/02/2017 at Unknown time  . lisinopril (PRINIVIL,ZESTRIL) 5 MG tablet Take 5 mg by mouth daily.   11/02/2017 at Unknown time  . LORazepam (ATIVAN) 0.5 MG tablet Take 1 tablet (0.5 mg total) by mouth daily as needed for anxiety. 30 tablet 0 Past Week at Unknown time  . LYRICA 150 MG capsule take 172m by mouth three times daily  0 11/17/2017 at Unknown time  . meloxicam (MOBIC) 15 MG tablet Take 15 mg by mouth daily.   11/13/2017 at Unknown time  . metFORMIN (GLUCOPHAGE) 1000 MG tablet TAKE 1 TABLET(1000 MG) BY MOUTH TWICE DAILY WITH A MEAL 60 tablet 3 11/17/2017 at Unknown time  . nitroGLYCERIN (NITROSTAT) 0.4 MG SL tablet Place 1 tablet (0.4 mg total) under the tongue every 5 (five) minutes as needed for chest pain. 30 tablet 0 unknown  . pantoprazole (PROTONIX) 40 MG tablet Take 1 tablet (40 mg total) by mouth 2 (two) times daily. 60 tablet 0 11/13/2017 at Unknown time  . potassium chloride (K-DUR) 10 MEQ tablet Take 1 tablet (10 mEq total) by mouth daily. 90 tablet 3 11/01/2017 at Unknown time  . pramipexole (MIRAPEX) 0.25 MG tablet Take 0.25 mg by mouth 2 (two) times daily.    11/10/2017 at Unknown time  . simvastatin (ZOCOR) 10 MG tablet TAKE (1) TABLET BY MOUTH DAILY. (Patient taking differently: TAKE (  2) TABLET BY MOUTH DAILY.) 90 tablet 2 11/02/2017 at Unknown time  . tapentadol (NUCYNTA ER) 100 MG 12 hr tablet Take 100 mg by mouth every 12 (twelve) hours.   11/02/2017 at Unknown time  . tiZANidine (ZANAFLEX) 4 MG capsule Take 4 mg by mouth 3 (three) times daily.   10/25/2017 at Unknown time  . albuterol (PROVENTIL) (2.5 MG/3ML) 0.083% nebulizer solution Take 3 mLs (2.5 mg total) by nebulization every 6 (six) hours as needed for wheezing or shortness of breath. 75 mL 12 unknown  . colestipol (COLESTID) 1 g tablet  Take 1 tablet (1 g total) by mouth 2 (two) times daily. (Patient not taking: Reported on 11/10/2017) 90 tablet 3 unknown  . inFLIXimab 500 mg in sodium chloride 0.9 % 200 mL Inject 500 mg into the vein every 8 (eight) weeks. 1 Dose 6 Taking  . OXYGEN Inhale 3 L/day into the lungs at bedtime.   Taking   Scheduled: . aspirin EC  81 mg Oral Daily  . atorvastatin  80 mg Oral q1800  . budesonide (PULMICORT) nebulizer solution  0.5 mg Nebulization BID  . chlorhexidine gluconate (MEDLINE KIT)  15 mL Mouth Rinse BID  . Chlorhexidine Gluconate Cloth  6 each Topical Daily  . clopidogrel  75 mg Oral Daily  . feeding supplement (PRO-STAT SUGAR FREE 64)  60 mL Per Tube TID  . feeding supplement (VITAL HIGH PROTEIN)  1,000 mL Per Tube Q24H  . heparin injection (subcutaneous)  5,000 Units Subcutaneous Q8H  . insulin aspart  0-20 Units Subcutaneous Q4H  . insulin aspart  16 Units Subcutaneous Q4H  . insulin glargine  90 Units Subcutaneous Daily  . ipratropium  0.5 mg Nebulization Q6H  . levalbuterol  0.63 mg Nebulization Q6H  . mouth rinse  15 mL Mouth Rinse 10 times per day  . methylPREDNISolone (SOLU-MEDROL) injection  40 mg Intravenous Q8H  . pantoprazole (PROTONIX) IV  40 mg Intravenous QHS  . potassium chloride  10 mEq Oral Daily  . sodium chloride flush  10-40 mL Intracatheter Q12H  . tapentadol  100 mg Oral Q12H   Continuous: . propofol (DIPRIVAN) infusion 50 mcg/kg/min (11/12/17 0510)   [facility]:[id number] **OR** acetaminophen, fentaNYL (SUBLIMAZE) injection, fentaNYL (SUBLIMAZE) injection, levalbuterol, LORazepam, sodium chloride flush  Assesment: She was admitted with acute second hypercapnic respiratory failure.  She is intubated.  We have been able to reduce her FiO2 now to 40%.  She has influenza A complicated by bilateral pneumonia for which she is being treated with antibiotics  She has COPD exacerbation and apparently ran out of her medications for her breathing prior to admission.   She is on steroids and bronchodilators  She has acute systolic heart failure.  She had what appears to be a non-STEMI related to stress.  Blood gas this morning shows a relatively low pH at 7.31 and her bicarbonate is also somewhat low on.  She had been in DKA but does not appear to be having that at this point. Active Problems:   DM type 2 causing vascular disease ([place])   Current smoker   ESOPHAGEAL STRICTURE   Esophageal reflux   Gastroparesis   Cardiac pacemaker in situ   COPD GOLD 0 / still smoking    OSA (obstructive sleep apnea)   Diabetic neuropathy (HCC)   Dysphagia   Crohn's disease without complication (HCC)   COPD exacerbation (HCC)   Influenza A   Acute respiratory failure with hypoxia (HCC)   Hyponatremia   Hypokalemia  Plan: Okay to try weaning today.  Doubt she is going to come off today    LOS: 9 days   Aaima Gaddie L 11/12/2017, 7:30 AM

## 2017-11-12 NOTE — Progress Notes (Signed)
Progress Note  Patient Name: [name] Date of Encounter: 11/12/2017  Primary Cardiologist: Dr. Satira Sark  Subjective   Patient remains sedated on the ventilator.  Inpatient Medications    Scheduled Meds: . aspirin EC  81 mg Oral Daily  . atorvastatin  80 mg Oral q1800  . budesonide (PULMICORT) nebulizer solution  0.5 mg Nebulization BID  . chlorhexidine gluconate (MEDLINE KIT)  15 mL Mouth Rinse BID  . Chlorhexidine Gluconate Cloth  6 each Topical Daily  . clopidogrel  75 mg Oral Daily  . feeding supplement (PRO-STAT SUGAR FREE 64)  60 mL Per Tube TID  . feeding supplement (VITAL HIGH PROTEIN)  1,000 mL Per Tube Q24H  . heparin injection (subcutaneous)  5,000 Units Subcutaneous Q8H  . insulin aspart  0-20 Units Subcutaneous Q4H  . insulin aspart  16 Units Subcutaneous Q4H  . insulin glargine  90 Units Subcutaneous Daily  . ipratropium  0.5 mg Nebulization Q6H  . levalbuterol  0.63 mg Nebulization Q6H  . mouth rinse  15 mL Mouth Rinse 10 times per day  . methylPREDNISolone (SOLU-MEDROL) injection  40 mg Intravenous Q8H  . pantoprazole (PROTONIX) IV  40 mg Intravenous QHS  . potassium chloride  10 mEq Oral Daily  . sodium chloride flush  10-40 mL Intracatheter Q12H  . tapentadol  100 mg Oral Q12H   Continuous Infusions: . propofol (DIPRIVAN) infusion 50 mcg/kg/min (11/12/17 0510)   PRN Meds: acetaminophen **OR** acetaminophen, fentaNYL (SUBLIMAZE) injection, fentaNYL (SUBLIMAZE) injection, levalbuterol, LORazepam, sodium chloride flush   Vital Signs    Vitals:   11/12/17 0530 11/12/17 0600 11/12/17 0742 11/12/17 0808  BP: 121/76 119/79    Pulse: 67 78  87  Resp: (!) 23 (!) 22  (!) 23  Temp:    97.6 F (36.4 C)  TempSrc:    Axillary  SpO2: 94% 96% 97% 96%  Weight:      Height:        Intake/Output Summary (Last 24 hours) at 11/12/2017 0857 Last data filed at 11/12/2017 0600 Gross per 24 hour  Intake 2775.02 ml  Output 3500 ml  Net -724.98 ml     Filed Weights   11/11/17 0430 11/11/17 0500 11/12/17 0500  Weight: 210 lb 12.2 oz (95.6 kg) 210 lb 12.2 oz (95.6 kg) 202 lb 6.1 oz (91.8 kg)    Telemetry    Sinus rhythm and sinus tachycardia, bursts of PAT, occasional PVCs, personally reviewed.  Physical Exam   GEN:  Sedated, no acute distress.   Neck: No JVD. Cardiac: RRR, no gallop.  Respiratory: Scattered rhonchi. GI: Soft, nontender, bowel sounds present. MS: SCDs in place. No deformity. Neuro:  Patient sedated on propofol. Psych: Patient sedated on propofol.  Labs    Chemistry Recent Labs  Lab 11/10/17 0533 11/11/17 0437 11/12/17 0556  NA 150* 146* 143  K 3.8 3.9 3.8  CL 122* 120* 116*  CO2 16* 17* 17*  GLUCOSE 250* 124* 65  BUN 71* 64* 58*  CREATININE 0.83 0.79 0.71  CALCIUM 8.6* 8.5* 8.4*  PROT 5.5* 5.6* 5.5*  ALBUMIN 2.0* 2.1* 2.1*  AST 53* 63* 61*  ALT 41 53 63*  ALKPHOS 102 107 100  BILITOT 0.4 0.3 0.6  GFRNONAA >60 >60 >60  GFRAA >60 >60 >60  ANIONGAP 12 9 10      Hematology Recent Labs  Lab 11/09/17 0600 11/10/17 0533 11/12/17 0556  WBC 25.5* 24.8* 22.7*  RBC 4.04 4.16 4.09  HGB 12.3 12.5  12.4  HCT 37.4 39.5 38.5  MCV 92.6 95.0 94.1  MCH 30.4 30.0 30.3  MCHC 32.9 31.6 32.2  RDW 14.9 15.6* 15.0  PLT 199 219 173    Cardiac Enzymes Recent Labs  Lab 11/05/17 0909 11/05/17 1439 11/05/17 2036 11/06/17 0909  TROPONINI 1.18* 1.30* 1.68* 1.47*   No results for input(s): TROPIPOC in the last 168 hours.    Radiology    Dg Chest 1 View  Result Date: 11/12/2017 CLINICAL DATA:  Followup ventilator support. EXAM: CHEST 1 VIEW COMPARISON:  11/11/2017 FINDINGS: Endotracheal tube tip is 4 cm above the carina. Nasogastric tube enters the abdomen. There continues to be moderate atelectasis in both lower lungs right more than left with possible fluid overload but no frank edema. IMPRESSION: Persistent lower lobe volume loss right more than left. Electronically Signed   By: Nelson Chimes M.D.    On: 11/12/2017 08:51   Dg Chest Port 1 View  Result Date: 11/11/2017 CLINICAL DATA:  Respiratory failure, COPD EXAM: PORTABLE CHEST 1 VIEW COMPARISON:  11/10/2017 FINDINGS: Left pacer remains in place, unchanged. Cardiomegaly with vascular congestion. Right lower lobe airspace opacity is unchanged. Possible small right effusion. IMPRESSION: Continued right lower lobe infiltrate with small right effusion. Mild cardiomegaly, vascular congestion. Electronically Signed   By: Rolm Baptise M.D.   On: 11/11/2017 08:07    Cardiac Studies   Echocardiogram 11/04/2017: Study Conclusions  - Left ventricle: The cavity size was normal. Wall thickness was normal. Systolic function was moderately to severely reduced. The estimated ejection fraction was = 30%. - Regional wall motion abnormality: Akinesis of the mid-apical anterior, mid anteroseptal, apical inferior, apical septal, apical lateral, and apical myocardium. - Aortic valve: Mildly calcified annulus. Trileaflet; normal thickness leaflets. Valve area (VTI): 2.98 cm^2. Valve area (Vmax): 2.92 cm^2. Valve area (Vmean): 2.61 cm^2. - Mitral valve: Mildly calcified annulus. Normal thickness leaflets - Tricuspid valve: There was mild-moderate regurgitation. - Technically difficult study. Echcontrast used to Engineer, water.  Impressions:  - Acute decrease in LVEF and new wall motion abnormalities since January 23,2019 study. Pattern of wall motion abnormalities typical of stress induced cardiomyopathy/Takotsubo syndrome, however this diagnosis requires exclusing of coronary artery disease.  Patient Profile     52 y.o. 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 Lovena Le, COPD, previous stroke, and hypertension.  She is now being managed for hypoxic respiratory failure with COPD exacerbation and influenza A/pneumonia.  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  Assessment & Plan    1.  NSTEMI, likely type II event with stress-induced cardiomyopathy, peak troponin I 1.68. On ASA, Plavix, and Lipitor.  2.  Cardiomyopathy with LVEF approximately 30% and periapical wall motion abnormalities suggestive of stress-induced cardiomyopathy.  This represents an acute change compared to echocardiogram from January.  3.  Hypoxic respiratory failure, currently ventilator dependent.  She is being managed by Dr. Luan Pulling.  4.  COPD exacerbation.  5.  Influenza A.  6.  History of inappropriate sinus tachycardia status post sinus node modification and subsequent placement of [employer] pacemaker by Dr. Lovena Le.  7.  History of stroke, she has been on dual antiplatelet therapy as an outpatient.   8.  History of normal coronary arteries at cardiac catheterization in 2002.  No significant improvement overall. Continue ASA, Plavix, and Lipitor. Add low dose Lopressor. CVP 6-7 and net output approximately  700 cc last 24 hours. Still with some fluid overload by CXR. Will give dose of IV Lasix today. Continue supportive measures.  Signed, Rozann Lesches, MD  11/12/2017, 8:57 AM

## 2017-11-12 NOTE — Progress Notes (Signed)
PROGRESS NOTE    [name]  WLS:[id number] DOB: [birth year] DOA: 11/18/2017 PCP: [facility] [place]     Brief Narrative:  52 year old woman admitted from home on 2/11 due to shortness of breath.  She apparently ran out of her breathing treatments, began to become short of breath a few days prior to admission and also developed a cough with yellow sputum.  In the ED she was found to be positive for influenza A O2 sats were in the 80s.  Of note patient was also found to be in DKA and 2D echo showed an ejection fraction of 30% with concerns for Takotsubo cardiomyopathy.  She was subsequently intubated.   Assessment & Plan:   Active Problems:   DM type 2 causing vascular disease ([place])   Current smoker   ESOPHAGEAL STRICTURE   Esophageal reflux   Gastroparesis   Cardiac pacemaker in situ   COPD GOLD 0 / still smoking    OSA (obstructive sleep apnea)   Diabetic neuropathy ([facility])   Dysphagia   Crohn's disease without complication ([facility])   COPD exacerbation ([facility])   Influenza A   Acute respiratory failure with hypoxia ([facility])   Hyponatremia   Hypokalemia   Acute on chronic hypoxemic respiratory failure, ventilatory dependent -Presumed due to a combination of influenza A, pneumonia as well as COPD with exacerbation. -Continue ventilatory support, will initiate weaning today, appreciate Dr. Luan Pulling input and recommendations. -Continue cefepime for planned duration of 8 days. -Continue steroids, nebs. -Completed course of Tamiflu for influenza A.  Sepsis -Due to above, sepsis parameters improving, continue supportive care-ventilator management.  DKA -Resolved. -In the setting of steroid use and infection and possibly Invokana. -She has now been transitioned to sliding scale with Lantus and is well controlled.  Non-STEMI -Troponin peaked at 1.68.   -continue aspirin, Plavix, Lipitor. -Reviewed echo from February 12: Ejection fraction of 30% with akinesis of the mid  apical anterior, mid anteroseptal, apical inferior, apical septal, apical lateral and apical myocardium. 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 -Cardiology is involved. -Low-dose Lopressor has been added today due to improvement in blood pressure. -Cardiology has given a dose of IV Lasix today given some fluid overload noted on chest x-ray.  Hypernatremia -Improving with free water.   DVT prophylaxis: Subcutaneous heparin Code Status: Full code Family Communication: No family at bedside Disposition Plan: Continue weaning efforts  Consultants:   Cardiology  Pulmonology  Procedures:   Echo as above  Antimicrobials:  Anti-infectives (From admission, onward)   Start     Dose/Rate Route Frequency Ordered Stop   11/04/17 2000  levofloxacin (LEVAQUIN) IVPB 750 mg  Status:  Discontinued     750 mg 100 mL/hr over 90 Minutes Intravenous Every 24 hours 10/24/2017 2041 11/06/17 0939   11/04/17 1000  ceFEPIme (MAXIPIME) 1 g in sodium chloride 0.9 % 100 mL IVPB     1 g 200 mL/hr over 30 Minutes Intravenous Every 8 hours 11/04/17 0814 11/12/17 0243   11/04/17 0500  vancomycin (VANCOCIN) 1,250 mg in sodium chloride 0.9 % 250 mL IVPB  Status:  Discontinued     1,250 mg 166.7 mL/hr over 90 Minutes Intravenous Every 12 hours 11/08/2017 2041 11/06/17 0939   11/01/2017 2345  oseltamivir (TAMIFLU) capsule 75 mg     75 mg Oral 2 times daily 10/31/2017 2336 11/08/17 1135   10/30/2017 2000  oseltamivir (TAMIFLU) capsule 75 mg     75 mg Oral  Once 11/13/2017 1952 11/05/2017  2010   11/18/2017 1800  vancomycin (VANCOCIN) IVPB 1000 mg/200 mL premix     1,000 mg 200 mL/hr over 60 Minutes Intravenous  Once 10/31/2017 1755 11/13/2017 1957   11/14/2017 1800  levofloxacin (LEVAQUIN) IVPB 750 mg     750 mg 100 mL/hr over 90 Minutes Intravenous  Once 11/17/2017 1755 10/25/2017 1957       Subjective: In bed, intubated, sedated  Objective: Vitals:   11/12/17 0800 11/12/17 0808 11/12/17 0900  11/12/17 1224  BP: 133/90  (!) 139/107   Pulse: 85 87 85   Resp: (!) 25 (!) 23 (!) 26   Temp:  97.6 F (36.4 C)    TempSrc:  Axillary    SpO2: 94% 96% 97% 98%  Weight:      Height:        Intake/Output Summary (Last 24 hours) at 11/12/2017 1238 Last data filed at 11/12/2017 1206 Gross per 24 hour  Intake 2507.29 ml  Output 3501 ml  Net -993.71 ml   Filed Weights   11/11/17 0430 11/11/17 0500 11/12/17 0500  Weight: 95.6 kg (210 lb 12.2 oz) 95.6 kg (210 lb 12.2 oz) 91.8 kg (202 lb 6.1 oz)    Examination:  General exam: Sedated, currently undergoing spontaneous breathing trial, morbidly obese Respiratory system: Clear, normal respiratory effort, no wheezes or rhonchi Cardiovascular system:RRR. No murmurs, rubs, gallops. Gastrointestinal system: Abdomen is nondistended, soft and nontender. No organomegaly or masses felt. Normal bowel sounds heard. Central nervous system: Aler unable to assess given current level of sedation Extremities: 1+ pitting edema bilaterally Psychiatry: Unable to assess given current mental state    Data Reviewed: I have personally reviewed following labs and imaging studies  CBC: Recent Labs  Lab 11/07/17 0556 11/08/17 0433 11/09/17 0600 11/10/17 0533 11/12/17 0556  WBC 16.6* 22.3* 25.5* 24.8* 22.7*  NEUTROABS  --   --   --   --  19.5*  HGB 12.0 12.6 12.3 12.5 12.4  HCT 37.1 38.2 37.4 39.5 38.5  MCV 93.9 93.9 92.6 95.0 94.1  PLT 185 201 199 219 650   Basic Metabolic Panel: Recent Labs  Lab 11/06/17 0610 11/07/17 0556 11/08/17 0433 11/09/17 0600 11/10/17 0533 11/11/17 0437 11/12/17 0556  NA  --  141 141 146* 150* 146* 143  K  --  3.3* 3.6 3.8 3.8 3.9 3.8  CL  --  114* 116* 120* 122* 120* 116*  CO2  --  17* 16* 17* 16* 17* 17*  GLUCOSE  --  146* 181* 174* 250* 124* 65  BUN  --  55* 57* 62* 71* 64* 58*  CREATININE  --  1.10* 0.95 0.87 0.83 0.79 0.71  CALCIUM  --  8.7* 8.5* 8.5* 8.6* 8.5* 8.4*  MG 1.9 2.2  --   --   --   --   --      GFR: Estimated Creatinine Clearance: 91.3 mL/min (by C-G formula based on SCr of 0.71 mg/dL). Liver Function Tests: Recent Labs  Lab 11/09/17 0600 11/10/17 0533 11/11/17 0437 11/12/17 0556  AST 34 53* 63* 61*  ALT 27 41 53 63*  ALKPHOS 64 102 107 100  BILITOT 0.7 0.4 0.3 0.6  PROT 5.9* 5.5* 5.6* 5.5*  ALBUMIN 2.1* 2.0* 2.1* 2.1*   No results for input(s): LIPASE, AMYLASE in the last 168 hours. No results for input(s): AMMONIA in the last 168 hours. Coagulation Profile: No results for input(s): INR, PROTIME in the last 168 hours. Cardiac Enzymes: Recent Labs  Lab 11/05/17  1439 11/05/17 2036 11/06/17 0909  TROPONINI 1.30* 1.68* 1.47*   BNP (last 3 results) No results for input(s): PROBNP in the last 8760 hours. HbA1C: No results for input(s): HGBA1C in the last 72 hours. CBG: Recent Labs  Lab 11/11/17 2009 11/12/17 0014 11/12/17 0438 11/12/17 0805 11/12/17 1113  GLUCAP 158* 123* 75 73 132*   Lipid Profile: Recent Labs    11/10/17 0533 11/11/17 0437  TRIG 414* 329*   Thyroid Function Tests: No results for input(s): TSH, T4TOTAL, FREET4, T3FREE, THYROIDAB in the last 72 hours. Anemia Panel: No results for input(s): VITAMINB12, FOLATE, FERRITIN, TIBC, IRON, RETICCTPCT in the last 72 hours. Urine analysis:    Component Value Date/Time   COLORURINE YELLOW 11/10/2017 2130   APPEARANCEUR CLOUDY (A) 10/29/2017 2130   LABSPEC 1.022 10/29/2017 2130   PHURINE 6.0 11/09/2017 2130   GLUCOSEU >=500 (A) 11/19/2017 2130   HGBUR SMALL (A) 11/20/2017 2130   BILIRUBINUR NEGATIVE 11/20/2017 2130   BILIRUBINUR neg 03/23/2015 1735   KETONESUR 20 (A) 10/30/2017 2130   PROTEINUR 100 (A) 11/08/2017 2130   UROBILINOGEN 0.2 04/01/2015 0633   NITRITE NEGATIVE 11/17/2017 2130   LEUKOCYTESUR NEGATIVE 11/05/2017 2130   Sepsis Labs: _0 (procalcitonin:4,lacticidven:4)  ) Recent Results (from the past 240 hour(s))  Blood Culture (routine x 2)     Status: None    Collection Time: 11/02/2017  6:27 PM  Result Value Ref Range Status   Specimen Description RIGHT ANTECUBITAL  Final   Special Requests   Final    BOTTLES DRAWN AEROBIC AND ANAEROBIC Blood Culture adequate volume   Culture   Final    NO GROWTH 5 DAYS Performed at [facility], [address], [place], [place] [address]    Report Status 11/08/2017 FINAL  Final  Blood Culture (routine x 2)     Status: None   Collection Time: 10/30/2017  6:27 PM  Result Value Ref Range Status   Specimen Description RIGHT ANTECUBITAL  Final   Special Requests   Final    BOTTLES DRAWN AEROBIC AND ANAEROBIC Blood Culture adequate volume   Culture   Final    NO GROWTH 5 DAYS Performed at [facility], [address], [place], [place] [address]    Report Status 11/08/2017 FINAL  Final  MRSA PCR Screening     Status: None   Collection Time: 11/04/17  1:26 AM  Result Value Ref Range Status   MRSA by PCR NEGATIVE NEGATIVE Final    Comment:        The GeneXpert MRSA Assay (FDA approved for NASAL specimens only), is one component of a comprehensive MRSA colonization surveillance program. It is not intended to diagnose MRSA infection nor to guide or monitor treatment for MRSA infections. Performed at [facility], [address], [place], [place] [address]          Radiology Studies: Dg Chest 1 View  Result Date: 11/12/2017 CLINICAL DATA:  Followup ventilator support. EXAM: CHEST 1 VIEW COMPARISON:  11/11/2017 FINDINGS: Endotracheal tube tip is 4 cm above the carina. Nasogastric tube enters the abdomen. There continues to be moderate atelectasis in both lower lungs right more than left with possible fluid overload but no frank edema. IMPRESSION: Persistent lower lobe volume loss right more than left. Electronically Signed   By: Nelson Chimes M.D.   On: 11/12/2017 08:51   Dg Chest Port 1 View  Result Date: 11/11/2017 CLINICAL DATA:  Respiratory failure, COPD EXAM: PORTABLE CHEST 1 VIEW COMPARISON:   11/10/2017  FINDINGS: Left pacer remains in place, unchanged. Cardiomegaly with vascular congestion. Right lower lobe airspace opacity is unchanged. Possible small right effusion. IMPRESSION: Continued right lower lobe infiltrate with small right effusion. Mild cardiomegaly, vascular congestion. Electronically Signed   By: Rolm Baptise M.D.   On: 11/11/2017 08:07        Scheduled Meds: . aspirin EC  81 mg Oral Daily  . atorvastatin  80 mg Oral q1800  . budesonide (PULMICORT) nebulizer solution  0.5 mg Nebulization BID  . chlorhexidine gluconate (MEDLINE KIT)  15 mL Mouth Rinse BID  . Chlorhexidine Gluconate Cloth  6 each Topical Daily  . clopidogrel  75 mg Oral Daily  . feeding supplement (PRO-STAT SUGAR FREE 64)  60 mL Per Tube TID  . feeding supplement (VITAL HIGH PROTEIN)  1,000 mL Per Tube Q24H  . heparin injection (subcutaneous)  5,000 Units Subcutaneous Q8H  . insulin aspart  0-20 Units Subcutaneous Q4H  . insulin aspart  16 Units Subcutaneous Q4H  . insulin glargine  90 Units Subcutaneous Daily  . ipratropium  0.5 mg Nebulization Q6H  . levalbuterol  0.63 mg Nebulization Q6H  . mouth rinse  15 mL Mouth Rinse 10 times per day  . methylPREDNISolone (SOLU-MEDROL) injection  40 mg Intravenous Q8H  . metoprolol tartrate  12.5 mg Oral BID  . pantoprazole (PROTONIX) IV  40 mg Intravenous QHS  . potassium chloride  10 mEq Oral Daily  . sodium chloride flush  10-40 mL Intracatheter Q12H  . tapentadol  100 mg Oral Q12H   Continuous Infusions: . propofol (DIPRIVAN) infusion 24.936 mcg/kg/min (11/12/17 1108)     LOS: 9 days    Critical care time spent: 55 minutes.     Lelon Frohlich, MD [facility] Pager [phone]  If 7PM-7AM, please contact night-coverage [email or web address] Password [facility] 11/12/2017, 12:38 PM
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
## 2017-11-12 NOTE — Progress Notes (Signed)
PHARMACIST - PHYSICIAN COMMUNICATION  DR:   TRH  CONCERNING: IV to Oral Route Change Policy  RECOMMENDATION: This patient is receiving Protonix by the intravenous route.  Based on criteria approved by the Pharmacy and Therapeutics Committee, the intravenous medication(s) is/are being converted to the equivalent oral dose form(s).   DESCRIPTION: These criteria include:  The patient is eating (either orally or via tube) and/or has been taking other orally administered medications for a least 24 hours  The patient has no evidence of active gastrointestinal bleeding or impaired GI absorption (gastrectomy, short bowel, patient on TNA or NPO).  If you have questions about this conversion, please contact the Pharmacy Department  [x]   [phone] )  [name] []   [phone] )  [name] []   [phone] )  [name] []   [phone] )  [name] []   [phone] )  [name]   Mady Gemma, [facility] [name] 11/12/2017 1:10 PM

## 2017-11-13 ENCOUNTER — Inpatient Hospital Stay (HOSPITAL_COMMUNITY): Payer: Medicaid Other

## 2017-11-13 LAB — COMPREHENSIVE METABOLIC PANEL
ALT: 75 U/L — AB (ref 14–54)
AST: 63 U/L — ABNORMAL HIGH (ref 15–41)
Albumin: 2.2 g/dL — ABNORMAL LOW (ref 3.5–5.0)
Alkaline Phosphatase: 107 U/L (ref 38–126)
Anion gap: 11 (ref 5–15)
BILIRUBIN TOTAL: 0.5 mg/dL (ref 0.3–1.2)
BUN: 61 mg/dL — AB (ref 6–20)
CHLORIDE: 110 mmol/L (ref 101–111)
CO2: 20 mmol/L — ABNORMAL LOW (ref 22–32)
CREATININE: 0.66 mg/dL (ref 0.44–1.00)
Calcium: 8.6 mg/dL — ABNORMAL LOW (ref 8.9–10.3)
Glucose, Bld: 98 mg/dL (ref 65–99)
POTASSIUM: 3.6 mmol/L (ref 3.5–5.1)
Sodium: 141 mmol/L (ref 135–145)
Total Protein: 6.2 g/dL — ABNORMAL LOW (ref 6.5–8.1)

## 2017-11-13 LAB — GLUCOSE, CAPILLARY
GLUCOSE-CAPILLARY: 108 mg/dL — AB (ref 65–99)
Glucose-Capillary: 87 mg/dL (ref 65–99)
Glucose-Capillary: 106 mg/dL — ABNORMAL HIGH (ref 65–99)
Glucose-Capillary: 121 mg/dL — ABNORMAL HIGH (ref 65–99)
Glucose-Capillary: 123 mg/dL — ABNORMAL HIGH (ref 65–99)
Glucose-Capillary: 95 mg/dL (ref 65–99)

## 2017-11-13 LAB — BLOOD GAS, ARTERIAL
ACID-BASE DEFICIT: 4.7 mmol/L — AB (ref 0.0–2.0)
BICARBONATE: 20.7 mmol/L (ref 20.0–28.0)
Drawn by: 21310
FIO2: 40
LHR: 22 {breaths}/min
O2 Saturation: 95.6 %
PCO2 ART: 35.4 mmHg (ref 32.0–48.0)
PEEP/CPAP: 5 cmH2O
PO2 ART: 90 mmHg (ref 83.0–108.0)
Patient temperature: 37
VT: 500 mL
pH, Arterial: 7.365 (ref 7.350–7.450)

## 2017-11-13 LAB — CBC
HEMATOCRIT: 41.7 % (ref 36.0–46.0)
Hemoglobin: 13.4 g/dL (ref 12.0–15.0)
MCH: 29.8 pg (ref 26.0–34.0)
MCHC: 32.1 g/dL (ref 30.0–36.0)
MCV: 92.7 fL (ref 78.0–100.0)
Platelets: 152 10*3/uL (ref 150–400)
RBC: 4.5 MIL/uL (ref 3.87–5.11)
RDW: 14.9 % (ref 11.5–15.5)
WBC: 23.5 10*3/uL — AB (ref 4.0–10.5)

## 2017-11-13 LAB — TRIGLYCERIDES: TRIGLYCERIDES: 286 mg/dL — AB (ref <150)

## 2017-11-13 MED ORDER — FUROSEMIDE 10 MG/ML IJ SOLN
40.0000 mg | Freq: Once | INTRAMUSCULAR | Status: AC
  Administered 2017-11-13: 40 mg via INTRAVENOUS
  Filled 2017-11-13: qty 4

## 2017-11-13 MED ORDER — METOPROLOL TARTRATE 25 MG PO TABS: 25.0000 mg | ORAL_TABLET | Freq: Two times a day (BID) | ORAL | Status: DC

## 2017-11-13 MED ORDER — MORPHINE SULFATE (PF) 2 MG/ML IV SOLN
1.0000 mg | INTRAVENOUS | Status: DC | PRN
Start: 2017-11-13 — End: 2017-11-17
  Administered 2017-11-13 – 2017-11-14 (×3): 1 mg via INTRAVENOUS
  Filled 2017-11-13 (×3): qty 1

## 2017-11-13 NOTE — Progress Notes (Signed)
PROGRESS NOTE    [name]  YSA:[id number] DOB: 08-27-1966 DOA: 10/30/2017 PCP: [facility] [place]     Brief Narrative:  52 year old woman admitted from home on 2/11 due to shortness of breath.  She apparently ran out of her breathing treatments, began to become short of breath a few days prior to admission and also developed a cough with yellow sputum.  In the ED she was found to be positive for influenza A O2 sats were in the 80s.  Of note patient was also found to be in DKA and 2D echo showed an ejection fraction of 30% with concerns for Takotsubo cardiomyopathy.  She was subsequently intubated.  Extubated on 2/21.   Assessment & Plan:   Active Problems:   DM type 2 causing vascular disease ([place])   Current smoker   ESOPHAGEAL STRICTURE   Esophageal reflux   Gastroparesis   Cardiac pacemaker in situ   COPD GOLD 0 / still smoking    OSA (obstructive sleep apnea)   Diabetic neuropathy (HCC)   Dysphagia   Crohn's disease without complication (HCC)   COPD exacerbation (HCC)   Influenza A   Acute respiratory failure with hypoxia (HCC)   Hyponatremia   Hypokalemia   Acute on chronic hypoxemic respiratory failure, ventilatory dependent -Presumed due to a combination of influenza A, pneumonia as well as COPD with exacerbation. -Continue weaning efforts with plans to extubate later today. -Appreciate Dr. Luan Pulling input and recommendations regarding ventilatory management. -Continue cefepime for planned duration of 8 days. -Continue steroids, nebs. -Completed course of Tamiflu for influenza A.  Sepsis -Due to above, sepsis parameters improving.  DKA -Resolved. -In the setting of steroid use and infection and possibly Invokana. -She has now been transitioned to sliding scale with Lantus and is well controlled.  Non-STEMI -Troponin peaked at 1.68.   -continue aspirin, Plavix, Lipitor. -Reviewed echo from February 12: Ejection fraction of 30% with akinesis  of the mid apical anterior, mid anteroseptal, apical inferior, apical septal, apical lateral and apical myocardium. 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 -Cardiology is involved. -Low-dose Lopressor has been added due to improvement in blood pressure. -Additional dose of IV Lasix ordered for today given adequate diuresis and improvement in chest x-ray.  Hypernatremia -Resolved.   DVT prophylaxis: Subcutaneous heparin Code Status: Full code Family Communication: No family at bedside Disposition Plan: Continue weaning efforts  Consultants:   Cardiology  Pulmonology  Procedures:   Echo as above  Antimicrobials:  Anti-infectives (From admission, onward)   Start     Dose/Rate Route Frequency Ordered Stop   11/04/17 2000  levofloxacin (LEVAQUIN) IVPB 750 mg  Status:  Discontinued     750 mg 100 mL/hr over 90 Minutes Intravenous Every 24 hours 11/14/2017 2041 11/06/17 0939   11/04/17 1000  ceFEPIme (MAXIPIME) 1 g in sodium chloride 0.9 % 100 mL IVPB     1 g 200 mL/hr over 30 Minutes Intravenous Every 8 hours 11/04/17 0814 11/12/17 0243   11/04/17 0500  vancomycin (VANCOCIN) 1,250 mg in sodium chloride 0.9 % 250 mL IVPB  Status:  Discontinued     1,250 mg 166.7 mL/hr over 90 Minutes Intravenous Every 12 hours 11/06/2017 2041 11/06/17 0939   11/17/2017 2345  oseltamivir (TAMIFLU) capsule 75 mg     75 mg Oral 2 times daily 11/01/2017 2336 11/08/17 1135   11/05/2017 2000  oseltamivir (TAMIFLU) capsule 75 mg     75 mg Oral  Once 11/07/2017 1952 11/04/2017  2010   11/14/2017 1800  vancomycin (VANCOCIN) IVPB 1000 mg/200 mL premix     1,000 mg 200 mL/hr over 60 Minutes Intravenous  Once 10/25/2017 1755 10/28/2017 1957   11/17/2017 1800  levofloxacin (LEVAQUIN) IVPB 750 mg     750 mg 100 mL/hr over 90 Minutes Intravenous  Once 11/10/2017 1755 11/02/2017 1957       Subjective: When I saw her this morning she was still intubated, eyes open to voice.  Objective: Vitals:    11/13/17 1400 11/13/17 1500 11/13/17 1600 11/13/17 1625  BP: (!) 149/106 (!) 124/97 (!) 122/96   Pulse: 82 86 100   Resp: (!) 23 (!) 21 19   Temp:    98.6 F (37 C)  TempSrc:    Axillary  SpO2: 96% 93% 93%   Weight:      Height:        Intake/Output Summary (Last 24 hours) at 11/13/2017 1857 Last data filed at 11/13/2017 1803 Gross per 24 hour  Intake 484.09 ml  Output 3300 ml  Net -2815.91 ml   Filed Weights   11/11/17 0500 11/12/17 0500 11/13/17 0400  Weight: 95.6 kg (210 lb 12.2 oz) 91.8 kg (202 lb 6.1 oz) 89.9 kg (198 lb 3.1 oz)    Examination:  General exam: Intubated, opens eyes to voice Respiratory system: No wheezes, mild rhonchi Cardiovascular system:RRR. No murmurs, rubs, gallops. Gastrointestinal system: Abdomen is nondistended, soft and nontender. No organomegaly or masses felt. Normal bowel sounds heard. Central nervous system: Unable to assess given intubation state Extremities: 1+ edema Psychiatry: Unable to assess given current mental state.     Data Reviewed: I have personally reviewed following labs and imaging studies  CBC: Recent Labs  Lab 11/08/17 0433 11/09/17 0600 11/10/17 0533 11/12/17 0556 11/13/17 0541  WBC 22.3* 25.5* 24.8* 22.7* 23.5*  NEUTROABS  --   --   --  19.5*  --   HGB 12.6 12.3 12.5 12.4 13.4  HCT 38.2 37.4 39.5 38.5 41.7  MCV 93.9 92.6 95.0 94.1 92.7  PLT 201 199 219 173 740   Basic Metabolic Panel: Recent Labs  Lab 11/07/17 0556  11/09/17 0600 11/10/17 0533 11/11/17 0437 11/12/17 0556 11/13/17 0541  NA 141   < > 146* 150* 146* 143 141  K 3.3*   < > 3.8 3.8 3.9 3.8 3.6  CL 114*   < > 120* 122* 120* 116* 110  CO2 17*   < > 17* 16* 17* 17* 20*  GLUCOSE 146*   < > 174* 250* 124* 65 98  BUN 55*   < > 62* 71* 64* 58* 61*  CREATININE 1.10*   < > 0.87 0.83 0.79 0.71 0.66  CALCIUM 8.7*   < > 8.5* 8.6* 8.5* 8.4* 8.6*  MG 2.2  --   --   --   --   --   --    < > = values in this interval not displayed.   GFR: Estimated  Creatinine Clearance: 90.4 mL/min (by C-G formula based on SCr of 0.66 mg/dL). Liver Function Tests: Recent Labs  Lab 11/09/17 0600 11/10/17 0533 11/11/17 0437 11/12/17 0556 11/13/17 0541  AST 34 53* 63* 61* 63*  ALT 27 41 53 63* 75*  ALKPHOS 64 102 107 100 107  BILITOT 0.7 0.4 0.3 0.6 0.5  PROT 5.9* 5.5* 5.6* 5.5* 6.2*  ALBUMIN 2.1* 2.0* 2.1* 2.1* 2.2*   No results for input(s): LIPASE, AMYLASE in the last 168 hours. No results for  input(s): AMMONIA in the last 168 hours. Coagulation Profile: No results for input(s): INR, PROTIME in the last 168 hours. Cardiac Enzymes: No results for input(s): CKTOTAL, CKMB, CKMBINDEX, TROPONINI in the last 168 hours. BNP (last 3 results) No results for input(s): PROBNP in the last 8760 hours. HbA1C: No results for input(s): HGBA1C in the last 72 hours. CBG: Recent Labs  Lab 11/13/17 0015 11/13/17 0425 11/13/17 0737 11/13/17 1120 11/13/17 1624  GLUCAP 108* 87 106* 121* 123*   Lipid Profile: Recent Labs    11/11/17 0437 11/13/17 0541  TRIG 329* 286*   Thyroid Function Tests: No results for input(s): TSH, T4TOTAL, FREET4, T3FREE, THYROIDAB in the last 72 hours. Anemia Panel: No results for input(s): VITAMINB12, FOLATE, FERRITIN, TIBC, IRON, RETICCTPCT in the last 72 hours. Urine analysis:    Component Value Date/Time   COLORURINE YELLOW 10/30/2017 2130   APPEARANCEUR CLOUDY (A) 11/04/2017 2130   LABSPEC 1.022 11/19/2017 2130   PHURINE 6.0 10/31/2017 2130   GLUCOSEU >=500 (A) 10/31/2017 2130   HGBUR SMALL (A) 11/08/2017 2130   BILIRUBINUR NEGATIVE 11/07/2017 2130   BILIRUBINUR neg 03/23/2015 1735   KETONESUR 20 (A) 11/13/2017 2130   PROTEINUR 100 (A) 10/26/2017 2130   UROBILINOGEN 0.2 04/01/2015 0633   NITRITE NEGATIVE 11/06/2017 2130   LEUKOCYTESUR NEGATIVE 11/02/2017 2130   Sepsis Labs: _0 (procalcitonin:4,lacticidven:4)  ) Recent Results (from the past 240 hour(s))  MRSA PCR Screening     Status: None    Collection Time: 11/04/17  1:26 AM  Result Value Ref Range Status   MRSA by PCR NEGATIVE NEGATIVE Final    Comment:        The GeneXpert MRSA Assay (FDA approved for NASAL specimens only), is one component of a comprehensive MRSA colonization surveillance program. It is not intended to diagnose MRSA infection nor to guide or monitor treatment for MRSA infections. Performed at [facility], [address], [place], [place] [address]          Radiology Studies: Dg Chest 1 View  Result Date: 11/12/2017 CLINICAL DATA:  Followup ventilator support. EXAM: CHEST 1 VIEW COMPARISON:  11/11/2017 FINDINGS: Endotracheal tube tip is 4 cm above the carina. Nasogastric tube enters the abdomen. There continues to be moderate atelectasis in both lower lungs right more than left with possible fluid overload but no frank edema. IMPRESSION: Persistent lower lobe volume loss right more than left. Electronically Signed   By: Nelson Chimes M.D.   On: 11/12/2017 08:51   Dg Chest Port 1 View  Result Date: 11/13/2017 CLINICAL DATA:  Hypoxia.  COPD.  Pacemaker. EXAM: PORTABLE CHEST 1 VIEW COMPARISON:  11/12/2017. FINDINGS: Patient is rotated to the right. Endotracheal tube and NG tube in stable position. Cardiac pacer noted lead tips in stable position. Heart size normal. Mild increased density noted in the right upper lung, this may be from patient rotation, right upper lobe infiltrate may be present. Improved aeration in the lung bases. Small right pleural effusion. No pneumothorax. IMPRESSION: 1.  Lines and tubes in stable position. 2. Mild increase density noted the right upper lung. This may be related to patient rotation however right upper lobe infiltrate may be present. 3.  Improved aeration both lung bases. Electronically Signed   By: Marcello Moores  Register   On: 11/13/2017 07:16        Scheduled Meds: . aspirin EC  81 mg Oral Daily  . atorvastatin  80 mg Oral q1800  . budesonide (PULMICORT) nebulizer  solution  0.5 mg  Nebulization BID  . chlorhexidine gluconate (MEDLINE KIT)  15 mL Mouth Rinse BID  . Chlorhexidine Gluconate Cloth  6 each Topical Daily  . clopidogrel  75 mg Oral Daily  . feeding supplement (PRO-STAT SUGAR FREE 64)  60 mL Per Tube TID  . heparin injection (subcutaneous)  5,000 Units Subcutaneous Q8H  . insulin aspart  0-20 Units Subcutaneous Q4H  . insulin aspart  16 Units Subcutaneous Q4H  . insulin glargine  90 Units Subcutaneous Daily  . ipratropium  0.5 mg Nebulization Q6H  . levalbuterol  0.63 mg Nebulization Q6H  . mouth rinse  15 mL Mouth Rinse 10 times per day  . methylPREDNISolone (SOLU-MEDROL) injection  40 mg Intravenous Q8H  . metoprolol tartrate  25 mg Oral BID  . pantoprazole sodium  40 mg Per Tube QHS  . potassium chloride  10 mEq Oral Daily  . sodium chloride flush  10-40 mL Intracatheter Q12H  . tapentadol  100 mg Oral Q12H   Continuous Infusions: . propofol (DIPRIVAN) infusion Stopped (11/13/17 0730)     LOS: 10 days    Critical care time spent: 35 minutes.     Lelon Frohlich, MD [facility] Pager [phone]  If 7PM-7AM, please contact night-coverage [email or web address] Password [facility] 11/13/2017, 6:57 PM

## 2017-11-13 NOTE — Progress Notes (Signed)
Progress Note  Patient Name: [name] Date of Encounter: 11/13/2017  Primary Cardiologist: Dr. Satira Sark  Subjective   Patient remains sedated on the ventilator.  Inpatient Medications    Scheduled Meds: . aspirin EC  81 mg Oral Daily  . atorvastatin  80 mg Oral q1800  . budesonide (PULMICORT) nebulizer solution  0.5 mg Nebulization BID  . chlorhexidine gluconate (MEDLINE KIT)  15 mL Mouth Rinse BID  . Chlorhexidine Gluconate Cloth  6 each Topical Daily  . clopidogrel  75 mg Oral Daily  . feeding supplement (PRO-STAT SUGAR FREE 64)  60 mL Per Tube TID  . feeding supplement (VITAL HIGH PROTEIN)  1,000 mL Per Tube Q24H  . heparin injection (subcutaneous)  5,000 Units Subcutaneous Q8H  . insulin aspart  0-20 Units Subcutaneous Q4H  . insulin aspart  16 Units Subcutaneous Q4H  . insulin glargine  90 Units Subcutaneous Daily  . ipratropium  0.5 mg Nebulization Q6H  . levalbuterol  0.63 mg Nebulization Q6H  . mouth rinse  15 mL Mouth Rinse 10 times per day  . methylPREDNISolone (SOLU-MEDROL) injection  40 mg Intravenous Q8H  . metoprolol tartrate  12.5 mg Oral BID  . pantoprazole sodium  40 mg Per Tube QHS  . potassium chloride  10 mEq Oral Daily  . sodium chloride flush  10-40 mL Intracatheter Q12H  . tapentadol  100 mg Oral Q12H   Continuous Infusions: . propofol (DIPRIVAN) infusion Stopped (11/13/17 0730)   PRN Meds: acetaminophen **OR** acetaminophen, fentaNYL (SUBLIMAZE) injection, fentaNYL (SUBLIMAZE) injection, levalbuterol, LORazepam, sodium chloride flush   Vital Signs    Vitals:   11/13/17 0600 11/13/17 0630 11/13/17 0739 11/13/17 0755  BP: 106/77 111/76    Pulse: 64 (!) 59 68   Resp: (!) 22 (!) 22 (!) 21   Temp:   97.9 F (36.6 C)   TempSrc:   Axillary   SpO2: 93% 92% 95% 97%  Weight:      Height:        Intake/Output Summary (Last 24 hours) at 11/13/2017 0907 Last data filed at 11/13/2017 0519 Gross per 24 hour  Intake 941.26 ml    Output 4226 ml  Net -3284.74 ml   Filed Weights   11/11/17 0500 11/12/17 0500 11/13/17 0400  Weight: 210 lb 12.2 oz (95.6 kg) 202 lb 6.1 oz (91.8 kg) 198 lb 3.1 oz (89.9 kg)    Telemetry    Sinus rhythm.  Personally reviewed.  Physical Exam   GEN:  Sedated on ventilator.  Neck: No JVD. Cardiac: RRR, no gallop.  Respiratory:  Scattered rhonchi without wheezing. GI: Soft, nontender, bowel sounds present. MS:  SCDs in place. No deformity. Neuro:   Patient sedated. Psych: Patient sedated.  Labs    Chemistry Recent Labs  Lab 11/11/17 0437 11/12/17 0556 11/13/17 0541  NA 146* 143 141  K 3.9 3.8 3.6  CL 120* 116* 110  CO2 17* 17* 20*  GLUCOSE 124* 65 98  BUN 64* 58* 61*  CREATININE 0.79 0.71 0.66  CALCIUM 8.5* 8.4* 8.6*  PROT 5.6* 5.5* 6.2*  ALBUMIN 2.1* 2.1* 2.2*  AST 63* 61* 63*  ALT 53 63* 75*  ALKPHOS 107 100 107  BILITOT 0.3 0.6 0.5  GFRNONAA >60 >60 >60  GFRAA >60 >60 >60  ANIONGAP 9 10 11      Hematology Recent Labs  Lab 11/10/17 0533 11/12/17 0556 11/13/17 0541  WBC 24.8* 22.7* 23.5*  RBC 4.16 4.09 4.50  HGB 12.5  12.4 13.4  HCT 39.5 38.5 41.7  MCV 95.0 94.1 92.7  MCH 30.0 30.3 29.8  MCHC 31.6 32.2 32.1  RDW 15.6* 15.0 14.9  PLT 219 173 152    Cardiac Enzymes Recent Labs  Lab 11/06/17 0909  TROPONINI 1.47*   No results for input(s): TROPIPOC in the last 168 hours.   Radiology    Dg Chest 1 View  Result Date: 11/12/2017 CLINICAL DATA:  Followup ventilator support. EXAM: CHEST 1 VIEW COMPARISON:  11/11/2017 FINDINGS: Endotracheal tube tip is 4 cm above the carina. Nasogastric tube enters the abdomen. There continues to be moderate atelectasis in both lower lungs right more than left with possible fluid overload but no frank edema. IMPRESSION: Persistent lower lobe volume loss right more than left. Electronically Signed   By: Nelson Chimes M.D.   On: 11/12/2017 08:51   Dg Chest Port 1 View  Result Date: 11/13/2017 CLINICAL DATA:  Hypoxia.   COPD.  Pacemaker. EXAM: PORTABLE CHEST 1 VIEW COMPARISON:  11/12/2017. FINDINGS: Patient is rotated to the right. Endotracheal tube and NG tube in stable position. Cardiac pacer noted lead tips in stable position. Heart size normal. Mild increased density noted in the right upper lung, this may be from patient rotation, right upper lobe infiltrate may be present. Improved aeration in the lung bases. Small right pleural effusion. No pneumothorax. IMPRESSION: 1.  Lines and tubes in stable position. 2. Mild increase density noted the right upper lung. This may be related to patient rotation however right upper lobe infiltrate may be present. 3.  Improved aeration both lung bases. Electronically Signed   By: Marcello Moores  Register   On: 11/13/2017 07:16    Cardiac Studies   Echocardiogram 11/04/2017: Study Conclusions  - Left ventricle: The cavity size was normal. Wall thickness was normal. Systolic function was moderately to severely reduced. The estimated ejection fraction was = 30%. - Regional wall motion abnormality: Akinesis of the mid-apical anterior, mid anteroseptal, apical inferior, apical septal, apical lateral, and apical myocardium. - Aortic valve: Mildly calcified annulus. Trileaflet; normal thickness leaflets. Valve area (VTI): 2.98 cm^2. Valve area (Vmax): 2.92 cm^2. Valve area (Vmean): 2.61 cm^2. - Mitral valve: Mildly calcified annulus. Normal thickness leaflets - Tricuspid valve: There was mild-moderate regurgitation. - Technically difficult study. Echcontrast used to Engineer, water.  Impressions:  - Acute decrease in LVEF and new wall motion abnormalities since January 23,2019 study. Pattern of wall motion abnormalities typical of stress induced cardiomyopathy/Takotsubo syndrome, however this diagnosis requires exclusing of coronary artery disease.  Patient Profile     52 y.o. female with a history of inappropriate sinus tachycardia status  post sinus node modification and subsequent pacemaker placement by Dr. Lovena Le, COPD, previous stroke, and hypertension. She is now being managed for hypoxic respiratory failure with COPD exacerbation and influenza A/pneumonia. 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  Assessment & Plan    1.  Probable type II NSTEMI in the setting of stress-induced cardiomyopathy with peak troponin I 1.68.  2.  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  This is this represents an acute decrease since January.  3.  Influenza A and COPD exacerbation.  4.  Hypoxic respiratory failure, remains ventilator dependent.  Continuing to be managed by Dr. Luan Pulling.  5.  History of inappropriate sinus tachycardia status post sinus node modification and subsequent placement of [employer] pacemaker by Dr. Lovena Le.  6.  History of stroke, on dual antiplatelet therapy.  Continue aspirin, Plavix, and Lipitor.  Increase Lopressor to 25 mg twice daily.  Chest x-ray shows some clearing and she had a good diuresis last 24 hours.  Repeat Lasix dose today.  Signed, Rozann Lesches, MD  11/13/2017, 9:07 AM

## 2017-11-13 NOTE — Progress Notes (Signed)
Subjective: She remains intubated and on the ventilator.  She was able to stay off full ventilator support for most of the day yesterday.  She is starting a weaning process now.  Objective: Vital signs in last 24 hours: Temp:  [97.6 F (36.4 C)-98.8 F (37.1 C)] 97.9 F (36.6 C) (02/21 0739) Pulse Rate:  [36-99] 68 (02/21 0739) Resp:  [5-33] 21 (02/21 0739) BP: (102-151)/(67-103) 111/76 (02/21 0630) SpO2:  [92 %-98 %] 97 % (02/21 0755) FiO2 (%):  [40 %] 40 % (02/21 0755) Weight:  [89.9 kg (198 lb 3.1 oz)] 89.9 kg (198 lb 3.1 oz) (02/21 0400) Weight change: -1.9 kg (-3 oz) Last BM Date: 11/13/17  Intake/Output from previous day: 02/20 0701 - 02/21 0700 In: 1082.9 [I.V.:522.9; NG/GT:240] Out: 4226 [Urine:3975; Emesis/NG output:250; Stool:1]  PHYSICAL EXAM General appearance: Intubated sedated on mechanical ventilation Resp: rhonchi bilaterally Cardio: Heart is regular with less pacer beats GI: soft, non-tender; bowel sounds normal; no masses,  no organomegaly Extremities: extremities normal, atraumatic, no cyanosis or edema Skin warm and dry  Lab Results:  Results for orders placed or performed during the hospital encounter of 11/17/2017 (from the past 48 hour(s))  Glucose, capillary     Status: Abnormal   Collection Time: 11/11/17 12:06 PM  Result Value Ref [place]   Glucose-Capillary 154 (H) 65 - 99 mg/dL  Glucose, capillary     Status: Abnormal   Collection Time: 11/11/17  4:03 PM  Result Value Ref [place]   Glucose-Capillary 124 (H) 65 - 99 mg/dL  Glucose, capillary     Status: Abnormal   Collection Time: 11/11/17  4:58 PM  Result Value Ref [place]   Glucose-Capillary 151 (H) 65 - 99 mg/dL  Glucose, capillary     Status: Abnormal   Collection Time: 11/11/17  8:09 PM  Result Value Ref [place]   Glucose-Capillary 158 (H) 65 - 99 mg/dL  Glucose, capillary     Status: Abnormal   Collection Time: 11/12/17 12:14 AM  Result Value Ref [place]   Glucose-Capillary 123 (H) 65 - 99  mg/dL  Glucose, capillary     Status: None   Collection Time: 11/12/17  4:38 AM  Result Value Ref [place]   Glucose-Capillary 75 65 - 99 mg/dL  Draw ABG 1 hour after initiation of ventilator     Status: Abnormal   Collection Time: 11/12/17  4:55 AM  Result Value Ref [place]   FIO2 40.00    Delivery systems VENTILATOR    Mode ASSIST CONTROL    VT 500 mL   LHR 22 resp/min   Peep/cpap 5.0 cm H20   pH, Arterial 7.313 (L) 7.350 - 7.450   pCO2 arterial 35.5 32.0 - 48.0 mmHg   pO2, Arterial 86.0 83.0 - 108.0 mmHg   Bicarbonate 18.5 (L) 20.0 - 28.0 mmol/L   Acid-base deficit 7.5 (H) 0.0 - 2.0 mmol/L   O2 Saturation 94.5 %   Patient temperature 37.0    Collection site LEFT RADIAL    Drawn by [id number]    Sample type ARTERIAL    Allens test (pass/fail) PASS PASS    Comment: Performed at [facility], [address] [place], [place] [address]  Comprehensive metabolic panel     Status: Abnormal   Collection Time: 11/12/17  5:56 AM  Result Value Ref [place]   Sodium 143 135 - 145 mmol/L   Potassium 3.8 3.5 - 5.1 mmol/L   Chloride 116 (H) 101 - 111 mmol/L   CO2 17 (L) 22 -  32 mmol/L   Glucose, Bld 65 65 - 99 mg/dL   BUN 58 (H) 6 - 20 mg/dL   Creatinine, Ser 0.71 0.44 - 1.00 mg/dL   Calcium 8.4 (L) 8.9 - 10.3 mg/dL   Total Protein 5.5 (L) 6.5 - 8.1 g/dL   Albumin 2.1 (L) 3.5 - 5.0 g/dL   AST 61 (H) 15 - 41 U/L   ALT 63 (H) 14 - 54 U/L   Alkaline Phosphatase 100 38 - 126 U/L   Total Bilirubin 0.6 0.3 - 1.2 mg/dL   GFR calc non Af Amer >60 >60 mL/min   GFR calc Af Amer >60 >60 mL/min    Comment: (NOTE) The eGFR has been calculated using the CKD EPI equation. This calculation has not been validated in all clinical situations. eGFR's persistently <60 mL/min signify possible Chronic Kidney Disease.    Anion gap 10 5 - 15    Comment: Performed at [facility], [address] [place], [place] [address]  CBC with Differential/Platelet     Status: Abnormal   Collection Time: 11/12/17  5:56 AM   Result Value Ref [place]   WBC 22.7 (H) 4.0 - 10.5 K/uL   RBC 4.09 3.87 - 5.11 MIL/uL   Hemoglobin 12.4 12.0 - 15.0 g/dL   HCT 38.5 36.0 - 46.0 %   MCV 94.1 78.0 - 100.0 fL   MCH 30.3 26.0 - 34.0 pg   MCHC 32.2 30.0 - 36.0 g/dL   RDW 15.0 11.5 - 15.5 %   Platelets 173 150 - 400 K/uL   Neutrophils Relative % 86 %   Lymphocytes Relative 8 %   Monocytes Relative 6 %   Eosinophils Relative 0 %   Basophils Relative 0 %   Neutro Abs 19.5 (H) 1.7 - 7.7 K/uL   Lymphs Abs 1.8 0.7 - 4.0 K/uL   Monocytes Absolute 1.4 (H) 0.1 - 1.0 K/uL   Eosinophils Absolute 0.0 0.0 - 0.7 K/uL   Basophils Absolute 0.0 0.0 - 0.1 K/uL   WBC Morphology MILD LEFT SHIFT (1-5% METAS, OCC MYELO, OCC BANDS)     Comment: ATYPICAL LYMPHOCYTES Performed at [facility], [address], [place], [place] [address]   Glucose, capillary     Status: None   Collection Time: 11/12/17  8:05 AM  Result Value Ref [place]   Glucose-Capillary 73 65 - 99 mg/dL  Glucose, capillary     Status: Abnormal   Collection Time: 11/12/17 11:13 AM  Result Value Ref [place]   Glucose-Capillary 132 (H) 65 - 99 mg/dL  Glucose, capillary     Status: Abnormal   Collection Time: 11/12/17  4:29 PM  Result Value Ref [place]   Glucose-Capillary 186 (H) 65 - 99 mg/dL   Comment 1 Notify RN    Comment 2 Document in Chart   Glucose, capillary     Status: Abnormal   Collection Time: 11/12/17  8:19 PM  Result Value Ref [place]   Glucose-Capillary 142 (H) 65 - 99 mg/dL   Comment 1 Notify RN    Comment 2 Document in Chart   Glucose, capillary     Status: Abnormal   Collection Time: 11/13/17 12:15 AM  Result Value Ref [place]   Glucose-Capillary 108 (H) 65 - 99 mg/dL   Comment 1 Notify RN    Comment 2 Document in Chart   Glucose, capillary     Status: None   Collection Time: 11/13/17  4:25 AM  Result Value Ref [place]   Glucose-Capillary 87  65 - 99 mg/dL   Comment 1 Notify RN    Comment 2 Document in Chart   Draw ABG 1 hour after initiation of  ventilator     Status: Abnormal   Collection Time: 11/13/17  5:00 AM  Result Value Ref [place]   FIO2 40.00    Delivery systems VENTILATOR    Mode ASSIST CONTROL    VT 500 mL   LHR 22 resp/min   Peep/cpap 5.0 cm H20   pH, Arterial 7.365 7.350 - 7.450   pCO2 arterial 35.4 32.0 - 48.0 mmHg   pO2, Arterial 90.0 83.0 - 108.0 mmHg   Bicarbonate 20.7 20.0 - 28.0 mmol/L   Acid-base deficit 4.7 (H) 0.0 - 2.0 mmol/L   O2 Saturation 95.6 %   Patient temperature 37.0    Collection site LEFT RADIAL    Drawn by [id number]    Sample type ARTERIAL    Allens test (pass/fail) PASS PASS    Comment: Performed at [facility], [address] [place], [place] [address]  Comprehensive metabolic panel     Status: Abnormal   Collection Time: 11/13/17  5:41 AM  Result Value Ref [place]   Sodium 141 135 - 145 mmol/L   Potassium 3.6 3.5 - 5.1 mmol/L   Chloride 110 101 - 111 mmol/L   CO2 20 (L) 22 - 32 mmol/L   Glucose, Bld 98 65 - 99 mg/dL   BUN 61 (H) 6 - 20 mg/dL   Creatinine, Ser 0.66 0.44 - 1.00 mg/dL   Calcium 8.6 (L) 8.9 - 10.3 mg/dL   Total Protein 6.2 (L) 6.5 - 8.1 g/dL   Albumin 2.2 (L) 3.5 - 5.0 g/dL   AST 63 (H) 15 - 41 U/L   ALT 75 (H) 14 - 54 U/L   Alkaline Phosphatase 107 38 - 126 U/L   Total Bilirubin 0.5 0.3 - 1.2 mg/dL   GFR calc non Af Amer >60 >60 mL/min   GFR calc Af Amer >60 >60 mL/min    Comment: (NOTE) The eGFR has been calculated using the CKD EPI equation. This calculation has not been validated in all clinical situations. eGFR's persistently <60 mL/min signify possible Chronic Kidney Disease.    Anion gap 11 5 - 15    Comment: Performed at [facility], [address], [place], [place] [address]  Triglycerides     Status: Abnormal   Collection Time: 11/13/17  5:41 AM  Result Value Ref [place]   Triglycerides 286 (H) <150 mg/dL    Comment: Performed at [facility], [address], [place], [place] [address]  CBC     Status: Abnormal   Collection Time: 11/13/17  5:41 AM   Result Value Ref [place]   WBC 23.5 (H) 4.0 - 10.5 K/uL   RBC 4.50 3.87 - 5.11 MIL/uL   Hemoglobin 13.4 12.0 - 15.0 g/dL   HCT 41.7 36.0 - 46.0 %   MCV 92.7 78.0 - 100.0 fL   MCH 29.8 26.0 - 34.0 pg   MCHC 32.1 30.0 - 36.0 g/dL   RDW 14.9 11.5 - 15.5 %   Platelets 152 150 - 400 K/uL    Comment: Performed at [facility] [address], [place], [place] [address]  Glucose, capillary     Status: Abnormal   Collection Time: 11/13/17  7:37 AM  Result Value Ref [place]   Glucose-Capillary 106 (H) 65 - 99 mg/dL    ABGS Recent Labs    11/13/17 0500  PHART 7.365  PO2ART 90.0  HCO3 20.7   CULTURES Recent Results (from the past 240 hour(s))  Blood Culture (routine x 2)     Status: None   Collection Time: 11/06/2017  6:27 PM  Result Value Ref [place] Status   Specimen Description RIGHT ANTECUBITAL  Final   Special Requests   Final    BOTTLES DRAWN AEROBIC AND ANAEROBIC Blood Culture adequate volume   Culture   Final    NO GROWTH 5 DAYS Performed at [facility], [address], [place], [place] [address]    Report Status 11/08/2017 FINAL  Final  Blood Culture (routine x 2)     Status: None   Collection Time: 11/02/2017  6:27 PM  Result Value Ref [place] Status   Specimen Description RIGHT ANTECUBITAL  Final   Special Requests   Final    BOTTLES DRAWN AEROBIC AND ANAEROBIC Blood Culture adequate volume   Culture   Final    NO GROWTH 5 DAYS Performed at [facility], [address], [place], [place] [address]    Report Status 11/08/2017 FINAL  Final  MRSA PCR Screening     Status: None   Collection Time: 11/04/17  1:26 AM  Result Value Ref [place] Status   MRSA by PCR NEGATIVE NEGATIVE Final    Comment:        The GeneXpert MRSA Assay (FDA approved for NASAL specimens only), is one component of a comprehensive MRSA colonization surveillance program. It is not intended to diagnose MRSA infection nor to guide or monitor treatment for MRSA infections. Performed at [facility], [address], [place], [place] [address]    Studies/Results: Dg Chest 1 View  Result Date: 11/12/2017 CLINICAL DATA:  Followup ventilator support. EXAM: CHEST 1 VIEW COMPARISON:  11/11/2017 FINDINGS: Endotracheal tube tip is 4 cm above the carina. Nasogastric tube enters the abdomen. There continues to be moderate atelectasis in both lower lungs right more than left with possible fluid overload but no frank edema. IMPRESSION: Persistent lower lobe volume loss right more than left. Electronically Signed   By: Nelson Chimes M.D.   On: 11/12/2017 08:51   Dg Chest Port 1 View  Result Date: 11/13/2017 CLINICAL DATA:  Hypoxia.  COPD.  Pacemaker. EXAM: PORTABLE CHEST 1 VIEW COMPARISON:  11/12/2017. FINDINGS: Patient is rotated to the right. Endotracheal tube and NG tube in stable position. Cardiac pacer noted lead tips in stable position. Heart size normal. Mild increased density noted in the right upper lung, this may be from patient rotation, right upper lobe infiltrate may be present. Improved aeration in the lung bases. Small right pleural effusion. No pneumothorax. IMPRESSION: 1.  Lines and tubes in stable position. 2. Mild increase density noted the right upper lung. This may be related to patient rotation however right upper lobe infiltrate may be present. 3.  Improved aeration both lung bases. Electronically Signed   By: Marcello Moores  Register   On: 11/13/2017 07:16    Medications:  Prior to Admission:  Medications Prior to Admission  Medication Sig Dispense Refill Last Dose  . acetaminophen (TYLENOL) 500 MG tablet Take 500 mg by mouth every 6 (six) hours as needed for mild pain or moderate pain.   11/02/2017 at Unknown time  . aspirin EC 81 MG tablet Take 81 mg by mouth daily.   11/15/2017 at Unknown time  . carvedilol (COREG) 6.25 MG tablet Take 1 tablet (6.25 mg total) by mouth 2 (two) times daily. 180 tablet 3 10/31/2017 at Unknown time  .  Cholecalciferol (VITAMIN D3) 5000 units CAPS Take 1  capsule (5,000 Units total) by mouth daily. 90 capsule 0 11/20/2017 at Unknown time  . clopidogrel (PLAVIX) 75 MG tablet Take 75 mg by mouth daily.   11/17/2017 at Unknown time  . diclofenac sodium (VOLTAREN) 1 % GEL Apply 4 g topically 4 (four) times daily. (Patient taking differently: Apply 2-4 g topically daily as needed (for leg pain). ) 200 g 11 Past Month at Unknown time  . dicyclomine (BENTYL) 20 MG tablet Take 1 tablet (20 mg total) by mouth every 8 (eight) hours as needed for spasms. 90 tablet 3 11/07/2017 at Unknown time  . diphenoxylate-atropine (LOMOTIL) 2.5-0.025 MG tablet Take 2 tabs by mouth x 1, then 1 tab every 4 hrs PRN, max of 8 tabs in 24 hour time 240 tablet 1 Past Week at Unknown time  . DULoxetine (CYMBALTA) 30 MG capsule Take 90 mg daily (60 mg + 30 mg) (Patient taking differently: Take 30 mg by mouth daily. ) 30 capsule 0 11/11/2017 at Unknown time  . DULoxetine (CYMBALTA) 60 MG capsule Take 90 mg daily (60 mg + 30 mg) 30 capsule 0 11/15/2017 at Unknown time  . furosemide (LASIX) 40 MG tablet TAKE (1) TABLET BY MOUTH EACH MORNING. 90 tablet 3 11/10/2017 at Unknown time  . ibuprofen (ADVIL,MOTRIN) 600 MG tablet Take 600 mg by mouth 2 (two) times daily as needed for moderate pain.    10/29/2017 at Unknown time  . insulin aspart (NOVOLOG FLEXPEN) 100 UNIT/ML FlexPen Inject 15-21 Units into the skin 3 (three) times daily with meals. 5 pen 2 11/05/2017 at Unknown time  . INVOKANA 100 MG TABS tablet TAKE 1 TABLET BY MOUTH DAILY BEFORE BREAKFAST. 30 tablet 2 11/12/2017 at Unknown time  . LEVEMIR FLEXTOUCH 100 UNIT/ML Pen INJECT 80 UNITS UNDER THE SKIN EVERY DAY AT 10 PM 30 mL 2 11/02/2017 at Unknown time  . lisinopril (PRINIVIL,ZESTRIL) 5 MG tablet Take 5 mg by mouth daily.   11/07/2017 at Unknown time  . LORazepam (ATIVAN) 0.5 MG tablet Take 1 tablet (0.5 mg total) by mouth daily as needed for anxiety. 30 tablet 0 Past Week at Unknown time  . LYRICA 150 MG capsule take 135m by mouth three  times daily  0 11/05/2017 at Unknown time  . meloxicam (MOBIC) 15 MG tablet Take 15 mg by mouth daily.   10/29/2017 at Unknown time  . metFORMIN (GLUCOPHAGE) 1000 MG tablet TAKE 1 TABLET(1000 MG) BY MOUTH TWICE DAILY WITH A MEAL 60 tablet 3 11/10/2017 at Unknown time  . nitroGLYCERIN (NITROSTAT) 0.4 MG SL tablet Place 1 tablet (0.4 mg total) under the tongue every 5 (five) minutes as needed for chest pain. 30 tablet 0 unknown  . pantoprazole (PROTONIX) 40 MG tablet Take 1 tablet (40 mg total) by mouth 2 (two) times daily. 60 tablet 0 11/12/2017 at Unknown time  . potassium chloride (K-DUR) 10 MEQ tablet Take 1 tablet (10 mEq total) by mouth daily. 90 tablet 3 11/14/2017 at Unknown time  . pramipexole (MIRAPEX) 0.25 MG tablet Take 0.25 mg by mouth 2 (two) times daily.    11/12/2017 at Unknown time  . simvastatin (ZOCOR) 10 MG tablet TAKE (1) TABLET BY MOUTH DAILY. (Patient taking differently: TAKE (2) TABLET BY MOUTH DAILY.) 90 tablet 2 10/29/2017 at Unknown time  . tapentadol (NUCYNTA ER) 100 MG 12 hr tablet Take 100 mg by mouth every 12 (twelve) hours.   11/02/2017 at Unknown time  . tiZANidine (ZANAFLEX) 4  MG capsule Take 4 mg by mouth 3 (three) times daily.   11/18/2017 at Unknown time  . albuterol (PROVENTIL) (2.5 MG/3ML) 0.083% nebulizer solution Take 3 mLs (2.5 mg total) by nebulization every 6 (six) hours as needed for wheezing or shortness of breath. 75 mL 12 unknown  . colestipol (COLESTID) 1 g tablet Take 1 tablet (1 g total) by mouth 2 (two) times daily. (Patient not taking: Reported on 11/12/2017) 90 tablet 3 unknown  . inFLIXimab 500 mg in sodium chloride 0.9 % 200 mL Inject 500 mg into the vein every 8 (eight) weeks. 1 Dose 6 Taking  . OXYGEN Inhale 3 L/day into the lungs at bedtime.   Taking   Scheduled: . aspirin EC  81 mg Oral Daily  . atorvastatin  80 mg Oral q1800  . budesonide (PULMICORT) nebulizer solution  0.5 mg Nebulization BID  . chlorhexidine gluconate (MEDLINE KIT)  15 mL Mouth  Rinse BID  . Chlorhexidine Gluconate Cloth  6 each Topical Daily  . clopidogrel  75 mg Oral Daily  . feeding supplement (PRO-STAT SUGAR FREE 64)  60 mL Per Tube TID  . feeding supplement (VITAL HIGH PROTEIN)  1,000 mL Per Tube Q24H  . furosemide  40 mg Intravenous Once  . heparin injection (subcutaneous)  5,000 Units Subcutaneous Q8H  . insulin aspart  0-20 Units Subcutaneous Q4H  . insulin aspart  16 Units Subcutaneous Q4H  . insulin glargine  90 Units Subcutaneous Daily  . ipratropium  0.5 mg Nebulization Q6H  . levalbuterol  0.63 mg Nebulization Q6H  . mouth rinse  15 mL Mouth Rinse 10 times per day  . methylPREDNISolone (SOLU-MEDROL) injection  40 mg Intravenous Q8H  . metoprolol tartrate  25 mg Oral BID  . pantoprazole sodium  40 mg Per Tube QHS  . potassium chloride  10 mEq Oral Daily  . sodium chloride flush  10-40 mL Intracatheter Q12H  . tapentadol  100 mg Oral Q12H   Continuous: . propofol (DIPRIVAN) infusion Stopped (11/13/17 0730)   MNO:[id number] **OR** acetaminophen, fentaNYL (SUBLIMAZE) injection, fentaNYL (SUBLIMAZE) injection, levalbuterol, LORazepam, sodium chloride flush  Assesment: She was admitted with influenza A COPD exacerbation and diffuse bilateral pneumonia.  She had acute hypoxic respiratory failure requiring intubation and mechanical ventilation.  At baseline she has obstructive sleep apnea which may complicate things.  Her pneumonia is improving.  She did well with being able to breathe on her own yesterday and may be ready to extubate today.  She has acute heart failure probably related to her acute illness with a probable type II non-STEMI.  She has diuresed  She has diabetes and had diabetic ketoacidosis which is better     Active Problems:   DM type 2 causing vascular disease ([place])   Current smoker   ESOPHAGEAL STRICTURE   Esophageal reflux   Gastroparesis   Cardiac pacemaker in situ   COPD GOLD 0 / still smoking    OSA (obstructive sleep  apnea)   Diabetic neuropathy (HCC)   Dysphagia   Crohn's disease without complication (HCC)   COPD exacerbation (HCC)   Influenza A   Acute respiratory failure with hypoxia (HCC)   Hyponatremia   Hypokalemia    Plan: Try weaning today perhaps can extubate today    LOS: 10 days   Kelvin Burpee L 11/13/2017, 9:39 AM

## 2017-11-13 NOTE — Progress Notes (Signed)
Spoke with Dr. Ardyth Harps about BP increase to 136/101. Unable to give PO Lopressor due to OG tube being pulled out. Dr. Ardyth Harps verbal order to continue monitoring BP and if needed will order Lopressor IV.

## 2017-11-13 NOTE — Progress Notes (Signed)
Per respiratory therapy request, tube feeding turned off at 0745 in preparation for extubation. OG tube noted to be out of place at 83 cm external length of tube. OG tube pulled. Will not replace OG tube since plan is to extubate.

## 2017-11-13 NOTE — Progress Notes (Signed)
SLP Cancellation Note  Patient Details Name: [name] MRN: [id number] DOB: 12-24-1965   Cancelled treatment:       Reason Eval/Treat Not Completed: Fatigue/lethargy limiting ability to participate; Pt extubated earlier today. SLP will check back tomorrow for BSE.  Thank you,  Havery Moros, CCC-SLP [phone]    Minyon Billiter 11/13/2017, 7:30 PM

## 2017-11-13 NOTE — Procedures (Signed)
**Note De-Identified Olga Seyler Obfuscation** Extubation Procedure Note  Patient Details:   Name: [name] DOB: 1966/06/08 MRN: [id number]   Airway Documentation:     Evaluation  O2 sats: stable throughout Complications: No apparent complications Patient did tolerate procedure well. Bilateral Breath Sounds: Expiratory wheezes   No + leak Alexius Ellington, Megan Salon 11/13/2017, 10:17 AM

## 2017-11-14 ENCOUNTER — Inpatient Hospital Stay (HOSPITAL_COMMUNITY): Payer: Medicaid Other

## 2017-11-14 LAB — GLUCOSE, CAPILLARY
GLUCOSE-CAPILLARY: 164 mg/dL — AB (ref 65–99)
GLUCOSE-CAPILLARY: 85 mg/dL (ref 65–99)
Glucose-Capillary: 77 mg/dL (ref 65–99)
Glucose-Capillary: 97 mg/dL (ref 65–99)
Glucose-Capillary: 118 mg/dL — ABNORMAL HIGH (ref 65–99)
Glucose-Capillary: 191 mg/dL — ABNORMAL HIGH (ref 65–99)

## 2017-11-14 LAB — AMMONIA: Ammonia: 20 umol/L (ref 9–35)

## 2017-11-14 MED ORDER — CARVEDILOL 6.25 MG PO TABS
6.2500 mg | ORAL_TABLET | Freq: Two times a day (BID) | ORAL | Status: DC
  Administered 2017-11-15: 6.25 mg via ORAL
  Filled 2017-11-14: qty 1
  Filled 2017-11-14: qty 2

## 2017-11-14 MED ORDER — INSULIN GLARGINE 100 UNIT/ML ~~LOC~~ SOLN
10.0000 [IU] | Freq: Every day | SUBCUTANEOUS | Status: DC
  Administered 2017-11-14 – 2017-11-15 (×2): 10 [IU] via SUBCUTANEOUS
  Filled 2017-11-14 (×4): qty 0.1

## 2017-11-14 MED ORDER — FUROSEMIDE 10 MG/ML IJ SOLN
40.0000 mg | Freq: Once | INTRAMUSCULAR | Status: AC
  Administered 2017-11-14: 40 mg via INTRAVENOUS
  Filled 2017-11-14: qty 4

## 2017-11-14 MED ORDER — LORAZEPAM 2 MG/ML IJ SOLN
0.5000 mg | Freq: Four times a day (QID) | INTRAMUSCULAR | Status: DC | PRN
  Administered 2017-11-14 – 2017-11-21 (×4): 0.5 mg via INTRAVENOUS
  Filled 2017-11-14 (×5): qty 1

## 2017-11-14 MED ORDER — ORAL CARE MOUTH RINSE
15.0000 mL | Freq: Two times a day (BID) | OROMUCOSAL | Status: DC
  Administered 2017-11-14 – 2017-11-15 (×3): 15 mL via OROMUCOSAL

## 2017-11-14 MED ORDER — CHLORHEXIDINE GLUCONATE 0.12 % MT SOLN
15.0000 mL | Freq: Two times a day (BID) | OROMUCOSAL | Status: DC
  Administered 2017-11-14 – 2017-11-16 (×5): 15 mL via OROMUCOSAL
  Filled 2017-11-14 (×5): qty 15

## 2017-11-14 NOTE — Progress Notes (Signed)
Progress Note  Patient Name: [name] Date of Encounter: 11/14/2017  Primary Cardiologist: Dr. Jonelle Sidle  Subjective   Extubated yesterday.  Eyes are open and she follows me when I talk with her, but no verbal responses other than a moan.  Inpatient Medications    Scheduled Meds: . aspirin EC  81 mg Oral Daily  . atorvastatin  80 mg Oral q1800  . budesonide (PULMICORT) nebulizer solution  0.5 mg Nebulization BID  . chlorhexidine  15 mL Mouth Rinse BID  . Chlorhexidine Gluconate Cloth  6 each Topical Daily  . clopidogrel  75 mg Oral Daily  . feeding supplement (PRO-STAT SUGAR FREE 64)  60 mL Per Tube TID  . heparin injection (subcutaneous)  5,000 Units Subcutaneous Q8H  . insulin aspart  0-20 Units Subcutaneous Q4H  . insulin aspart  16 Units Subcutaneous Q4H  . insulin glargine  90 Units Subcutaneous Daily  . ipratropium  0.5 mg Nebulization Q6H  . levalbuterol  0.63 mg Nebulization Q6H  . mouth rinse  15 mL Mouth Rinse q12n4p  . methylPREDNISolone (SOLU-MEDROL) injection  40 mg Intravenous Q8H  . metoprolol tartrate  25 mg Oral BID  . pantoprazole sodium  40 mg Per Tube QHS  . potassium chloride  10 mEq Oral Daily  . sodium chloride flush  10-40 mL Intracatheter Q12H  . tapentadol  100 mg Oral Q12H   Continuous Infusions: . propofol (DIPRIVAN) infusion Stopped (11/13/17 0730)   PRN Meds: acetaminophen **OR** acetaminophen, levalbuterol, LORazepam, morphine injection, sodium chloride flush   Vital Signs    Vitals:   11/14/17 0600 11/14/17 0757 11/14/17 0802 11/14/17 0819  BP: (!) 137/92     Pulse: 89     Resp: 19     Temp:    98.3 F (36.8 C)  TempSrc:    Axillary  SpO2: 94% 92% 95%   Weight:      Height:        Intake/Output Summary (Last 24 hours) at 11/14/2017 0913 Last data filed at 11/14/2017 0500 Gross per 24 hour  Intake 10 ml  Output 2800 ml  Net -2790 ml   Filed Weights   11/12/17 0500 11/13/17 0400 11/14/17 0500  Weight: 202  lb 6.1 oz (91.8 kg) 198 lb 3.1 oz (89.9 kg) 187 lb 6.3 oz (85 kg)    Telemetry    Sinus rhythm.  Personally reviewed.  Physical Exam   GEN:  Chronically ill-appearing, no distress..   Neck: No JVD. Cardiac: RRR, no gallop.  Respiratory:  No active wheezing, scattered rhonchi anteriorly. GI: Soft, nontender, bowel sounds present. MS:  SCDs in place; No deformity. Neuro:  Does not move extremities to command, but some spontaneous movements noted. Psych: Eyes are open and follow voice.  No directed verbalizations.  Labs    Chemistry Recent Labs  Lab 11/11/17 0437 11/12/17 0556 11/13/17 0541  NA 146* 143 141  K 3.9 3.8 3.6  CL 120* 116* 110  CO2 17* 17* 20*  GLUCOSE 124* 65 98  BUN 64* 58* 61*  CREATININE 0.79 0.71 0.66  CALCIUM 8.5* 8.4* 8.6*  PROT 5.6* 5.5* 6.2*  ALBUMIN 2.1* 2.1* 2.2*  AST 63* 61* 63*  ALT 53 63* 75*  ALKPHOS 107 100 107  BILITOT 0.3 0.6 0.5  GFRNONAA >60 >60 >60  GFRAA >60 >60 >60  ANIONGAP 9 10 11      Hematology Recent Labs  Lab 11/10/17 0533 11/12/17 0556 11/13/17 0541  WBC 24.8*  22.7* 23.5*  RBC 4.16 4.09 4.50  HGB 12.5 12.4 13.4  HCT 39.5 38.5 41.7  MCV 95.0 94.1 92.7  MCH 30.0 30.3 29.8  MCHC 31.6 32.2 32.1  RDW 15.6* 15.0 14.9  PLT 219 173 152    Radiology    Dg Chest Port 1 View  Result Date: 11/13/2017 CLINICAL DATA:  Hypoxia.  COPD.  Pacemaker. EXAM: PORTABLE CHEST 1 VIEW COMPARISON:  11/12/2017. FINDINGS: Patient is rotated to the right. Endotracheal tube and NG tube in stable position. Cardiac pacer noted lead tips in stable position. Heart size normal. Mild increased density noted in the right upper lung, this may be from patient rotation, right upper lobe infiltrate may be present. Improved aeration in the lung bases. Small right pleural effusion. No pneumothorax. IMPRESSION: 1.  Lines and tubes in stable position. 2. Mild increase density noted the right upper lung. This may be related to patient rotation however right  upper lobe infiltrate may be present. 3.  Improved aeration both lung bases. Electronically Signed   By: Maisie Fus  Register   On: 11/13/2017 07:16    Cardiac Studies   Echocardiogram 11/04/2017: Study Conclusions  - Left ventricle: The cavity size was normal. Wall thickness was normal. Systolic function was moderately to severely reduced. The estimated ejection fraction was = 30%. - Regional wall motion abnormality: Akinesis of the mid-apical anterior, mid anteroseptal, apical inferior, apical septal, apical lateral, and apical myocardium. - Aortic valve: Mildly calcified annulus. Trileaflet; normal thickness leaflets. Valve area (VTI): 2.98 cm^2. Valve area (Vmax): 2.92 cm^2. Valve area (Vmean): 2.61 cm^2. - Mitral valve: Mildly calcified annulus. Normal thickness leaflets - Tricuspid valve: There was mild-moderate regurgitation. - Technically difficult study. Echcontrast used to Conservation officer, nature.  Impressions:  - Acute decrease in LVEF and new wall motion abnormalities since January 23,2019 study. Pattern of wall motion abnormalities typical of stress induced cardiomyopathy/Takotsubo syndrome, however this diagnosis requires exclusing of coronary artery disease.  Patient Profile     52 y.o. 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 [name], COPD, previous stroke, and hypertension. She is now being managed for hypoxic respiratory failure with COPD exacerbation and influenza A/pneumonia. 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  Assessment & Plan    1.  Suspected type II NSTEMI in the setting of stress-induced cardiomyopathy with peak troponin I of 1.68.  2.  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  LVEF reduced in comparison to study from January.  3.  Influenza A and COPD exacerbation.  4.  Hypoxic respiratory failure requiring ventilatory support, extubated yesterday with continued management by Dr. Juanetta Gosling.  5.  History of inappropriate sinus tachycardia status post sinus node modification and subsequent [employer] pacemaker placement by Dr. [name].  6.  History of stroke, continues on dual antiplatelet therapy.  7.  Encephalopathy, possibly metabolic in the setting of acute illness.  Continue cardiac regimen including aspirin, Lipitor, and Plavix.  Will convert from Lopressor to Coreg which she had been taking as an outpatient, and in light of cardiomyopathy.  Also give another dose of IV Lasix today with potassium supplement.  She has been diuresing well and renal function is stable with improvement in lung aeration by chest x-ray.  If blood pressure tolerates, consider adding low-dose ARB next.  Workup of encephalopathy per primary team.  Although cardiac catheterization had been mentioned as a possibility in earlier cardiology follow-up notes, would anticipate holding off on invasive cardiac evaluation at this time.  Depending on overall clinical stability and improvement in mental status, this can be reconsidered at a later date.  Signed, Nona Dell, MD  11/14/2017, 9:13 AM

## 2017-11-14 NOTE — Progress Notes (Addendum)
Subjective: She was admitted with influenza A and COPD exacerbation and acute heart failure.  She required intubation and mechanical ventilation.  She had diffuse bilateral pneumonia.  She was able to be extubated yesterday.  This morning she moans but does not attempt to speak.  She does follow me with her eyes.  Objective: Vital signs in last 24 hours: Temp:  [97.5 F (36.4 C)-98.6 F (37 C)] 97.5 F (36.4 C) (02/22 0400) Pulse Rate:  [68-103] 89 (02/22 0600) Resp:  [17-28] 19 (02/22 0600) BP: (116-152)/(81-113) 137/92 (02/22 0600) SpO2:  [88 %-97 %] 94 % (02/22 0600) FiO2 (%):  [40 %] 40 % (02/21 0755) Weight:  [85 kg (187 lb 6.3 oz)] 85 kg (187 lb 6.3 oz) (02/22 0500) Weight change: -4.9 kg (-12.8 oz) Last BM Date: 11/13/17  Intake/Output from previous day: 02/21 0701 - 02/22 0700 In: 24.3 [I.V.:16.8; NG/GT:7.5] Out: 2800 [Urine:2800]  PHYSICAL EXAM General appearance: Not speaking following with her eyes Resp: rhonchi bilaterally Cardio: Regular rhythm with some paced beats GI: soft, non-tender; bowel sounds normal; no masses,  no organomegaly Extremities: extremities normal, atraumatic, no cyanosis or edema  Lab Results:  Results for orders placed or performed during the hospital encounter of 11/11/2017 (from the past 48 hour(s))  Glucose, capillary     Status: None   Collection Time: 11/12/17  8:05 AM  Result Value Ref Range   Glucose-Capillary 73 65 - 99 mg/dL  Glucose, capillary     Status: Abnormal   Collection Time: 11/12/17 11:13 AM  Result Value Ref Range   Glucose-Capillary 132 (H) 65 - 99 mg/dL  Glucose, capillary     Status: Abnormal   Collection Time: 11/12/17  4:29 PM  Result Value Ref Range   Glucose-Capillary 186 (H) 65 - 99 mg/dL   Comment 1 Notify RN    Comment 2 Document in Chart   Glucose, capillary     Status: Abnormal   Collection Time: 11/12/17  8:19 PM  Result Value Ref Range   Glucose-Capillary 142 (H) 65 - 99 mg/dL   Comment 1 Notify RN     Comment 2 Document in Chart   Glucose, capillary     Status: Abnormal   Collection Time: 11/13/17 12:15 AM  Result Value Ref Range   Glucose-Capillary 108 (H) 65 - 99 mg/dL   Comment 1 Notify RN    Comment 2 Document in Chart   Glucose, capillary     Status: None   Collection Time: 11/13/17  4:25 AM  Result Value Ref Range   Glucose-Capillary 87 65 - 99 mg/dL   Comment 1 Notify RN    Comment 2 Document in Chart   Draw ABG 1 hour after initiation of ventilator     Status: Abnormal   Collection Time: 11/13/17  5:00 AM  Result Value Ref Range   FIO2 40.00    Delivery systems VENTILATOR    Mode ASSIST CONTROL    VT 500 mL   LHR 22 resp/min   Peep/cpap 5.0 cm H20   pH, Arterial 7.365 7.350 - 7.450   pCO2 arterial 35.4 32.0 - 48.0 mmHg   pO2, Arterial 90.0 83.0 - 108.0 mmHg   Bicarbonate 20.7 20.0 - 28.0 mmol/L   Acid-base deficit 4.7 (H) 0.0 - 2.0 mmol/L   O2 Saturation 95.6 %   Patient temperature 37.0    Collection site LEFT RADIAL    Drawn by [id number]    Sample type ARTERIAL    Allens test (  pass/fail) PASS PASS    Comment: Performed at [facility], [address], [place], [place] [address]  Comprehensive metabolic panel     Status: Abnormal   Collection Time: 11/13/17  5:41 AM  Result Value Ref Range   Sodium 141 135 - 145 mmol/L   Potassium 3.6 3.5 - 5.1 mmol/L   Chloride 110 101 - 111 mmol/L   CO2 20 (L) 22 - 32 mmol/L   Glucose, Bld 98 65 - 99 mg/dL   BUN 61 (H) 6 - 20 mg/dL   Creatinine, Ser 0.66 0.44 - 1.00 mg/dL   Calcium 8.6 (L) 8.9 - 10.3 mg/dL   Total Protein 6.2 (L) 6.5 - 8.1 g/dL   Albumin 2.2 (L) 3.5 - 5.0 g/dL   AST 63 (H) 15 - 41 U/L   ALT 75 (H) 14 - 54 U/L   Alkaline Phosphatase 107 38 - 126 U/L   Total Bilirubin 0.5 0.3 - 1.2 mg/dL   GFR calc non Af Amer >60 >60 mL/min   GFR calc Af Amer >60 >60 mL/min    Comment: (NOTE) The eGFR has been calculated using the CKD EPI equation. This calculation has not been validated in all clinical  situations. eGFR's persistently <60 mL/min signify possible Chronic Kidney Disease.    Anion gap 11 5 - 15    Comment: Performed at [facility], [address] [place], [place] [address]  Triglycerides     Status: Abnormal   Collection Time: 11/13/17  5:41 AM  Result Value Ref Range   Triglycerides 286 (H) <150 mg/dL    Comment: Performed at [facility], [address] [place], [place] [address]  CBC     Status: Abnormal   Collection Time: 11/13/17  5:41 AM  Result Value Ref Range   WBC 23.5 (H) 4.0 - 10.5 K/uL   RBC 4.50 3.87 - 5.11 MIL/uL   Hemoglobin 13.4 12.0 - 15.0 g/dL   HCT 41.7 36.0 - 46.0 %   MCV 92.7 78.0 - 100.0 fL   MCH 29.8 26.0 - 34.0 pg   MCHC 32.1 30.0 - 36.0 g/dL   RDW 14.9 11.5 - 15.5 %   Platelets 152 150 - 400 K/uL    Comment: Performed at [facility], [address], [place], [place] [address]  Glucose, capillary     Status: Abnormal   Collection Time: 11/13/17  7:37 AM  Result Value Ref Range   Glucose-Capillary 106 (H) 65 - 99 mg/dL  Glucose, capillary     Status: Abnormal   Collection Time: 11/13/17 11:20 AM  Result Value Ref Range   Glucose-Capillary 121 (H) 65 - 99 mg/dL  Glucose, capillary     Status: Abnormal   Collection Time: 11/13/17  4:24 PM  Result Value Ref Range   Glucose-Capillary 123 (H) 65 - 99 mg/dL  Glucose, capillary     Status: None   Collection Time: 11/13/17  7:42 PM  Result Value Ref Range   Glucose-Capillary 95 65 - 99 mg/dL   Comment 1 Notify RN   Glucose, capillary     Status: None   Collection Time: 11/13/17 11:51 PM  Result Value Ref Range   Glucose-Capillary 85 65 - 99 mg/dL   Comment 1 Notify RN   Glucose, capillary     Status: None   Collection Time: 11/14/17  3:49 AM  Result Value Ref Range   Glucose-Capillary 77 65 - 99 mg/dL   Comment 1 Notify RN  ABGS Recent Labs    11/13/17 0500  PHART 7.365  PO2ART 90.0  HCO3 20.7   CULTURES No results found for this or any previous visit (from the past 240  hour(s)). Studies/Results: Dg Chest 1 View  Result Date: 11/12/2017 CLINICAL DATA:  Followup ventilator support. EXAM: CHEST 1 VIEW COMPARISON:  11/11/2017 FINDINGS: Endotracheal tube tip is 4 cm above the carina. Nasogastric tube enters the abdomen. There continues to be moderate atelectasis in both lower lungs right more than left with possible fluid overload but no frank edema. IMPRESSION: Persistent lower lobe volume loss right more than left. Electronically Signed   By: Nelson Chimes M.D.   On: 11/12/2017 08:51   Dg Chest Port 1 View  Result Date: 11/13/2017 CLINICAL DATA:  Hypoxia.  COPD.  Pacemaker. EXAM: PORTABLE CHEST 1 VIEW COMPARISON:  11/12/2017. FINDINGS: Patient is rotated to the right. Endotracheal tube and NG tube in stable position. Cardiac pacer noted lead tips in stable position. Heart size normal. Mild increased density noted in the right upper lung, this may be from patient rotation, right upper lobe infiltrate may be present. Improved aeration in the lung bases. Small right pleural effusion. No pneumothorax. IMPRESSION: 1.  Lines and tubes in stable position. 2. Mild increase density noted the right upper lung. This may be related to patient rotation however right upper lobe infiltrate may be present. 3.  Improved aeration both lung bases. Electronically Signed   By: Marcello Moores  Register   On: 11/13/2017 07:16    Medications:  Prior to Admission:  Medications Prior to Admission  Medication Sig Dispense Refill Last Dose  . acetaminophen (TYLENOL) 500 MG tablet Take 500 mg by mouth every 6 (six) hours as needed for mild pain or moderate pain.   11/02/2017 at Unknown time  . aspirin EC 81 MG tablet Take 81 mg by mouth daily.   11/02/2017 at Unknown time  . carvedilol (COREG) 6.25 MG tablet Take 1 tablet (6.25 mg total) by mouth 2 (two) times daily. 180 tablet 3 11/06/2017 at Unknown time  . Cholecalciferol (VITAMIN D3) 5000 units CAPS Take 1 capsule (5,000 Units total) by mouth daily. 90  capsule 0 11/04/2017 at Unknown time  . clopidogrel (PLAVIX) 75 MG tablet Take 75 mg by mouth daily.   11/20/2017 at Unknown time  . diclofenac sodium (VOLTAREN) 1 % GEL Apply 4 g topically 4 (four) times daily. (Patient taking differently: Apply 2-4 g topically daily as needed (for leg pain). ) 200 g 11 Past Month at Unknown time  . dicyclomine (BENTYL) 20 MG tablet Take 1 tablet (20 mg total) by mouth every 8 (eight) hours as needed for spasms. 90 tablet 3 10/25/2017 at Unknown time  . diphenoxylate-atropine (LOMOTIL) 2.5-0.025 MG tablet Take 2 tabs by mouth x 1, then 1 tab every 4 hrs PRN, max of 8 tabs in 24 hour time 240 tablet 1 Past Week at Unknown time  . DULoxetine (CYMBALTA) 30 MG capsule Take 90 mg daily (60 mg + 30 mg) (Patient taking differently: Take 30 mg by mouth daily. ) 30 capsule 0 11/15/2017 at Unknown time  . DULoxetine (CYMBALTA) 60 MG capsule Take 90 mg daily (60 mg + 30 mg) 30 capsule 0 11/12/2017 at Unknown time  . furosemide (LASIX) 40 MG tablet TAKE (1) TABLET BY MOUTH EACH MORNING. 90 tablet 3 11/08/2017 at Unknown time  . ibuprofen (ADVIL,MOTRIN) 600 MG tablet Take 600 mg by mouth 2 (two) times daily as needed for moderate pain.  11/19/2017 at Unknown time  . insulin aspart (NOVOLOG FLEXPEN) 100 UNIT/ML FlexPen Inject 15-21 Units into the skin 3 (three) times daily with meals. 5 pen 2 11/15/2017 at Unknown time  . INVOKANA 100 MG TABS tablet TAKE 1 TABLET BY MOUTH DAILY BEFORE BREAKFAST. 30 tablet 2 10/28/2017 at Unknown time  . LEVEMIR FLEXTOUCH 100 UNIT/ML Pen INJECT 80 UNITS UNDER THE SKIN EVERY DAY AT 10 PM 30 mL 2 11/02/2017 at Unknown time  . lisinopril (PRINIVIL,ZESTRIL) 5 MG tablet Take 5 mg by mouth daily.   11/08/2017 at Unknown time  . LORazepam (ATIVAN) 0.5 MG tablet Take 1 tablet (0.5 mg total) by mouth daily as needed for anxiety. 30 tablet 0 Past Week at Unknown time  . LYRICA 150 MG capsule take 181m by mouth three times daily  0 10/28/2017 at Unknown time  .  meloxicam (MOBIC) 15 MG tablet Take 15 mg by mouth daily.   10/31/2017 at Unknown time  . metFORMIN (GLUCOPHAGE) 1000 MG tablet TAKE 1 TABLET(1000 MG) BY MOUTH TWICE DAILY WITH A MEAL 60 tablet 3 11/09/2017 at Unknown time  . nitroGLYCERIN (NITROSTAT) 0.4 MG SL tablet Place 1 tablet (0.4 mg total) under the tongue every 5 (five) minutes as needed for chest pain. 30 tablet 0 unknown  . pantoprazole (PROTONIX) 40 MG tablet Take 1 tablet (40 mg total) by mouth 2 (two) times daily. 60 tablet 0 10/31/2017 at Unknown time  . potassium chloride (K-DUR) 10 MEQ tablet Take 1 tablet (10 mEq total) by mouth daily. 90 tablet 3 11/17/2017 at Unknown time  . pramipexole (MIRAPEX) 0.25 MG tablet Take 0.25 mg by mouth 2 (two) times daily.    11/12/2017 at Unknown time  . simvastatin (ZOCOR) 10 MG tablet TAKE (1) TABLET BY MOUTH DAILY. (Patient taking differently: TAKE (2) TABLET BY MOUTH DAILY.) 90 tablet 2 11/15/2017 at Unknown time  . tapentadol (NUCYNTA ER) 100 MG 12 hr tablet Take 100 mg by mouth every 12 (twelve) hours.   11/02/2017 at Unknown time  . tiZANidine (ZANAFLEX) 4 MG capsule Take 4 mg by mouth 3 (three) times daily.   11/18/2017 at Unknown time  . albuterol (PROVENTIL) (2.5 MG/3ML) 0.083% nebulizer solution Take 3 mLs (2.5 mg total) by nebulization every 6 (six) hours as needed for wheezing or shortness of breath. 75 mL 12 unknown  . colestipol (COLESTID) 1 g tablet Take 1 tablet (1 g total) by mouth 2 (two) times daily. (Patient not taking: Reported on 11/20/2017) 90 tablet 3 unknown  . inFLIXimab 500 mg in sodium chloride 0.9 % 200 mL Inject 500 mg into the vein every 8 (eight) weeks. 1 Dose 6 Taking  . OXYGEN Inhale 3 L/day into the lungs at bedtime.   Taking   Scheduled: . aspirin EC  81 mg Oral Daily  . atorvastatin  80 mg Oral q1800  . budesonide (PULMICORT) nebulizer solution  0.5 mg Nebulization BID  . chlorhexidine  15 mL Mouth Rinse BID  . Chlorhexidine Gluconate Cloth  6 each Topical Daily  .  clopidogrel  75 mg Oral Daily  . feeding supplement (PRO-STAT SUGAR FREE 64)  60 mL Per Tube TID  . heparin injection (subcutaneous)  5,000 Units Subcutaneous Q8H  . insulin aspart  0-20 Units Subcutaneous Q4H  . insulin aspart  16 Units Subcutaneous Q4H  . insulin glargine  90 Units Subcutaneous Daily  . ipratropium  0.5 mg Nebulization Q6H  . levalbuterol  0.63 mg Nebulization Q6H  . mouth rinse  15 mL Mouth Rinse q12n4p  . methylPREDNISolone (SOLU-MEDROL) injection  40 mg Intravenous Q8H  . metoprolol tartrate  25 mg Oral BID  . pantoprazole sodium  40 mg Per Tube QHS  . potassium chloride  10 mEq Oral Daily  . sodium chloride flush  10-40 mL Intracatheter Q12H  . tapentadol  100 mg Oral Q12H   Continuous: . propofol (DIPRIVAN) infusion Stopped (11/13/17 0730)   [id number] **OR** acetaminophen, levalbuterol, LORazepam, morphine injection, sodium chloride flush  Assesment: She was admitted with acute hypoxic respiratory failure which was multifactorial.  She has influenza A bilateral pneumonia and acute heart failure.  She has COPD exacerbation is well and was able to come off the ventilator yesterday  She has altered mental status probably metabolic encephalopathy and is not speaking.  She has what is probably non-STEMI and stress-induced cardiomyopathy.  She had diabetic ketoacidosis treated with insulin and this has resolved Active Problems:   DM type 2 causing vascular disease ([place])   Current smoker   ESOPHAGEAL STRICTURE   Esophageal reflux   Gastroparesis   Cardiac pacemaker in situ   COPD GOLD 0 / still smoking    OSA (obstructive sleep apnea)   Diabetic neuropathy (H[facility])   Dysphagia   Crohn's disease without complication (HCC)   COPD exacerbation (H[facility])   Influenza A   Acute respiratory failure with hypoxia (H[facility])   Hyponatremia   Hypokalemia    Plan: Continue treatments.  She remains high risk for reintubation because of her altered mental status and  multiple medical problems    LOS: 11 days   Kaled Allende L 11/14/2017, 7:30 AM

## 2017-11-14 NOTE — Progress Notes (Addendum)
Inpatient Diabetes Program Recommendations  AACE/ADA: New Consensus Statement on Inpatient Glycemic Control (2015)  Target Ranges:  Prepandial:   less than 140 mg/dL      Peak postprandial:   less than 180 mg/dL (1-2 hours)      Critically ill patients:  140 - 180 mg/dL   Results for [name], [name] (MRN [id number]) as of 11/14/2017 12:58  Ref. Range 11/13/2017 23:51 11/14/2017 03:49 11/14/2017 07:51 11/14/2017 12:07  Glucose-Capillary Latest Ref Range: 65 - 99 mg/dL 85 77 97 409 (H)     Home DM Meds: Levemir 80 units QHS       Novolog 15-21 units TID       Invokana 100 mg daily       Metformin 1000 mg BID  Current Insulin Orders: Lantus 10 units daily      Novolog Resistant Correction Scale/ SSI (0-20 units) Q4 hours      Novolog  16 units Q4 hours    Note patient Extubated yesterday AM/ Tube feeds stopped.  Still getting Solumedrol 40 mg Q8 hours.  Note that Lantus reduced to 10 units daily today.     MD- Please consider the following in-hospital insulin adjustments:  1. May need to increase Lantus- Has been getting 90 units daily the last several days.  Recommend increasing Lantus to 40 units daily (50% total home dose)  2. Please discontinue Novolog 16 units Q4 hours (Tube feed coverage)  3. Please also reduce Novolog SSI to Moderate scale (0-15 units) Q4 hours      --Will follow patient during hospitalization--  Ambrose Finland RN, MSN, CDE Diabetes Coordinator Inpatient Glycemic Control Team Team Pager: [phone] (8a-5p)

## 2017-11-14 NOTE — Progress Notes (Signed)
Nutrition Brief Note  RD consulted for assessment.   Patient is already being followed by clinical nutrition as she was maintained on tube feeding support this past week.   She was successfully extubated yesterday, however, her mental status is still poor enough to the point oral intake is not safe. She was unable to undergo SLP eval today.   Given NPO status and lack of OGT, RD unable to implement any interventions at this time.   Will monitor for diet advancement and perform full follow up assessment next week, if the patient is still admitted.   She had met her pro/kcal needs for the last week and there is no indication for restarting nutritional support at this time.   No nutrition interventions warranted at this time. If nutrition issues arise, please consult RD.   Burtis Junes RD, LDN, CNSC Clinical Nutrition Pager: [phone] 11/14/2017 3:48 PM

## 2017-11-14 NOTE — Progress Notes (Signed)
PROGRESS NOTE    [name]  BJY:[id number] DOB: [birth year] DOA: 11/05/2017 PCP: [facility]     Brief Narrative:  52 year old woman admitted from home on 2/11 due to shortness of breath.  She apparently ran out of her breathing treatments, began to become short of breath a few days prior to admission and also developed a cough with yellow sputum.  In the ED she was found to be positive for influenza A O2 sats were in the 80s.  Of note patient was also found to be in DKA and 2D echo showed an ejection fraction of 30% with concerns for Takotsubo cardiomyopathy.  She was subsequently intubated.  Extubated on 2/21.  With metabolic encephalopathy   Assessment & Plan:   Active Problems:   DM type 2 causing vascular disease (HCC)   Current smoker   ESOPHAGEAL STRICTURE   Esophageal reflux   Gastroparesis   Cardiac pacemaker in situ   COPD GOLD 0 / still smoking    OSA (obstructive sleep apnea)   Diabetic neuropathy (HCC)   Dysphagia   Crohn's disease without complication (HCC)   COPD exacerbation (HCC)   Influenza A   Acute respiratory failure with hypoxia (HCC)   Hyponatremia   Hypokalemia   Acute on chronic hypoxemic respiratory failure, ventilatory dependent -Presumed due to a combination of influenza A, pneumonia as well as COPD with exacerbation. -Extubated on 2/21. -Appreciate Dr. Juanetta Gosling input and recommendations regarding ventilatory management. -Continue cefepime for planned duration of 8 days. -Continue steroids, nebs. -Completed course of Tamiflu for influenza A.  Acute  encephalopathy, presumed metabolic -She is unable to follow commands, opens eyes to voice but that is the extent of her interaction. -She has been off sedating medications for over 24 hours. -We will send for CT scan of the head.  Sepsis -Due to above, sepsis parameters improving.  DKA -Resolved. -In the setting of steroid use and infection and possibly Invokana. -She  has now been transitioned to sliding scale with Lantus and is well controlled.  Non-STEMI -Troponin peaked at 1.68.   -continue aspirin, Plavix, Lipitor. -Reviewed echo from February 12: Ejection fraction of 30% with akinesis of the mid apical anterior, mid anteroseptal, apical inferior, apical septal, apical lateral and apical myocardium. 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 -Cardiology is involved. - Lopressor has been added due to improvement in blood pressure. -Additional dose of IV Lasix ordered for today given adequate diuresis and improvement in chest x-ray. -Cardiology following  Hypernatremia -Resolved.   DVT prophylaxis: Subcutaneous heparin Code Status: Full code Family Communication: No family at bedside Disposition Plan: Continue weaning efforts  Consultants:   Cardiology  Pulmonology  Procedures:   Echo as above  Antimicrobials:  Anti-infectives (From admission, onward)   Start     Dose/Rate Route Frequency Ordered Stop   11/04/17 2000  levofloxacin (LEVAQUIN) IVPB 750 mg  Status:  Discontinued     750 mg 100 mL/hr over 90 Minutes Intravenous Every 24 hours 10/28/2017 2041 11/06/17 0939   11/04/17 1000  ceFEPIme (MAXIPIME) 1 g in sodium chloride 0.9 % 100 mL IVPB     1 g 200 mL/hr over 30 Minutes Intravenous Every 8 hours 11/04/17 0814 11/12/17 0243   11/04/17 0500  vancomycin (VANCOCIN) 1,250 mg in sodium chloride 0.9 % 250 mL IVPB  Status:  Discontinued     1,250 mg 166.7 mL/hr over 90 Minutes Intravenous Every 12 hours 10/30/2017 2041 11/06/17 0939   11/19/2017  2345  oseltamivir (TAMIFLU) capsule 75 mg     75 mg Oral 2 times daily Nov 24, 2017 2336 11/08/17 1135   12/14/2017 2000  oseltamivir (TAMIFLU) capsule 75 mg     75 mg Oral  Once 11/30/2017 1952 Nov 24, 2017 2010   11/22/2017 1800  vancomycin (VANCOCIN) IVPB 1000 mg/200 mL premix     1,000 mg 200 mL/hr over 60 Minutes Intravenous  Once November 24, 2017 1755 24-Nov-2017 1957   11/22/2017  1800  levofloxacin (LEVAQUIN) IVPB 750 mg     750 mg 100 mL/hr over 90 Minutes Intravenous  Once 12/18/2017 1755 12/20/2017 1957       Subjective: Extubated, eyes open does not respond to commands  Objective: Vitals:   11/14/17 1426 11/14/17 1500 11/14/17 1600 11/14/17 1700  BP:  111/86 111/87 (!) 117/91  Pulse:  (!) 102 (!) 104 (!) 106  Resp:  (!) 21 17 20   Temp:   97.6 F (36.4 C)   TempSrc:   Axillary   SpO2: 96% 93% 92% 94%  Weight:      Height:        Intake/Output Summary (Last 24 hours) at 11/14/2017 1858 Last data filed at 11/14/2017 1705 Gross per 24 hour  Intake 10 ml  Output 2300 ml  Net -2290 ml   Filed Weights   11/12/17 0500 11/13/17 0400 11/14/17 0500  Weight: 91.8 kg (202 lb 6.1 oz) 89.9 kg (198 lb 3.1 oz) 85 kg (187 lb 6.3 oz)    Examination:  General exam: Awake, nonverbal, unable to follow commands Respiratory system: Clear to auscultation. Respiratory effort normal. Cardiovascular system:RRR. No murmurs, rubs, gallops. Gastrointestinal system: Abdomen is nondistended, soft and nontender. No organomegaly or masses felt. Normal bowel sounds heard. Central nervous system: Unable to assess given current mental state Extremities: Trace to 1+ pitting edema bilaterally, positive pulses Psychiatry: Unable to assess given current mental state      Data Reviewed: I have personally reviewed following labs and imaging studies  CBC: Recent Labs  Lab 11/08/17 0433 11/09/17 0600 11/10/17 0533 11/12/17 0556 11/13/17 0541  WBC 22.3* 25.5* 24.8* 22.7* 23.5*  NEUTROABS  --   --   --  19.5*  --   HGB 12.6 12.3 12.5 12.4 13.4  HCT 38.2 37.4 39.5 38.5 41.7  MCV 93.9 92.6 95.0 94.1 92.7  PLT 201 199 219 173 152   Basic Metabolic Panel: Recent Labs  Lab 11/09/17 0600 11/10/17 0533 11/11/17 0437 11/12/17 0556 11/13/17 0541  NA 146* 150* 146* 143 141  K 3.8 3.8 3.9 3.8 3.6  CL 120* 122* 120* 116* 110  CO2 17* 16* 17* 17* 20*  GLUCOSE 174* 250* 124*  65 98  BUN 62* 71* 64* 58* 61*  CREATININE 0.87 0.83 0.79 0.71 0.66  CALCIUM 8.5* 8.6* 8.5* 8.4* 8.6*   GFR: Estimated Creatinine Clearance: 87.7 mL/min (by C-G formula based on SCr of 0.66 mg/dL). Liver Function Tests: Recent Labs  Lab 11/09/17 0600 11/10/17 0533 11/11/17 0437 11/12/17 0556 11/13/17 0541  AST 34 53* 63* 61* 63*  ALT 27 41 53 63* 75*  ALKPHOS 64 102 107 100 107  BILITOT 0.7 0.4 0.3 0.6 0.5  PROT 5.9* 5.5* 5.6* 5.5* 6.2*  ALBUMIN 2.1* 2.0* 2.1* 2.1* 2.2*   No results for input(s): LIPASE, AMYLASE in the last 168 hours. Recent Labs  Lab 11/14/17 1008  AMMONIA 20   Coagulation Profile: No results for input(s): INR, PROTIME in the last 168 hours. Cardiac Enzymes: No results for input(s):  CKTOTAL, CKMB, CKMBINDEX, TROPONINI in the last 168 hours. BNP (last 3 results) No results for input(s): PROBNP in the last 8760 hours. HbA1C: No results for input(s): HGBA1C in the last 72 hours. CBG: Recent Labs  Lab 11/13/17 2351 11/14/17 0349 11/14/17 0751 11/14/17 1207 11/14/17 1611  GLUCAP 85 77 97 118* 164*   Lipid Profile: Recent Labs    11/13/17 0541  TRIG 286*   Thyroid Function Tests: No results for input(s): TSH, T4TOTAL, FREET4, T3FREE, THYROIDAB in the last 72 hours. Anemia Panel: No results for input(s): VITAMINB12, FOLATE, FERRITIN, TIBC, IRON, RETICCTPCT in the last 72 hours. Urine analysis:    Component Value Date/Time   COLORURINE YELLOW 11/15/2017 2130   APPEARANCEUR CLOUDY (A) 11/15/2017 2130   LABSPEC 1.022 11/17/2017 2130   PHURINE 6.0 11/18/2017 2130   GLUCOSEU >=500 (A) 11/18/2017 2130   HGBUR SMALL (A) 10/27/2017 2130   BILIRUBINUR NEGATIVE 11/04/2017 2130   BILIRUBINUR neg 03/23/2015 1735   KETONESUR 20 (A) 11/02/2017 2130   PROTEINUR 100 (A) 11/20/2017 2130   UROBILINOGEN 0.2 04/01/2015 0633   NITRITE NEGATIVE 11/09/2017 2130   LEUKOCYTESUR NEGATIVE 11/04/2017 2130   Sepsis  Labs: @LABRCNTIP (procalcitonin:4,lacticidven:4)  ) No results found for this or any previous visit (from the past 240 hour(s)).       Radiology Studies: Ct Head Wo Contrast  Result Date: 11/14/2017 CLINICAL DATA:  Altered level of consciousness. EXAM: CT HEAD WITHOUT CONTRAST TECHNIQUE: Contiguous axial images were obtained from the base of the skull through the vertex without intravenous contrast. COMPARISON:  None. FINDINGS: Brain: No evidence of acute infarction, hemorrhage, hydrocephalus, extra-axial collection or mass lesion/mass effect. Vascular: Negative for hyperdense vessel Skull: Negative Sinuses/Orbits: Retention cyst left maxillary sinus. Remaining sinuses clear. Normal orbit Other: None IMPRESSION: Negative CT of the brain Electronically Signed   By: Marlan Palau M.D.   On: 11/14/2017 11:42   Dg Chest Port 1 View  Result Date: 11/13/2017 CLINICAL DATA:  Hypoxia.  COPD.  Pacemaker. EXAM: PORTABLE CHEST 1 VIEW COMPARISON:  11/12/2017. FINDINGS: Patient is rotated to the right. Endotracheal tube and NG tube in stable position. Cardiac pacer noted lead tips in stable position. Heart size normal. Mild increased density noted in the right upper lung, this may be from patient rotation, right upper lobe infiltrate may be present. Improved aeration in the lung bases. Small right pleural effusion. No pneumothorax. IMPRESSION: 1.  Lines and tubes in stable position. 2. Mild increase density noted the right upper lung. This may be related to patient rotation however right upper lobe infiltrate may be present. 3.  Improved aeration both lung bases. Electronically Signed   By: Maisie Fus  Register   On: 11/13/2017 07:16        Scheduled Meds: . aspirin EC  81 mg Oral Daily  . atorvastatin  80 mg Oral q1800  . budesonide (PULMICORT) nebulizer solution  0.5 mg Nebulization BID  . carvedilol  6.25 mg Oral BID WC  . chlorhexidine  15 mL Mouth Rinse BID  . Chlorhexidine Gluconate Cloth  6 each  Topical Daily  . clopidogrel  75 mg Oral Daily  . feeding supplement (PRO-STAT SUGAR FREE 64)  60 mL Per Tube TID  . heparin injection (subcutaneous)  5,000 Units Subcutaneous Q8H  . insulin aspart  0-20 Units Subcutaneous Q4H  . insulin aspart  16 Units Subcutaneous Q4H  . insulin glargine  10 Units Subcutaneous Daily  . ipratropium  0.5 mg Nebulization Q6H  . levalbuterol  0.63  mg Nebulization Q6H  . mouth rinse  15 mL Mouth Rinse q12n4p  . methylPREDNISolone (SOLU-MEDROL) injection  40 mg Intravenous Q8H  . pantoprazole sodium  40 mg Per Tube QHS  . potassium chloride  10 mEq Oral Daily  . sodium chloride flush  10-40 mL Intracatheter Q12H  . tapentadol  100 mg Oral Q12H   Continuous Infusions: . propofol (DIPRIVAN) infusion Stopped (11/13/17 0730)     LOS: 11 days    Tme spent: 35 minutes.     Chaya Jan, MD [facility] Pager [phone]  If 7PM-7AM, please contact night-coverage [email or web address] Password [facility] 11/14/2017, 6:58 PM

## 2017-11-14 NOTE — Evaluation (Signed)
Clinical/Bedside Swallow Evaluation Patient Details  Name: [name] MRN: [id number] Date of Birth: 05/22/1966  Today's Date: 11/14/2017 Time: SLP Start Time (ACUTE ONLY): 0700 SLP Stop Time (ACUTE ONLY): 0719 SLP Time Calculation (min) (ACUTE ONLY): 19 min  Past Medical History:  Past Medical History:  Diagnosis Date  . Anxiety   . Arthritis   . Cardiac pacemaker in situ 2009   DDD [employer] -- [place] 60  . COPD with asthma (HCC)    GOLD 2-3 --  pulmologist (last visit 2011) Dr. Marchelle Gearing  . Crohn's disease (HCC)    Large intestine  . Depression 2016   PTSD  . Essential hypertension   . Family history of adverse reaction to anesthesia    father- stop breathing surgery   . Gastroparesis   . GERD (gastroesophageal reflux disease)   . History of hiatal hernia   . History of kidney stones   . History of stroke    Jun 2011 -- right hand weakness  . History of syncope   . Inappropriate sinus tachycardia    Sinus node modification 02-25-2003 by Dr. Lewayne Bunting  . OSA (obstructive sleep apnea)    Study done 2005 -- pt refused CPAP/previously was using nocturnal oxygen until one year ago pt states PCP is monitoring pt without  . Pelvic pain in female   . PTSD (post-traumatic stress disorder)   . Sinus node dysfunction (HCC)    Symptomatic bradycardia  . Type 2 diabetes mellitus (HCC)   . Wears glasses    Past Surgical History:  Past Surgical History:  Procedure Laterality Date  . 24 HOUR PH STUDY N/A 12/30/2016   Procedure: 24 HOUR PH STUDY;  Surgeon: Ruffin Frederick, MD;  Location: [facility];  Service: Gastroenterology;  Laterality: N/A;  . ABDOMINAL HYSTERECTOMY    . BILATERAL KNEE ARTHROSCOPY W/ CHONDROMALACIA PATELLA  bilateral ---- 12-26-2010; 08-14-2009;  04-25-2008;  03-09-2007   additional same surgery, Left knee 2005;  x2 2006 ---  Right knee 2005;  2006;  x2  2007  . CARDIAC CATHETERIZATION  05-08-2001  dr Nicki Gu[place]   normal coronary  arteries and LVF  . CARDIAC ELECTROPHYSIOLOGY STUDY W/  SINUS NODE MODIFICATION  02-25-2003  dr Sharlot Gowda taylor  . CARDIAC PACEMAKER PLACEMENT  10-16-2007  dr Lewayne Bunting   DDD-- [facility] 60  . CARDIOVASCULAR STRESS TEST  08-02-2010  dr Diona Browner   normal lexiscan study/  ef 59%  . CESAREAN SECTION  x2  . CHOLECYSTECTOMY  1994  . COLONOSCOPY N/A 05/23/2017   Procedure: COLONOSCOPY;  Surgeon: Benancio Deeds, MD;  Location: [facility];  Service: Gastroenterology;  Laterality: N/A;  . COLONOSCOPY WITH PROPOFOL N/A 09/27/2016   Procedure: COLONOSCOPY WITH PROPOFOL;  Surgeon: Ruffin Frederick, MD;  Location: [facility];  Service: Gastroenterology;  Laterality: N/A;  . CYSTO/  URETEROSCOPIC STONE EXTRACTION  03/ 2005  . CYSTOSCOPY WITH HYDRODISTENSION AND BIOPSY N/A 05/23/2015   Procedure: CYSTOSCOPY/BIOPSY/HYDRODISTENSION;  Surgeon: Su Grand, MD;  Location[facility];  Service: Urology;  Laterality: N/A;  . ESOPHAGEAL MANOMETRY N/A 12/30/2016   Procedure: ESOPHAGEAL MANOMETRY (EM);  Surgeon: Ruffin Frederick, MD;  Location: [facility];  Service: Gastroenterology;  Laterality: N/A;  . ESOPHAGOGASTRODUODENOSCOPY (EGD) WITH PROPOFOL N/A 09/27/2016   Procedure: ESOPHAGOGASTRODUODENOSCOPY (EGD) WITH PROPOFOL;  Surgeon: Ruffin Frederick, MD;  Location: [facility];  Service: Gastroenterology;  Laterality: N/A;  . ESOPHAGOGASTRODUODENOSCOPY (EGD) WITH PROPOFOL N/A 05/23/2017   Procedure: ESOPHAGOGASTRODUODENOSCOPY (EGD) WITH  PROPOFOL;  Surgeon: Benancio Deeds, MD;  Location: [facility];  Service: Gastroenterology;  Laterality: N/A;  . MULTIPLE EXTRACTIONS WITH ALVEOLOPLASTY    . SAVORY DILATION N/A 05/23/2017   Procedure: SAVORY DILATION;  Surgeon: Benancio Deeds, MD;  Location: [facility];  Service: Gastroenterology;  Laterality: N/A;  . TONSILLECTOMY  11-25-2002   HPI:  52 year old woman admitted from home on 2/11 due to shortness  of breath.  She apparently ran out of her breathing treatments, began to become short of breath a few days prior to admission and also developed a cough with yellow sputum.  In the ED she was found to be positive for influenza A O2 sats were in the 80s.  Of note patient was also found to be in DKA and 2D echo showed an ejection fraction of 30% with concerns for Takotsubo cardiomyopathy.  She was subsequently intubated.  Extubated on 2/21.   Assessment / Plan / Recommendation Clinical Impression  Pt unable to sustain sufficient level of alertness for safe po intake. She was rousable and intermittently followed commands related to oral motor tasks, but dozed off. Pt immediately protruded her tongue when asked if she wanted something to eat/drink. Pt accepted single ice chips with moderate verbal and tactile cues to manipulate bolus. Eventual palpation of hyolaryngeal movement noted followed by delayed coughing. Pt is not yet ready for po due to altered mental status. Continue NPO with SLP to follow during acute stay.   SLP Visit Diagnosis: Dysphagia, oropharyngeal phase (R13.12)    Aspiration Risk  Severe aspiration risk;Risk for inadequate nutrition/hydration    Diet Recommendation NPO;Ice chips PRN after oral care   Medication Administration: Via alternative means    Other  Recommendations Oral Care Recommendations: Oral care QID;Oral care prior to ice chip/H20;Staff/trained caregiver to provide oral care Other Recommendations: Have oral suction available   Follow up Recommendations (pending)      Frequency and Duration min 2x/week  1 week       Prognosis Prognosis for Safe Diet Advancement: Guarded Barriers to Reach Goals: Cognitive deficits;Medication      Swallow Study   General Date of Onset: 10/29/2017 HPI: 52 year old woman admitted from home on 2/11 due to shortness of breath.  She apparently ran out of her breathing treatments, began to become short of breath a few days prior  to admission and also developed a cough with yellow sputum.  In the ED she was found to be positive for influenza A O2 sats were in the 80s.  Of note patient was also found to be in DKA and 2D echo showed an ejection fraction of 30% with concerns for Takotsubo cardiomyopathy.  She was subsequently intubated.  Extubated on 2/21. Type of Study: Bedside Swallow Evaluation Previous Swallow Assessment: none on record Diet Prior to this Study: NPO Temperature Spikes Noted: No Respiratory Status: Nasal cannula History of Recent Intubation: Yes Length of Intubations (days): 10 days Date extubated: 11/13/17 Behavior/Cognition: Lethargic/Drowsy Oral Cavity Assessment: Within Functional Limits Oral Care Completed by SLP: Yes Vision: Impaired for self-feeding Self-Feeding Abilities: Total assist Patient Positioning: Upright in bed;Postural control interferes with function Baseline Vocal Quality: Normal Volitional Cough: Congested;Weak Volitional Swallow: Unable to elicit    Oral/Motor/Sensory Function Overall Oral Motor/Sensory Function: Within functional limits   Ice Chips Ice chips: Impaired Presentation: Spoon Oral Phase Impairments: Reduced lingual movement/coordination;Poor awareness of bolus Oral Phase Functional Implications: Right anterior spillage;Oral holding Pharyngeal Phase Impairments: Suspected delayed Swallow;Cough - Delayed   Thin Liquid Thin  Liquid: Not tested    Nectar Thick Nectar Thick Liquid: Not tested   Honey Thick Honey Thick Liquid: Not tested   Puree Puree: Not tested   Solid   Thank you,  Havery Moros, CCC-SLP [phone]    Solid: Not tested        Velinda Wrobel 11/14/2017,7:51 AM

## 2017-11-15 ENCOUNTER — Inpatient Hospital Stay (HOSPITAL_COMMUNITY): Payer: Medicaid Other

## 2017-11-15 LAB — CBC
HCT: 51.2 % — ABNORMAL HIGH (ref 36.0–46.0)
Hemoglobin: 16.6 g/dL — ABNORMAL HIGH (ref 12.0–15.0)
MCH: 30.5 pg (ref 26.0–34.0)
MCHC: 32.4 g/dL (ref 30.0–36.0)
MCV: 94.1 fL (ref 78.0–100.0)
Platelets: 113 10*3/uL — ABNORMAL LOW (ref 150–400)
RBC: 5.44 MIL/uL — AB (ref 3.87–5.11)
RDW: 14.9 % (ref 11.5–15.5)
WBC: 27.5 10*3/uL — ABNORMAL HIGH (ref 4.0–10.5)

## 2017-11-15 LAB — COMPREHENSIVE METABOLIC PANEL
ALK PHOS: 228 U/L — AB (ref 38–126)
ALT: 125 U/L — ABNORMAL HIGH (ref 14–54)
ANION GAP: 15 (ref 5–15)
AST: 95 U/L — ABNORMAL HIGH (ref 15–41)
Albumin: 2.9 g/dL — ABNORMAL LOW (ref 3.5–5.0)
BUN: 74 mg/dL — ABNORMAL HIGH (ref 6–20)
CALCIUM: 9.4 mg/dL (ref 8.9–10.3)
CO2: 22 mmol/L (ref 22–32)
Chloride: 111 mmol/L (ref 101–111)
Creatinine, Ser: 0.84 mg/dL (ref 0.44–1.00)
GFR calc Af Amer: >60 mL/min (ref >60)
GFR calc non Af Amer: >60 mL/min (ref >60)
Glucose, Bld: 172 mg/dL — ABNORMAL HIGH (ref 65–99)
Potassium: 4.4 mmol/L (ref 3.5–5.1)
SODIUM: 148 mmol/L — AB (ref 135–145)
Total Bilirubin: 1 mg/dL (ref 0.3–1.2)
Total Protein: 7.6 g/dL (ref 6.5–8.1)

## 2017-11-15 LAB — BLOOD GAS, ARTERIAL
ACID-BASE DEFICIT: 1.5 mmol/L (ref 0.0–2.0)
Bicarbonate: 24.4 mmol/L (ref 20.0–28.0)
DRAWN BY: 105551
O2 CONTENT: 9 L/min
O2 SAT: 91.8 %
PCO2 ART: 27.1 mmHg — AB (ref 32.0–48.0)
Patient temperature: 102.2
pH, Arterial: 7.505 — ABNORMAL HIGH (ref 7.350–7.450)
pO2, Arterial: 66.9 mmHg — ABNORMAL LOW (ref 83.0–108.0)

## 2017-11-15 LAB — GLUCOSE, CAPILLARY
GLUCOSE-CAPILLARY: 196 mg/dL — AB (ref 65–99)
GLUCOSE-CAPILLARY: 291 mg/dL — AB (ref 65–99)
Glucose-Capillary: 177 mg/dL — ABNORMAL HIGH (ref 65–99)
Glucose-Capillary: 213 mg/dL — ABNORMAL HIGH (ref 65–99)
Glucose-Capillary: 228 mg/dL — ABNORMAL HIGH (ref 65–99)
Glucose-Capillary: 337 mg/dL — ABNORMAL HIGH (ref 65–99)

## 2017-11-15 LAB — AMMONIA: AMMONIA: 53 umol/L — AB (ref 9–35)

## 2017-11-15 MED ORDER — JEVITY 1.2 CAL PO LIQD
1000.0000 mL | ORAL | Status: DC
  Administered 2017-11-15: 1000 mL
  Filled 2017-11-15: qty 237
  Filled 2017-11-15 (×3): qty 1000

## 2017-11-15 MED ORDER — METHYLPREDNISOLONE SODIUM SUCC 40 MG IJ SOLR
40.0000 mg | Freq: Two times a day (BID) | INTRAMUSCULAR | Status: DC
  Administered 2017-11-15 – 2017-11-17 (×2): 40 mg via INTRAVENOUS
  Filled 2017-11-15 (×2): qty 1

## 2017-11-15 MED ORDER — FREE WATER
200.0000 mL | Freq: Four times a day (QID) | Status: DC
  Administered 2017-11-15 – 2017-11-16 (×2): 200 mL

## 2017-11-15 NOTE — Progress Notes (Signed)
NG tube placement ordered today. Attempted by two individual nurses to place a 12 french NG but failed each time the patient would cough and try to push out tube. I then got an 7 french and that tube was able to be placed in left nare. X-ray ordered by protocol to verify placement. Tube feeding and pills were able to be given after placement.

## 2017-11-15 NOTE — Progress Notes (Signed)
SLP Cancellation Note  Patient Details Name: [name] MRN: [id number] DOB: 1965/11/21   Cancelled treatment:       Reason Eval/Treat Not Completed: Fatigue/lethargy limiting ability to participate; Pt encephalopathic with limited engagement/alertness. NG recently placed. SLP communicated with RD, Christophe Louis, who stated he will follow for tube feed orders.   Thank you,  Havery Moros, CCC-SLP [phone]    PORTER,DABNEY 11/15/2017, 2:13 PM

## 2017-11-15 NOTE — Progress Notes (Signed)
PROGRESS NOTE    [name]  ZOX:[id number] DOB: 1966-05-22 DOA: 11/07/2017 PCP: [facility], [facility]     Brief Narrative:  52 year old woman admitted from home on 2/11 due to shortness of breath.  She apparently ran out of her breathing treatments, began to become short of breath a few days prior to admission and also developed a cough with yellow sputum.  In the ED she was found to be positive for influenza A O2 sats were in the 80s.  Of note patient was also found to be in DKA and 2D echo showed an ejection fraction of 30% with concerns for Takotsubo cardiomyopathy.  She was subsequently intubated.  Extubated on 2/21.  With significant encephalopathy post extubation.  Assessment & Plan:   Active Problems:   DM type 2 causing vascular disease (HCC)   Current smoker   ESOPHAGEAL STRICTURE   Esophageal reflux   Gastroparesis   Cardiac pacemaker in situ   COPD GOLD 0 / still smoking    OSA (obstructive sleep apnea)   Diabetic neuropathy (HCC)   Dysphagia   Crohn's disease without complication (HCC)   COPD exacerbation (HCC)   Influenza A   Acute respiratory failure with hypoxia (HCC)   Hyponatremia   Hypokalemia   Acute on chronic hypoxemic respiratory failure, ventilatory dependent -Presumed due to a combination of influenza A, pneumonia as well as COPD with exacerbation. -Extubated on 2/21. -Appreciate Dr. Juanetta Gosling input and recommendations regarding ventilatory management. -Continue cefepime for planned duration of 8 days. -Continue steroids, nebs. -Completed course of Tamiflu for influenza A.  Acute  encephalopathy, presumed metabolic -She is unable to follow commands, opens eyes to voice but that is the extent of her interaction. -She has been off sedating medications for over 48 hours. -CT [name] negative for acute changes. -No signs of active infection. -We will request neurology consultation when available.  Protein caloric malnutrition -In the  setting of prolonged critical illness with ventilatory dependency and now acute encephalopathy prohibiting swallowing. -Will place NG tube and start tube feeds. -Will request palliative care consultation when available to help [place] further determine goals of care in this patient.  Transaminitis -Suspect likely due to overall acute critical illness and sepsis. -Nonetheless will check right upper quadrant ultrasound.  Sepsis -Due to above, sepsis parameters improving.  DKA -Resolved. -In the setting of steroid use and infection and possibly Invokana. -She has now been transitioned to sliding scale with Lantus and is well controlled.  Non-STEMI -Troponin peaked at 1.68.   -continue aspirin, Plavix, Lipitor. -Reviewed echo from February 12: Ejection fraction of 30% with akinesis of the mid apical anterior, mid anteroseptal, apical inferior, apical septal, apical lateral and apical myocardium. 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 -Cardiology is involved. - Lopressor has been added due to improvement in blood pressure. -Additional dose of IV Lasix ordered for today given adequate diuresis and improvement in chest x-ray. -Cardiology following  Hypernatremia -Resolved.   DVT prophylaxis: Subcutaneous heparin Code Status: Full code Family Communication: No family at bedside Disposition Plan: pending improvement in encephalopathy and neurology and palliative care consultations  Consultants:   Cardiology  Pulmonology  Procedures:   Echo as above  Antimicrobials:  Anti-infectives (From admission, onward)   Start     Dose/Rate Route Frequency Ordered Stop   11/04/17 2000  levofloxacin (LEVAQUIN) IVPB 750 mg  Status:  Discontinued     750 mg 100 mL/hr over 90 Minutes Intravenous Every 24 hours 10/26/2017  2041 11/06/17 0939   11/04/17 1000  ceFEPIme (MAXIPIME) 1 g in sodium chloride 0.9 % 100 mL IVPB     1 g 200 mL/hr over 30 Minutes Intravenous  Every 8 hours 11/04/17 0814 11/12/17 0243   11/04/17 0500  vancomycin (VANCOCIN) 1,250 mg in sodium chloride 0.9 % 250 mL IVPB  Status:  Discontinued     1,250 mg 166.7 mL/hr over 90 Minutes Intravenous Every 12 hours 11/10/2017 2041 11/06/17 0939   10/30/2017 2345  oseltamivir (TAMIFLU) capsule 75 mg     75 mg Oral 2 times daily 11/01/2017 2336 11/08/17 1135   11/16/2017 2000  oseltamivir (TAMIFLU) capsule 75 mg     75 mg Oral  Once 11/04/2017 1952 11/07/2017 2010   11/02/2017 1800  vancomycin (VANCOCIN) IVPB 1000 mg/200 mL premix     1,000 mg 200 mL/hr over 60 Minutes Intravenous  Once 10/30/2017 1755 11/05/2017 1957   10/30/2017 1800  levofloxacin (LEVAQUIN) IVPB 750 mg     750 mg 100 mL/hr over 90 Minutes Intravenous  Once 11/17/2017 1755 11/01/2017 1957       Subjective: Eyes open but does not follow any commands  Objective: Vitals:   11/15/17 1442 11/15/17 1500 11/15/17 1600 11/15/17 1700  BP:  132/89 120/88 119/90  Pulse:  (!) 126 (!) 130 (!) 136  Resp:  (!) 26 (!) 31 (!) 33  Temp:      TempSrc:      SpO2: 94% 95% 93% 93%  Weight:      Height:        Intake/Output Summary (Last 24 hours) at 11/15/2017 1811 Last data filed at 11/15/2017 0535 Gross per 24 hour  Intake 30 ml  Output 750 ml  Net -720 ml   Filed Weights   11/13/17 0400 11/14/17 0500 11/15/17 0530  Weight: 89.9 kg (198 lb 3.1 oz) 85 kg (187 lb 6.3 oz) 83.2 kg (183 lb 6.8 oz)    Examination:  General exam: Awake, nonverbal Respiratory system: Clear to auscultation. Respiratory effort normal. Cardiovascular system:RRR. No murmurs, rubs, gallops. Gastrointestinal system: Abdomen is nondistended, soft and nontender. No organomegaly or masses felt. Normal bowel sounds heard. Central nervous system: Awake, nonverbal  Extremities: No C/C/E, +pedal pulses Skin: No rashes, lesions or ulcers Psychiatry: Unable to assess given current mental state      Data Reviewed: I have personally reviewed following labs and imaging  studies  CBC: Recent Labs  Lab 11/09/17 0600 11/10/17 0533 11/12/17 0556 11/13/17 0541 11/15/17 0457  WBC 25.5* 24.8* 22.7* 23.5* 27.5*  NEUTROABS  --   --  19.5*  --   --   HGB 12.3 12.5 12.4 13.4 16.6*  HCT 37.4 39.5 38.5 41.7 51.2*  MCV 92.6 95.0 94.1 92.7 94.1  PLT 199 219 173 152 113*   Basic Metabolic Panel: Recent Labs  Lab 11/10/17 0533 11/11/17 0437 11/12/17 0556 11/13/17 0541 11/15/17 0457  NA 150* 146* 143 141 148*  K 3.8 3.9 3.8 3.6 4.4  CL 122* 120* 116* 110 111  CO2 16* 17* 17* 20* 22  GLUCOSE 250* 124* 65 98 172*  BUN 71* 64* 58* 61* 74*  CREATININE 0.83 0.79 0.71 0.66 0.84  CALCIUM 8.6* 8.5* 8.4* 8.6* 9.4   GFR: Estimated Creatinine Clearance: 82.7 mL/min (by C-G formula based on SCr of 0.84 mg/dL). Liver Function Tests: Recent Labs  Lab 11/10/17 0533 11/11/17 0437 11/12/17 0556 11/13/17 0541 11/15/17 0457  AST 53* 63* 61* 63* 95*  ALT 41 53  63* 75* 125*  ALKPHOS 102 107 100 107 228*  BILITOT 0.4 0.3 0.6 0.5 1.0  PROT 5.5* 5.6* 5.5* 6.2* 7.6  ALBUMIN 2.0* 2.1* 2.1* 2.2* 2.9*   No results for input(s): LIPASE, AMYLASE in the last 168 hours. Recent Labs  Lab 11/14/17 1008 11/15/17 0457  AMMONIA 20 53*   Coagulation Profile: No results for input(s): INR, PROTIME in the last 168 hours. Cardiac Enzymes: No results for input(s): CKTOTAL, CKMB, CKMBINDEX, TROPONINI in the last 168 hours. BNP (last 3 results) No results for input(s): PROBNP in the last 8760 hours. HbA1C: No results for input(s): HGBA1C in the last 72 hours. CBG: Recent Labs  Lab 11/15/17 0009 11/15/17 0352 11/15/17 0813 11/15/17 1136 11/15/17 1706  GLUCAP 213* 177* 196* 228* 337*   Lipid Profile: Recent Labs    11/13/17 0541  TRIG 286*   Thyroid Function Tests: No results for input(s): TSH, T4TOTAL, FREET4, T3FREE, THYROIDAB in the last 72 hours. Anemia Panel: No results for input(s): VITAMINB12, FOLATE, FERRITIN, TIBC, IRON, RETICCTPCT in the last 72  hours. Urine analysis:    Component Value Date/Time   COLORURINE YELLOW 11/11/2017 2130   APPEARANCEUR CLOUDY (A) 11/19/2017 2130   LABSPEC 1.022 11/01/2017 2130   PHURINE 6.0 11/19/2017 2130   GLUCOSEU >=500 (A) 11/09/2017 2130   HGBUR SMALL (A) 11/17/2017 2130   BILIRUBINUR NEGATIVE 11/02/2017 2130   BILIRUBINUR neg 03/23/2015 1735   KETONESUR 20 (A) 11/13/2017 2130   PROTEINUR 100 (A) 11/02/2017 2130   UROBILINOGEN 0.2 04/01/2015 0633   NITRITE NEGATIVE 11/12/2017 2130   LEUKOCYTESUR NEGATIVE 11/04/2017 2130   Sepsis Labs: @LABRCNTIP (procalcitonin:4,lacticidven:4)  ) No results found for this or any previous visit (from the past 240 hour(s)).       Radiology Studies: Dg Chest 1 View  Result Date: 11/15/2017 CLINICAL DATA:  Nasogastric tube placement. EXAM: CHEST 1 VIEW COMPARISON:  11/13/2017 FINDINGS: Nasal/orogastric tube passes below the diaphragm well into the stomach, below the included field of view. Endotracheal tube noted on the prior study is no longer visualized, presumably removed. There is opacity at the right lung base silhouetting the right hemidiaphragm, increased from the prior study. This is likely atelectasis. Pneumonia is possible. Remainder of the lungs is clear. IMPRESSION: 1. Well-positioned nasal/orogastric tube. 2. Increased opacity at the right lung base consistent with atelectasis, pneumonia or a combination. Electronically Signed   By: Amie Portland M.D.   On: 11/15/2017 13:13   Ct [name] Wo Contrast  Result Date: 11/14/2017 CLINICAL DATA:  Altered level of consciousness. EXAM: CT [name] WITHOUT CONTRAST TECHNIQUE: Contiguous axial images were obtained from the base of the skull through the vertex without intravenous contrast. COMPARISON:  None. FINDINGS: Brain: No evidence of acute infarction, hemorrhage, hydrocephalus, extra-axial collection or mass lesion/mass effect. Vascular: Negative for hyperdense vessel Skull: Negative Sinuses/Orbits: Retention  cyst left maxillary sinus. Remaining sinuses clear. Normal orbit Other: None IMPRESSION: Negative CT of the brain Electronically Signed   By: Marlan Palau M.D.   On: 11/14/2017 11:42        Scheduled Meds: . aspirin EC  81 mg Oral Daily  . atorvastatin  80 mg Oral q1800  . budesonide (PULMICORT) nebulizer solution  0.5 mg Nebulization BID  . carvedilol  6.25 mg Oral BID WC  . chlorhexidine  15 mL Mouth Rinse BID  . Chlorhexidine Gluconate Cloth  6 each Topical Daily  . clopidogrel  75 mg Oral Daily  . free water  200 mL Per Tube Q6H  .  heparin injection (subcutaneous)  5,000 Units Subcutaneous Q8H  . insulin aspart  0-20 Units Subcutaneous Q4H  . insulin aspart  16 Units Subcutaneous Q4H  . insulin glargine  10 Units Subcutaneous Daily  . ipratropium  0.5 mg Nebulization Q6H  . levalbuterol  0.63 mg Nebulization Q6H  . mouth rinse  15 mL Mouth Rinse q12n4p  . methylPREDNISolone (SOLU-MEDROL) injection  40 mg Intravenous Q12H  . pantoprazole sodium  40 mg Per Tube QHS  . potassium chloride  10 mEq Oral Daily  . sodium chloride flush  10-40 mL Intracatheter Q12H  . tapentadol  100 mg Oral Q12H   Continuous Infusions: . feeding supplement (JEVITY 1.2 CAL)    . propofol (DIPRIVAN) infusion Stopped (11/13/17 0730)     LOS: 12 days    Tme spent: 25 minutes.     Chaya Jan, MD [facility] Pager [phone]  If 7PM-7AM, please contact night-coverage [email or web address] Password [facility] 11/15/2017, 6:11 PM

## 2017-11-15 NOTE — Progress Notes (Signed)
Subjective: She is about the same.  She is still not talking.  She does follow with her eyes.  No other new problems noted.  Objective: Vital signs in last 24 hours: Temp:  [97.6 F (36.4 C)-99.7 F (37.6 C)] 98 F (36.7 C) (02/23 0824) Pulse Rate:  [96-117] 113 (02/23 0530) Resp:  [14-27] 24 (02/23 0530) BP: (100-136)/(80-98) 131/96 (02/23 0530) SpO2:  [91 %-96 %] 94 % (02/23 0824) Weight:  [83.2 kg (183 lb 6.8 oz)] 83.2 kg (183 lb 6.8 oz) (02/23 0530) Weight change: -1.8 kg (-15.5 oz) Last BM Date: 11/13/17  Intake/Output from previous day: 02/22 0701 - 02/23 0700 In: 40 [I.V.:40] Out: 2550 [Urine:2550]  PHYSICAL EXAM General appearance: She is awake and follows with her eyes but does not try to speak Resp: rhonchi bilaterally Cardio: regular rate and rhythm, S1, S2 normal, no murmur, click, rub or gallop GI: soft, non-tender; bowel sounds normal; no masses,  no organomegaly Extremities: extremities normal, atraumatic, no cyanosis or edema  Lab Results:  Results for orders placed or performed during the hospital encounter of 11/08/2017 (from the past 48 hour(s))  Glucose, capillary     Status: Abnormal   Collection Time: 11/13/17 11:20 AM  Result Value Ref Range   Glucose-Capillary 121 (H) 65 - 99 mg/dL  Glucose, capillary     Status: Abnormal   Collection Time: 11/13/17  4:24 PM  Result Value Ref Range   Glucose-Capillary 123 (H) 65 - 99 mg/dL  Glucose, capillary     Status: None   Collection Time: 11/13/17  7:42 PM  Result Value Ref Range   Glucose-Capillary 95 65 - 99 mg/dL   Comment 1 Notify RN   Glucose, capillary     Status: None   Collection Time: 11/13/17 11:51 PM  Result Value Ref Range   Glucose-Capillary 85 65 - 99 mg/dL   Comment 1 Notify RN   Glucose, capillary     Status: None   Collection Time: 11/14/17  3:49 AM  Result Value Ref Range   Glucose-Capillary 77 65 - 99 mg/dL   Comment 1 Notify RN   Glucose, capillary     Status: None   Collection  Time: 11/14/17  7:51 AM  Result Value Ref Range   Glucose-Capillary 97 65 - 99 mg/dL   Comment 1 Notify RN    Comment 2 Document in Chart   Ammonia     Status: None   Collection Time: 11/14/17 10:08 AM  Result Value Ref Range   Ammonia 20 9 - 35 umol/L    Comment: Performed at [facility], [address], [place], [place] [address]  Glucose, capillary     Status: Abnormal   Collection Time: 11/14/17 12:07 PM  Result Value Ref Range   Glucose-Capillary 118 (H) 65 - 99 mg/dL   Comment 1 Notify RN    Comment 2 Document in Chart   Glucose, capillary     Status: Abnormal   Collection Time: 11/14/17  4:11 PM  Result Value Ref Range   Glucose-Capillary 164 (H) 65 - 99 mg/dL   Comment 1 Notify RN    Comment 2 Document in Chart   Glucose, capillary     Status: Abnormal   Collection Time: 11/14/17  7:59 PM  Result Value Ref Range   Glucose-Capillary 191 (H) 65 - 99 mg/dL   Comment 1 Notify RN    Comment 2 Document in Chart   Glucose, capillary     Status: Abnormal  Collection Time: 11/15/17 12:09 AM  Result Value Ref Range   Glucose-Capillary 213 (H) 65 - 99 mg/dL   Comment 1 Notify RN    Comment 2 Document in Chart   Glucose, capillary     Status: Abnormal   Collection Time: 11/15/17  3:52 AM  Result Value Ref Range   Glucose-Capillary 177 (H) 65 - 99 mg/dL   Comment 1 Notify RN    Comment 2 Document in Chart   Comprehensive metabolic panel     Status: Abnormal   Collection Time: 11/15/17  4:57 AM  Result Value Ref Range   Sodium 148 (H) 135 - 145 mmol/L   Potassium 4.4 3.5 - 5.1 mmol/L    Comment: DELTA CHECK NOTED   Chloride 111 101 - 111 mmol/L   CO2 22 22 - 32 mmol/L   Glucose, Bld 172 (H) 65 - 99 mg/dL   BUN 74 (H) 6 - 20 mg/dL   Creatinine, Ser 0.84 0.44 - 1.00 mg/dL   Calcium 9.4 8.9 - 10.3 mg/dL   Total Protein 7.6 6.5 - 8.1 g/dL   Albumin 2.9 (L) 3.5 - 5.0 g/dL   AST 95 (H) 15 - 41 U/L   ALT 125 (H) 14 - 54 U/L   Alkaline Phosphatase 228 (H) 38 - 126 U/L    Total Bilirubin 1.0 0.3 - 1.2 mg/dL   GFR calc non Af Amer >60 >60 mL/min   GFR calc Af Amer >60 >60 mL/min    Comment: (NOTE) The eGFR has been calculated using the CKD EPI equation. This calculation has not been validated in all clinical situations. eGFR's persistently <60 mL/min signify possible Chronic Kidney Disease.    Anion gap 15 5 - 15    Comment: Performed at [facility], [address] [place], [place] [address]  CBC     Status: Abnormal   Collection Time: 11/15/17  4:57 AM  Result Value Ref Range   WBC 27.5 (H) 4.0 - 10.5 K/uL    Comment: WHITE COUNT CONFIRMED ON SMEAR   RBC 5.44 (H) 3.87 - 5.11 MIL/uL   Hemoglobin 16.6 (H) 12.0 - 15.0 g/dL   HCT 51.2 (H) 36.0 - 46.0 %   MCV 94.1 78.0 - 100.0 fL   MCH 30.5 26.0 - 34.0 pg   MCHC 32.4 30.0 - 36.0 g/dL   RDW 14.9 11.5 - 15.5 %   Platelets 113 (L) 150 - 400 K/uL    Comment: SPECIMEN CHECKED FOR CLOTS PLATELET COUNT CONFIRMED BY SMEAR Performed at [facility], [address] [place], [place] [address]   Ammonia     Status: Abnormal   Collection Time: 11/15/17  4:57 AM  Result Value Ref Range   Ammonia 53 (H) 9 - 35 umol/L    Comment: Performed at [facility], [address], [place], [place] [address]  Glucose, capillary     Status: Abnormal   Collection Time: 11/15/17  8:13 AM  Result Value Ref Range   Glucose-Capillary 196 (H) 65 - 99 mg/dL    ABGS Recent Labs    11/13/17 0500  PHART 7.365  PO2ART 90.0  HCO3 20.7   CULTURES No results found for this or any previous visit (from the past 240 hour(s)). Studies/Results: Ct Head Wo Contrast  Result Date: 11/14/2017 CLINICAL DATA:  Altered level of consciousness. EXAM: CT HEAD WITHOUT CONTRAST TECHNIQUE: Contiguous axial images were obtained from the base of the skull through the vertex without intravenous contrast. COMPARISON:  None. FINDINGS:  Brain: No evidence of acute infarction, hemorrhage, hydrocephalus, extra-axial collection or mass lesion/mass effect.  Vascular: Negative for hyperdense vessel Skull: Negative Sinuses/Orbits: Retention cyst left maxillary sinus. Remaining sinuses clear. Normal orbit Other: None IMPRESSION: Negative CT of the brain Electronically Signed   By: Franchot Gallo M.D.   On: 11/14/2017 11:42    Medications:  Prior to Admission:  Medications Prior to Admission  Medication Sig Dispense Refill Last Dose  . acetaminophen (TYLENOL) 500 MG tablet Take 500 mg by mouth every 6 (six) hours as needed for mild pain or moderate pain.   11/02/2017 at Unknown time  . aspirin EC 81 MG tablet Take 81 mg by mouth daily.   11/06/2017 at Unknown time  . carvedilol (COREG) 6.25 MG tablet Take 1 tablet (6.25 mg total) by mouth 2 (two) times daily. 180 tablet 3 10/27/2017 at Unknown time  . Cholecalciferol (VITAMIN D3) 5000 units CAPS Take 1 capsule (5,000 Units total) by mouth daily. 90 capsule 0 11/05/2017 at Unknown time  . clopidogrel (PLAVIX) 75 MG tablet Take 75 mg by mouth daily.   11/05/2017 at Unknown time  . diclofenac sodium (VOLTAREN) 1 % GEL Apply 4 g topically 4 (four) times daily. (Patient taking differently: Apply 2-4 g topically daily as needed (for leg pain). ) 200 g 11 Past Month at Unknown time  . dicyclomine (BENTYL) 20 MG tablet Take 1 tablet (20 mg total) by mouth every 8 (eight) hours as needed for spasms. 90 tablet 3 10/30/2017 at Unknown time  . diphenoxylate-atropine (LOMOTIL) 2.5-0.025 MG tablet Take 2 tabs by mouth x 1, then 1 tab every 4 hrs PRN, max of 8 tabs in 24 hour time 240 tablet 1 Past Week at Unknown time  . DULoxetine (CYMBALTA) 30 MG capsule Take 90 mg daily (60 mg + 30 mg) (Patient taking differently: Take 30 mg by mouth daily. ) 30 capsule 0 11/04/2017 at Unknown time  . DULoxetine (CYMBALTA) 60 MG capsule Take 90 mg daily (60 mg + 30 mg) 30 capsule 0 10/29/2017 at Unknown time  . furosemide (LASIX) 40 MG tablet TAKE (1) TABLET BY MOUTH EACH MORNING. 90 tablet 3 10/24/2017 at Unknown time  . ibuprofen  (ADVIL,MOTRIN) 600 MG tablet Take 600 mg by mouth 2 (two) times daily as needed for moderate pain.    11/11/2017 at Unknown time  . insulin aspart (NOVOLOG FLEXPEN) 100 UNIT/ML FlexPen Inject 15-21 Units into the skin 3 (three) times daily with meals. 5 pen 2 11/01/2017 at Unknown time  . INVOKANA 100 MG TABS tablet TAKE 1 TABLET BY MOUTH DAILY BEFORE BREAKFAST. 30 tablet 2 10/30/2017 at Unknown time  . LEVEMIR FLEXTOUCH 100 UNIT/ML Pen INJECT 80 UNITS UNDER THE SKIN EVERY DAY AT 10 PM 30 mL 2 11/02/2017 at Unknown time  . lisinopril (PRINIVIL,ZESTRIL) 5 MG tablet Take 5 mg by mouth daily.   11/17/2017 at Unknown time  . LORazepam (ATIVAN) 0.5 MG tablet Take 1 tablet (0.5 mg total) by mouth daily as needed for anxiety. 30 tablet 0 Past Week at Unknown time  . LYRICA 150 MG capsule take 126m by mouth three times daily  0 11/09/2017 at Unknown time  . meloxicam (MOBIC) 15 MG tablet Take 15 mg by mouth daily.   11/08/2017 at Unknown time  . metFORMIN (GLUCOPHAGE) 1000 MG tablet TAKE 1 TABLET(1000 MG) BY MOUTH TWICE DAILY WITH A MEAL 60 tablet 3 11/15/2017 at Unknown time  . nitroGLYCERIN (NITROSTAT) 0.4 MG SL tablet Place 1 tablet (  0.4 mg total) under the tongue every 5 (five) minutes as needed for chest pain. 30 tablet 0 unknown  . pantoprazole (PROTONIX) 40 MG tablet Take 1 tablet (40 mg total) by mouth 2 (two) times daily. 60 tablet 0 11/14/2017 at Unknown time  . potassium chloride (K-DUR) 10 MEQ tablet Take 1 tablet (10 mEq total) by mouth daily. 90 tablet 3 11/12/2017 at Unknown time  . pramipexole (MIRAPEX) 0.25 MG tablet Take 0.25 mg by mouth 2 (two) times daily.    11/02/2017 at Unknown time  . simvastatin (ZOCOR) 10 MG tablet TAKE (1) TABLET BY MOUTH DAILY. (Patient taking differently: TAKE (2) TABLET BY MOUTH DAILY.) 90 tablet 2 10/27/2017 at Unknown time  . tapentadol (NUCYNTA ER) 100 MG 12 hr tablet Take 100 mg by mouth every 12 (twelve) hours.   11/02/2017 at Unknown time  . tiZANidine (ZANAFLEX) 4 MG  capsule Take 4 mg by mouth 3 (three) times daily.   10/26/2017 at Unknown time  . albuterol (PROVENTIL) (2.5 MG/3ML) 0.083% nebulizer solution Take 3 mLs (2.5 mg total) by nebulization every 6 (six) hours as needed for wheezing or shortness of breath. 75 mL 12 unknown  . colestipol (COLESTID) 1 g tablet Take 1 tablet (1 g total) by mouth 2 (two) times daily. (Patient not taking: Reported on 11/07/2017) 90 tablet 3 unknown  . inFLIXimab 500 mg in sodium chloride 0.9 % 200 mL Inject 500 mg into the vein every 8 (eight) weeks. 1 Dose 6 Taking  . OXYGEN Inhale 3 L/day into the lungs at bedtime.   Taking   Scheduled: . aspirin EC  81 mg Oral Daily  . atorvastatin  80 mg Oral q1800  . budesonide (PULMICORT) nebulizer solution  0.5 mg Nebulization BID  . carvedilol  6.25 mg Oral BID WC  . chlorhexidine  15 mL Mouth Rinse BID  . Chlorhexidine Gluconate Cloth  6 each Topical Daily  . clopidogrel  75 mg Oral Daily  . feeding supplement (PRO-STAT SUGAR FREE 64)  60 mL Per Tube TID  . heparin injection (subcutaneous)  5,000 Units Subcutaneous Q8H  . insulin aspart  0-20 Units Subcutaneous Q4H  . insulin aspart  16 Units Subcutaneous Q4H  . insulin glargine  10 Units Subcutaneous Daily  . ipratropium  0.5 mg Nebulization Q6H  . levalbuterol  0.63 mg Nebulization Q6H  . mouth rinse  15 mL Mouth Rinse q12n4p  . methylPREDNISolone (SOLU-MEDROL) injection  40 mg Intravenous Q8H  . pantoprazole sodium  40 mg Per Tube QHS  . potassium chloride  10 mEq Oral Daily  . sodium chloride flush  10-40 mL Intracatheter Q12H  . tapentadol  100 mg Oral Q12H   Continuous: . propofol (DIPRIVAN) infusion Stopped (11/13/17 0730)   BJY:[id number] **OR** acetaminophen, levalbuterol, LORazepam, morphine injection, sodium chloride flush  Assesment: She was admitted with influenza A and COPD.  She had acute hypoxic respiratory failure requiring intubation and mechanical ventilation.  She had diffuse bilateral pneumonia  associated with her influenza.  She has been able to be extubated but remains encephalopathic possibly from hypoxia  She remains hypoxic and is on high flow nasal cannula  She had acute on chronic heart failure and she has diuresed well and seems to be doing better.  She had a non-STEMI and is doing okay with no chest pain reported but with her encephalopathy is difficult to be sure Active Problems:   DM type 2 causing vascular disease ([place])   Current smoker  ESOPHAGEAL STRICTURE   Esophageal reflux   Gastroparesis   Cardiac pacemaker in situ   COPD GOLD 0 / still smoking    OSA (obstructive sleep apnea)   Diabetic neuropathy (HCC)   Dysphagia   Crohn's disease without complication (HCC)   COPD exacerbation (HCC)   Influenza A   Acute respiratory failure with hypoxia (HCC)   Hyponatremia   Hypokalemia    Plan: Continue treatments.  Still high risk of needing reintubation due to her encephalopathy    LOS: 12 days   Loucile Posner L 11/15/2017, 9:20 AM

## 2017-11-15 NOTE — Progress Notes (Signed)
Initial Nutrition Assessment  DOCUMENTATION CODES:  Obesity unspecified  INTERVENTION:  Initiate TF via NGT with Jevity 1.2 at goal rate of 60 ml/h (1440 ml per day) to provide 1728 kcals, 80 gm protein, 1162 ml free water daily.  Add 200 cc free water flush q6 hrs.   NUTRITION DIAGNOSIS:  Inadequate oral intake related to inability to eat as evidenced by NPO status.  GOAL:  Provide needs based on ASPEN/SCCM guidelines  MONITOR:  Diet advancement, Vent status, Labs, Weight trends, TF tolerance  REASON FOR ASSESSMENT:  Consult Enteral/tube feeding initiation and management  ASSESSMENT:  52 y/o female PMHx Crohns, GERD, Depression/Anxiety, COPD on Home o2, CVA, DM2, PTSD, CHF, OSA, HTN. Presented with abd pain, cough, congestion, emesis, diarrhea for several days. Had been off 02 d/t insurance issues. Found to be Influenza A positive. D/t worsening respiratory failure, intubated 2/12  Interval Hx: VDRF thought r/t PNA, influ A and COPD exacerbation. Pt sufered Nstemi. Nutritionally, Patient maintained on low dose tube feeding (was on high dose propofol) from 2/14-2/21. POst extubation, patient remains encephalopathic. NGT placed 2/23.   RD operating remotely. RD consulted for TF and was informed by ST that NGT had been placed.   Patient has lost a significant amount of weight since admitted. Was admitted at ~200 lbs. Now at 183.4 lbs. Much of loss felt related to fluid, has been undergoing diureses periodically since admit.   Readjusted estimated needs and placed TF orders. She had a large increase in sodium. Will add water flushes.   Will follow up next week to monitor TF tolerance or assist with PO intake if diet had been advanced by that time.   She had presented w/ n/v/d. Reasonable to conclude she hadnt been eating as well, however, unable to confirm. She does appears to have had acute weight loss.  She was 212 at outpatient infusion 2 weeks PTA, presented at about 200 lbs.     Per chart, her UBW for the past couple years appears to be 205-215 lbs. Has had acute periods outside this range, She has HF and on diuretics.   Physical Exam: Unable to assess  Labs: BGs: 160-230, Albumin: 2.9, WBC: 27.5 , TG: 286, elevated LFTs, Na: 141-> 148,  BUN/Creat: Improved. Creat now WDL  Meds: Methylprednisolone, Prostat, Insulin, PPI, KCL,   Recent Labs  Lab 11/12/17 0556 11/13/17 0541 11/15/17 0457  NA 143 141 148*  K 3.8 3.6 4.4  CL 116* 110 111  CO2 17* 20* 22  BUN 58* 61* 74*  CREATININE 0.71 0.66 0.84  CALCIUM 8.4* 8.6* 9.4  GLUCOSE 65 98 172*   NUTRITION - FOCUSED PHYSICAL EXAM: WDL  Diet Order:  Diet NPO time specified  EDUCATION NEEDS:  No education needs have been identified at this time  Skin: Serous Blister to sacrum, DTI to R Heel  Last BM:  2/21  Height:  Ht Readings from Last 1 Encounters:  11/10/17 5\' 4"  (1.626 m)   Weight:  Wt Readings from Last 1 Encounters:  11/15/17 183 lb 6.8 oz (83.2 kg)   Wt Readings from Last 10 Encounters:  11/15/17 183 lb 6.8 oz (83.2 kg)  10/21/17 212 lb (96.2 kg)  10/09/17 212 lb (96.2 kg)  09/20/17 197 lb (89.4 kg)  08/26/17 210 lb (95.3 kg)  07/08/17 207 lb (93.9 kg)  06/25/17 214 lb (97.1 kg)  05/23/17 215 lb (97.5 kg)  05/05/17 215 lb 6.4 oz (97.7 kg)  04/30/17 215 lb (97.5 kg)  Ideal Body Weight:  54.54 kg  BMI:  Body mass index is 31.48 kg/m.  Estimated Nutritional Needs:  Kcal:  1650-1850 (20-22 kcal/kg bw) Protein:  76-87G pRO (1.4-1.6 g/kg ibw) Fluid:  1.7-1.9 L fluid  Christophe Louis RD, LDN, CNSC Clinical Nutrition Pager: [phone] 11/15/2017 3:43 PM

## 2017-11-16 ENCOUNTER — Inpatient Hospital Stay (HOSPITAL_COMMUNITY): Admission: EM | Disposition: E | Payer: Self-pay | Source: Home / Self Care | Attending: Family Medicine

## 2017-11-16 ENCOUNTER — Inpatient Hospital Stay (HOSPITAL_COMMUNITY): Payer: Medicaid Other

## 2017-11-16 DIAGNOSIS — R9431 Abnormal electrocardiogram [ECG] [EKG]: Secondary | ICD-10-CM

## 2017-11-16 DIAGNOSIS — A419 Sepsis, unspecified organism: Secondary | ICD-10-CM | POA: Diagnosis present

## 2017-11-16 DIAGNOSIS — R0603 Acute respiratory distress: Secondary | ICD-10-CM

## 2017-11-16 DIAGNOSIS — L8990 Pressure ulcer of unspecified site, unspecified stage: Secondary | ICD-10-CM

## 2017-11-16 DIAGNOSIS — I2510 Atherosclerotic heart disease of native coronary artery without angina pectoris: Secondary | ICD-10-CM

## 2017-11-16 DIAGNOSIS — I5181 Takotsubo syndrome: Secondary | ICD-10-CM

## 2017-11-16 HISTORY — PX: RIGHT/LEFT HEART CATH AND CORONARY ANGIOGRAPHY: CATH118266

## 2017-11-16 LAB — CBC WITH DIFFERENTIAL/PLATELET
BASOS ABS: 0 10*3/uL (ref 0.0–0.1)
Basophils Relative: 0 %
Eosinophils Absolute: 0 10*3/uL (ref 0.0–0.7)
Eosinophils Relative: 0 %
HEMATOCRIT: 47.1 % — AB (ref 36.0–46.0)
HEMOGLOBIN: 15.2 g/dL — AB (ref 12.0–15.0)
LYMPHS PCT: 11 %
Lymphs Abs: 2.9 10*3/uL (ref 0.7–4.0)
MCH: 31.1 pg (ref 26.0–34.0)
MCHC: 32.3 g/dL (ref 30.0–36.0)
MCV: 96.3 fL (ref 78.0–100.0)
MONOS PCT: 9 %
Monocytes Absolute: 2.3 10*3/uL — ABNORMAL HIGH (ref 0.1–1.0)
NEUTROS PCT: 80 %
Neutro Abs: 20.8 10*3/uL — ABNORMAL HIGH (ref 1.7–7.7)
Platelets: 67 10*3/uL — ABNORMAL LOW (ref 150–400)
RBC: 4.89 MIL/uL (ref 3.87–5.11)
RDW: 15.5 % (ref 11.5–15.5)
WBC: 26 10*3/uL — AB (ref 4.0–10.5)

## 2017-11-16 LAB — PROTIME-INR
INR: 1.38
PROTHROMBIN TIME: 16.8 s — AB (ref 11.4–15.2)

## 2017-11-16 LAB — COMPREHENSIVE METABOLIC PANEL
ALK PHOS: 449 U/L — AB (ref 38–126)
ALT: 192 U/L — AB (ref 14–54)
AST: 110 U/L — ABNORMAL HIGH (ref 15–41)
Albumin: 2.3 g/dL — ABNORMAL LOW (ref 3.5–5.0)
Anion gap: 9 (ref 5–15)
BILIRUBIN TOTAL: 0.9 mg/dL (ref 0.3–1.2)
BUN: 92 mg/dL — ABNORMAL HIGH (ref 6–20)
CALCIUM: 8.2 mg/dL — AB (ref 8.9–10.3)
CO2: 21 mmol/L — AB (ref 22–32)
CREATININE: 1.15 mg/dL — AB (ref 0.44–1.00)
Chloride: 125 mmol/L — ABNORMAL HIGH (ref 101–111)
GFR calc non Af Amer: 54 mL/min — ABNORMAL LOW (ref >60)
GLUCOSE: 139 mg/dL — AB (ref 65–99)
Potassium: 4 mmol/L (ref 3.5–5.1)
SODIUM: 155 mmol/L — AB (ref 135–145)
TOTAL PROTEIN: 5.7 g/dL — AB (ref 6.5–8.1)

## 2017-11-16 LAB — POCT I-STAT 3, ART BLOOD GAS (G3+)
ACID-BASE DEFICIT: 7 mmol/L — AB (ref 0.0–2.0)
BICARBONATE: 18.1 mmol/L — AB (ref 20.0–28.0)
O2 Saturation: 94 %
TCO2: 19 mmol/L — AB (ref 22–32)
pCO2 arterial: 37.9 mmHg (ref 32.0–48.0)
pH, Arterial: 7.298 — ABNORMAL LOW (ref 7.350–7.450)
pO2, Arterial: 86 mmHg (ref 83.0–108.0)

## 2017-11-16 LAB — TROPONIN I: Troponin I: 0.81 ng/mL (ref <0.03)

## 2017-11-16 LAB — GLUCOSE, CAPILLARY
Glucose-Capillary: 309 mg/dL — ABNORMAL HIGH (ref 65–99)
Glucose-Capillary: 291 mg/dL — ABNORMAL HIGH (ref 65–99)
Glucose-Capillary: 192 mg/dL — ABNORMAL HIGH (ref 65–99)
Glucose-Capillary: 160 mg/dL — ABNORMAL HIGH (ref 65–99)
Glucose-Capillary: 172 mg/dL — ABNORMAL HIGH (ref 65–99)

## 2017-11-16 LAB — COOXEMETRY PANEL
CARBOXYHEMOGLOBIN: 0.4 % — AB (ref 0.5–1.5)
Methemoglobin: 1.7 % — ABNORMAL HIGH (ref 0.0–1.5)
O2 Saturation: 69.9 %
Total hemoglobin: 15.2 g/dL (ref 12.0–16.0)

## 2017-11-16 LAB — CORTISOL: Cortisol, Plasma: 22.2 ug/dL

## 2017-11-16 LAB — LACTIC ACID, PLASMA
Lactic Acid, Venous: 1.1 mmol/L (ref 0.5–1.9)
Lactic Acid, Venous: 1.1 mmol/L (ref 0.5–1.9)

## 2017-11-16 LAB — POCT ACTIVATED CLOTTING TIME: ACTIVATED CLOTTING TIME: 136 s

## 2017-11-16 LAB — PROCALCITONIN: PROCALCITONIN: 1.95 ng/mL

## 2017-11-16 LAB — TSH: TSH: 0.196 u[IU]/mL — AB (ref 0.350–4.500)

## 2017-11-16 LAB — MRSA PCR SCREENING: MRSA by PCR: NEGATIVE

## 2017-11-16 LAB — AMMONIA: Ammonia: 59 umol/L — ABNORMAL HIGH (ref 9–35)

## 2017-11-16 SURGERY — RIGHT/LEFT HEART CATH AND CORONARY ANGIOGRAPHY
Anesthesia: LOCAL

## 2017-11-16 MED ORDER — HEPARIN SODIUM (PORCINE) 5000 UNIT/ML IJ SOLN
5000.0000 [IU] | Freq: Three times a day (TID) | INTRAMUSCULAR | Status: DC
  Administered 2017-11-16 – 2017-11-18 (×5): 5000 [IU] via SUBCUTANEOUS
  Filled 2017-11-16 (×5): qty 1

## 2017-11-16 MED ORDER — ATROPINE SULFATE 1 MG/10ML IJ SOSY
PREFILLED_SYRINGE | INTRAMUSCULAR | Status: AC
  Filled 2017-11-16: qty 10

## 2017-11-16 MED ORDER — LACTATED RINGERS IV SOLN
INTRAVENOUS | Status: DC
  Administered 2017-11-16: 22:00:00 via INTRAVENOUS
  Administered 2017-11-16: 100 mL via INTRAVENOUS

## 2017-11-16 MED ORDER — SODIUM CHLORIDE 0.9 % IV SOLN
INTRAVENOUS | Status: AC | PRN
  Administered 2017-11-16: 250 mL via INTRAVENOUS
  Administered 2017-11-16: 300 mL via INTRAVENOUS
  Administered 2017-11-16: 250 mL via INTRAVENOUS

## 2017-11-16 MED ORDER — IOPAMIDOL (ISOVUE-370) INJECTION 76%
INTRAVENOUS | Status: AC
  Filled 2017-11-16: qty 150

## 2017-11-16 MED ORDER — SODIUM CHLORIDE 0.9 % IV SOLN: INTRAVENOUS | Status: AC

## 2017-11-16 MED ORDER — HEPARIN SODIUM (PORCINE) 5000 UNIT/ML IJ SOLN: 5000.0000 [IU] | Freq: Three times a day (TID) | INTRAMUSCULAR | Status: DC

## 2017-11-16 MED ORDER — SODIUM CHLORIDE 0.9 % IV BOLUS (SEPSIS)
500.0000 mL | Freq: Once | INTRAVENOUS | Status: AC
  Administered 2017-11-16: 500 mL via INTRAVENOUS

## 2017-11-16 MED ORDER — ASPIRIN 300 MG RE SUPP
300.0000 mg | Freq: Once | RECTAL | Status: AC
  Administered 2017-11-16: 300 mg via RECTAL
  Filled 2017-11-16: qty 1

## 2017-11-16 MED ORDER — IOPAMIDOL (ISOVUE-370) INJECTION 76%
INTRAVENOUS | Status: DC | PRN
  Administered 2017-11-16: 80 mL via INTRA_ARTERIAL

## 2017-11-16 MED ORDER — VANCOMYCIN HCL IN DEXTROSE 750-5 MG/150ML-% IV SOLN
750.0000 mg | Freq: Two times a day (BID) | INTRAVENOUS | Status: DC
  Administered 2017-11-16 – 2017-11-17 (×4): 750 mg via INTRAVENOUS
  Filled 2017-11-16 (×5): qty 150

## 2017-11-16 MED ORDER — HEPARIN (PORCINE) IN NACL 100-0.45 UNIT/ML-% IJ SOLN
900.0000 [IU]/h | INTRAMUSCULAR | Status: DC
  Administered 2017-11-16 (×2): 900 [IU]/h via INTRAVENOUS
  Filled 2017-11-16: qty 250

## 2017-11-16 MED ORDER — VERAPAMIL HCL 2.5 MG/ML IV SOLN
INTRAVENOUS | Status: AC
  Filled 2017-11-16: qty 2

## 2017-11-16 MED ORDER — SODIUM CHLORIDE 0.9 % IV SOLN
250.0000 mL | INTRAVENOUS | Status: DC | PRN
  Administered 2017-11-17 – 2017-11-24 (×2): 250 mL via INTRAVENOUS

## 2017-11-16 MED ORDER — HEPARIN (PORCINE) IN NACL 100-0.45 UNIT/ML-% IJ SOLN
INTRAMUSCULAR | Status: AC
  Filled 2017-11-16: qty 250

## 2017-11-16 MED ORDER — SUCCINYLCHOLINE CHLORIDE 20 MG/ML IJ SOLN
INTRAMUSCULAR | Status: AC | PRN
  Administered 2017-11-16: 150 mg via INTRAVENOUS

## 2017-11-16 MED ORDER — HEPARIN BOLUS VIA INFUSION
3000.0000 [IU] | Freq: Once | INTRAVENOUS | Status: AC
  Administered 2017-11-16: 3000 [IU] via INTRAVENOUS
  Filled 2017-11-16: qty 3000

## 2017-11-16 MED ORDER — ETOMIDATE 2 MG/ML IV SOLN
INTRAVENOUS | Status: AC | PRN
  Administered 2017-11-16: 20 mg via INTRAVENOUS

## 2017-11-16 MED ORDER — SODIUM CHLORIDE 0.9 % IV SOLN: INTRAVENOUS | Status: DC | PRN

## 2017-11-16 MED ORDER — SODIUM CHLORIDE 0.9 % IV SOLN: 250.0000 mL | INTRAVENOUS | Status: DC | PRN

## 2017-11-16 MED ORDER — CHLORHEXIDINE GLUCONATE 0.12% ORAL RINSE (MEDLINE KIT)
15.0000 mL | Freq: Two times a day (BID) | OROMUCOSAL | Status: DC
  Administered 2017-11-16 – 2017-11-17 (×2): 15 mL via OROMUCOSAL

## 2017-11-16 MED ORDER — LIDOCAINE HCL (PF) 1 % IJ SOLN
INTRAMUSCULAR | Status: DC | PRN
  Administered 2017-11-16: 16 mL

## 2017-11-16 MED ORDER — SODIUM CHLORIDE 0.9% FLUSH
3.0000 mL | Freq: Two times a day (BID) | INTRAVENOUS | Status: DC
  Administered 2017-11-18 – 2017-11-24 (×8): 3 mL via INTRAVENOUS

## 2017-11-16 MED ORDER — HEPARIN (PORCINE) IN NACL 2-0.9 UNIT/ML-% IJ SOLN
INTRAMUSCULAR | Status: AC
  Filled 2017-11-16: qty 1500

## 2017-11-16 MED ORDER — FENTANYL 2500MCG IN NS 250ML (10MCG/ML) PREMIX INFUSION
0.0000 ug/h | INTRAVENOUS | Status: DC
  Administered 2017-11-16: 50 ug/h via INTRAVENOUS
  Filled 2017-11-16: qty 250

## 2017-11-16 MED ORDER — SODIUM CHLORIDE 0.9% FLUSH: 3.0000 mL | INTRAVENOUS | Status: DC | PRN

## 2017-11-16 MED ORDER — ORAL CARE MOUTH RINSE
15.0000 mL | Freq: Four times a day (QID) | OROMUCOSAL | Status: DC
  Administered 2017-11-17 (×2): 15 mL via OROMUCOSAL

## 2017-11-16 MED ORDER — LIDOCAINE HCL (PF) 1 % IJ SOLN
INTRAMUSCULAR | Status: AC
  Filled 2017-11-16: qty 30

## 2017-11-16 MED ORDER — HEPARIN (PORCINE) IN NACL 2-0.9 UNIT/ML-% IJ SOLN
INTRAMUSCULAR | Status: AC | PRN
  Administered 2017-11-16 (×3): 500 mL

## 2017-11-16 MED ORDER — SODIUM CHLORIDE 0.9 % IV SOLN
1.0000 g | Freq: Three times a day (TID) | INTRAVENOUS | Status: DC
  Administered 2017-11-16 – 2017-11-19 (×9): 1 g via INTRAVENOUS
  Filled 2017-11-16 (×10): qty 1

## 2017-11-16 SURGICAL SUPPLY — 15 items
CATH IMPULSE 5F ANG/FL3.5 (CATHETERS) ×2 IMPLANT
CATH INFINITI 5FR JL4 (CATHETERS) ×2 IMPLANT
CATH SWAN GANZ 7F STRAIGHT (CATHETERS) ×2 IMPLANT
GLIDESHEATH SLEND SS 6F .021 (SHEATH) ×2 IMPLANT
GUIDEWIRE INQWIRE 1.5J.035X260 (WIRE) ×1 IMPLANT
INQWIRE 1.5J .035X260CM (WIRE) ×2
KIT ENCORE 26 ADVANTAGE (KITS) ×4 IMPLANT
KIT HEART LEFT (KITS) ×2 IMPLANT
PACK CARDIAC CATHETERIZATION (CUSTOM PROCEDURE TRAY) ×2 IMPLANT
SHEATH PINNACLE 6F 10CM (SHEATH) ×2 IMPLANT
SHEATH PINNACLE 7F 10CM (SHEATH) ×2 IMPLANT
SLEEVE REPOSITIONING LENGTH 30 (MISCELLANEOUS) IMPLANT
TRANSDUCER W/STOPCOCK (MISCELLANEOUS) ×2 IMPLANT
TUBING CIL FLEX 10 FLL-RA (TUBING) ×2 IMPLANT
WIRE EMERALD 3MM-J .035X150CM (WIRE) ×2 IMPLANT

## 2017-11-16 NOTE — Progress Notes (Signed)
Pt intubated in [facility] ED and transported to cath lab.

## 2017-11-16 NOTE — Progress Notes (Signed)
In care of [facility] crew and en Route from [facility] ICU at [phone]

## 2017-11-16 NOTE — ED Provider Notes (Signed)
Patient is a 52 year old female transferred from [facility] as a code STEMI.  She was apparently admitted to their ICU where she became increasingly ill.  This evening she was found to have EKG changes consistent with an acute MI and was transferred here for cardiac cath.  She was on BiPAP for transport, however became too unstable to go to the Cath Lab.  I was requested by Dr. [place] to perform intubation is prior to transport.  RSI was performed using 20 mg of etomidate and 150 mg of succinylcholine.  The cords were easily visualized with the glide scope and a 7.5 endotracheal tube was easily placed.  Tube placement was confirmed with direct visualization of the tube passing the cords, end-tidal CO2, and direct auscultation over the chest and abdomen.  She will go to the Cath Lab as planned for intervention.  INTUBATION Performed by: Geoffery Lyons  Required items: required blood products, implants, devices, and special equipment available Patient identity confirmed: provided demographic data and hospital-assigned identification number Time out: Immediately prior to procedure a "time out" was called to verify the correct patient, procedure, equipment, support staff and site/side marked as required.  Indications: Respiratory failure  Intubation method: Glidescope Laryngoscopy   Preoxygenation: BVM, Bipap  Sedatives: 20 mg Etomidate Paralytic: 150 mg Succinylcholine  Tube Size: 7.5  cuffed  Post-procedure assessment: chest rise and ETCO2 monitor Breath sounds: equal and absent over the epigastrium Tube secured with: ETT holder Chest x-ray interpreted by radiologist and me.  Chest x-ray findings: endotracheal tube in appropriate position  Patient tolerated the procedure well with no immediate complications.      Geoffery Lyons, MD 11/10/2017 [id number]

## 2017-11-16 NOTE — Progress Notes (Addendum)
[name] was admitted on 2/11 with AoC hypoxic respiratory failure with sepsis d/t pneumonia and flu A, was in DKA, and developed NSTEMI on 2/12-2/13. She was extubated on 2/21.   She has become tachycardic this morning with increased respiratory rate. She has not been responding verbally. ABG with hypoxia. CVP is 3. Marked ST abnormality noted on monitor and EKG obtained.   Check troponin, give aspirin, already started on BiPAP, given 500 cc NS in light of tachycardia with low CVP. [facility] is paging cardiology regarding EKG changes. Troponin pending.   Appreciate cardiology input, will transfer to [facility] for cardiac eval and tx.

## 2017-11-16 NOTE — Progress Notes (Signed)
RT Note: Patient was transported to and from CT on the ventilator with no complications. Patient tolerated the transport well with no complications. Rt will continue to monitor.

## 2017-11-16 NOTE — Progress Notes (Signed)
ANTICOAGULATION CONSULT NOTE - Preliminary  Pharmacy Consult for Heparin Indication: ACS/STEMI  Allergies  Allergen Reactions  . Flexeril [Cyclobenzaprine] Hives  . Amoxicillin Hives and Rash    Has patient had a PCN reaction causing immediate rash, facial/tongue/throat swelling, SOB or lightheadedness with hypotension: Yes Has patient had a PCN reaction causing severe rash involving mucus membranes or skin necrosis: Yes Has patient had a PCN reaction that required hospitalization No Has patient had a PCN reaction occurring within the last 10 years: Yes If all of the above answers are "NO", then may proceed with Cephalosporin use.     Patient Measurements: Height: 5\' 4"  (162.6 cm) Weight: 183 lb 6.8 oz (83.2 kg) IBW/kg (Calculated) : 54.7 HEPARIN DW (KG): 75.9   Vital Signs: Temp: 97.5 F (36.4 C) (02/24 0000) Temp Source: Axillary (02/24 0000) BP: 83/53 (02/24 0500) Pulse Rate: 119 (02/24 0500)  Labs: Recent Labs    11/13/17 0541 11/15/17 0457  HGB 13.4 16.6*  HCT 41.7 51.2*  PLT 152 113*  CREATININE 0.66 0.84   Estimated Creatinine Clearance: 82.7 mL/min (by C-G formula based on SCr of 0.84 mg/dL).  Medical History: Past Medical History:  Diagnosis Date  . Anxiety   . Arthritis   . Cardiac pacemaker in situ 2009   DDD [employer] -- ALTRUNA 60  . COPD with asthma (HCC)    GOLD 2-3 --  pulmologist (last visit 2011) Dr. Marchelle Gearing  . Crohn's disease (HCC)    Large intestine  . Depression 2016   PTSD  . Essential hypertension   . Family history of adverse reaction to anesthesia    father- stop breathing surgery   . Gastroparesis   . GERD (gastroesophageal reflux disease)   . History of hiatal hernia   . History of kidney stones   . History of stroke    Jun 2011 -- right hand weakness  . History of syncope   . Inappropriate sinus tachycardia    Sinus node modification 02-25-2003 by Dr. Lewayne Bunting  . OSA (obstructive sleep apnea)    Study done  2005 -- pt refused CPAP/previously was using nocturnal oxygen until one year ago pt states PCP is monitoring pt without  . Pelvic pain in female   . PTSD (post-traumatic stress disorder)   . Sinus node dysfunction (HCC)    Symptomatic bradycardia  . Type 2 diabetes mellitus (HCC)   . Wears glasses     Medications:  Heparin 5000 units SubQ 2/15>>2/24 IV heparin 2/13>>2/15  Assessment: 52 yo female admitted 2/11 with hypoxic respiratory failure and sepsis; developed NSTEMI 2/12-2/13. Pt now tachycardic w/ ST abnormality. Pharmacy has been consulted for IV heparin dosing.  Goal of Therapy:  Heparin level goal 0.3-0.7 unit/ml Monitor platelets by anticoagulation protocol; Yes   Plan:  Heparin 3000 units IV x one Heparin infusion at 900 units/hr Heparin level in 6-8 hrs, then daily CBC daily while on heparin  Preliminary review of pertinent patient information completed.  Jeani Hawking clinical pharmacist will complete review during morning rounds to assess the patient and finalize treatment regimen.  Arelia Sneddon, [facility] 22-Nov-2017,5:18 AM

## 2017-11-16 NOTE — Progress Notes (Signed)
RT Note: Patient has been transported to [facility] from the cath lab on the ventilator with no issues. Rt will continue to monitor and assist as needed.

## 2017-11-16 NOTE — Progress Notes (Signed)
Dr Dossie Arbour was paged and came to ICU due to status change in patients respiratory rate/Heart rate, pupils and BP. Dr Antionette Char spoke with [facility] and transport was initiated. Orders completed and report given to [facility] crew.RT in room to assist crew. Heparin infusing per order and all other orders completed.  Paperwork with crew.

## 2017-11-16 NOTE — Procedures (Signed)
Arterial line placement  An arterial line was placed for continuous BP monitoring and frequent ABG's Informed consent was obtained and time out performed. Sterile prep with chlorhexidine, drape applied, and the left femoral artery cannulated sterilely by palpation. A   Wire was gently placed and a 20 ga arterial catheter placed over the wire. There was a good tracing and flow, and no immediate complication.

## 2017-11-16 NOTE — Code Documentation (Signed)
STEMI transfer from [facility] ICU, brought to trauma A to intubate.Pt unresponsive on the bipap at this time, cath lab MD at bedside

## 2017-11-16 NOTE — H&P (Signed)
PULMONARY / CRITICAL CARE MEDICINE   Name: CARALYNN GELBER MRN: [id number] DOB: 11-May-1966    ADMISSION DATE:  11/01/2017  CHIEF COMPLAINT:  Respiratory failure  HISTORY OF PRESENT ILLNESS:       This is a chronically ill 52 year old who presented to [facility] on 2/11 complaining of dyspnea nausea vomiting and diarrhea.  She was found to be influenza positive and required intubation and mechanical ventilation.  She was intubated until 2/21 and when she was extubated she was encephalopathic.  In addition she had an elevation of her cardiac enzymes and an echocardiogram was obtained showing an ejection fraction of 30% felt to be consistent with a Takotsubo syndrome.  She subsequently developed diffuse ST segment elevation with most pronounced elevation inferiorly were Q waves are present.  She was transferred to this hospital and on arrival required intubation for respiratory distress.  She was taken to the cardiac catheterization laboratory where she was found to have diffuse disease not requiring intervention.  Of note her LVEDP was normal at 11.  She was transferred to the intensive care unit where she is unresponsive despite receiving no sedative agents. She has a complex past medical history and it appears that she receives an anti-TNF agent for Crohn's disease.  She also appears to have chronic lung disease for which she is O2 dependent at home and she has a history of pacer placement.  A deep purulent sacral ulcer is reported.  PAST MEDICAL HISTORY :  She  has a past medical history of Anxiety, Arthritis, Cardiac pacemaker in situ (2009), COPD with asthma (HCC), Crohn's disease (HCC), Depression (2016), Essential hypertension, Family history of adverse reaction to anesthesia, Gastroparesis, GERD (gastroesophageal reflux disease), History of hiatal hernia, History of kidney stones, History of stroke, History of syncope, Inappropriate sinus tachycardia, OSA (obstructive sleep apnea), Pelvic  pain in female, PTSD (post-traumatic stress disorder), Sinus node dysfunction (HCC), Type 2 diabetes mellitus (HCC), and Wears glasses.  PAST SURGICAL HISTORY: She  has a past surgical history that includes Cesarean section (x2); Tonsillectomy (11-25-2002); Cardiac catheterization (05-08-2001  dr Nicki Guadalajara); CARDIAC ELECTROPHYSIOLOGY STUDY W/  SINUS NODE MODIFICATION (02-25-2003  dr gregg taylor); Cholecystectomy (1994); Cardiac pacemaker placement (10-16-2007  dr gregg taylor); CYSTO/  URETEROSCOPIC STONE EXTRACTION (03/ 2005); BILATERAL KNEE ARTHROSCOPY W/ CHONDROMALACIA PATELLA (bilateral ---- 12-26-2010; 08-14-2009;  04-25-2008;  03-09-2007); Cardiovascular stress test (08-02-2010  dr Diona Browner); Cystoscopy with hydrodistension and biopsy (N/A, 05/23/2015); Abdominal hysterectomy; Colonoscopy with propofol (N/A, 09/27/2016); Esophagogastroduodenoscopy (egd) with propofol (N/A, 09/27/2016); Esophageal manometry (N/A, 12/30/2016); 24 hour ph study (N/A, 12/30/2016); Multiple extractions with alveoloplasty; Colonoscopy (N/A, 05/23/2017); Esophagogastroduodenoscopy (egd) with propofol (N/A, 05/23/2017); and Savory dilation (N/A, 05/23/2017).  Allergies  Allergen Reactions  . Flexeril [Cyclobenzaprine] Hives  . Amoxicillin Hives and Rash    Has patient had a PCN reaction causing immediate rash, facial/tongue/throat swelling, SOB or lightheadedness with hypotension: Yes Has patient had a PCN reaction causing severe rash involving mucus membranes or skin necrosis: Yes Has patient had a PCN reaction that required hospitalization No Has patient had a PCN reaction occurring within the last 10 years: Yes If all of the above answers are "NO", then may proceed with Cephalosporin use.     No current facility-administered medications on file prior to encounter.    Current Outpatient Medications on File Prior to Encounter  Medication Sig  . acetaminophen (TYLENOL) 500 MG tablet Take 500 mg by mouth every 6 (six)  hours as needed for mild pain or moderate  pain.  . aspirin EC 81 MG tablet Take 81 mg by mouth daily.  . carvedilol (COREG) 6.25 MG tablet Take 1 tablet (6.25 mg total) by mouth 2 (two) times daily.  . Cholecalciferol (VITAMIN D3) 5000 units CAPS Take 1 capsule (5,000 Units total) by mouth daily.  . clopidogrel (PLAVIX) 75 MG tablet Take 75 mg by mouth daily.  . diclofenac sodium (VOLTAREN) 1 % GEL Apply 4 g topically 4 (four) times daily. (Patient taking differently: Apply 2-4 g topically daily as needed (for leg pain). )  . dicyclomine (BENTYL) 20 MG tablet Take 1 tablet (20 mg total) by mouth every 8 (eight) hours as needed for spasms.  . diphenoxylate-atropine (LOMOTIL) 2.5-0.025 MG tablet Take 2 tabs by mouth x 1, then 1 tab every 4 hrs PRN, max of 8 tabs in 24 hour time  . DULoxetine (CYMBALTA) 30 MG capsule Take 90 mg daily (60 mg + 30 mg) (Patient taking differently: Take 30 mg by mouth daily. )  . DULoxetine (CYMBALTA) 60 MG capsule Take 90 mg daily (60 mg + 30 mg)  . furosemide (LASIX) 40 MG tablet TAKE (1) TABLET BY MOUTH EACH MORNING.  Marland Kitchen ibuprofen (ADVIL,MOTRIN) 600 MG tablet Take 600 mg by mouth 2 (two) times daily as needed for moderate pain.   Marland Kitchen insulin aspart (NOVOLOG FLEXPEN) 100 UNIT/ML FlexPen Inject 15-21 Units into the skin 3 (three) times daily with meals.  . INVOKANA 100 MG TABS tablet TAKE 1 TABLET BY MOUTH DAILY BEFORE BREAKFAST.  Marland Kitchen LEVEMIR FLEXTOUCH 100 UNIT/ML Pen INJECT 80 UNITS UNDER THE SKIN EVERY DAY AT 10 PM  . lisinopril (PRINIVIL,ZESTRIL) 5 MG tablet Take 5 mg by mouth daily.  Marland Kitchen LORazepam (ATIVAN) 0.5 MG tablet Take 1 tablet (0.5 mg total) by mouth daily as needed for anxiety.  Marland Kitchen LYRICA 150 MG capsule take 150mg  by mouth three times daily  . meloxicam (MOBIC) 15 MG tablet Take 15 mg by mouth daily.  . metFORMIN (GLUCOPHAGE) 1000 MG tablet TAKE 1 TABLET(1000 MG) BY MOUTH TWICE DAILY WITH A MEAL  . nitroGLYCERIN (NITROSTAT) 0.4 MG SL tablet Place 1 tablet (0.4 mg  total) under the tongue every 5 (five) minutes as needed for chest pain.  . pantoprazole (PROTONIX) 40 MG tablet Take 1 tablet (40 mg total) by mouth 2 (two) times daily.  . potassium chloride (K-DUR) 10 MEQ tablet Take 1 tablet (10 mEq total) by mouth daily.  . pramipexole (MIRAPEX) 0.25 MG tablet Take 0.25 mg by mouth 2 (two) times daily.   . simvastatin (ZOCOR) 10 MG tablet TAKE (1) TABLET BY MOUTH DAILY. (Patient taking differently: TAKE (2) TABLET BY MOUTH DAILY.)  . tapentadol (NUCYNTA ER) 100 MG 12 hr tablet Take 100 mg by mouth every 12 (twelve) hours.  Marland Kitchen tiZANidine (ZANAFLEX) 4 MG capsule Take 4 mg by mouth 3 (three) times daily.  Marland Kitchen albuterol (PROVENTIL) (2.5 MG/3ML) 0.083% nebulizer solution Take 3 mLs (2.5 mg total) by nebulization every 6 (six) hours as needed for wheezing or shortness of breath.  . colestipol (COLESTID) 1 g tablet Take 1 tablet (1 g total) by mouth 2 (two) times daily. (Patient not taking: Reported on 11/07/2017)  . inFLIXimab 500 mg in sodium chloride 0.9 % 200 mL Inject 500 mg into the vein every 8 (eight) weeks.  . OXYGEN Inhale 3 L/day into the lungs at bedtime.    FAMILY HISTORY:  Her indicated that her mother is deceased. She indicated that her father is deceased. She indicated  that her sister is deceased. She indicated that two of her three brothers are alive. She indicated that her maternal grandmother is alive. She indicated that both of her sons are alive. She indicated that the status of her neg hx is unknown.   SOCIAL HISTORY: She  reports that she has been smoking cigarettes.  She started smoking about 44 years ago. She has a 84.00 pack-year smoking history. she has never used smokeless tobacco. She reports that she does not drink alcohol or use drugs.  REVIEW OF SYSTEMS:   Currently unobtainable  SUBJECTIVE:  As above  VITAL SIGNS: BP 93/65   Pulse (!) 132   Temp (!) 102.7 F (39.3 C) (Oral)   Resp (!) 33   Ht 5\' 4"  (1.626 m)   Wt 183 lb 6.8  oz (83.2 kg)   SpO2 94%   BMI 31.48 kg/m   HEMODYNAMICS:    VENTILATOR SETTINGS: Vent Mode: PRVC FiO2 (%):  [60 %-100 %] 60 % Set Rate:  [14 bmp] 14 bmp Vt Set:  [440 mL-520 mL] 440 mL PEEP:  [5 cmH20] 5 cmH20 Plateau Pressure:  [14 cmH20-18 cmH20] 14 cmH20  INTAKE / OUTPUT: I/O last 3 completed shifts: In: 1105 [I.V.:40; NG/GT:565; IV Piggyback:500] Out: 1150 [Urine:1150]  PHYSICAL EXAMINATION: General: This is a woman who appears her stated age was orally intubated and mechanically ventilated and unresponsive. Neuro: There is no response to voice there is no response to sternal rub.  Pupils are bilaterally dilated at 7 mm.  Limbs are flaccid and she is areflexic Cardiovascular: S1 and S2 are regular and rapid without murmur rub or gallop.  Telemetry showing pacer spikes. Lungs: Aspirations are unlabored, there is symmetric air movement, there are scattered rhonchi and wheezes more prominent on the right Abdomen: The abdomen is obese soft and nontender specifically appreciate no right upper quadrant tenderness   LABS:  BMET Recent Labs  Lab 11/12/17 0556 11/13/17 0541 11/15/17 0457  NA 143 141 148*  K 3.8 3.6 4.4  CL 116* 110 111  CO2 17* 20* 22  BUN 58* 61* 74*  CREATININE 0.71 0.66 0.84  GLUCOSE 65 98 172*    Electrolytes Recent Labs  Lab 11/12/17 0556 11/13/17 0541 11/15/17 0457  CALCIUM 8.4* 8.6* 9.4    CBC Recent Labs  Lab 11/12/17 0556 11/13/17 0541 11/15/17 0457  WBC 22.7* 23.5* 27.5*  HGB 12.4 13.4 16.6*  HCT 38.5 41.7 51.2*  PLT 173 152 113*    Coag's No results for input(s): APTT, INR in the last 168 hours.  Sepsis Markers No results for input(s): LATICACIDVEN, PROCALCITON, O2SATVEN in the last 168 hours.  ABG Recent Labs  Lab 11/12/17 0455 11/13/17 0500 11/15/17 2220  PHART 7.313* 7.365 7.505*  PCO2ART 35.5 35.4 27.1*  PO2ART 86.0 90.0 66.9*    Liver Enzymes Recent Labs  Lab 11/12/17 0556 11/13/17 0541 11/15/17 0457   AST 61* 63* 95*  ALT 63* 75* 125*  ALKPHOS 100 107 228*  BILITOT 0.6 0.5 1.0  ALBUMIN 2.1* 2.2* 2.9*    Cardiac Enzymes Recent Labs  Lab 11-20-17 0429  TROPONINI 0.81*    Glucose Recent Labs  Lab 11/15/17 1136 11/15/17 1706 11/15/17 2018 11/20/2017 0103 11/20/17 0424 2017/11/20 0742  GLUCAP 228* 337* 291* 309* 291* 192*    Imaging Dg Chest 1 View  Result Date: 11/15/2017 CLINICAL DATA:  Nasogastric tube placement. EXAM: CHEST 1 VIEW COMPARISON:  11/13/2017 FINDINGS: Nasal/orogastric tube passes below the diaphragm well into the stomach,  below the included field of view. Endotracheal tube noted on the prior study is no longer visualized, presumably removed. There is opacity at the right lung base silhouetting the right hemidiaphragm, increased from the prior study. This is likely atelectasis. Pneumonia is possible. Remainder of the lungs is clear. IMPRESSION: 1. Well-positioned nasal/orogastric tube. 2. Increased opacity at the right lung base consistent with atelectasis, pneumonia or a combination. Electronically Signed   By: Amie Portland M.D.   On: 11/15/2017 13:13   Dg Chest Portable 1 View  Result Date: 2017-11-20 CLINICAL DATA:  Followup endotracheal placement EXAM: PORTABLE CHEST 1 VIEW COMPARISON:  11/15/2017 FINDINGS: Endotracheal tube tip is 2 cm above the carina. Right arm PICC tip is in the SVC 3 cm above the right atrium. Nasogastric tube enters the abdomen with the tip in the gastric fundus. Pacemaker appears the same. Upper lungs are clear. Areas of atelectasis/infiltrate in both lower lobes persist, perhaps slightly improved following intubation. IMPRESSION: Endotracheal tube well position. Persistent basilar volume loss/infiltrate, probably slightly improved following intubation. Electronically Signed   By: Paulina Fusi M.D.   On: Nov 20, 2017 06:35     STUDIES:  Chest x-ray to my eye shows a well-placed endotracheal tube and atelectasis or infiltrate at the right  base  CULTURES: I have requested sputum blood and urine cultures on 2/24  ANTIBIOTICS: Vancomycin and meropenem were initiated on 2/24  DISCUSSION:      This is a 52 year old with O2 dependent COPD at baseline as well as Crohn's disease which is treated with an anti-TNF agent.  She was admitted with respiratory failure and influenza at [facility] subsequently developed type II cardiac ischemia with a Takotsubo appearance on echo.  She was transferred to [facility] due to diffuse ST segment elevations and is found to have diffuse coronary disease not requiring interventions with an EF estimated at 30% and a normal LVEDP.  She has required reintubation and is unresponsive and febrile  ASSESSMENT / PLAN:  PULMONARY A: She has a right lower lobe infiltrate or atelectasis.  Cultures have been ordered and I have empirically initiated a combination of meropenem and vancomycin.  Bronchodilating agents have been ordered.  With her current gas exchange, mental status, and mechanics there is no prospect for separation from mechanical ventilation   CARDIOVASCULAR A: He has compromised LV systolic function however LVEDP is normal.  Giving her a small dose of fluid hoping to control her heart rate.   RENAL A: Remarkably her renal function is normal   GASTROINTESTINAL A: She has abnormal LFTs with a number of potential explanations.  An ultrasound is pending to rule out biliary obstruction.  I have ordered an acute hepatitis panel to rule out reactivation related to the use of a TNF inhibitor.  Conceivably all of her LFT derangements are secondary to sepsis.  HEMATOLOGIC A: She has a bizarrely elevated hemoglobin which may need to be investigated once her acute issues are sorted out.  I suspect it is related to chronic hypoxia.  DVT prophylaxis is with subcu heparin  INFECTIOUS A: She is febrile.  There is an infiltrate on the chest x-ray and she has a decubitus ulcer.  She has been  hospitalized for more than 2 weeks.  I have started her on a combination of vancomycin and meropenem pending culture results.  Serial lactates and a pro-calcitonin have been ordered.   ENDOCRINE A: Glucose was somewhat elevated yesterday and have ordered sliding scale insulin.   NEUROLOGIC A:  I am extremely disturbed by her neurological exam which is performed several hours after she was intubated and given sedation.  A stat CT scan of the head is pending.  If there is no structural lesion will need to examine metabolic issues, and ionized calcium and ammonia have already been ordered.  Greater than 35 minutes was spent in the care of this patient today who has life-threatening illness including respiratory failure requiring intubation and mechanical ventilation.   Penny Pia, MD Pulmonary and Critical Care Medicine [facility] Pager: [phone]  10/31/2017, 9:27 AM

## 2017-11-16 NOTE — Progress Notes (Signed)
Pharmacy Antibiotic Note  [name] is a 52 y.o. female admitted on 11/10/2017 with COPD exacerbation, influenza infection, and ultimately respiratory distress requiring intubation. Pt transferred from [facility] to [facility] on 2/24 as code STEMI now s/p cath with ongoing encephalopathy and mechanical vent requirements.  Pharmacy has been consulted for vancomycin and meropenem dosing for possible sepsis.  Plan: -Vancomycin 750mg  IV q12h -Meropenem 1g IV q8h -Vancomycin level at Css -Watch renal funx, cultures, LOT  Height: 5\' 4"  (162.6 cm) Weight: 183 lb 6.8 oz (83.2 kg) IBW/kg (Calculated) : 54.7  Temp (24hrs), Avg:100.5 F (38.1 C), Min:97.5 F (36.4 C), Max:102.7 F (39.3 C)  Recent Labs  Lab 11/10/17 0533 11/11/17 0437 11/12/17 0556 11/13/17 0541 11/15/17 0457  WBC 24.8*  --  22.7* 23.5* 27.5*  CREATININE 0.83 0.79 0.71 0.66 0.84    Estimated Creatinine Clearance: 82.7 mL/min (by C-G formula based on SCr of 0.84 mg/dL).    Allergies  Allergen Reactions  . Flexeril [Cyclobenzaprine] Hives  . Amoxicillin Hives and Rash    Has patient had a PCN reaction causing immediate rash, facial/tongue/throat swelling, SOB or lightheadedness with hypotension: Yes Has patient had a PCN reaction causing severe rash involving mucus membranes or skin necrosis: Yes Has patient had a PCN reaction that required hospitalization No Has patient had a PCN reaction occurring within the last 10 years: Yes If all of the above answers are "NO", then may proceed with Cephalosporin use.     Antimicrobials this admission: Meropenem 2/24 >> Vanc 2/11 >> 2/14; 2/24 >> Levaquin 2/11 >> 2/14 Cefepime 2/12 >> Tamiflu 2/11>>2/16  Dose adjustments this admission: n/a  Microbiology results: 2/11 BCx: ng final 2/12 MRSA PCR:  negative 2/12 influenza + on tamiflu  Thank you for allowing pharmacy to be a part of this patient's care.  Fredonia Highland, PharmD, BCPS PGY-2 Cardiology Pharmacy  Resident Pager: [phone] 11/05/2017

## 2017-11-16 NOTE — Progress Notes (Signed)
   10/30/2017 0640  Clinical Encounter Type  Visited With Family;Health care provider;Patient not available  Visit Type ED  Referral From Nurse  Consult/Referral To Chaplain  Stress Factors  Family Stress Factors Health changes   Responded to a Code Stemi.  Patient arrived from [facility].  Medical team assessed and moved to Cath Lab.  EMT indicated family on the way.  Met the 2 brothers and the patient's fiance.  Escorted them to [facility] waiting and introduced the cardiologist to the family.  Provided hospitality and some time for them to share about the patient.  Will follow and support as needed. Chaplain Katherene Ponto

## 2017-11-16 NOTE — H&P (Signed)
Cardiology Admission History and Physical:   Patient ID: [name]; MRN: [id number]; DOB: 1966-04-24   Admission date: 11/19/2017  Primary Care Provider: [facility] Primary Cardiologist: Diona Browner Primary Electrophysiologist:  [name]  Chief Complaint:  Concern for STEMI  Patient Profile:   [name] is a 52 y.o. 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 Transfer to [facility] initiated for ECG changes concerning for STEMI.   History of Present Illness:   [name] is a 52 y.o. 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 Transfer to [facility] initiated for ECG changes concerning for STEMI.   The patient was hospitalized at [facility] on 2/11 with COPD exacerbation and influenza with repsiratory failure requiring intubation. She has been followed by cardiology over her hospital course for new reduced EF of 30% on echocardiogram that was attributed to stress cardiomyopathy. She was noted to have elevated troponin that peaked at 1.68 that was felt to be type II event in the setting of her stress cardiomyopathy. She was treated medically with ASA, clopidogrel, and her heart failure has been managed with furosemide and beta blockers. Ischemic evaluation was anticipated based on clinical trajectory.  Over the past several days, the patient was extubated, although she has remained encephalopathic. Her hospital course has otherwise been complicated by sepsis, DKA, and transaminitis. This morning, the patient was noted to become more tachycardic with worsening  respiratory status. She was started on BiPAP. Her telemetry was reviewed, and she was noted to have ST elevations, and ECG was obtained and showed atrial paced rhythm with ST elevations in I, II, II, AVF and lateral precordial leads. Given her encephalopathy, she was unable to report any symptoms. Code STEMI was activated, and she was transferred to [facility].  Direct transfer to the cath lab was anticipated, but she had worsening respiratory status en route despite BiPAP, and she was redirected to the ED. She was intubated in the ED and then transferred up to the cath lab. She will undergo LHC/RHC.     Past Medical History:  Diagnosis Date  . Anxiety   . Arthritis   . Cardiac pacemaker in situ 2009   DDD [employer] -- [place] 60  . COPD with asthma (HCC)    GOLD 2-3 --  pulmologist (last visit 2011) Dr. Marchelle Gearing  . Crohn's disease (HCC)    Large intestine  . Depression 2016   PTSD  . Essential hypertension   . Family history of adverse reaction to anesthesia    father- stop breathing surgery   . Gastroparesis   . GERD (gastroesophageal reflux disease)   . History of hiatal hernia   . History of kidney stones   . History of stroke    Jun 2011 -- right hand weakness  . History of syncope   . Inappropriate sinus tachycardia    Sinus node modification 02-25-2003 by Dr. Lewayne Bunting  . OSA (obstructive sleep apnea)    Study done 2005 -- pt refused CPAP/previously was using nocturnal oxygen until one year ago pt states PCP is monitoring pt without  . Pelvic pain in female   .  PTSD (post-traumatic stress disorder)   . Sinus node dysfunction (HCC)    Symptomatic bradycardia  . Type 2 diabetes mellitus (HCC)   . Wears glasses     Past Surgical History:  Procedure Laterality Date  . 24 HOUR PH STUDY N/A 12/30/2016   Procedure: 24 HOUR PH STUDY;  Surgeon: Ruffin Frederick, MD;  Location: [facility];  Service: Gastroenterology;  Laterality: N/A;  . ABDOMINAL  HYSTERECTOMY    . BILATERAL KNEE ARTHROSCOPY W/ CHONDROMALACIA PATELLA  bilateral ---- 12-26-2010; 08-14-2009;  04-25-2008;  03-09-2007   additional same surgery, Left knee 2005;  x2 2006 ---  Right knee 2005;  2006;  x2  2007  . CARDIAC CATHETERIZATION  05-08-2001  dr Nicki Guadalajara   normal coronary arteries and LVF  . CARDIAC ELECTROPHYSIOLOGY STUDY W/  SINUS NODE MODIFICATION  02-25-2003  dr Sharlot Gowda Isamar Nazir  . CARDIAC PACEMAKER PLACEMENT  10-16-2007  dr Lewayne Bunting   DDD-- [facility] 60  . CARDIOVASCULAR STRESS TEST  08-02-2010  dr Diona Browner   normal lexiscan study/  ef 59%  . CESAREAN SECTION  x2  . CHOLECYSTECTOMY  1994  . COLONOSCOPY N/A 05/23/2017   Procedure: COLONOSCOPY;  Surgeon: Benancio Deeds, MD;  Location: [facility];  Service: Gastroenterology;  Laterality: N/A;  . COLONOSCOPY WITH PROPOFOL N/A 09/27/2016   Procedure: COLONOSCOPY WITH PROPOFOL;  Surgeon: Ruffin Frederick, MD;  Location: [facility];  Service: Gastroenterology;  Laterality: N/A;  . CYSTO/  URETEROSCOPIC STONE EXTRACTION  03/ 2005  . CYSTOSCOPY WITH HYDRODISTENSION AND BIOPSY N/A 05/23/2015   Procedure: CYSTOSCOPY/BIOPSY/HYDRODISTENSION;  Surgeon: Su Grand, MD;  Location: [facility];  Service: Urology;  Laterality: N/A;  . ESOPHAGEAL MANOMETRY N/A 12/30/2016   Procedure: ESOPHAGEAL MANOMETRY (EM);  Surgeon: Ruffin Frederick, MD;  Location: [facility];  Service: Gastroenterology;  Laterality: N/A;  . ESOPHAGOGASTRODUODENOSCOPY (EGD) WITH PROPOFOL N/A 09/27/2016   Procedure: ESOPHAGOGASTRODUODENOSCOPY (EGD) WITH PROPOFOL;  Surgeon: Ruffin Frederick, MD;  Location: [facility];  Service: Gastroenterology;  Laterality: N/A;  . ESOPHAGOGASTRODUODENOSCOPY (EGD) WITH PROPOFOL N/A 05/23/2017   Procedure: ESOPHAGOGASTRODUODENOSCOPY (EGD) WITH PROPOFOL;  Surgeon: Benancio Deeds, MD;  Location: [facility];  Service: Gastroenterology;  Laterality: N/A;  . MULTIPLE  EXTRACTIONS WITH ALVEOLOPLASTY    . SAVORY DILATION N/A 05/23/2017   Procedure: SAVORY DILATION;  Surgeon: Benancio Deeds, MD;  Location: [facility];  Service: Gastroenterology;  Laterality: N/A;  . TONSILLECTOMY  11-25-2002     Medications Prior to Admission: Prior to Admission medications   Medication Sig Start Date End Date Taking? Authorizing Provider  acetaminophen (TYLENOL) 500 MG tablet Take 500 mg by mouth every 6 (six) hours as needed for mild pain or moderate pain.   Yes [provider]  aspirin EC 81 MG tablet Take 81 mg by mouth daily.   Yes [provider]  carvedilol (COREG) 6.25 MG tablet Take 1 tablet (6.25 mg total) by mouth 2 (two) times daily. 07/08/17  Yes Jonelle Sidle, MD  Cholecalciferol (VITAMIN D3) 5000 units CAPS Take 1 capsule (5,000 Units total) by mouth daily. 11/19/16  Yes Roma Kayser, MD  clopidogrel (PLAVIX) 75 MG tablet Take 75 mg by mouth daily.   Yes [provider]  diclofenac sodium (VOLTAREN) 1 % GEL Apply 4 g topically 4 (four) times daily. Patient taking differently: Apply 2-4 g topically daily as needed (for leg pain).  11/09/15  Yes Dettinger, Elige Radon, MD  dicyclomine (BENTYL) 20 MG tablet Take  1 tablet (20 mg total) by mouth every 8 (eight) hours as needed for spasms. 04/17/17  Yes Armbruster, Willaim Rayas, MD  diphenoxylate-atropine (LOMOTIL) 2.5-0.025 MG tablet Take 2 tabs by mouth x 1, then 1 tab every 4 hrs PRN, max of 8 tabs in 24 hour time 07/14/17  Yes Armbruster, Willaim Rayas, MD  DULoxetine (CYMBALTA) 30 MG capsule Take 90 mg daily (60 mg + 30 mg) Patient taking differently: Take 30 mg by mouth daily.  10/13/17  Yes Neysa Hotter, MD  DULoxetine (CYMBALTA) 60 MG capsule Take 90 mg daily (60 mg + 30 mg) 10/13/17  Yes Hisada, Reina, MD  furosemide (LASIX) 40 MG tablet TAKE (1) TABLET BY MOUTH EACH MORNING. 07/08/17  Yes Jonelle Sidle, MD  ibuprofen (ADVIL,MOTRIN) 600 MG tablet Take 600 mg by mouth 2  (two) times daily as needed for moderate pain.    Yes [provider]  insulin aspart (NOVOLOG FLEXPEN) 100 UNIT/ML FlexPen Inject 15-21 Units into the skin 3 (three) times daily with meals. 08/12/16  Yes Nida, Denman George, MD  INVOKANA 100 MG TABS tablet TAKE 1 TABLET BY MOUTH DAILY BEFORE BREAKFAST. 03/31/17  Yes Nida, Denman George, MD  LEVEMIR FLEXTOUCH 100 UNIT/ML Pen INJECT 80 UNITS UNDER THE SKIN EVERY DAY AT 10 PM 11/20/16  Yes Nida, Denman George, MD  lisinopril (PRINIVIL,ZESTRIL) 5 MG tablet Take 5 mg by mouth daily.   Yes [provider]  LORazepam (ATIVAN) 0.5 MG tablet Take 1 tablet (0.5 mg total) by mouth daily as needed for anxiety. 10/13/17  Yes Neysa Hotter, MD  LYRICA 150 MG capsule take 150mg  by mouth three times daily 10/23/16  Yes [provider]  meloxicam (MOBIC) 15 MG tablet Take 15 mg by mouth daily.   Yes [provider]  metFORMIN (GLUCOPHAGE) 1000 MG tablet TAKE 1 TABLET(1000 MG) BY MOUTH TWICE DAILY WITH A MEAL 08/05/16  Yes Jacquelin Hawking, PA-C  nitroGLYCERIN (NITROSTAT) 0.4 MG SL tablet Place 1 tablet (0.4 mg total) under the tongue every 5 (five) minutes as needed for chest pain. 04/22/16  Yes Devoria Albe, MD  pantoprazole (PROTONIX) 40 MG tablet Take 1 tablet (40 mg total) by mouth 2 (two) times daily. 09/30/17  Yes Armbruster, Willaim Rayas, MD  potassium chloride (K-DUR) 10 MEQ tablet Take 1 tablet (10 mEq total) by mouth daily. 07/08/17 11/12/2017 Yes Jonelle Sidle, MD  pramipexole (MIRAPEX) 0.25 MG tablet Take 0.25 mg by mouth 2 (two) times daily.    Yes [provider]  simvastatin (ZOCOR) 10 MG tablet TAKE (1) TABLET BY MOUTH DAILY. Patient taking differently: TAKE (2) TABLET BY MOUTH DAILY. 05/13/17  Yes Jonelle Sidle, MD  tapentadol [facility] ER) 100 MG 12 hr tablet Take 100 mg by mouth every 12 (twelve) hours.   Yes [provider]  tiZANidine (ZANAFLEX) 4 MG capsule Take 4 mg by mouth 3 (three) times  daily.   Yes [provider]  albuterol (PROVENTIL) (2.5 MG/3ML) 0.083% nebulizer solution Take 3 mLs (2.5 mg total) by nebulization every 6 (six) hours as needed for wheezing or shortness of breath. 11/09/15   Dettinger, Elige Radon, MD  colestipol (COLESTID) 1 g tablet Take 1 tablet (1 g total) by mouth 2 (two) times daily. Patient not taking: Reported on 11-12-17 04/18/17   Benancio Deeds, MD  inFLIXimab 500 mg in sodium chloride 0.9 % 200 mL Inject 500 mg into the vein every 8 (eight) weeks. 03/11/17   Armbruster, Willaim Rayas, MD  OXYGEN Inhale 3 L/day into the lungs at bedtime.    [provider]     Allergies:    Allergies  Allergen Reactions  . Flexeril [Cyclobenzaprine] Hives  . Amoxicillin Hives and Rash    Has patient had a PCN reaction causing immediate rash, facial/tongue/throat swelling, SOB or lightheadedness with hypotension: Yes Has patient had a PCN reaction causing severe rash involving mucus membranes or skin necrosis: Yes Has patient had a PCN reaction that required hospitalization No Has patient had a PCN reaction occurring within the last 10 years: Yes If all of the above answers are "NO", then may proceed with Cephalosporin use.     Social History:   Social History   Socioeconomic History  . Marital status: Divorced    Spouse name: Not on file  . Number of children: 2  . Years of education: Not on file  . Highest education level: Not on file  Social Needs  . Financial resource strain: Not on file  . Food insecurity - worry: Not on file  . Food insecurity - inability: Not on file  . Transportation needs - medical: Not on file  . Transportation needs - non-medical: Not on file  Occupational History  . Occupation: disabled  Tobacco Use  . Smoking status: Current Every Day Smoker    Packs/day: 2.00    Years: 42.00    Pack years: 84.00    Types: Cigarettes    Start date: 02/02/1973  . Smokeless tobacco: Never Used  . Tobacco comment:  smoke about 2 packs because I recently lost my mom.  Substance and Sexual Activity  . Alcohol use: No    Alcohol/week: 0.0 oz  . Drug use: No    Comment: per 10/27/2016  pt no  . Sexual activity: Not Currently    Birth control/protection: Post-menopausal  Other Topics Concern  . Not on file  Social History Narrative   Married, 2 boys. 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     Family History:   The patient's family history includes Asthma in her mother; Cancer in her father; Cirrhosis in her maternal grandmother, mother, and sister; Depression in her brother and mother; Diabetes in her brother, brother, mother, and sister; Emphysema in her father; Heart attack in her brother and father; Heart disease in her brother, brother, father, and mother; Seizures in her brother; Stroke in her mother. There is no history of Colon cancer, Stomach cancer, Esophageal cancer, Rectal cancer, or Liver cancer.    ROS:  Unable to complete ROS given AMS, intubation  Physical Exam/Data:   Vitals:   12/10/2017 0617 12/10/2017 0620 2017/12/10 0621 December 10, 2017 0629  BP:  (!) 71/30 (!) 87/50   Pulse: (!) 125 (!) 120 (!) 120   Resp: (!) 24  14   Temp:      TempSrc:      SpO2: (!) 89% 96% 96% 96%  Weight:      Height:        Intake/Output Summary (Last 24 hours) at 2017/12/10 0654 Last data filed at 12/10/2017 0526 Gross per 24 hour  Intake 1075 ml  Output 400 ml  Net 675 ml   Filed Weights   11/14/17 0500 11/15/17 0530 2017/12/10 0500  Weight: 85 kg (187 lb 6.3 oz) 83.2 kg (183 lb 6.8 oz) 83.2 kg (183 lb 6.8 oz)   Body mass index is 31.48 kg/m.  General:  Chronically ill appearing. Intubated HEENT: ETT in airay Lymph: no adenopathy Neck:  No apparent JVD Vascular: Weak radial pulse  Cardiac:  Distant heart sounds. Tachycardic with no apparent murmur Lungs (immediately prior to intbation): Poor air movement while on BiPaP Abd: soft, non-tender, non-distended Ext: no LE  edema Musculoskeletal:  Normal muscle bulk  Skin: warm and dry  Neuro:  Unable to assess as altered and intubated Psych:  Unable to assess as altered and intubated   EKG:  The ECG that was done and was personally reviewed and demonstrates sinus tachycardia with diffuse ST elevation   Relevant CV Studies: TTE 10/2017: - Left ventricle: The cavity size was normal. Wall thickness was   normal. Systolic function was moderately to severely reduced. The   estimated ejection fraction was = 30%. - Regional wall motion abnormality: Akinesis of the mid-apical   anterior, mid anteroseptal, apical inferior, apical septal,   apical lateral, and apical myocardium. - Aortic valve: Mildly calcified annulus. Trileaflet; normal   thickness leaflets. Valve area (VTI): 2.98 cm^2. Valve area   (Vmax): 2.92 cm^2. Valve area (Vmean): 2.61 cm^2. - Mitral valve: Mildly calcified annulus. Normal thickness leaflets - Tricuspid valve: There was mild-moderate regurgitation. - Technically difficult study. Echcontrast used to Tax adviser. Impressions: - Acute decrease in LVEF and new wall motion abnormalities since   January 23,2019 study. Pattern of wall motion abnormalities   typical of stress induced cardiomyopathy/Takotsubo syndrome,   however this diagnosis requires exclusing of coronary artery   disease.  Laboratory Data:  Chemistry Recent Labs  Lab 11/13/17 0541 11/15/17 0457  NA 141 148*  K 3.6 4.4  CL 110 111  CO2 20* 22  GLUCOSE 98 172*  BUN 61* 74*  CREATININE 0.66 0.84  CALCIUM 8.6* 9.4  GFRNONAA >60 >60  GFRAA >60 >60  ANIONGAP 11 15    Recent Labs  Lab 11/13/17 0541 11/15/17 0457  PROT 6.2* 7.6  ALBUMIN 2.2* 2.9*  AST 63* 95*  ALT 75* 125*  ALKPHOS 107 228*  BILITOT 0.5 1.0   Hematology Recent Labs  Lab 11/13/17 0541 11/15/17 0457  WBC 23.5* 27.5*  RBC 4.50 5.44*  HGB 13.4 16.6*  HCT 41.7 51.2*  MCV 92.7 94.1  MCH 29.8 30.5  MCHC 32.1 32.4  RDW 14.9  14.9  PLT 152 113*   Cardiac Enzymes Recent Labs  Lab 2017-12-03 0429  TROPONINI 0.81*   No results for input(s): TROPIPOC in the last 168 hours.  BNPNo results for input(s): BNP, PROBNP in the last 168 hours.  DDimer No results for input(s): DDIMER in the last 168 hours.  Radiology/Studies:  Dg Chest 1 View  Result Date: 11/15/2017 CLINICAL DATA:  Nasogastric tube placement. EXAM: CHEST 1 VIEW COMPARISON:  11/13/2017 FINDINGS: Nasal/orogastric tube passes below the diaphragm well into the stomach, below the included field of view. Endotracheal tube noted on the prior study is no longer visualized, presumably removed. There is opacity at the right lung base silhouetting the right hemidiaphragm, increased from the prior study. This is likely atelectasis. Pneumonia is possible. Remainder of the lungs is clear. IMPRESSION: 1. Well-positioned nasal/orogastric tube. 2. Increased opacity at the right lung base consistent with atelectasis, pneumonia or a combination. Electronically Signed   By: Amie Portland M.D.   On: 11/15/2017 13:13   Dg Chest Portable 1 View  Result Date: Dec 03, 2017 CLINICAL DATA:  Followup endotracheal placement EXAM: PORTABLE CHEST 1 VIEW COMPARISON:  11/15/2017 FINDINGS: Endotracheal tube tip is 2 cm above the carina. Right arm PICC tip is in the SVC 3  cm above the right atrium. Nasogastric tube enters the abdomen with the tip in the gastric fundus. Pacemaker appears the same. Upper lungs are clear. Areas of atelectasis/infiltrate in both lower lobes persist, perhaps slightly improved following intubation. IMPRESSION: Endotracheal tube well position. Persistent basilar volume loss/infiltrate, probably slightly improved following intubation. Electronically Signed   By: Paulina Fusi M.D.   On: 11/01/2017 06:35    Assessment and Plan:   [name] is a 52 y.o. 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 Transfer to [facility] initiated for ECG changes concerning for STEMI.   Abnormal ECG HFrEF The patient was admitted to [facility] several weeks ago with respiratory failure 2/2 COPD exacerbation and influenza. 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 She has not yet had ischemic evaluation. She is transferred to [facility] today after new changes on ECG with diffuse ST elevations. It is unclear if these changes represent plaque rupture (vs pericarditis or other manifestation of critical illness), but she will need immediate ischemic evaluation. Given her new reduced EF with ongoing borderline hemodynamic status, will plan for LHC/RHC. -LHC/RHC now -Admit to ICU -Repeat echocardiogram ordered -Continue ASA, clopidogrel -Continue atorvastatin  -Continue carvedilol. Can further titrate OMT and diuresis as appropriate based on workup and clinical trajectory  Acute on subacute on chronic respiratory failure COPD Influenza, pneumonia The patient was intubated and subsequently extubated at [facility]. She has been followed by pulmonology at [facility]. She had recurrent respiratory failure en route from [facility] and was intubated in the ED. -Has completed course of antimicrobial thearapy -Continue steroids -Continue ipratropium, levalbuterol, budesonide -Continue mechanical ventilation -Will need continued pulmonology assistance  Encephalopathy, presumed metabolic Prior to reintubation, the patient had ongoing encephalopathy and was unable to follow commands. CT head was unrevealing. -[facility] team had planned for neurology consultation, which she will likely need following extubation again  Transaminitis Attributed to critical illness and sepsis -RUQ [place] ordered  -Continue to monitor   DKA Diabetes Has transitioned to SSI +  Lantus  Hx CVA -Continue ASA, clopidogrel  Protein caloric malnutrition -NGT and tube feeds started at [facility]  History of inappropriate sinus tachycardia status post sinus node modification and subsequent [employer] pacemaker placement -Continue to monitor   Severity of Illness: The appropriate patient status for this patient is INPATIENT. Inpatient status is judged to be reasonable and necessary in order to provide the required intensity of service to ensure the patient's safety. The patient's presenting symptoms, physical exam findings, and initial radiographic and laboratory data in the context of their chronic comorbidities is felt to place them at high risk for further clinical deterioration. Furthermore, it is not anticipated that the patient will be medically stable for discharge from the hospital within 2 midnights of admission. The following factors support the patient status of inpatient.   " The patient's presenting symptoms include respiratory failure. " The worrisome physical exam findings include respiratory failure. " The initial radiographic and laboratory data are worrisome because of ECG changes. " The chronic co-morbidities include COPD, heart failure.   * I certify that at the point of admission it is my clinical judgment that the patient will require inpatient hospital care spanning beyond 2 midnights from the point of admission due to high intensity of service,  high risk for further deterioration and high frequency of surveillance required.*    For questions or updates, please contact [facility] Please consult [email or web address] for contact info under Cardiology/STEMI.    Signed, Ernest Mallick, MD  10/24/2017 6:54 AM

## 2017-11-16 NOTE — Progress Notes (Signed)
RT unable to get radial aline placed.  Notified Dr. Wallace Cullens, instructed to keep sheath in until he comes up and gets aline access.

## 2017-11-17 ENCOUNTER — Ambulatory Visit (HOSPITAL_COMMUNITY): Payer: Medicaid Other | Admitting: Psychiatry

## 2017-11-17 ENCOUNTER — Inpatient Hospital Stay (HOSPITAL_COMMUNITY): Payer: Medicaid Other

## 2017-11-17 ENCOUNTER — Encounter (HOSPITAL_COMMUNITY): Payer: Self-pay | Admitting: Cardiology

## 2017-11-17 DIAGNOSIS — I5021 Acute systolic (congestive) heart failure: Secondary | ICD-10-CM

## 2017-11-17 DIAGNOSIS — E440 Moderate protein-calorie malnutrition: Secondary | ICD-10-CM

## 2017-11-17 DIAGNOSIS — J9601 Acute respiratory failure with hypoxia: Secondary | ICD-10-CM

## 2017-11-17 DIAGNOSIS — G9340 Encephalopathy, unspecified: Secondary | ICD-10-CM

## 2017-11-17 DIAGNOSIS — Z515 Encounter for palliative care: Secondary | ICD-10-CM

## 2017-11-17 DIAGNOSIS — J1100 Influenza due to unidentified influenza virus with unspecified type of pneumonia: Secondary | ICD-10-CM

## 2017-11-17 LAB — CALCIUM, IONIZED: Calcium, Ionized, Serum: 4.7 mg/dL (ref 4.5–5.6)

## 2017-11-17 LAB — BASIC METABOLIC PANEL
Anion gap: 7 (ref 5–15)
BUN: 58 mg/dL — ABNORMAL HIGH (ref 6–20)
CALCIUM: 8.7 mg/dL — AB (ref 8.9–10.3)
CHLORIDE: 123 mmol/L — AB (ref 101–111)
CO2: 22 mmol/L (ref 22–32)
CREATININE: 0.87 mg/dL (ref 0.44–1.00)
GFR calc Af Amer: >60 mL/min (ref >60)
GFR calc non Af Amer: >60 mL/min (ref >60)
GLUCOSE: 192 mg/dL — AB (ref 65–99)
Potassium: 4.1 mmol/L (ref 3.5–5.1)
Sodium: 152 mmol/L — ABNORMAL HIGH (ref 135–145)

## 2017-11-17 LAB — GLUCOSE, CAPILLARY
GLUCOSE-CAPILLARY: 136 mg/dL — AB (ref 65–99)
GLUCOSE-CAPILLARY: 129 mg/dL — AB (ref 65–99)
GLUCOSE-CAPILLARY: 174 mg/dL — AB (ref 65–99)
GLUCOSE-CAPILLARY: 164 mg/dL — AB (ref 65–99)
GLUCOSE-CAPILLARY: 254 mg/dL — AB (ref 65–99)
Glucose-Capillary: 216 mg/dL — ABNORMAL HIGH (ref 65–99)
Glucose-Capillary: 279 mg/dL — ABNORMAL HIGH (ref 65–99)

## 2017-11-17 LAB — BLOOD GAS, ARTERIAL
ACID-BASE DEFICIT: 0.3 mmol/L (ref 0.0–2.0)
BICARBONATE: 23.9 mmol/L (ref 20.0–28.0)
DRAWN BY: 345601
FIO2: 60
MECHVT: 440 mL
O2 Saturation: 95.1 %
PATIENT TEMPERATURE: 98.6
PEEP: 5 cmH2O
PH ART: 7.396 (ref 7.350–7.450)
PO2 ART: 86.1 mmHg (ref 83.0–108.0)
RATE: 14 resp/min
pCO2 arterial: 39.8 mmHg (ref 32.0–48.0)

## 2017-11-17 LAB — POCT I-STAT 3, ART BLOOD GAS (G3+)
Acid-base deficit: 7 mmol/L — ABNORMAL HIGH (ref 0.0–2.0)
BICARBONATE: 19.5 mmol/L — AB (ref 20.0–28.0)
O2 Saturation: 99 %
PCO2 ART: 40.3 mmHg (ref 32.0–48.0)
PH ART: 7.293 — AB (ref 7.350–7.450)
TCO2: 21 mmol/L — AB (ref 22–32)
pO2, Arterial: 161 mmHg — ABNORMAL HIGH (ref 83.0–108.0)

## 2017-11-17 LAB — POCT I-STAT 3, VENOUS BLOOD GAS (G3P V)
ACID-BASE DEFICIT: 6 mmol/L — AB (ref 0.0–2.0)
BICARBONATE: 21.1 mmol/L (ref 20.0–28.0)
O2 SAT: 67 %
PO2 VEN: 40 mmHg (ref 32.0–45.0)
TCO2: 22 mmol/L (ref 22–32)
pCO2, Ven: 45.6 mmHg (ref 44.0–60.0)
pH, Ven: 7.273 (ref 7.250–7.430)

## 2017-11-17 LAB — CBC
HCT: 44.6 % (ref 36.0–46.0)
Hemoglobin: 13.9 g/dL (ref 12.0–15.0)
MCH: 30.2 pg (ref 26.0–34.0)
MCHC: 31.2 g/dL (ref 30.0–36.0)
MCV: 96.7 fL (ref 78.0–100.0)
Platelets: 54 10*3/uL — ABNORMAL LOW (ref 150–400)
RBC: 4.61 MIL/uL (ref 3.87–5.11)
RDW: 15.8 % — AB (ref 11.5–15.5)
WBC: 25.9 10*3/uL — ABNORMAL HIGH (ref 4.0–10.5)

## 2017-11-17 LAB — URINE CULTURE: CULTURE: NO GROWTH

## 2017-11-17 LAB — ECHOCARDIOGRAM LIMITED
HEIGHTINCHES: 64 in
WEIGHTICAEL: 3015.89 [oz_av]

## 2017-11-17 LAB — HEPATITIS PANEL, ACUTE
HEP B S AG: NEGATIVE
Hep A IgM: NEGATIVE
Hep B C IgM: NEGATIVE

## 2017-11-17 LAB — MAGNESIUM: Magnesium: 2.5 mg/dL — ABNORMAL HIGH (ref 1.7–2.4)

## 2017-11-17 LAB — POCT ACTIVATED CLOTTING TIME: Activated Clotting Time: 186 seconds

## 2017-11-17 LAB — PROCALCITONIN: Procalcitonin: 1.36 ng/mL

## 2017-11-17 LAB — PHOSPHORUS: Phosphorus: 3.2 mg/dL (ref 2.5–4.6)

## 2017-11-17 MED ORDER — SODIUM CHLORIDE 0.45 % IV SOLN
INTRAVENOUS | Status: DC
  Administered 2017-11-17 (×2): via INTRAVENOUS

## 2017-11-17 MED ORDER — LACTULOSE 10 GM/15ML PO SOLN
30.0000 g | Freq: Three times a day (TID) | ORAL | Status: DC
  Administered 2017-11-17 – 2017-11-18 (×5): 30 g
  Filled 2017-11-17 (×4): qty 45

## 2017-11-17 MED ORDER — IOPAMIDOL (ISOVUE-370) INJECTION 76%
INTRAVENOUS | Status: AC
  Administered 2017-11-17: 50 mL
  Filled 2017-11-17: qty 50

## 2017-11-17 MED ORDER — PERFLUTREN LIPID MICROSPHERE
INTRAVENOUS | Status: AC
  Administered 2017-11-17: 2 mL
  Filled 2017-11-17: qty 10

## 2017-11-17 MED ORDER — POTASSIUM CHLORIDE 20 MEQ/15ML (10%) PO SOLN
10.0000 meq | Freq: Every day | ORAL | Status: DC
  Administered 2017-11-17: 10 meq via ORAL
  Filled 2017-11-17: qty 15

## 2017-11-17 MED ORDER — ASPIRIN 81 MG PO CHEW
81.0000 mg | CHEWABLE_TABLET | Freq: Every day | ORAL | Status: DC
  Administered 2017-11-17 – 2017-11-18 (×2): 81 mg
  Filled 2017-11-17: qty 1

## 2017-11-17 MED ORDER — VITAL HIGH PROTEIN PO LIQD
1000.0000 mL | ORAL | Status: DC
  Administered 2017-11-17: 1000 mL

## 2017-11-17 MED ORDER — PRO-STAT SUGAR FREE PO LIQD
30.0000 mL | Freq: Two times a day (BID) | ORAL | Status: DC
  Administered 2017-11-17 – 2017-11-21 (×9): 30 mL
  Filled 2017-11-17 (×9): qty 30

## 2017-11-17 MED ORDER — ORAL CARE MOUTH RINSE
15.0000 mL | OROMUCOSAL | Status: DC
  Administered 2017-11-17 – 2017-11-21 (×41): 15 mL via OROMUCOSAL

## 2017-11-17 MED ORDER — ACETAMINOPHEN 160 MG/5ML PO SOLN
650.0000 mg | Freq: Four times a day (QID) | ORAL | Status: DC | PRN
Start: 2017-11-17 — End: 2017-11-22
  Administered 2017-11-17: 650 mg via ORAL
  Filled 2017-11-17 (×3): qty 20.3

## 2017-11-17 MED ORDER — FENTANYL CITRATE (PF) 100 MCG/2ML IJ SOLN
25.0000 ug | INTRAMUSCULAR | Status: DC | PRN
  Administered 2017-11-17 (×2): 50 ug via INTRAVENOUS
  Administered 2017-11-19 (×2): 100 ug via INTRAVENOUS
  Administered 2017-11-19: 50 ug via INTRAVENOUS
  Administered 2017-11-19 – 2017-11-21 (×6): 100 ug via INTRAVENOUS
  Filled 2017-11-17 (×11): qty 2

## 2017-11-17 MED ORDER — METHYLPREDNISOLONE SODIUM SUCC 40 MG IJ SOLR
40.0000 mg | INTRAMUSCULAR | Status: DC
  Administered 2017-11-18: 40 mg via INTRAVENOUS
  Filled 2017-11-17: qty 1

## 2017-11-17 MED ORDER — ASPIRIN 81 MG PO CHEW
81.0000 mg | CHEWABLE_TABLET | Freq: Every day | ORAL | Status: DC
  Filled 2017-11-17: qty 1

## 2017-11-17 MED ORDER — MORPHINE SULFATE (PF) 4 MG/ML IV SOLN: 1.0000 mg | INTRAVENOUS | Status: DC | PRN

## 2017-11-17 MED ORDER — COLLAGENASE 250 UNIT/GM EX OINT
TOPICAL_OINTMENT | Freq: Every day | CUTANEOUS | Status: DC
  Administered 2017-11-17 – 2017-11-18 (×2): via TOPICAL
  Filled 2017-11-17: qty 30

## 2017-11-17 MED ORDER — CHLORHEXIDINE GLUCONATE 0.12% ORAL RINSE (MEDLINE KIT)
15.0000 mL | Freq: Two times a day (BID) | OROMUCOSAL | Status: DC
  Administered 2017-11-17 – 2017-11-18 (×3): 15 mL via OROMUCOSAL

## 2017-11-17 MED ORDER — VITAL AF 1.2 CAL PO LIQD
1000.0000 mL | ORAL | Status: DC
  Administered 2017-11-17 – 2017-11-20 (×3): 1000 mL
  Filled 2017-11-17: qty 1000

## 2017-11-17 MED FILL — Verapamil HCl IV Soln 2.5 MG/ML: INTRAVENOUS | Qty: 2 | Status: AC

## 2017-11-17 NOTE — Progress Notes (Signed)
PULMONARY / CRITICAL CARE MEDICINE   Name: [name] MRN: [id number] DOB: 05-21-1966    ADMISSION DATE:  10/27/2017  CHIEF COMPLAINT:  Respiratory failure  HISTORY OF PRESENT ILLNESS:       This is a chronically ill 52 year old who presented to [facility] on 2/11 complaining of dyspnea nausea vomiting and diarrhea.  She was found to be influenza positive and required intubation and mechanical ventilation.  She was intubated until 2/21 and when she was extubated she was encephalopathic.  In addition she had an elevation of her cardiac enzymes and an echocardiogram was obtained showing an ejection fraction of 30% felt to be consistent with a Takotsubo syndrome.  She subsequently developed diffuse ST segment elevation with most pronounced elevation inferiorly were Q waves are present.  She was transferred to this hospital and on arrival required intubation for respiratory distress.  She was taken to the cardiac catheterization laboratory where she was found to have diffuse disease not requiring intervention.  Of note her LVEDP was normal at 11.  She was transferred to the intensive care unit where she is unresponsive despite receiving no sedative agents. She has a complex past medical history and it appears that she receives an anti-TNF agent for Crohn's disease.  She also appears to have chronic lung disease for which she is O2 dependent at home and she has a history of pacer placement.  A deep purulent sacral ulcer is reported.   ETT 2/11 >> , 2/24 >>  STUDIES:  2/22 head CT neg 2/24 head CT neg 2/24 [place] abd >> prior chole, no duct dilatation   CULTURES: 2/24  sputum  2/24 blood  2/24 urine   ANTIBIOTICS: 2/24 Vancomycin 2/24 meropenem     SUBJECTIVE:  Low gr febrile On 60%/ PEEP 5 UO ok On TB precautions for some reason ?    VITAL SIGNS: BP 107/75   Pulse (!) 114   Temp 100 F (37.8 C) (Esophageal)   Resp 15   Ht 5\' 4"  (1.626 m)   Wt 188 lb 7.9 oz (85.5 kg)    SpO2 95%   BMI 32.35 kg/m   HEMODYNAMICS: CVP:  [0 mmHg-16 mmHg] 5 mmHg  VENTILATOR SETTINGS: Vent Mode: PRVC FiO2 (%):  [60 %] 60 % Set Rate:  [14 bmp] 14 bmp Vt Set:  [440 mL] 440 mL PEEP:  [5 cmH20] 5 cmH20 Plateau Pressure:  [10 cmH20-15 cmH20] 15 cmH20  INTAKE / OUTPUT: I/O last 3 completed shifts: In: 3377.4 [I.V.:2062.4; NG/GT:565; IV Piggyback:750] Out: 1725 [Urine:1725]  PHYSICAL EXAMINATION: General: appears her stated age was orally intubated and mechanically ventilated and unresponsive on fent gtt Neuro: opens eyes to name, does not follow commands,pupils BEERTL Limbs are flaccid and she is areflexic Cardiovascular: S1 and S2 are regular and rapid without murmur rub or gallop.  Lungs:no acc muscle use, there is symmetric air movement, decreased BS BL Abdomen: The abdomen is obese soft and nontender specifically appreciate no right upper quadrant tenderness   LABS:  BMET Recent Labs  Lab 11/15/17 0457 10/31/2017 1019 11/17/17 0411  NA 148* 155* 152*  K 4.4 4.0 4.1  CL 111 125* 123*  CO2 22 21* 22  BUN 74* 92* 58*  CREATININE 0.84 1.15* 0.87  GLUCOSE 172* 139* 192*    Electrolytes Recent Labs  Lab 11/15/17 0457 11/17/2017 1019 11/17/17 0411  CALCIUM 9.4 8.2* 8.7*  MG  --   --  2.5*  PHOS  --   --  3.2    CBC Recent Labs  Lab 11/15/17 0457 03-Dec-2017 1019 11/17/17 0411  WBC 27.5* 26.0* 25.9*  HGB 16.6* 15.2* 13.9  HCT 51.2* 47.1* 44.6  PLT 113* 67* 54*    Coag's Recent Labs  Lab 2017/12/03 1019  INR 1.38    Sepsis Markers Recent Labs  Lab 03-Dec-2017 1019 12-03-17 1253  LATICACIDVEN 1.1 1.1  PROCALCITON 1.95  --     ABG Recent Labs  Lab 11/15/17 2220 12/03/17 0930 11/17/17 0650  PHART 7.505* 7.298* 7.396  PCO2ART 27.1* 37.9 39.8  PO2ART 66.9* 86.0 86.1    Liver Enzymes Recent Labs  Lab 11/13/17 0541 11/15/17 0457 12-03-17 1019  AST 63* 95* 110*  ALT 75* 125* 192*  ALKPHOS 107 228* 449*  BILITOT 0.5 1.0 0.9   ALBUMIN 2.2* 2.9* 2.3*    Cardiac Enzymes Recent Labs  Lab 12-03-2017 0429  TROPONINI 0.81*    Glucose Recent Labs  Lab 12-03-2017 0424 Dec 03, 2017 0742 December 03, 2017 1145 12/03/2017 1551 03-Dec-2017 1941 12/03/17 2311  GLUCAP 291* 192* 136* 129* 160* 172*    Imaging Ct Head Wo Contrast  Result Date: 12/03/17 CLINICAL DATA:  Dilated pupils.  Transfer from and other hospital. EXAM: CT HEAD WITHOUT CONTRAST TECHNIQUE: Contiguous axial images were obtained from the base of the skull through the vertex without intravenous contrast. COMPARISON:  11/14/2017 FINDINGS: Brain: No evidence of acute infarction, hemorrhage, hydrocephalus, extra-axial collection or mass lesion/mass effect. Vascular: No hyperdense vessel or unexpected calcification. Skull: Choose 1 Sinuses/Orbits: Globes and orbits are unremarkable. Mucous retention cyst with some dependent secretions in the left maxillary sinus. Remaining visualized sinuses and mastoid air cells are clear. Other: None. IMPRESSION: 1. No intracranial abnormality. No change from the recent prior exam. Electronically Signed   By: Amie Portland M.D.   On: 03-Dec-2017 10:15   US Abdomen Limited Ruq  Result Date: 03-Dec-2017 CLINICAL DATA:  Prior cholecystectomy. Transaminitis. Elevated liver function tests. EXAM: ULTRASOUND ABDOMEN LIMITED RIGHT UPPER QUADRANT COMPARISON:  03/25/2017 FINDINGS: Gallbladder: Surgically absent. Common bile duct: Diameter: 5 mm, within normal limits. Liver: Mildly increased echogenicity of the hepatic parenchyma, consistent with diffuse hepatocellular disease. No focal mass lesion identified. Portal vein is patent on color Doppler imaging with normal direction of blood flow towards the liver. IMPRESSION: Prior cholecystectomy.  No evidence of biliary ductal dilatation. Mild diffuse hepatocellular disease. No focal liver lesion identified. Consider liver elastography ultrasound to evaluate for hepatic fibrosis/cirrhosis. Electronically  Signed   By: Myles Rosenthal M.D.   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  She was admitted with respiratory failure and influenza at [facility] subsequently developed type II cardiac ischemia with a Takotsubo appearance on echo.  She was transferred to [facility] due to diffuse ST segment elevations and is found to have diffuse coronary disease not requiring interventions with an EF estimated at 30% and a normal LVEDP.  She has required reintubation and is unresponsive and febrile. 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 , HF rEF SSS s/p PPM  - COreg, ASA, plavix, lipitor  RENAL A: Hypernatremia 1/2 NS @ 75/h   GASTROINTESTINAL A:  Abnormal LFTs Crohn's disease abd [place] neg  -start TFs -follow LFTs intermittent  HEMATOLOGIC A:  Thrombocytopenia -HIT panel -AVOID heparin   INFECTIOUS A: Influenza pneumonia RLL HCAP  - vancomycin and meropenem -await cx data   ENDOCRINE A: DM-2 SSI   NEUROLOGIC A: Acute encephalopathy - head CT neg, ammonia slight high -Neuro input -lactulose for now  The patient is critically ill with multiple organ systems failure and requires high complexity decision making for assessment and support, frequent evaluation and titration of therapies, application of advanced monitoring technologies and extensive interpretation of multiple databases. Critical Care Time devoted to patient care services described in this note independent of APP/resident  time is 35 minutes.   Cyril Mourning MD. Tonny Bollman. [facility] Pager [phone] If no response call [phone]     11/17/2017, 8:24 AM

## 2017-11-17 NOTE — Progress Notes (Signed)
Wasted IV fentanyl in sink with Jamison Oka, RN.  Leanna Battles, RN

## 2017-11-17 NOTE — Progress Notes (Addendum)
Progress Note  Patient Name: [name] Date of Encounter: 11/17/2017 Primary Cardiologist: No primary care provider on file.   Subjective   Remains unresponsive. Remains intubated. Low grade fever. CT head negative.  Inpatient Medications    Scheduled Meds: . aspirin EC  81 mg Oral Daily  . atorvastatin  80 mg Oral q1800  . budesonide (PULMICORT) nebulizer solution  0.5 mg Nebulization BID  . chlorhexidine  15 mL Mouth Rinse BID  . chlorhexidine gluconate (MEDLINE KIT)  15 mL Mouth Rinse BID  . Chlorhexidine Gluconate Cloth  6 each Topical Daily  . clopidogrel  75 mg Oral Daily  . heparin  5,000 Units Subcutaneous Q8H  . insulin aspart  0-20 Units Subcutaneous Q4H  . ipratropium  0.5 mg Nebulization Q6H  . lactulose  30 g Per Tube TID  . levalbuterol  0.63 mg Nebulization Q6H  . mouth rinse  15 mL Mouth Rinse QID  . methylPREDNISolone (SOLU-MEDROL) injection  40 mg Intravenous Q12H  . pantoprazole sodium  40 mg Per Tube QHS  . potassium chloride  10 mEq Oral Daily  . sodium chloride flush  10-40 mL Intracatheter Q12H  . sodium chloride flush  3 mL Intravenous Q12H  . tapentadol  100 mg Oral Q12H   Continuous Infusions: . sodium chloride    . sodium chloride 250 mL (11/17/17 0600)  . sodium chloride    . fentaNYL infusion INTRAVENOUS 75 mcg/hr (11/17/17 0600)  . lactated ringers 100 mL/hr at 11/17/17 0600  . meropenem (MERREM) IV 1 g (11/17/17 0629)  . propofol (DIPRIVAN) infusion Stopped (11/13/17 0730)  . vancomycin Stopped (11/08/2017 2315)   PRN Meds: sodium chloride, sodium chloride, Place/Maintain arterial line **AND** sodium chloride, acetaminophen **OR** acetaminophen, levalbuterol, LORazepam, morphine injection, sodium chloride flush, sodium chloride flush   Vital Signs    Vitals:   11/17/17 0400 11/17/17 0500 11/17/17 0600 11/17/17 0700  BP: (!) 86/63  103/75 107/75  Pulse: (!) 116 (!) 111 (!) 114 (!) 114  Resp: 19 18 14 15   Temp:  100 F (37.8 C)     TempSrc:  Esophageal    SpO2: 93% 95% 95% 95%  Weight: 188 lb 7.9 oz (85.5 kg)     Height:        Intake/Output Summary (Last 24 hours) at 11/17/2017 0814 Last data filed at 11/17/2017 0600 Gross per 24 hour  Intake 2202.41 ml  Output 1325 ml  Net 877.41 ml   Filed Weights   11/15/17 0530 11/15/2017 0500 11/17/17 0400  Weight: 183 lb 6.8 oz (83.2 kg) 183 lb 6.8 oz (83.2 kg) 188 lb 7.9 oz (85.5 kg)    Telemetry    Persistent tachycardia - Personally Reviewed  ECG    11/09/2017 @ 4:19 AM Tachy 130's, diffuse ST elevations  - Personally Reviewed  Physical Exam   GEN: Intubated, mechanically ventilated. Unresponsive.   Cardiac: tachycardic to 120's, otherwise regular.   Respiratory: Symmetric air movement. Breathing unlabored.  MS: No edema; No deformity. Abdomen: Obese, soft. No grimace on palpation Neuro: Opens eyes to name. Does not FC.   Labs    Chemistry Recent Labs  Lab 11/13/17 0541 11/15/17 0457 11/07/2017 1019 11/17/17 0411  NA 141 148* 155* 152*  K 3.6 4.4 4.0 4.1  CL 110 111 125* 123*  CO2 20* 22 21* 22  GLUCOSE 98 172* 139* 192*  BUN 61* 74* 92* 58*  CREATININE 0.66 0.84 1.15* 0.87  CALCIUM 8.6* 9.4 8.2* 8.7*  PROT 6.2* 7.6 5.7*  --   ALBUMIN 2.2* 2.9* 2.3*  --   AST 63* 95* 110*  --   ALT 75* 125* 192*  --   ALKPHOS 107 228* 449*  --   BILITOT 0.5 1.0 0.9  --   GFRNONAA >60 >60 54* >60  GFRAA >60 >60 >60 >60  ANIONGAP 11 15 9 7      Hematology Recent Labs  Lab 11/15/17 0457 11/07/2017 1019 11/17/17 0411  WBC 27.5* 26.0* 25.9*  RBC 5.44* 4.89 4.61  HGB 16.6* 15.2* 13.9  HCT 51.2* 47.1* 44.6  MCV 94.1 96.3 96.7  MCH 30.5 31.1 30.2  MCHC 32.4 32.3 31.2  RDW 14.9 15.5 15.8*  PLT 113* 67* 54*    Cardiac Enzymes Recent Labs  Lab 11/15/2017 0429  TROPONINI 0.81*   No results for input(s): TROPIPOC in the last 168 hours.   BNPNo results for input(s): BNP, PROBNP in the last 168 hours.   DDimer No results for input(s): DDIMER in the last  168 hours.   Radiology    Dg Chest 1 View  Result Date: 11/15/2017 CLINICAL DATA:  Nasogastric tube placement. EXAM: CHEST 1 VIEW COMPARISON:  11/13/2017 FINDINGS: Nasal/orogastric tube passes below the diaphragm well into the stomach, below the included field of view. Endotracheal tube noted on the prior study is no longer visualized, presumably removed. There is opacity at the right lung base silhouetting the right hemidiaphragm, increased from the prior study. This is likely atelectasis. Pneumonia is possible. Remainder of the lungs is clear. IMPRESSION: 1. Well-positioned nasal/orogastric tube. 2. Increased opacity at the right lung base consistent with atelectasis, pneumonia or a combination. Electronically Signed   By: Lajean Manes M.D.   On: 11/15/2017 13:13   Ct Head Wo Contrast  Result Date: 11/02/2017 CLINICAL DATA:  Dilated pupils.  Transfer from and other hospital. EXAM: CT HEAD WITHOUT CONTRAST TECHNIQUE: Contiguous axial images were obtained from the base of the skull through the vertex without intravenous contrast. COMPARISON:  11/14/2017 FINDINGS: Brain: No evidence of acute infarction, hemorrhage, hydrocephalus, extra-axial collection or mass lesion/mass effect. Vascular: No hyperdense vessel or unexpected calcification. Skull: Choose 1 Sinuses/Orbits: Globes and orbits are unremarkable. Mucous retention cyst with some dependent secretions in the left maxillary sinus. Remaining visualized sinuses and mastoid air cells are clear. Other: None. IMPRESSION: 1. No intracranial abnormality. No change from the recent prior exam. Electronically Signed   By: Lajean Manes M.D.   On: 10/24/2017 10:15   Dg Chest Portable 1 View  Result Date: 11/01/2017 CLINICAL DATA:  Followup endotracheal placement EXAM: PORTABLE CHEST 1 VIEW COMPARISON:  11/15/2017 FINDINGS: Endotracheal tube tip is 2 cm above the carina. Right arm PICC tip is in the SVC 3 cm above the right atrium. Nasogastric tube enters the  abdomen with the tip in the gastric fundus. Pacemaker appears the same. Upper lungs are clear. Areas of atelectasis/infiltrate in both lower lobes persist, perhaps slightly improved following intubation. IMPRESSION: Endotracheal tube well position. Persistent basilar volume loss/infiltrate, probably slightly improved following intubation. Electronically Signed   By: Nelson Chimes M.D.   On: 11/04/2017 06:35   US Abdomen Limited Ruq  Result Date: 10/28/2017 CLINICAL DATA:  Prior cholecystectomy. Transaminitis. Elevated liver function tests. EXAM: ULTRASOUND ABDOMEN LIMITED RIGHT UPPER QUADRANT COMPARISON:  03/25/2017 FINDINGS: Gallbladder: Surgically absent. Common bile duct: Diameter: 5 mm, within normal limits. Liver: Mildly increased echogenicity of the hepatic parenchyma, consistent with diffuse hepatocellular disease. No focal mass lesion identified. Portal vein  is patent on color Doppler imaging with normal direction of blood flow towards the liver. IMPRESSION: Prior cholecystectomy.  No evidence of biliary ductal dilatation. Mild diffuse hepatocellular disease. No focal liver lesion identified. Consider liver elastography ultrasound to evaluate for hepatic fibrosis/cirrhosis. Electronically Signed   By: Earle Gell M.D.   On: 10/28/2017 17:53    Cardiac Studies  LHC 10/25/2017 Conclusion    Dist RCA-1 lesion is 70% stenosed.  Dist RCA-2 lesion is 75% stenosed with 75% stenosed side branch in Ost RPDA.  Post Atrio lesion is 90% stenosed with non-stenotic side branch in 1st RPLB.  Ost LM lesion is 30% stenosed.  Prox Cx lesion is 60% stenosed.  Ost LAD to Prox LAD lesion is 80% stenosed.  Ost 1st Diag to 1st Diag lesion is 80% stenosed.  Mid LAD to Dist LAD lesion is 70% stenosed.  LV end diastolic pressure is normal.   1. Moderate 3 vessel CAD but no acute occlusion 2. Low LV filling pressures 3. Normal right heart pressures.  4. Mildly reduce cardiac output.   Plan: While she  does have coronary artery disease it is not enough to explain drop in EF which appears to be more consistent with stress induced cardiomyopathy. Current hypotension and tachycardia are related to hypovolemia with low filling pressures. Will continue antiplatelet therapy. Hold Coreg in setting of hypotension. Volume repletion. Further management per CCM.     TTE 11/04/17:  Study Conclusions - Left ventricle: The cavity size was normal. Wall thickness was   normal. Systolic function was moderately to severely reduced. The   estimated ejection fraction was = 30%. - Regional wall motion abnormality: Akinesis of the mid-apical   anterior, mid anteroseptal, apical inferior, apical septal,   apical lateral, and apical myocardium. - Aortic valve: Mildly calcified annulus. Trileaflet; normal   thickness leaflets. Valve area (VTI): 2.98 cm^2. Valve area   (Vmax): 2.92 cm^2. Valve area (Vmean): 2.61 cm^2. - Mitral valve: Mildly calcified annulus. Normal thickness leaflets   . - Tricuspid valve: There was mild-moderate regurgitation. - Technically difficult study. Echcontrast used to Scientist, forensic.  Impressions:  - Acute decrease in LVEF and new wall motion abnormalities since   January 23,2019 study. Pattern of wall motion abnormalities   typical of stress induced cardiomyopathy/Takotsubo syndrome,   however this diagnosis requires exclusing of coronary artery   disease.  TTE 11/17/17: Pending  Patient Profile     52 y.o.chronically-ill female w/ sinus arrhythmia s/p SN modification and PPM implantation, COPD with chronic respiratory failure on 2L O2 at baseline, Chron Disease on chronic anti-TNF therapy, HTN and DM2 initially admitted 2/11 at [facility] w/ COPD exacerbation secondary to influenza resulting in respiratory failure requiring intubation. She had troponin elevation to 1.68 with reduced EF to 30%, felt to be secondary to stress cardiomyopathy. This was treated medically with  ASA, plavix, lasix and beta blockers. Hospital course otherwise complicated by Sepsis and DKA (resolved).  Prior to transfer to [facility], she was extubated and unresponsive despite no sedation. She became increasingly tachycardic and EKG 2/23 with inferior STEMI; CODE STEMI activated and transferred to [facility] for emergent cath. Moderate 3-vessel CAD appreciated but no acute occlusion.  She remains intubated, encephalopathic and febrile.   Assessment & Plan    CAD, HFrEF, ?Stress Cardiomyopathy: LHC with moderate 3 vessel CAD however no acute occlusion. TTE 2/12 with LVEF 30% and diffuse apical akinesis. Repeat TTE pending. Extent of disease does not seem to explain acute drop  in EF, appears more consistent with stress-induced cardiomyopathy. On Coreg, ASA, Plavix, Lipitor. Use of ACE-I and BB titration limited by hypotension.  -Follow TTE -Judicious fluid replacement given low EF -Continue ASA, Plavix, Lipitor, Coreg  ?Sepsis, Purulent Decub Ulcer, RLL PNA, Recent Flu: Still with leukocytosis to 25,000 and febrile. On Vancomycin and Meropenem. Per ccm.  Acute on Chronic Respiratory Failure: Secondary to above, per PCCM  Encephalopathy: CT head negative. Ammonia slightly elevated, lactulose per primary.   DISPOSITION: Palliative care consulted for [place] discussion. Family meeting scheduled 2/2 at 3:30pm. Prognosis guarded.   For questions or updates, please contact [place] Please consult [email or web address] for contact info under Cardiology/STEMI.   Signed, Bethany Molt, DO  11/17/2017, 8:14 AM    I have examined the patient and reviewed assessment and plan and discussed with patient.  Agree with above as stated.  Disease on cath did not explain low EF.  Will follow.   Larae Grooms

## 2017-11-17 NOTE — Progress Notes (Signed)
Cortrak Tube Team Note:  Consult received to place a Cortrak feeding tube.   A 10 F Cortrak tube was placed in the R nare and secured with a nasal bridle at 81 cm. Per the Cortrak monitor reading the tube tip is post pyloric.   No x-ray is required. RD ordered abdominal xray due to tracing.  If the tube becomes dislodged please keep the tube and contact the Cortrak team at [email or web address] (password [email or web address]) for replacement.  If after hours and replacement cannot be delayed, place a NG tube and confirm placement with an abdominal x-ray.    Earma Reading, MS, RD, LDN Pager: [phone] Weekend/After Hours: [phone]

## 2017-11-17 NOTE — Consult Note (Addendum)
WOC Nurse wound consult note Reason for Consult: Consult requested for sacrum and right heel. Wound type: Right heel with dark purple red intact fluid filled blister, Deep tissue pressure injury; 4X3cm Sacrum with previous deep tissue injury, evolving into unstageable.  Total affected area is approx 5X7cm, 50% beginning to peel and have pink moist partial thickness skin loss, surrounding the center wound, which is 3X2cm: 25% dark reddish purple deep tissue injury, 25% unstageable.  Mod amt tan drainage, no odor.  There is a linear deep tissue injury near the top of the site; 5X.2cm, dark red-purple. Pressure Injury POA: Yes; on admission to [facility] Dressing procedure/placement/frequency: Pt is on an air mattress to reduce pressure and has Prevalon boots to reduce pressure to BLE.  Foam dressing in place to protect right heel from further injury, and Santyl ointment ordered for enzymatic debridement of nonviable tissue to sacrum unstageable pressure injury.  Pt is critically ill with multiple systemic factors which can impair healing.  No family members present to discuss plan of care. Please re-consult if further assistance is needed.  Thank-you,  Cammie Mcgee MSN, RN, CWOCN, [place], CNS [phone]

## 2017-11-17 NOTE — Progress Notes (Signed)
Nutrition Follow-up  DOCUMENTATION CODES:   Non-severe (moderate) malnutrition in context of acute illness/injury  INTERVENTION:   D/C Vital High Protein  Vital AF 1.2 @ 60 ml/hr (1440 ml/day) 30 ml Prostat BID  Provides: 1928 kcal, 138 grams protein, and 1167 ml free water.    NUTRITION DIAGNOSIS:   Moderate Malnutrition related to acute illness as evidenced by moderate fat depletion, mild muscle depletion, moderate muscle depletion, edema, energy intake < 75% for > 7 days. Ongoing.   GOAL:   Provide needs based on ASPEN/SCCM guidelines Progressing.   MONITOR:   I & O's, TF tolerance, Skin  REASON FOR ASSESSMENT:   Consult Enteral/tube feeding initiation and management  ASSESSMENT:   Pt with PMH of Crohn's (on anti-TNF agent), GERD, Depression/anxiety, smoker, COPD on home O2 (she had been off O2 due to insurance issues), CVA, DM, PTSD, CHF, OSA, HTN admitted 2/11 with abd pain, emesis and diarrhea for several days PTA found to be influenza A positive.    2/12 intubated 2/21 extubated but remained encephalopathic 2/23 18 F NG tube (only chest xray obtained which shows tube passes below diaphragm) 2/24 tx to [facility] due to diffuse ST segment elevations s/p cath without intervention, EF 30% Pt required re-intubation  2/25 MD started pt on trophic feeds, Cortrak tube placed (tip in stomach)  Admission weight: 200 lb  No hx of CHF reported but is maintained on 40 mg lasix each morning  Pt is currently 188 lb and is negative 1.6 L since admission. Weight has continued to trend down Pt has been hospitalized x 2 weeks she now is febrile has RLL infiltrate per xray and a new decubitus ulcer (2/21 - DTI R heel, 2/24 - unstageable buttocks, blister on sacrum, MASD) Per previous RD notes pt on low rate TF/prostat and propofol during intubation at [facility].  Pt has had inadequate nutrition support x 5 days, 2/21-2/25  Medications reviewed and include: lactulose 30 grams TID,  solu-medrol, 10 mEq KCl daily, SSI, 1/2 NS @ 75 ml/hr Labs reviewed: Na 152 (H), BUN 58 (H), magnesium 2.5 (H) CBG's: [phone] UOP: 1325 ml x 24 hr MAP 80 Nutrition needs re-estimated due to wounds and moderate malnutrition   NUTRITION - FOCUSED PHYSICAL EXAM:    Most Recent Value  Orbital Region  Moderate depletion  Upper Arm Region  No depletion  Thoracic and Lumbar Region  No depletion  Buccal Region  Unable to assess  Temple Region  Mild depletion  Clavicle Bone Region  No depletion  Clavicle and Acromion Bone Region  No depletion  Scapular Bone Region  Unable to assess  Dorsal Hand  No depletion  Patellar Region  Mild depletion  Anterior Thigh Region  Moderate depletion  Posterior Calf Region  Unable to assess  Edema (RD Assessment)  Mild  Hair  Reviewed  Eyes  Reviewed  Mouth  Unable to assess  Skin  Reviewed  Nails  Reviewed       Diet Order:  Diet NPO time specified  EDUCATION NEEDS:   No education needs have been identified at this time  Skin:  Skin Assessment: Reviewed RN Assessment Skin Integrity Issues:: DTI, Unstageable(blister on sacrum, MASD) DTI: R heel 2/21 Unstageable: buttocks 2/24  Last BM:  2/21  Height:   Ht Readings from Last 1 Encounters:  11-22-2017 5\' 4"  (1.626 m)    Weight:   Wt Readings from Last 1 Encounters:  11/17/17 188 lb 7.9 oz (85.5 kg)    Ideal Body  Weight:  54.54 kg  BMI:  Body mass index is 32.35 kg/m.  Estimated Nutritional Needs:   Kcal:  1925  Protein:  126-153 grams  Fluid:  > 1.5 L/day  Kendell Bane RD, LDN, CNSC [phone] Pager [phone] After Hours Pager

## 2017-11-17 NOTE — Plan of Care (Signed)
  Progressing Nutrition: Adequate nutrition will be maintained 11/17/2017 2020 - Progressing by [place] Comment, RN Note Core Track placed in patient today and tube feeds started.  Pain Managment: General experience of comfort will improve 11/17/2017 2020 - Progressing by [place] Comment, RN Note Pt does not appear to be in pain. Pts responds to PRN pain medication well.

## 2017-11-17 NOTE — Progress Notes (Signed)
eLink Physician-Brief Progress Note Patient Name: [name] DOB: [birth year] MRN: [id number]   Date of Service  11/17/2017  HPI/Events of Note  Ammonia level = 59.   eICU Interventions  Will order: 1. Lactulose 30 gm via tube TID. 2. Ammonia level in AM 11/18/2017.     Intervention Category Major Interventions: Change in mental status - evaluation and management  [name] 11/17/2017, 2:18 AM

## 2017-11-17 NOTE — Consult Note (Signed)
NEURO HOSPITALIST CONSULT NOTE   Requestig physician: Dr. Pearline Cables   Reason for Consult: Altered mental status and not moving extremities   History obtained from: Chart  HPI:                                                                                                                                          [name] is an 52 y.o. female with history of sinus tachycardia status post sinus node modification and PPM implantation, COPD, hypertension who was admitted to [facility]  for COPD exacerbation and influenza requiring hospitalization and intubation. 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 Patient was transferred to [facility] secondary to ECG changes concerning for STEMI.   Per note patient was extubated over [facility] and remained encephalopathic.  Her hospital course any pen was complicated by sepsis, DKA, and elevated transaminases.  As stated above patient was is for the most [facility] secondary to ECG changes.  At that time she was on BiPAP.  On arrival to [facility] patient unfortunately had to be reintubated and then transferred up to Cath Lab.  Cath Lab, where she was found to have diffuse disease not requiring intervention.  Of note her LVEDP was normal at 11.  She was transferred to the intensive care unit where she is unresponsive despite receiving no sedative agents. She has a complex past medical history and it appears that she receives an anti-TNF agent for Crohn's disease.  At this point palliative care has been consulted however they have not had a meeting as of yet.     Medications patient has been on include: Methylprednisolone-this was DC'd today Meropenem Propofol however is not received any propofol today or yesterday Patient received 1 dose of fentanyl today at 1:00    Past Medical History:  Diagnosis Date  . Anxiety   . Arthritis   . Cardiac pacemaker in situ 2009   DDD  [employer] -- [place] 60  . COPD with asthma ([place])    GOLD 2-3 --  pulmologist (last visit 2011) Dr. Chase Caller  . Crohn's disease ([place])    Large intestine  . Depression 2016   PTSD  . Essential hypertension   . Family history of adverse reaction to anesthesia    father- stop breathing surgery   . Gastroparesis   . GERD (gastroesophageal reflux disease)   . History of hiatal hernia   . History of kidney stones   . History of stroke    Jun 2011 -- right hand weakness  . History of syncope   . Inappropriate sinus tachycardia    Sinus node modification 02-25-2003 by Dr. Cristopher Peru  . OSA (obstructive sleep apnea)  Study done 2005 -- pt refused CPAP/previously was using nocturnal oxygen until one year ago pt states PCP is monitoring pt without  . Pelvic pain in female   . PTSD (post-traumatic stress disorder)   . Sinus node dysfunction (HCC)    Symptomatic bradycardia  . Type 2 diabetes mellitus ([place])   . Wears glasses     Past Surgical History:  Procedure Laterality Date  . [place] STUDY N/A 12/30/2016   Procedure: [place] STUDY;  Surgeon: Manus Gunning, MD;  Location: [facility];  Service: Gastroenterology;  Laterality: N/A;  . ABDOMINAL HYSTERECTOMY    . BILATERAL KNEE ARTHROSCOPY W/ CHONDROMALACIA PATELLA  bilateral ---- 12-26-2010; 08-14-2009;  04-25-2008;  03-09-2007   additional same surgery, Left knee 2005;  x2 2006 ---  Right knee 2005;  2006;  x2  2007  . CARDIAC CATHETERIZATION  05-08-2001  dr Shelva Majestic   normal coronary arteries and LVF  . CARDIAC ELECTROPHYSIOLOGY STUDY W/  SINUS NODE MODIFICATION  02-25-2003  dr Carleene Overlie taylor  . CARDIAC PACEMAKER PLACEMENT  10-16-2007  dr Cristopher Peru   DDD-- [facility] 60  . CARDIOVASCULAR STRESS TEST  08-02-2010  dr Domenic Polite   normal lexiscan study/  ef 59%  . CESAREAN SECTION  x2  . CHOLECYSTECTOMY  1994  . COLONOSCOPY N/A 05/23/2017   Procedure: COLONOSCOPY;  Surgeon: Yetta Flock, MD;  Location: [facility];  Service: Gastroenterology;  Laterality: N/A;  . COLONOSCOPY WITH PROPOFOL N/A 09/27/2016   Procedure: COLONOSCOPY WITH PROPOFOL;  Surgeon: Manus Gunning, MD;  Location: [facility];  Service: Gastroenterology;  Laterality: N/A;  . CYSTO/  URETEROSCOPIC STONE EXTRACTION  03/ 2005  . CYSTOSCOPY WITH HYDRODISTENSION AND BIOPSY N/A 05/23/2015   Procedure: CYSTOSCOPY/BIOPSY/HYDRODISTENSION;  Surgeon: Lowella Bandy, MD;  Location: [facility];  Service: Urology;  Laterality: N/A;  . ESOPHAGEAL MANOMETRY N/A 12/30/2016   Procedure: ESOPHAGEAL MANOMETRY (EM);  Surgeon: Manus Gunning, MD;  [facility]: [facility];  Service: Gastroenterology;  Laterality: N/A;  . ESOPHAGOGASTRODUODENOSCOPY (EGD) WITH PROPOFOL N/A 09/27/2016   Procedure: ESOPHAGOGASTRODUODENOSCOPY (EGD) WITH PROPOFOL;  Surgeon: Manus Gunning, MD;  [facility];  Service: Gastroenterology;  Laterality: N/A;  . ESOPHAGOGASTRODUODENOSCOPY (EGD) WITH PROPOFOL N/A 05/23/2017   Procedure: ESOPHAGOGASTRODUODENOSCOPY (EGD) WITH PROPOFOL;  Surgeon: Yetta Flock, MD;  [facility]: [facility];  Service: Gastroenterology;  Laterality: N/A;  . MULTIPLE EXTRACTIONS WITH ALVEOLOPLASTY    . RIGHT/LEFT HEART CATH AND CORONARY ANGIOGRAPHY N/A 11/15/2017   Procedure: RIGHT/LEFT HEART CATH AND CORONARY ANGIOGRAPHY;  Surgeon: Martinique, Peter M, MD;  Location: [facility];  Service: Cardiovascular;  Laterality: N/A;  . SAVORY DILATION N/A 05/23/2017   Procedure: SAVORY DILATION;  Surgeon: Yetta Flock, MD;  Location: [facility];  Service: Gastroenterology;  Laterality: N/A;  . TONSILLECTOMY  11-25-2002    Family History  Problem Relation Age of Onset  . Diabetes Mother   . Asthma Mother   . Heart disease Mother   . Cirrhosis Mother   . Stroke Mother   . Depression Mother   . Heart disease Father        Deceased. MI. Mother, 2 brothers, sister, nephew also  have heart disease  . Emphysema Father        Died of it. Was pt of Dr. Melvyn Novas   . Heart attack Father   . Cancer Father        ? type  . Diabetes Brother   .  Heart disease Brother   . Diabetes Sister   . Cirrhosis Sister   . Cirrhosis Maternal Grandmother   . Diabetes Brother   . Heart attack Brother   . Heart disease Brother   . Seizures Brother   . Depression Brother   . Colon cancer Neg Hx   . Stomach cancer Neg Hx   . Esophageal cancer Neg Hx   . Rectal cancer Neg Hx   . Liver cancer Neg Hx      Social History:  reports that she has been smoking cigarettes.  She started smoking about 44 years ago. She has a 84.00 pack-year smoking history. she has never used smokeless tobacco. She reports that she does not drink alcohol or use drugs.  Allergies  Allergen Reactions  . Flexeril [Cyclobenzaprine] Hives  . Amoxicillin Hives and Rash    Has patient had a PCN reaction causing immediate rash, facial/tongue/throat swelling, SOB or lightheadedness with hypotension: Yes Has patient had a PCN reaction causing severe rash involving mucus membranes or skin necrosis: Yes Has patient had a PCN reaction that required hospitalization No Has patient had a PCN reaction occurring within the last 10 years: Yes If all of the above answers are "NO", then may proceed with Cephalosporin use.     MEDICATIONS:                                                                                                                     Scheduled: . aspirin  81 mg Per Tube Daily  . atorvastatin  80 mg Oral q1800  . budesonide (PULMICORT) nebulizer solution  0.5 mg Nebulization BID  . chlorhexidine gluconate (MEDLINE KIT)  15 mL Mouth Rinse BID  . Chlorhexidine Gluconate Cloth  6 each Topical Daily  . clopidogrel  75 mg Oral Daily  . collagenase   Topical Daily  . feeding supplement (PRO-STAT SUGAR FREE 64)  30 mL Per Tube BID  . heparin  5,000 Units Subcutaneous Q8H  . insulin aspart  0-20 Units  Subcutaneous Q4H  . ipratropium  0.5 mg Nebulization Q6H  . lactulose  30 g Per Tube TID  . levalbuterol  0.63 mg Nebulization Q6H  . mouth rinse  15 mL Mouth Rinse 10 times per day  . [START ON 11/18/2017] methylPREDNISolone (SOLU-MEDROL) injection  40 mg Intravenous Q24H  . pantoprazole sodium  40 mg Per Tube QHS  . potassium chloride  10 mEq Oral Daily  . sodium chloride flush  10-40 mL Intracatheter Q12H  . sodium chloride flush  3 mL Intravenous Q12H     ROS:  History obtained from unobtainable from patient due to mental status     Blood pressure 91/65, pulse (!) 109, temperature (!) 100.6 F (38.1 C), temperature source Esophageal, resp. rate (!) 21, height 5' 4"  (1.626 m), weight 85.5 kg (188 lb 7.9 oz), SpO2 95 %.   General Examination:                                                                                                       Physical Exam  HEENT-  Normocephalic, no lesions, without obvious abnormality.  Normal external eye and conjunctiva.   Cardiovascular- S1-S2 audible, pulses palpable throughout   Lungs-no rhonchi or wheezing noted, no excessive working breathing.  Saturations within normal limits Abdomen- All 4 quadrants palpated and nontender Extremities- Warm, dry and intact Musculoskeletal-no joint tenderness, deformity or swelling Skin-warm and dry, no hyperpigmentation, vitiligo, or suspicious lesions  Neurological Examination Mental Status: Patient does respond to verbal stimuli--opens eyes.  Does respond to deep sternal rub.  Does not follow commands.  No verbalizations noted.  Cranial Nerves: II: patient  respond by opening eyes no blink confrontation bilaterally and at times tracks, pupils right 4 mm, left 4 mm,and reactive bilaterally III,IV,VI: doll's response present bilaterally.  V,VII: corneal reflex present  bilaterally  VIII: patient does not respond to verbal stimuli IX,X: gag reflex present, XI: trapezius strength unable to test bilaterally XII: tongue strength unable to test Motor: Extremities flaccid throughout.  No spontaneous movement noted.  No purposeful movements noted. Sensory: Does not respond to noxious stimuli in any extremity. Deep Tendon Reflexes:  Absent throughout. Plantars: mute Cerebellar: Unable to perform     Lab Results: Basic Metabolic Panel: Recent Labs  Lab 11/12/17 0556 11/13/17 0541 11/15/17 0457 10/25/2017 1019 11/17/17 0411  NA 143 141 148* 155* 152*  K 3.8 3.6 4.4 4.0 4.1  CL 116* 110 111 125* 123*  CO2 17* 20* 22 21* 22  GLUCOSE 65 98 172* 139* 192*  BUN 58* 61* 74* 92* 58*  CREATININE 0.71 0.66 0.84 1.15* 0.87  CALCIUM 8.4* 8.6* 9.4 8.2* 8.7*  MG  --   --   --   --  2.5*  PHOS  --   --   --   --  3.2    CBC: Recent Labs  Lab 11/12/17 0556 11/13/17 0541 11/15/17 0457 11/07/2017 1019 11/17/17 0411  WBC 22.7* 23.5* 27.5* 26.0* 25.9*  NEUTROABS 19.5*  --   --  20.8*  --   HGB 12.4 13.4 16.6* 15.2* 13.9  HCT 38.5 41.7 51.2* 47.1* 44.6  MCV 94.1 92.7 94.1 96.3 96.7  PLT 173 152 113* 67* 54*    Cardiac Enzymes: Recent Labs  Lab 11/17/2017 0429  TROPONINI 0.81*    Lipid Panel: Recent Labs  Lab 11/11/17 0437 11/13/17 0541  TRIG 329* 286*    Imaging: Ct Head Wo Contrast  Result Date: 11/13/2017 CLINICAL DATA:  Dilated pupils.  Transfer from and other hospital. EXAM: CT HEAD WITHOUT CONTRAST TECHNIQUE: Contiguous axial images were obtained from the base of the skull through the vertex  without intravenous contrast. COMPARISON:  11/14/2017 FINDINGS: Brain: No evidence of acute infarction, hemorrhage, hydrocephalus, extra-axial collection or mass lesion/mass effect. Vascular: No hyperdense vessel or unexpected calcification. Skull: Choose 1 Sinuses/Orbits: Globes and orbits are unremarkable. Mucous retention cyst with some dependent  secretions in the left maxillary sinus. Remaining visualized sinuses and mastoid air cells are clear. Other: None. IMPRESSION: 1. No intracranial abnormality. No change from the recent prior exam. Electronically Signed   By: Lajean Manes M.D.   On: 11/07/2017 10:15   Dg Chest Portable 1 View  Result Date: 11/01/2017 CLINICAL DATA:  Followup endotracheal placement EXAM: PORTABLE CHEST 1 VIEW COMPARISON:  11/15/2017 FINDINGS: Endotracheal tube tip is 2 cm above the carina. Right arm PICC tip is in the SVC 3 cm above the right atrium. Nasogastric tube enters the abdomen with the tip in the gastric fundus. Pacemaker appears the same. Upper lungs are clear. Areas of atelectasis/infiltrate in both lower lobes persist, perhaps slightly improved following intubation. IMPRESSION: Endotracheal tube well position. Persistent basilar volume loss/infiltrate, probably slightly improved following intubation. Electronically Signed   By: Nelson Chimes M.D.   On: 11/13/2017 06:35   Dg Abd Portable 1v  Result Date: 11/17/2017 CLINICAL DATA:  Feeding tube placement EXAM: PORTABLE ABDOMEN - 1 VIEW COMPARISON:  CT abdomen and pelvis September 04, 2017 FINDINGS: Feeding tube tip in distal stomach. Bowel gas pattern normal. No free air or portal venous air. IMPRESSION: Feeding tube tip in distal stomach. Bowel gas pattern unremarkable. No bowel obstruction or free air evident. Electronically Signed   By: Lowella Grip III M.D.   On: 11/17/2017 12:31   US Abdomen Limited Ruq  Result Date: 10/28/2017 CLINICAL DATA:  Prior cholecystectomy. Transaminitis. Elevated liver function tests. EXAM: ULTRASOUND ABDOMEN LIMITED RIGHT UPPER QUADRANT COMPARISON:  03/25/2017 FINDINGS: Gallbladder: Surgically absent. Common bile duct: Diameter: 5 mm, within normal limits. Liver: Mildly increased echogenicity of the hepatic parenchyma, consistent with diffuse hepatocellular disease. No focal mass lesion identified. Portal vein is patent on  color Doppler imaging with normal direction of blood flow towards the liver. IMPRESSION: Prior cholecystectomy.  No evidence of biliary ductal dilatation. Mild diffuse hepatocellular disease. No focal liver lesion identified. Consider liver elastography ultrasound to evaluate for hepatic fibrosis/cirrhosis. Electronically Signed   By: Earle Gell M.D.   On: 10/26/2017 17:53    Assessment and plan per attending neurologist  Etta Quill PA-C [facility] [phone]  11/17/2017, 2:21 PM   NEUROHOSPITALIST ADDENDUM Seen and examined the patient today. Formulated plan as documented above by PAC/Resident. I agree with recommendations as below  Will follow.   Assessment/Plan: [name] is an 52 y.o. female with history of sinus tachycardia status post sinus node modification and PPM implantation, COPD, hypertension who was admitted to [facility]  for COPD exacerbation and influenza requiring hospitalization and intubation. 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 Neurology consulted as pt continues to remain encephalopathic and not moving all 4 extremities.    D/D include CVA, Spinal infarction, AIDP, critical illness myopathy,metabolic and toxic encephalopathy  Plan Stat CTA head and neck  - r/o basilar occlusion Repeat CT head showed no evolving infarcts Creatinine kinase levels Unfortunately due to pacemaker cannot obtain MRI brain and C spine.  Will consider obtaining LP to check for proteinemia, [place] virus   Karena Addison Aroor MD [facility] [phone]  If 7pm to 7am, please  call on call as listed on AMION.

## 2017-11-17 NOTE — Progress Notes (Signed)
eLink Physician-Brief Progress Note Patient Name: [name] DOB: 1966/01/11 MRN: [id number]   Date of Service  11/17/2017  HPI/Events of Note  Watery stool - Request for Flexiseal.   eICU Interventions  Will order Flexiseal.      Intervention Category Major Interventions: Other:  [name] 11/17/2017, 11:06 PM

## 2017-11-17 NOTE — Progress Notes (Signed)
Patient ID: [name], female   DOB: 1966-09-22, 52 y.o.   MRN: [id number] Thank you for consulting the Palliative Medicine Team at [facility] to meet your patient's and family's needs.   The reason that you asked [place] to see your patient is  to establish goals of care  We have scheduled your patient for a meeting: Tuesday 11-18-17 at 3:30   Other family members that need to be present: Iantha Fallen NP  Palliative Medicine Team Team Phone # [phone] Pager [phone]  No charge

## 2017-11-17 NOTE — Care Management Note (Signed)
Case Management Note Donn Pierini RN, BSN Unit 4E-Case Manager-- 2H coverage [phone]  Patient Details  Name: [name] MRN: [id number] Date of Birth: 10/22/1965  Subjective/Objective:  Pt tx from [facility]- with respiratory failure- required intubation- vent management                   Action/Plan: PTA pt lived at home, hx- COPD home 02, CM to follow for transition of care needs-   Expected Discharge Date:  11/07/17               Expected Discharge Plan:     In-House Referral:     Discharge planning Services  CM Consult  Post Acute Care Choice:    Choice offered to:     DME Arranged:    DME Agency:     HH Arranged:    HH Agency:     Status of Service:  In process, will continue to follow  If discussed at Long Length of Stay Meetings, dates discussed:    Discharge Disposition:   Additional Comments:  Darrold Span, RN 11/17/2017, 10:40 AM

## 2017-11-17 NOTE — Progress Notes (Signed)
Dr. Vassie Loll would like to keep left femoral arterial line in place until tomorrow.   Leanna Battles, RN

## 2017-11-18 ENCOUNTER — Ambulatory Visit (HOSPITAL_COMMUNITY): Payer: Medicaid Other | Admitting: Psychiatry

## 2017-11-18 ENCOUNTER — Inpatient Hospital Stay (HOSPITAL_COMMUNITY): Payer: Medicaid Other

## 2017-11-18 DIAGNOSIS — G8250 Quadriplegia, unspecified: Secondary | ICD-10-CM

## 2017-11-18 DIAGNOSIS — Z66 Do not resuscitate: Secondary | ICD-10-CM

## 2017-11-18 DIAGNOSIS — G9340 Encephalopathy, unspecified: Secondary | ICD-10-CM

## 2017-11-18 LAB — GLUCOSE, CAPILLARY
GLUCOSE-CAPILLARY: 441 mg/dL — AB (ref 65–99)
GLUCOSE-CAPILLARY: 376 mg/dL — AB (ref 65–99)
GLUCOSE-CAPILLARY: 293 mg/dL — AB (ref 65–99)
Glucose-Capillary: 125 mg/dL — ABNORMAL HIGH (ref 65–99)
Glucose-Capillary: 131 mg/dL — ABNORMAL HIGH (ref 65–99)
Glucose-Capillary: 256 mg/dL — ABNORMAL HIGH (ref 65–99)
Glucose-Capillary: 286 mg/dL — ABNORMAL HIGH (ref 65–99)
Glucose-Capillary: 321 mg/dL — ABNORMAL HIGH (ref 65–99)
Glucose-Capillary: 372 mg/dL — ABNORMAL HIGH (ref 65–99)
Glucose-Capillary: 392 mg/dL — ABNORMAL HIGH (ref 65–99)
Glucose-Capillary: 436 mg/dL — ABNORMAL HIGH (ref 65–99)
Glucose-Capillary: 487 mg/dL — ABNORMAL HIGH (ref 65–99)

## 2017-11-18 LAB — AMMONIA: AMMONIA: 30 umol/L (ref 9–35)

## 2017-11-18 LAB — COMPREHENSIVE METABOLIC PANEL
ALBUMIN: 2 g/dL — AB (ref 3.5–5.0)
ALK PHOS: 300 U/L — AB (ref 38–126)
ALT: 103 U/L — AB (ref 14–54)
ANION GAP: 8 (ref 5–15)
AST: 52 U/L — AB (ref 15–41)
BILIRUBIN TOTAL: 0.8 mg/dL (ref 0.3–1.2)
BUN: 46 mg/dL — AB (ref 6–20)
CALCIUM: 8.1 mg/dL — AB (ref 8.9–10.3)
CO2: 20 mmol/L — ABNORMAL LOW (ref 22–32)
CREATININE: 0.74 mg/dL (ref 0.44–1.00)
Chloride: 126 mmol/L — ABNORMAL HIGH (ref 101–111)
GFR calc Af Amer: >60 mL/min (ref >60)
GFR calc non Af Amer: >60 mL/min (ref >60)
GLUCOSE: 328 mg/dL — AB (ref 65–99)
Potassium: 3 mmol/L — ABNORMAL LOW (ref 3.5–5.1)
Sodium: 154 mmol/L — ABNORMAL HIGH (ref 135–145)
TOTAL PROTEIN: 4.6 g/dL — AB (ref 6.5–8.1)

## 2017-11-18 LAB — CBC
HEMATOCRIT: 36.1 % (ref 36.0–46.0)
Hemoglobin: 11.3 g/dL — ABNORMAL LOW (ref 12.0–15.0)
MCH: 30.4 pg (ref 26.0–34.0)
MCHC: 31.3 g/dL (ref 30.0–36.0)
MCV: 97 fL (ref 78.0–100.0)
Platelets: 41 10*3/uL — ABNORMAL LOW (ref 150–400)
RBC: 3.72 MIL/uL — ABNORMAL LOW (ref 3.87–5.11)
RDW: 15.8 % — AB (ref 11.5–15.5)
WBC: 24.1 10*3/uL — ABNORMAL HIGH (ref 4.0–10.5)

## 2017-11-18 LAB — VANCOMYCIN, TROUGH: Vancomycin Tr: 16 ug/mL (ref 15–20)

## 2017-11-18 LAB — APTT
APTT: 43 s — AB (ref 24–36)
APTT: 167 s — AB (ref 24–36)
aPTT: 91 seconds — ABNORMAL HIGH (ref 24–36)

## 2017-11-18 LAB — PROCALCITONIN: Procalcitonin: 0.66 ng/mL

## 2017-11-18 LAB — PHOSPHORUS: Phosphorus: 1.3 mg/dL — ABNORMAL LOW (ref 2.5–4.6)

## 2017-11-18 LAB — MAGNESIUM: Magnesium: 2.2 mg/dL (ref 1.7–2.4)

## 2017-11-18 LAB — CK: CK TOTAL: 125 U/L (ref 38–234)

## 2017-11-18 MED ORDER — COLLAGENASE 250 UNIT/GM EX OINT
TOPICAL_OINTMENT | Freq: Every day | CUTANEOUS | Status: DC
  Administered 2017-11-18 – 2017-11-21 (×4): via TOPICAL
  Filled 2017-11-18: qty 30

## 2017-11-18 MED ORDER — CHLORHEXIDINE GLUCONATE 0.12% ORAL RINSE (MEDLINE KIT)
15.0000 mL | Freq: Two times a day (BID) | OROMUCOSAL | Status: DC
  Administered 2017-11-18 – 2017-11-24 (×9): 15 mL via OROMUCOSAL

## 2017-11-18 MED ORDER — INSULIN GLARGINE 100 UNIT/ML ~~LOC~~ SOLN
20.0000 [IU] | Freq: Every day | SUBCUTANEOUS | Status: DC
  Filled 2017-11-18: qty 0.2

## 2017-11-18 MED ORDER — DEXTROSE 5 % IV SOLN
INTRAVENOUS | Status: DC
  Administered 2017-11-18 – 2017-11-19 (×2): via INTRAVENOUS

## 2017-11-18 MED ORDER — POTASSIUM CHLORIDE 20 MEQ/15ML (10%) PO SOLN
40.0000 meq | Freq: Once | ORAL | Status: AC
  Administered 2017-11-18: 40 meq via ORAL
  Filled 2017-11-18: qty 30

## 2017-11-18 MED ORDER — LACTULOSE 10 GM/15ML PO SOLN
20.0000 g | Freq: Two times a day (BID) | ORAL | Status: DC
  Administered 2017-11-18: 20 g
  Filled 2017-11-18 (×2): qty 30

## 2017-11-18 MED ORDER — FREE WATER
200.0000 mL | Status: DC
Start: 2017-11-18 — End: 2017-11-21
  Administered 2017-11-18 – 2017-11-21 (×18): 200 mL

## 2017-11-18 MED ORDER — POTASSIUM & SODIUM PHOSPHATES 280-160-250 MG PO PACK
2.0000 | PACK | Freq: Three times a day (TID) | ORAL | Status: AC
  Administered 2017-11-18 (×3): 2 via ORAL
  Filled 2017-11-18 (×4): qty 2

## 2017-11-18 MED ORDER — SODIUM CHLORIDE 0.9 % IV SOLN
0.1200 mg/kg/h | INTRAVENOUS | Status: DC
  Administered 2017-11-18: 0.15 mg/kg/h via INTRAVENOUS
  Filled 2017-11-18: qty 250

## 2017-11-18 MED ORDER — SODIUM CHLORIDE 0.9 % IV SOLN
INTRAVENOUS | Status: DC
  Administered 2017-11-18: 4.3 [IU]/h via INTRAVENOUS
  Administered 2017-11-19: 3.8 [IU]/h via INTRAVENOUS
  Administered 2017-11-20: 7 [IU]/h via INTRAVENOUS
  Filled 2017-11-18 (×5): qty 1

## 2017-11-18 MED ORDER — FREE WATER
100.0000 mL | Freq: Two times a day (BID) | Status: DC
  Administered 2017-11-18: 100 mL

## 2017-11-18 MED ORDER — POTASSIUM PHOSPHATES 15 MMOLE/5ML IV SOLN
30.0000 mmol | Freq: Once | INTRAVENOUS | Status: AC
  Administered 2017-11-18: 30 mmol via INTRAVENOUS
  Filled 2017-11-18: qty 10

## 2017-11-18 NOTE — Progress Notes (Addendum)
PULMONARY / CRITICAL CARE MEDICINE   Name: [name] MRN: [id number] DOB: [birth year]    ADMISSION DATE:  11-06-17  CHIEF COMPLAINT:  Respiratory failure  HISTORY OF PRESENT ILLNESS:       This is a chronically ill 52 year old who presented to [facility] on 2/11 complaining of dyspnea nausea vomiting and diarrhea.  She was found to be influenza positive and required intubation and mechanical ventilation.  She was intubated until 2/21 and when she was extubated she was encephalopathic.  In addition she had an elevation of her cardiac enzymes and an echocardiogram was obtained showing an ejection fraction of 30% felt to be consistent with a Takotsubo syndrome.  She subsequently developed diffuse ST segment elevation with most pronounced elevation inferiorly were Q waves are present.  She was transferred to this hospital and on arrival required intubation for respiratory distress.  She was taken to the cardiac catheterization laboratory where she was found to have diffuse disease not requiring intervention.  Of note her LVEDP was normal at 11.  She was transferred to the intensive care unit where she is unresponsive despite receiving no sedative agents. She has a complex past medical history and it appears that she receives an anti-TNF agent for Crohn's disease.  She also appears to have chronic lung disease for which she is O2 dependent at home and she has a history of pacer placement.  A deep purulent sacral ulcer is reported.   ETT 2/11 >> , 2/24 >>  STUDIES:  2/22 head CT neg 2/24 head CT neg 2/24 [place] abd >> prior chole, no duct dilatation   CULTURES: 2/24  sputum > ng 2/24 blood >>ng 2/24 urine >ng  ANTIBIOTICS: 2/24 Vancomycin 2/24 meropenem     SUBJECTIVE:  Low gr febrile On 40%/ PEEP 5 UO ok   VITAL SIGNS: BP 119/68   Pulse (!) 116   Temp 100 F (37.8 C) (Oral)   Resp (!) 32   Ht 5\' 4"  (1.626 m)   Wt 186 lb 1.1 oz (84.4 kg)   SpO2 96%   BMI 31.94  kg/m   HEMODYNAMICS: CVP:  [2 mmHg-8 mmHg] 8 mmHg  VENTILATOR SETTINGS: Vent Mode: PSV;CPAP FiO2 (%):  [40 %-60 %] 40 % Set Rate:  [14 bmp] 14 bmp Vt Set:  [440 mL] 440 mL PEEP:  [5 cmH20] 5 cmH20 Pressure Support:  [8 cmH20-12 cmH20] 8 cmH20 Plateau Pressure:  [15 cmH20-19 cmH20] 15 cmH20  INTAKE / OUTPUT: I/O last 3 completed shifts: In: 5252.4 [I.V.:3423.8; NG/GT:1128.7; IV Piggyback:700] Out: 2538 [Urine:2335; Stool:203]  PHYSICAL EXAMINATION: General: appears her stated age was orally intubated and mechanically ventilated and unresponsive on fent gtt Neuro: opens eyes to name & tracks,  does not follow commands,pupils BEERTL Limbs are flaccid and she is areflexic Cardiovascular: S1 and S2 are regular and rapid without murmur rub or gallop.  Lungs:no acc muscle use, there is symmetric air movement, decreased BS BL Abdomen: soft and nontender    LABS:  BMET Recent Labs  Lab 11/14/2017 1019 11/17/17 0411 11/18/17 0438  NA 155* 152* 154*  K 4.0 4.1 3.0*  CL 125* 123* 126*  CO2 21* 22 20*  BUN 92* 58* 46*  CREATININE 1.15* 0.87 0.74  GLUCOSE 139* 192* 328*    Electrolytes Recent Labs  Lab 11/20/2017 1019 11/17/17 0411 11/18/17 0438  CALCIUM 8.2* 8.7* 8.1*  MG  --  2.5* 2.2  PHOS  --  3.2 1.3*    CBC Recent  Labs  Lab 11/04/2017 1019 11/17/17 0411 11/18/17 0438  WBC 26.0* 25.9* 24.1*  HGB 15.2* 13.9 11.3*  HCT 47.1* 44.6 36.1  PLT 67* 54* 41*    Coag's Recent Labs  Lab 11/03/2017 1019  INR 1.38    Sepsis Markers Recent Labs  Lab 11/13/2017 1019 10/27/2017 1253 11/17/17 0411 11/18/17 0438  LATICACIDVEN 1.1 1.1  --   --   PROCALCITON 1.95  --  1.36 0.66    ABG Recent Labs  Lab 10/30/2017 0653 11/10/2017 0930 11/17/17 0650  PHART 7.293* 7.298* 7.396  PCO2ART 40.3 37.9 39.8  PO2ART 161.0* 86.0 86.1    Liver Enzymes Recent Labs  Lab 11/15/17 0457 10/25/2017 1019 11/18/17 0438  AST 95* 110* 52*  ALT 125* 192* 103*  ALKPHOS 228* 449* 300*   BILITOT 1.0 0.9 0.8  ALBUMIN 2.9* 2.3* 2.0*    Cardiac Enzymes Recent Labs  Lab 11/08/2017 0429  TROPONINI 0.81*    Glucose Recent Labs  Lab 11/17/17 1213 11/17/17 1639 11/17/17 2101 11/18/17 0002 11/18/17 0341 11/18/17 0750  GLUCAP 254* 216* 279* 286* 321* 372*    Imaging Ct Angio Head W Or Wo Contrast  Result Date: 11/17/2017 CLINICAL DATA:  52 year old female with recent altered mental status and now worsening neuro deficit. EXAM: CT ANGIOGRAPHY HEAD AND NECK TECHNIQUE: Multidetector CT imaging of the head and neck was performed using the standard protocol during bolus administration of intravenous contrast. Multiplanar CT image reconstructions and MIPs were obtained to evaluate the vascular anatomy. Carotid stenosis measurements (when applicable) are obtained utilizing NASCET criteria, using the distal internal carotid diameter as the denominator. CONTRAST:  50 milliliters ISOVUE-370 IOPAMIDOL (ISOVUE-370) INJECTION 76% COMPARISON:  Noncontrast head CT 10/28/2017, 11/14/2017. chest CT 09/21/2017. FINDINGS: CT HEAD Brain: Normal cerebral volume. No midline shift, ventriculomegaly, mass effect, evidence of mass lesion, intracranial hemorrhage or evidence of cortically based acute infarction. Bilateral cerebral sulci appear stable since 11/14/2017, and normal. Gray-white matter differentiation is within normal limits throughout the brain. No cortical encephalomalacia identified. Calvarium and skull base: Stable and negative. Paranasal sinuses: Intubated. Left side nasoenteric tube in place. Small volume retained secretions in the nasopharynx. Increased fluid in the left maxillary sinus. Mild scattered fluid and/or mucosal thickening elsewhere. Mild bilateral mastoid effusions have developed since 11/14/2017, greater on the right. Tympanic cavities remain clear. Orbits: Visualized orbits and scalp soft tissues are within normal limits. CTA NECK Skeleton: Absent dentition.  No osseous  abnormality identified. Upper chest: Partially visible left chest cardiac pacemaker type device. Endotracheal tube tip not included. Enteric tube courses into the esophagus. There is an 8 millimeter left upper lobe lung nodule which is new since December on series 10, image 160. Otherwise stable and negative visible lung parenchyma. No superior mediastinal lymphadenopathy. Other neck: Enteric tube and endotracheal tube. In place no neck mass or lymphadenopathy. Aortic arch: Mild aortic arch calcified atherosclerosis. Three vessel arch configuration with no great vessel origin stenosis. Right carotid system: Normal right CCA origin. Negative right CCA and right carotid bifurcation. Negative cervical right ICA. Left carotid system: Negative left CCA origin. Negative left CCA. Mild calcified plaque at the left carotid bifurcation and proximal left ICA but no stenosis. Negative cervical left ICA otherwise. Vertebral arteries: Mild calcified plaque at the right subclavian artery origin without stenosis. Normal right vertebral artery origin. Normal cervical right vertebral artery to the skull base. No proximal left subclavian artery plaque or stenosis. Normal left vertebral artery origin. The left vertebral is mildly non dominant and  normal to the skull base. CTA HEAD Posterior circulation: Mild irregularity of the distal right V4 segment without stenosis. The distal left vertebral artery appears normal. Normal left PICA origin. The right AICA appears dominant. The left AICA origin is also patent. Patent basilar artery without stenosis. Normal SCA and right PCA origins. Fetal type left PCA origin. Right posterior communicating artery is diminutive or absent. Bilateral PCA branches are within normal limits. Anterior circulation: Both ICA siphons are patent. Relative to the neck there is moderate bilateral ICA siphon calcified plaque, slightly greater on the right. No significant ICA siphon stenosis is identified. Both  ophthalmic artery origins and the left posterior communicating artery origin are normal. Patent carotid termini. The right ACA A1 segment is dominant, the left is diminutive and thread-like. Normal right ACA and bilateral MCA origins. The anterior communicating artery and bilateral ACA branches appear normal. The left MCA M1 segment, bifurcation, and left MCA branches appear normal. The right MCA M1 segment, bifurcation, and right MCA branches appear normal. Venous sinuses: Appear patent on the delayed images. The left transverse and sigmoid sinus appear dominant. Anatomic variants: Fetal type left PCA origin. Dominant right and diminutive left ACA A1 segments. Delayed phase: No abnormal enhancement identified. Review of the MIP images confirms the above findings IMPRESSION: 1. Negative for emergent large vessel occlusion. Bilateral Circle-of-Willis branches appear within normal limits. 2. Minimal extracranial atherosclerosis, but moderate bilateral ICA siphon calcified plaque. Still, no significant arterial stenosis is identified in the head or neck. 3. Stable and normal CT appearance of the brain. 4. Small 8 mm left upper lobe lung nodule is new since 09/21/2017 and likely infectious or inflammatory. Electronically Signed   By: Odessa Fleming M.D.   On: 11/17/2017 16:18   Ct Angio Neck W Or Wo Contrast  Result Date: 11/17/2017 CLINICAL DATA:  52 year old female with recent altered mental status and now worsening neuro deficit. EXAM: CT ANGIOGRAPHY HEAD AND NECK TECHNIQUE: Multidetector CT imaging of the head and neck was performed using the standard protocol during bolus administration of intravenous contrast. Multiplanar CT image reconstructions and MIPs were obtained to evaluate the vascular anatomy. Carotid stenosis measurements (when applicable) are obtained utilizing NASCET criteria, using the distal internal carotid diameter as the denominator. CONTRAST:  50 milliliters ISOVUE-370 IOPAMIDOL (ISOVUE-370)  INJECTION 76% COMPARISON:  Noncontrast head CT 11/10/2017, 11/14/2017. chest CT 09/21/2017. FINDINGS: CT HEAD Brain: Normal cerebral volume. No midline shift, ventriculomegaly, mass effect, evidence of mass lesion, intracranial hemorrhage or evidence of cortically based acute infarction. Bilateral cerebral sulci appear stable since 11/14/2017, and normal. Gray-white matter differentiation is within normal limits throughout the brain. No cortical encephalomalacia identified. Calvarium and skull base: Stable and negative. Paranasal sinuses: Intubated. Left side nasoenteric tube in place. Small volume retained secretions in the nasopharynx. Increased fluid in the left maxillary sinus. Mild scattered fluid and/or mucosal thickening elsewhere. Mild bilateral mastoid effusions have developed since 11/14/2017, greater on the right. Tympanic cavities remain clear. Orbits: Visualized orbits and scalp soft tissues are within normal limits. CTA NECK Skeleton: Absent dentition.  No osseous abnormality identified. Upper chest: Partially visible left chest cardiac pacemaker type device. Endotracheal tube tip not included. Enteric tube courses into the esophagus. There is an 8 millimeter left upper lobe lung nodule which is new since December on series 10, image 160. Otherwise stable and negative visible lung parenchyma. No superior mediastinal lymphadenopathy. Other neck: Enteric tube and endotracheal tube. In place no neck mass or lymphadenopathy. Aortic arch: Mild aortic  arch calcified atherosclerosis. Three vessel arch configuration with no great vessel origin stenosis. Right carotid system: Normal right CCA origin. Negative right CCA and right carotid bifurcation. Negative cervical right ICA. Left carotid system: Negative left CCA origin. Negative left CCA. Mild calcified plaque at the left carotid bifurcation and proximal left ICA but no stenosis. Negative cervical left ICA otherwise. Vertebral arteries: Mild calcified  plaque at the right subclavian artery origin without stenosis. Normal right vertebral artery origin. Normal cervical right vertebral artery to the skull base. No proximal left subclavian artery plaque or stenosis. Normal left vertebral artery origin. The left vertebral is mildly non dominant and normal to the skull base. CTA HEAD Posterior circulation: Mild irregularity of the distal right V4 segment without stenosis. The distal left vertebral artery appears normal. Normal left PICA origin. The right AICA appears dominant. The left AICA origin is also patent. Patent basilar artery without stenosis. Normal SCA and right PCA origins. Fetal type left PCA origin. Right posterior communicating artery is diminutive or absent. Bilateral PCA branches are within normal limits. Anterior circulation: Both ICA siphons are patent. Relative to the neck there is moderate bilateral ICA siphon calcified plaque, slightly greater on the right. No significant ICA siphon stenosis is identified. Both ophthalmic artery origins and the left posterior communicating artery origin are normal. Patent carotid termini. The right ACA A1 segment is dominant, the left is diminutive and thread-like. Normal right ACA and bilateral MCA origins. The anterior communicating artery and bilateral ACA branches appear normal. The left MCA M1 segment, bifurcation, and left MCA branches appear normal. The right MCA M1 segment, bifurcation, and right MCA branches appear normal. Venous sinuses: Appear patent on the delayed images. The left transverse and sigmoid sinus appear dominant. Anatomic variants: Fetal type left PCA origin. Dominant right and diminutive left ACA A1 segments. Delayed phase: No abnormal enhancement identified. Review of the MIP images confirms the above findings IMPRESSION: 1. Negative for emergent large vessel occlusion. Bilateral Circle-of-Willis branches appear within normal limits. 2. Minimal extracranial atherosclerosis, but moderate  bilateral ICA siphon calcified plaque. Still, no significant arterial stenosis is identified in the head or neck. 3. Stable and normal CT appearance of the brain. 4. Small 8 mm left upper lobe lung nodule is new since 09/21/2017 and likely infectious or inflammatory. Electronically Signed   By: Odessa Fleming M.D.   On: 11/17/2017 16:18   Dg Chest Port 1 View  Result Date: 11/18/2017 CLINICAL DATA:  Hypoxia EXAM: PORTABLE CHEST 1 VIEW COMPARISON:  November 16, 2017 FINDINGS: Endotracheal tube tip is 1.1 cm above the carina. Pacemaker leads are attached to the right atrium and right ventricle. Feeding tube tip is below the diaphragm. No pneumothorax. There is atelectatic change in both lung bases. Lungs elsewhere clear. Heart size and pulmonary vascularity are normal. No adenopathy. No bone lesions. IMPRESSION: Tube positions as described without pneumothorax. Note that the endotracheal tube tip is near the carina. It may be prudent to consider withdrawing the endotracheal tube 2-3 cm. Bibasilar atelectasis.  Lungs elsewhere clear.  Heart size normal. Electronically Signed   By: Bretta Bang III M.D.   On: 11/18/2017 09:02   Dg Abd Portable 1v  Result Date: 11/17/2017 CLINICAL DATA:  Feeding tube placement EXAM: PORTABLE ABDOMEN - 1 VIEW COMPARISON:  CT abdomen and pelvis September 04, 2017 FINDINGS: Feeding tube tip in distal stomach. Bowel gas pattern normal. No free air or portal venous air. IMPRESSION: Feeding tube tip in distal stomach. Bowel gas  pattern unremarkable. No bowel obstruction or free air evident. Electronically Signed   By: Bretta Bang III M.D.   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  She was admitted with respiratory failure and influenza at [facility] subsequently developed type II cardiac ischemia with a Takotsubo appearance on echo.  She was  transferred to [facility] due to diffuse ST segment elevations and is found to have diffuse coronary disease not requiring interventions with an EF estimated at 30% and a normal LVEDP.  She has required reintubation and is unresponsive and febrile. 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 Takatsubo's , HF rEF SSS s/p PPM LV thrombus  - COreg, ASA, plavix, lipitor -Add bival (no heparin)   RENAL A: Hypernatremia  - rising on 1/2 NS Start D5W  Hypokalemia/severe hypophos -replete   GASTROINTESTINAL A: Abnormal LFTs Crohn's disease abd [place] neg  -ct TFs -follow LFTs intermittent  HEMATOLOGIC A:  Thrombocytopenia -HIT panel -AVOID heparin   INFECTIOUS A: Influenza pneumonia RLL HCAP  - dc vancomycin and ct meropenem -await cx data   ENDOCRINE A: DM-2 SSI Add lantus 20 u now ,in anticipation of rising higher with D5W   NEUROLOGIC A: Acute encephalopathy - head CT neg, ammonia slight high -Neuro input , ideally needs MRI but cannot since has PPM -lactulose once daily for now  The patient is critically ill with multiple organ systems failure and requires high complexity decision making for assessment and support, frequent evaluation and titration of therapies, application of advanced monitoring technologies and extensive interpretation of multiple databases. Critical Care Time devoted to patient care services described in this note independent of APP/resident  time is 35 minutes.   Cyril Mourning MD. Tonny Bollman. [facility] Pager [phone] If no response call [phone]     11/18/2017, 10:43 AM

## 2017-11-18 NOTE — Progress Notes (Signed)
Pharmacy Antibiotic Note  [name] is a 52 y.o. female admitted on 19-Nov-2017 with COPD exacerbation, influenza infection, and ultimately respiratory distress requiring intubation. Pt transferred from [facility] to [facility] on 2/24 as code STEMI now s/p cath with ongoing encephalopathy and mechanical vent requirements.  Pharmacy has been consulted for vancomycin and meropenem dosing for possible sepsis.  Tmax 100.6, wbc 24 this morning. Renal function is normal.   Vancomycin trough this morning is within normal limits at 16, no adjustments needed.   Plan: -Vancomycin stopped -Meropenem 1g IV q8h -Vancomycin level at Css -Watch renal funx, cultures, LOT  Height: 5\' 4"  (162.6 cm) Weight: 186 lb 1.1 oz (84.4 kg) IBW/kg (Calculated) : 54.7  Temp (24hrs), Avg:98.9 F (37.2 C), Min:97 F (36.1 C), Max:100.6 F (38.1 C)  Recent Labs  Lab 11/13/17 0541 11/15/17 0457 10/31/2017 1019 11/14/2017 1253 11/17/17 0411 11/18/17 0438 11/18/17 0957  WBC 23.5* 27.5* 26.0*  --  25.9* 24.1*  --   CREATININE 0.66 0.84 1.15*  --  0.87 0.74  --   LATICACIDVEN  --   --  1.1 1.1  --   --   --   VANCOTROUGH  --   --   --   --   --   --  16    Estimated Creatinine Clearance: 87.5 mL/min (by C-G formula based on SCr of 0.74 mg/dL).    Allergies  Allergen Reactions  . Flexeril [Cyclobenzaprine] Hives  . Amoxicillin Hives and Rash    Has patient had a PCN reaction causing immediate rash, facial/tongue/throat swelling, SOB or lightheadedness with hypotension: Yes Has patient had a PCN reaction causing severe rash involving mucus membranes or skin necrosis: Yes Has patient had a PCN reaction that required hospitalization No Has patient had a PCN reaction occurring within the last 10 years: Yes If all of the above answers are "NO", then may proceed with Cephalosporin use.     Antimicrobials this admission: Meropenem 2/24 >> Vanc 2/11 >> 2/14; 2/24 >>2/26 Levaquin 2/11 >> 2/14 Cefepime 2/12 >> Tamiflu  2/11>>2/16  Dose adjustments this admission: 2/26 Vancomycin trough 16 - no changes  Microbiology results: 2/11 BCx: ng final 2/12 MRSA PCR:  negative 2/12 influenza + on tamiflu 2/24 bldx2: ngtd 2/24 urine:ng 2/24 Sputum: ngtd  Thank you for allowing pharmacy to be a part of this patient's care.  Sheppard Coil PharmD., BCPS Clinical Pharmacist 11/18/2017 11:05 AM

## 2017-11-18 NOTE — Progress Notes (Signed)
Speech Language Pathology Discharge Patient Details Name: [name] MRN: [id number] DOB: 10-17-1965 Today's Date: 11/18/2017 Time:  -     Patient discharged from SLP services secondary to medical decline - will need to re-order SLP to resume therapy services. On vent  Please see latest therapy progress note for current level of functioning and progress toward goals.    Progress and discharge plan discussed with patient and/or caregiver: Patient unable to participate in discharge planning and no caregivers available. Please reconsult if/when appropriate  GO     Royce Macadamia 11/18/2017, 7:54 AM    Breck Coons Lonell Face.Ed [employer] [phone]

## 2017-11-18 NOTE — Progress Notes (Signed)
Pharmacy Heparin Induced Thrombocytopenia (HIT) Note:  [name] is a 52 y.o. female being evaluated for HIT. Heparin was started  11/05/17 for ACS, stopped for 2/15, restarted 2/24 and was continued on sq heparin post cath , and baseline platelets were 123 on 2/11, trended up to 219 on 2/20. HIT labs were ordered on 2/26 when platelets dropped to 41.   No results found for: HEPINDPLTAB, SRALOWDOSEHP, SRAHIGHDOSEH   Score = 0 Score = 1 Score = 2 Calculated Score  Thrombocytopenia -Platelet fall < 30% -Any platelet fall with nadir < 10 -Platelet fall >50% BUT surgery within 3 days  -Any combination of platelet fall/nadir that does not fit score 0 or 2 -Platelet fall >50% AND nadir ?20 AND no surgery within 3 days     2  Timing -Platelets fall on days ? 4  AND no prior heparin exposure in previous 100 days -Consistent w/ platelet fall on days 5-10 but missing counts -Platelet fall within 1 day of start AND prior exposure to heparin within previous 31-100 days -Platelet fall after day 10 -Platelet fall 5-10 days after starting heparin -Platelet fall within 1 day of heparin start AND prior exposure within previous 5-30 days 1  Thrombosis -None suspected -Recurrent thrombosis in patient receiving therapeutic anticoagulants -Suspected thrombosis (awaiting confirmation via imaging) -Erythematous skin lesions at heparin injection sites -Confirmed new thrombosis -Skin necrosis at injection site -Anaphylactoid reaction to heparin IV bolus -Adrenal hemorrhage 2  oTher Causes -Probable other cause (see protocol for full list) -Possible other cause evident (see protocol for full list) -No alternative explanation 0  Total Calculated 4T Score:      5   Name of MD Contacted:Alva  Algorithm Recommendation: Possible HIT:  Order SRA.  Initiate alternative anticoagulant.  Document heparin allergy.  Plan: Stop sq heparin, plavix, asa Start bival 0.15mg /kg/hr Hit ab ordered  Sheppard Coil PharmD.,  BCPS Clinical Pharmacist 11/18/2017 11:27 AM

## 2017-11-18 NOTE — Consult Note (Signed)
Consultation Note Date: 11/18/2017   Patient Name: [name]  DOB: January 09, 1966  MRN: [id number]  Age / Sex: 52 y.o., female  PCP: [facility] [place] Referring Physician: Sampson Goon, MD  Reason for Consultation: Establishing goals of care and Psychosocial/spiritual support  HPI/Patient Profile: 52 y.o. 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   Neurology consulted as pt continues to remain encephalopathic and not moving all 4 extremities.  She has a  past psychiatric history to include PTSD, depression.  In spite of medical interventions the patient remains intubated and encephalopathic and unable to follow commands.  Family face ongoing treatment options, advance directive decisions and anticipatory care needs.  Clinical Assessment and Goals of Care:   This NP Wadie Lessen reviewed medical records, received report from team, assessed the patient and then meet at the patient's bedside along with her  2 sons/ [place] and [place], brothers Coralyn Mark and Legrand Como, Sister Justice Rocher and fianc Darryl discuss diagnosis, prognosis, GOC, EOL wishes disposition and options.  I contacted Dr. Elsworth Soho and he spoke to the family via telephone conference call.  He updated family on current medical situation.  Concept of Hospice and Palliative Care were discussed  A detailed discussion was had today regarding advanced directives.  Concepts specific to code  status, artifical feeding and hydration, continued IV antibiotics and rehospitalization was had.  The difference between a aggressive medical intervention path  and a palliative comfort care path for this patient at this time was had.  Values and goals of care important to patient and family were attempted to be elicited.  Family understand the seriousness of the situation.  Her sons verbalize that they believe their mother was aware of her impending illness prior to initial admission to [facility].   All agree that the patient herself desires a natural death.  Natural trajectory and expectations at EOL were discussed.  Questions and concerns addressed.   Family encouraged to call with questions or concerns.    PMT will continue to support holistically.  Brother Berdie Ogren is her HPOA  SUMMARY OF RECOMMENDATIONS    Code Status/Advance Care Planning:  Limited code  Family has made clear decision that they would never allow for a trach and PEG for this patient.  They desire no cardioversion or compressions, or pressor support with medciations  After discussion with Dr. Elsworth Soho, the plan is to continue with current medical interventions over the next few days and then make decision regarding one-way extubation.  At that time family desire that the patient not be reintubated.  Decisions for transition of care would  be made at the time of extubation depending on outcomes.   Palliative Prophylaxis:   Eye Care, Frequent Pain Assessment and Oral Care   Psycho-social/Spiritual:   Desire for further Chaplaincy support:yes  Additional Recommendations: Education on Hospice  Prognosis:   Unable to determine  Discharge Planning: To Be Determined      Primary Diagnoses: Present on Admission: . COPD exacerbation ([place]) . Influenza A . Cardiac pacemaker in situ . Acute respiratory failure with hypoxia ([place]) . Crohn's disease without complication ([place]) . COPD GOLD 0 / still smoking  .  Current smoker . DM type 2 causing vascular disease ([place]) . Diabetic neuropathy ([place]) . Esophageal reflux . Gastroparesis . OSA (obstructive sleep apnea) . Hyponatremia . Hypokalemia . Sepsis ([place])   I have reviewed the medical record, interviewed the patient and family, and examined the patient. The following aspects are pertinent.  Past Medical History:  Diagnosis Date  . Anxiety   . Arthritis   . Cardiac pacemaker in situ 2009   DDD [employer] -- ALTRUNA 60  . COPD with asthma ([place])    GOLD 2-3 --  pulmologist (last visit 2011) Dr. Chase Caller  . Crohn's disease ([place])    Large intestine  . Depression 2016   PTSD  . Essential hypertension   . Family history of adverse reaction to anesthesia    father- stop breathing surgery   . Gastroparesis   . GERD (gastroesophageal reflux disease)   . History of hiatal hernia   . History of kidney stones   . History of stroke    Jun 2011 -- right hand weakness  . History of syncope   . Inappropriate sinus tachycardia    Sinus node modification 02-25-2003 by Dr. Cristopher Peru  . OSA (obstructive sleep apnea)    Study done 2005 -- pt refused CPAP/previously was using nocturnal oxygen until one year ago pt states PCP is monitoring pt without  . Pelvic pain in female   . PTSD (post-traumatic stress disorder)   . Sinus node dysfunction ([facility])    Symptomatic bradycardia  . Type 2 diabetes mellitus ([place])   . Wears glasses    Social History   Socioeconomic History  . Marital status: Divorced    Spouse name: None  . Number of children: 2  . Years of education: None  . Highest education level: None  Social Needs  . Financial resource strain: None  . Food insecurity - worry: None  . Food insecurity - inability: None  . Transportation needs - medical: None  . Transportation needs - non-medical: None  Occupational History  . Occupation: disabled  Tobacco Use  . Smoking status: Current Every Day Smoker    Packs/day: 2.00     Years: 42.00    Pack years: 84.00    Types: Cigarettes    Start date: 02/02/1973  . Smokeless tobacco: Never Used  . Tobacco comment: smoke about 2 packs because I recently lost my mom.  Substance and Sexual Activity  . Alcohol use: No    Alcohol/week: 0.0 oz  . Drug use: No    Comment: per 10/27/2016  pt no  . Sexual activity: Not Currently    Birth control/protection: Post-menopausal  Other Topics Concern  . None  Social History Narrative   Married, 2 boys. 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    Family History  Problem Relation Age of Onset  . Diabetes Mother   . Asthma Mother   . Heart  disease Mother   . Cirrhosis Mother   . Stroke Mother   . Depression Mother   . Heart disease Father        Deceased. MI. Mother, 2 brothers, sister, nephew also have heart disease  . Emphysema Father        Died of it. Was pt of Dr. Melvyn Novas   . Heart attack Father   . Cancer Father        ? type  . Diabetes Brother   . Heart disease Brother   . Diabetes Sister   . Cirrhosis Sister   . Cirrhosis Maternal Grandmother   . Diabetes Brother   . Heart attack Brother   . Heart disease Brother   . Seizures Brother   . Depression Brother   . Colon cancer Neg Hx   . Stomach cancer Neg Hx   . Esophageal cancer Neg Hx   . Rectal cancer Neg Hx   . Liver cancer Neg Hx    Scheduled Meds: . atorvastatin  80 mg Oral q1800  . budesonide (PULMICORT) nebulizer solution  0.5 mg Nebulization BID  . chlorhexidine gluconate (MEDLINE KIT)  15 mL Mouth Rinse BID  . Chlorhexidine Gluconate Cloth  6 each Topical Daily  . collagenase   Topical Daily  . feeding supplement (PRO-STAT SUGAR FREE 64)  30 mL Per Tube BID  . free water  200 mL Per Tube Q4H  . ipratropium  0.5 mg Nebulization Q6H  . lactulose  20 g Per Tube BID  . levalbuterol  0.63 mg Nebulization Q6H  . mouth rinse  15 mL Mouth Rinse 10 times per day  . pantoprazole sodium  40 mg Per Tube QHS  . potassium & sodium  phosphates  2 packet Oral TID  . sodium chloride flush  10-40 mL Intracatheter Q12H  . sodium chloride flush  3 mL Intravenous Q12H   Continuous Infusions: . sodium chloride    . sodium chloride 250 mL (11/17/17 2100)  . sodium chloride    . bivalirudin (ANGIOMAX) infusion 0.5 mg/mL (Non-ACS indications) 0.15 mg/kg/hr (11/18/17 1237)  . dextrose 50 mL/hr at 11/18/17 1200  . feeding supplement (VITAL AF 1.2 CAL) 1,000 mL (11/18/17 0959)  . insulin (NOVOLIN-R) infusion 4.3 Units/hr (11/18/17 1259)  . meropenem (MERREM) IV Stopped (11/18/17 2229)  . potassium PHOSPHATE IVPB (mmol) 30 mmol (11/18/17 1032)   PRN Meds:.sodium chloride, sodium chloride, Place/Maintain arterial line **AND** sodium chloride, acetaminophen (TYLENOL) oral liquid 160 mg/5 mL, acetaminophen **OR** acetaminophen, fentaNYL (SUBLIMAZE) injection, levalbuterol, LORazepam, sodium chloride flush, sodium chloride flush Medications Prior to Admission:  Prior to Admission medications   Medication Sig Start Date End Date Taking? Authorizing Provider  acetaminophen (TYLENOL) 500 MG tablet Take 500 mg by mouth every 6 (six) hours as needed for mild pain or moderate pain.   Yes [provider]  aspirin EC 81 MG tablet Take 81 mg by mouth daily.   Yes [provider]  carvedilol (COREG) 6.25 MG tablet Take 1 tablet (6.25 mg total) by mouth 2 (two) times daily. 07/08/17  Yes Satira Sark, MD  Cholecalciferol (VITAMIN D3) 5000 units CAPS Take 1 capsule (5,000 Units total) by mouth daily. 11/19/16  Yes Cassandria Anger, MD  clopidogrel (PLAVIX) 75 MG tablet Take 75 mg by mouth daily.   Yes [provider]  diclofenac sodium (VOLTAREN) 1 % GEL Apply 4 g topically 4 (four) times daily. Patient taking differently: Apply 2-4 g topically daily as needed (for  leg pain).  11/09/15  Yes Dettinger, Fransisca Kaufmann, MD  dicyclomine (BENTYL) 20 MG tablet Take 1 tablet (20 mg total) by mouth every 8 (eight) hours as  needed for spasms. 04/17/17  Yes Armbruster, Carlota Raspberry, MD  diphenoxylate-atropine (LOMOTIL) 2.5-0.025 MG tablet Take 2 tabs by mouth x 1, then 1 tab every 4 hrs PRN, max of 8 tabs in 24 hour time 07/14/17  Yes Armbruster, Carlota Raspberry, MD  DULoxetine (CYMBALTA) 30 MG capsule Take 90 mg daily (60 mg + 30 mg) Patient taking differently: Take 30 mg by mouth daily.  10/13/17  Yes Norman Clay, MD  DULoxetine (CYMBALTA) 60 MG capsule Take 90 mg daily (60 mg + 30 mg) 10/13/17  Yes Hisada, Reina, MD  furosemide (LASIX) 40 MG tablet TAKE (1) TABLET BY MOUTH EACH MORNING. 07/08/17  Yes Satira Sark, MD  ibuprofen (ADVIL,MOTRIN) 600 MG tablet Take 600 mg by mouth 2 (two) times daily as needed for moderate pain.    Yes [provider]  insulin aspart (NOVOLOG FLEXPEN) 100 UNIT/ML FlexPen Inject 15-21 Units into the skin 3 (three) times daily with meals. 08/12/16  Yes Nida, Marella Chimes, MD  INVOKANA 100 MG TABS tablet TAKE 1 TABLET BY MOUTH DAILY BEFORE BREAKFAST. 03/31/17  Yes Nida, Marella Chimes, MD  LEVEMIR FLEXTOUCH 100 UNIT/ML Pen INJECT 80 UNITS UNDER THE SKIN EVERY DAY AT 10 PM 11/20/16  Yes Nida, Marella Chimes, MD  lisinopril (PRINIVIL,ZESTRIL) 5 MG tablet Take 5 mg by mouth daily.   Yes [provider]  LORazepam (ATIVAN) 0.5 MG tablet Take 1 tablet (0.5 mg total) by mouth daily as needed for anxiety. 10/13/17  Yes Norman Clay, MD  LYRICA 150 MG capsule take 171m by mouth three times daily 10/23/16  Yes [provider]  meloxicam (MOBIC) 15 MG tablet Take 15 mg by mouth daily.   Yes [provider]  metFORMIN (GLUCOPHAGE) 1000 MG tablet TAKE 1 TABLET(1000 MG) BY MOUTH TWICE DAILY WITH A MEAL 08/05/16  Yes MSoyla Dryer PA-C  nitroGLYCERIN (NITROSTAT) 0.4 MG SL tablet Place 1 tablet (0.4 mg total) under the tongue every 5 (five) minutes as needed for chest pain. 04/22/16  Yes KRolland Porter MD  pantoprazole (PROTONIX) 40 MG tablet Take 1 tablet (40 mg total) by  mouth 2 (two) times daily. 09/30/17  Yes Armbruster, SCarlota Raspberry MD  potassium chloride (K-DUR) 10 MEQ tablet Take 1 tablet (10 mEq total) by mouth daily. 07/08/17 11/02/2017 Yes MSatira Sark MD  pramipexole (MIRAPEX) 0.25 MG tablet Take 0.25 mg by mouth 2 (two) times daily.    Yes [provider]  simvastatin (ZOCOR) 10 MG tablet TAKE (1) TABLET BY MOUTH DAILY. Patient taking differently: TAKE (2) TABLET BY MOUTH DAILY. 05/13/17  Yes MSatira Sark MD  tapentadol ([facility]) 100 MG 12 hr tablet Take 100 mg by mouth every 12 (twelve) hours.   Yes [provider]  tiZANidine (ZANAFLEX) 4 MG capsule Take 4 mg by mouth 3 (three) times daily.   Yes [provider]  albuterol (PROVENTIL) (2.5 MG/3ML) 0.083% nebulizer solution Take 3 mLs (2.5 mg total) by nebulization every 6 (six) hours as needed for wheezing or shortness of breath. 11/09/15   Dettinger, JFransisca Kaufmann MD  colestipol (COLESTID) 1 g tablet Take 1 tablet (1 g total) by mouth 2 (two) times daily. Patient not taking: Reported on 11/12/2017 04/18/17   AYetta Flock MD  inFLIXimab 500 mg in sodium chloride 0.9 % 200 mL  Inject 500 mg into the vein every 8 (eight) weeks. 03/11/17   Armbruster, Carlota Raspberry, MD  OXYGEN Inhale 3 L/day into the lungs at bedtime.    [provider]   Allergies  Allergen Reactions  . Flexeril [Cyclobenzaprine] Hives  . Heparin Other (See Comments)    Ruling out HIT, Heparin ab sent 2/26. If negative will need to delete allergy.  . Amoxicillin Hives and Rash    Has patient had a PCN reaction causing immediate rash, facial/tongue/throat swelling, SOB or lightheadedness with hypotension: Yes Has patient had a PCN reaction causing severe rash involving mucus membranes or skin necrosis: Yes Has patient had a PCN reaction that required hospitalization No Has patient had a PCN reaction occurring within the last 10 years: Yes If all of the above answers are "NO", then may proceed  with Cephalosporin use.    Review of Systems  Unable to perform ROS: Intubated    Physical Exam  Vital Signs: BP 115/69   Pulse (!) 109   Temp 100.2 F (37.9 C) (Oral)   Resp (!) 32   Ht 5' 4" (1.626 m)   Wt 84.4 kg (186 lb 1.1 oz)   SpO2 95%   BMI 31.94 kg/m  Pain Assessment: CPOT POSS *See Group Information*: 2-Acceptable,Slightly drowsy, easily aroused Pain Score: 0-No pain   SpO2: SpO2: 95 % O2 Device:SpO2: 95 % O2 Flow Rate: .O2 Flow Rate (L/min): 15 L/min  IO: Intake/output summary:   Intake/Output Summary (Last 24 hours) at 11/18/2017 1359 Last data filed at 11/18/2017 1300 Gross per 24 hour  Intake 3836.7 ml  Output 1958 ml  Net 1878.7 ml    LBM: Last BM Date: 11/17/17 Baseline Weight: Weight: 96.2 kg (212 lb) Most recent weight: Weight: 84.4 kg (186 lb 1.1 oz)     Palliative Assessment/Data:   Discussed with Dr. Elsworth Soho and bedside nursing  Time In: 1530 Time Out: 1645 Time Total: 75 min Greater than 50%  of this time was spent counseling and coordinating care related to the above assessment and plan.  Signed by: Wadie Lessen, NP   Please contact Palliative Medicine Team phone at [phone] for questions and concerns.  For individual provider: See Shea Evans

## 2017-11-18 NOTE — Progress Notes (Signed)
ETT withdrawn 3cm per orders. Tube now secured at 23cm.

## 2017-11-18 NOTE — Progress Notes (Addendum)
Subjective: No significant change from yesterday.  Patient is tracking me in the room, blinks to threat, winces to pain  Exam: Vitals:   11/18/17 0755 11/18/17 0942  BP:  (!) 84/53  Pulse:  (!) 106  Resp:  14  Temp: 100 F (37.8 C)   SpO2: 95% 95%    Physical Exam   HEENT-  Normocephalic, no lesions, without obvious abnormality.  Normal external eye and conjunctiva.   Cardiovascular- S1-S2 audible, pulses palpable throughout   Lungs-no rhonchi or wheezing noted, no excessive working breathing.  Saturations within normal limits Abdomen- All 4 quadrants palpated and nontender Extremities- Warm, dry and intact Musculoskeletal-no joint tenderness, deformity or swelling Skin-warm and dry, no hyperpigmentation, vitiligo, or suspicious lesions    Neuro:  Mental Status: Patient is intubated at current time with breathing over the vent.  Patient does follow commands looking left to right at one point however after that one time I did not get her to do it again.  He needs to have flaccid extremities and cannot follow commands in her extremities Cranial Nerves: II: Blinks to threat III,IV, VI: Opens eyes, able to look left and right however I could not get any vertical movement of her eyes.  Corneals intact pupillary's approximately 4 mm and reactive bilaterally V,VII: Face symmetric, facial light touch sensation normal bilaterally VIII: Opens eyes to voice I Motor: Flaccid throughout Sensory: Winces to pain to noxious stimuli Deep Tendon Reflexes: No reflexes noted Plantars: Mute bilaterally     Medications:  Scheduled: . aspirin  81 mg Per Tube Daily  . atorvastatin  80 mg Oral q1800  . budesonide (PULMICORT) nebulizer solution  0.5 mg Nebulization BID  . chlorhexidine gluconate (MEDLINE KIT)  15 mL Mouth Rinse BID  . Chlorhexidine Gluconate Cloth  6 each Topical Daily  . clopidogrel  75 mg Oral Daily  . collagenase   Topical Daily  . feeding supplement (PRO-STAT SUGAR  FREE 64)  30 mL Per Tube BID  . free water  100 mL Per Tube BID  . heparin  5,000 Units Subcutaneous Q8H  . insulin aspart  0-20 Units Subcutaneous Q4H  . ipratropium  0.5 mg Nebulization Q6H  . lactulose  30 g Per Tube TID  . levalbuterol  0.63 mg Nebulization Q6H  . mouth rinse  15 mL Mouth Rinse 10 times per day  . methylPREDNISolone (SOLU-MEDROL) injection  40 mg Intravenous Q24H  . pantoprazole sodium  40 mg Per Tube QHS  . potassium chloride  40 mEq Oral Once  . sodium chloride flush  10-40 mL Intracatheter Q12H  . sodium chloride flush  3 mL Intravenous Q12H   Continuous: . sodium chloride 75 mL/hr at 11/17/17 2300  . sodium chloride    . sodium chloride 250 mL (11/17/17 2100)  . sodium chloride    . feeding supplement (VITAL AF 1.2 CAL) 1,000 mL (11/17/17 2300)  . meropenem (MERREM) IV Stopped (11/18/17 2637)  . potassium PHOSPHATE IVPB (mmol)    . vancomycin Stopped (11/17/17 2353)   CHY:IFOYDX chloride, sodium chloride, Place/Maintain arterial line **AND** sodium chloride, acetaminophen (TYLENOL) oral liquid 160 mg/5 mL, acetaminophen **OR** acetaminophen, fentaNYL (SUBLIMAZE) injection, levalbuterol, LORazepam, sodium chloride flush, sodium chloride flush  Pertinent Labs/Diagnostics:   Ct Angio Head W Or Wo Contrast  Result Date: 11/17/2017 CLINICAL DATA:  52 year old female with recent altered mental status and now worsening neuro deficit. EXAM: CT ANGIOGRAPHY HEAD AND NECK TECHNIQUE: Multidetector CT imaging of the head and neck was  performed using the standard protocol during bolus administration of intravenous contrast. Multiplanar CT image reconstructions and MIPs were obtained to evaluate the vascular anatomy. Carotid stenosis measurements (when applicable) are obtained utilizing NASCET criteria, using the distal internal carotid diameter as the denominator. CONTRAST:  50 milliliters ISOVUE-370 IOPAMIDOL (ISOVUE-370) INJECTION 76% COMPARISON:  Noncontrast head CT  10/25/2017, 11/14/2017. chest CT 09/21/2017. FINDINGS: CT HEAD Brain: Normal cerebral volume. No midline shift, ventriculomegaly, mass effect, evidence of mass lesion, intracranial hemorrhage or evidence of cortically based acute infarction. Bilateral cerebral sulci appear stable since 11/14/2017, and normal. Gray-white matter differentiation is within normal limits throughout the brain. No cortical encephalomalacia identified. Calvarium and skull base: Stable and negative. Paranasal sinuses: Intubated. Left side nasoenteric tube in place. Small volume retained secretions in the nasopharynx. Increased fluid in the left maxillary sinus. Mild scattered fluid and/or mucosal thickening elsewhere. Mild bilateral mastoid effusions have developed since 11/14/2017, greater on the right. Tympanic cavities remain clear. Orbits: Visualized orbits and scalp soft tissues are within normal limits. CTA NECK Skeleton: Absent dentition.  No osseous abnormality identified. Upper chest: Partially visible left chest cardiac pacemaker type device. Endotracheal tube tip not included. Enteric tube courses into the esophagus. There is an 8 millimeter left upper lobe lung nodule which is new since December on series 10, image 160. Otherwise stable and negative visible lung parenchyma. No superior mediastinal lymphadenopathy. Other neck: Enteric tube and endotracheal tube. In place no neck mass or lymphadenopathy. Aortic arch: Mild aortic arch calcified atherosclerosis. Three vessel arch configuration with no great vessel origin stenosis. Right carotid system: Normal right CCA origin. Negative right CCA and right carotid bifurcation. Negative cervical right ICA. Left carotid system: Negative left CCA origin. Negative left CCA. Mild calcified plaque at the left carotid bifurcation and proximal left ICA but no stenosis. Negative cervical left ICA otherwise. Vertebral arteries: Mild calcified plaque at the right subclavian artery origin without  stenosis. Normal right vertebral artery origin. Normal cervical right vertebral artery to the skull base. No proximal left subclavian artery plaque or stenosis. Normal left vertebral artery origin. The left vertebral is mildly non dominant and normal to the skull base. CTA HEAD Posterior circulation: Mild irregularity of the distal right V4 segment without stenosis. The distal left vertebral artery appears normal. Normal left PICA origin. The right AICA appears dominant. The left AICA origin is also patent. Patent basilar artery without stenosis. Normal SCA and right PCA origins. Fetal type left PCA origin. Right posterior communicating artery is diminutive or absent. Bilateral PCA branches are within normal limits. Anterior circulation: Both ICA siphons are patent. Relative to the neck there is moderate bilateral ICA siphon calcified plaque, slightly greater on the right. No significant ICA siphon stenosis is identified. Both ophthalmic artery origins and the left posterior communicating artery origin are normal. Patent carotid termini. The right ACA A1 segment is dominant, the left is diminutive and thread-like. Normal right ACA and bilateral MCA origins. The anterior communicating artery and bilateral ACA branches appear normal. The left MCA M1 segment, bifurcation, and left MCA branches appear normal. The right MCA M1 segment, bifurcation, and right MCA branches appear normal. Venous sinuses: Appear patent on the delayed images. The left transverse and sigmoid sinus appear dominant. Anatomic variants: Fetal type left PCA origin. Dominant right and diminutive left ACA A1 segments. Delayed phase: No abnormal enhancement identified. Review of the MIP images confirms the above findings IMPRESSION: 1. Negative for emergent large vessel occlusion. Bilateral Circle-of-Willis branches appear within normal  limits. 2. Minimal extracranial atherosclerosis, but moderate bilateral ICA siphon calcified plaque. Still, no  significant arterial stenosis is identified in the head or neck. 3. Stable and normal CT appearance of the brain. 4. Small 8 mm left upper lobe lung nodule is new since 09/21/2017 and likely infectious or inflammatory. Electronically Signed   By: Genevie Ann M.D.   On: 11/17/2017 16:18   Ct Head Wo Contrast  Result Date: 11/20/2017 . IMPRESSION: 1. No intracranial abnormality. No change from the recent prior exam. Electronically Signed   By: Lajean Manes M.D.   On: 11/04/2017 10:15   Ct Angio Neck W Or Wo Contrast 11/17/2017  IMPRESSION: 1. Negative for emergent large vessel occlusion. Bilateral Circle-of-Willis branches appear within normal limits. 2. Minimal extracranial atherosclerosis, but moderate bilateral ICA siphon calcified plaque. Still, no significant arterial stenosis is identified in the head or neck. 3. Stable and normal CT appearance of the brain. 4. Small 8 mm left upper lobe lung nodule is new since 09/21/2017 and likely infectious or inflammatory. Electronically Signed   By: Genevie Ann M.D.   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  She also found to have an ejection fraction of 30% on echocardiogram.  CT of head was obtained and did not show any abnormalities.  CTA of head and neck did not show any large vessel occlusion.  D/D include CVA--not seen on CT as this could be a shower of emboli, Spinal infarction, AIDP, critical illness myopathy,metabolic and toxic encephalopathy   Recommendations: Unfortunately due to pacemaker cannot obtain MRI brain and C spine.  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 .  CK Continue to treat hyperammonemia    11/18/2017, 9:51  AM    NEUROHOSPITALIST ADDENDUM Seen and examined the patient today. Formulated plan as documented above by PAC/Resident. I agree with recommendations as above.  Will follow.  Karena Addison Alontae Chaloux MD [facility] [phone]  If 7pm to 7am, please call on call as listed on AMION.

## 2017-11-18 NOTE — Progress Notes (Addendum)
PULMONARY / CRITICAL CARE MEDICINE   Name: [name] MRN: [id number] DOB: 1966-02-14    ADMISSION DATE:  10/28/2017  CHIEF COMPLAINT:  Respiratory failure  HISTORY OF PRESENT ILLNESS:   This is a chronically ill 52 year old who presented to [facility] on 2/11 complaining of dyspnea nausea vomiting and diarrhea.  She was found to be influenza positive and required intubation and mechanical ventilation.  She was intubated until 2/21 and when she was extubated she was encephalopathic.  In addition she had an elevation of her cardiac enzymes and an echocardiogram was obtained showing an ejection fraction of 30% felt to be consistent with a Takotsubo syndrome.  She subsequently developed diffuse ST segment elevation with most pronounced elevation inferiorly were Q waves are present.  She was transferred to this hospital and on arrival required intubation for respiratory distress.  She was taken to the cardiac catheterization laboratory where she was found to have diffuse disease not requiring intervention.  Of note her LVEDP was normal at 11.  She was transferred to the intensive care unit where she is unresponsive despite receiving no sedative agents. She has a complex past medical history and it appears that she receives an anti-TNF agent for Crohn's disease.  She also appears to have chronic lung disease for which she is O2 dependent at home and she has a history of pacer placement.  A deep purulent sacral ulcer is reported.   ETT 2/11 >> , 2/24 >>  STUDIES:  2/22 head CT neg 2/24 head CT neg 2/24 [place] abd >> prior chole, no duct dilatation   CULTURES: 2/24  sputum >> GS>> MODERATE GRAM POSITIVE COCCI IN CLUSTERS 2/24 blood  2/24 urine>> No growth   ANTIBIOTICS: 2/24 Vancomycin 2/24 meropenem     SUBJECTIVE:  T Max 100.6 On 40 %/ PEEP 5 UO ok Tracks but no following commands    VITAL SIGNS: BP 117/74   Pulse (!) 114   Temp 100 F (37.8 C) (Oral)   Resp (!) 22    Ht 5\' 4"  (1.626 m)   Wt 186 lb 1.1 oz (84.4 kg)   SpO2 97%   BMI 31.94 kg/m   HEMODYNAMICS: CVP:  [2 mmHg-8 mmHg] 8 mmHg  VENTILATOR SETTINGS: Vent Mode: PRVC FiO2 (%):  [40 %-60 %] 40 % Set Rate:  [14 bmp] 14 bmp Vt Set:  [440 mL] 440 mL PEEP:  [5 cmH20] 5 cmH20 Plateau Pressure:  [15 cmH20-19 cmH20] 15 cmH20  INTAKE / OUTPUT: I/O last 3 completed shifts: In: 5192.4 [I.V.:3423.8; NG/GT:1068.7; IV Piggyback:700] Out: 2538 [Urine:2335; Stool:203]  PHYSICAL EXAMINATION: General: supine in bed, orally intubated and mechanically ventilated , eyes open Neuro: opens eyes to name, does not follow commands,pupils BEERTL Limbs are flaccid and she is areflexic, she did track today per nursing Cardiovascular: S1 and S2, RRR, No RMG  Lungs:no acc muscle use, there is symmetric air movement, decreased BS BL, few wheezes noted Abdomen: The abdomen is obese soft and nontender specifically appreciate no right upper quadrant tenderness Skin: Dry and intact, multiple tattoos, sacral ulcer   LABS:  BMET Recent Labs  Lab 12/03/2017 1019 11/17/17 0411 11/18/17 0438  NA 155* 152* 154*  K 4.0 4.1 3.0*  CL 125* 123* 126*  CO2 21* 22 20*  BUN 92* 58* 46*  CREATININE 1.15* 0.87 0.74  GLUCOSE 139* 192* 328*    Electrolytes Recent Labs  Lab 2017-12-03 1019 11/17/17 0411 11/18/17 0438  CALCIUM 8.2* 8.7* 8.1*  MG  --  2.5* 2.2  PHOS  --  3.2 1.3*    CBC Recent Labs  Lab 11/07/2017 1019 11/17/17 0411 11/18/17 0438  WBC 26.0* 25.9* 24.1*  HGB 15.2* 13.9 11.3*  HCT 47.1* 44.6 36.1  PLT 67* 54* 41*    Coag's Recent Labs  Lab 11/04/2017 1019  INR 1.38    Sepsis Markers Recent Labs  Lab 11/04/2017 1019 11/08/2017 1253 11/17/17 0411 11/18/17 0438  LATICACIDVEN 1.1 1.1  --   --   PROCALCITON 1.95  --  1.36 0.66    ABG Recent Labs  Lab 10/31/2017 0653 11/19/2017 0930 11/17/17 0650  PHART 7.293* 7.298* 7.396  PCO2ART 40.3 37.9 39.8  PO2ART 161.0* 86.0 86.1    Liver  Enzymes Recent Labs  Lab 11/15/17 0457 11/02/2017 1019 11/18/17 0438  AST 95* 110* 52*  ALT 125* 192* 103*  ALKPHOS 228* 449* 300*  BILITOT 1.0 0.9 0.8  ALBUMIN 2.9* 2.3* 2.0*    Cardiac Enzymes Recent Labs  Lab 11/02/2017 0429  TROPONINI 0.81*    Glucose Recent Labs  Lab 11/17/17 0737 11/17/17 1213 11/17/17 1639 11/17/17 2101 11/18/17 0002 11/18/17 0341  GLUCAP 164* 254* 216* 279* 286* 321*    Imaging Ct Angio Head W Or Wo Contrast  Result Date: 11/17/2017 CLINICAL DATA:  52 year old female with recent altered mental status and now worsening neuro deficit. EXAM: CT ANGIOGRAPHY HEAD AND NECK TECHNIQUE: Multidetector CT imaging of the head and neck was performed using the standard protocol during bolus administration of intravenous contrast. Multiplanar CT image reconstructions and MIPs were obtained to evaluate the vascular anatomy. Carotid stenosis measurements (when applicable) are obtained utilizing NASCET criteria, using the distal internal carotid diameter as the denominator. CONTRAST:  50 milliliters ISOVUE-370 IOPAMIDOL (ISOVUE-370) INJECTION 76% COMPARISON:  Noncontrast head CT 10/26/2017, 11/14/2017. chest CT 09/21/2017. FINDINGS: CT HEAD Brain: Normal cerebral volume. No midline shift, ventriculomegaly, mass effect, evidence of mass lesion, intracranial hemorrhage or evidence of cortically based acute infarction. Bilateral cerebral sulci appear stable since 11/14/2017, and normal. Gray-white matter differentiation is within normal limits throughout the brain. No cortical encephalomalacia identified. Calvarium and skull base: Stable and negative. Paranasal sinuses: Intubated. Left side nasoenteric tube in place. Small volume retained secretions in the nasopharynx. Increased fluid in the left maxillary sinus. Mild scattered fluid and/or mucosal thickening elsewhere. Mild bilateral mastoid effusions have developed since 11/14/2017, greater on the right. Tympanic cavities remain  clear. Orbits: Visualized orbits and scalp soft tissues are within normal limits. CTA NECK Skeleton: Absent dentition.  No osseous abnormality identified. Upper chest: Partially visible left chest cardiac pacemaker type device. Endotracheal tube tip not included. Enteric tube courses into the esophagus. There is an 8 millimeter left upper lobe lung nodule which is new since December on series 10, image 160. Otherwise stable and negative visible lung parenchyma. No superior mediastinal lymphadenopathy. Other neck: Enteric tube and endotracheal tube. In place no neck mass or lymphadenopathy. Aortic arch: Mild aortic arch calcified atherosclerosis. Three vessel arch configuration with no great vessel origin stenosis. Right carotid system: Normal right CCA origin. Negative right CCA and right carotid bifurcation. Negative cervical right ICA. Left carotid system: Negative left CCA origin. Negative left CCA. Mild calcified plaque at the left carotid bifurcation and proximal left ICA but no stenosis. Negative cervical left ICA otherwise. Vertebral arteries: Mild calcified plaque at the right subclavian artery origin without stenosis. Normal right vertebral artery origin. Normal cervical right vertebral artery to the skull base. No proximal left subclavian artery plaque or  stenosis. Normal left vertebral artery origin. The left vertebral is mildly non dominant and normal to the skull base. CTA HEAD Posterior circulation: Mild irregularity of the distal right V4 segment without stenosis. The distal left vertebral artery appears normal. Normal left PICA origin. The right AICA appears dominant. The left AICA origin is also patent. Patent basilar artery without stenosis. Normal SCA and right PCA origins. Fetal type left PCA origin. Right posterior communicating artery is diminutive or absent. Bilateral PCA branches are within normal limits. Anterior circulation: Both ICA siphons are patent. Relative to the neck there is  moderate bilateral ICA siphon calcified plaque, slightly greater on the right. No significant ICA siphon stenosis is identified. Both ophthalmic artery origins and the left posterior communicating artery origin are normal. Patent carotid termini. The right ACA A1 segment is dominant, the left is diminutive and thread-like. Normal right ACA and bilateral MCA origins. The anterior communicating artery and bilateral ACA branches appear normal. The left MCA M1 segment, bifurcation, and left MCA branches appear normal. The right MCA M1 segment, bifurcation, and right MCA branches appear normal. Venous sinuses: Appear patent on the delayed images. The left transverse and sigmoid sinus appear dominant. Anatomic variants: Fetal type left PCA origin. Dominant right and diminutive left ACA A1 segments. Delayed phase: No abnormal enhancement identified. Review of the MIP images confirms the above findings IMPRESSION: 1. Negative for emergent large vessel occlusion. Bilateral Circle-of-Willis branches appear within normal limits. 2. Minimal extracranial atherosclerosis, but moderate bilateral ICA siphon calcified plaque. Still, no significant arterial stenosis is identified in the head or neck. 3. Stable and normal CT appearance of the brain. 4. Small 8 mm left upper lobe lung nodule is new since 09/21/2017 and likely infectious or inflammatory. Electronically Signed   By: Odessa Fleming M.D.   On: 11/17/2017 16:18   Ct Angio Neck W Or Wo Contrast  Result Date: 11/17/2017 CLINICAL DATA:  52 year old female with recent altered mental status and now worsening neuro deficit. EXAM: CT ANGIOGRAPHY HEAD AND NECK TECHNIQUE: Multidetector CT imaging of the head and neck was performed using the standard protocol during bolus administration of intravenous contrast. Multiplanar CT image reconstructions and MIPs were obtained to evaluate the vascular anatomy. Carotid stenosis measurements (when applicable) are obtained utilizing NASCET  criteria, using the distal internal carotid diameter as the denominator. CONTRAST:  50 milliliters ISOVUE-370 IOPAMIDOL (ISOVUE-370) INJECTION 76% COMPARISON:  Noncontrast head CT 11/14/2017, 11/14/2017. chest CT 09/21/2017. FINDINGS: CT HEAD Brain: Normal cerebral volume. No midline shift, ventriculomegaly, mass effect, evidence of mass lesion, intracranial hemorrhage or evidence of cortically based acute infarction. Bilateral cerebral sulci appear stable since 11/14/2017, and normal. Gray-white matter differentiation is within normal limits throughout the brain. No cortical encephalomalacia identified. Calvarium and skull base: Stable and negative. Paranasal sinuses: Intubated. Left side nasoenteric tube in place. Small volume retained secretions in the nasopharynx. Increased fluid in the left maxillary sinus. Mild scattered fluid and/or mucosal thickening elsewhere. Mild bilateral mastoid effusions have developed since 11/14/2017, greater on the right. Tympanic cavities remain clear. Orbits: Visualized orbits and scalp soft tissues are within normal limits. CTA NECK Skeleton: Absent dentition.  No osseous abnormality identified. Upper chest: Partially visible left chest cardiac pacemaker type device. Endotracheal tube tip not included. Enteric tube courses into the esophagus. There is an 8 millimeter left upper lobe lung nodule which is new since December on series 10, image 160. Otherwise stable and negative visible lung parenchyma. No superior mediastinal lymphadenopathy. Other neck: Enteric tube  and endotracheal tube. In place no neck mass or lymphadenopathy. Aortic arch: Mild aortic arch calcified atherosclerosis. Three vessel arch configuration with no great vessel origin stenosis. Right carotid system: Normal right CCA origin. Negative right CCA and right carotid bifurcation. Negative cervical right ICA. Left carotid system: Negative left CCA origin. Negative left CCA. Mild calcified plaque at the left  carotid bifurcation and proximal left ICA but no stenosis. Negative cervical left ICA otherwise. Vertebral arteries: Mild calcified plaque at the right subclavian artery origin without stenosis. Normal right vertebral artery origin. Normal cervical right vertebral artery to the skull base. No proximal left subclavian artery plaque or stenosis. Normal left vertebral artery origin. The left vertebral is mildly non dominant and normal to the skull base. CTA HEAD Posterior circulation: Mild irregularity of the distal right V4 segment without stenosis. The distal left vertebral artery appears normal. Normal left PICA origin. The right AICA appears dominant. The left AICA origin is also patent. Patent basilar artery without stenosis. Normal SCA and right PCA origins. Fetal type left PCA origin. Right posterior communicating artery is diminutive or absent. Bilateral PCA branches are within normal limits. Anterior circulation: Both ICA siphons are patent. Relative to the neck there is moderate bilateral ICA siphon calcified plaque, slightly greater on the right. No significant ICA siphon stenosis is identified. Both ophthalmic artery origins and the left posterior communicating artery origin are normal. Patent carotid termini. The right ACA A1 segment is dominant, the left is diminutive and thread-like. Normal right ACA and bilateral MCA origins. The anterior communicating artery and bilateral ACA branches appear normal. The left MCA M1 segment, bifurcation, and left MCA branches appear normal. The right MCA M1 segment, bifurcation, and right MCA branches appear normal. Venous sinuses: Appear patent on the delayed images. The left transverse and sigmoid sinus appear dominant. Anatomic variants: Fetal type left PCA origin. Dominant right and diminutive left ACA A1 segments. Delayed phase: No abnormal enhancement identified. Review of the MIP images confirms the above findings IMPRESSION: 1. Negative for emergent large vessel  occlusion. Bilateral Circle-of-Willis branches appear within normal limits. 2. Minimal extracranial atherosclerosis, but moderate bilateral ICA siphon calcified plaque. Still, no significant arterial stenosis is identified in the head or neck. 3. Stable and normal CT appearance of the brain. 4. Small 8 mm left upper lobe lung nodule is new since 09/21/2017 and likely infectious or inflammatory. Electronically Signed   By: Odessa Fleming M.D.   On: 11/17/2017 16:18   Dg Abd Portable 1v  Result Date: 11/17/2017 CLINICAL DATA:  Feeding tube placement EXAM: PORTABLE ABDOMEN - 1 VIEW COMPARISON:  CT abdomen and pelvis September 04, 2017 FINDINGS: Feeding tube tip in distal stomach. Bowel gas pattern normal. No free air or portal venous air. IMPRESSION: Feeding tube tip in distal stomach. Bowel gas pattern unremarkable. No bowel obstruction or free air evident. Electronically Signed   By: Bretta Bang III M.D.   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  She was admitted with respiratory failure and influenza at [facility] subsequently developed type II cardiac ischemia with a Takotsubo appearance on echo.  She was transferred to [facility] due to diffuse ST segment elevations and is found to have diffuse coronary disease not requiring interventions with an EF estimated at 30% and a normal LVEDP.  She  has required reintubation and is unresponsive and febrile. 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 Takatsubo's , HF rEF SSS s/p PPM Echo 2/25>>  - LVEF is approximately 50% with apical hypokinesis,  inferoapical akinesis. With Definity   a mass is seen at apex consistent with thrombus (2.2 x 2.3 cm)   The cavity size was mildly reduced. Wall thickness was increased   in a pattern of moderate LVH. - Compared to echo from 11/04/17 LVEF is better Thrombus is again   apparent - Plan: - COreg, ASA, plavix, lipitor - tele monitoring - Maintain MAP  > 65  RENAL A: Hypernatremia Hypokalemia>> repleted 2/26 Hypophos>> repleted 2/26 Plan: 1/2 NS @ 75/hadding  Add free water 100 cc Q 12 Replete electrolytes as needed Trend BMET and UO Avoid nephrotoxic medications Maintain renal perfusion.   GASTROINTESTINAL A: Abnormal LFTs>> down trending Crohn's disease abd [place] neg - Plan - start TFs - SUP - follow LFTs intermittent  HEMATOLOGIC A:  Thrombocytopenia>> continues to down trend 41,000 No obvioius source of bleeding Plan: - HIT panel - AVOID heparin - Monitor for obvious bleeding - transfuse for HGB < 7 - Transfuse platelets if actively bleeding    INFECTIOUS A: Influenza pneumonia RLL HCAP Leukocytosis  - Continue vancomycin and meropenem - await cx data - Trend WBC/ Fever curve - Re-culture as is clinically appropriate   ENDOCRINE A: DM-2 SSI   NEUROLOGIC A: Acute encephalopathy - head CT neg, ammonia slight high Pt. Is tracking, but does not follow commands - Neuro input - Continue lactulose for now - Trend Ammonia   Bevelyn Ngo, AGACNP-BC [place] [facility] Pager [phone]     11/18/2017, 9:01 AM

## 2017-11-18 NOTE — Progress Notes (Signed)
ANTICOAGULATION CONSULT NOTE   Pharmacy Consult for Bivalirudin  Indication: ACS/STEMI / LV thrombus ? Possible HIT  Allergies  Allergen Reactions  . Flexeril [Cyclobenzaprine] Hives  . Heparin Other (See Comments)    Ruling out HIT, Heparin ab sent 2/26. If negative will need to delete allergy.  . Amoxicillin Hives and Rash    Has patient had a PCN reaction causing immediate rash, facial/tongue/throat swelling, SOB or lightheadedness with hypotension: Yes Has patient had a PCN reaction causing severe rash involving mucus membranes or skin necrosis: Yes Has patient had a PCN reaction that required hospitalization No Has patient had a PCN reaction occurring within the last 10 years: Yes If all of the above answers are "NO", then may proceed with Cephalosporin use.     Patient Measurements: Height: 5\' 4"  (162.6 cm) Weight: 186 lb 1.1 oz (84.4 kg) IBW/kg (Calculated) : 54.7 HEPARIN DW (KG): 75.9   Vital Signs: Temp: 101.5 F (38.6 C) (02/26 1600) Temp Source: Axillary (02/26 1600) BP: 106/62 (02/26 1700) Pulse Rate: 109 (02/26 1700)  Labs: Recent Labs    11/10/2017 0429  11/19/2017 1019 11/17/17 0411 11/18/17 0438 11/18/17 0957 11/18/17 1200 11/18/17 1420  HGB  --    < > 15.2* 13.9 11.3*  --   --   --   HCT  --   --  47.1* 44.6 36.1  --   --   --   PLT  --   --  67* 54* 41*  --   --   --   APTT  --   --   --   --   --   --  43* 91*  LABPROT  --   --  16.8*  --   --   --   --   --   INR  --   --  1.38  --   --   --   --   --   CREATININE  --   --  1.15* 0.87 0.74  --   --   --   CKTOTAL  --   --   --   --   --  125  --   --   TROPONINI 0.81*  --   --   --   --   --   --   --    < > = values in this interval not displayed.   Estimated Creatinine Clearance: 87.5 mL/min (by C-G formula based on SCr of 0.74 mg/dL).  Medical History: Past Medical History:  Diagnosis Date  . Anxiety   . Arthritis   . Cardiac pacemaker in situ 2009   DDD [employer] -- ALTRUNA 60   . COPD with asthma (HCC)    GOLD 2-3 --  pulmologist (last visit 2011) Dr. Marchelle Gearing  . Crohn's disease (HCC)    Large intestine  . Depression 2016   PTSD  . Essential hypertension   . Family history of adverse reaction to anesthesia    father- stop breathing surgery   . Gastroparesis   . GERD (gastroesophageal reflux disease)   . History of hiatal hernia   . History of kidney stones   . History of stroke    Jun 2011 -- right hand weakness  . History of syncope   . Inappropriate sinus tachycardia    Sinus node modification 02-25-2003 by Dr. Lewayne Bunting  . OSA (obstructive sleep apnea)    Study done 2005 -- pt refused CPAP/previously was using nocturnal  oxygen until one year ago pt states PCP is monitoring pt without  . Pelvic pain in female   . PTSD (post-traumatic stress disorder)   . Sinus node dysfunction (HCC)    Symptomatic bradycardia  . Type 2 diabetes mellitus (HCC)   . Wears glasses     Medications:  Heparin 5000 units SubQ 2/15>>2/24 IV heparin 2/13>>2/15  Assessment: 52 yo female admitted 2/11 with hypoxic respiratory failure and sepsis; developed NSTEMI 2/12-2/13. Pt now tachycardic w/ ST abnormality. Pharmacy had been consulted for IV heparin dosing, patient take to cath lab > non-obstructive CAD, EF 30%, LV thrombus seen on ECHO,  PLTC dropped to 41, stable h/h, and HIT antibody + > SRA sent  Heparin drip stopped and bivalirudin started 0.15mg /kg/hr  Initial aptt 91 sec > goal.    Goal of Therapy:  aptt 50-85 sec Monitor platelets by anticoagulation protocol; Yes   Plan:  Decrease bivalirudin 0.12mg /kg/hr  Draw aPTT 4 hr after rate decrease Daily aPTT, CBC, Bmet Follow up on HIT panel   Leota Sauers Pharm.D. CPP, BCPS Clinical Pharmacist [phone] 11/18/2017 5:29 PM

## 2017-11-18 NOTE — Progress Notes (Addendum)
   11/18/17 1700  Clinical Encounter Type  Visited With Patient  Visit Type Initial  Referral From Nurse  Consult/Referral To Chaplain  Spiritual Encounters  Spiritual Needs Prayer  Stress Factors  Patient Stress Factors Exhausted   Chaplain visited this pt. From consult list. Check with nurse and found Pt. To be chronically ill. Patient was unresponsive however, chaplain identified herself and prayed with pt.

## 2017-11-19 ENCOUNTER — Inpatient Hospital Stay (HOSPITAL_COMMUNITY): Payer: Medicaid Other

## 2017-11-19 LAB — COMPREHENSIVE METABOLIC PANEL
ALK PHOS: 251 U/L — AB (ref 38–126)
ALT: 75 U/L — ABNORMAL HIGH (ref 14–54)
ANION GAP: 8 (ref 5–15)
AST: 37 U/L (ref 15–41)
Albumin: 2 g/dL — ABNORMAL LOW (ref 3.5–5.0)
BUN: 37 mg/dL — ABNORMAL HIGH (ref 6–20)
CALCIUM: 7.9 mg/dL — AB (ref 8.9–10.3)
CO2: 18 mmol/L — AB (ref 22–32)
Chloride: 124 mmol/L — ABNORMAL HIGH (ref 101–111)
Creatinine, Ser: 0.69 mg/dL (ref 0.44–1.00)
GFR calc non Af Amer: >60 mL/min (ref >60)
Glucose, Bld: 307 mg/dL — ABNORMAL HIGH (ref 65–99)
Potassium: 3.2 mmol/L — ABNORMAL LOW (ref 3.5–5.1)
SODIUM: 150 mmol/L — AB (ref 135–145)
Total Bilirubin: 0.6 mg/dL (ref 0.3–1.2)
Total Protein: 4.5 g/dL — ABNORMAL LOW (ref 6.5–8.1)

## 2017-11-19 LAB — GLUCOSE, CAPILLARY
GLUCOSE-CAPILLARY: 102 mg/dL — AB (ref 65–99)
GLUCOSE-CAPILLARY: 112 mg/dL — AB (ref 65–99)
GLUCOSE-CAPILLARY: 129 mg/dL — AB (ref 65–99)
GLUCOSE-CAPILLARY: 142 mg/dL — AB (ref 65–99)
GLUCOSE-CAPILLARY: 158 mg/dL — AB (ref 65–99)
GLUCOSE-CAPILLARY: 183 mg/dL — AB (ref 65–99)
GLUCOSE-CAPILLARY: 185 mg/dL — AB (ref 65–99)
GLUCOSE-CAPILLARY: 218 mg/dL — AB (ref 65–99)
GLUCOSE-CAPILLARY: 254 mg/dL — AB (ref 65–99)
GLUCOSE-CAPILLARY: 254 mg/dL — AB (ref 65–99)
GLUCOSE-CAPILLARY: 314 mg/dL — AB (ref 65–99)
Glucose-Capillary: 103 mg/dL — ABNORMAL HIGH (ref 65–99)
Glucose-Capillary: 123 mg/dL — ABNORMAL HIGH (ref 65–99)
Glucose-Capillary: 142 mg/dL — ABNORMAL HIGH (ref 65–99)
Glucose-Capillary: 163 mg/dL — ABNORMAL HIGH (ref 65–99)
Glucose-Capillary: 168 mg/dL — ABNORMAL HIGH (ref 65–99)
Glucose-Capillary: 183 mg/dL — ABNORMAL HIGH (ref 65–99)
Glucose-Capillary: 184 mg/dL — ABNORMAL HIGH (ref 65–99)
Glucose-Capillary: 219 mg/dL — ABNORMAL HIGH (ref 65–99)
Glucose-Capillary: 225 mg/dL — ABNORMAL HIGH (ref 65–99)
Glucose-Capillary: 226 mg/dL — ABNORMAL HIGH (ref 65–99)
Glucose-Capillary: 246 mg/dL — ABNORMAL HIGH (ref 65–99)
Glucose-Capillary: 84 mg/dL (ref 65–99)

## 2017-11-19 LAB — BASIC METABOLIC PANEL
Anion gap: 9 (ref 5–15)
BUN: 35 mg/dL — AB (ref 6–20)
CHLORIDE: 123 mmol/L — AB (ref 101–111)
CO2: 20 mmol/L — AB (ref 22–32)
CREATININE: 0.64 mg/dL (ref 0.44–1.00)
Calcium: 8.3 mg/dL — ABNORMAL LOW (ref 8.9–10.3)
GFR calc Af Amer: >60 mL/min (ref >60)
GFR calc non Af Amer: >60 mL/min (ref >60)
GLUCOSE: 115 mg/dL — AB (ref 65–99)
POTASSIUM: 4.4 mmol/L (ref 3.5–5.1)
SODIUM: 152 mmol/L — AB (ref 135–145)

## 2017-11-19 LAB — HEPARIN INDUCED PLATELET AB (HIT ANTIBODY): HEPARIN INDUCED PLT AB: 0.35 {OD_unit} (ref 0.000–0.400)

## 2017-11-19 LAB — CBC
HCT: 30.5 % — ABNORMAL LOW (ref 36.0–46.0)
Hemoglobin: 9.8 g/dL — ABNORMAL LOW (ref 12.0–15.0)
MCH: 31.3 pg (ref 26.0–34.0)
MCHC: 32.1 g/dL (ref 30.0–36.0)
MCV: 97.4 fL (ref 78.0–100.0)
PLATELETS: 36 10*3/uL — AB (ref 150–400)
RBC: 3.13 MIL/uL — AB (ref 3.87–5.11)
RDW: 16.2 % — ABNORMAL HIGH (ref 11.5–15.5)
WBC: 17.9 10*3/uL — AB (ref 4.0–10.5)

## 2017-11-19 LAB — APTT
APTT: 105 s — AB (ref 24–36)
APTT: 64 s — AB (ref 24–36)
APTT: 48 s — AB (ref 24–36)
aPTT: 44 seconds — ABNORMAL HIGH (ref 24–36)
aPTT: 45 seconds — ABNORMAL HIGH (ref 24–36)
aPTT: 33 seconds (ref 24–36)

## 2017-11-19 LAB — PROTIME-INR
INR: 3.08
Prothrombin Time: 31.5 seconds — ABNORMAL HIGH (ref 11.4–15.2)

## 2017-11-19 LAB — CULTURE, RESPIRATORY W GRAM STAIN

## 2017-11-19 LAB — PHOSPHORUS: PHOSPHORUS: 2.5 mg/dL (ref 2.5–4.6)

## 2017-11-19 LAB — CULTURE, RESPIRATORY

## 2017-11-19 LAB — C DIFFICILE QUICK SCREEN W PCR REFLEX
C DIFFICILE (CDIFF) INTERP: NOT DETECTED
C DIFFICILE (CDIFF) TOXIN: NEGATIVE
C Diff antigen: NEGATIVE

## 2017-11-19 LAB — CK: Total CK: 186 U/L (ref 38–234)

## 2017-11-19 MED ORDER — BIVALIRUDIN TRIFLUOROACETATE 250 MG IV SOLR
0.0600 mg/kg/h | INTRAVENOUS | Status: DC
  Filled 2017-11-19: qty 250

## 2017-11-19 MED ORDER — SODIUM CHLORIDE 0.9 % IV SOLN
0.0600 mg/kg/h | INTRAVENOUS | Status: DC
  Administered 2017-11-19 (×2): 0.03 mg/kg/h via INTRAVENOUS
  Filled 2017-11-19 (×2): qty 250

## 2017-11-19 MED ORDER — LACTULOSE 10 GM/15ML PO SOLN
20.0000 g | Freq: Every day | ORAL | Status: DC
  Administered 2017-11-19 – 2017-11-21 (×3): 20 g
  Filled 2017-11-19 (×2): qty 30

## 2017-11-19 MED ORDER — POTASSIUM CHLORIDE 10 MEQ/50ML IV SOLN
10.0000 meq | INTRAVENOUS | Status: AC
  Administered 2017-11-19 (×4): 10 meq via INTRAVENOUS
  Filled 2017-11-19 (×4): qty 50

## 2017-11-19 MED ORDER — CEFAZOLIN SODIUM-DEXTROSE 2-4 GM/100ML-% IV SOLN
2.0000 g | Freq: Three times a day (TID) | INTRAVENOUS | Status: DC
  Administered 2017-11-19 – 2017-11-21 (×6): 2 g via INTRAVENOUS
  Filled 2017-11-19 (×7): qty 100

## 2017-11-19 NOTE — Progress Notes (Signed)
Called to bedside by RN due to increased WOB. Upon arrival ETT found at 18 at lip. Deflated cuff and placed back at 23. Dr. Vassie Loll paged and ordered stat portable chest x-ray. Breath sounds diminished but equal with positive color change. Awaiting x-ray.

## 2017-11-19 NOTE — Plan of Care (Signed)
  Progressing Nutrition: Adequate nutrition will be maintained 11/19/2017 1543 - Progressing by Karren Burly, RN Elimination: Will not experience complications related to bowel motility 11/19/2017 1543 - Progressing by Karren Burly, RN Pain Managment: General experience of comfort will improve 11/19/2017 1543 - Progressing by Karren Burly, RN   Not Progressing Clinical Measurements: Respiratory complications will improve 11/19/2017 1543 - Not Progressing by Karren Burly, RN Cardiovascular complication will be avoided 11/19/2017 1543 - Not Progressing by Karren Burly, RN Activity: Risk for activity intolerance will decrease 11/19/2017 1543 - Not Progressing by Karren Burly, RN Coping: Level of anxiety will decrease 11/19/2017 1543 - Not Progressing by Karren Burly, RN Elimination: Will not experience complications related to urinary retention 11/19/2017 1543 - Not Progressing by Karren Burly, RN

## 2017-11-19 NOTE — Progress Notes (Signed)
ANTICOAGULATION CONSULT NOTE - Follow Up Consult  Pharmacy Consult for bivalirudin Indication: possible LV thrombus, r/o HIT  Allergies  Allergen Reactions  . Flexeril [Cyclobenzaprine] Hives  . Heparin Other (See Comments)    Ruling out HIT, Heparin ab sent 2/26. If negative will need to delete allergy.  . Amoxicillin Hives and Rash    Has patient had a PCN reaction causing immediate rash, facial/tongue/throat swelling, SOB or lightheadedness with hypotension: Yes Has patient had a PCN reaction causing severe rash involving mucus membranes or skin necrosis: Yes Has patient had a PCN reaction that required hospitalization No Has patient had a PCN reaction occurring within the last 10 years: Yes If all of the above answers are "NO", then may proceed with Cephalosporin use.     Patient Measurements: Height: 5\' 4"  (162.6 cm) Weight: 199 lb 8.3 oz (90.5 kg) IBW/kg (Calculated) : 54.7  Vital Signs: Temp: 99 F (37.2 C) (02/27 1124) Temp Source: Oral (02/27 1124) BP: 122/73 (02/27 1600) Pulse Rate: 103 (02/27 1600)  Labs: Recent Labs    11/17/17 0411 11/18/17 0438 11/18/17 0957  11/19/17 0301 11/19/17 0645 11/19/17 0947 11/19/17 1118 11/19/17 1508  HGB 13.9 11.3*  --   --  9.8*  --   --   --   --   HCT 44.6 36.1  --   --  30.5*  --   --   --   --   PLT 54* 41*  --   --  36*  --   --   --   --   APTT  --   --   --    < >  --  64* 48*  --  44*  LABPROT  --   --   --   --  31.5*  --   --   --   --   INR  --   --   --   --  3.08  --   --   --   --   CREATININE 0.87 0.74  --   --  0.69  --   --   --  0.64  CKTOTAL  --   --  125  --   --   --   --  186  --    < > = values in this interval not displayed.    Estimated Creatinine Clearance: 90.6 mL/min (by C-G formula based on SCr of 0.64 mg/dL).   Assessment: 52 yo w/ possible LV thrombus, IV heparin changed to bivalirudin for possible HIT.  HIT antibody 0.35 on 2/26, repeat pending.  Platelet count still low, but may be  stabilizing. No overt bleeding or complications noted.  IV team able to start peripheral IV today, so bivalirudin infusion now moved to PIV. A repeat aPTT drawn with bivalirudin running peripherally at 0.03mg /kg/hr is below goal at 44 seconds.  Goal of Therapy:  aPTT 50-85 seconds Monitor platelets by anticoagulation protocol: Yes   Plan:  -Increase bivalirudin to 0.038mg /kg/hr (a little more than 20% increase) -Confirm PTT in 2 hrs. -Daily aPTT. -F/u repeat HIT antibody and need for SRA  Mckaylin Bastien D. Estephani Popper, PharmD, BCPS Clinical Pharmacist 11/19/2017 4:37 PM

## 2017-11-19 NOTE — Progress Notes (Signed)
Updated patient's brother [name] and [facility] POA. Explained that we do not have a clear diagnosis for her neurological situation.  MRI cannot be performed since she has a pacemaker and LP carries a risk since she has low platelets and is on anticoagulation for LV thrombus. He indicated that family has discussed in detail and they would be ready for one-way extubation by Friday.  He is okay with withholding aggressive testing. DNR has been issued already  [employer] Vassie Loll MD

## 2017-11-19 NOTE — Progress Notes (Signed)
ANTICOAGULATION CONSULT NOTE - Follow Up Consult  Pharmacy Consult for bivalirudin Indication: possible LV thrombus, r/o HIT  Allergies  Allergen Reactions  . Flexeril [Cyclobenzaprine] Hives  . Heparin Other (See Comments)    Ruling out HIT, Heparin ab sent 2/26. If negative will need to delete allergy.  . Amoxicillin Hives and Rash    Has patient had a PCN reaction causing immediate rash, facial/tongue/throat swelling, SOB or lightheadedness with hypotension: Yes Has patient had a PCN reaction causing severe rash involving mucus membranes or skin necrosis: Yes Has patient had a PCN reaction that required hospitalization No Has patient had a PCN reaction occurring within the last 10 years: Yes If all of the above answers are "NO", then may proceed with Cephalosporin use.     Patient Measurements: Height: 5\' 4"  (162.6 cm) Weight: 199 lb 8.3 oz (90.5 kg) IBW/kg (Calculated) : 54.7  Vital Signs: Temp: 99 F (37.2 C) (02/27 1124) Temp Source: Oral (02/27 1124) BP: 103/63 (02/27 1100) Pulse Rate: 109 (02/27 1100)  Labs: Recent Labs    11/17/17 0411 11/18/17 4917 11/18/17 0957  11/19/17 0256 11/19/17 0301 11/19/17 0645 11/19/17 0947  HGB 13.9 11.3*  --   --   --  9.8*  --   --   HCT 44.6 36.1  --   --   --  30.5*  --   --   PLT 54* 41*  --   --   --  36*  --   --   APTT  --   --   --    < > 105*  --  64* 48*  LABPROT  --   --   --   --   --  31.5*  --   --   INR  --   --   --   --   --  3.08  --   --   CREATININE 0.87 0.74  --   --   --  0.69  --   --   CKTOTAL  --   --  125  --   --   --   --   --    < > = values in this interval not displayed.    Estimated Creatinine Clearance: 90.6 mL/min (by C-G formula based on SCr of 0.69 mg/dL).   Medications:  Infusions:  . sodium chloride    . sodium chloride 250 mL (11/17/17 2100)  . sodium chloride    . bivalirudin (ANGIOMAX) infusion 0.5 mg/mL (Non-ACS indications) 0.03 mg/kg/hr (11/19/17 0600)  .  ceFAZolin (ANCEF)  IV    . dextrose 50 mL/hr at 11/19/17 1200  . feeding supplement (VITAL AF 1.2 CAL) 1,000 mL (11/19/17 0600)  . insulin (NOVOLIN-R) infusion 13.5 Units/hr (11/19/17 1200)    Assessment: 52 yo w/ possible LV thrombus, IV heparin changed to bivalirudin for possible HIT.  HIT antibody pending.  Platelet count still low, but may be stablizing?  No overt bleeding or complications noted.  APTT down to 48 at recheck, just very slightly below goal.  Some issues with collection of PTT since bivalirudin is infusing in central line.  RN pausing gtt, then flushing, then collecting.  IV team able to start peripheral IV today, so bivalirudin infusion now moved to PIV.  Goal of Therapy:  aPTT 50-85 seconds Monitor platelets by anticoagulation protocol: Yes   Plan:  1. Continue bivalirudin at current rate. 2. Confirm PTT in 4 hrs. 3. Daily PTT. 4. F/u HIT antibody.  Tad Moore, BCPS  Clinical Pharmacist Pager [phone]  11/19/2017 12:17 PM

## 2017-11-19 NOTE — Progress Notes (Signed)
ANTICOAGULATION CONSULT NOTE - Follow Up Consult  Pharmacy Consult for bivalirudin Indication: ACS and LV thrombus in setting of possible HIT  Labs: Recent Labs    11/17/17 0411 11/18/17 0438 11/18/17 0957  11/19/17 0301  11/19/17 0947 11/19/17 1118 11/19/17 1508 11/19/17 1921  HGB 13.9 11.3*  --   --  9.8*  --   --   --   --   --   HCT 44.6 36.1  --   --  30.5*  --   --   --   --   --   PLT 54* 41*  --   --  36*  --   --   --   --   --   APTT  --   --   --    < >  --    < > 48*  --  44* 45*  LABPROT  --   --   --   --  31.5*  --   --   --   --   --   INR  --   --   --   --  3.08  --   --   --   --   --   CREATININE 0.87 0.74  --   --  0.69  --   --   --  0.64  --   CKTOTAL  --   --  125  --   --   --   --  186  --   --    < > = values in this interval not displayed.    Assessment: 52yo female on bivalirudin for possible HIT. APTT remains subtherapeutic at 45 after rate increase this afternoon.   Goal of Therapy:  aPTT 50-85 seconds   Plan:  Increase infusion rate by 20% to 0.046 mg/kg/hr (7.8 ml/hr) Recheck at 2300  Bayard Hugger, PharmD, BCPS  Clinical Pharmacist  Pager: [phone]  11/19/2017,8:50 PM

## 2017-11-19 NOTE — Progress Notes (Signed)
CRITICAL VALUE ALERT  Critical Value:167 Date & Time Notied:  11/19/2017 00:03  Provider Notified: Orlin Hilding   Orders Received/Actions taken: Hold APTT,

## 2017-11-19 NOTE — Progress Notes (Addendum)
ANTICOAGULATION CONSULT NOTE - Follow Up Consult  Pharmacy Consult for bivalirudin Indication: ACS and LV thrombus in setting of possible HIT  Labs: Recent Labs    11/17/17 0411 11/18/17 0438 11/18/17 0957  11/19/17 0301  11/19/17 1118 11/19/17 1508 11/19/17 1921 11/19/17 2314  HGB 13.9 11.3*  --   --  9.8*  --   --   --   --   --   HCT 44.6 36.1  --   --  30.5*  --   --   --   --   --   PLT 54* 41*  --   --  36*  --   --   --   --   --   APTT  --   --   --    < >  --    < >  --  44* 45* 33  LABPROT  --   --   --   --  31.5*  --   --   --   --   --   INR  --   --   --   --  3.08  --   --   --   --   --   CREATININE 0.87 0.74  --   --  0.69  --   --  0.64  --   --   CKTOTAL  --   --  125  --   --   --  186  --   --   --    < > = values in this interval not displayed.    Assessment: 52yo female now with lower aPTT on bivalirudin despite rate increase; RN notes no gtt issues or overt signs of bleeding; RN notes that gtt is now running peripherally and labs are being drawn from central line.  Goal of Therapy:  aPTT 50-85 seconds   Plan:  Will increase bivalirudin gtt by 25% and check PTT in 2h.  Vernard Gambles, PharmD, BCPS  11/19/2017,11:54 PM   Addendum: Next PTT remains below goal (36).  Will increase bivalirudin gtt by 20% and check PTT in 2h.    VB 11/20/2017 3:46 AM

## 2017-11-19 NOTE — Progress Notes (Addendum)
Subjective: No change since yesterday  Exam: Vitals:   11/19/17 1100 11/19/17 1124  BP: 103/63   Pulse: (!) 109   Resp: (!) 27   Temp:  99 F (37.2 C)  SpO2: 95%     Physical Exam   HEENT-  Normocephalic, no lesions, without obvious abnormality.  Normal external eye and conjunctiva.   Cardiovascular- S1-S2 audible, pulses palpable throughout   Lungs-no rhonchi or wheezing noted, no excessive working breathing.  Saturations within normal limits Abdomen- All 4 quadrants palpated and nontender Extremities- Warm, dry and intact Musculoskeletal-no joint tenderness, deformity or swelling Skin-warm and dry, no hyperpigmentation, vitiligo, or suspicious lesions    Neuro: Mental Status: Patient is intubated at current time with breathing over the vent.  Patient does follow commands looking left to right at one point however after that one time I did not get her to do it again.  He needs to have flaccid extremities and cannot follow commands in her extremities Cranial Nerves: II: Blinks to threat III,IV, VI: Opens eyes, able to look left and right however I could not get any vertical movement of her eyes.  Corneals intact pupillary's approximately 4 mm and reactive bilaterally V,VII: Face symmetric, facial light touch sensation normal bilaterally VIII: Opens eyes to voice I Motor: Flaccid throughout Sensory: Winces to pain to noxious stimuli Deep Tendon Reflexes: No reflexes noted Plantars: Mute bilaterally    Medications:  Scheduled: . atorvastatin  80 mg Oral q1800  . budesonide (PULMICORT) nebulizer solution  0.5 mg Nebulization BID  . chlorhexidine gluconate (MEDLINE KIT)  15 mL Mouth Rinse BID  . Chlorhexidine Gluconate Cloth  6 each Topical Daily  . collagenase   Topical Daily  . feeding supplement (PRO-STAT SUGAR FREE 64)  30 mL Per Tube BID  . free water  200 mL Per Tube Q4H  . ipratropium  0.5 mg Nebulization Q6H  . lactulose  20 g Per Tube Daily  . levalbuterol   0.63 mg Nebulization Q6H  . mouth rinse  15 mL Mouth Rinse 10 times per day  . pantoprazole sodium  40 mg Per Tube QHS  . sodium chloride flush  10-40 mL Intracatheter Q12H  . 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  She also found to have an ejection fraction of 30% on echocardiogram.  CT of head was obtained and did not show any abnormalities.  CTA of head and neck did not show any large vessel occlusion.  D/D include CVA--not seen on CT as this could be a shower of emboli, Spinal infarction, AIDP, critical illness myopathy,metabolic and toxic encephalopathy   Recommendations: Unfortunately due to pacemaker cannot obtain MRI brain and C spine.  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   Will continue to follow. AS stated no change in exam today   11/19/2017, 12:54 PM    NEUROHOSPITALIST ADDENDUM Seen and examined the patient today. No clinical improvement in examination.   Unfortunately we are severely limited by ability to further work cause of encephalopathy and weakness. MRI not able to be done and LP also unable to be performed. Patient is very sick from systemic standpoint as well  with heart failure, LV thrombus.     Karena Addison Aroor MD [facility] [phone]  If 7pm to 7am, please call on call as listed on AMION.

## 2017-11-19 NOTE — Progress Notes (Signed)
Pharmacy Antibiotic Note  [name] is a 52 y.o. female admitted on 10/29/2017 with MSSA pneumonia.  Pharmacy has been consulted for cefazolin dosing.  Dr. Vassie Loll aware of penicillin allergy.  Pt tolerated cefepime.  Will proceed with caution.  Plan: 1. Start cefazolin 2 g IV q 8 hrs. 2. F/u renal function, clinical course.  Height: 5\' 4"  (162.6 cm) Weight: 199 lb 8.3 oz (90.5 kg) IBW/kg (Calculated) : 54.7  Temp (24hrs), Avg:99.8 F (37.7 C), Min:98.7 F (37.1 C), Max:101.5 F (38.6 C)  Recent Labs  Lab 11/15/17 0457 11/17/2017 1019 10/27/2017 1253 11/17/17 0411 11/18/17 0438 11/18/17 0957 11/19/17 0301  WBC 27.5* 26.0*  --  25.9* 24.1*  --  17.9*  CREATININE 0.84 1.15*  --  0.87 0.74  --  0.69  LATICACIDVEN  --  1.1 1.1  --   --   --   --   VANCOTROUGH  --   --   --   --   --  16  --     Estimated Creatinine Clearance: 90.6 mL/min (by C-G formula based on SCr of 0.69 mg/dL).    Allergies  Allergen Reactions  . Flexeril [Cyclobenzaprine] Hives  . Heparin Other (See Comments)    Ruling out HIT, Heparin ab sent 2/26. If negative will need to delete allergy.  . Amoxicillin Hives and Rash    Has patient had a PCN reaction causing immediate rash, facial/tongue/throat swelling, SOB or lightheadedness with hypotension: Yes Has patient had a PCN reaction causing severe rash involving mucus membranes or skin necrosis: Yes Has patient had a PCN reaction that required hospitalization No Has patient had a PCN reaction occurring within the last 10 years: Yes If all of the above answers are "NO", then may proceed with Cephalosporin use.     Antimicrobials this admission: Cefazolin 2/27 >  Meropenem 2/24 >>2/27 Vanc 2/11 >> 2/14; 2/24 >>2/26 Levaquin 2/11 >> 2/14 Cefepime 2/12 >>2/20 Tamiflu 2/11>>2/16  Dose adjustments this admission:   Microbiology results: 2/11 BCx: ng final 2/12 MRSA PCR:  negative 2/12 influenza + on tamiflu 2/24 bldx2: ngtd 2/24 urine: neg 2/24  resp: MSSA 2/26 Cdiff neg  Thank you for allowing pharmacy to be a part of this patient's care.  Tad Moore, BCPS  Clinical Pharmacist Pager [phone]  11/19/2017 10:03 AM

## 2017-11-19 NOTE — Progress Notes (Signed)
PULMONARY / CRITICAL CARE MEDICINE   Name: [name] MRN: [id number] DOB: 01/24/1966    ADMISSION DATE:  11/18/2017  CHIEF COMPLAINT:  Respiratory failure  HISTORY OF PRESENT ILLNESS:       This is a chronically ill 52 year old who presented to [facility] on 2/11 complaining of dyspnea nausea vomiting and diarrhea.  She was found to be influenza positive and required intubation and mechanical ventilation.  She was intubated until 2/21 and when she was extubated she was encephalopathic.  In addition she had an elevation of her cardiac enzymes and an echocardiogram was obtained showing an ejection fraction of 30% felt to be consistent with a Takotsubo syndrome.  She subsequently developed diffuse ST segment elevation with most pronounced elevation inferiorly were Q waves are present.  She was transferred to this hospital and on arrival required intubation for respiratory distress.  She was taken to the cardiac catheterization laboratory where she was found to have diffuse disease not requiring intervention.  Of note her LVEDP was normal at 11.  She was transferred to the intensive care unit where she is unresponsive despite receiving no sedative agents. She receives an anti-TNF agent for Crohn's disease.  She also appears to have chronic lung disease for which she is O2 dependent at home and she has a history of pacer placement.     ETT 2/11 >> 21, 2/24 >>  STUDIES:  2/22 head CT neg 2/24 head CT neg 2/24 [place] abd >> prior chole, no duct dilatation   CULTURES: 2/26 C diff neg 2/24  sputum > MSSA 2/24 blood >>ng 2/24 urine >ng  ANTIBIOTICS: 2/24 Vancomycin >> 2/26 2/24 meropenem >> 2/27 Ancef 2/27 >>     SUBJECTIVE:  Afebrile Remains intubated, off sedation   VITAL SIGNS: BP 110/75 (BP Location: Left Arm)   Pulse (!) 104   Temp 98.9 F (37.2 C) (Oral)   Resp (!) 23   Ht 5\' 4"  (1.626 m)   Wt 199 lb 8.3 oz (90.5 kg)   SpO2 98%   BMI 34.25 kg/m    HEMODYNAMICS: CVP:  [1 mmHg-5 mmHg] 1 mmHg  VENTILATOR SETTINGS: Vent Mode: PSV;CPAP FiO2 (%):  [40 %] 40 % Set Rate:  [14 bmp] 14 bmp Vt Set:  [440 mL] 440 mL PEEP:  [5 cmH20] 5 cmH20 Pressure Support:  [5 cmH20-12 cmH20] 5 cmH20 Plateau Pressure:  [16 cmH20-18 cmH20] 18 cmH20  INTAKE / OUTPUT: I/O last 3 completed shifts: In: 5647.5 [I.V.:2387.5; NG/GT:2300; IV Piggyback:960] Out: 4490 [Urine:2450; Emesis/NG output:100; Stool:1940]  PHYSICAL EXAMINATION: General: older than stated age ,orally intubated and mechanically ventilated  Neuro: opens eyes to name & tracks,  does not follow commands,pupils BEERTL Limbs are flaccid and she is areflexic Cardiovascular: S1 and S2 are regular and rapid without murmur rub or gallop.  Lungs:no acc muscle use, there is symmetric air movement, decreased BS BL Abdomen: soft and nontender    LABS:  BMET Recent Labs  Lab 11/17/17 0411 11/18/17 0438 11/19/17 0301  NA 152* 154* 150*  K 4.1 3.0* 3.2*  CL 123* 126* 124*  CO2 22 20* 18*  BUN 58* 46* 37*  CREATININE 0.87 0.74 0.69  GLUCOSE 192* 328* 307*    Electrolytes Recent Labs  Lab 11/17/17 0411 11/18/17 0438 11/19/17 0301  CALCIUM 8.7* 8.1* 7.9*  MG 2.5* 2.2  --   PHOS 3.2 1.3* 2.5    CBC Recent Labs  Lab 11/17/17 0411 11/18/17 0438 11/19/17 0301  WBC 25.9*  24.1* 17.9*  HGB 13.9 11.3* 9.8*  HCT 44.6 36.1 30.5*  PLT 54* 41* 36*    Coag's Recent Labs  Lab 10/28/2017 1019  11/18/17 2251 11/19/17 0256 11/19/17 0301 11/19/17 0645  APTT  --    < > 167* 105*  --  64*  INR 1.38  --   --   --  3.08  --    < > = values in this interval not displayed.    Sepsis Markers Recent Labs  Lab 11/09/2017 1019 10/25/2017 1253 11/17/17 0411 11/18/17 0438  LATICACIDVEN 1.1 1.1  --   --   PROCALCITON 1.95  --  1.36 0.66    ABG Recent Labs  Lab 11/08/2017 0653 11/14/2017 0930 11/17/17 0650  PHART 7.293* 7.298* 7.396  PCO2ART 40.3 37.9 39.8  PO2ART 161.0* 86.0 86.1     Liver Enzymes Recent Labs  Lab 11/07/2017 1019 11/18/17 0438 11/19/17 0301  AST 110* 52* 37  ALT 192* 103* 75*  ALKPHOS 449* 300* 251*  BILITOT 0.9 0.8 0.6  ALBUMIN 2.3* 2.0* 2.0*    Cardiac Enzymes Recent Labs  Lab 10/27/2017 0429  TROPONINI 0.81*    Glucose Recent Labs  Lab 11/19/17 0354 11/19/17 0447 11/19/17 0546 11/19/17 0655 11/19/17 0759 11/19/17 0905  GLUCAP 123* 103* 168* 184* 102* 219*    Imaging No results found.    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  She was admitted with respiratory failure and influenza at [facility] subsequently developed type II cardiac ischemia with a Takotsubo appearance on echo.  She was transferred to [facility] due to diffuse ST segment elevations and is found to have diffuse coronary disease not requiring interventions with an EF estimated at 30% and a normal LVEDP.  She  required reintubation and has NEW flaccid quadriparesis of unclear etiology  ASSESSMENT / PLAN:  PULMONARY A: Acute resp failure  -SBTs  -plan would be for one way extubation if neuro exam improves or in 1-2 days, no Tstomy per family discussion - ct duonebs  CARDIOVASCULAR A: CAD Cardiomyopathy ? Takatsubo's , HF rEF SSS s/p PPM LV thrombus  - COreg, ASA, plavix, lipitor -ct bivalirudin (no heparin)   RENAL A: Hypernatremia  -  Ct D5W  Hypokalemia/severe hypophos -repleting  GASTROINTESTINAL A: Abnormal LFTs, slight high ammonia level 59 Crohn's disease abd [place] neg Diarrhea on lactulose  -ct TFs -follow LFTs intermittent - Decrease lactulose to once daily, recheck ammonia  HEMATOLOGIC A:  Thrombocytopenia -Await HIT panel -AVOID heparin   INFECTIOUS A: Influenza pneumonia RLL HCAP -MSSA  - dc vancomycin and meropenem -start ancef   ENDOCRINE A: DM-2 , uncontrolled , on D5 drip  Use insulin gtt, Once Na  controlled, can stop D5W & then epxect to transition to SSI with lantus   NEUROLOGIC A: Acute encephalopathy - head CT neg, ammonia slight high, NO clear episode of hypoxia/ hypotension documented  -Neuro input , ideally needs MRI but cannot since has PPM -lactulose once daily for now  Family GOC discussion - 2/26 with palliative care , no Tstomy/ PEG, DNR issued , will extubate one way once maximum clinical improvement  The patient is critically ill with multiple organ systems failure and requires high complexity decision making for assessment and support, frequent evaluation and titration of therapies, application of advanced monitoring technologies and extensive interpretation of multiple databases. Critical  Care Time devoted to patient care services described in this note independent of APP/resident  time is 35 minutes.   Cyril Mourning MD. Tonny Bollman. [facility] Pager [phone] If no response call [phone]     11/19/2017, 9:41 AM

## 2017-11-19 NOTE — Progress Notes (Addendum)
ANTICOAGULATION CONSULT NOTE - Follow Up Consult  Pharmacy Consult for bivalirudin Indication: ACS and LV thrombus in setting of possible HIT  Labs: Recent Labs    11/02/2017 0429  10/27/2017 1019 11/17/17 0411 11/18/17 0438 11/18/17 0957 11/18/17 1200 11/18/17 1420 11/18/17 2251  HGB  --    < > 15.2* 13.9 11.3*  --   --   --   --   HCT  --   --  47.1* 44.6 36.1  --   --   --   --   PLT  --   --  67* 54* 41*  --   --   --   --   APTT  --   --   --   --   --   --  43* 91* 167*  LABPROT  --   --  16.8*  --   --   --   --   --   --   INR  --   --  1.38  --   --   --   --   --   --   CREATININE  --   --  1.15* 0.87 0.74  --   --   --   --   CKTOTAL  --   --   --   --   --  125  --   --   --   TROPONINI 0.81*  --   --   --   --   --   --   --   --    < > = values in this interval not displayed.    Assessment: 52yo female now with much higher aPTT on bivalirudin despite rate decrease; RN notes no gtt issues or overt signs of bleeding.  Goal of Therapy:  aPTT 50-85 seconds   Plan:  Will hold bivalirudin gtt x36min then resume gtt at 50% reduced rate and check PTT 2h after resuming.  Vernard Gambles, PharmD, BCPS  11/19/2017,12:11 AM   Addendum: Next PTT remains above goal (105).  Will hold bivalirudin gtt x44min then resume gtt at 50% reduced rate and check PTT 2h after resuming.   VB  3:48 AM

## 2017-11-19 NOTE — Progress Notes (Addendum)
ANTICOAGULATION CONSULT NOTE - Follow Up Consult  Pharmacy Consult for bivalirudin Indication: possible LV thrombus, r/o HIT  Allergies  Allergen Reactions  . Flexeril [Cyclobenzaprine] Hives  . Heparin Other (See Comments)    Ruling out HIT, Heparin ab sent 2/26. If negative will need to delete allergy.  . Amoxicillin Hives and Rash    Has patient had a PCN reaction causing immediate rash, facial/tongue/throat swelling, SOB or lightheadedness with hypotension: Yes Has patient had a PCN reaction causing severe rash involving mucus membranes or skin necrosis: Yes Has patient had a PCN reaction that required hospitalization No Has patient had a PCN reaction occurring within the last 10 years: Yes If all of the above answers are "NO", then may proceed with Cephalosporin use.     Patient Measurements: Height: 5\' 4"  (162.6 cm) Weight: 199 lb 8.3 oz (90.5 kg) IBW/kg (Calculated) : 54.7  Vital Signs: Temp: 98.9 F (37.2 C) (02/27 0754) Temp Source: Oral (02/27 0754) BP: 113/79 (02/27 0600) Pulse Rate: 101 (02/27 0600)  Labs: Recent Labs    2017-11-18 1019 11/17/17 0411 11/18/17 0438 11/18/17 0957  11/18/17 2251 11/19/17 0256 11/19/17 0301 11/19/17 0645  HGB 15.2* 13.9 11.3*  --   --   --   --  9.8*  --   HCT 47.1* 44.6 36.1  --   --   --   --  30.5*  --   PLT 67* 54* 41*  --   --   --   --  36*  --   APTT  --   --   --   --    < > 167* 105*  --  64*  LABPROT 16.8*  --   --   --   --   --   --  31.5*  --   INR 1.38  --   --   --   --   --   --  3.08  --   CREATININE 1.15* 0.87 0.74  --   --   --   --  0.69  --   CKTOTAL  --   --   --  125  --   --   --   --   --    < > = values in this interval not displayed.    Estimated Creatinine Clearance: 90.6 mL/min (by C-G formula based on SCr of 0.69 mg/dL).   Medications:  Infusions:  . sodium chloride    . sodium chloride 250 mL (11/17/17 2100)  . sodium chloride    . bivalirudin (ANGIOMAX) infusion 0.5 mg/mL (Non-ACS  indications) 0.03 mg/kg/hr (11/19/17 0600)  . dextrose 50 mL/hr at 11/19/17 0600  . feeding supplement (VITAL AF 1.2 CAL) 1,000 mL (11/19/17 0600)  . insulin (NOVOLIN-R) infusion 1.8 Units/hr (11/19/17 0800)  . meropenem (MERREM) IV Stopped (11/19/17 0532)  . potassium chloride 10 mEq (11/19/17 0801)    Assessment: 52 yo w/ possible LV thrombus, IV heparin changed to bivalirudin for possible HIT.  HIT antibody pending.  Platelet count still low, but may be stablizing?  No overt bleeding or complications noted.  APTT at goal this AM at 64.  INR falsely elevated from bivalirudin infusion.  Goal of Therapy:  aPTT 50-85 seconds Monitor platelets by anticoagulation protocol: Yes   Plan:  1. Continue bivalirudin at current rate. 2. Confirm PTT in 2 hrs. 3. Daily PTT. 4. F/u HIT antibody.  Tad Moore, BCPS  Clinical Pharmacist Pager [phone]  11/19/2017 8:22 AM

## 2017-11-19 NOTE — Progress Notes (Signed)
eLink Physician-Brief Progress Note Patient Name: [name] DOB: 01-11-1966 MRN: [id number]   Date of Service  11/19/2017  HPI/Events of Note  K+ = 3.2 and Creatinine = 0.69.   eICU Interventions  Will replace K+.      Intervention Category Major Interventions: Electrolyte abnormality - evaluation and management  Osie Amparo [place] 11/19/2017, 6:27 AM

## 2017-11-19 NOTE — Progress Notes (Addendum)
Progress Note  Patient Name: [name] Date of Encounter: 11/19/2017 Primary Cardiologist: No primary care provider on file.   Subjective   Remains encephalopathic with flacid paralysis and unresponsive off sedation despite negative head CT. Still on vent.   Wirt discussion yesterday: partial code allowing for temporary continuation of ventilator support. Family wants no trach or PEG. Will continue current medical therapy with plan for one-way extubation over the next several days.   Platelets continue to fall and heparin DC due to c/o HIT. HIT panel pending. On Bivalirudin.   Inpatient Medications    Scheduled Meds: . atorvastatin  80 mg Oral q1800  . budesonide (PULMICORT) nebulizer solution  0.5 mg Nebulization BID  . chlorhexidine gluconate (MEDLINE KIT)  15 mL Mouth Rinse BID  . Chlorhexidine Gluconate Cloth  6 each Topical Daily  . collagenase   Topical Daily  . feeding supplement (PRO-STAT SUGAR FREE 64)  30 mL Per Tube BID  . free water  200 mL Per Tube Q4H  . ipratropium  0.5 mg Nebulization Q6H  . lactulose  20 g Per Tube Daily  . levalbuterol  0.63 mg Nebulization Q6H  . mouth rinse  15 mL Mouth Rinse 10 times per day  . pantoprazole sodium  40 mg Per Tube QHS  . sodium chloride flush  10-40 mL Intracatheter Q12H  . sodium chloride flush  3 mL Intravenous Q12H   Continuous Infusions: . sodium chloride    . sodium chloride 250 mL (11/17/17 2100)  . sodium chloride    . bivalirudin (ANGIOMAX) infusion 0.5 mg/mL (Non-ACS indications) 0.03 mg/kg/hr (11/19/17 0600)  . dextrose 50 mL/hr at 11/19/17 0600  . feeding supplement (VITAL AF 1.2 CAL) 1,000 mL (11/19/17 0600)  . insulin (NOVOLIN-R) infusion 8.3 Units/hr (11/19/17 0906)  . potassium chloride 10 mEq (11/19/17 0907)   PRN Meds: sodium chloride, sodium chloride, Place/Maintain arterial line **AND** sodium chloride, acetaminophen (TYLENOL) oral liquid 160 mg/5 mL, acetaminophen **OR** acetaminophen, fentaNYL  (SUBLIMAZE) injection, levalbuterol, LORazepam, sodium chloride flush, sodium chloride flush   Vital Signs    Vitals:   11/19/17 0700 11/19/17 0748 11/19/17 0754 11/19/17 0800  BP: 102/71   110/75  Pulse: 97   (!) 104  Resp: (!) 27 (!) 27  (!) 23  Temp:   98.9 F (37.2 C)   TempSrc:   Oral   SpO2: 96% 97%  98%  Weight:      Height:        Intake/Output Summary (Last 24 hours) at 11/19/2017 0946 Last data filed at 11/19/2017 0907 Gross per 24 hour  Intake 3933.74 ml  Output 3775 ml  Net 158.74 ml   Filed Weights   11/17/17 0400 11/18/17 0351 11/19/17 0200  Weight: 188 lb 7.9 oz (85.5 kg) 186 lb 1.1 oz (84.4 kg) 199 lb 8.3 oz (90.5 kg)    Telemetry    Persistent tachycardia - Personally Reviewed  ECG    10/31/2017 @ 4:19 AM Tachy 130's, diffuse ST elevations  - Personally Reviewed  Physical Exam   GEN: Intubated, mechanically ventilated. Does not follow commands. Tracks across room. Cardiac: tachycardic, otherwise regular.   Respiratory: Diminished bs BL. Symmetric air movement. Breathing unlabored on vent. MS: No significant edema; No gross deformity. Abdomen: Obese, soft. No grimace on palpation Neuro: Opens eyes to name. Does not FC. Limbs flaccid.   Labs    Chemistry Recent Labs  Lab 10/26/2017 1019 11/17/17 0411 11/18/17 0438 11/19/17 0301  NA 155* 152*  154* 150*  K 4.0 4.1 3.0* 3.2*  CL 125* 123* 126* 124*  CO2 21* 22 20* 18*  GLUCOSE 139* 192* 328* 307*  BUN 92* 58* 46* 37*  CREATININE 1.15* 0.87 0.74 0.69  CALCIUM 8.2* 8.7* 8.1* 7.9*  PROT 5.7*  --  4.6* 4.5*  ALBUMIN 2.3*  --  2.0* 2.0*  AST 110*  --  52* 37  ALT 192*  --  103* 75*  ALKPHOS 449*  --  300* 251*  BILITOT 0.9  --  0.8 0.6  GFRNONAA 54* >60 >60 >60  GFRAA >60 >60 >60 >60  ANIONGAP _0 Hematology Recent Labs  Lab 11/17/17 0411 11/18/17 0438 11/19/17 0301  WBC 25.9* 24.1* 17.9*  RBC 4.61 3.72* 3.13*  HGB 13.9 11.3* 9.8*  HCT 44.6 36.1 30.5*  MCV 96.7 97.0 97.4    MCH 30.2 30.4 31.3  MCHC 31.2 31.3 32.1  RDW 15.8* 15.8* 16.2*  PLT 54* 41* 36*    Cardiac Enzymes Recent Labs  Lab 11/04/2017 0429  TROPONINI 0.81*   No results for input(s): TROPIPOC in the last 168 hours.   BNPNo results for input(s): BNP, PROBNP in the last 168 hours.   DDimer No results for input(s): DDIMER in the last 168 hours.   Radiology    Ct Angio Neck W Or Wo Contrast - Result Date: 11/17/2017 - CLINICAL DATA:  52 year old female with recent altered mental status and now worsening neuro deficit. IMPRESSION: 1. Negative for emergent large vessel occlusion. Bilateral Circle-of-Willis branches appear within normal limits. 2. Minimal extracranial atherosclerosis, but moderate bilateral ICA siphon calcified plaque. Still, no significant arterial stenosis is identified in the head or neck. 3. Stable and normal CT appearance of the brain. 4. Small 8 mm left upper lobe lung nodule is new since 09/21/2017 and likely infectious or inflammatory.  Dg Chest Port 1 View - Result Date: 11/18/2017: CLINICAL DATA: Hypoxia  EXAM: PORTABLE CHEST 1 VIEW COMPARISON:  November 16, 2017 FINDINGS:  IMPRESSION: Tube positions as described without pneumothorax. Note that the endotracheal tube tip is near the carina. It may be prudent to consider withdrawing the endotracheal tube 2-3 cm. Bibasilar atelectasis.  Lungs elsewhere clear.  Heart size normal.  Dg Abd Portable 1v - Result Date: 11/17/2017. CLINICAL DATA:  Feeding tube placement IMPRESSION: Feeding tube tip in distal stomach. Bowel gas pattern unremarkable. No bowel obstruction or free air evident   Cardiac Studies  LHC 11/02/2017 Conclusion    Dist RCA-1 lesion is 70% stenosed.  Dist RCA-2 lesion is 75% stenosed with 75% stenosed side branch in Ost RPDA.  Post Atrio lesion is 90% stenosed with non-stenotic side branch in 1st RPLB.  Ost LM lesion is 30% stenosed.  Prox Cx lesion is 60% stenosed.  Ost LAD to Prox LAD lesion is 80%  stenosed.  Ost 1st Diag to 1st Diag lesion is 80% stenosed.  Mid LAD to Dist LAD lesion is 70% stenosed.  LV end diastolic pressure is normal.   1. Moderate 3 vessel CAD but no acute occlusion 2. Low LV filling pressures 3. Normal right heart pressures.  4. Mildly reduce cardiac output.   Plan: While she does have coronary artery disease it is not enough to explain drop in EF which appears to be more consistent with stress induced cardiomyopathy. Current hypotension and tachycardia are related to hypovolemia with low filling pressures. Will continue antiplatelet therapy. Hold Coreg in setting of hypotension. Volume repletion. Further  management per CCM.     TTE 11/04/17:  Study Conclusions - Left ventricle: The cavity size was normal. Wall thickness was   normal. Systolic function was moderately to severely reduced. The   estimated ejection fraction was = 30%. - Regional wall motion abnormality: Akinesis of the mid-apical   anterior, mid anteroseptal, apical inferior, apical septal,   apical lateral, and apical myocardium. - Aortic valve: Mildly calcified annulus. Trileaflet; normal   thickness leaflets. Valve area (VTI): 2.98 cm^2. Valve area   (Vmax): 2.92 cm^2. Valve area (Vmean): 2.61 cm^2. - Mitral valve: Mildly calcified annulus. Normal thickness leaflets - Tricuspid valve: There was mild-moderate regurgitation. - Technically difficult study. Echcontrast used to Scientist, forensic. Impressions: - Acute decrease in LVEF and new wall motion abnormalities since   January 23,2019 study. Pattern of wall motion abnormalities   typical of stress induced cardiomyopathy/Takotsubo syndrome,   however this diagnosis requires exclusing of coronary artery   disease.  TTE 11/17/17: Study Conclusions - Left ventricle: Difficult acoustic windows. LVEF is approximately   50% with apical hypokinesis, inferoapical akinesis. With Definity   a mass is seen at apex consistent with  thrombus (2.2 x 2.3 cm)   The cavity size was mildly reduced. Wall thickness was increased   in a pattern of moderate LVH. 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 She was extubated briefly however re-intubated shortly after. She had troponin elevation to 1.68 with reduced EF to 30%, felt to be secondary to stress cardiomyopathy. She was taken for emergent cath which showed moderate 3-vessel CAD without acute occlusion, which did not explain degree of heart failure. This was treated medically with ASA, plavix, lasix and beta blockers. Hospital course otherwise complicated by Sepsis and DKA. She remains intubated, encephalopathic and intermittently febrile.   Assessment & Plan    Moderate 3-vessel CAD, HFrEF, Stress Cardiomyopathy: LHC with moderate 3 vessel CAD however no acute occlusion & extent of disease does not seem to explain acute drop in EF. Repeat TTE 2/25 with improved EF to 50% again with apical hypokinesis and inferoapical akinesis. Apical thrombus appreciated however Heparin discontinued due to fall in platelets concerning for HIT. -Coreg, ASA, Plavix, Lipitor to be continued -Use of ACE-I and BB titration limited by soft BP -Judicious fluids given reduced EF.   Sepsis, Purulent Decub Ulcer, RLL PNA, Recent Flu: Still with leukocytosis, but improved. Transitioned from Vanc and Meropenem to Ancef per primary. Blood cultures negative.  Acute on Chronic Respiratory Failure: Secondary to above, per PCCM  Thrombocytopenia: Drop in platelets while on heparin concerning for HIT. Heparin DC and  transitioned to Bivalirudin. Awaiting HIT panel.  -AVOID Heparin.  Encephalopathy: Neurology on. Remains encephalopathic despite dc sedation. She has new flaccid paralysis despite no clear evidence for hypoxia or prolonged hypotension to suggest anoxic brain injury. CT head negative and unable to obtain MRI 2/2 PPM. Ammonia slightly elevated and has received several doses of lactulose without improvement in mental status. Per primary and neuro. LP ordered, collection limited by body habitus and thrombocytopenia  Metabolic derangements, Hypernatremia: Per primary.  DISPOSITION: GOC discussion with family yesterday with ultimate  decision to continue with current medical management and monitor for improvement over the next several days. Code status was updated to PARTIAL (only intubation, as patient currently intubated). Planning for one way extubation over the next several days.   For questions or updates, please contact [place] Please consult [email or web address] for contact info under Cardiology/STEMI.   Signed, Bethany Molt, DO  11/19/2017, 9:46 AM    I have examined the patient and reviewed assessment and plan and discussed with patient.  Agree with above as stated.  Encephalopathy is primary issue.  Bival for LV thrombus in setting of concern for HIT.  LV dysfunction out of proportion to the degree of CAD per most recent cath report.  Larae Grooms

## 2017-11-20 ENCOUNTER — Inpatient Hospital Stay (HOSPITAL_COMMUNITY): Payer: Medicaid Other

## 2017-11-20 LAB — CBC
HEMATOCRIT: 31.8 % — AB (ref 36.0–46.0)
Hemoglobin: 10.1 g/dL — ABNORMAL LOW (ref 12.0–15.0)
MCH: 30.7 pg (ref 26.0–34.0)
MCHC: 31.8 g/dL (ref 30.0–36.0)
MCV: 96.7 fL (ref 78.0–100.0)
Platelets: 32 10*3/uL — ABNORMAL LOW (ref 150–400)
RBC: 3.29 MIL/uL — ABNORMAL LOW (ref 3.87–5.11)
RDW: 16 % — ABNORMAL HIGH (ref 11.5–15.5)
WBC: 18.3 10*3/uL — ABNORMAL HIGH (ref 4.0–10.5)

## 2017-11-20 LAB — QUANTIFERON-TB GOLD PLUS: QuantiFERON-TB Gold Plus: UNDETERMINED

## 2017-11-20 LAB — PHOSPHORUS: PHOSPHORUS: 2.3 mg/dL — AB (ref 2.5–4.6)

## 2017-11-20 LAB — GLUCOSE, CAPILLARY
GLUCOSE-CAPILLARY: 137 mg/dL — AB (ref 65–99)
GLUCOSE-CAPILLARY: 149 mg/dL — AB (ref 65–99)
GLUCOSE-CAPILLARY: 158 mg/dL — AB (ref 65–99)
GLUCOSE-CAPILLARY: 173 mg/dL — AB (ref 65–99)
GLUCOSE-CAPILLARY: 196 mg/dL — AB (ref 65–99)
GLUCOSE-CAPILLARY: 201 mg/dL — AB (ref 65–99)
GLUCOSE-CAPILLARY: 220 mg/dL — AB (ref 65–99)
GLUCOSE-CAPILLARY: 30 mg/dL — AB (ref 65–99)
GLUCOSE-CAPILLARY: 322 mg/dL — AB (ref 65–99)
GLUCOSE-CAPILLARY: 407 mg/dL — AB (ref 65–99)
Glucose-Capillary: 101 mg/dL — ABNORMAL HIGH (ref 65–99)
Glucose-Capillary: 123 mg/dL — ABNORMAL HIGH (ref 65–99)
Glucose-Capillary: 138 mg/dL — ABNORMAL HIGH (ref 65–99)
Glucose-Capillary: 156 mg/dL — ABNORMAL HIGH (ref 65–99)
Glucose-Capillary: 167 mg/dL — ABNORMAL HIGH (ref 65–99)
Glucose-Capillary: 178 mg/dL — ABNORMAL HIGH (ref 65–99)
Glucose-Capillary: 183 mg/dL — ABNORMAL HIGH (ref 65–99)
Glucose-Capillary: 187 mg/dL — ABNORMAL HIGH (ref 65–99)
Glucose-Capillary: 244 mg/dL — ABNORMAL HIGH (ref 65–99)
Glucose-Capillary: 265 mg/dL — ABNORMAL HIGH (ref 65–99)
Glucose-Capillary: 265 mg/dL — ABNORMAL HIGH (ref 65–99)
Glucose-Capillary: 97 mg/dL (ref 65–99)

## 2017-11-20 LAB — QUANTIFERON-TB GOLD PLUS (RQFGPL)
QUANTIFERON NIL VALUE: 0.02 [IU]/mL
QUANTIFERON TB2 AG VALUE: 0.02 [IU]/mL
QuantiFERON Mitogen Value: 0.35 IU/mL
QuantiFERON TB1 Ag Value: 0.03 IU/mL

## 2017-11-20 LAB — BASIC METABOLIC PANEL
ANION GAP: 6 (ref 5–15)
BUN: 29 mg/dL — ABNORMAL HIGH (ref 6–20)
CHLORIDE: 118 mmol/L — AB (ref 101–111)
CO2: 20 mmol/L — ABNORMAL LOW (ref 22–32)
Calcium: 7.9 mg/dL — ABNORMAL LOW (ref 8.9–10.3)
Creatinine, Ser: 0.51 mg/dL (ref 0.44–1.00)
GFR calc non Af Amer: >60 mL/min (ref >60)
Glucose, Bld: 184 mg/dL — ABNORMAL HIGH (ref 65–99)
POTASSIUM: 3.3 mmol/L — AB (ref 3.5–5.1)
Sodium: 144 mmol/L (ref 135–145)

## 2017-11-20 LAB — HEPARIN INDUCED PLATELET AB (HIT ANTIBODY): HEPARIN INDUCED PLT AB: 0.484 {OD_unit} — AB (ref 0.000–0.400)

## 2017-11-20 LAB — APTT
APTT: 33 s (ref 24–36)
aPTT: 36 seconds (ref 24–36)
aPTT: 53 seconds — ABNORMAL HIGH (ref 24–36)

## 2017-11-20 LAB — MAGNESIUM: Magnesium: 1.7 mg/dL (ref 1.7–2.4)

## 2017-11-20 LAB — AMMONIA: AMMONIA: 17 umol/L (ref 9–35)

## 2017-11-20 MED ORDER — INSULIN ASPART 100 UNIT/ML ~~LOC~~ SOLN
0.0000 [IU] | SUBCUTANEOUS | Status: DC
  Administered 2017-11-21 (×2): 8 [IU] via SUBCUTANEOUS
  Administered 2017-11-21: 5 [IU] via SUBCUTANEOUS

## 2017-11-20 MED ORDER — FUROSEMIDE 10 MG/ML IJ SOLN
INTRAMUSCULAR | Status: AC
  Filled 2017-11-20: qty 4

## 2017-11-20 MED ORDER — INSULIN ASPART 100 UNIT/ML ~~LOC~~ SOLN
0.0000 [IU] | Freq: Three times a day (TID) | SUBCUTANEOUS | Status: DC
  Administered 2017-11-20: 5 [IU] via SUBCUTANEOUS

## 2017-11-20 MED ORDER — FUROSEMIDE 10 MG/ML IJ SOLN
40.0000 mg | Freq: Once | INTRAMUSCULAR | Status: AC
  Administered 2017-11-20: 40 mg via INTRAVENOUS

## 2017-11-20 MED ORDER — SODIUM CHLORIDE 0.9 % IV BOLUS (SEPSIS)
250.0000 mL | Freq: Once | INTRAVENOUS | Status: AC
  Administered 2017-11-20: 250 mL via INTRAVENOUS

## 2017-11-20 MED ORDER — POTASSIUM CHLORIDE 20 MEQ/15ML (10%) PO SOLN
20.0000 meq | ORAL | Status: AC
  Administered 2017-11-20 (×2): 20 meq
  Filled 2017-11-20 (×2): qty 15

## 2017-11-20 MED ORDER — BIVALIRUDIN TRIFLUOROACETATE 250 MG IV SOLR
0.1000 mg/kg/h | INTRAVENOUS | Status: DC
  Administered 2017-11-20: 0.1 mg/kg/h via INTRAVENOUS
  Filled 2017-11-20 (×2): qty 250

## 2017-11-20 MED ORDER — DEXTROSE 50 % IV SOLN
INTRAVENOUS | Status: AC
  Administered 2017-11-20: 28 mL
  Filled 2017-11-20: qty 50

## 2017-11-20 MED ORDER — INSULIN DETEMIR 100 UNIT/ML ~~LOC~~ SOLN
20.0000 [IU] | Freq: Two times a day (BID) | SUBCUTANEOUS | Status: DC
  Administered 2017-11-20 – 2017-11-21 (×3): 20 [IU] via SUBCUTANEOUS
  Filled 2017-11-20 (×3): qty 0.2

## 2017-11-20 NOTE — Progress Notes (Signed)
ANTICOAGULATION CONSULT NOTE - Follow Up Consult  Pharmacy Consult for bivalirudin Indication: possible LV thrombus, r/o HIT  Allergies  Allergen Reactions  . Flexeril [Cyclobenzaprine] Hives  . Heparin Other (See Comments)    Ruling out HIT, Heparin ab sent 2/26. If negative will need to delete allergy.  . Amoxicillin Hives and Rash    Has patient had a PCN reaction causing immediate rash, facial/tongue/throat swelling, SOB or lightheadedness with hypotension: Yes Has patient had a PCN reaction causing severe rash involving mucus membranes or skin necrosis: Yes Has patient had a PCN reaction that required hospitalization No Has patient had a PCN reaction occurring within the last 10 years: Yes If all of the above answers are "NO", then may proceed with Cephalosporin use.     Patient Measurements: Height: 5\' 4"  (162.6 cm) Weight: 208 lb 12.4 oz (94.7 kg) IBW/kg (Calculated) : 54.7  Vital Signs: Temp: 99.1 F (37.3 C) (02/28 0400) Temp Source: Oral (02/28 0400) BP: 81/45 (02/28 0600) Pulse Rate: 99 (02/28 0600)  Labs: Recent Labs    11/18/17 0438 11/18/17 0957  11/19/17 0301  11/19/17 1118 11/19/17 1508  11/19/17 2314 11/20/17 0200 11/20/17 0611  HGB 11.3*  --   --  9.8*  --   --   --   --   --  10.1*  --   HCT 36.1  --   --  30.5*  --   --   --   --   --  31.8*  --   PLT 41*  --   --  36*  --   --   --   --   --  32*  --   APTT  --   --    < >  --    < >  --  44*   < > 33 36 33  LABPROT  --   --   --  31.5*  --   --   --   --   --   --   --   INR  --   --   --  3.08  --   --   --   --   --   --   --   CREATININE 0.74  --   --  0.69  --   --  0.64  --   --  0.51  --   CKTOTAL  --  125  --   --   --  186  --   --   --   --   --    < > = values in this interval not displayed.    Estimated Creatinine Clearance: 92.9 mL/min (by C-G formula based on SCr of 0.51 mg/dL).   Medications:  Infusions:  . sodium chloride    . sodium chloride 250 mL (11/17/17 2100)  .  bivalirudin (ANGIOMAX) infusion 0.5 mg/mL (Non-ACS indications) 0.072 mg/kg/hr (11/20/17 0600)  .  ceFAZolin (ANCEF) IV Stopped (11/20/17 0532)  . dextrose 50 mL/hr at 11/20/17 0600  . feeding supplement (VITAL AF 1.2 CAL) 1,000 mL (11/20/17 0600)  . insulin (NOVOLIN-R) infusion 10.7 Units/hr (11/20/17 0700)    Assessment: 52 yo w/ possible LV thrombus, IV heparin changed to bivalirudin for possible HIT.  HIT antibody pending.  Platelet count still low, but may be stablizing?  No overt bleeding or complications noted.  HIT antibody technically negative (0.35), but with low platelet count, would not advise switching to heparin.  APTT down  to 33 at recheck on bivalirudin 0.072 mg/kg/hr.  Some issues with collection of PTT since bivalirudin is infusing in central line, changed to peripheral infusion yesterday so that PTT can be checked from central line.  Suspect today's PTTs more accurate.  Goal of Therapy:  aPTT 50-85 seconds Monitor platelets by anticoagulation protocol: Yes   Plan:  1. Increase bivalirudin to 0.1 mg/kg/hr. 2. Check PTT in 2 hrs. 3. Daily PTT.  Tad Moore, BCPS  Clinical Pharmacist Pager [phone]  11/20/2017 7:42 AM

## 2017-11-20 NOTE — Progress Notes (Addendum)
PULMONARY / CRITICAL CARE MEDICINE   Name: [name] MRN: [id number] DOB: 1965/12/17    ADMISSION DATE:  11/16/2017  CHIEF COMPLAINT:  Respiratory failure  HISTORY OF PRESENT ILLNESS:       This is a chronically ill 52 year old who presented to [facility] on 2/11 complaining of dyspnea nausea vomiting and diarrhea.  She was found to be influenza positive and required intubation and mechanical ventilation.  She was intubated until 2/21 and when she was extubated she was encephalopathic.  In addition she had an elevation of her cardiac enzymes and an echocardiogram was obtained showing an ejection fraction of 30% felt to be consistent with a Takotsubo syndrome.  She subsequently developed diffuse ST segment elevation with most pronounced elevation inferiorly were Q waves are present.  She was transferred to this hospital and on arrival required intubation for respiratory distress.  She was taken to the cardiac catheterization laboratory where she was found to have diffuse disease not requiring intervention.  Of note her LVEDP was normal at 11.  She was transferred to the intensive care unit where she is unresponsive despite receiving no sedative agents. She receives an anti-TNF agent for Crohn's disease.  She also appears to have chronic lung disease for which she is O2 dependent at home and she has a history of pacer placement.     ETT 2/11 >> 21, 2/24 >>  STUDIES:  2/22 head CT neg 2/24 head CT neg 2/24 [place] abd >> prior chole, no duct dilatation   CULTURES: 2/26 C diff neg 2/24  sputum > MSSA 2/24 blood >>ng 2/24 urine >ng  ANTIBIOTICS: 2/24 Vancomycin >> 2/26 2/24 meropenem >> 2/27 Ancef 2/27 >>     SUBJECTIVE:  No acute events overnight.  This morning was found to have a point-of-care blood sugar in the 30s with repeat in the 40s despite being on the D5 drip and being wide awake and following commands at that time.   VITAL SIGNS: BP (!) 81/45   Pulse 99   Temp  99.1 F (37.3 C) (Oral)   Resp (!) 25   Ht 5\' 4"  (1.626 m)   Wt 208 lb 12.4 oz (94.7 kg)   SpO2 98%   BMI 35.84 kg/m   HEMODYNAMICS: CVP:  [1 mmHg] 1 mmHg  VENTILATOR SETTINGS: Vent Mode: PRVC FiO2 (%):  [40 %] 40 % Set Rate:  [14 bmp] 14 bmp Vt Set:  [440 mL] 440 mL PEEP:  [5 cmH20] 5 cmH20 Plateau Pressure:  [13 cmH20-17 cmH20] 15 cmH20  INTAKE / OUTPUT: I/O last 3 completed shifts: In: 6426.2 [I.V.:2806.2; NG/GT:3320; IV Piggyback:300] Out: 2850 [Urine:1660; Emesis/NG output:100; Stool:1090]  PHYSICAL EXAMINATION: General: older than stated age ,orally intubated and mechanically ventilated  Neuro: Awake, alert, will close her eyes and reopen to command though flaccid paralysis in upper and lower extremities Cardiovascular: S1 and S2 are regular and rapid without murmur rub or gallop.  Lungs:no acc muscle use, there is symmetric air movement Abdomen: soft and nontender    LABS:  BMET Recent Labs  Lab 11/19/17 0301 11/19/17 1508 11/20/17 0200  NA 150* 152* 144  K 3.2* 4.4 3.3*  CL 124* 123* 118*  CO2 18* 20* 20*  BUN 37* 35* 29*  CREATININE 0.69 0.64 0.51  GLUCOSE 307* 115* 184*    Electrolytes Recent Labs  Lab 11/17/17 0411 11/18/17 0438 11/19/17 0301 11/19/17 1508 11/20/17 0200  CALCIUM 8.7* 8.1* 7.9* 8.3* 7.9*  MG 2.5* 2.2  --   --  1.7  PHOS 3.2 1.3* 2.5  --  2.3*    CBC Recent Labs  Lab 11/18/17 0438 11/19/17 0301 11/20/17 0200  WBC 24.1* 17.9* 18.3*  HGB 11.3* 9.8* 10.1*  HCT 36.1 30.5* 31.8*  PLT 41* 36* 32*    Coag's Recent Labs  Lab 11/01/2017 1019  11/19/17 0301  11/19/17 2314 11/20/17 0200 11/20/17 0611  APTT  --    < >  --    < > 33 36 33  INR 1.38  --  3.08  --   --   --   --    < > = values in this interval not displayed.    Sepsis Markers Recent Labs  Lab 11/01/2017 1019 10/30/2017 1253 11/17/17 0411 11/18/17 0438  LATICACIDVEN 1.1 1.1  --   --   PROCALCITON 1.95  --  1.36 0.66    ABG Recent Labs  Lab  11/15/2017 0653 10/28/2017 0930 11/17/17 0650  PHART 7.293* 7.298* 7.396  PCO2ART 40.3 37.9 39.8  PO2ART 161.0* 86.0 86.1    Liver Enzymes Recent Labs  Lab 11/01/2017 1019 11/18/17 0438 11/19/17 0301  AST 110* 52* 37  ALT 192* 103* 75*  ALKPHOS 449* 300* 251*  BILITOT 0.9 0.8 0.6  ALBUMIN 2.3* 2.0* 2.0*    Cardiac Enzymes Recent Labs  Lab 11/18/2017 0429  TROPONINI 0.81*    Glucose Recent Labs  Lab 11/20/17 0208 11/20/17 0311 11/20/17 0414 11/20/17 0501 11/20/17 0601 11/20/17 0701  GLUCAP 149* 187* 244* 201* 173* 158*    Imaging Dg Chest Port 1 View  Result Date: 11/20/2017 CLINICAL DATA:  Acute respiratory failure status post cardiac catheterization four days ago. Right basilar atelectasis. History of COPD. EXAM: PORTABLE CHEST 1 VIEW COMPARISON:  Portable chest x-ray of November 19, 2017 FINDINGS: The patient is rotated on today's study. The right hemidiaphragm is higher than the left. There is no focal infiltrate. The lung markings are mildly prominent at the right lung base. The heart is normal in size. The pulmonary vascularity is not engorged. The ICD is in stable position. The Dobbhoff feeding tube tip projects below the inferior margin of the image. The endotracheal tube tip lies 2.3 cm above the carina. IMPRESSION: Allowing for differences space in positioning there has not been significant interval change in the appearance of the chest. Probable subsegmental atelectasis at the right lung base. Electronically Signed   By: David  [place] M.D.   On: 11/20/2017 07:58   Dg Chest Port 1 View  Result Date: 11/19/2017 CLINICAL DATA:  Status post intubation. EXAM: PORTABLE CHEST 1 VIEW COMPARISON:  11/19/2017 FINDINGS: The ET tube tip is above the carina. There is an enteric tube with tip well below the GE junction. Right arm PICC line tip is in the distal SVC. There is a left chest wall pacer device with leads in the right atrial appendage and right ventricle. Normal heart  size. Decreased lung volumes are again noted. Atelectasis is noted within the right base. IMPRESSION: 1. Support apparatus positioned as above.  Stable from prior exam. 2. Persistent right base atelectasis. Electronically Signed   By: Signa Kell M.D.   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  She was admitted with respiratory failure and influenza at [facility] subsequently developed type II cardiac ischemia with a Takotsubo appearance on  echo.  She was transferred to [facility] due to diffuse ST segment elevations and is found to have diffuse coronary disease not requiring interventions with an EF estimated at 30% and a normal LVEDP.  She  required reintubation and has NEW flaccid quadriparesis of unclear etiology  ASSESSMENT / PLAN:  PULMONARY A: Acute resp failure  -Spontaneous breathing trial deferred today given plans for likely extubation tomorrow. - ct duonebs  CARDIOVASCULAR A: CAD Cardiomyopathy ? Takatsubo's , HF rEF SSS s/p PPM LV thrombus  - COreg, ASA, plavix, lipitor -ct bivalirudin (no heparin)  - chest xray without significant pulmonary edema, however will attempt to diuresis at least to net even ( was +3L 2/27) in order to optimize chaces of successful extubation  RENAL A: Hypernatremia  -resolved -D5 drip discontinued as below Hypokalemia/severe hypophos -repleting  GASTROINTESTINAL A: Abnormal LFTs, slight high ammonia level 59 Crohn's disease abd [place] neg   -ct TFs -follow LFTs intermittent - continue lactuose once daily - RUQ US showed findings concerning for possible cirrhosis though recommended elastography  HEMATOLOGIC A:  Thrombocytopenia -HIT panel negative 2/26 but repeated 2/27 for unclear reasons.  Will continue bivalrudin while repeat pending -AVOID heparin    INFECTIOUS A: Influenza pneumonia RLL  HCAP -MSSA  - dc vancomycin and meropenem -continue ancef   ENDOCRINE A: DM-2 , uncontrolled , on D5 drip - hypernatremia now resolved so will discontinue D5 drip and monitor, however she may require restarting his D5 drip now for hypoglycemia - Once insulin drip rate is stable we will use this to estimate her subQ insulin needs and transition at that time   NEUROLOGIC A: Acute encephalopathy - head CT neg, ammonia slight high, NO clear episode of hypoxia/ hypotension documented  -Neuro input , ideally needs MRI but cannot since has PPM - consider lumbar puncture but risk thought to outweigh benefit due to thrombocytopenia -lactulose once daily for now  Family GOC discussion - 2/27 discussion with Dr Vassie Loll and family decided that they would plan for extubation without plans for reintubation on 2/29    The patient is critically ill with multiple organ systems failure and requires high complexity decision making for assessment and support, frequent evaluation and titration of therapies, application of advanced monitoring technologies and extensive interpretation of multiple databases. Critical Care Time devoted to patient care services described in this note independent of APP/resident  time is 30 minutes.   [place] Alajia Schmelzer, MD [facility] Pulmonology/Critical Care Pager [phone] After hours pager: [phone]     11/20/2017, 8:06 AM

## 2017-11-20 NOTE — Progress Notes (Addendum)
Patient ID: [name], female   DOB: 1966/05/24, 52 y.o.   MRN: [id number]  This NP visited patient at the bedside as a follow up for palliative medicine needs and emotional support.    Patient remains intubated, unable to follow commands, condition unchanged. Her limbs remain flaccid.    Returned call to  [facility] her  Brother/HPOA.  He is requesting clarification for the plan of a one way extubation/liberation from the ventilator.  He speaks freely to his and his family's Understanding  of the long-term prognosis being poor and that it is unlikely that she could return to her baseline quality of life.  He states, "its been three weeks and she is no better " I know my sister well enough,  and she has had enough". Family has made decision to shift to a more comfort approach to care.   Family request one way extubation  tomorrow at 1:00 pm.   Focus of care is comfort and dignity.  We discussed utilization of medications to treat symptoms of dyspnea pain and anxiety.  We discussed that if as needed/prn  medications were not efficacious for symptom managemtn we would consider a continuous gtt, he is in agreement.  They understand the uncertainty of the situation once the patient is weaned from the ventilator.  Family does want to continue IV antibiotics and IV fluids.  Again the focus is comfort, family does not want the patient to suffer in any way. If patient survives any length of time and is stable enough for transition, family would be interested in hospice of [place].   Discussed with patient the importance of continued conversation with family and their  medical providers regarding overall plan of care and treatment options,  ensuring decisions are within the context of the patients values and GOCs.  Patient's brother/healthcare power of attorney is also a Marine scientist.  He does not want his sister's body taken to the morgue after death, he will see to it that the body is  picked up promptly and taken to his funeral home.  Questions and concerns addressed   Discussed with Dr Loran Senters  Time in   1020       Time out 1045   Total time spent on the unit was 25 minutes  Greater than 50% of the time was spent in counseling and coordination of care  Lorinda Creed NP  Palliative Medicine Team Team Phone # [phone] Pager [phone]

## 2017-11-20 NOTE — Progress Notes (Signed)
[facility] ADULT ICU REPLACEMENT PROTOCOL FOR AM LAB REPLACEMENT ONLY  The patient does apply for the [facility] Adult ICU Electrolyte Replacment Protocol based on the criteria listed below:   1. Is GFR >/= 40 ml/min? Yes.    Patient's GFR today is >60 2. Is urine output >/= 0.5 ml/kg/hr for the last 6 hours? Yes.   Patient's UOP is 0.79 ml/kg/hr 3. Is BUN < 60 mg/dL? Yes.    Patient's BUN today is 29 4. Abnormal electrolyte(s):Potassium 3.3 5. Ordered repletion with: Potassium per protocol 6. If a panic level lab has been reported, has the CCM MD in charge been notified? No..   Physician:    Thomasenia Bottoms 11/20/2017 4:16 AM

## 2017-11-21 DIAGNOSIS — Z978 Presence of other specified devices: Secondary | ICD-10-CM

## 2017-11-21 DIAGNOSIS — Z515 Encounter for palliative care: Secondary | ICD-10-CM

## 2017-11-21 DIAGNOSIS — G8250 Quadriplegia, unspecified: Secondary | ICD-10-CM

## 2017-11-21 DIAGNOSIS — R0600 Dyspnea, unspecified: Secondary | ICD-10-CM

## 2017-11-21 DIAGNOSIS — K5090 Crohn's disease, unspecified, without complications: Secondary | ICD-10-CM

## 2017-11-21 LAB — CBC
HEMATOCRIT: 30.7 % — AB (ref 36.0–46.0)
HEMOGLOBIN: 9.8 g/dL — AB (ref 12.0–15.0)
MCH: 30.3 pg (ref 26.0–34.0)
MCHC: 31.9 g/dL (ref 30.0–36.0)
MCV: 95 fL (ref 78.0–100.0)
Platelets: 32 10*3/uL — ABNORMAL LOW (ref 150–400)
RBC: 3.23 MIL/uL — ABNORMAL LOW (ref 3.87–5.11)
RDW: 15.4 % (ref 11.5–15.5)
WBC: 13.6 10*3/uL — ABNORMAL HIGH (ref 4.0–10.5)

## 2017-11-21 LAB — GLUCOSE, CAPILLARY
GLUCOSE-CAPILLARY: 265 mg/dL — AB (ref 65–99)
GLUCOSE-CAPILLARY: 238 mg/dL — AB (ref 65–99)
GLUCOSE-CAPILLARY: 194 mg/dL — AB (ref 65–99)

## 2017-11-21 LAB — BASIC METABOLIC PANEL
Anion gap: 8 (ref 5–15)
BUN: 24 mg/dL — ABNORMAL HIGH (ref 6–20)
CHLORIDE: 113 mmol/L — AB (ref 101–111)
CO2: 23 mmol/L (ref 22–32)
Calcium: 8.3 mg/dL — ABNORMAL LOW (ref 8.9–10.3)
Creatinine, Ser: 0.6 mg/dL (ref 0.44–1.00)
GFR calc Af Amer: >60 mL/min (ref >60)
GFR calc non Af Amer: >60 mL/min (ref >60)
Glucose, Bld: 273 mg/dL — ABNORMAL HIGH (ref 65–99)
POTASSIUM: 3.7 mmol/L (ref 3.5–5.1)
SODIUM: 144 mmol/L (ref 135–145)

## 2017-11-21 LAB — CULTURE, BLOOD (ROUTINE X 2)
CULTURE: NO GROWTH
CULTURE: NO GROWTH
Special Requests: ADEQUATE
Special Requests: ADEQUATE

## 2017-11-21 LAB — APTT: aPTT: 59 seconds — ABNORMAL HIGH (ref 24–36)

## 2017-11-21 MED ORDER — MORPHINE SULFATE 25 MG/ML IV SOLN
2.0000 mg/h | INTRAVENOUS | Status: DC
  Administered 2017-11-21: 2 mg/h via INTRAVENOUS
  Administered 2017-11-23 – 2017-11-24 (×2): 8 mg/h via INTRAVENOUS
  Filled 2017-11-21 (×3): qty 10

## 2017-11-21 MED ORDER — GLYCOPYRROLATE 0.2 MG/ML IJ SOLN
0.2000 mg | INTRAMUSCULAR | Status: DC | PRN
  Administered 2017-11-21 – 2017-11-23 (×3): 0.2 mg via INTRAVENOUS
  Filled 2017-11-21 (×3): qty 1

## 2017-11-21 MED ORDER — MORPHINE BOLUS VIA INFUSION
2.0000 mg | INTRAVENOUS | Status: DC | PRN
  Administered 2017-11-21 – 2017-11-23 (×5): 2 mg via INTRAVENOUS
  Filled 2017-11-21: qty 4

## 2017-11-21 MED ORDER — MORPHINE 100MG IN NS 100ML (1MG/ML) PREMIX INFUSION
2.0000 mg/h | INTRAVENOUS | Status: DC
  Filled 2017-11-21: qty 100

## 2017-11-21 MED ORDER — MORPHINE 100MG IN NS 100ML (1MG/ML) PREMIX INFUSION: 2.0000 mg/h | INTRAVENOUS | Status: DC

## 2017-11-21 MED ORDER — ORAL CARE MOUTH RINSE
15.0000 mL | Freq: Two times a day (BID) | OROMUCOSAL | Status: DC
  Administered 2017-11-22 – 2017-11-23 (×4): 15 mL via OROMUCOSAL

## 2017-11-21 MED ORDER — LORAZEPAM 2 MG/ML IJ SOLN
1.0000 mg | INTRAMUSCULAR | Status: DC
  Administered 2017-11-21 – 2017-11-24 (×19): 1 mg via INTRAVENOUS
  Filled 2017-11-21 (×19): qty 1

## 2017-11-21 NOTE — Progress Notes (Signed)
Inpatient Diabetes Program Recommendations  AACE/ADA: New Consensus Statement on Inpatient Glycemic Control (2015)  Target Ranges:  Prepandial:   less than 140 mg/dL      Peak postprandial:   less than 180 mg/dL (1-2 hours)      Critically ill patients:  140 - 180 mg/dL   Results for DERA, FEULING (MRN [id number]) as of 11/21/2017 11:14  Ref. Range 11/20/2017 13:03 11/20/2017 14:02 11/20/2017 15:04 11/20/2017 17:13 11/20/2017 19:55 11/20/2017 23:27 11/21/2017 03:15 11/21/2017 07:55  Glucose-Capillary Latest Ref Range: 65 - 99 mg/dL 563 (H) 875 (H) 643 (H) 220 (H) 265 (H) 265 (H) 265 (H) 238 (H)   Review of Glycemic Control  Diabetes history: DM2 Outpatient Diabetes medications: Novolog 15-21 units TID, Invokana 100 mg daily, Levemir 80 units QHS, Metformin 1000 mg BID Current orders for Inpatient glycemic control: Levemir 20 units BID, Novolog 0-15 units Q4H  Inpatient Diabetes Program Recommendations:  Insulin - Basal: Please consider increasing Levemir to 25 units BID. Correction (SSI): Please consider increasing Novolog correction to Novolog 0-20 units Q4H. Insulin -Tube Feeding Coverage: Please consider ordering Novolog 5 units Q4H for tube feeding coverage.   NOTE: Noted patient was transitioned from IV to SQ insulin yesterday and glucose has been consistently over 238 mg/dl over the past 12 hours.  Thanks, Orlando Penner, RN, MSN, CDE Diabetes Coordinator Inpatient Diabetes Program [phone] (Team Pager from 8am to 5pm)

## 2017-11-21 NOTE — Progress Notes (Signed)
ANTICOAGULATION CONSULT NOTE - Follow Up Consult  Pharmacy Consult for bivalirudin Indication: possible LV thrombus, r/o HIT  Allergies  Allergen Reactions  . Flexeril [Cyclobenzaprine] Hives  . Heparin Other (See Comments)    Ruling out HIT, Heparin ab 0.484  . Amoxicillin Hives and Rash    Has patient had a PCN reaction causing immediate rash, facial/tongue/throat swelling, SOB or lightheadedness with hypotension: Yes Has patient had a PCN reaction causing severe rash involving mucus membranes or skin necrosis: Yes Has patient had a PCN reaction that required hospitalization No Has patient had a PCN reaction occurring within the last 10 years: Yes If all of the above answers are "NO", then may proceed with Cephalosporin use.     Patient Measurements: Height: 5\' 4"  (162.6 cm) Weight: 192 lb 0.3 oz (87.1 kg) IBW/kg (Calculated) : 54.7  Vital Signs: Temp: 97.7 F (36.5 C) (03/01 0757) Temp Source: Oral (03/01 0757) BP: 104/59 (03/01 0815) Pulse Rate: 90 (03/01 0815)  Labs: Recent Labs    11/18/17 0957  11/19/17 0301  11/19/17 1118 11/19/17 1508  11/20/17 0200 11/20/17 0611 11/20/17 1145 11/21/17 0417  HGB  --    < > 9.8*  --   --   --   --  10.1*  --   --  9.8*  HCT  --   --  30.5*  --   --   --   --  31.8*  --   --  30.7*  PLT  --   --  36*  --   --   --   --  32*  --   --  32*  APTT  --    < >  --    < >  --  44*   < > 36 33 53* 59*  LABPROT  --   --  31.5*  --   --   --   --   --   --   --   --   INR  --   --  3.08  --   --   --   --   --   --   --   --   CREATININE  --    < > 0.69  --   --  0.64  --  0.51  --   --  0.60  CKTOTAL 125  --   --   --  186  --   --   --   --   --   --    < > = values in this interval not displayed.    Estimated Creatinine Clearance: 88.9 mL/min (by C-G formula based on SCr of 0.6 mg/dL).   Medications:  Infusions:  . sodium chloride    . sodium chloride 250 mL (11/20/17 1900)  . bivalirudin (ANGIOMAX) infusion 0.5 mg/mL  (Non-ACS indications) 0.1 mg/kg/hr (11/20/17 2216)  .  ceFAZolin (ANCEF) IV Stopped (11/21/17 1610)  . feeding supplement (VITAL AF 1.2 CAL) 1,000 mL (11/20/17 1725)    Assessment: 52 yo w/ possible LV thrombus, IV heparin changed to bivalirudin for possible HIT.   HIT antibody technically negative (0.35), but with low platelet count, would not advise switching to heparin.  Therapeutic aPTT at 59 sec on 0.1mg /kg/hr.  Plts remain in 30s, may be stabilizing, no bleeding observed.     Goal of Therapy:  aPTT 50-85 seconds Monitor platelets by anticoagulation protocol: Yes   Plan:  Continue bivalirudin to 0.1 mg/kg/hr Daily PTT  Daylene Posey, PharmD Pharmacy Resident Pager #: [phone] 11/21/2017 9:00 AM

## 2017-11-21 NOTE — Progress Notes (Signed)
Daily Progress Note   Patient Name: [name]       Date: 11/21/2017 DOB: 02/28/1966  Age: 52 y.o. MRN#: [id number] Attending Physician: Sampson Goon, MD Primary Care Physician: [facility] [place] Date: 09/24/5850  Reason for Consultation/Follow-up: Establishing goals of care and Terminal Care  Subjective: Patient alert and tracks. Responds to pain/discomfort but does not follow commands. Flaccid extremities.   GOC:  Follow-up palliative conversation including Dr. Pearline Cables in family conference room. Coralyn Mark (brother) is HCPOA. Multiple family members present. Coralyn Mark and family confirm their wishes to terminally extubate today. They speak of Ms. Goodpasture being disabled for many years with chronic co-morbidities. She has previously spoken of her wishes against heroic measures or life-prolonging measures if she was not improving. She has been hospitalized for 3+ weeks now and they feel she will not recover to a meaningful quality of life. They confirm she would NOT want prolonging measures continued.   Discussed process of terminal extubation and transition to comfort measures only. Educated on medications to ensure relief from pain or suffering. Educated on EOL expecations.   Chaplain consult placed to provide prayer to family.   Therapeutic listening as Coralyn Mark shares stories of his sister. Emotional/spiritual support provided.   Coralyn Mark again speaks of NOT wanting her to go to morgue if she passes at the hospital. Notified RN and care team.   *Present during extubation and stayed with family to ensure she was comfortable. Answered questions. They understand this may be hours but not to be surprised if she lasted a little longer (day or so).   Length of Stay: 18  Current  Medications: Scheduled Meds:  . budesonide (PULMICORT) nebulizer solution  0.5 mg Nebulization BID  . chlorhexidine gluconate (MEDLINE KIT)  15 mL Mouth Rinse BID  . Chlorhexidine Gluconate Cloth  6 each Topical Daily  . insulin aspart  0-15 Units Subcutaneous Q4H  . ipratropium  0.5 mg Nebulization Q6H  . levalbuterol  0.63 mg Nebulization Q6H  . LORazepam  1 mg Intravenous Q4H  . mouth rinse  15 mL Mouth Rinse 10 times per day  . sodium chloride flush  10-40 mL Intracatheter Q12H  . sodium chloride flush  3 mL Intravenous Q12H    Continuous Infusions: . sodium chloride    . sodium chloride 250  mL (11/20/17 1900)  . morphine 1 mg/ml infusion      PRN Meds: sodium chloride, sodium chloride, acetaminophen (TYLENOL) oral liquid 160 mg/5 mL, acetaminophen **OR** acetaminophen, fentaNYL (SUBLIMAZE) injection, levalbuterol, LORazepam, sodium chloride flush, sodium chloride flush  Physical Exam  Constitutional: She is easily aroused. She appears ill. She is intubated.  HENT:  Head: Normocephalic and atraumatic.  Cardiovascular: Regular rhythm.  Pulmonary/Chest: She is intubated.  Terminal extubation  Abdominal: There is no tenderness.  Neurological: She is easily aroused.  Eyes open, tracks, does not follow commands, extremities flaccid  Skin: Skin is warm and dry. There is pallor.  Nursing note and vitals reviewed.          Vital Signs: BP (!) 79/47   Pulse 96   Temp 97.7 F (36.5 C) (Oral)   Resp (!) 27   Ht _0  (1.626 m)   Wt 87.1 kg (192 lb 0.3 oz)   SpO2 96%   BMI 32.96 kg/m  SpO2: SpO2: 96 % O2 Device: O2 Device: Ventilator O2 Flow Rate: O2 Flow Rate (L/min): 15 L/min  Intake/output summary:   Intake/Output Summary (Last 24 hours) at 11/21/2017 1354 Last data filed at 11/21/2017 1200 Gross per 24 hour  Intake 2840.1 ml  Output 1575 ml  Net 1265.1 ml   LBM: Last BM Date: 11/20/17 Baseline Weight: Weight: 96.2 kg (212 lb) Most recent weight: Weight: 87.1 kg  (192 lb 0.3 oz)   Palliative Assessment/Data: PPS 10%     Patient Active Problem List   Diagnosis Date Noted  . DNR (do not resuscitate)   . Encephalopathy   . Malnutrition of moderate degree 11/17/2017  . Palliative care by specialist   . Pressure injury of skin 11/13/2017  . Sepsis ([place]) 11/15/2017  . Stress-induced cardiomyopathy   . Acute respiratory failure with hypoxia ([place]) 11/05/2017  . Hyponatremia 11/05/2017  . Hypokalemia 11/05/2017  . COPD exacerbation ([place]) 11/05/2017  . Influenza A 11/19/2017  . Crohn's disease without complication ([place])   . Vitamin D deficiency 11/19/2016  . Moderate episode of recurrent major depressive disorder ([place]) 11/01/2016  . Chronic diarrhea   . Dysphagia   . PTSD (post-traumatic stress disorder) 08/29/2016  . Acute bronchitis 12/27/2015  . Upper airway cough syndrome 10/24/2015  . Morbid obesity ([place]) 10/24/2015  . Essential hypertension 10/24/2015  . LLQ abdominal tenderness 10/20/2015  . Diabetic neuropathy ([place]) 08/02/2015  . Depression 07/04/2015  . COPD GOLD 0 / still smoking  07/04/2015  . OSA (obstructive sleep apnea) 07/04/2015  . Other specified cardiac dysrhythmias(427.89)   . DM type 2 causing vascular disease ([place]) 08/22/2010  . MIXED HYPERLIPIDEMIA 11/20/2009  . Current smoker 11/03/2009  . IBS 10/23/2009  . Cardiac pacemaker in situ 08/29/2009  . ESOPHAGEAL STRICTURE 06/28/2009  . Esophageal reflux 03/16/2009  . Gastroparesis 03/16/2009    Palliative Care Assessment & Plan   Patient Profile: 52 y.o. female  admitted on 10/30/2017 with PMH of sinus tachycardia status post sinus node modification and PPM implantation, COPD/oxygen dependent at home, Crohn's disease,  hypertension [facility] and [facility].  He was transported to [facility] on 11/15/2017 she remains intubated  She was also foundto have a reduced EF of 30% on  echocardiogram attributed to the stresscardiomyopathy and transferred for concern of STEMI.   Neurology consulted as pt continues to remain encephalopathic and not moving all 4 extremities.  She has a  past psychiatric history to  include PTSD, depression.  In spite of medical interventions the patient remains intubated and encephalopathic and unable to follow commands.  Assessment: Acute on chronic respiratory failure Acute encephalopathy CAD Cardiomyopathy with EF 30% LV thrombus Thrombocytopenia Hx of Crohn's disease Hx of COPD on home oxygen  Recommendations/Plan:  Terminal extubation with transition to comfort measures only per wishes of HCPOA and family. Patient has previously spoken to HCPOA/family regarding her wishes against heroic or life prolonging measures at EOL. Dr. Pearline Cables again confirmed this with family in conference room.  Symptom management  Morphine continuous infusion per PCCM.   Morphine bolus via infusion 2-4m IV q149m prn pain/dyspnea/air hunger  Ativan 31m531mV scheduled q4h  Robinul 0.2mg16m q4h prn secretions  Chaplain consult for EOL care.   Compassion cart for family.   HCPOA and family understand guarded prognosis--may be hours-days. Patient with shallow respirations post extubation. Anticipate hospital death.   HCPOA/brother is a funeNurse, learning disability requests body NOT be taken to the morgue. Please notify TerrCoralyn Markediately if patient expires.   Goals of Care and Additional Recommendations:  Limitations on Scope of Treatment: Full Comfort Care  Code Status: DNR   Code Status Orders  (From admission, onward)        Start     Ordered   11/21/17 1344  Do not attempt resuscitation (DNR)  Continuous    Question Answer Comment  In the event of cardiac or respiratory ARREST Do not call a "code blue"   In the event of cardiac or respiratory ARREST Do not perform Intubation, CPR, defibrillation or ACLS   In the event of cardiac or respiratory  ARREST Use medication by any route, position, wound care, and other measures to relive pain and suffering. May use oxygen, suction and manual treatment of airway obstruction as needed for comfort.      11/21/17 1343    Code Status History    Date Active Date Inactive Code Status Order ID Comments User Context   11/18/2017 17:07 11/21/2017 13:43 Partial Code [id number] Royalty Inpatient   11/13/2017 09:26 11/18/2017 17:07 Full Code [id number] Goon Inpatient   11/04/2017 08:32 10/31/2017 09:25 Full Code [id number] Dubois Inpatient   11/07/2017 23:36 11/04/2017 08:32 Full Code [id number],[facility]       Prognosis:   Hours - Days  Discharge Planning:  To Be Determined  Care plan was discussed with HCPOA/brother (TerCoralyn Markultiple family members, RN, and Dr. GrayPearline Cablesank you for allowing the Palliative Medicine Team to assist in the care of this patient.   Time In: [id number] Out:  Total Time 70mi45molonged Time Billed yes      Greater than 50%  of this time was spent counseling and coordinating care related to the above assessment and plan.  MeganIhor Dow-C Palliative Medicine Team  Phone: [phone] contact Palliative Medicine Team phone at [phone] and concerns.

## 2017-11-21 NOTE — Progress Notes (Signed)
Chaplain came to Pt.'s room by way of Spiritual Care consult related to end of life and emotional support for family.  Present in the room were patient's two sons and sister. Chaplain offered ministry of presence, words of comfort and prayer. Ms. Ballog two sons are nice young men who are aware of the critical nature of their mother's condition and are aware that the end is near. - They were both tearful, this is hard for them both. I will make the department and chaplain's coming on for the evening shift aware and recommend a follow-up visit.

## 2017-11-21 NOTE — Progress Notes (Addendum)
PULMONARY / CRITICAL CARE MEDICINE   Name: [name] MRN: [id number] DOB: 02/13/1966    ADMISSION DATE:  Nov 28, 2017  CHIEF COMPLAINT:  Respiratory failure  HISTORY OF PRESENT ILLNESS:       This is a chronically ill 52 year old who presented to [facility] on 2/11 complaining of dyspnea nausea vomiting and diarrhea.  She was found to be influenza positive and required intubation and mechanical ventilation.  She was intubated until 2/21 and when she was extubated she was encephalopathic.  In addition she had an elevation of her cardiac enzymes and an echocardiogram was obtained showing an ejection fraction of 30% felt to be consistent with a Takotsubo syndrome.  She subsequently developed diffuse ST segment elevation with most pronounced elevation inferiorly were Q waves are present.  She was transferred to this hospital and on arrival required intubation for respiratory distress.  She was taken to the cardiac catheterization laboratory where she was found to have diffuse disease not requiring intervention.  Of note her LVEDP was normal at 11.  She was transferred to the intensive care unit where she is unresponsive despite receiving no sedative agents. She receives an anti-TNF agent for Crohn's disease.  She also appears to have chronic lung disease for which she is O2 dependent at home and she has a history of pacer placement.     ETT 2/11 >> 21, 2/24 >>  STUDIES:  2/22 head CT neg 2/24 head CT neg 2/24 [place] abd >> prior chole, no duct dilatation   CULTURES: 2/26 C diff neg 2/24  sputum > MSSA 2/24 blood >>ng 2/24 urine >ng  ANTIBIOTICS: 2/24 Vancomycin >> 2/26 2/24 meropenem >> 2/27 Ancef 2/27 >>     SUBJECTIVE:  No acute events overnight.  By report of nursing she is intermittently interactive.  She was on no sedation at the time of my examination and was not at all interactive with me despite having spontaneous eye opening.   VITAL SIGNS: BP (!) 104/59 (BP  Location: Left Arm)   Pulse 90   Temp 97.7 F (36.5 C) (Oral)   Resp (!) 27   Ht 5\' 4"  (1.626 m)   Wt 192 lb 0.3 oz (87.1 kg)   SpO2 100%   BMI 32.96 kg/m   HEMODYNAMICS:    VENTILATOR SETTINGS: Vent Mode: PRVC FiO2 (%):  [40 %] 40 % Set Rate:  [14 bmp] 14 bmp Vt Set:  [440 mL] 440 mL PEEP:  [5 cmH20] 5 cmH20 Plateau Pressure:  [13 cmH20-17 cmH20] 17 cmH20  INTAKE / OUTPUT: I/O last 3 completed shifts: In: 27 [I.V.:1788; NG/GT:3190; IV Piggyback:300] Out: 4375 [Urine:4175; Stool:200]  PHYSICAL EXAMINATION: General: older than stated age ,orally intubated and mechanically ventilated  Neuro: There is spontaneous eye opening but no interaction.  Pupils are equal at approximately 6 mm.  There is spontaneous movement of the head but of no extremities.  Limbs are all flaccid with the exception of the right upper extremity which seems to have some tone.  She is areflexic  Cardiovascular: S1 and S2 are regular and rapid without murmur rub or gallop.  Lungs: There is symmetric air movement, lots of scattered rhonchi, no wheezes.  Minute ventilation is high at 13-14 L/min.   Abdomen: soft and nontender    LABS:  BMET Recent Labs  Lab 11/19/17 1508 11/20/17 0200 11/21/17 0417  NA 152* 144 144  K 4.4 3.3* 3.7  CL 123* 118* 113*  CO2 20* 20* 23  BUN  35* 29* 24*  CREATININE 0.64 0.51 0.60  GLUCOSE 115* 184* 273*    Electrolytes Recent Labs  Lab 11/17/17 0411 11/18/17 0438 11/19/17 0301 11/19/17 1508 11/20/17 0200 11/21/17 0417  CALCIUM 8.7* 8.1* 7.9* 8.3* 7.9* 8.3*  MG 2.5* 2.2  --   --  1.7  --   PHOS 3.2 1.3* 2.5  --  2.3*  --     CBC Recent Labs  Lab 11/19/17 0301 11/20/17 0200 11/21/17 0417  WBC 17.9* 18.3* 13.6*  HGB 9.8* 10.1* 9.8*  HCT 30.5* 31.8* 30.7*  PLT 36* 32* 32*    Coag's Recent Labs  Lab 10/27/2017 1019  11/19/17 0301  11/20/17 0611 11/20/17 1145 11/21/17 0417  APTT  --    < >  --    < > 33 53* 59*  INR 1.38  --  3.08  --   --    --   --    < > = values in this interval not displayed.    Sepsis Markers Recent Labs  Lab 10/31/2017 1019 11/04/2017 1253 11/17/17 0411 11/18/17 0438  LATICACIDVEN 1.1 1.1  --   --   PROCALCITON 1.95  --  1.36 0.66    ABG Recent Labs  Lab 11/02/2017 0653 11/01/2017 0930 11/17/17 0650  PHART 7.293* 7.298* 7.396  PCO2ART 40.3 37.9 39.8  PO2ART 161.0* 86.0 86.1    Liver Enzymes Recent Labs  Lab 11/07/2017 1019 11/18/17 0438 11/19/17 0301  AST 110* 52* 37  ALT 192* 103* 75*  ALKPHOS 449* 300* 251*  BILITOT 0.9 0.8 0.6  ALBUMIN 2.3* 2.0* 2.0*    Cardiac Enzymes Recent Labs  Lab 11/17/2017 0429  TROPONINI 0.81*    Glucose Recent Labs  Lab 11/20/17 1504 11/20/17 1713 11/20/17 1955 11/20/17 2327 11/21/17 0315 11/21/17 0755  GLUCAP 183* 220* 265* 265* 265* 238*    Imaging No results found.    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  She was admitted with respiratory failure and influenza at [facility] subsequently developed type II cardiac ischemia with a Takotsubo appearance on echo.  She was transferred to [facility] due to diffuse ST segment elevations and is found to have diffuse coronary disease not requiring interventions with an EF estimated at 30% and a normal LVEDP.  Follow-up echo has shown an improvement in ejection fraction to 50% with an apical thrombus.  She  required reintubation and has NEW flaccid quadriparesis of unclear etiology  ASSESSMENT / PLAN:  PULMONARY A: Acute resp failure  I am holding on plans for evaluating her respiratory failure pending discussion with family today  CARDIOVASCULAR A: CAD Cardiomyopathy ? Takatsubo's , HF rEF SSS s/p PPM LV thrombus  - COreg, ASA, plavix, lipitor -ct bivalirudin (no heparin)  - chest xray without significant pulmonary edema, however will attempt to diuresis at least to net even ( was  +3L 2/27) in order to optimize chaces of successful extubation  RENAL A: Hypernatremia  -resolved -D5 drip discontinued as below Hypokalemia/severe hypophos -repleting  GASTROINTESTINAL A: Abnormal LFTs, slight high ammonia level 59 Crohn's disease abd [place] neg   -ct TFs -follow LFTs intermittent - continue lactuose once daily - RUQ US showed findings concerning for possible cirrhosis   HEMATOLOGIC A:  Thrombocytopenia -HIT panel negative 2/26 but repeated 2/27 for unclear reasons.  Will continue bivalrudin while repeat pending -AVOID heparin  INFECTIOUS A: Influenza pneumonia RLL HCAP -MSSA  - dc vancomycin and meropenem -continue ancef   ENDOCRINE A: DM-2 , uncontrolled , on D5 drip - hypernatremia now resolved so will discontinue D5 drip and monitor, however she may require restarting his D5 drip now for hypoglycemia - Once insulin drip rate is stable we will use this to estimate her subQ insulin needs and transition at that time   NEUROLOGIC A: Acute encephalopathy - head CT neg, ammonia slight high, NO clear episode of hypoxia/ hypotension documented  -Neuro input , ideally needs MRI but cannot since has PPM - consider lumbar puncture but risk thought to outweigh benefit due to thrombocytopenia -lactulose once daily for now  Differential diagnosis for her quadriparesis is broad.  Transverse myelitis, Guillain-Barr syndrome, neuropathy/myopathy of acute illness are all considerations but the list is not entirely inclusive.  I would like to personally speak with family today to determine whether they do not wish to have further diagnostics because of the poor quality of her health prior to her acute illness or because they are convinced that we do not have a treatable illness.  In the event that this is Guillain-Barr syndrome that would not be true.  Penny Pia, MD [place] [facility]/Critical Care Pager [phone] After hours pager: [phone]       I was able to  meet with family this afternoon including the brother of the patient holds power of attorney.  Clearly the patient is not sufficiently interactive to direct her own medical care.  I discussed the family's understanding of Niurka's disease with family.  They realize that her weakness may be reversible, however they are of one mind that she would not wish to be supported for many weeks in order to return to her baseline poor health.  They feel that withdrawal of care would be the patient's desire in this situation and I am complying with their wishes.    Greater than 32 minutes was spent in the care this critically ill patient today and an end of life counseling.   11/21/2017, 9:53 AM

## 2017-11-21 NOTE — Progress Notes (Signed)
Pt extubated per one way extubation per MD order. Placed on 4L [place] per MD order for comfort . Will cont to monitor

## 2017-11-21 DEATH — deceased

## 2017-11-22 DIAGNOSIS — Z515 Encounter for palliative care: Secondary | ICD-10-CM

## 2017-11-22 NOTE — Progress Notes (Signed)
Responded to a page to support this family with prayer as they sit with their loved one who is actively dying.  Chaplain happy to provide care and prayer support to this family.  Two sons and their companions in the room at the time of this visit but the fiance' is on the way.  Very nice family who cares for their loved one.  Will pass on to the evening Chaplain so that she can provide further support as needed.  Thank you for the honor of praying with the patient and her family.    11/22/17 1431  Clinical Encounter Type  Visited With Patient and family together;Health care provider  Visit Type Follow-up;Psychological support;Spiritual support;Critical Care;Patient actively dying  Spiritual Encounters  Spiritual Needs Prayer;Emotional;Grief support

## 2017-11-22 NOTE — Progress Notes (Signed)
S: No events overnight, full comfort care at this point  O: Appears comfortable Sedate RRR Transmitted upper airway sounds Benign abdomen  A/P: 52 year old female Flu positive with AMS who was terminally extubated yesterday.  Appears comfortable.  - Transfer to palliative care floor  - Continue morphine  - Transfer care to [facility]  Alyson Reedy, M.D. [facility] Pulmonary/Critical Care Medicine. Pager: [phone]. After hours pager: [phone].

## 2017-11-22 NOTE — Progress Notes (Signed)
Pt transferred from [facility] for palliative care measures

## 2017-11-22 NOTE — Progress Notes (Signed)
Palliative Medicine RN Note: Symptom check. Patient has recently arrived on 6N. She is breathing heavy despite morphine 4mg  infusion.  Son is at bedside. We discussed symptoms r/t dying, as well as patient's life before this illness. Her son understands that she is dying, and he is sad, but he also knows that, as he says, where she's going is much better than anything this world can offer. He requested chaplain visit; I spoke to the chaplain on service today, and she will visit when she gets done with her next patient.  We discussed the use of morphine for shortness of breath and the probability that it is not bringing comfort at this time. I also spoke with the patient's POA/brother, who is in agreement.  Plan for PMT follow up tomorrow to ensure continued comfort.  Margret Chance Bufford Helms, RN, BSN, [facility] Palliative Medicine Team 11/22/2017 2:26 PM Office [phone]

## 2017-11-23 NOTE — Progress Notes (Signed)
[facility] PROGRESS NOTE  KARRIE FLUELLEN NWG:[id number] DOB: 26-May-1966 DOA: 11/15/2017 PCP: [facility] [place]  Brief summary    52 year old female with PMH of COPD, Asthma, Crohn disease, OSA, PPM, DM, Stroke, who presented to [facility] on 2/11 complaining of dyspnea nausea vomiting and diarrhea.  She was found to have dka, sepsis, pneumonia, influenza positive and required intubation and mechanical ventilation.  She was intubated until 2/21 and when she was extubated she was encephalopathic.  In addition she had an elevation of her cardiac enzymes and an echocardiogram was obtained showing an ejection fraction of 30% felt to be consistent with a Takotsubo syndrome.  She subsequently developed diffuse ST segment elevation with most pronounced elevation inferiorly were Q waves are present.  She was transferred to this hospital and on arrival required intubation for respiratory distress.  She was taken to the cardiac catheterization laboratory where she was found to have diffuse disease not requiring intervention.  Of note her LVEDP was normal at 11.  She was transferred to the intensive care unit where she is unresponsive despite receiving no sedative agents. Patient was terminally extubated per patient/family wishes. On comfort care      Assessment/Plan:  Acute respiratory failure, influenza, copd. HCAP. RLL pneumonia.  Not improved. 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 HF rEF. SSS s/p PPM. LV thrombus. Not improved  Acute encephalopathy. Not improved.  head CT neg, ammonia slight high, NO clear episode of hypoxia/ hypotension documented. Neuro input , ideally needs MRI but cannot since has PPM. consider lumbar puncture but risk thought to outweigh benefit due to thrombocytopenia. Differential diagnosis for her quadriparesis is broad.  Transverse myelitis, Guillain-Barr syndrome, neuropathy/myopathy of acute illness  are all considerations but the list is not entirely inclusive.    Patient was terminally extubated and started on comfort care after multiple discussions with intensivist, palliative care, consultants and her family/poa.   Code Status: DNR Family Communication: d/w her family, RN (indicate person spoken with, relationship, and if by phone, the number) Disposition Plan: likely less than 24-48 hrs    Consultants:  Palliative   Neurology   intensivist   Procedures:  Echo.   LHC  CTA head/neck   Antibiotics: Anti-infectives (From admission, onward)   Start     Dose/Rate Route Frequency Ordered Stop   11/19/17 1400  ceFAZolin (ANCEF) IVPB 2g/100 mL premix  Status:  Discontinued     2 g 200 mL/hr over 30 Minutes Intravenous Every 8 hours 11/19/17 1006 11/21/17 1317   11/10/2017 1100  vancomycin (VANCOCIN) IVPB 750 mg/150 ml premix  Status:  Discontinued     750 mg 150 mL/hr over 60 Minutes Intravenous Every 12 hours 11/04/2017 0938 11/18/17 1104   11/05/2017 1100  meropenem (MERREM) 1 g in sodium chloride 0.9 % 100 mL IVPB  Status:  Discontinued     1 g 200 mL/hr over 30 Minutes Intravenous Every 8 hours 11/15/2017 0938 11/19/17 0938   11/04/17 2000  levofloxacin (LEVAQUIN) IVPB 750 mg  Status:  Discontinued     750 mg 100 mL/hr over 90 Minutes Intravenous Every 24 hours 10/25/2017 2041 11/06/17 0939   11/04/17 1000  ceFEPIme (MAXIPIME) 1 g in sodium chloride 0.9 % 100 mL IVPB     1 g 200 mL/hr over 30 Minutes Intravenous Every 8 hours 11/04/17 0814 11/12/17 0243   11/04/17 0500  vancomycin (VANCOCIN) 1,250 mg in sodium chloride 0.9 % 250 mL IVPB  Status:  Discontinued     1,250 mg 166.7 mL/hr over 90 Minutes Intravenous Every 12 hours 11/06/2017 2041 11/06/17 0939   11/15/2017 2345  oseltamivir (TAMIFLU) capsule 75 mg     75 mg Oral 2 times daily 11/15/2017 2336 11/08/17 1135   11/07/2017 2000  oseltamivir (TAMIFLU) capsule 75 mg     75 mg Oral  Once 10/24/2017 1952 11/02/2017 2010   10/24/2017  1800  vancomycin (VANCOCIN) IVPB 1000 mg/200 mL premix     1,000 mg 200 mL/hr over 60 Minutes Intravenous  Once 11/15/2017 1755 10/28/2017 1957   11/15/2017 1800  levofloxacin (LEVAQUIN) IVPB 750 mg     750 mg 100 mL/hr over 90 Minutes Intravenous  Once 11/05/2017 1755 11/14/2017 1957        (indicate start date, and stop date if known)  HPI/Subjective: Non responsive. Comfortable. On iv morphine drip   Objective: Vitals:   11/22/17 1408 11/23/17 0525  BP: 107/66 125/71  Pulse: (!) 118 (!) 129  Resp: 12 13  Temp: (!) 100.8 F (38.2 C) 99 F (37.2 C)  SpO2: 96% 93%    Intake/Output Summary (Last 24 hours) at 11/23/2017 1122 Last data filed at 11/23/2017 0534 Gross per 24 hour  Intake 252.07 ml  Output 700 ml  Net -447.93 ml   Filed Weights   11/20/17 0200 11/21/17 0500 11/22/17 0300  Weight: 94.7 kg (208 lb 12.4 oz) 87.1 kg (192 lb 0.3 oz) 88 kg (194 lb 0.1 oz)    Exam:   General:  Non responsive   Data Reviewed: Basic Metabolic Panel: Recent Labs  Lab 11/17/17 0411 11/18/17 0438 11/19/17 0301 11/19/17 1508 11/20/17 0200 11/21/17 0417  NA 152* 154* 150* 152* 144 144  K 4.1 3.0* 3.2* 4.4 3.3* 3.7  CL 123* 126* 124* 123* 118* 113*  CO2 22 20* 18* 20* 20* 23  GLUCOSE 192* 328* 307* 115* 184* 273*  BUN 58* 46* 37* 35* 29* 24*  CREATININE 0.87 0.74 0.69 0.64 0.51 0.60  CALCIUM 8.7* 8.1* 7.9* 8.3* 7.9* 8.3*  MG 2.5* 2.2  --   --  1.7  --   PHOS 3.2 1.3* 2.5  --  2.3*  --    Liver Function Tests: Recent Labs  Lab 11/18/17 0438 11/19/17 0301  AST 52* 37  ALT 103* 75*  ALKPHOS 300* 251*  BILITOT 0.8 0.6  PROT 4.6* 4.5*  ALBUMIN 2.0* 2.0*   No results for input(s): LIPASE, AMYLASE in the last 168 hours. Recent Labs  Lab 11/18/17 0438 11/20/17 0200  AMMONIA 30 17   CBC: Recent Labs  Lab 11/17/17 0411 11/18/17 0438 11/19/17 0301 11/20/17 0200 11/21/17 0417  WBC 25.9* 24.1* 17.9* 18.3* 13.6*  HGB 13.9 11.3* 9.8* 10.1* 9.8*  HCT 44.6 36.1 30.5* 31.8*  30.7*  MCV 96.7 97.0 97.4 96.7 95.0  PLT 54* 41* 36* 32* 32*   Cardiac Enzymes: Recent Labs  Lab 11/18/17 0957 11/19/17 1118  CKTOTAL 125 186   BNP (last 3 results) Recent Labs    09/20/17 2346 10/24/2017 1753 11/04/17 1619  BNP 227.0* 36.0 368.0*    ProBNP (last 3 results) No results for input(s): PROBNP in the last 8760 hours.  CBG: Recent Labs  Lab 11/20/17 1955 11/20/17 2327 11/21/17 0315 11/21/17 0755 11/21/17 1155  GLUCAP 265* 265* 265* 238* 194*    Recent Results (from the past 240 hour(s))  Culture, respiratory (NON-Expectorated)     Status: None   Collection Time: 11/02/2017 10:34 AM  Result Value  Ref Range Status   Specimen Description TRACHEAL ASPIRATE  Final   Special Requests Immunocompromised  Final   Gram Stain   Final    FEW WBC PRESENT, PREDOMINANTLY PMN RARE SQUAMOUS EPITHELIAL CELLS PRESENT MODERATE GRAM POSITIVE COCCI IN CLUSTERS Performed at [facility], [address] [place], [place] [address]    Culture FEW STAPHYLOCOCCUS AUREUS  Final   Report Status 11/19/2017 FINAL  Final   Organism ID, Bacteria STAPHYLOCOCCUS AUREUS  Final      Susceptibility   Staphylococcus aureus - MIC*    CIPROFLOXACIN <=0.5 SENSITIVE Sensitive     ERYTHROMYCIN RESISTANT Resistant     GENTAMICIN <=0.5 SENSITIVE Sensitive     OXACILLIN 0.5 SENSITIVE Sensitive     TETRACYCLINE <=1 SENSITIVE Sensitive     VANCOMYCIN 1 SENSITIVE Sensitive     TRIMETH/SULFA <=10 SENSITIVE Sensitive     CLINDAMYCIN RESISTANT Resistant     RIFAMPIN <=0.5 SENSITIVE Sensitive     Inducible Clindamycin POSITIVE Resistant     * FEW STAPHYLOCOCCUS AUREUS  Culture, blood (routine x 2)     Status: None   Collection Time: 11/11/2017 12:45 PM  Result Value Ref Range Status   Specimen Description BLOOD LEFT ARM  Final   Special Requests   Final    BOTTLES DRAWN AEROBIC AND ANAEROBIC Blood Culture adequate volume   Culture   Final    NO GROWTH 5 DAYS Performed at [facility], [address], [place], [place] [address]    Report Status 11/21/2017 FINAL  Final  Culture, Urine     Status: None   Collection Time: 11/20/2017 12:52 PM  Result Value Ref Range Status   Specimen Description URINE, CATHETERIZED  Final   Special Requests Immunocompromised  Final   Culture   Final    NO GROWTH Performed at [facility], [place] [address] [place], [place] [address]    Report Status 11/17/2017 FINAL  Final  Culture, blood (routine x 2)     Status: None   Collection Time: 10/27/2017 12:53 PM  Result Value Ref Range Status   Specimen Description BLOOD LEFT ARM  Final   Special Requests   Final    BOTTLES DRAWN AEROBIC AND ANAEROBIC Blood Culture adequate volume   Culture   Final    NO GROWTH 5 DAYS Performed at [facility] [address], [place], [place] [address]    Report Status 11/21/2017 FINAL  Final  MRSA PCR Screening     Status: None   Collection Time: 11/04/2017  4:16 PM  Result Value Ref Range Status   MRSA by PCR NEGATIVE NEGATIVE Final    Comment:        The GeneXpert MRSA Assay (FDA approved for NASAL specimens only), is one component of a comprehensive MRSA colonization surveillance program. It is not intended to diagnose MRSA infection nor to guide or monitor treatment for MRSA infections. Performed at [facility], [place] [address], [place], [place] [address]   C difficile quick scan w PCR reflex     Status: None   Collection Time: 11/18/17  6:03 PM  Result Value Ref Range Status   C Diff antigen NEGATIVE NEGATIVE Final   C Diff toxin NEGATIVE NEGATIVE Final   C Diff interpretation No C. difficile detected.  Final     Studies: No results found.  Scheduled Meds: . chlorhexidine gluconate (MEDLINE KIT)  15 mL Mouth Rinse BID  .  Chlorhexidine Gluconate Cloth  6 each Topical Daily  . LORazepam  1 mg Intravenous Q4H  . mouth rinse  15 mL Mouth Rinse BID  . sodium chloride flush  10-40 mL Intracatheter Q12H  . sodium  chloride flush  3 mL Intravenous Q12H   Continuous Infusions: . sodium chloride    . sodium chloride 250 mL (11/22/17 0700)  . morphine 1 mg/ml infusion 8 mg/hr (11/23/17 0855)    Active Problems:   DM type 2 causing vascular disease ([place])   Current smoker   ESOPHAGEAL STRICTURE   Esophageal reflux   Gastroparesis   Cardiac pacemaker in situ   COPD GOLD 0 / still smoking    OSA (obstructive sleep apnea)   Diabetic neuropathy (HCC)   Dysphagia   Crohn's disease without complication (HCC)   COPD exacerbation (HCC)   Influenza A   Acute respiratory failure with hypoxia (HCC)   Hyponatremia   Hypokalemia   Pressure injury of skin   Stress-induced cardiomyopathy   Sepsis ([place])   Palliative care by specialist   Malnutrition of moderate degree   DNR (do not resuscitate)   Encephalopathy   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 If 7PM-7AM, please contact night-coverage at [email or web address], password [facility] 11/23/2017, 11:22 AM  LOS: 20 days

## 2017-11-23 NOTE — Progress Notes (Signed)
Following discussions with POA, morphine drip increased to 37ml/hr to increase comfort levels.

## 2017-11-23 NOTE — Progress Notes (Signed)
Nurse paged for end of life.  Patient nearing death in comfort care.  Visited with family, had prayer and listened to those present celebrating patient and special person she has been.  Read John 14 selections and gave them Bible to keep. Phebe Colla, Chaplain   11/23/17 1200  Clinical Encounter Type  Visited With Patient and family together  Visit Type Psychological support;Spiritual support;Critical Care  Referral From Nurse  Consult/Referral To Chaplain  Spiritual Encounters  Spiritual Needs Sacred text;Prayer;Emotional  Stress Factors  Patient Stress Factors None identified  Family Stress Factors Loss

## 2017-11-25 ENCOUNTER — Ambulatory Visit (HOSPITAL_COMMUNITY): Payer: Self-pay | Admitting: Psychiatry

## 2017-12-02 ENCOUNTER — Ambulatory Visit (HOSPITAL_COMMUNITY): Payer: Self-pay | Admitting: Psychiatry

## 2017-12-11 ENCOUNTER — Encounter: Payer: Self-pay | Admitting: Internal Medicine

## 2017-12-16 ENCOUNTER — Encounter (HOSPITAL_COMMUNITY): Payer: Self-pay

## 2017-12-22 NOTE — Progress Notes (Signed)
Nutrition Brief Note  Chart reviewed. Pt now transitioning to comfort care.  No further nutrition interventions warranted at this time.  Please re-consult as needed.   Lorita Forinash A. Kinsleigh Ludolph, RD, LDN, CDE Pager: [phone] After hours Pager: [phone]  

## 2017-12-22 NOTE — Progress Notes (Signed)
Body preparation complete, Placement notified by C. Panelo and body was just taken by funeral parlor from room. Wasted about 203 ml of morphine 250 mg in 0.9 NS in sink with C. Panelo, RN.

## 2017-12-22 NOTE — Progress Notes (Signed)
   11/21/2017 2200  Clinical Encounter Type  Visited With Patient and family together  Visit Type Initial;Psychological support;Spiritual support  Referral From Nurse  Consult/Referral To Faith community  Spiritual Encounters  Spiritual Needs Prayer;Emotional;Grief support  Stress Factors  Patient Stress Factors None identified  Family Stress Factors Exhausted;Loss;Loss of control    Chaplain paged to give emotional and spiritual support to family. When Chaplain arrived Pt was no longer living. Family at bedside. Chaplain provided support through prayer.

## 2017-12-22 NOTE — Progress Notes (Signed)
PT expired at 2040 pronounced by 2 RNs. Pt was comfort care only and a DNR. Death certificate completed and given to [place].  KJKG, NP [facility]

## 2017-12-22 NOTE — Progress Notes (Signed)
Palliative Medicine RN Note: Daily symptom check. RR 8 and unlabored at the time of my visit. She remains on 8mg  morphine IV per hour via continuous infusion. No family at bedside. At this time, no need to adjust comfort meds. If she begins to have dyspnea, PMT recommends increasing bolus dose to 5mg  and continuous rate to 10mg /hour. We will follow up tomorrow.  Margret Chance Kathryne Ramella, RN, BSN, [facility] Palliative Medicine Team 12/08/2017 1:08 PM Office [phone]

## 2017-12-22 NOTE — Death Summary Note (Signed)
DEATH SUMMARY   Patient Details  Name: [name] MRN: [id number] DOB: 1966-05-20  Admission/Discharge Information   Admit Date:  11-07-2017  Date of Death: Date of Death: 11/28/2017  Time of Death: Time of Death: 17-Jan-2039  Length of Stay: 01/16/2023  Referring Physician: [facility]) for Hospitalization  Acute hypoxemic respiratory failure   Diagnoses  Preliminary cause of death:  Secondary Diagnoses (including complications and co-morbidities):  Principal Problem:   Acute respiratory failure with hypoxia (HCC) Active Problems:   DM type 2 causing vascular disease (HCC)   Current smoker   ESOPHAGEAL STRICTURE   Esophageal reflux   Gastroparesis   Cardiac pacemaker in situ   COPD GOLD 0 / still smoking    OSA (obstructive sleep apnea)   Diabetic neuropathy (HCC)   Morbid obesity (HCC)   Dysphagia   Crohn's disease without complication (HCC)   COPD exacerbation (HCC)   Influenza A   Hyponatremia   Hypokalemia   Pressure injury of skin   Stress-induced cardiomyopathy   Sepsis (HCC)   Palliative care by specialist   Malnutrition of moderate degree   DNR (do not resuscitate)   Acute encephalopathy   Flaccid quadriplegia (HCC)   Endotracheally intubated   Terminal care   Dyspnea   Palliative care encounter   Brief Hospital Course (including significant findings, care, treatment, and services provided and events leading to death)  [name] is a 52 y.o. year old female with PMH of COPD, Asthma, Crohn disease, OSA, PPM, DM, Stroke,who presented to [facility] on 11-07-22 complaining of dyspnea, nausea, vomiting, and diarrhea. She was found tohave DKA, sepsis, pneumonia,influenza positive and required intubation and mechanical ventilation. She was intubated until 2/21 and when she was extubated, she was encephalopathic. In addition, she had an elevation of her cardiac enzymes and an echocardiogram was obtained showing an ejection  fraction of 30% felt to be consistent with Takotsubo syndrome. She subsequently developed diffuse ST segment elevation with most pronounced elevation inferiorly were Q waves. She was transferred to [facility] and on arrival required intubation for respiratory distress. She was taken to the cardiac catheterization laboratory where she was found to have diffuse disease not requiring intervention. She was transferred to the intensive care unit where she remained unresponsive despite receiving no sedative agents.Patient was terminally extubated per family wishes. Patient is now on full comfort care.    Pertinent Labs and Studies  Significant Diagnostic Studies Ct Angio Head W Or Wo Contrast  Result Date: 11/17/2017 CLINICAL DATA:  52 year old female with recent altered mental status and now worsening neuro deficit. EXAM: CT ANGIOGRAPHY HEAD AND NECK TECHNIQUE: Multidetector CT imaging of the head and neck was performed using the standard protocol during bolus administration of intravenous contrast. Multiplanar CT image reconstructions and MIPs were obtained to evaluate the vascular anatomy. Carotid stenosis measurements (when applicable) are obtained utilizing NASCET criteria, using the distal internal carotid diameter as the denominator. CONTRAST:  50 milliliters ISOVUE-370 IOPAMIDOL (ISOVUE-370) INJECTION 76% COMPARISON:  Noncontrast head CT 11/11/2017, 11/14/2017. chest CT 09/21/2017. FINDINGS: CT HEAD Brain: Normal cerebral volume. No midline shift, ventriculomegaly, mass effect, evidence of mass lesion, intracranial hemorrhage or evidence of cortically based acute infarction. Bilateral cerebral sulci appear stable since 11/14/2017, and normal. Gray-white matter differentiation is within normal limits throughout the brain. No cortical encephalomalacia identified. Calvarium and skull base: Stable and negative. Paranasal sinuses: Intubated. Left side nasoenteric tube in place. Small volume  retained  secretions in the nasopharynx. Increased fluid in the left maxillary sinus. Mild scattered fluid and/or mucosal thickening elsewhere. Mild bilateral mastoid effusions have developed since 11/14/2017, greater on the right. Tympanic cavities remain clear. Orbits: Visualized orbits and scalp soft tissues are within normal limits. CTA NECK Skeleton: Absent dentition.  No osseous abnormality identified. Upper chest: Partially visible left chest cardiac pacemaker type device. Endotracheal tube tip not included. Enteric tube courses into the esophagus. There is an 8 millimeter left upper lobe lung nodule which is new since December on series 10, image 160. Otherwise stable and negative visible lung parenchyma. No superior mediastinal lymphadenopathy. Other neck: Enteric tube and endotracheal tube. In place no neck mass or lymphadenopathy. Aortic arch: Mild aortic arch calcified atherosclerosis. Three vessel arch configuration with no great vessel origin stenosis. Right carotid system: Normal right CCA origin. Negative right CCA and right carotid bifurcation. Negative cervical right ICA. Left carotid system: Negative left CCA origin. Negative left CCA. Mild calcified plaque at the left carotid bifurcation and proximal left ICA but no stenosis. Negative cervical left ICA otherwise. Vertebral arteries: Mild calcified plaque at the right subclavian artery origin without stenosis. Normal right vertebral artery origin. Normal cervical right vertebral artery to the skull base. No proximal left subclavian artery plaque or stenosis. Normal left vertebral artery origin. The left vertebral is mildly non dominant and normal to the skull base. CTA HEAD Posterior circulation: Mild irregularity of the distal right V4 segment without stenosis. The distal left vertebral artery appears normal. Normal left PICA origin. The right AICA appears dominant. The left AICA origin is also patent. Patent basilar artery without stenosis.  Normal SCA and right PCA origins. Fetal type left PCA origin. Right posterior communicating artery is diminutive or absent. Bilateral PCA branches are within normal limits. Anterior circulation: Both ICA siphons are patent. Relative to the neck there is moderate bilateral ICA siphon calcified plaque, slightly greater on the right. No significant ICA siphon stenosis is identified. Both ophthalmic artery origins and the left posterior communicating artery origin are normal. Patent carotid termini. The right ACA A1 segment is dominant, the left is diminutive and thread-like. Normal right ACA and bilateral MCA origins. The anterior communicating artery and bilateral ACA branches appear normal. The left MCA M1 segment, bifurcation, and left MCA branches appear normal. The right MCA M1 segment, bifurcation, and right MCA branches appear normal. Venous sinuses: Appear patent on the delayed images. The left transverse and sigmoid sinus appear dominant. Anatomic variants: Fetal type left PCA origin. Dominant right and diminutive left ACA A1 segments. Delayed phase: No abnormal enhancement identified. Review of the MIP images confirms the above findings IMPRESSION: 1. Negative for emergent large vessel occlusion. Bilateral Circle-of-Willis branches appear within normal limits. 2. Minimal extracranial atherosclerosis, but moderate bilateral ICA siphon calcified plaque. Still, no significant arterial stenosis is identified in the head or neck. 3. Stable and normal CT appearance of the brain. 4. Small 8 mm left upper lobe lung nodule is new since 09/21/2017 and likely infectious or inflammatory. Electronically Signed   By: Odessa Fleming M.D.   On: 11/17/2017 16:18   Dg Chest 1 View  Result Date: 11/15/2017 CLINICAL DATA:  Nasogastric tube placement. EXAM: CHEST 1 VIEW COMPARISON:  11/13/2017 FINDINGS: Nasal/orogastric tube passes below the diaphragm well into the stomach, below the included field of view. Endotracheal tube noted  on the prior study is no longer visualized, presumably removed. There is opacity at the right lung base silhouetting the right hemidiaphragm,  increased from the prior study. This is likely atelectasis. Pneumonia is possible. Remainder of the lungs is clear. IMPRESSION: 1. Well-positioned nasal/orogastric tube. 2. Increased opacity at the right lung base consistent with atelectasis, pneumonia or a combination. Electronically Signed   By: Amie Portland M.D.   On: 11/15/2017 13:13   Dg Chest 1 View  Result Date: 11/12/2017 CLINICAL DATA:  Followup ventilator support. EXAM: CHEST 1 VIEW COMPARISON:  11/11/2017 FINDINGS: Endotracheal tube tip is 4 cm above the carina. Nasogastric tube enters the abdomen. There continues to be moderate atelectasis in both lower lungs right more than left with possible fluid overload but no frank edema. IMPRESSION: Persistent lower lobe volume loss right more than left. Electronically Signed   By: Paulina Fusi M.D.   On: 11/12/2017 08:51   Dg Chest 1 View  Result Date: 11/09/2017 CLINICAL DATA:  Respiratory failure EXAM: CHEST 1 VIEW COMPARISON:  11/08/2017 FINDINGS: Stable endotracheal tube, NG tube, and right PICC line positions. Left subclavian 2 lead pacer noted. Cardiomegaly remains with diffuse airspace process versus edema. No enlarging effusion or pneumothorax. No significant interval change in aeration. IMPRESSION: Stable cardiomegaly with diffuse airspace process versus edema. Electronically Signed   By: Judie Petit.  Shick M.D.   On: 11/09/2017 08:12   Ct Head Wo Contrast  Result Date: Nov 20, 2017 CLINICAL DATA:  Dilated pupils.  Transfer from and other hospital. EXAM: CT HEAD WITHOUT CONTRAST TECHNIQUE: Contiguous axial images were obtained from the base of the skull through the vertex without intravenous contrast. COMPARISON:  11/14/2017 FINDINGS: Brain: No evidence of acute infarction, hemorrhage, hydrocephalus, extra-axial collection or mass lesion/mass effect. Vascular: No  hyperdense vessel or unexpected calcification. Skull: Choose 1 Sinuses/Orbits: Globes and orbits are unremarkable. Mucous retention cyst with some dependent secretions in the left maxillary sinus. Remaining visualized sinuses and mastoid air cells are clear. Other: None. IMPRESSION: 1. No intracranial abnormality. No change from the recent prior exam. Electronically Signed   By: Amie Portland M.D.   On: 2017/11/20 10:15   Ct Head Wo Contrast  Result Date: 11/14/2017 CLINICAL DATA:  Altered level of consciousness. EXAM: CT HEAD WITHOUT CONTRAST TECHNIQUE: Contiguous axial images were obtained from the base of the skull through the vertex without intravenous contrast. COMPARISON:  None. FINDINGS: Brain: No evidence of acute infarction, hemorrhage, hydrocephalus, extra-axial collection or mass lesion/mass effect. Vascular: Negative for hyperdense vessel Skull: Negative Sinuses/Orbits: Retention cyst left maxillary sinus. Remaining sinuses clear. Normal orbit Other: None IMPRESSION: Negative CT of the brain Electronically Signed   By: Marlan Palau M.D.   On: 11/14/2017 11:42   Ct Angio Neck W Or Wo Contrast  Result Date: 11/17/2017 CLINICAL DATA:  52 year old female with recent altered mental status and now worsening neuro deficit. EXAM: CT ANGIOGRAPHY HEAD AND NECK TECHNIQUE: Multidetector CT imaging of the head and neck was performed using the standard protocol during bolus administration of intravenous contrast. Multiplanar CT image reconstructions and MIPs were obtained to evaluate the vascular anatomy. Carotid stenosis measurements (when applicable) are obtained utilizing NASCET criteria, using the distal internal carotid diameter as the denominator. CONTRAST:  50 milliliters ISOVUE-370 IOPAMIDOL (ISOVUE-370) INJECTION 76% COMPARISON:  Noncontrast head CT 2017-11-20, 11/14/2017. chest CT 09/21/2017. FINDINGS: CT HEAD Brain: Normal cerebral volume. No midline shift, ventriculomegaly, mass effect, evidence  of mass lesion, intracranial hemorrhage or evidence of cortically based acute infarction. Bilateral cerebral sulci appear stable since 11/14/2017, and normal. Gray-white matter differentiation is within normal limits throughout the brain. No cortical encephalomalacia identified. Calvarium  and skull base: Stable and negative. Paranasal sinuses: Intubated. Left side nasoenteric tube in place. Small volume retained secretions in the nasopharynx. Increased fluid in the left maxillary sinus. Mild scattered fluid and/or mucosal thickening elsewhere. Mild bilateral mastoid effusions have developed since 11/14/2017, greater on the right. Tympanic cavities remain clear. Orbits: Visualized orbits and scalp soft tissues are within normal limits. CTA NECK Skeleton: Absent dentition.  No osseous abnormality identified. Upper chest: Partially visible left chest cardiac pacemaker type device. Endotracheal tube tip not included. Enteric tube courses into the esophagus. There is an 8 millimeter left upper lobe lung nodule which is new since December on series 10, image 160. Otherwise stable and negative visible lung parenchyma. No superior mediastinal lymphadenopathy. Other neck: Enteric tube and endotracheal tube. In place no neck mass or lymphadenopathy. Aortic arch: Mild aortic arch calcified atherosclerosis. Three vessel arch configuration with no great vessel origin stenosis. Right carotid system: Normal right CCA origin. Negative right CCA and right carotid bifurcation. Negative cervical right ICA. Left carotid system: Negative left CCA origin. Negative left CCA. Mild calcified plaque at the left carotid bifurcation and proximal left ICA but no stenosis. Negative cervical left ICA otherwise. Vertebral arteries: Mild calcified plaque at the right subclavian artery origin without stenosis. Normal right vertebral artery origin. Normal cervical right vertebral artery to the skull base. No proximal left subclavian artery plaque or  stenosis. Normal left vertebral artery origin. The left vertebral is mildly non dominant and normal to the skull base. CTA HEAD Posterior circulation: Mild irregularity of the distal right V4 segment without stenosis. The distal left vertebral artery appears normal. Normal left PICA origin. The right AICA appears dominant. The left AICA origin is also patent. Patent basilar artery without stenosis. Normal SCA and right PCA origins. Fetal type left PCA origin. Right posterior communicating artery is diminutive or absent. Bilateral PCA branches are within normal limits. Anterior circulation: Both ICA siphons are patent. Relative to the neck there is moderate bilateral ICA siphon calcified plaque, slightly greater on the right. No significant ICA siphon stenosis is identified. Both ophthalmic artery origins and the left posterior communicating artery origin are normal. Patent carotid termini. The right ACA A1 segment is dominant, the left is diminutive and thread-like. Normal right ACA and bilateral MCA origins. The anterior communicating artery and bilateral ACA branches appear normal. The left MCA M1 segment, bifurcation, and left MCA branches appear normal. The right MCA M1 segment, bifurcation, and right MCA branches appear normal. Venous sinuses: Appear patent on the delayed images. The left transverse and sigmoid sinus appear dominant. Anatomic variants: Fetal type left PCA origin. Dominant right and diminutive left ACA A1 segments. Delayed phase: No abnormal enhancement identified. Review of the MIP images confirms the above findings IMPRESSION: 1. Negative for emergent large vessel occlusion. Bilateral Circle-of-Willis branches appear within normal limits. 2. Minimal extracranial atherosclerosis, but moderate bilateral ICA siphon calcified plaque. Still, no significant arterial stenosis is identified in the head or neck. 3. Stable and normal CT appearance of the brain. 4. Small 8 mm left upper lobe lung nodule  is new since 09/21/2017 and likely infectious or inflammatory. Electronically Signed   By: Odessa Fleming M.D.   On: 11/17/2017 16:18   Dg Chest 1v Repeat Same Day  Result Date: 11/04/2017 CLINICAL DATA:  Orogastric tube placement. EXAM: CHEST - 1 VIEW SAME DAY COMPARISON:  Chest radiograph performed earlier today at 3:20 a.m. FINDINGS: The patient's endotracheal tube is seen ending 5 cm above the  carina. An enteric tube is noted extending below the diaphragm. Vascular congestion is again noted. Bilateral airspace opacification is again noted, concerning for pneumonia, though underlying pulmonary edema may be present. No pleural effusion or pneumothorax is seen. The cardiomediastinal silhouette is borderline normal in size. A pacemaker is noted overlying the left chest wall, with leads ending overlying the right atrium and right ventricle. No acute osseous abnormalities are identified. IMPRESSION: 1. Enteric tube noted extending below the diaphragm. 2. Endotracheal tube seen ending 5 cm above the carina. 3. Vascular congestion. Persistent bilateral airspace opacification is concerning for pneumonia, though underlying pulmonary edema may be present. Electronically Signed   By: Roanna Raider M.D.   On: 11/04/2017 03:59   Dg Chest Port 1 View  Result Date: 11/20/2017 CLINICAL DATA:  Acute respiratory failure status post cardiac catheterization four days ago. Right basilar atelectasis. History of COPD. EXAM: PORTABLE CHEST 1 VIEW COMPARISON:  Portable chest x-ray of November 19, 2017 FINDINGS: The patient is rotated on today's study. The right hemidiaphragm is higher than the left. There is no focal infiltrate. The lung markings are mildly prominent at the right lung base. The heart is normal in size. The pulmonary vascularity is not engorged. The ICD is in stable position. The Dobbhoff feeding tube tip projects below the inferior margin of the image. The endotracheal tube tip lies 2.3 cm above the carina. IMPRESSION:  Allowing for differences space in positioning there has not been significant interval change in the appearance of the chest. Probable subsegmental atelectasis at the right lung base. Electronically Signed   By: David  [place] M.D.   On: 11/20/2017 07:58   Dg Chest Port 1 View  Result Date: 11/19/2017 CLINICAL DATA:  Status post intubation. EXAM: PORTABLE CHEST 1 VIEW COMPARISON:  11/19/2017 FINDINGS: The ET tube tip is above the carina. There is an enteric tube with tip well below the GE junction. Right arm PICC line tip is in the distal SVC. There is a left chest wall pacer device with leads in the right atrial appendage and right ventricle. Normal heart size. Decreased lung volumes are again noted. Atelectasis is noted within the right base. IMPRESSION: 1. Support apparatus positioned as above.  Stable from prior exam. 2. Persistent right base atelectasis. Electronically Signed   By: Signa Kell M.D.   On: 11/19/2017 11:25   Dg Chest Port 1 View  Result Date: 11/19/2017 CLINICAL DATA:  52 year old female admitted last week with influenza. Respiratory failure, intubated. EXAM: PORTABLE CHEST 1 VIEW COMPARISON:  11/18/2017 and earlier. FINDINGS: Portable AP semi upright view at 0558 hours. Stable endotracheal tube tip at the level the clavicles. Stable right PICC line. An enteric feeding tube courses to the stomach, tip not included. Stable somewhat low lung volumes. Curvilinear atelectasis at the right lung base persists but otherwise the lungs appear clear when allowing for portable technique. No pneumothorax, pulmonary edema or pleural effusion. Normal cardiac size and mediastinal contours. Stable left chest cardiac pacemaker. Stable cholecystectomy clips. Paucity bowel gas in the abdomen. IMPRESSION: 1.  Stable lines and tubes. 2. Mild right lung base atelectasis, no other acute cardiopulmonary abnormality. Electronically Signed   By: Odessa Fleming M.D.   On: 11/19/2017 10:04   Dg Chest Port 1  View  Result Date: 11/18/2017 CLINICAL DATA:  Hypoxia EXAM: PORTABLE CHEST 1 VIEW COMPARISON:  Dec 10, 2017 FINDINGS: Endotracheal tube tip is 1.1 cm above the carina. Pacemaker leads are attached to the right atrium and right ventricle.  Feeding tube tip is below the diaphragm. No pneumothorax. There is atelectatic change in both lung bases. Lungs elsewhere clear. Heart size and pulmonary vascularity are normal. No adenopathy. No bone lesions. IMPRESSION: Tube positions as described without pneumothorax. Note that the endotracheal tube tip is near the carina. It may be prudent to consider withdrawing the endotracheal tube 2-3 cm. Bibasilar atelectasis.  Lungs elsewhere clear.  Heart size normal. Electronically Signed   By: Bretta Bang III M.D.   On: 11/18/2017 09:02   Dg Chest Portable 1 View  Result Date: 11/02/2017 CLINICAL DATA:  Followup endotracheal placement EXAM: PORTABLE CHEST 1 VIEW COMPARISON:  11/15/2017 FINDINGS: Endotracheal tube tip is 2 cm above the carina. Right arm PICC tip is in the SVC 3 cm above the right atrium. Nasogastric tube enters the abdomen with the tip in the gastric fundus. Pacemaker appears the same. Upper lungs are clear. Areas of atelectasis/infiltrate in both lower lobes persist, perhaps slightly improved following intubation. IMPRESSION: Endotracheal tube well position. Persistent basilar volume loss/infiltrate, probably slightly improved following intubation. Electronically Signed   By: Paulina Fusi M.D.   On: 11/14/2017 06:35   Dg Chest Port 1 View  Result Date: 11/13/2017 CLINICAL DATA:  Hypoxia.  COPD.  Pacemaker. EXAM: PORTABLE CHEST 1 VIEW COMPARISON:  11/12/2017. FINDINGS: Patient is rotated to the right. Endotracheal tube and NG tube in stable position. Cardiac pacer noted lead tips in stable position. Heart size normal. Mild increased density noted in the right upper lung, this may be from patient rotation, right upper lobe infiltrate may be present.  Improved aeration in the lung bases. Small right pleural effusion. No pneumothorax. IMPRESSION: 1.  Lines and tubes in stable position. 2. Mild increase density noted the right upper lung. This may be related to patient rotation however right upper lobe infiltrate may be present. 3.  Improved aeration both lung bases. Electronically Signed   By: Maisie Fus  Register   On: 11/13/2017 07:16   Dg Chest Port 1 View  Result Date: 11/11/2017 CLINICAL DATA:  Respiratory failure, COPD EXAM: PORTABLE CHEST 1 VIEW COMPARISON:  11/10/2017 FINDINGS: Left pacer remains in place, unchanged. Cardiomegaly with vascular congestion. Right lower lobe airspace opacity is unchanged. Possible small right effusion. IMPRESSION: Continued right lower lobe infiltrate with small right effusion. Mild cardiomegaly, vascular congestion. Electronically Signed   By: Charlett Nose M.D.   On: 11/11/2017 08:07   Dg Chest Port 1 View  Result Date: 11/10/2017 CLINICAL DATA:  Respiratory failure EXAM: PORTABLE CHEST 1 VIEW COMPARISON:  11/09/2017 FINDINGS: Endotracheal tube in good position. NG tube in place. Dual lead pacemaker unchanged. Progression of hazy bilateral airspace disease which may represent edema. Mild bibasilar atelectasis also has progressed. Hypoventilation has progressed. IMPRESSION: Endotracheal tube in good position Hypoventilation with increased atelectasis in the bases. Progression of diffuse bilateral airspace disease suggestive of edema. Electronically Signed   By: Marlan Palau M.D.   On: 11/10/2017 08:20   Dg Chest Port 1 View  Result Date: 11/09/2017 CLINICAL DATA:  Respiratory failure. EXAM: PORTABLE CHEST 1 VIEW COMPARISON:  11/09/2017 FINDINGS: Left-sided pacemaker unchanged. Endotracheal tube unchanged. Enteric tube courses into the region of the stomach and off the inferior portion of the film. Right subclavian central venous catheter has tip over the SVC unchanged. Lungs are adequately inflated and demonstrate  mild prominence of the perihilar markings suggesting a degree of vascular congestion. No evidence of effusion. Remainder the exam is unchanged. IMPRESSION: Suggestion of mild vascular congestion. Tubes and  lines as described. Electronically Signed   By: Elberta Fortis M.D.   On: 11/09/2017 13:59   Dg Chest Port 1 View  Result Date: 11/08/2017 CLINICAL DATA:  Respiratory failure EXAM: PORTABLE CHEST 1 VIEW COMPARISON:  11/07/2017 FINDINGS: Endotracheal tube in good position. NG remains in place. Dual lead pacemaker remains in place Bilateral airspace disease unchanged.  No effusion. Right arm PICC tip in the mid SVC. IMPRESSION: Endotracheal tube remains in good position. Bilateral airspace disease unchanged. Possible edema versus pneumonia. Electronically Signed   By: Marlan Palau M.D.   On: 11/08/2017 08:23   Dg Chest Port 1 View  Result Date: 11/07/2017 CLINICAL DATA:  Respiratory failure. EXAM: PORTABLE CHEST 1 VIEW COMPARISON:  11/06/2017. 11/16/2017. 09/21/2017. CT chest 09/21/2017. FINDINGS: Endotracheal tube and NG tube in stable position. Cardiac pacer stable position. Heart size normal. Diffuse bilateral pulmonary infiltrates/edema, left side greater than right again noted. Similar findings on prior exam. Low lung volumes with basilar atelectasis. No prominent pleural effusion. No pneumothorax. IMPRESSION: 1.  Endotracheal tube and NG tube in stable position. 2.  Cardiac pacer stable position.  Heart size stable. 3. Persistent diffuse bilateral pulmonary infiltrates/edema, left side greater than right again noted. No significant change from prior exam. Low lung volumes with bibasilar atelectasis. Electronically Signed   By: Maisie Fus  Register   On: 11/07/2017 07:11   Dg Chest Port 1 View  Result Date: 11/06/2017 CLINICAL DATA:  Respiratory failure.  COPD. EXAM: PORTABLE CHEST 1 VIEW COMPARISON:  11/05/2016.  11/04/2017. FINDINGS: Endotracheal tube and NG tube in stable position. Cardiac pacer in  stable position. Heart size stable. Diffuse bilateral pulmonary infiltrates/edema, left side greater than right. Similar findings noted on prior exams. No prominent pleural effusion. No pneumothorax. IMPRESSION: 1.  Endotracheal tube and NG tube stable position. 2.  Cardiac pacer in stable position.  Heart size stable. 3. Diffuse bilateral pulmonary infiltrates/edema, left side greater than right. Similar findings noted on prior exams. Electronically Signed   By: Maisie Fus  Register   On: 11/06/2017 05:55   Portable Chest Xray  Result Date: 11/05/2017 CLINICAL DATA:  Respiratory failure.  COPD. EXAM: PORTABLE CHEST 1 VIEW COMPARISON:  Portable chest x-ray of November 12, 2017 FINDINGS: The patient is rotated on this study which limits comparison with the previous study. The lungs are well-expanded. The interstitial markings remain increased bilaterally. There are confluent infiltrates in the right perihilar and left perihilar regions which are stable. The cardiac silhouette is mildly enlarged. The pulmonary vascularity is not clearly engorged. The endotracheal tube tip lies approximately 5.1 cm above the carina. The esophagogastric tube tip in proximal port lie below the left hemidiaphragm. The ICD is grossly stable in appearance. IMPRESSION: Stable bilateral perihilar atelectasis or pneumonia. Mild pulmonary interstitial edema, stable. The support tubes are in reasonable position. Electronically Signed   By: David  [place]az[place]and M.D.   On: 11/05/2017 07:49   Dg Chest Port 1 View  Result Date: 11/04/2017 CLINICAL DATA:  Endotracheal tube placement. EXAM: PORTABLE CHEST 1 VIEW COMPARISON:  Chest radiograph performed 11/04/2017 FINDINGS: The patient's endotracheal tube is seen ending 2 cm above the carina. Bilateral airspace opacification raises concern for pneumonia. Underlying pulmonary edema may be present. No pleural effusion or pneumothorax is seen. The cardiomediastinal silhouette is borderline normal in size. A  pacemaker is noted overlying the left chest wall, with leads ending overlying the right atrium and right ventricle. No acute osseous abnormalities are seen. IMPRESSION: 1. Endotracheal tube seen ending 2 cm above  the carina. 2. Bilateral airspace opacification raises concern for pneumonia. Underlying pulmonary edema may be present. Electronically Signed   By: Roanna Raider M.D.   On: 11/04/2017 03:36   Dg Chest Port 1 View  Result Date: 11/13/2017 CLINICAL DATA:  52 year old female with a history of abdominal pain and cough EXAM: PORTABLE CHEST 1 VIEW COMPARISON:  09/21/2017 FINDINGS: Cardiomediastinal silhouette unchanged in size and contour. Fullness in the central vasculature. Coarsened interstitial markings bilaterally. No pleural effusion. No pneumothorax. Cardiac pacing device on left chest wall. IMPRESSION: Coarsened interstitial markings, may reflect atypical infection or developing edema. Cardiac pacing device on the left chest wall. Electronically Signed   By: Gilmer Mor D.O.   On: 11/13/2017 18:29   Dg Abd Portable 1v  Result Date: 11/17/2017 CLINICAL DATA:  Feeding tube placement EXAM: PORTABLE ABDOMEN - 1 VIEW COMPARISON:  CT abdomen and pelvis September 04, 2017 FINDINGS: Feeding tube tip in distal stomach. Bowel gas pattern normal. No free air or portal venous air. IMPRESSION: Feeding tube tip in distal stomach. Bowel gas pattern unremarkable. No bowel obstruction or free air evident. Electronically Signed   By: Bretta Bang III M.D.   On: 11/17/2017 12:31   US Abdomen Limited Ruq  Result Date: 11/07/2017 CLINICAL DATA:  Prior cholecystectomy. Transaminitis. Elevated liver function tests. EXAM: ULTRASOUND ABDOMEN LIMITED RIGHT UPPER QUADRANT COMPARISON:  03/25/2017 FINDINGS: Gallbladder: Surgically absent. Common bile duct: Diameter: 5 mm, within normal limits. Liver: Mildly increased echogenicity of the hepatic parenchyma, consistent with diffuse hepatocellular disease. No focal  mass lesion identified. Portal vein is patent on color Doppler imaging with normal direction of blood flow towards the liver. IMPRESSION: Prior cholecystectomy.  No evidence of biliary ductal dilatation. Mild diffuse hepatocellular disease. No focal liver lesion identified. Consider liver elastography ultrasound to evaluate for hepatic fibrosis/cirrhosis. Electronically Signed   By: Myles Rosenthal M.D.   On: 11/18/2017 17:53    Microbiology Recent Results (from the past 240 hour(s))  Culture, respiratory (NON-Expectorated)     Status: None   Collection Time: 10/26/2017 10:34 AM  Result Value Ref Range Status   Specimen Description TRACHEAL ASPIRATE  Final   Special Requests Immunocompromised  Final   Gram Stain   Final    FEW WBC PRESENT, PREDOMINANTLY PMN RARE SQUAMOUS EPITHELIAL CELLS PRESENT MODERATE GRAM POSITIVE COCCI IN CLUSTERS Performed at [facility], [address], [place], [place] [address]    Culture FEW STAPHYLOCOCCUS AUREUS  Final   Report Status 11/19/2017 FINAL  Final   Organism ID, Bacteria STAPHYLOCOCCUS AUREUS  Final      Susceptibility   Staphylococcus aureus - MIC*    CIPROFLOXACIN <=0.5 SENSITIVE Sensitive     ERYTHROMYCIN RESISTANT Resistant     GENTAMICIN <=0.5 SENSITIVE Sensitive     OXACILLIN 0.5 SENSITIVE Sensitive     TETRACYCLINE <=1 SENSITIVE Sensitive     VANCOMYCIN 1 SENSITIVE Sensitive     TRIMETH/SULFA <=10 SENSITIVE Sensitive     CLINDAMYCIN RESISTANT Resistant     RIFAMPIN <=0.5 SENSITIVE Sensitive     Inducible Clindamycin POSITIVE Resistant     * FEW STAPHYLOCOCCUS AUREUS  Culture, blood (routine x 2)     Status: None   Collection Time: 11/09/2017 12:45 PM  Result Value Ref Range Status   Specimen Description BLOOD LEFT ARM  Final   Special Requests   Final    BOTTLES DRAWN AEROBIC AND ANAEROBIC Blood Culture adequate volume   Culture   Final  NO GROWTH 5 DAYS Performed at [facility], [address] [place], [place] [address]     Report Status 11/21/2017 FINAL  Final  Culture, Urine     Status: None   Collection Time: 11/22/2017 12:52 PM  Result Value Ref Range Status   Specimen Description URINE, CATHETERIZED  Final   Special Requests Immunocompromised  Final   Culture   Final    NO GROWTH Performed at [facility], [address] [place], [place] [address]    Report Status 11/17/2017 FINAL  Final  Culture, blood (routine x 2)     Status: None   Collection Time: 2017-11-22 12:53 PM  Result Value Ref Range Status   Specimen Description BLOOD LEFT ARM  Final   Special Requests   Final    BOTTLES DRAWN AEROBIC AND ANAEROBIC Blood Culture adequate volume   Culture   Final    NO GROWTH 5 DAYS Performed at [facility], [address] [place], [place] [address]    Report Status 11/21/2017 FINAL  Final  MRSA PCR Screening     Status: None   Collection Time: 11/22/2017  4:16 PM  Result Value Ref Range Status   MRSA by PCR NEGATIVE NEGATIVE Final    Comment:        The GeneXpert MRSA Assay (FDA approved for NASAL specimens only), is one component of a comprehensive MRSA colonization surveillance program. It is not intended to diagnose MRSA infection nor to guide or monitor treatment for MRSA infections. Performed at [facility], [address] [place], [place] [address]   C difficile quick scan w PCR reflex     Status: None   Collection Time: 11/18/17  6:03 PM  Result Value Ref Range Status   C Diff antigen NEGATIVE NEGATIVE Final   C Diff toxin NEGATIVE NEGATIVE Final   C Diff interpretation No C. difficile detected.  Final    Lab Basic Metabolic Panel: Recent Labs  Lab 11/19/17 0301 11/19/17 1508 11/20/17 0200 11/21/17 0417  NA 150* 152* 144 144  K 3.2* 4.4 3.3* 3.7  CL 124* 123* 118* 113*  CO2 18* 20* 20* 23  GLUCOSE 307* 115* 184* 273*  BUN 37* 35* 29* 24*  CREATININE 0.69 0.64 0.51 0.60  CALCIUM 7.9* 8.3* 7.9* 8.3*  MG  --   --  1.7  --   PHOS 2.5  --  2.3*  --     Liver Function Tests: Recent Labs  Lab 11/19/17 0301  AST 37  ALT 75*  ALKPHOS 251*  BILITOT 0.6  PROT 4.5*  ALBUMIN 2.0*   No results for input(s): LIPASE, AMYLASE in the last 168 hours. Recent Labs  Lab 11/20/17 0200  AMMONIA 17   CBC: Recent Labs  Lab 11/19/17 0301 11/20/17 0200 11/21/17 0417  WBC 17.9* 18.3* 13.6*  HGB 9.8* 10.1* 9.8*  HCT 30.5* 31.8* 30.7*  MCV 97.4 96.7 95.0  PLT 36* 32* 32*   Cardiac Enzymes: Recent Labs  Lab 11/19/17 1118  CKTOTAL 186   Sepsis Labs: Recent Labs  Lab 11/19/17 0301 11/20/17 0200 11/21/17 0417  WBC 17.9* 18.3* 13.6*     Quanika Solem 11/25/2017, 10:23 AM

## 2017-12-22 NOTE — Progress Notes (Signed)
Patient expired at 2040 with family members at bedside. [facility] floor coverage, POA/brother, CDS notified.  Family members request presence of chaplain.  Currently, family members are still inside room with the chaplain.  Kirtland Bouchard Schorr called this RN that she will be sending someone since brother/POA who has a funeral parlor will get the body from room.  CDS informed this RN that patient is a potential tissue donor and if possible to hold the body.  Per charge RN, family do not want patient to be donor.  Will await for death certificate and family to allow [place] to clean up body.

## 2017-12-22 NOTE — Progress Notes (Signed)
PROGRESS NOTE    [name]  UTM:[id number] DOB: 10/17/1965 DOA: 10/31/2017 PCP: [facility] [place]     Brief Narrative:  [name] is a 52 year old female with PMH of COPD, Asthma, Crohn disease, OSA, PPM, DM, Stroke, who presented to [facility] on 2/11 complaining of dyspnea, nausea, vomiting, and diarrhea. She was found to have DKA, sepsis, pneumonia, influenza positive and required intubation and mechanical ventilation. She was intubated until 2/21 and when she was extubated, she was encephalopathic. In addition, she had an elevation of her cardiac enzymes and an echocardiogram was obtained showing an ejection fraction of 30% felt to be consistent with Takotsubo syndrome. She subsequently developed diffuse ST segment elevation with most pronounced elevation inferiorly were Q waves. She was transferred to [facility] and on arrival required intubation for respiratory distress. She was taken to the cardiac catheterization laboratory where she was found to have diffuse disease not requiring intervention. She was transferred to the intensive care unit where she remained unresponsive despite receiving no sedative agents. Patient was terminally extubated per family wishes. Patient is now on full comfort care.   Assessment & Plan:  Principal Problem:   Acute respiratory failure with hypoxia (HCC) Active Problems:   DM type 2 causing vascular disease ([place])   Current smoker   ESOPHAGEAL STRICTURE   Esophageal reflux   Gastroparesis   Cardiac pacemaker in situ   COPD GOLD 0 / still smoking    OSA (obstructive sleep apnea)   Diabetic neuropathy (HCC)   Morbid obesity ([place])   Dysphagia   Crohn's disease without complication (HCC)   COPD exacerbation (HCC)   Influenza A   Hyponatremia   Hypokalemia   Pressure injury of skin   Stress-induced cardiomyopathy   Sepsis ([place])   Palliative care by specialist   Malnutrition of moderate degree   DNR  (do not resuscitate)   Acute encephalopathy   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 Palliative care medicine following. Patient remains on morphine gtt. Code Status: DNR   Subjective: Patient somnolent this morning.   Objective: Vitals:   11/23/17 1328 11/23/17 2125 19-Dec-2017 0539 12/19/2017 1000  BP: 118/60 126/68 126/72 133/74  Pulse: (!) 101 96 99 (!) 105  Resp: 13     Temp:  (!) 100.8 F (38.2 C) (!) 101.1 F (38.4 C)   TempSrc:  Oral Oral   SpO2: 99% 99% 95% 97%  Weight:      Height:        Intake/Output Summary (Last 24 hours) at 19-Dec-2017 1113 Last data filed at 12/19/2017 0544 Gross per 24 hour  Intake 416.97 ml  Output 250 ml  Net 166.97 ml   Filed Weights   11/20/17 0200 11/21/17 0500 11/22/17 0300  Weight: 94.7 kg (208 lb 12.4 oz) 87.1 kg (192 lb 0.3 oz) 88 kg (194 lb 0.1 oz)    Examination:  General exam: Appears calm and comfortable  Respiratory system: Clear to auscultation. Respiratory effort normal. RR decreased  Cardiovascular system: S1 & S2 heard, RRR  Data Reviewed: I have personally reviewed following labs and imaging studies  CBC: Recent Labs  Lab 11/18/17 0438 11/19/17 0301 11/20/17 0200 11/21/17 0417  WBC 24.1* 17.9* 18.3* 13.6*  HGB 11.3* 9.8* 10.1* 9.8*  HCT 36.1 30.5* 31.8* 30.7*  MCV 97.0 97.4 96.7 95.0  PLT 41* 36* 32* 32*  Basic Metabolic Panel: Recent Labs  Lab 11/18/17 0438 11/19/17 0301 11/19/17 1508 11/20/17 0200 11/21/17 0417  NA 154* 150* 152* 144 144  K 3.0* 3.2* 4.4 3.3* 3.7  CL 126* 124* 123* 118* 113*  CO2 20* 18* 20* 20* 23  GLUCOSE 328* 307* 115* 184* 273*  BUN 46* 37* 35* 29* 24*  CREATININE 0.74 0.69 0.64 0.51 0.60  CALCIUM 8.1* 7.9* 8.3* 7.9* 8.3*  MG 2.2  --   --  1.7  --   PHOS 1.3* 2.5  --  2.3*  --    GFR: Estimated Creatinine Clearance: 89.3 mL/min (by C-G formula based on SCr of 0.6 mg/dL). Liver  Function Tests: Recent Labs  Lab 11/18/17 0438 11/19/17 0301  AST 52* 37  ALT 103* 75*  ALKPHOS 300* 251*  BILITOT 0.8 0.6  PROT 4.6* 4.5*  ALBUMIN 2.0* 2.0*   No results for input(s): LIPASE, AMYLASE in the last 168 hours. Recent Labs  Lab 11/18/17 0438 11/20/17 0200  AMMONIA 30 17   Coagulation Profile: Recent Labs  Lab 11/19/17 0301  INR 3.08   Cardiac Enzymes: Recent Labs  Lab 11/18/17 0957 11/19/17 1118  CKTOTAL 125 186   BNP (last 3 results) No results for input(s): PROBNP in the last 8760 hours. HbA1C: No results for input(s): HGBA1C in the last 72 hours. CBG: Recent Labs  Lab 11/20/17 1955 11/20/17 2327 11/21/17 0315 11/21/17 0755 11/21/17 1155  GLUCAP 265* 265* 265* 238* 194*   Lipid Profile: No results for input(s): CHOL, HDL, LDLCALC, TRIG, CHOLHDL, LDLDIRECT in the last 72 hours. Thyroid Function Tests: No results for input(s): TSH, T4TOTAL, FREET4, T3FREE, THYROIDAB in the last 72 hours. Anemia Panel: No results for input(s): VITAMINB12, FOLATE, FERRITIN, TIBC, IRON, RETICCTPCT in the last 72 hours. Sepsis Labs: Recent Labs  Lab 11/18/17 0438  PROCALCITON 0.66    Recent Results (from the past 240 hour(s))  Culture, respiratory (NON-Expectorated)     Status: None   Collection Time: 11/20/2017 10:34 AM  Result Value Ref Range Status   Specimen Description TRACHEAL ASPIRATE  Final   Special Requests Immunocompromised  Final   Gram Stain   Final    FEW WBC PRESENT, PREDOMINANTLY PMN RARE SQUAMOUS EPITHELIAL CELLS PRESENT MODERATE GRAM POSITIVE COCCI IN CLUSTERS Performed at [facility], [address] [place], [place] [address]    Culture FEW STAPHYLOCOCCUS AUREUS  Final   Report Status 11/19/2017 FINAL  Final   Organism ID, Bacteria STAPHYLOCOCCUS AUREUS  Final      Susceptibility   Staphylococcus aureus - MIC*    CIPROFLOXACIN <=0.5 SENSITIVE Sensitive     ERYTHROMYCIN RESISTANT Resistant     GENTAMICIN <=0.5 SENSITIVE  Sensitive     OXACILLIN 0.5 SENSITIVE Sensitive     TETRACYCLINE <=1 SENSITIVE Sensitive     VANCOMYCIN 1 SENSITIVE Sensitive     TRIMETH/SULFA <=10 SENSITIVE Sensitive     CLINDAMYCIN RESISTANT Resistant     RIFAMPIN <=0.5 SENSITIVE Sensitive     Inducible Clindamycin POSITIVE Resistant     * FEW STAPHYLOCOCCUS AUREUS  Culture, blood (routine x 2)     Status: None   Collection Time: 11/15/2017 12:45 PM  Result Value Ref Range Status   Specimen Description BLOOD LEFT ARM  Final   Special Requests   Final    BOTTLES DRAWN AEROBIC AND ANAEROBIC Blood Culture adequate volume   Culture   Final    NO GROWTH 5 DAYS Performed at [facility],  [address] [place], [place] [address]    Report Status 11/21/2017 FINAL  Final  Culture, Urine     Status: None   Collection Time: 11/15/2017 12:52 PM  Result Value Ref Range Status   Specimen Description URINE, CATHETERIZED  Final   Special Requests Immunocompromised  Final   Culture   Final    NO GROWTH Performed at [facility], [place] [address] [place], [place] [address]    Report Status 11/17/2017 FINAL  Final  Culture, blood (routine x 2)     Status: None   Collection Time: 11/11/2017 12:53 PM  Result Value Ref Range Status   Specimen Description BLOOD LEFT ARM  Final   Special Requests   Final    BOTTLES DRAWN AEROBIC AND ANAEROBIC Blood Culture adequate volume   Culture   Final    NO GROWTH 5 DAYS Performed at [place] [address], [place], [place] [address]    Report Status 11/21/2017 FINAL  Final  MRSA PCR Screening     Status: None   Collection Time: 11/09/2017  4:16 PM  Result Value Ref Range Status   MRSA by PCR NEGATIVE NEGATIVE Final    Comment:        The GeneXpert MRSA Assay (FDA approved for NASAL specimens only), is one component of a comprehensive MRSA colonization surveillance program. It is not intended to diagnose MRSA infection nor to guide or monitor treatment for MRSA  infections. Performed at [facility], [place] [address] [place], [place] [address]   C difficile quick scan w PCR reflex     Status: None   Collection Time: 11/18/17  6:03 PM  Result Value Ref Range Status   C Diff antigen NEGATIVE NEGATIVE Final   C Diff toxin NEGATIVE NEGATIVE Final   C Diff interpretation No C. difficile detected.  Final       Radiology Studies: No results found.    Scheduled Meds: . chlorhexidine gluconate (MEDLINE KIT)  15 mL Mouth Rinse BID  . Chlorhexidine Gluconate Cloth  6 each Topical Daily  . LORazepam  1 mg Intravenous Q4H  . mouth rinse  15 mL Mouth Rinse BID  . sodium chloride flush  10-40 mL Intracatheter Q12H  . sodium chloride flush  3 mL Intravenous Q12H   Continuous Infusions: . sodium chloride    . sodium chloride 250 mL (16-Dec-2017 0720)  . morphine 1 mg/ml infusion 8 mg/hr (11/23/17 1532)     LOS: 21 days    Time spent: 20 minutes   Dessa Phi, DO [facility] [email or web address] Password [email or web address] Dec 16, 2017, 11:13 AM

## 2017-12-22 DEATH — deceased
